# Patient Record
Sex: Male | Born: 1950 | Race: White | Hispanic: No | Marital: Single | Smoking: Former smoker
Health system: Southern US, Community
[De-identification: ages and names within clinical notes are randomized; demographics above are authoritative.]

## PROBLEM LIST (undated history)

## (undated) DIAGNOSIS — A419 Sepsis, unspecified organism: Secondary | ICD-10-CM

## (undated) DIAGNOSIS — E785 Hyperlipidemia, unspecified: Secondary | ICD-10-CM

## (undated) DIAGNOSIS — I509 Heart failure, unspecified: Secondary | ICD-10-CM

## (undated) DIAGNOSIS — R6520 Severe sepsis without septic shock: Secondary | ICD-10-CM

## (undated) DIAGNOSIS — E119 Type 2 diabetes mellitus without complications: Secondary | ICD-10-CM

## (undated) DIAGNOSIS — E870 Hyperosmolality and hypernatremia: Secondary | ICD-10-CM

## (undated) DIAGNOSIS — I10 Essential (primary) hypertension: Secondary | ICD-10-CM

## (undated) DIAGNOSIS — M1990 Unspecified osteoarthritis, unspecified site: Secondary | ICD-10-CM

## (undated) DIAGNOSIS — T07XXXA Unspecified multiple injuries, initial encounter: Secondary | ICD-10-CM

## (undated) DIAGNOSIS — M75100 Unspecified rotator cuff tear or rupture of unspecified shoulder, not specified as traumatic: Secondary | ICD-10-CM

## (undated) DIAGNOSIS — J449 Chronic obstructive pulmonary disease, unspecified: Secondary | ICD-10-CM

## (undated) HISTORY — DX: Hyperlipidemia, unspecified: E78.5

## (undated) HISTORY — DX: Essential (primary) hypertension: I10

## (undated) HISTORY — PX: OTHER SURGICAL HISTORY: SHX169

## (undated) HISTORY — DX: Heart failure, unspecified: I50.9

## (undated) HISTORY — DX: Chronic obstructive pulmonary disease, unspecified: J44.9

## (undated) HISTORY — PX: NO PAST SURGERIES: SHX2092

---

## 2008-03-22 ENCOUNTER — Emergency Department (HOSPITAL_COMMUNITY): Admission: EM | Admit: 2008-03-22 | Discharge: 2008-03-22 | Payer: Self-pay | Admitting: Emergency Medicine

## 2011-06-05 LAB — CBC
MCHC: 35.3
RDW: 14.5
WBC: 9.7

## 2011-06-05 LAB — URIC ACID: Uric Acid, Serum: 6.7

## 2011-06-05 LAB — DIFFERENTIAL
Basophils Absolute: 0.1
Basophils Relative: 1
Lymphocytes Relative: 20
Neutro Abs: 6.8
Neutrophils Relative %: 70

## 2014-07-05 ENCOUNTER — Telehealth: Payer: Self-pay

## 2014-07-16 NOTE — Telephone Encounter (Signed)
No msg °

## 2014-11-29 ENCOUNTER — Ambulatory Visit: Payer: Self-pay

## 2014-11-29 ENCOUNTER — Ambulatory Visit (INDEPENDENT_AMBULATORY_CARE_PROVIDER_SITE_OTHER): Payer: Worker's Compensation | Admitting: Emergency Medicine

## 2014-11-29 VITALS — BP 130/82 | HR 69 | Temp 98.4°F | Resp 20 | Ht 70.5 in | Wt 360.2 lb

## 2014-11-29 DIAGNOSIS — Z01818 Encounter for other preprocedural examination: Secondary | ICD-10-CM

## 2014-11-29 DIAGNOSIS — Z6841 Body Mass Index (BMI) 40.0 and over, adult: Secondary | ICD-10-CM | POA: Insufficient documentation

## 2014-11-29 DIAGNOSIS — Z720 Tobacco use: Secondary | ICD-10-CM | POA: Insufficient documentation

## 2014-11-29 LAB — POCT CBC
GRANULOCYTE PERCENT: 71.9 % (ref 37–80)
HEMATOCRIT: 56.5 % — AB (ref 43.5–53.7)
HEMOGLOBIN: 18 g/dL (ref 14.1–18.1)
LYMPH, POC: 2.1 (ref 0.6–3.4)
MCH, POC: 31.2 pg (ref 27–31.2)
MCHC: 31.9 g/dL (ref 31.8–35.4)
MCV: 98 fL — AB (ref 80–97)
MID (cbc): 0.5 (ref 0–0.9)
MPV: 7.6 fL (ref 0–99.8)
POC GRANULOCYTE: 6.7 (ref 2–6.9)
POC LYMPH PERCENT: 23 %L (ref 10–50)
POC MID %: 5.1 %M (ref 0–12)
Platelet Count, POC: 233 10*3/uL (ref 142–424)
RBC: 5.77 M/uL (ref 4.69–6.13)
RDW, POC: 17.3 %
WBC: 9.3 10*3/uL (ref 4.6–10.2)

## 2014-11-29 LAB — GLUCOSE, POCT (MANUAL RESULT ENTRY): POC GLUCOSE: 106 mg/dL — AB (ref 70–99)

## 2014-11-29 LAB — POCT GLYCOSYLATED HEMOGLOBIN (HGB A1C): HEMOGLOBIN A1C: 6.3

## 2014-11-29 NOTE — Progress Notes (Signed)
Subjective:    Patient ID: [name], male    DOB: 09/20/1950, 64 y.o.   MRN: [id number]  HPI  This is a 64 year old male here for pre-op clearance. He tore his right rotator cuff at work. He was seen here initially but then referred to [facility]. Dr. Thomasena Edis will be performing the surgery.  No PMH. He is not on any medications. He has a 44 pack year smoking history. Does not see a doctor regularly - has never had a CPE. States he does not exercise but does climb stairs a few times a day and is on his feet all day. Never gets SOB or CP. Has a baseline morning cough, coughs up mucous. No SOB or coughing throughout the day. No daytime tiredness. No snoring. No one sleeps in the bed with him. Denies LE swelling. Endorses he drinks and urinates a lot. Never been tested for DM.  Review of Systems  Constitutional: Negative for fever, chills and fatigue.  Eyes: Negative for visual disturbance.  Respiratory: Positive for cough. Negative for shortness of breath and wheezing.   Cardiovascular: Negative for chest pain, palpitations and leg swelling.  Gastrointestinal: Negative for nausea, vomiting and anal bleeding.  Endocrine: Positive for polydipsia and polyuria.  Musculoskeletal: Positive for arthralgias.  Psychiatric/Behavioral: Negative for sleep disturbance.    There are no active problems to display for this patient.  Prior to Admission medications   Medication Sig Start Date End Date Taking? Authorizing Provider  ibuprofen (ADVIL,MOTRIN) 200 MG tablet Take 200 mg by mouth every 6 (six) hours as needed.   Yes Historical Provider, MD   No Known Allergies  Patient's social and family history were reviewed.     Objective:   Physical Exam  Constitutional: He is oriented to person, place, and time. He appears well-developed and well-nourished. No distress.  HENT:  Head: Normocephalic and atraumatic.  Right Ear: Hearing normal.  Left Ear: Hearing normal.  Nose: Nose  normal.  Mouth/Throat: Uvula is midline, oropharynx is clear and moist and mucous membranes are normal.  Eyes: Conjunctivae and lids are normal. Right eye exhibits no discharge. Left eye exhibits no discharge. No scleral icterus.  Cardiovascular: Normal rate, regular rhythm, normal heart sounds and normal pulses.   Pulmonary/Chest: Effort normal and breath sounds normal. No respiratory distress. He has no wheezes. He has no rhonchi. He has no rales.  Abdominal: Soft. Normal appearance. There is no tenderness.  Musculoskeletal: Normal range of motion.  Lymphadenopathy:    He has no cervical adenopathy.  Neurological: He is alert and oriented to person, place, and time. Gait normal.  Skin: Skin is warm, dry and intact.  Large pedunculated lesion on left lower back.  2+ pitting edema in bilateral LE up to below knee  Severe onychomycosis and toenail overgrowth  Psychiatric: He has a normal mood and affect. His speech is normal and behavior is normal. Thought content normal.   BP 130/82 mmHg  Pulse 69  Temp(Src) 98.4 F (36.9 C) (Oral)  Resp 20  Ht 5' 10.5" (1.791 m)  Wt 360 lb 3.2 oz (163.386 kg)  BMI 50.94 kg/m2  SpO2 93%  Results for orders placed or performed in visit on 11/29/14  POCT CBC  Result Value Ref Range   WBC 9.3 4.6 - 10.2 K/uL   Lymph, poc 2.1 0.6 - 3.4   POC LYMPH PERCENT 23.0 10 - 50 %L   MID (cbc) 0.5 0 - 0.9   POC MID %  5.1 0 - 12 %M   POC Granulocyte 6.7 2 - 6.9   Granulocyte percent 71.9 37 - 80 %G   RBC 5.77 4.69 - 6.13 M/uL   Hemoglobin 18.0 14.1 - 18.1 g/dL   HCT, POC 16.156.5 (A) 09.643.5 - 53.7 %   MCV 98.0 (A) 80 - 97 fL   MCH, POC 31.2 27 - 31.2 pg   MCHC 31.9 31.8 - 35.4 g/dL   RDW, POC 04.517.3 %   Platelet Count, POC 233 142 - 424 K/uL   MPV 7.6 0 - 99.8 fL  POCT glycosylated hemoglobin (Hb A1C)  Result Value Ref Range   Hemoglobin A1C 6.3   POCT glucose (manual entry)  Result Value Ref Range   POC Glucose 106 (A) 70 - 99 mg/dl   EKG interpreted  with Dr. Dareen PianoAnderson: NSR with right ventricular hypertrophy suggestive of pulmonary disease.     Assessment & Plan:  1. Pre-operative clearance Hgb A1C borderline at 6.3. CBC wnl. Glucose 106. CMP pending. EKG without evidence of ischemia. He has a 44 pack year smoking history. He does not have CP or SOB with exertion although his exertion is limited to walking up stairs at work. BP not elevated. He is cleared for surgery with respects to EKG and labs performed today. Discussed case with Dr. Dareen PianoAnderson and he agrees. - POCT CBC - POCT glycosylated hemoglobin (Hb A1C) - POCT glucose (manual entry) - EKG 12-Lead   Roswell MinersNicole V. Dyke BrackettBush, PA-C, MHS [facility]  11/29/2014

## 2014-11-29 NOTE — Progress Notes (Signed)
  Medical screening examination/treatment/procedure(s) were performed by non-physician practitioner and as supervising physician I was immediately available for consultation/collaboration.    Suggest patient undergo colonoscopy

## 2014-12-19 NOTE — Progress Notes (Signed)
Please put orders in Epic surgery 12-28-14 pre op 12-25-14 Thanks 

## 2014-12-20 ENCOUNTER — Other Ambulatory Visit: Payer: Self-pay | Admitting: Orthopedic Surgery

## 2014-12-24 NOTE — Patient Instructions (Addendum)
YOUR PROCEDURE IS SCHEDULED ON : 12/28/14  REPORT TO [facility] MAIN ENTRANCE FOLLOW SIGNS TO SHORT STAY CENTER AT :  11:00 AM  CALL THIS NUMBER IF YOU HAVE PROBLEMS THE MORNING OF SURGERY [phone]  REMEMBER:  DO NOT EAT FOOD  AFTER MIDNIGHT MAY HAVE CLEAR LIQUIDS UNTIL 7:00 AM  CLEAR LIQUID DIET   Foods Allowed                                                                     Foods Excluded  Coffee and tea, regular and decaf                             liquids that you cannot  Plain Jell-O in any flavor                                             see through such as: Fruit ices (not with fruit pulp)                                     milk, soups, orange juice  Iced Popsicles                                                All solid food Carbonated beverages, regular and diet                                    Cranberry, grape and apple juices Sports drinks like Gatorade Lightly seasoned clear broth or consume(fat free) Sugar, honey syrup   TAKE THESE MEDICINES THE MORNING OF SURGERY: none  YOU MAY NOT HAVE ANY METAL ON YOUR BODY INCLUDING HAIR PINS AND PIERCING'S. DO NOT WEAR JEWELRY, MAKEUP, LOTIONS, POWDERS OR PERFUMES. DO NOT WEAR NAIL POLISH. DO NOT SHAVE 48 HRS PRIOR TO SURGERY. MEN MAY SHAVE FACE AND NECK.  DO NOT BRING VALUABLES TO HOSPITAL. [facility] IS NOT RESPONSIBLE FOR VALUABLES.  CONTACTS, DENTURES OR PARTIALS MAY NOT BE WORN TO SURGERY. LEAVE SUITCASE IN CAR. CAN BE BROUGHT TO ROOM AFTER SURGERY.  PATIENTS DISCHARGED THE DAY OF SURGERY WILL NOT BE ALLOWED TO DRIVE HOME.  PLEASE READ OVER THE FOLLOWING INSTRUCTION SHEETS _________________________________________________________________________________                                          [facility] - PREPARING FOR SURGERY  Before surgery, you can play an important role.  Because skin is not sterile, your skin needs to be as free of germs as possible.  You can reduce the  number of germs on your skin by washing with CHG (chlorahexidine gluconate) soap before surgery.  CHG is an antiseptic cleaner  which kills germs and bonds with the skin to continue killing germs even after washing. Please DO NOT use if you have an allergy to CHG or antibacterial soaps.  If your skin becomes reddened/irritated stop using the CHG and inform your nurse when you arrive at Short Stay. Do not shave (including legs and underarms) for at least 48 hours prior to the first CHG shower.  You may shave your face. Please follow these instructions carefully:   1.  Shower with CHG Soap the night before surgery and the  morning of Surgery.   2.  If you choose to wash your hair, wash your hair first as usual with your  normal  Shampoo.   3.  After you shampoo, rinse your hair and body thoroughly to remove the  shampoo.                                         4.  Use CHG as you would any other liquid soap.  You can apply chg directly  to the skin and wash . Gently wash with scrungie or clean wascloth    5.  Apply the CHG Soap to your body ONLY FROM THE NECK DOWN.   Do not use on open                           Wound or open sores. Avoid contact with eyes, ears mouth and genitals (private parts).                        Genitals (private parts) with your normal soap.              6.  Wash thoroughly, paying special attention to the area where your surgery  will be performed.   7.  Thoroughly rinse your body with warm water from the neck down.   8.  DO NOT shower/wash with your normal soap after using and rinsing off  the CHG Soap .                9.  Pat yourself dry with a clean towel.             10.  Wear clean night clothes to bed after shower             11.  Place clean sheets on your bed the night of your first shower and do not  sleep with pets.  Day of Surgery : Do not apply any lotions/deodorants the morning of surgery.  Please wear clean clothes to the hospital/surgery  center.  FAILURE TO FOLLOW THESE INSTRUCTIONS MAY RESULT IN THE CANCELLATION OF YOUR SURGERY    PATIENT SIGNATURE_________________________________  ______________________________________________________________________   [name]  An incentive spirometer is a tool that can help keep your lungs clear and active. This tool measures how well you are filling your lungs with each breath. Taking long deep breaths may help reverse or decrease the chance of developing breathing (pulmonary) problems (especially infection) following:  A long period of time when you are unable to move or be active. BEFORE THE PROCEDURE   If the spirometer includes an indicator to show your best effort, your nurse or respiratory therapist will set it to a desired goal.  If possible, sit up straight or lean slightly forward. Try not to  slouch.  Hold the incentive spirometer in an upright position. INSTRUCTIONS FOR USE  1. Sit on the edge of your bed if possible, or sit up as far as you can in bed or on a chair. 2. Hold the incentive spirometer in an upright position. 3. Breathe out normally. 4. Place the mouthpiece in your mouth and seal your lips tightly around it. 5. Breathe in slowly and as deeply as possible, raising the piston or the ball toward the top of the column. 6. Hold your breath for 3-5 seconds or for as long as possible. Allow the piston or ball to fall to the bottom of the column. 7. Remove the mouthpiece from your mouth and breathe out normally. 8. Rest for a few seconds and repeat Steps 1 through 7 at least 10 times every 1-2 hours when you are awake. Take your time and take a few normal breaths between deep breaths. 9. The spirometer may include an indicator to show your best effort. Use the indicator as a goal to work toward during each repetition. 10. After each set of 10 deep breaths, practice coughing to be sure your lungs are clear. If you have an incision (the cut made at the  time of surgery), support your incision when coughing by placing a pillow or rolled up towels firmly against it. Once you are able to get out of bed, walk around indoors and cough well. You may stop using the incentive spirometer when instructed by your caregiver.  RISKS AND COMPLICATIONS  Take your time so you do not get dizzy or light-headed.  If you are in pain, you may need to take or ask for pain medication before doing incentive spirometry. It is harder to take a deep breath if you are having pain. AFTER USE  Rest and breathe slowly and easily.  It can be helpful to keep track of a log of your progress. Your caregiver can provide you with a simple table to help with this. If you are using the spirometer at home, follow these instructions: SEEK MEDICAL CARE IF:   You are having difficultly using the spirometer.  You have trouble using the spirometer as often as instructed.  Your pain medication is not giving enough relief while using the spirometer.  You develop fever of 100.5 F (38.1 C) or higher. SEEK IMMEDIATE MEDICAL CARE IF:   You cough up bloody sputum that had not been present before.  You develop fever of 102 F (38.9 C) or greater.  You develop worsening pain at or near the incision site. MAKE SURE YOU:   Understand these instructions.  Will watch your condition.  Will get help right away if you are not doing well or get worse. Document Released: 01/04/2007 Document Revised: 11/16/2011 Document Reviewed: 03/07/2007 [facility] Patient Information 2014 [place], [place].   ________________________________________________________________________

## 2014-12-25 ENCOUNTER — Encounter (HOSPITAL_COMMUNITY): Payer: Self-pay

## 2014-12-25 ENCOUNTER — Encounter (HOSPITAL_COMMUNITY)
Admission: RE | Admit: 2014-12-25 | Discharge: 2014-12-25 | Disposition: A | Discharge status: Home / Self Care | Payer: Worker's Compensation | Source: Ambulatory Visit | Attending: Specialist | Admitting: Specialist

## 2014-12-25 DIAGNOSIS — M19011 Primary osteoarthritis, right shoulder: Secondary | ICD-10-CM | POA: Insufficient documentation

## 2014-12-25 DIAGNOSIS — Z01818 Encounter for other preprocedural examination: Secondary | ICD-10-CM | POA: Insufficient documentation

## 2014-12-25 HISTORY — DX: Unspecified rotator cuff tear or rupture of unspecified shoulder, not specified as traumatic: M75.100

## 2014-12-25 HISTORY — DX: Unspecified osteoarthritis, unspecified site: M19.90

## 2014-12-25 HISTORY — DX: Unspecified multiple injuries, initial encounter: T07.XXXA

## 2014-12-25 LAB — CBC
HEMATOCRIT: 54.2 % — AB (ref 39.0–52.0)
HEMOGLOBIN: 17.2 g/dL — AB (ref 13.0–17.0)
MCH: 31.6 pg (ref 26.0–34.0)
MCHC: 31.7 g/dL (ref 30.0–36.0)
MCV: 99.6 fL (ref 78.0–100.0)
PLATELETS: 179 10*3/uL (ref 150–400)
RBC: 5.44 MIL/uL (ref 4.22–5.81)
RDW: 15.8 % — AB (ref 11.5–15.5)
WBC: 9.8 10*3/uL (ref 4.0–10.5)

## 2014-12-25 LAB — BASIC METABOLIC PANEL
Anion gap: 6 (ref 5–15)
BUN: 11 mg/dL (ref 6–23)
CHLORIDE: 103 mmol/L (ref 96–112)
CO2: 32 mmol/L (ref 19–32)
CREATININE: 0.87 mg/dL (ref 0.50–1.35)
Calcium: 8.8 mg/dL (ref 8.4–10.5)
GFR calc non Af Amer: >90 mL/min (ref >90)
Glucose, Bld: 179 mg/dL — ABNORMAL HIGH (ref 70–99)
Potassium: 4.1 mmol/L (ref 3.5–5.1)
Sodium: 141 mmol/L (ref 135–145)

## 2014-12-25 NOTE — Progress Notes (Signed)
   12/25/14 1415  OBSTRUCTIVE SLEEP APNEA  Have you ever been diagnosed with sleep apnea through a sleep study? No  Do you snore loudly (loud enough to be heard through closed doors)?  0  Do you often feel tired, fatigued, or sleepy during the daytime? 0  Has anyone observed you stop breathing during your sleep? 0  Do you have, or are you being treated for high blood pressure? 0  BMI more than 35 kg/m2? 1  Age over 18110 years old? 1  Neck circumference greater than 40 cm/16 inches? 1  Gender: 1  Obstructive Sleep Apnea Score 4

## 2014-12-27 MED ORDER — CEFAZOLIN SODIUM 10 G IJ SOLR
3.0000 g | INTRAMUSCULAR | Status: AC
  Administered 2014-12-28: 3 g via INTRAVENOUS
  Filled 2014-12-27: qty 3000

## 2014-12-28 ENCOUNTER — Inpatient Hospital Stay (HOSPITAL_COMMUNITY): Payer: Worker's Compensation

## 2014-12-28 ENCOUNTER — Ambulatory Visit (HOSPITAL_COMMUNITY): Payer: Worker's Compensation | Admitting: Certified Registered Nurse Anesthetist

## 2014-12-28 ENCOUNTER — Encounter (HOSPITAL_COMMUNITY)
Admission: RE | Disposition: A | Discharge status: Nursing Home (Includes ICF) | Payer: Self-pay | Source: Ambulatory Visit | Attending: Pulmonary Disease

## 2014-12-28 ENCOUNTER — Encounter (HOSPITAL_COMMUNITY): Payer: Self-pay | Admitting: *Deleted

## 2014-12-28 ENCOUNTER — Inpatient Hospital Stay (HOSPITAL_COMMUNITY)
Admission: RE | Admit: 2014-12-28 | Discharge: 2015-01-19 | DRG: 003 | Disposition: A | Discharge status: Other Acute Inpatient Hospital | Payer: Worker's Compensation | Source: Ambulatory Visit | Attending: Pulmonary Disease | Admitting: Pulmonary Disease

## 2014-12-28 DIAGNOSIS — I82433 Acute embolism and thrombosis of popliteal vein, bilateral: Secondary | ICD-10-CM

## 2014-12-28 DIAGNOSIS — Z930 Tracheostomy status: Secondary | ICD-10-CM

## 2014-12-28 DIAGNOSIS — R1110 Vomiting, unspecified: Secondary | ICD-10-CM

## 2014-12-28 DIAGNOSIS — I82443 Acute embolism and thrombosis of tibial vein, bilateral: Secondary | ICD-10-CM

## 2014-12-28 DIAGNOSIS — G9341 Metabolic encephalopathy: Secondary | ICD-10-CM | POA: Diagnosis not present

## 2014-12-28 DIAGNOSIS — N179 Acute kidney failure, unspecified: Secondary | ICD-10-CM | POA: Diagnosis not present

## 2014-12-28 DIAGNOSIS — I472 Ventricular tachycardia: Secondary | ICD-10-CM | POA: Diagnosis not present

## 2014-12-28 DIAGNOSIS — Z791 Long term (current) use of non-steroidal anti-inflammatories (NSAID): Secondary | ICD-10-CM | POA: Diagnosis not present

## 2014-12-28 DIAGNOSIS — I82413 Acute embolism and thrombosis of femoral vein, bilateral: Secondary | ICD-10-CM

## 2014-12-28 DIAGNOSIS — J384 Edema of larynx: Secondary | ICD-10-CM | POA: Diagnosis not present

## 2014-12-28 DIAGNOSIS — J95821 Acute postprocedural respiratory failure: Secondary | ICD-10-CM | POA: Diagnosis not present

## 2014-12-28 DIAGNOSIS — J9690 Respiratory failure, unspecified, unspecified whether with hypoxia or hypercapnia: Secondary | ICD-10-CM

## 2014-12-28 DIAGNOSIS — Z9889 Other specified postprocedural states: Secondary | ICD-10-CM | POA: Diagnosis not present

## 2014-12-28 DIAGNOSIS — J189 Pneumonia, unspecified organism: Secondary | ICD-10-CM | POA: Diagnosis not present

## 2014-12-28 DIAGNOSIS — I82403 Acute embolism and thrombosis of unspecified deep veins of lower extremity, bilateral: Secondary | ICD-10-CM

## 2014-12-28 DIAGNOSIS — Z430 Encounter for attention to tracheostomy: Secondary | ICD-10-CM | POA: Insufficient documentation

## 2014-12-28 DIAGNOSIS — I5032 Chronic diastolic (congestive) heart failure: Secondary | ICD-10-CM | POA: Diagnosis present

## 2014-12-28 DIAGNOSIS — S46111A Strain of muscle, fascia and tendon of long head of biceps, right arm, initial encounter: Secondary | ICD-10-CM | POA: Diagnosis present

## 2014-12-28 DIAGNOSIS — F17210 Nicotine dependence, cigarettes, uncomplicated: Secondary | ICD-10-CM | POA: Diagnosis present

## 2014-12-28 DIAGNOSIS — E876 Hypokalemia: Secondary | ICD-10-CM | POA: Diagnosis not present

## 2014-12-28 DIAGNOSIS — M25511 Pain in right shoulder: Secondary | ICD-10-CM | POA: Diagnosis present

## 2014-12-28 DIAGNOSIS — R1310 Dysphagia, unspecified: Secondary | ICD-10-CM | POA: Diagnosis not present

## 2014-12-28 DIAGNOSIS — I10 Essential (primary) hypertension: Secondary | ICD-10-CM

## 2014-12-28 DIAGNOSIS — J9811 Atelectasis: Secondary | ICD-10-CM | POA: Diagnosis not present

## 2014-12-28 DIAGNOSIS — Y838 Other surgical procedures as the cause of abnormal reaction of the patient, or of later complication, without mention of misadventure at the time of the procedure: Secondary | ICD-10-CM | POA: Diagnosis not present

## 2014-12-28 DIAGNOSIS — S43421A Sprain of right rotator cuff capsule, initial encounter: Principal | ICD-10-CM | POA: Diagnosis present

## 2014-12-28 DIAGNOSIS — T884XXA Failed or difficult intubation, initial encounter: Secondary | ICD-10-CM

## 2014-12-28 DIAGNOSIS — E870 Hyperosmolality and hypernatremia: Secondary | ICD-10-CM | POA: Diagnosis not present

## 2014-12-28 DIAGNOSIS — Y92239 Unspecified place in hospital as the place of occurrence of the external cause: Secondary | ICD-10-CM

## 2014-12-28 DIAGNOSIS — E872 Acidosis: Secondary | ICD-10-CM | POA: Diagnosis not present

## 2014-12-28 DIAGNOSIS — E781 Pure hyperglyceridemia: Secondary | ICD-10-CM | POA: Diagnosis present

## 2014-12-28 DIAGNOSIS — D649 Anemia, unspecified: Secondary | ICD-10-CM | POA: Diagnosis present

## 2014-12-28 DIAGNOSIS — J9601 Acute respiratory failure with hypoxia: Secondary | ICD-10-CM | POA: Diagnosis not present

## 2014-12-28 DIAGNOSIS — A419 Sepsis, unspecified organism: Secondary | ICD-10-CM | POA: Diagnosis not present

## 2014-12-28 DIAGNOSIS — M66811 Spontaneous rupture of other tendons, right shoulder: Secondary | ICD-10-CM | POA: Diagnosis present

## 2014-12-28 DIAGNOSIS — Z23 Encounter for immunization: Secondary | ICD-10-CM

## 2014-12-28 DIAGNOSIS — M1990 Unspecified osteoarthritis, unspecified site: Secondary | ICD-10-CM | POA: Diagnosis present

## 2014-12-28 DIAGNOSIS — Z79891 Long term (current) use of opiate analgesic: Secondary | ICD-10-CM | POA: Diagnosis not present

## 2014-12-28 DIAGNOSIS — D7589 Other specified diseases of blood and blood-forming organs: Secondary | ICD-10-CM | POA: Diagnosis present

## 2014-12-28 DIAGNOSIS — E875 Hyperkalemia: Secondary | ICD-10-CM | POA: Diagnosis not present

## 2014-12-28 DIAGNOSIS — D751 Secondary polycythemia: Secondary | ICD-10-CM | POA: Diagnosis present

## 2014-12-28 DIAGNOSIS — I248 Other forms of acute ischemic heart disease: Secondary | ICD-10-CM | POA: Diagnosis present

## 2014-12-28 DIAGNOSIS — Z4659 Encounter for fitting and adjustment of other gastrointestinal appliance and device: Secondary | ICD-10-CM

## 2014-12-28 DIAGNOSIS — Z6841 Body Mass Index (BMI) 40.0 and over, adult: Secondary | ICD-10-CM

## 2014-12-28 DIAGNOSIS — Y95 Nosocomial condition: Secondary | ICD-10-CM | POA: Diagnosis not present

## 2014-12-28 DIAGNOSIS — M7989 Other specified soft tissue disorders: Secondary | ICD-10-CM | POA: Diagnosis not present

## 2014-12-28 DIAGNOSIS — E662 Morbid (severe) obesity with alveolar hypoventilation: Secondary | ICD-10-CM | POA: Diagnosis present

## 2014-12-28 DIAGNOSIS — I5031 Acute diastolic (congestive) heart failure: Secondary | ICD-10-CM | POA: Diagnosis not present

## 2014-12-28 DIAGNOSIS — I160 Hypertensive urgency: Secondary | ICD-10-CM | POA: Diagnosis present

## 2014-12-28 DIAGNOSIS — R58 Hemorrhage, not elsewhere classified: Secondary | ICD-10-CM

## 2014-12-28 DIAGNOSIS — R6521 Severe sepsis with septic shock: Secondary | ICD-10-CM | POA: Diagnosis not present

## 2014-12-28 DIAGNOSIS — I509 Heart failure, unspecified: Secondary | ICD-10-CM | POA: Diagnosis not present

## 2014-12-28 DIAGNOSIS — G9340 Encephalopathy, unspecified: Secondary | ICD-10-CM | POA: Diagnosis present

## 2014-12-28 DIAGNOSIS — J9600 Acute respiratory failure, unspecified whether with hypoxia or hypercapnia: Secondary | ICD-10-CM | POA: Diagnosis present

## 2014-12-28 DIAGNOSIS — N170 Acute kidney failure with tubular necrosis: Secondary | ICD-10-CM | POA: Diagnosis not present

## 2014-12-28 DIAGNOSIS — W19XXXA Unspecified fall, initial encounter: Secondary | ICD-10-CM | POA: Diagnosis present

## 2014-12-28 DIAGNOSIS — N186 End stage renal disease: Secondary | ICD-10-CM

## 2014-12-28 DIAGNOSIS — N508 Other specified disorders of male genital organs: Secondary | ICD-10-CM | POA: Diagnosis not present

## 2014-12-28 DIAGNOSIS — J449 Chronic obstructive pulmonary disease, unspecified: Secondary | ICD-10-CM | POA: Diagnosis present

## 2014-12-28 DIAGNOSIS — K5900 Constipation, unspecified: Secondary | ICD-10-CM | POA: Diagnosis not present

## 2014-12-28 DIAGNOSIS — N5089 Other specified disorders of the male genital organs: Secondary | ICD-10-CM

## 2014-12-28 DIAGNOSIS — Z992 Dependence on renal dialysis: Secondary | ICD-10-CM

## 2014-12-28 DIAGNOSIS — I82509 Chronic embolism and thrombosis of unspecified deep veins of unspecified lower extremity: Secondary | ICD-10-CM | POA: Insufficient documentation

## 2014-12-28 DIAGNOSIS — J9602 Acute respiratory failure with hypercapnia: Secondary | ICD-10-CM | POA: Diagnosis not present

## 2014-12-28 DIAGNOSIS — J95830 Postprocedural hemorrhage and hematoma of a respiratory system organ or structure following a respiratory system procedure: Secondary | ICD-10-CM | POA: Diagnosis not present

## 2014-12-28 DIAGNOSIS — R739 Hyperglycemia, unspecified: Secondary | ICD-10-CM | POA: Diagnosis not present

## 2014-12-28 DIAGNOSIS — Z720 Tobacco use: Secondary | ICD-10-CM

## 2014-12-28 DIAGNOSIS — R0902 Hypoxemia: Secondary | ICD-10-CM

## 2014-12-28 HISTORY — DX: Morbid (severe) obesity due to excess calories: E66.01

## 2014-12-28 HISTORY — PX: SHOULDER ARTHROSCOPY WITH SUBACROMIAL DECOMPRESSION, ROTATOR CUFF REPAIR AND BICEP TENDON REPAIR: SHX5687

## 2014-12-28 LAB — BLOOD GAS, ARTERIAL
Acid-base deficit: 2.4 mmol/L — ABNORMAL HIGH (ref 0.0–2.0)
Bicarbonate: 28.8 mEq/L — ABNORMAL HIGH (ref 20.0–24.0)
Delivery systems: POSITIVE
Drawn by: 235321
Expiratory PAP: 8
FIO2: 1 %
Inspiratory PAP: 14
Mode: POSITIVE
O2 Saturation: 93.3 %
Patient temperature: 98.1
TCO2: 25.9 mmol/L (ref 0–100)
pCO2 arterial: 77.2 mmHg (ref 35.0–45.0)
pH, Arterial: 7.194 — CL (ref 7.350–7.450)
pO2, Arterial: 76.3 mmHg — ABNORMAL LOW (ref 80.0–100.0)

## 2014-12-28 LAB — BRAIN NATRIURETIC PEPTIDE: B Natriuretic Peptide: 55.7 pg/mL (ref 0.0–100.0)

## 2014-12-28 LAB — TROPONIN I: Troponin I: 0.05 ng/mL — ABNORMAL HIGH (ref <0.031)

## 2014-12-28 LAB — GLUCOSE, CAPILLARY: GLUCOSE-CAPILLARY: 171 mg/dL — AB (ref 70–99)

## 2014-12-28 SURGERY — SHOULDER ARTHROSCOPY WITH SUBACROMIAL DECOMPRESSION, ROTATOR CUFF REPAIR AND BICEP TENDON REPAIR
Anesthesia: General | Site: Shoulder | Laterality: Right

## 2014-12-28 MED ORDER — OXYCODONE-ACETAMINOPHEN 5-325 MG PO TABS: 1.0000 | ORAL_TABLET | ORAL | Status: DC | PRN

## 2014-12-28 MED ORDER — SUCCINYLCHOLINE CHLORIDE 20 MG/ML IJ SOLN
INTRAMUSCULAR | Status: DC | PRN
  Administered 2014-12-28: 160 mg via INTRAVENOUS

## 2014-12-28 MED ORDER — PROPOFOL 1000 MG/100ML IV EMUL
INTRAVENOUS | Status: AC
  Filled 2014-12-28: qty 100

## 2014-12-28 MED ORDER — OXYCODONE HCL 5 MG PO TABS: 5.0000 mg | ORAL_TABLET | ORAL | Status: DC | PRN

## 2014-12-28 MED ORDER — LACTATED RINGERS IV SOLN: INTRAVENOUS | Status: DC

## 2014-12-28 MED ORDER — ROPIVACAINE HCL 5 MG/ML IJ SOLN
INTRAMUSCULAR | Status: DC | PRN
Start: 2014-12-28 — End: 2014-12-28
  Administered 2014-12-28: 30 mL via PERINEURAL

## 2014-12-28 MED ORDER — ROCURONIUM BROMIDE 100 MG/10ML IV SOLN
INTRAVENOUS | Status: AC
  Filled 2014-12-28: qty 1

## 2014-12-28 MED ORDER — METHOCARBAMOL 1000 MG/10ML IJ SOLN
500.0000 mg | Freq: Four times a day (QID) | INTRAVENOUS | Status: DC | PRN
  Filled 2014-12-28: qty 5

## 2014-12-28 MED ORDER — FENTANYL CITRATE (PF) 100 MCG/2ML IJ SOLN
INTRAMUSCULAR | Status: DC | PRN
  Administered 2014-12-28: 50 ug via INTRAVENOUS

## 2014-12-28 MED ORDER — INSULIN ASPART 100 UNIT/ML ~~LOC~~ SOLN
0.0000 [IU] | SUBCUTANEOUS | Status: DC
  Administered 2014-12-28: 2 [IU] via SUBCUTANEOUS
  Administered 2014-12-29: 3 [IU] via SUBCUTANEOUS
  Administered 2014-12-29 (×2): 2 [IU] via SUBCUTANEOUS

## 2014-12-28 MED ORDER — RACEPINEPHRINE HCL 2.25 % IN NEBU
0.5000 mL | INHALATION_SOLUTION | Freq: Once | RESPIRATORY_TRACT | Status: AC
  Administered 2014-12-28: 0.5 mL via RESPIRATORY_TRACT
  Filled 2014-12-28: qty 0.5

## 2014-12-28 MED ORDER — ASPIRIN EC 325 MG PO TBEC
325.0000 mg | DELAYED_RELEASE_TABLET | Freq: Every day | ORAL | Status: DC
Start: 2014-12-29 — End: 2014-12-29

## 2014-12-28 MED ORDER — HALOPERIDOL LACTATE 5 MG/ML IJ SOLN
5.0000 mg | Freq: Once | INTRAMUSCULAR | Status: AC
  Administered 2014-12-28: 5 mg via INTRAVENOUS

## 2014-12-28 MED ORDER — FENTANYL CITRATE (PF) 100 MCG/2ML IJ SOLN
INTRAMUSCULAR | Status: AC
  Filled 2014-12-28: qty 2

## 2014-12-28 MED ORDER — CEPHALEXIN 500 MG PO CAPS: 500.0000 mg | ORAL_CAPSULE | Freq: Four times a day (QID) | ORAL | Status: DC

## 2014-12-28 MED ORDER — ROCURONIUM BROMIDE 100 MG/10ML IV SOLN
INTRAVENOUS | Status: DC | PRN
  Administered 2014-12-28: 40 mg via INTRAVENOUS
  Administered 2014-12-28: 10 mg via INTRAVENOUS

## 2014-12-28 MED ORDER — ONDANSETRON HCL 4 MG PO TABS: 4.0000 mg | ORAL_TABLET | Freq: Four times a day (QID) | ORAL | Status: DC | PRN

## 2014-12-28 MED ORDER — LACTATED RINGERS IR SOLN
Status: DC | PRN
  Administered 2014-12-28 (×2): 3000 mL
  Administered 2014-12-28: 12000 mL
  Administered 2014-12-28: 3000 mL

## 2014-12-28 MED ORDER — MENTHOL 3 MG MT LOZG: 1.0000 | LOZENGE | OROMUCOSAL | Status: DC | PRN

## 2014-12-28 MED ORDER — NEOSTIGMINE METHYLSULFATE 10 MG/10ML IV SOLN
INTRAVENOUS | Status: DC | PRN
  Administered 2014-12-28: 3.5 mg via INTRAVENOUS

## 2014-12-28 MED ORDER — METHOCARBAMOL 500 MG PO TABS: 500.0000 mg | ORAL_TABLET | Freq: Four times a day (QID) | ORAL | Status: DC | PRN

## 2014-12-28 MED ORDER — SODIUM CHLORIDE 0.9 % IV SOLN
INTRAVENOUS | Status: DC
  Administered 2014-12-28 – 2014-12-30 (×2): 10 mL via INTRAVENOUS
  Administered 2014-12-30: 10 mL/h via INTRAVENOUS
  Administered 2015-01-03 – 2015-01-13 (×3): via INTRAVENOUS

## 2014-12-28 MED ORDER — BUPIVACAINE HCL (PF) 0.25 % IJ SOLN
INTRAMUSCULAR | Status: DC | PRN
  Administered 2014-12-28: 20 mL

## 2014-12-28 MED ORDER — ROPIVACAINE HCL 5 MG/ML IJ SOLN
INTRAMUSCULAR | Status: AC
  Filled 2014-12-28: qty 30

## 2014-12-28 MED ORDER — HYDROMORPHONE HCL 1 MG/ML IJ SOLN
0.2500 mg | INTRAMUSCULAR | Status: DC | PRN
  Administered 2014-12-28: 1 mg via INTRAVENOUS

## 2014-12-28 MED ORDER — ALBUTEROL SULFATE (2.5 MG/3ML) 0.083% IN NEBU
2.5000 mg | INHALATION_SOLUTION | Freq: Four times a day (QID) | RESPIRATORY_TRACT | Status: DC | PRN
  Administered 2014-12-28: 2.5 mg via RESPIRATORY_TRACT

## 2014-12-28 MED ORDER — ALUM & MAG HYDROXIDE-SIMETH 200-200-20 MG/5ML PO SUSP
30.0000 mL | ORAL | Status: DC | PRN
  Filled 2014-12-28: qty 30

## 2014-12-28 MED ORDER — MIDAZOLAM HCL 2 MG/2ML IJ SOLN
INTRAMUSCULAR | Status: AC
  Filled 2014-12-28: qty 2

## 2014-12-28 MED ORDER — ONDANSETRON HCL 4 MG/2ML IJ SOLN
4.0000 mg | Freq: Four times a day (QID) | INTRAMUSCULAR | Status: DC | PRN
  Administered 2015-01-17: 4 mg via INTRAVENOUS
  Filled 2014-12-28 (×2): qty 2

## 2014-12-28 MED ORDER — POVIDONE-IODINE 7.5 % EX SOLN: Freq: Once | CUTANEOUS | Status: DC

## 2014-12-28 MED ORDER — LIDOCAINE HCL (CARDIAC) 20 MG/ML IV SOLN
INTRAVENOUS | Status: AC
  Filled 2014-12-28: qty 5

## 2014-12-28 MED ORDER — HALOPERIDOL LACTATE 5 MG/ML IJ SOLN
INTRAMUSCULAR | Status: AC
  Filled 2014-12-28: qty 1

## 2014-12-28 MED ORDER — FENTANYL CITRATE (PF) 250 MCG/5ML IJ SOLN
INTRAMUSCULAR | Status: AC
  Filled 2014-12-28: qty 5

## 2014-12-28 MED ORDER — HYDROMORPHONE HCL 1 MG/ML IJ SOLN
1.0000 mg | INTRAMUSCULAR | Status: DC | PRN
  Administered 2014-12-28: 1 mg via INTRAVENOUS
  Filled 2014-12-28 (×2): qty 1

## 2014-12-28 MED ORDER — METOCLOPRAMIDE HCL 5 MG/ML IJ SOLN: 5.0000 mg | Freq: Three times a day (TID) | INTRAMUSCULAR | Status: DC | PRN

## 2014-12-28 MED ORDER — GLYCOPYRROLATE 0.2 MG/ML IJ SOLN
INTRAMUSCULAR | Status: DC | PRN
  Administered 2014-12-28: 0.6 mg via INTRAVENOUS

## 2014-12-28 MED ORDER — DIPHENHYDRAMINE HCL 12.5 MG/5ML PO ELIX: 12.5000 mg | ORAL_SOLUTION | ORAL | Status: DC | PRN

## 2014-12-28 MED ORDER — ACETAMINOPHEN 325 MG PO TABS
650.0000 mg | ORAL_TABLET | Freq: Four times a day (QID) | ORAL | Status: DC | PRN
  Administered 2015-01-02 – 2015-01-08 (×9): 650 mg via ORAL
  Filled 2014-12-28 (×9): qty 2

## 2014-12-28 MED ORDER — MIDAZOLAM HCL 10 MG/2ML IJ SOLN
2.0000 mg | Freq: Once | INTRAMUSCULAR | Status: AC
  Administered 2014-12-28: 2 mg via INTRAVENOUS

## 2014-12-28 MED ORDER — BUPIVACAINE-EPINEPHRINE (PF) 0.25% -1:200000 IJ SOLN
INTRAMUSCULAR | Status: AC
  Filled 2014-12-28: qty 30

## 2014-12-28 MED ORDER — BUPIVACAINE-EPINEPHRINE 0.25% -1:200000 IJ SOLN
INTRAMUSCULAR | Status: DC | PRN
  Administered 2014-12-28: 10 mL

## 2014-12-28 MED ORDER — BUPIVACAINE-EPINEPHRINE (PF) 0.5% -1:200000 IJ SOLN
INTRAMUSCULAR | Status: AC
  Filled 2014-12-28: qty 30

## 2014-12-28 MED ORDER — FENTANYL CITRATE (PF) 100 MCG/2ML IJ SOLN
50.0000 ug | Freq: Once | INTRAMUSCULAR | Status: AC
  Administered 2014-12-28: 50 ug via INTRAVENOUS

## 2014-12-28 MED ORDER — IPRATROPIUM-ALBUTEROL 0.5-2.5 (3) MG/3ML IN SOLN
3.0000 mL | RESPIRATORY_TRACT | Status: DC
  Administered 2014-12-28 – 2014-12-29 (×3): 3 mL via RESPIRATORY_TRACT
  Filled 2014-12-28 (×3): qty 3

## 2014-12-28 MED ORDER — CEFAZOLIN SODIUM-DEXTROSE 2-3 GM-% IV SOLR
2.0000 g | Freq: Four times a day (QID) | INTRAVENOUS | Status: AC
  Administered 2014-12-28 – 2014-12-29 (×3): 2 g via INTRAVENOUS
  Filled 2014-12-28 (×3): qty 50

## 2014-12-28 MED ORDER — PROPOFOL 10 MG/ML IV BOLUS
INTRAVENOUS | Status: DC | PRN
  Administered 2014-12-28: 200 mg via INTRAVENOUS

## 2014-12-28 MED ORDER — BACLOFEN 10 MG PO TABS: 10.0000 mg | ORAL_TABLET | Freq: Three times a day (TID) | ORAL | Status: DC | PRN

## 2014-12-28 MED ORDER — POTASSIUM CHLORIDE IN NACL 20-0.9 MEQ/L-% IV SOLN
INTRAVENOUS | Status: DC
  Administered 2014-12-28: 75 mL via INTRAVENOUS
  Filled 2014-12-28: qty 1000

## 2014-12-28 MED ORDER — ONDANSETRON HCL 4 MG/2ML IJ SOLN
INTRAMUSCULAR | Status: DC | PRN
  Administered 2014-12-28: 4 mg via INTRAVENOUS

## 2014-12-28 MED ORDER — ACETAMINOPHEN 650 MG RE SUPP
650.0000 mg | Freq: Four times a day (QID) | RECTAL | Status: DC | PRN
  Administered 2015-01-03: 650 mg via RECTAL
  Filled 2014-12-28 (×2): qty 1

## 2014-12-28 MED ORDER — PROPOFOL 10 MG/ML IV BOLUS
INTRAVENOUS | Status: AC
  Filled 2014-12-28: qty 20

## 2014-12-28 MED ORDER — LACTATED RINGERS IV SOLN
INTRAVENOUS | Status: DC | PRN
  Administered 2014-12-28 (×2): via INTRAVENOUS

## 2014-12-28 MED ORDER — HALOPERIDOL LACTATE 5 MG/ML IJ SOLN
2.5000 mg | Freq: Four times a day (QID) | INTRAMUSCULAR | Status: DC | PRN
  Filled 2014-12-28: qty 1

## 2014-12-28 MED ORDER — HYDROMORPHONE HCL 1 MG/ML IJ SOLN
INTRAMUSCULAR | Status: AC
  Filled 2014-12-28: qty 1

## 2014-12-28 MED ORDER — PHENOL 1.4 % MT LIQD: 1.0000 | OROMUCOSAL | Status: DC | PRN

## 2014-12-28 MED ORDER — HYDRALAZINE HCL 20 MG/ML IJ SOLN: 5.0000 mg | INTRAMUSCULAR | Status: DC | PRN

## 2014-12-28 MED ORDER — ALBUTEROL SULFATE (2.5 MG/3ML) 0.083% IN NEBU
INHALATION_SOLUTION | RESPIRATORY_TRACT | Status: AC
  Filled 2014-12-28: qty 3

## 2014-12-28 MED ORDER — METOCLOPRAMIDE HCL 5 MG PO TABS: 5.0000 mg | ORAL_TABLET | Freq: Three times a day (TID) | ORAL | Status: DC | PRN

## 2014-12-28 MED ORDER — BUPIVACAINE HCL (PF) 0.25 % IJ SOLN
INTRAMUSCULAR | Status: AC
  Filled 2014-12-28: qty 30

## 2014-12-28 MED ORDER — LIDOCAINE HCL (CARDIAC) 20 MG/ML IV SOLN
INTRAVENOUS | Status: DC | PRN
  Administered 2014-12-28: 50 mg via INTRAVENOUS

## 2014-12-28 SURGICAL SUPPLY — 62 items
ANCHOR BIO SWLOCK 4.75 W/TIG (Anchor) ×3 IMPLANT
ANCHOR SUT BIO SW 4.75 W/FIB (Anchor) ×3 IMPLANT
BAG ZIPLOCK 12X15 (MISCELLANEOUS) IMPLANT
BENZOIN TINCTURE PRP APPL 2/3 (GAUZE/BANDAGES/DRESSINGS) IMPLANT
BLADE CUDA GRT WHITE 3.5 (BLADE) ×3 IMPLANT
BLADE CUDA SHAVER 3.5 (BLADE) ×3 IMPLANT
BLADE GREAT WHITE 4.2 (BLADE) ×2 IMPLANT
BLADE GREAT WHITE 4.2MM (BLADE) ×1
BLADE SURG SZ11 CARB STEEL (BLADE) ×3 IMPLANT
BOOTIES KNEE HIGH SLOAN (MISCELLANEOUS) ×6 IMPLANT
BUR OVAL 6.0 (BURR) ×3 IMPLANT
CANNULA 5.75X7 CRYSTAL CLEAR (CANNULA) ×3 IMPLANT
CANNULA TWIST IN 8.25X7CM (CANNULA) ×12 IMPLANT
DRAPE ORTHO 2.5IN SPLIT 77X108 (DRAPES) ×1 IMPLANT
DRAPE ORTHO SPLIT 77X108 STRL (DRAPES) ×2
DRAPE POUCH INSTRU U-SHP 10X18 (DRAPES) ×3 IMPLANT
DRAPE SHEET LG 3/4 BI-LAMINATE (DRAPES) ×3 IMPLANT
DRAPE STERI 35X30 U-POUCH (DRAPES) ×3 IMPLANT
DRAPE SURG 17X11 SM STRL (DRAPES) ×3 IMPLANT
DRAPE U-SHAPE 47X51 STRL (DRAPES) ×3 IMPLANT
DRSG PAD ABDOMINAL 8X10 ST (GAUZE/BANDAGES/DRESSINGS) IMPLANT
ELECT REM PT RETURN 9FT ADLT (ELECTROSURGICAL) ×3
ELECTRODE REM PT RTRN 9FT ADLT (ELECTROSURGICAL) ×1 IMPLANT
FIBERSTICK 2 (SUTURE) IMPLANT
GAUZE SPONGE 4X4 12PLY STRL (GAUZE/BANDAGES/DRESSINGS) ×3 IMPLANT
GAUZE XEROFORM 1X8 LF (GAUZE/BANDAGES/DRESSINGS) ×3 IMPLANT
GLOVE BIOGEL PI IND STRL 8 (GLOVE) ×1 IMPLANT
GLOVE BIOGEL PI INDICATOR 8 (GLOVE) ×2
GLOVE ORTHO TXT STRL SZ7.5 (GLOVE) ×15 IMPLANT
GLOVE SURG SS PI 7.5 STRL IVOR (GLOVE) ×3 IMPLANT
GOWN STRL REUS W/TWL XL LVL3 (GOWN DISPOSABLE) ×9 IMPLANT
KIT BASIN OR (CUSTOM PROCEDURE TRAY) ×3 IMPLANT
KIT SHOULDER TRACTION (DRAPES) ×3 IMPLANT
MANIFOLD NEPTUNE II (INSTRUMENTS) ×3 IMPLANT
NEEDLE HYPO 20X1 EYE RM 11 (NEEDLE) IMPLANT
NEEDLE HYPO 22GX1.5 SAFETY (NEEDLE) ×3 IMPLANT
NEEDLE SCORPION MULTI FIRE (NEEDLE) ×3 IMPLANT
NEEDLE SPNL 18GX3.5 QUINCKE PK (NEEDLE) ×3 IMPLANT
NS IRRIG 1000ML POUR BTL (IV SOLUTION) ×3 IMPLANT
PACK SHOULDER (CUSTOM PROCEDURE TRAY) ×3 IMPLANT
PAD ABD 8X10 STRL (GAUZE/BANDAGES/DRESSINGS) ×3 IMPLANT
POSITIONER SURGICAL ARM (MISCELLANEOUS) ×3 IMPLANT
SET ARTHROSCOPY TUBING (MISCELLANEOUS) ×2
SET ARTHROSCOPY TUBING PVC (MISCELLANEOUS) ×1 IMPLANT
SLING ARM IMMOBILIZER LRG (SOFTGOODS) IMPLANT
SLING ARM IMMOBILIZER MED (SOFTGOODS) IMPLANT
SLING ULTRA II L (ORTHOPEDIC SUPPLIES) ×3 IMPLANT
SUT ETHILON 3 0 PS 1 (SUTURE) ×3 IMPLANT
SUT ETHILON 4 0 PS 2 18 (SUTURE) ×3 IMPLANT
SUT FIBERWIRE #2 38 T-5 BLUE (SUTURE)
SUT FIBERWIRE 2-0 18 17.9 3/8 (SUTURE) ×6
SUT PDS AB 0 CT 36 (SUTURE) IMPLANT
SUTURE FIBERWR #2 38 T-5 BLUE (SUTURE) IMPLANT
SUTURE FIBERWR 2-0 18 17.9 3/8 (SUTURE) ×2 IMPLANT
SYR 20CC LL (SYRINGE) ×3 IMPLANT
TAPE CLOTH SURG 6X10 WHT LF (GAUZE/BANDAGES/DRESSINGS) ×3 IMPLANT
TAPE FIBER 2MM 7IN #2 BLUE (SUTURE) ×3 IMPLANT
TOWEL OR 17X26 10 PK STRL BLUE (TOWEL DISPOSABLE) ×3 IMPLANT
TUBING CONNECTING 10 (TUBING) ×2 IMPLANT
TUBING CONNECTING 10' (TUBING) ×1
WAND 90 DEG TURBOVAC W/CORD (SURGICAL WAND) ×3 IMPLANT
WATER STERILE IRR 1500ML POUR (IV SOLUTION) IMPLANT

## 2014-12-28 NOTE — Op Note (Signed)
Preoperative diagnosis right shoulder labral tears, biceps tear, rotator cuff tears, before meals arthritis. Postoperative diagnosis #1 right shoulder extensive type I labral tearing #2 rupture long head of biceps tendon #3 rotator cuff tear large, massive supra and infraspinatus, before meals arthritis postop Procedure #1 right shoulder arthroscopic labral debridement #2 scopic cervical decompression acromioplasty bursectomy CA ligament release #3 arthroscopic distal clavicle resection were 4 arthroscopic rotator cuff mobilization and repair Surgeon Valma CavaAndrew Tanique Matney M.D. Asst. Marciano SequinBryson Stillwell PA-C Anesthesia interscalene block general Estimated blood loss minimal Drains none Complications none Disposition PACU stable  Operative details Patient was counseled in the holding area cracks I was marked. Interscalene block was administered per anesthesiologist. On the way to the operating room he received 3 g of Ancef intravenously prophylactic. He is taken operating room placed in a supine position. Under general endotracheal anesthesia. PAS stockings were applied for DVT prophylaxis. His internal into a left lateral decubitus position probably padded and bumped. Right shoulder examined and revealed full range of motion. Were very cautious with him due to his large body habitus. He was then prepped with DuraPrep and draped into a sterile fashion. Right Wallace Blassao was confirmed with timeout. Overhead shoulder positioner was utilized at 30 of abduction 10 of 4 flexion and 20 pounds of longitudinal traction to the large size of his arm. Posterior portal was crated arthroscope was placed into the glenohumeral joint. Diagnostic arthroscopy revealed intact articular cartilage glenohumeral ligaments instability here or a ruptured along with the biceps and there was no intra-articular stump. There was marked type I labral tearing utilizes shaver and cautery the labral tissue debrided back to healthy edges. Rotator cuff  was endoleak to have found to have a tear of the supraspinatous extended into the infraspinatus with rotator cuff tendinopathy. He was immobilized with intra-articular and extra-articular. The greater tuberosity for repair site was then prepared. Review soft tissue. The periosteum was removed from the acromion and an anterior inferior acromioplasty was performed convert into a flat acromion. Also the bursa was resected. Neurovascular structures were protected throughout the entire case including axillary nerve. Anterior portal with a flat made an outside to in technique burs and placed lateral 5-8 hours of the clavicle was removed circumferentially in the superior capsule intact the clavicle palpated and found to be stable. The infusion #2 Arthrex FiberWire margin convergence stitches were placed beginning medial going to lateral posterior apex of the tear. A small puncture wound was then made superiorly and Arthrex bio composite anchor was placed appropriate angle 2 fiber wire sutures were placed as 2 and addition to 2 fiber tapes a FiberWire sutures were then tied down. Arm was abducted all 4 sutures were placed another swivel lock anchor tapped and screwed into position for double row suture bridge technique is timeout that we had adequate coverage of the humeral head with the tissue hour admit the tissue was only fair quality but the sutures could be stable in the head was covered. Whether had masses were noted hemostasis been obtained arthroscopic equipment was removed and he was taken out of. Normal circulation. Portals were closed with 4 nylon suture another 10 mL of 0.1256% Sensorcaine and epinephrine was in place in the portals and subacromial region for hemostasis. Stertorous applied posterior sling turned supine awakened and taken operative room to the PACU in stable condition. He restabilize the PACU discharge to home. Due to the patient's large body habitus a complex to the surgery Mr. Marciano SequinBryson Stillwell  PA-C assistance was needed throughout the  entire case. Present for position prepping draping technical and surgical assistance throughout the entire case wound closure application dressing sling.

## 2014-12-28 NOTE — Progress Notes (Signed)
At about 2000, when trying to reposition patient, pt woke up and became agitated. Patient was moving his left around and trying to pull on his right arm. Pt unable to follow any command and was not responding to questions. He was grimacing some, so at 2013 he was given 1mg  of hydromophone for pain. Pt was still restless,  Throwing his arms around, it took nurses to hold him down. Dr Eugenia Mcalpineollins Robert was paged, Kandy GarrisonBrad Dickson PA responded to the page and he gave a verbal order for 5mg  of Haldol IV. Haldol given with and patient became calm. Pt still on the BIPAP,  Oxygen saturation in the low 90s.

## 2014-12-28 NOTE — Anesthesia Postprocedure Evaluation (Signed)
  Anesthesia Post-op Note  Patient: [name]  Procedure(s) Performed: Procedure(s) (LRB): RIGHT SHOULDER ARTHROSCOPY WITH SUBACROMIAL DECOMPRESSION, DISTAL CLAVICLE RESECTION, ROTATOR CUFF REPAIR  (Right)  Patient Location: PACU  Anesthesia Type: GA combined with regional for post-op pain  Level of Consciousness: awake  Airway and Oxygen Therapy: Patient Spontanous Breathing on auto bipap rate of 6  Post-op Pain: mild  Post-op Assessment: Post-op Vital signs reviewed, Patient's Cardiovascular Status Stable, On bipap, Patent Airway and No signs of Nausea or vomiting  Last Vitals:  Filed Vitals:   12/28/14 1740  BP: 145/75  Pulse:   Temp:   Resp: 21    Post-op Vital Signs: stable   Complications: No apparent anesthesia complications

## 2014-12-28 NOTE — H&P (View-Only) (Signed)
AssistedDr. Ewell with right, interscalene  block. Side rails up, monitors on throughout procedure. See vital signs in flow sheet. Tolerated Procedure well.  

## 2014-12-28 NOTE — Transfer of Care (Signed)
Immediate Anesthesia Transfer of Care Note  Patient: [name]  Procedure(s) Performed: Procedure(s): RIGHT SHOULDER ARTHROSCOPY WITH SUBACROMIAL DECOMPRESSION, DISTAL CLAVICLE RESECTION, ROTATOR CUFF REPAIR  (Right)  Patient Location: PACU  Anesthesia Type:General  Level of Consciousness:  sedated, patient cooperative and responds to stimulation  Airway & Oxygen Therapy:Patient Spontanous Breathing and Patient connected to face mask oxgen  Post-op Assessment:  Report given to PACU RN and Post -op Vital signs reviewed and stable  Post vital signs:  Reviewed and stable  Last Vitals:  Filed Vitals:   12/28/14 1258  BP:   Pulse: 75  Temp:   Resp: 18    Complications: No apparent anesthesia complications

## 2014-12-28 NOTE — H&P (Signed)
[name] is an 64 y.o. male.   Chief Complaint: Rigth shoulder pain for weeks  [id number]:Patient presents with joint discomfort that had been persistent for several months now. It started following a fall on to his right shoulder, he denies issues with the shoulder prior to the fall. Despite conservative treatments, his discomfort has not improved. Imaging was obtained. Other conservative and surgical treatments were discussed in detail. Patient wishes to proceed with surgery as consented. Denies SOB, CP, or calf pain. No Fever, chills, or nausea/vomiting.   Past Medical History  Diagnosis Date  . Arthritis   . RCT (rotator cuff tear)   . Multiple abrasions     rt arm    Past Surgical History  Procedure Laterality Date  . No past surgeries      Family History  Problem Relation Age of Onset  . Heart disease Mother   . Heart attack Mother   . Heart disease Brother    Social History:  reports that he has been smoking.  He does not have any smokeless tobacco history on file. He reports that he does not drink alcohol or use illicit drugs.  Allergies: No Known Allergies  Medications Prior to Admission  Medication Sig Dispense Refill  . HYDROcodone-acetaminophen (NORCO/VICODIN) 5-325 MG per tablet Take 1 tablet by mouth at bedtime.  0  . ibuprofen (ADVIL,MOTRIN) 200 MG tablet Take 200 mg by mouth every 6 (six) hours as needed.      No results found for this or any previous visit (from the past 48 hour(s)). No results found.  Review of Systems  Constitutional: Negative.   HENT: Negative.   Eyes: Negative.   Respiratory: Negative.   Cardiovascular: Negative.   Gastrointestinal: Negative.   Genitourinary: Negative.   Musculoskeletal: Positive for joint pain.  Skin: Negative.   Neurological: Negative.   Endo/Heme/Allergies: Negative.   Psychiatric/Behavioral: Negative.     Blood pressure 186/90, pulse 75, temperature 99.2 F (37.3 C), temperature source Oral, resp. rate 18,  height 5\' 11"  (1.803 m), weight 168.284 kg (371 lb), SpO2 100 %. Physical Exam  Constitutional: He is oriented to person, place, and time. He appears well-developed.  HENT:  Head: Normocephalic.  Eyes: EOM are normal.  Cardiovascular: Normal rate, normal heart sounds and intact distal pulses.   Respiratory: Effort normal.  GI: Soft.  Genitourinary:  Deferred  Musculoskeletal:  Right shoulder pain with ROM. RUE grossly the N/V intact.  Neurological: He is alert and oriented to person, place, and time.  Skin: Skin is warm and dry.  Psychiatric: His behavior is normal.     Assessment/Plan Right shoulder ACOA, RCT,bicep tendon tear: Right shoulder scope as consented today D/C home F/u in office next week Follow instructions Take medications as directed  [name] 12/28/2014, 1:04 PM

## 2014-12-28 NOTE — Anesthesia Procedure Notes (Addendum)
Anesthesia Regional Block:  Interscalene brachial plexus block  Pre-Anesthetic Checklist: ,, timeout performed, Correct Patient, Correct Site, Correct Laterality, Correct Procedure, Correct Position, site marked, Risks and benefits discussed,  Surgical consent,  Pre-op evaluation,  At surgeon's request and post-op pain management  Laterality: Right  Prep: chloraprep       Needles:  Injection technique: Single-shot  Needle Type: Echogenic Stimulator Needle     Needle Length: 15cm 15 cm Needle Gauge: 20 and 20 G    Additional Needles:  Procedures: ultrasound guided (picture in chart) Interscalene brachial plexus block  Nerve Stimulator or Paresthesia:  Response: 0.5 mA,   Additional Responses:   Narrative:  Start time: 12/28/2014 12:45 PM End time: 12/28/2014 12:50 PM Injection made incrementally with aspirations every 5 mL.  Performed by: Personally  Anesthesiologist: Ronelle NighEWELL, CHARLES  Additional Notes: Patient tolerated the procedure well without complications   Procedure Name: Intubation Date/Time: 12/28/2014 1:51 PM Performed by: Early OsmondEARGLE, Francella Barnett E Pre-anesthesia Checklist: Patient identified, Emergency Drugs available, Suction available and Patient being monitored Patient Re-evaluated:Patient Re-evaluated prior to inductionOxygen Delivery Method: Circle System Utilized Preoxygenation: Pre-oxygenation with 100% oxygen Intubation Type: IV induction Ventilation: Mask ventilation without difficulty Laryngoscope Size: Glidescope and 4 Grade View: Grade I Tube type: Oral Tube size: 8.0 mm Number of attempts: 1 Airway Equipment and Method: Stylet,  Oral airway and Video-laryngoscopy Placement Confirmation: ETT inserted through vocal cords under direct vision,  positive ETCO2 and breath sounds checked- equal and bilateral Secured at: 21 cm Tube secured with: Tape Dental Injury: Teeth and Oropharynx as per pre-operative assessment  Comments: Atraumatic intubation with  Glidescope by P. Williford CRNA

## 2014-12-28 NOTE — Anesthesia Preprocedure Evaluation (Addendum)
Anesthesia Evaluation  Patient identified by MRN, date of birth, ID band Patient awake    Reviewed: Allergy & Precautions, H&P , NPO status , Patient's Chart, lab work & pertinent test results  Airway Mallampati: III  TM Distance: >3 FB Neck ROM: full    Dental  (+) Edentulous Upper, Edentulous Lower, Dental Advisory Given   Pulmonary shortness of breath and with exertion, COPDCurrent Smoker,  breath sounds clear to auscultation  Pulmonary exam normal       Cardiovascular Exercise Tolerance: Good negative cardio ROS  Rhythm:regular Rate:Normal  RBBB   Neuro/Psych negative neurological ROS  negative psych ROS   GI/Hepatic negative GI ROS, Neg liver ROS,   Endo/Other  Morbid obesityHyperglycemia with metabolic syndrome  Renal/GU negative Renal ROS  negative genitourinary   Musculoskeletal   Abdominal (+) + obese,   Peds  Hematology negative hematology ROS (+)   Anesthesia Other Findings   Reproductive/Obstetrics negative OB ROS                           Anesthesia Physical Anesthesia Plan  ASA: IV  Anesthesia Plan: General   Post-op Pain Management:    Induction: Intravenous  Airway Management Planned: Oral ETT  Additional Equipment:   Intra-op Plan:   Post-operative Plan: Extubation in OR  Informed Consent: I have reviewed the patients History and Physical, chart, labs and discussed the procedure including the risks, benefits and alternatives for the proposed anesthesia with the patient or authorized representative who has indicated his/her understanding and acceptance.   Dental Advisory Given  Plan Discussed with: CRNA and Surgeon  Anesthesia Plan Comments:         Anesthesia Quick Evaluation

## 2014-12-28 NOTE — Consult Note (Addendum)
[facility] Medical Consultation  [name] JXB:[id number] DOB: 1951-05-17 DOA: 12/28/2014 PCP: No primary care provider on file.   Requesting physician: Dr. [facility] Date of consultation:  12/28/2014 Reason for consultation: Decreased O2 Saturations and Combativeness Post Surgery     Impression/Recommendations Principal Problem:   1.   Acute encephalopathy- Multifactorial Due To Hypoxemia and Hypercarbia and Pain and Post Anesthesia   BIPAP, Monitor O2 Sats   Haldol 2.5 mg IV q 6 hrs PRN Severe Agitation     Active Problems:   2.   Acute respiratory failure- with Hypercapnea >>> ABG Results pH = 7.194/ pCO@ = 77.2/ pO2 = 76.3/   HCO3= 28.8/  O2 Sats = 93.3%   Placed on BiPAP>>> Most Likely Undiagnosed OSA    Adjust BiPAP PRN    Repeat ABG in 1 hour     May Need Intubation if no improvement    PCCM consulted, Spoke with Dr. Darrick Pennaeterding    NPO while on BiPAP    Check Troponins, Continue Cardiac monitoring           3.   S/P rotator cuff repair/S/P shoulder surgery   Pain control with IV Dilaudid PRN   Place  Fentanyl Patch 75 mcg/ hr      4.   Hypertensive urgency- due to Pain, and Probable History of Uncontrolled HTN   IV Hydralazine PRN SBP > 160   Pain control      5.   Hyperglycemia   Check HbA1C    SSI coverage PRN     6.   Testicular mass-    Scrotal US ordered to further define   Will Need Referral to Urology For Further Evaluation     7.   BMI 50.0-59.9, adult   Needs Weight Loss      8.   Tobacco abuse- Unable to OBtain his Tobacco History at this time   Nicotine Patch  7 mg      9.    DVT Prophylaxis    SCDs        The Rounding Team from [facility] will followup again tomorrow. Please contact me if I can be of assistance in the meantime. Thank you for this consultation.      Chief Complaint: Decreased O2 Saturations and Combativeness Post Surgery   HPI: [name] is a 64 y.o. male  with no reported history of Medical problems other that Right Shoulder Arthropathy who was taken to surgery in the Short Stay Unit for an elective Rotator Cuff Repair, and post operatively he began to have combativeness and hypoxemia and was placed on BiPAP and Administered IV Haldol X 1, and 1 mg IV Dilaudid and began to rest.    He was sedated during the evaluation. His admistered labs reveal mild hyperglycemia at 179.   His blood pressures were elevated during his combativeness to 211/113, and now 113/ 51 after a total of  2 mg of IV Dilaudid.    He was found to have Respiratory acidosis ( pH =7.194) and with hypercapnea pCO2 = 77.2.    PCCM       Review of Systems: Unable to Obtain from the Patient   Past Medical History  Diagnosis Date  . Arthritis   . RCT (rotator cuff tear)   . Multiple abrasions     rt arm  . Morbid obesity      Past Surgical History  Procedure Laterality Date  .  No past surgeries        Prior to Admission medications   Medication Sig Start Date End Date Taking? Authorizing Provider  HYDROcodone-acetaminophen (NORCO/VICODIN) 5-325 MG per tablet Take 1 tablet by mouth at bedtime. 11/14/14  Yes Historical Provider, MD  ibuprofen (ADVIL,MOTRIN) 200 MG tablet Take 200 mg by mouth every 6 (six) hours as needed.   Yes Historical Provider, MD  baclofen (LIORESAL) 10 MG tablet Take 1 tablet (10 mg total) by mouth 3 (three) times daily as needed for muscle spasms. 12/28/14   Bryson L Stilwell, PA-C  cephALEXin (KEFLEX) 500 MG capsule Take 1 capsule (500 mg total) by mouth 4 (four) times daily. 12/28/14   Bryson L Stilwell, PA-C  oxyCODONE-acetaminophen (ROXICET) 5-325 MG per tablet Take 1-2 tablets by mouth every 4 (four) hours as needed for severe pain. 12/28/14   Bryson L Stilwell, PA-C     No Known Allergies  Social History:  reports that he has been smoking.  He does not have any smokeless tobacco history on file. He reports that he does not drink alcohol or use  illicit drugs.    Family History  Problem Relation Age of Onset  . Heart disease Mother   . Heart attack Mother   . Heart disease Brother        Physical Exam:  GEN:  Obtunded Morbidly Obese  64 y.o. Caucasian male examined now on BiPAP and in no acute distress;  Filed Vitals:   12/28/14 1855 12/28/14 1856 12/28/14 1932 12/28/14 1955  BP:  198/95 150/76   Pulse:      Temp: 98.5 F (36.9 C)   98.1 F (36.7 C)  TempSrc: Axillary   Axillary  Resp:   14   Height:   (1.803 m)    Weight:  172.82 kg (381 lb)    SpO2:   94%    Blood pressure 150/76, pulse 85, temperature 98.1 F (36.7 C), temperature source Axillary, resp. rate 14, height  (1.803 m), weight 172.82 kg (381 lb), SpO2 94 %. PSYCH: He is alert and oriented x 0  and Obtunded  HEENT: Normocephalic and Atraumatic, Mucous membranes pink; PERRLA; EOM intact; Fundi:  Benign;  No scleral icterus, Nares: Patent, Oropharynx: Clear, Edentulous   Neck:  FROM, No Cervical Lymphadenopathy nor Thyromegaly or Carotid Bruit; No JVD; Breasts:: Not examined CHEST WALL: No tenderness CHEST: Normal respiration, clear to auscultation bilaterally HEART: Regular rate and rhythm; no murmurs rubs or gallops BACK: No kyphosis or scoliosis; No CVA tenderness ABDOMEN: Positive Bowel Sounds, Obese, Soft Non-Tender, No Rebound or Guarding; No Masses, No Organomegaly, +Pannus with Intertriginous Candida of Groin . Rectal Exam: Not done EXTREMITIES: No Cyanosis, Clubbing, or Edema; No Ulcerations. Genitalia: Large Firm Right Testicular Mass,  Scrotal nodules present,  Left Testicle Present , Penis Uncircumsized  PULSES: 2+ and symmetric SKIN: Patches of Hyperpigmentation,  NEURO:  Alert and Oriented x 0, Obtunded; Able to Move all 4 Extremities Vascular: pulses palpable throughout    Labs on Admission:  Basic Metabolic Panel:  Recent Labs Lab 12/25/14 1457  NA 141  K 4.1  CL 103  CO2 32  GLUCOSE 179*  BUN 11  CREATININE  0.87  CALCIUM 8.8   Liver Function Tests: No results for input(s): AST, ALT, ALKPHOS, BILITOT, PROT, ALBUMIN in the last 168 hours. No results for input(s): LIPASE, AMYLASE in the last 168 hours. No results for input(s): AMMONIA in the last 168 hours. CBC:  Recent Labs Lab  12/25/14 1457  WBC 9.8  HGB 17.2*  HCT 54.2*  MCV 99.6  PLT 179   Cardiac Enzymes: No results for input(s): CKTOTAL, CKMB, CKMBINDEX, TROPONINI in the last 168 hours.  BNP (last 3 results) No results for input(s): BNP in the last 8760 hours.  ProBNP (last 3 results) No results for input(s): PROBNP in the last 8760 hours.  CBG: No results for input(s): GLUCAP in the last 168 hours.  Radiological Exams on Admission: No results found.   EKG: Independently reviewed.   Normal Sinus Rhythm Rate 86,  +RBBB,   T-Wave Depression in Antero-Septal Leads    Code Status:     FULL CODE     Family Communication:   No Family Present    Disposition Plan:    Inpatient  Status        Time spent:  120 Minutes      Ron Parker [facility] Pager [phone]   If 7AM -7PM Please Contact the Day Rounding Team MD for [facility]  If 7PM-7AM, Please Contact Night-Floor Coverage  [email or web address] Password [facility] 12/28/2014, 9:21 PM     ADDENDUM:   Patient was seen and examined on 12/28/2014

## 2014-12-28 NOTE — Interval H&P Note (Signed)
History and Physical Interval Note:  12/28/2014 1:22 PM  [name]  has presented today for surgery, with the diagnosis of right shoulder osetoarthritis  rotator cuff tear  biceps tendon tear  The various methods of treatment have been discussed with the patient and family. After consideration of risks, benefits and other options for treatment, the patient has consented to  Procedure(s): RIGHT SHOULDER ARTHROSCOPY WITH SUBACROMIAL DECOMPRESSION, DISTAL CLAVICLE RESECTION, ROTATOR CUFF REPAIR AND BICEP TENODOMY (Right) as a surgical intervention .  The patient's history has been reviewed, patient examined, no change in status, stable for surgery.  I have reviewed the patient's chart and labs.  Questions were answered to the patient's satisfaction.     [name] ANDREW

## 2014-12-28 NOTE — Progress Notes (Signed)
AssistedDr. Ewell with right, interscalene  block. Side rails up, monitors on throughout procedure. See vital signs in flow sheet. Tolerated Procedure well.  

## 2014-12-28 NOTE — Progress Notes (Signed)
Extremely difficult to extubate as anticipated.  Starting CPAP in PACU.  Has nasal trumpet.  Is awake and responsive.  Will admit to step down.  SaO2 86 - 90% on 100% FiO2.   Talley Casco MD

## 2014-12-29 ENCOUNTER — Inpatient Hospital Stay (HOSPITAL_COMMUNITY): Payer: Worker's Compensation

## 2014-12-29 DIAGNOSIS — J9601 Acute respiratory failure with hypoxia: Secondary | ICD-10-CM

## 2014-12-29 DIAGNOSIS — N508 Other specified disorders of male genital organs: Secondary | ICD-10-CM

## 2014-12-29 DIAGNOSIS — J9602 Acute respiratory failure with hypercapnia: Secondary | ICD-10-CM

## 2014-12-29 DIAGNOSIS — J189 Pneumonia, unspecified organism: Secondary | ICD-10-CM

## 2014-12-29 DIAGNOSIS — G9340 Encephalopathy, unspecified: Secondary | ICD-10-CM

## 2014-12-29 LAB — GLUCOSE, CAPILLARY
GLUCOSE-CAPILLARY: 240 mg/dL — AB (ref 70–99)
GLUCOSE-CAPILLARY: 194 mg/dL — AB (ref 70–99)
Glucose-Capillary: 194 mg/dL — ABNORMAL HIGH (ref 70–99)
Glucose-Capillary: 200 mg/dL — ABNORMAL HIGH (ref 70–99)
Glucose-Capillary: 186 mg/dL — ABNORMAL HIGH (ref 70–99)
Glucose-Capillary: 127 mg/dL — ABNORMAL HIGH (ref 70–99)

## 2014-12-29 LAB — BLOOD GAS, ARTERIAL
Acid-Base Excess: 1.7 mmol/L (ref 0.0–2.0)
Acid-base deficit: 0.6 mmol/L (ref 0.0–2.0)
BICARBONATE: 33.1 meq/L — AB (ref 20.0–24.0)
Bicarbonate: 30.4 mEq/L — ABNORMAL HIGH (ref 20.0–24.0)
Delivery systems: POSITIVE
Drawn by: 235321
Expiratory PAP: 8
FIO2: 1 %
FIO2: 1 %
INSPIRATORY PAP: 20
Mode: POSITIVE
O2 SAT: 97 %
O2 Saturation: 97.7 %
PATIENT TEMPERATURE: 98.9
PEEP/CPAP: 8 cmH2O
PH ART: 7.279 — AB (ref 7.350–7.450)
PO2 ART: 111 mmHg — AB (ref 80.0–100.0)
Patient temperature: 98.1
RATE: 18 resp/min
TCO2: 30 mmol/L (ref 0–100)
TCO2: 26.8 mmol/L (ref 0–100)
VT: 600 mL
pCO2 arterial: 97 mmHg (ref 35.0–45.0)
pCO2 arterial: 67.1 mmHg (ref 35.0–45.0)
pH, Arterial: 7.158 — CL (ref 7.350–7.450)
pO2, Arterial: 110 mmHg — ABNORMAL HIGH (ref 80.0–100.0)

## 2014-12-29 LAB — MRSA PCR SCREENING: MRSA by PCR: NEGATIVE

## 2014-12-29 LAB — TROPONIN I
TROPONIN I: 0.17 ng/mL — AB (ref <0.031)
Troponin I: 0.18 ng/mL — ABNORMAL HIGH (ref <0.031)

## 2014-12-29 LAB — PROCALCITONIN: Procalcitonin: 0.69 ng/mL

## 2014-12-29 LAB — TRIGLYCERIDES: TRIGLYCERIDES: 183 mg/dL — AB (ref <150)

## 2014-12-29 MED ORDER — CETYLPYRIDINIUM CHLORIDE 0.05 % MT LIQD
7.0000 mL | Freq: Four times a day (QID) | OROMUCOSAL | Status: DC
  Administered 2014-12-29 – 2015-01-13 (×63): 7 mL via OROMUCOSAL

## 2014-12-29 MED ORDER — FREE WATER
200.0000 mL | Freq: Three times a day (TID) | Status: DC
  Administered 2014-12-29 – 2014-12-30 (×5): 200 mL

## 2014-12-29 MED ORDER — PROPOFOL 1000 MG/100ML IV EMUL
0.0000 ug/kg/min | INTRAVENOUS | Status: DC
  Administered 2014-12-29: 5 ug/kg/min via INTRAVENOUS
  Administered 2014-12-29: 25 ug/kg/min via INTRAVENOUS
  Administered 2014-12-29: 20 ug/kg/min via INTRAVENOUS
  Administered 2014-12-29 (×3): 30 ug/kg/min via INTRAVENOUS
  Administered 2014-12-30 (×4): 35 ug/kg/min via INTRAVENOUS
  Administered 2014-12-30: 20 ug/kg/min via INTRAVENOUS
  Administered 2014-12-30: 35 ug/kg/min via INTRAVENOUS
  Administered 2014-12-30: 40 ug/kg/min via INTRAVENOUS
  Administered 2014-12-31: 25 ug/kg/min via INTRAVENOUS
  Administered 2014-12-31: 35 ug/kg/min via INTRAVENOUS
  Administered 2014-12-31: 25 ug/kg/min via INTRAVENOUS
  Administered 2014-12-31: 22 ug/kg/min via INTRAVENOUS
  Administered 2014-12-31: 18 ug/kg/min via INTRAVENOUS
  Administered 2014-12-31: 35 ug/kg/min via INTRAVENOUS
  Administered 2015-01-01 – 2015-01-02 (×10): 25 ug/kg/min via INTRAVENOUS
  Administered 2015-01-02: 15 ug/kg/min via INTRAVENOUS
  Administered 2015-01-02: 25 ug/kg/min via INTRAVENOUS
  Administered 2015-01-03: 15 ug/kg/min via INTRAVENOUS
  Administered 2015-01-03 (×2): 25.077 ug/kg/min via INTRAVENOUS
  Filled 2014-12-29 (×38): qty 100

## 2014-12-29 MED ORDER — HEPARIN SODIUM (PORCINE) 5000 UNIT/ML IJ SOLN
5000.0000 [IU] | Freq: Three times a day (TID) | INTRAMUSCULAR | Status: DC
  Administered 2014-12-29 – 2015-01-09 (×34): 5000 [IU] via SUBCUTANEOUS
  Filled 2014-12-29 (×34): qty 1

## 2014-12-29 MED ORDER — VITAL HIGH PROTEIN PO LIQD
1000.0000 mL | ORAL | Status: DC
  Administered 2014-12-29: 1000 mL
  Filled 2014-12-29 (×2): qty 1000

## 2014-12-29 MED ORDER — SENNOSIDES 8.8 MG/5ML PO SYRP
5.0000 mL | ORAL_SOLUTION | Freq: Two times a day (BID) | ORAL | Status: DC | PRN
  Administered 2015-01-03 – 2015-01-09 (×5): 5 mL
  Filled 2014-12-29 (×7): qty 5

## 2014-12-29 MED ORDER — SODIUM CHLORIDE 0.9 % IV BOLUS (SEPSIS)
500.0000 mL | Freq: Once | INTRAVENOUS | Status: AC
  Administered 2014-12-29: 500 mL via INTRAVENOUS

## 2014-12-29 MED ORDER — SODIUM CHLORIDE 0.9 % IJ SOLN
10.0000 mL | INTRAMUSCULAR | Status: DC | PRN
  Administered 2014-12-31 – 2015-01-14 (×3): 10 mL
  Filled 2014-12-29 (×3): qty 40

## 2014-12-29 MED ORDER — SODIUM CHLORIDE 0.9 % IV SOLN
25.0000 ug/h | INTRAVENOUS | Status: DC
  Administered 2014-12-29: 50 ug/h via INTRAVENOUS
  Administered 2014-12-30 (×2): 100 ug/h via INTRAVENOUS
  Administered 2014-12-31: 75 ug/h via INTRAVENOUS
  Administered 2015-01-01 – 2015-01-08 (×4): 50 ug/h via INTRAVENOUS
  Administered 2015-01-10: 100 ug/h via INTRAVENOUS
  Filled 2014-12-29 (×9): qty 50

## 2014-12-29 MED ORDER — FAMOTIDINE IN NACL 20-0.9 MG/50ML-% IV SOLN
20.0000 mg | Freq: Two times a day (BID) | INTRAVENOUS | Status: DC
  Administered 2014-12-29 (×2): 20 mg via INTRAVENOUS
  Filled 2014-12-29 (×2): qty 50

## 2014-12-29 MED ORDER — IPRATROPIUM-ALBUTEROL 0.5-2.5 (3) MG/3ML IN SOLN
3.0000 mL | Freq: Four times a day (QID) | RESPIRATORY_TRACT | Status: DC
  Administered 2014-12-29 – 2015-01-10 (×47): 3 mL via RESPIRATORY_TRACT
  Filled 2014-12-29 (×48): qty 3

## 2014-12-29 MED ORDER — INSULIN ASPART 100 UNIT/ML ~~LOC~~ SOLN
0.0000 [IU] | SUBCUTANEOUS | Status: DC
  Administered 2014-12-29: 2 [IU] via SUBCUTANEOUS
  Administered 2014-12-29: 3 [IU] via SUBCUTANEOUS
  Administered 2014-12-30 (×3): 2 [IU] via SUBCUTANEOUS
  Administered 2014-12-30: 3 [IU] via SUBCUTANEOUS
  Administered 2014-12-30 – 2014-12-31 (×3): 2 [IU] via SUBCUTANEOUS
  Administered 2014-12-31: 3 [IU] via SUBCUTANEOUS
  Administered 2014-12-31: 2 [IU] via SUBCUTANEOUS
  Administered 2014-12-31 – 2015-01-02 (×14): 3 [IU] via SUBCUTANEOUS
  Administered 2015-01-02: 2 [IU] via SUBCUTANEOUS
  Administered 2015-01-03: 5 [IU] via SUBCUTANEOUS
  Administered 2015-01-03 (×4): 3 [IU] via SUBCUTANEOUS
  Administered 2015-01-03 – 2015-01-04 (×2): 5 [IU] via SUBCUTANEOUS
  Administered 2015-01-04 (×2): 3 [IU] via SUBCUTANEOUS
  Administered 2015-01-04: 5 [IU] via SUBCUTANEOUS
  Administered 2015-01-04 – 2015-01-05 (×2): 3 [IU] via SUBCUTANEOUS
  Administered 2015-01-05: 5 [IU] via SUBCUTANEOUS
  Administered 2015-01-05: 3 [IU] via SUBCUTANEOUS
  Administered 2015-01-05 (×2): 5 [IU] via SUBCUTANEOUS
  Administered 2015-01-05: 3 [IU] via SUBCUTANEOUS
  Administered 2015-01-06: 5 [IU] via SUBCUTANEOUS
  Administered 2015-01-06: 3 [IU] via SUBCUTANEOUS
  Administered 2015-01-06: 5 [IU] via SUBCUTANEOUS
  Administered 2015-01-06 (×3): 3 [IU] via SUBCUTANEOUS
  Administered 2015-01-06: 5 [IU] via SUBCUTANEOUS
  Administered 2015-01-07 – 2015-01-10 (×18): 3 [IU] via SUBCUTANEOUS
  Administered 2015-01-10 (×5): 2 [IU] via SUBCUTANEOUS
  Administered 2015-01-11: 3 [IU] via SUBCUTANEOUS
  Administered 2015-01-11: 5 [IU] via SUBCUTANEOUS
  Administered 2015-01-11 (×2): 3 [IU] via SUBCUTANEOUS
  Administered 2015-01-11: 2 [IU] via SUBCUTANEOUS
  Administered 2015-01-12: 5 [IU] via SUBCUTANEOUS
  Administered 2015-01-12: 3 [IU] via SUBCUTANEOUS
  Administered 2015-01-12 (×3): 5 [IU] via SUBCUTANEOUS
  Administered 2015-01-12: 3 [IU] via SUBCUTANEOUS
  Administered 2015-01-13: 5 [IU] via SUBCUTANEOUS
  Administered 2015-01-13: 8 [IU] via SUBCUTANEOUS
  Administered 2015-01-13 (×3): 3 [IU] via SUBCUTANEOUS
  Administered 2015-01-13: 5 [IU] via SUBCUTANEOUS
  Administered 2015-01-14 (×2): 3 [IU] via SUBCUTANEOUS
  Administered 2015-01-14: 5 [IU] via SUBCUTANEOUS

## 2014-12-29 MED ORDER — FAMOTIDINE 20 MG PO TABS
20.0000 mg | ORAL_TABLET | Freq: Two times a day (BID) | ORAL | Status: DC
  Administered 2014-12-30 – 2015-01-10 (×24): 20 mg
  Filled 2014-12-29 (×26): qty 1

## 2014-12-29 MED ORDER — FENTANYL CITRATE (PF) 100 MCG/2ML IJ SOLN
50.0000 ug | Freq: Once | INTRAMUSCULAR | Status: AC
  Administered 2014-12-29: 50 ug via INTRAVENOUS

## 2014-12-29 MED ORDER — DEXAMETHASONE SODIUM PHOSPHATE 4 MG/ML IJ SOLN
4.0000 mg | Freq: Four times a day (QID) | INTRAMUSCULAR | Status: DC
  Administered 2014-12-29 (×2): 4 mg via INTRAVENOUS
  Filled 2014-12-29 (×2): qty 1

## 2014-12-29 MED ORDER — ASPIRIN 325 MG PO TABS
325.0000 mg | ORAL_TABLET | Freq: Every day | ORAL | Status: DC
  Administered 2014-12-29 – 2015-01-13 (×16): 325 mg via ORAL
  Filled 2014-12-29 (×17): qty 1

## 2014-12-29 MED ORDER — CHLORHEXIDINE GLUCONATE 0.12 % MT SOLN
15.0000 mL | Freq: Two times a day (BID) | OROMUCOSAL | Status: DC
Start: 2014-12-29 — End: 2015-01-13
  Administered 2014-12-29 – 2015-01-13 (×32): 15 mL via OROMUCOSAL
  Filled 2014-12-29 (×31): qty 15

## 2014-12-29 MED ORDER — HYDRALAZINE HCL 20 MG/ML IJ SOLN
10.0000 mg | INTRAMUSCULAR | Status: DC | PRN
  Administered 2015-01-01 – 2015-01-03 (×3): 20 mg via INTRAVENOUS
  Administered 2015-01-04: 10 mg via INTRAVENOUS
  Filled 2014-12-29 (×4): qty 1

## 2014-12-29 MED ORDER — LEVOFLOXACIN IN D5W 750 MG/150ML IV SOLN
750.0000 mg | INTRAVENOUS | Status: DC
  Administered 2014-12-29 – 2015-01-01 (×4): 750 mg via INTRAVENOUS
  Filled 2014-12-29 (×4): qty 150

## 2014-12-29 MED ORDER — SODIUM CHLORIDE 0.9 % IJ SOLN
10.0000 mL | Freq: Two times a day (BID) | INTRAMUSCULAR | Status: DC
  Administered 2014-12-29 – 2014-12-31 (×4): 10 mL
  Administered 2014-12-31: 20 mL
  Administered 2015-01-01 – 2015-01-03 (×5): 10 mL
  Administered 2015-01-04: 20 mL
  Administered 2015-01-04 – 2015-01-17 (×20): 10 mL
  Administered 2015-01-18: 30 mL
  Administered 2015-01-18: 20 mL
  Administered 2015-01-19: 10 mL

## 2014-12-29 MED ORDER — FENTANYL BOLUS VIA INFUSION
50.0000 ug | INTRAVENOUS | Status: DC | PRN
  Administered 2014-12-31 – 2015-01-09 (×5): 50 ug via INTRAVENOUS
  Filled 2014-12-29: qty 50

## 2014-12-29 NOTE — Progress Notes (Signed)
eLink Physician-Brief Progress Note Patient Name: [name] DOB: May 18, 1951 MRN: [id number]   Date of Service  12/29/2014  HPI/Events of Note  Blood glucose = 186. Novolog insulin coverage appears to have been D/Ced.  eICU Interventions  Will order moderate Novolog SSI Q 4 hours.     Intervention Category Minor Interventions: Routine modifications to care plan (e.g. PRN medications for pain, fever)  Mann Skaggs Eugene 12/29/2014, 4:41 PM

## 2014-12-29 NOTE — Progress Notes (Signed)
eLink Physician-Brief Progress Note Patient Name: [name] Tober DOB: 1951-03-15 MRN: [id number]   Date of Service  12/29/2014  HPI/Events of Note  Hypotension s/p intubation with Heidelberger BP of 67/34 (45).  Had received 500 cc NS bolus earlier.  On propofol and fentanyl for sedation  eICU Interventions  Plan: 500 cc NS bolus Insert Aline for HD monitoring     Intervention Category Intermediate Interventions: Hypotension - evaluation and management  Nasiya Pascual 12/29/2014, 12:48 AM

## 2014-12-29 NOTE — Consult Note (Signed)
PULMONARY / CRITICAL CARE MEDICINE   Name: Oral Mckercher MRN: [id number] DOB: [birth year]    ADMISSION DATE:  12/28/2014 CONSULTATION DATE:  12/28/14   REFERRING MD :  Lovell Sheehan  CHIEF COMPLAINT:  Hypercarbic respiratory failure  INITIAL PRESENTATION: s/p rotator cuff repair  STUDIES:  CXR  SIGNIFICANT EVENTS: reintubated on 4/22   HISTORY OF PRESENT ILLNESS:  64 y/o with h/o HTN who was admitted to step down after difficulty extubating after rotator cuff repair.  Has a 44 pack year smoking hx but no formal COPD diagnosis.  Pre-op BMP with Hgb of 17 suggesting some degree of hypoxia and  CO2 of 32 suggesting CO2 retention at baseline, likely 2/2 to obesity hypoventilation syndrome.   PAST MEDICAL HISTORY :   has a past medical history of Arthritis; RCT (rotator cuff tear); Multiple abrasions; and Morbid obesity.  has past surgical history that includes No past surgeries. Prior to Admission medications   Medication Sig Start Date End Date Taking? Authorizing Provider  HYDROcodone-acetaminophen (NORCO/VICODIN) 5-325 MG per tablet Take 1 tablet by mouth at bedtime. 11/14/14  Yes Historical Provider, MD  ibuprofen (ADVIL,MOTRIN) 200 MG tablet Take 200 mg by mouth every 6 (six) hours as needed.   Yes Historical Provider, MD  baclofen (LIORESAL) 10 MG tablet Take 1 tablet (10 mg total) by mouth 3 (three) times daily as needed for muscle spasms. 12/28/14   Bryson L Stilwell, PA-C  cephALEXin (KEFLEX) 500 MG capsule Take 1 capsule (500 mg total) by mouth 4 (four) times daily. 12/28/14   Bryson L Stilwell, PA-C  oxyCODONE-acetaminophen (ROXICET) 5-325 MG per tablet Take 1-2 tablets by mouth every 4 (four) hours as needed for severe pain. 12/28/14   Bryson L Stilwell, PA-C   No Known Allergies  FAMILY HISTORY:  indicated that his mother is deceased. He indicated that his father is deceased. He indicated that his sister is alive. He indicated that only one of his two brothers is alive.  SOCIAL  HISTORY:  reports that he has been smoking.  He does not have any smokeless tobacco history on file. He reports that he does not drink alcohol or use illicit drugs.  REVIEW OF SYSTEMS:  Could not be obtained as pt had decreased level of consciousness and was not able to answer questions.  SUBJECTIVE:   VITAL SIGNS: Temp:  [96.8 F (36 C)-99.2 F (37.3 C)] 98.9 F (37.2 C) (04/22 2330) Pulse Rate:  [68-110] 86 (04/22 2200) Resp:  [12-23] 15 (04/22 2200) BP: (106-211)/(45-113) 106/45 mmHg (04/22 2200) SpO2:  [81 %-100 %] 92 % (04/22 2200) FiO2 (%):  [100 %] 100 % (04/22 2350) Weight:  [168.284 kg (371 lb)-172.82 kg (381 lb)] 172.82 kg (381 lb) (04/22 1856) HEMODYNAMICS:   VENTILATOR SETTINGS: Vent Mode:  [-] PRVC FiO2 (%):  [100 %] 100 % Set Rate:  [18 bmp] 18 bmp Vt Set:  [540 mL-600 mL] 600 mL PEEP:  [8 cmH20] 8 cmH20 Plateau Pressure:  [21 cmH20] 21 cmH20 INTAKE / OUTPUT:  Intake/Output Summary (Last 24 hours) at 12/29/14 0008 Last data filed at 12/28/14 2149  Gross per 24 hour  Intake   1560 ml  Output      0 ml  Net   1560 ml    PHYSICAL EXAMINATION: General:  Obese male in bed, BiPap in place Neuro:  Does not awaken to voice  HEENT:  PERRL, EOMI, bipap mask in place, blood in oropharynx Cardiovascular:  NRRR, no MRG Lungs:  Diminished air movment throughout.  Abdomen:  Obese, +BS, No appreciable HSM Musculoskeletal:  No clubbing or edema, right arm in sling, dressing c/d/i Skin:  No bruising, rashes or other lesions.   LABS:  CBC  Recent Labs Lab 12/25/14 1457  WBC 9.8  HGB 17.2*  HCT 54.2*  PLT 179   Coag's No results for input(s): APTT, INR in the last 168 hours. BMET  Recent Labs Lab 12/25/14 1457  NA 141  K 4.1  CL 103  CO2 32  BUN 11  CREATININE 0.87  GLUCOSE 179*   Electrolytes  Recent Labs Lab 12/25/14 1457  CALCIUM 8.8   Sepsis Markers No results for input(s): LATICACIDVEN, PROCALCITON, O2SATVEN in the last 168  hours. ABG  Recent Labs Lab 12/28/14 2105  PHART 7.194*  PCO2ART 77.2*  PO2ART 76.3*   Liver Enzymes No results for input(s): AST, ALT, ALKPHOS, BILITOT, ALBUMIN in the last 168 hours. Cardiac Enzymes  Recent Labs Lab 12/28/14 2150  TROPONINI 0.05*   Glucose  Recent Labs Lab 12/28/14 2140  GLUCAP 171*    Imaging Dg Chest Port 1 View  12/28/2014   CLINICAL DATA:  Hypoxia.  Respiratory distress.  EXAM: PORTABLE CHEST - 1 VIEW  COMPARISON:  None.  FINDINGS: There is cardiomegaly. Pulmonary vascularity is normal. There appears to be atelectasis at the right lung base. No discrete effusions. No acute osseous abnormality.  IMPRESSION: Cardiomegaly.  Probable atelectasis at the right lung base.   Electronically Signed   By: Francene BoyersJames  Maxwell M.D.   On: 12/28/2014 21:58     ASSESSMENT / PLAN:  PULMONARY OETT 7.0 26 cm at the lip A: Hypercarbic respiratory failure likely multifactorial P:   -Pt had some improvement in air movement with racemic epi - will start decadron for post op laryngeal edema for 24h -Likely COPD - duonebs q4, decadron will also treat airway obstruction -OHS - will need to be extubated to BiPap. - Will need outpt sleep study.   CARDIOVASCULAR CVL none A: Hypotension P:  2/2 to sedation Fluid bolus, if continues may need low dose phenylephrine  RENAL A:  No Mcree problems P:   Monitor electrolytes and ins and outs  GASTROINTESTINAL A:  At risk for stress ulcer P:   Stress ulcer prophylaxis  HEMATOLOGIC A:  [place] P:  Likely 2/2 to OHS   INFECTIOUS A:  Bilateral infiltrates on CXR P:  On cefazolin perioperatively, monitor for signs and sx of infection  ENDOCRINE A:  Hyperglycemia   P:   May need SSI with addition of steroids  NEUROLOGIC A:  Sedation P:   RASS goal: 0 Propofol and fentanyl gtt   FAMILY  - Updates: Patient's brother updated by phone  - Inter-disciplinary family meet or Palliative Care meeting due by:   01/04/15    TODAY'S SUMMARY: 64 y/o with likely OHS and COPD who required reintubation for hypercarbic resp failure after rotator cuff repair.      Sandi Carnelivia Viney Acocella MD, PhD Pulmonary and Critical Care Medicine [facility] Pager: [phone]  12/29/2014, 12:08 AM

## 2014-12-29 NOTE — Progress Notes (Addendum)
PULMONARY / CRITICAL CARE MEDICINE   Name: [name] MRN: [id number] DOB: August 15, 1951    ADMISSION DATE:  12/28/2014 CONSULTATION DATE:  12/28/14   REFERRING MD :  Imogene BurnJenkins/TRH  PT PROFILE: obese [age] M admitted 4/22 by Ortho for elective R rotator cuff tear. Intubated early 4/23 AM for post operative hypercarbic/hypoxic resp failure  STUDIES:    MAJOR EVENTS/TEST RESULTS: 4/22 elective R shoulder surgery 4/23 reintubated for hypercarbic/hypoxic resp failure 4/23 US scrotum: Two solid extratesticular right scrotal masses. Primary differential diagnosis for extratesticular masses include primary or metastatic neoplasms including adenomatoid tumor, fibroma, leiomyoma, mesothelioma, or sarcoma. Urology consultation is recommended   INDWELLING DEVICES:: ETT 4/23 >>  L radial A-line 4/23 >>  PICC (ordered) 4/23 >>   MICRO DATA: PCT 4/24:  Urine 4/23 >>  Resp 4/23 >>    ANTIMICROBIALS:  Cefazolin 4/22 >> 4/23 Levofloxacin 4/23 >>   SUBJECTIVE:  RASS -3, -4. Not F/C. PEEP @ 8 cm H2O. FiO2 70%  VITAL SIGNS: Temp:  [96.8 F (36 C)-99.4 F (37.4 C)] 99 F (37.2 C) (04/23 0903) Pulse Rate:  [68-110] 75 (04/23 0900) Resp:  [11-23] 18 (04/23 0900) BP: (75-211)/(35-113) 95/47 mmHg (04/23 0300) SpO2:  [81 %-100 %] 93 % (04/23 0900) Arterial Line BP: (104-137)/(50-74) 116/65 mmHg (04/23 0900) FiO2 (%):  [70 %-100 %] 70 % (04/23 0900) Weight:  [172.82 kg (381 lb)] 172.82 kg (381 lb) (04/22 1856) HEMODYNAMICS:   VENTILATOR SETTINGS: Vent Mode:  [-] PRVC FiO2 (%):  [70 %-100 %] 70 % Set Rate:  [18 bmp] 18 bmp Vt Set:  [540 mL-600 mL] 600 mL PEEP:  [8 cmH20] 8 cmH20 Plateau Pressure:  [21 cmH20-24 cmH20] 23 cmH20 INTAKE / OUTPUT:  Intake/Output Summary (Last 24 hours) at 12/29/14 1326 Last data filed at 12/29/14 [id number]  Gross per 24 hour  Intake 2533.8 ml  Output   1325 ml  Net 1208.8 ml    PHYSICAL EXAMINATION: General: RASS -4, not F/C Neuro: MAEs HEENT: NCAT, very  short, thick neck Cardiovascular: Reg, no M Lungs: no wheezes Abdomen:  Obese, +BS, soft Ext: R shoulder surgical dressing, no edema  LABS:  CBC  Recent Labs Lab 12/25/14 1457  WBC 9.8  HGB 17.2*  HCT 54.2*  PLT 179   Coag's No results for input(s): APTT, INR in the last 168 hours. BMET  Recent Labs Lab 12/25/14 1457  NA 141  K 4.1  CL 103  CO2 32  BUN 11  CREATININE 0.87  GLUCOSE 179*   Electrolytes  Recent Labs Lab 12/25/14 1457  CALCIUM 8.8   Sepsis Markers  Recent Labs Lab 12/29/14 1106  PROCALCITON 0.69   ABG  Recent Labs Lab 12/28/14 2105 12/28/14 2320 12/29/14 0300  PHART 7.194* 7.158* 7.279*  PCO2ART 77.2* 97.0* 67.1*  PO2ART 76.3* 111.0* 110.0*   Liver Enzymes No results for input(s): AST, ALT, ALKPHOS, BILITOT, ALBUMIN in the last 168 hours. Cardiac Enzymes  Recent Labs Lab 12/28/14 2150 12/29/14 0452 12/29/14 1106  TROPONINI 0.05* 0.18* 0.17*   Glucose  Recent Labs Lab 12/28/14 2140 12/29/14 0032 12/29/14 0403 12/29/14 0854 12/29/14 1148  GLUCAP 171* 194* 200* 240* 194*    Imaging Dg Chest Port 1 View  12/28/2014   CLINICAL DATA:  Hypoxia.  Respiratory distress.  EXAM: PORTABLE CHEST - 1 VIEW  COMPARISON:  None.  FINDINGS: There is cardiomegaly. Pulmonary vascularity is normal. There appears to be atelectasis at the right lung base. No discrete effusions. No acute osseous abnormality.  IMPRESSION: Cardiomegaly.  Probable atelectasis at the right lung base.   Electronically Signed   By: Francene Boyers M.D.   On: 12/28/2014 21:58     ASSESSMENT / PLAN:  PULMONARY A:  Acute hypercarbic/hypoxemic resp failure RLL collapse vs consolidation 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 (Hgb 18, serum CO2 32)  Likely OHS/OSA P:   Cont full vent support - settings reviewed and/or adjusted Cont vent bundle Daily SBT if/when meets criteria  CARDIOVASCULAR A:  Hypotension, resolved Minimal elevation of  trop I - doubt ACS P:  Monitor BP and rhythm Cont cycling markers  RENAL/GU A:   Scrotal mass P:   Monitor BMET intermittently Monitor I/Os Correct electrolytes as indicated Urology consultation after acute critical phase of illness resolves  GASTROINTESTINAL A:  Obesity P:   SUP: enteral famotidine Begin TFs 4/23  HEMATOLOGIC A:   Polycythemia - likely chronic hypoxemia P:  DVT px: SQ heparin Monitor CBC intermittently Transfuse per usual ICU guidelines  INFECTIOUS A:   Suspect PNA P:  Micro and abx as above  ENDOCRINE A:  Hyperglycemia   P:   May need SSI with addition of steroids  NEUROLOGIC A:  Acute hypercarbic encephalopathy Post op pain ICU associated discomfort P:   RASS goal:-1, -2 Cont propofol and fentanyl gtt  CCM time: 35 mins  Billy Fischer, MD ; [facility] service Mobile [phone].  After 5:30 PM or weekends, call [phone]   12/29/2014, 1:26 PM

## 2014-12-29 NOTE — Progress Notes (Signed)
    Subjective: 1 Day Post-Op Procedure(s) (LRB): RIGHT SHOULDER ARTHROSCOPY WITH SUBACROMIAL DECOMPRESSION, DISTAL CLAVICLE RESECTION, ROTATOR CUFF REPAIR  (Right)  intubated and sedated due to post-op C)2 retension.    Objective: Vital signs in last 24 hours: Temp:  [96.8 F (36 C)-99.4 F (37.4 C)] 99.4 F (37.4 C) (04/23 0346) Pulse Rate:  [68-110] 81 (04/23 0730) Resp:  [11-23] 18 (04/23 0730) BP: (75-211)/(35-113) 95/47 mmHg (04/23 0300) SpO2:  [81 %-100 %] 92 % (04/23 0803) Arterial Line BP: (104-137)/(50-74) 137/74 mmHg (04/23 0730) FiO2 (%):  [70 %-100 %] 70 % (04/23 0803) Weight:  [168.284 kg (371 lb)-172.82 kg (381 lb)] 172.82 kg (381 lb) (04/22 1856)  Intake/Output from previous day: 04/22 0701 - 04/23 0700 In: 2433.8 [I.V.:1783.8; IV Piggyback:650] Out: 1250 [Urine:1250] Intake/Output this shift:    Labs: No results for input(s): HGB in the last 72 hours. No results for input(s): WBC, RBC, HCT, PLT in the last 72 hours. No results for input(s): NA, K, CL, CO2, BUN, CREATININE, GLUCOSE, CALCIUM in the last 72 hours. No results for input(s): LABPT, INR in the last 72 hours.  Physical Exam: ABD soft Intact pulses distally Incision: dressing C/D/I Compartment soft  Assessment/Plan: 1 Day Post-Op Procedure(s) (LRB): RIGHT SHOULDER ARTHROSCOPY WITH SUBACROMIAL DECOMPRESSION, DISTAL CLAVICLE RESECTION, ROTATOR CUFF REPAIR  (Right) Vent management per CCM Will continue to monitor  No active ortho issues at present - dressing C/D/I will plan on change in AM  [facility] Center LLCBROOKS,[name] D for Dr. Venita [name] [name] [name] [phone] 12/29/2014, 8:16 AM

## 2014-12-29 NOTE — Procedures (Signed)
Intubation Procedure Note [name] [id number] [birth year]  Procedure: Intubation Indications: Respiratory insufficiency  Procedure Details Consent: Unable to obtain consent because of altered level of consciousness. Time Out: Verified patient identification, verified procedure, site/side was marked, verified correct patient position, special equipment/implants available, medications/allergies/relevent history reviewed, required imaging and test results available.  Performed  Maximum sterile technique was used including gloves.  3 - glidescope    Evaluation Hemodynamic Status: BP stable throughout; O2 sats: transiently fell during during procedure Patient's Sar[place] Condition: stable Complications: No apparent complications Patient did tolerate procedure well. Chest X-ray ordered to verify placement.  CXR: pending.   [name]. 12/29/2014

## 2014-12-29 NOTE — Procedures (Signed)
Arterial Catheter Insertion Procedure Note Marjory LiesJames Lipinski [id number]    [birth year]  Procedure: Insertion of Arterial Catheter  Indications: Blood pressure monitoring and Frequent blood sampling  Procedure Details Consent: Unable to obtain consent because of altered level of consciousness. Time Out: Verified patient identification, verified procedure, site/side was marked, verified correct patient position, special equipment/implants available, medications/allergies/relevent history reviewed, required imaging and test results available.  Performed  Maximum sterile technique was used including antiseptics, cap, gloves, gown, hand hygiene, mask and sheet. Skin prep: Chlorhexidine; local anesthetic administered 20 gauge catheter was inserted into left radial artery using the Seldinger technique.  Evaluation Blood flow good; BP tracing good. Complications: No apparent complications.   Rulon EisenmengerJones, Azael Ragain K 12/29/2014

## 2014-12-29 NOTE — Progress Notes (Signed)
Peripherally Inserted Central Catheter/Midline Placement  The IV Nurse has discussed with the patient and/or persons authorized to consent for the patient, the purpose of this procedure and the potential benefits and risks involved with this procedure.  The benefits include less needle sticks, lab draws from the catheter and patient may be discharged home with the catheter.  Risks include, but not limited to, infection, bleeding, blood clot (thrombus formation), and puncture of an artery; nerve damage and irregular heat beat.  Alternatives to this procedure were also discussed.  Consent obtained via telephone from brother.  PICC/Midline Placement Documentation  PICC Triple Lumen 12/29/14 PICC Left Cephalic 49 cm 0 cm (Active)  Indication for Insertion or Continuance of Line Vasoactive infusions;Prolonged intravenous therapies;Limited venous access - need for IV therapy >5 days (PICC only) 12/29/2014 12:46 PM  Exposed Catheter (cm) 0 cm 12/29/2014 12:46 PM  Site Assessment Clean;Dry;Intact 12/29/2014 12:46 PM  Lumen #1 Status Flushed;Saline locked;Blood return noted 12/29/2014 12:46 PM  Lumen #2 Status Flushed;Saline locked;Blood return noted 12/29/2014 12:46 PM  Lumen #3 Status Flushed;Saline locked;Blood return noted 12/29/2014 12:46 PM  Dressing Type Transparent 12/29/2014 12:46 PM  Dressing Status Clean;Dry;Intact;Antimicrobial disc in place 12/29/2014 12:46 PM  Line Care Connections checked and tightened 12/29/2014 12:46 PM  Line Adjustment (NICU/IV Team Only) No 12/29/2014 12:46 PM  Dressing Intervention New dressing 12/29/2014 12:46 PM  Dressing Change Due 01/05/15 12/29/2014 12:46 PM       Elliot Dallyiggs, Shantea Poulton Wright 12/29/2014, 12:48 PM

## 2014-12-30 ENCOUNTER — Inpatient Hospital Stay (HOSPITAL_COMMUNITY): Payer: Worker's Compensation

## 2014-12-30 DIAGNOSIS — R739 Hyperglycemia, unspecified: Secondary | ICD-10-CM

## 2014-12-30 LAB — GLUCOSE, CAPILLARY
GLUCOSE-CAPILLARY: 124 mg/dL — AB (ref 70–99)
GLUCOSE-CAPILLARY: 145 mg/dL — AB (ref 70–99)
GLUCOSE-CAPILLARY: 146 mg/dL — AB (ref 70–99)
GLUCOSE-CAPILLARY: 163 mg/dL — AB (ref 70–99)
Glucose-Capillary: 134 mg/dL — ABNORMAL HIGH (ref 70–99)
Glucose-Capillary: 148 mg/dL — ABNORMAL HIGH (ref 70–99)
Glucose-Capillary: 153 mg/dL — ABNORMAL HIGH (ref 70–99)

## 2014-12-30 LAB — COMPREHENSIVE METABOLIC PANEL
ALBUMIN: 3.1 g/dL — AB (ref 3.5–5.2)
ALT: 34 U/L (ref 0–53)
AST: 50 U/L — ABNORMAL HIGH (ref 0–37)
Alkaline Phosphatase: 69 U/L (ref 39–117)
Anion gap: 8 (ref 5–15)
BUN: 20 mg/dL (ref 6–23)
CO2: 32 mmol/L (ref 19–32)
Calcium: 8.5 mg/dL (ref 8.4–10.5)
Chloride: 97 mmol/L (ref 96–112)
Creatinine, Ser: 0.91 mg/dL (ref 0.50–1.35)
GFR calc Af Amer: >90 mL/min (ref >90)
GFR calc non Af Amer: 90 mL/min — ABNORMAL LOW (ref >90)
GLUCOSE: 144 mg/dL — AB (ref 70–99)
Potassium: 4.4 mmol/L (ref 3.5–5.1)
Sodium: 137 mmol/L (ref 135–145)
TOTAL PROTEIN: 6.4 g/dL (ref 6.0–8.3)
Total Bilirubin: 1.1 mg/dL (ref 0.3–1.2)

## 2014-12-30 LAB — CBC
HEMATOCRIT: 49.6 % (ref 39.0–52.0)
Hemoglobin: 15.4 g/dL (ref 13.0–17.0)
MCH: 31.8 pg (ref 26.0–34.0)
MCHC: 31 g/dL (ref 30.0–36.0)
MCV: 102.3 fL — ABNORMAL HIGH (ref 78.0–100.0)
Platelets: 158 10*3/uL (ref 150–400)
RBC: 4.85 MIL/uL (ref 4.22–5.81)
RDW: 16.5 % — ABNORMAL HIGH (ref 11.5–15.5)
WBC: 14.1 10*3/uL — ABNORMAL HIGH (ref 4.0–10.5)

## 2014-12-30 LAB — PROCALCITONIN: Procalcitonin: 0.44 ng/mL

## 2014-12-30 MED ORDER — POTASSIUM CHLORIDE 20 MEQ/15ML (10%) PO SOLN
20.0000 meq | Freq: Four times a day (QID) | ORAL | Status: AC
  Administered 2014-12-30 (×2): 20 meq
  Filled 2014-12-30 (×2): qty 15

## 2014-12-30 MED ORDER — PRO-STAT SUGAR FREE PO LIQD
60.0000 mL | Freq: Four times a day (QID) | ORAL | Status: DC
  Administered 2014-12-30 – 2015-01-13 (×55): 60 mL via ORAL
  Filled 2014-12-30 (×77): qty 60

## 2014-12-30 MED ORDER — FOLIC ACID 1 MG PO TABS
1.0000 mg | ORAL_TABLET | Freq: Every day | ORAL | Status: DC
  Administered 2014-12-30 – 2015-01-13 (×15): 1 mg
  Filled 2014-12-30 (×15): qty 1

## 2014-12-30 MED ORDER — FUROSEMIDE 10 MG/ML IJ SOLN
40.0000 mg | Freq: Four times a day (QID) | INTRAMUSCULAR | Status: AC
  Administered 2014-12-30 (×2): 40 mg via INTRAVENOUS
  Filled 2014-12-30 (×2): qty 4

## 2014-12-30 MED ORDER — BUDESONIDE 0.25 MG/2ML IN SUSP
0.2500 mg | Freq: Four times a day (QID) | RESPIRATORY_TRACT | Status: DC
  Administered 2014-12-30 – 2015-01-05 (×24): 0.25 mg via RESPIRATORY_TRACT
  Filled 2014-12-30 (×25): qty 2

## 2014-12-30 MED ORDER — VITAL HIGH PROTEIN PO LIQD
1000.0000 mL | ORAL | Status: DC
  Administered 2014-12-30 – 2015-01-02 (×4): 1000 mL
  Filled 2014-12-30 (×7): qty 1000

## 2014-12-30 NOTE — Progress Notes (Signed)
Subjective: 2 Days Post-Op Procedure(s) (LRB): RIGHT SHOULDER ARTHROSCOPY WITH SUBACROMIAL DECOMPRESSION, DISTAL CLAVICLE RESECTION, ROTATOR CUFF REPAIR  (Right) Patient reports pain as intubated.    Objective: Vital signs in last 24 hours: Temp:  [98 F (36.7 [name])-99.5 F (37.5 [name])] 98.6 F (37 [name]) (04/24 0824) Pulse Rate:  [39-87] 71 (04/24 0600) Resp:  [11-19] 14 (04/24 0600) BP: (96-112)/(80-93) 96/80 mmHg (04/24 0400) SpO2:  [87 %-97 %] 93 % (04/24 0812) Arterial Line BP: (90-134)/(63-96) 117/81 mmHg (04/24 0735) FiO2 (%):  [70 %-100 %] 80 % (04/24 0814)  Intake/Output from previous day: 04/23 0701 - 04/24 0700 In: 1412.8 [I.V.:1162.8; IV Piggyback:250] Out: 1000 [Urine:1000] Intake/Output this shift: Total I/O In: -  Out: 40 [Urine:40]   Recent Labs  12/30/14 0548  HGB 15.4    Recent Labs  12/30/14 0548  WBC 14.1*  RBC 4.85  HCT 49.6  PLT 158    Recent Labs  12/30/14 0548  NA 137  K 4.4  CL 97  CO2 32  BUN 20  CREATININE 0.91  GLUCOSE 144*  CALCIUM 8.5   No results for input(s): LABPT, INR in the last 72 hours.  Incision: wound benign No cellulitis present Compartment soft Pulses intact. Splint loose Assessment/Plan: 2 Days Post-Op Procedure(s) (LRB): RIGHT SHOULDER ARTHROSCOPY WITH SUBACROMIAL DECOMPRESSION, DISTAL CLAVICLE RESECTION, ROTATOR CUFF REPAIR  (Right) Intubated with vent per ICU. Continue to moniter  [name] 12/30/2014, 9:27 AM

## 2014-12-30 NOTE — Progress Notes (Signed)
This RT notified Dr Deterding at [facility] about pt requiring increased O2 demand since beginning of my night shift (from 70% - 100% with +8 peep to maintain spo2 92% as per rx).  Per MD, ok with spo2 90% or greater and no increase of peep at this time.  RN notified.

## 2014-12-30 NOTE — Progress Notes (Signed)
PULMONARY / CRITICAL CARE MEDICINE   Name: [name] MRN: [id number] DOB: 1951-07-23    ADMISSION DATE:  12/28/2014 CONSULTATION DATE:  12/28/14   REFERRING MD :  [name]  PT PROFILE: obese 23 M admitted 4/22 by Ortho for elective R rotator cuff tear. Intubated early 4/23 AM for post operative hypercarbic/hypoxic resp failure  STUDIES:    MAJOR EVENTS/TEST RESULTS: 4/22 elective R shoulder surgery 4/23 reintubated for hypercarbic/hypoxic resp failure 4/23 US scrotum: Two solid extratesticular right scrotal masses. Primary differential diagnosis for extratesticular masses include primary or metastatic neoplasms including adenomatoid tumor, fibroma, leiomyoma, mesothelioma, or sarcoma. Urology consultation is recommended   INDWELLING DEVICES:: ETT 4/23 >>  L radial A-line 4/23 >>  PICC (ordered) 4/23 >>   MICRO DATA: PCT 4/23: 0.69, 4/24: 0.44, 4/25:  Urine 4/23 >>  Resp 4/23 >>    ANTIMICROBIALS:  Cefazolin 4/22 >> 4/23 Levofloxacin 4/23 >>   SUBJECTIVE:  RASS -4. Not F/C. PEEP @ 8 cm H2O. FiO2 80%  VITAL SIGNS: Temp:  [98 F (36.7 C)-99.5 F (37.5 C)] 98.5 F (36.9 C) (04/24 1200) Pulse Rate:  [39-87] 80 (04/24 1257) Resp:  [9-19] 9 (04/24 1257) BP: (96-159)/(80-93) 159/85 mmHg (04/24 1257) SpO2:  [87 %-97 %] 95 % (04/24 1257) Arterial Line BP: (90-128)/(76-96) 117/81 mmHg (04/24 0735) FiO2 (%):  [70 %-100 %] 70 % (04/24 1257) HEMODYNAMICS:   VENTILATOR SETTINGS: Vent Mode:  [-] PRVC FiO2 (%):  [70 %-100 %] 70 % Set Rate:  [14 bmp] 14 bmp Vt Set:  [600 mL] 600 mL PEEP:  [8 cmH20] 8 cmH20 Plateau Pressure:  [22 cmH20-29 cmH20] 26 cmH20 INTAKE / OUTPUT:  Intake/Output Summary (Last 24 hours) at 12/30/14 1414 Last data filed at 12/30/14 1312  Gross per 24 hour  Intake 1197.5 ml  Output   2325 ml  Net -1127.5 ml    PHYSICAL EXAMINATION: General: RASS -4, not F/C Neuro: MAEs HEENT: NCAT, very short, thick neck Cardiovascular: Reg, no M Lungs:  no wheezes Abdomen:  Obese, +BS, soft Ext: R shoulder surgical dressing, no edema  LABS:  CBC  Recent Labs Lab 12/25/14 1457 12/30/14 0548  WBC 9.8 14.1*  HGB 17.2* 15.4  HCT 54.2* 49.6  PLT 179 158   Coag's No results for input(s): APTT, INR in the last 168 hours. BMET  Recent Labs Lab 12/25/14 1457 12/30/14 0548  NA 141 137  K 4.1 4.4  CL 103 97  CO2 32 32  BUN 11 20  CREATININE 0.87 0.91  GLUCOSE 179* 144*   Electrolytes  Recent Labs Lab 12/25/14 1457 12/30/14 0548  CALCIUM 8.8 8.5   Sepsis Markers  Recent Labs Lab 12/29/14 1106 12/30/14 0548  PROCALCITON 0.69 0.44   ABG  Recent Labs Lab 12/28/14 2105 12/28/14 2320 12/29/14 0300  PHART 7.194* 7.158* 7.279*  PCO2ART 77.2* 97.0* 67.1*  PO2ART 76.3* 111.0* 110.0*   Liver Enzymes  Recent Labs Lab 12/30/14 0548  AST 50*  ALT 34  ALKPHOS 69  BILITOT 1.1  ALBUMIN 3.1*   Cardiac Enzymes  Recent Labs Lab 12/28/14 2150 12/29/14 0452 12/29/14 1106  TROPONINI 0.05* 0.18* 0.17*   Glucose  Recent Labs Lab 12/29/14 1555 12/29/14 2010 12/30/14 0021 12/30/14 0547 12/30/14 0754 12/30/14 1230  GLUCAP 186* 127* 145* 163* 148* 124*    CXR: CM, improved RLL aeration, edema pattern, LLL air bronchograms     ASSESSMENT / PLAN:  PULMONARY A:  Acute hypercarbic/hypoxemic resp failure RLL ATX - improved Suspect pulm  edema Possible LLL PNA 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 (Hgb 18, serum CO2 32)  Likely OHS/OSA Anticipate difficult airway due to body habitus P:   Cont full vent support - settings reviewed and/or adjusted Cont vent bundle Daily SBT if/when meets criteria  CARDIOVASCULAR A:  Hypotension, resolved Minimal elevation of trop I - doubt ACS P:  Monitor BP and rhythm No cardiac W/U indicated @ present  RENAL/GU A:   Scrotal mass P:   Monitor BMET intermittently Monitor I/Os Correct electrolytes as indicated Urology consultation after  acute critical phase of illness resolves  GASTROINTESTINAL A:  Obesity P:   SUP: enteral famotidine Cont TFs  HEMATOLOGIC A:   Polycythemia - likely chronic hypoxemia P:  DVT px: SQ heparin Monitor CBC intermittently Transfuse per usual ICU guidelines  INFECTIOUS A:   Suspect LLL PNA P:  Micro and abx as above  ENDOCRINE A:  Hyperglycemia, controlled   P:   SSI initiated 4/23 - continue  NEUROLOGIC A:  Acute hypercarbic encephalopathy Post op pain s/p R rotator cuff surgery ICU associated discomfort P:   RASS goal:-1, -2 Cont propofol and fentanyl gtt   Discussed with Dr Shelle IronBeane CCM time: 35 mins  Nicholas Fischeravid Wendel Homeyer, MD ; [facility] service Mobile [phone].  After 5:30 PM or weekends, call [phone]   12/30/2014, 2:14 PM

## 2014-12-30 NOTE — Care Management Note (Signed)
CARE MANAGEMENT NOTE 12/30/2014  Patient:  Nicholas LiesCURRENT,Abner   Account Number:  [id number]  Date Initiated:  12/30/2014  Documentation initiated by:  DAVIS,RHONDA  Subjective/Objective Assessment:   pt became acute resp failure after rotator cuff repair requiring cpap then bipap and finally requed intubation     Action/Plan:   home when stable   Anticipated DC Date:  01/02/2015   Anticipated DC Plan:  HOME/SELF CARE  In-house referral  NA      DC Planning Services  CM consult      PAC Choice  NA   Choice offered to / List presented to:  NA      DME agency  NA     HH arranged  NA      HH agency  NA   Status of service:  In process, will continue to follow Medicare Important Message given?   (If response is "NO", the following Medicare IM given date fields will be blank) Date Medicare IM given:   Medicare IM given by:   Date Additional Medicare IM given:   Additional Medicare IM given by:    Discharge Disposition:    Per UR Regulation:  Reviewed for med. necessity/level of care/duration of stay  If discussed at Long Length of Stay Meetings, dates discussed:    Comments:  December 30, 2014/Rhonda L. Earlene Plateravis, RN, BSN, CCM. Case Management [facility] [employer] [phone] No discharge needs present of time of review.

## 2014-12-30 NOTE — Progress Notes (Addendum)
INITIAL NUTRITION ASSESSMENT  DOCUMENTATION CODES Per approved criteria  -Morbid Obesity   INTERVENTION: Decrease Vital HP to 10 ml/hr via NGT. 60 ml Prostat QID.    Tube feeding regimen +Putzier Propofol infusion provides 1998 kcal (73% of needs), 141 grams of protein, and 201 ml of H2O.   Unable to meet protein needs d/t Rogalski propofol infusion rate.  RD to continue to monitor  NUTRITION DIAGNOSIS: Inadequate oral intake related to inability to eat as evidenced by NPO status.   Goal: Enteral nutrition to provide 65-70% of estimated calorie needs based on ASPEN guidelines for hypocaloric, high protein feeding in critically ill obese individuals  Monitor:  TF regimen & tolerance, respiratory status, weight, labs, I/O's  Reason for Assessment: Consult received to adjust and manage enteral nutrition support.  Admitting Dx: Acute encephalopathy  ASSESSMENT: 64 y/o with h/o HTN who was admitted to step down after difficulty extubating after rotator cuff repair. Has a 44 pack year smoking hx but no formal COPD diagnosis. 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  S/p 4/22 Procedure(s) (LRB): RIGHT SHOULDER ARTHROSCOPY WITH SUBACROMIAL DECOMPRESSION, DISTAL CLAVICLE RESECTION, ROTATOR CUFF REPAIR (Right)  Patient is currently intubated on ventilator support MV: 7.1 L/min Temp (24hrs), Avg:98.8 F (37.1 C), Min:98 F (36.7 C), Max:99.5 F (37.5 C)  Propofol: 36.3 ml/hr ->providing 958 fat kcal  Patient shows no sign of depletion of muscle mass or body fat.  Labs reviewed: WNL  Height: Ht Readings from Last 1 Encounters:  12/29/14  (1.803 m)    Weight: Wt Readings from Last 1 Encounters:  12/28/14 381 lb (172.82 kg)    Ideal Body Weight: 172 lb  % Ideal Body Weight: 221%  Wt Readings from Last 10 Encounters:  12/28/14 381 lb (172.82  kg)  11/29/14 360 lb 3.2 oz (163.386 kg)   BMI:  Body mass index is 53.16 kg/(m^2).  Estimated Nutritional Needs: Kcal: 2724              1718- 1952 (22-25 kcal/IBW) Protein: 170-185g Fluid: 1.5L/day  Skin: shoulder incision, scrotum wound  Diet Order: Diet - low sodium heart healthy  EDUCATION NEEDS: -No education needs identified at this time   Intake/Output Summary (Last 24 hours) at 12/30/14 0941 Last data filed at 12/30/14 0733  Gross per 24 hour  Intake 1252.53 ml  Output    965 ml  Net 287.53 ml    Last BM: 4/23   Labs:   Recent Labs Lab 12/25/14 1457 12/30/14 0548  NA 141 137  K 4.1 4.4  CL 103 97  CO2 32 32  BUN 11 20  CREATININE 0.87 0.91  CALCIUM 8.8 8.5  GLUCOSE 179* 144*    CBG (last 3)   Recent Labs  12/30/14 0021 12/30/14 0547 12/30/14 0754  GLUCAP 145* 163* 148*    Scheduled Meds: . antiseptic oral rinse  7 mL Mouth Rinse QID  . aspirin  325 mg Oral Daily  . chlorhexidine  15 mL Mouth Rinse BID  . famotidine  20 mg Per Tube BID  . feeding supplement (VITAL HIGH PROTEIN)  1,000 mL Per Tube Q24H  . free water  200 mL Per Tube 3 times per day  . heparin subcutaneous  5,000 Units Subcutaneous 3 times per day  . insulin aspart  0-15 Units Subcutaneous 6 times per day  . ipratropium-albuterol  3 mL  Nebulization Q6H  . levofloxacin (LEVAQUIN) IV  750 mg Intravenous Q24H  . sodium chloride  10-40 mL Intracatheter Q12H    Continuous Infusions: . sodium chloride 10 mL (12/30/14 0010)  . fentaNYL infusion INTRAVENOUS 50 mcg/hr (12/30/14 0728)  . propofol (DIPRIVAN) infusion 20 mcg/kg/min (12/30/14 [id number])    Past Medical History  Diagnosis Date  . Arthritis   . RCT (rotator cuff tear)   . Multiple abrasions     rt arm  . Morbid obesity     Past Surgical History  Procedure Laterality Date  . No past surgeries      Tilda FrancoLindsey Sequoia Witz, MS, RD, LDN Pager: [phone] After Hours Pager: [phone]

## 2014-12-31 ENCOUNTER — Inpatient Hospital Stay (HOSPITAL_COMMUNITY): Payer: Worker's Compensation

## 2014-12-31 ENCOUNTER — Encounter (HOSPITAL_COMMUNITY): Payer: Self-pay | Admitting: Specialist

## 2014-12-31 DIAGNOSIS — Z9889 Other specified postprocedural states: Secondary | ICD-10-CM

## 2014-12-31 DIAGNOSIS — J9601 Acute respiratory failure with hypoxia: Secondary | ICD-10-CM

## 2014-12-31 DIAGNOSIS — G9340 Encephalopathy, unspecified: Secondary | ICD-10-CM

## 2014-12-31 LAB — PROCALCITONIN: Procalcitonin: 0.46 ng/mL

## 2014-12-31 LAB — CULTURE, RESPIRATORY W GRAM STAIN: Culture: NO GROWTH

## 2014-12-31 LAB — URINE CULTURE
COLONY COUNT: NO GROWTH
Culture: NO GROWTH

## 2014-12-31 LAB — HEMOGLOBIN A1C
HEMOGLOBIN A1C: 7.5 % — AB (ref 4.8–5.6)
MEAN PLASMA GLUCOSE: 169 mg/dL

## 2014-12-31 LAB — CBC
HEMATOCRIT: 48.8 % (ref 39.0–52.0)
Hemoglobin: 15.6 g/dL (ref 13.0–17.0)
MCH: 32.4 pg (ref 26.0–34.0)
MCHC: 32 g/dL (ref 30.0–36.0)
MCV: 101.2 fL — AB (ref 78.0–100.0)
Platelets: 144 10*3/uL — ABNORMAL LOW (ref 150–400)
RBC: 4.82 MIL/uL (ref 4.22–5.81)
RDW: 16.3 % — ABNORMAL HIGH (ref 11.5–15.5)
WBC: 9.5 10*3/uL (ref 4.0–10.5)

## 2014-12-31 LAB — GLUCOSE, CAPILLARY
GLUCOSE-CAPILLARY: 133 mg/dL — AB (ref 70–99)
GLUCOSE-CAPILLARY: 153 mg/dL — AB (ref 70–99)
Glucose-Capillary: 158 mg/dL — ABNORMAL HIGH (ref 70–99)
Glucose-Capillary: 152 mg/dL — ABNORMAL HIGH (ref 70–99)

## 2014-12-31 LAB — BASIC METABOLIC PANEL
ANION GAP: 6 (ref 5–15)
BUN: 21 mg/dL (ref 6–23)
CO2: 36 mmol/L — AB (ref 19–32)
Calcium: 8.7 mg/dL (ref 8.4–10.5)
Chloride: 96 mmol/L (ref 96–112)
Creatinine, Ser: 0.83 mg/dL (ref 0.50–1.35)
GFR calc non Af Amer: >90 mL/min (ref >90)
Glucose, Bld: 141 mg/dL — ABNORMAL HIGH (ref 70–99)
Potassium: 4.3 mmol/L (ref 3.5–5.1)
Sodium: 138 mmol/L (ref 135–145)

## 2014-12-31 LAB — CULTURE, RESPIRATORY

## 2014-12-31 MED ORDER — FUROSEMIDE 10 MG/ML IJ SOLN
40.0000 mg | Freq: Two times a day (BID) | INTRAMUSCULAR | Status: AC
  Administered 2014-12-31 – 2015-01-01 (×2): 40 mg via INTRAVENOUS
  Filled 2014-12-31 (×2): qty 4

## 2014-12-31 MED ORDER — FREE WATER
100.0000 mL | Freq: Three times a day (TID) | Status: DC
  Administered 2014-12-31 – 2015-01-06 (×18): 100 mL

## 2014-12-31 MED ORDER — POTASSIUM CHLORIDE 20 MEQ/15ML (10%) PO SOLN
20.0000 meq | Freq: Two times a day (BID) | ORAL | Status: AC
  Administered 2014-12-31 (×2): 20 meq
  Filled 2014-12-31 (×2): qty 15

## 2014-12-31 MED ORDER — METOCLOPRAMIDE HCL 5 MG/ML IJ SOLN
10.0000 mg | Freq: Four times a day (QID) | INTRAMUSCULAR | Status: DC
  Administered 2014-12-31 – 2015-01-02 (×7): 10 mg via INTRAVENOUS
  Filled 2014-12-31 (×7): qty 2

## 2014-12-31 NOTE — Progress Notes (Signed)
PULMONARY / CRITICAL CARE MEDICINE   Name: [name] MRN: [id number] DOB: [birth year]    ADMISSION DATE:  12/28/2014 CONSULTATION DATE:  12/28/14   REFERRING MD :  [name]  PT PROFILE:  29 morbidly obese male admitted 4/22 by Ortho for elective R rotator cuff tear. Intubated early 4/23 AM for post operative hypercarbic/hypoxic resp failure.  STUDIES:    MAJOR EVENTS/TEST RESULTS: 4/22  elective R shoulder surgery 4/23  reintubated for hypercarbic/hypoxic resp failure 4/23  US scrotum: Two solid extratesticular right scrotal masses. Primary differential diagnosis for extratesticular masses include primary or metastatic neoplasms including adenomatoid tumor, fibroma, leiomyoma, mesothelioma, or sarcoma. Urology consultation is recommended  4/24  MV support, 8 PEEP, 80%.  Sedate.   INDWELLING DEVICES:: ETT 4/23 >>  L radial A-line 4/23 >>  PICC (ordered) 4/23 >>   MICRO DATA: PCT 4/23: 0.69, 4/24: 0.44, 4/25: 0.46 Urine 4/23 >> neg Resp 4/23 >>    ANTIMICROBIALS:  Cefazolin 4/22 >> 4/23 Levofloxacin 4/23 >>   SUBJECTIVE:   Pt remains on MV support, 65% / 8 PEEP.  RN reports pt follows commands on WUA then developed agitation and gtts turned back on.  Pt with high residuals overnight.   VITAL SIGNS: Temp:  [98.5 F (36.9 C)-99.6 F (37.6 C)] 99.6 F (37.6 C) (04/25 0800) Pulse Rate:  [73-95] 73 (04/25 1100) Resp:  [9-23] 12 (04/25 1100) BP: (128-159)/(68-104) 142/77 mmHg (04/25 1100) SpO2:  [85 %-100 %] 100 % (04/25 1100) FiO2 (%):  [65 %-70 %] 65 % (04/25 1100)   HEMODYNAMICS:     VENTILATOR SETTINGS: Vent Mode:  [-] PRVC FiO2 (%):  [65 %-70 %] 65 % Set Rate:  [14 bmp] 14 bmp Vt Set:  [600 mL] 600 mL PEEP:  [8 cmH20] 8 cmH20 Plateau Pressure:  [20 cmH20-26 cmH20] 20 cmH20   INTAKE / OUTPUT:  Intake/Output Summary (Last 24 hours) at 12/31/14 1103 Last data filed at 12/31/14 1100  Gross per 24 hour  Intake 2655.08 ml  Output   4185 ml  Net -1529.92  ml    PHYSICAL EXAMINATION: General: Morbidly obese male on vent in NAD Neuro: Sedate, follows commands on WUA HEENT: NCAT, very short, thick neck Cardiovascular: Reg, no M Lungs: no wheezes Abdomen:  Obese, +BS, soft Ext: R shoulder surgical dressing, no edema, sling in place  LABS:  CBC  Recent Labs Lab 12/25/14 1457 12/30/14 0548 12/31/14 0400  WBC 9.8 14.1* 9.5  HGB 17.2* 15.4 15.6  HCT 54.2* 49.6 48.8  PLT 179 158 144*   Coag's No results for input(s): APTT, INR in the last 168 hours.   BMET  Recent Labs Lab 12/25/14 1457 12/30/14 0548 12/31/14 0400  NA 141 137 138  K 4.1 4.4 4.3  CL 103 97 96  CO2 32 32 36*  BUN CREATININE 0.87 0.91 0.83  GLUCOSE 179* 144* 141*   Electrolytes  Recent Labs Lab 12/25/14 1457 12/30/14 0548 12/31/14 0400  CALCIUM 8.8 8.5 8.7   Sepsis Markers  Recent Labs Lab 12/29/14 1106 12/30/14 0548 12/31/14 0400  PROCALCITON 0.69 0.44 0.46   ABG  Recent Labs Lab 12/28/14 2105 12/28/14 2320 12/29/14 0300  PHART 7.194* 7.158* 7.279*  PCO2ART 77.2* 97.0* 67.1*  PO2ART 76.3* 111.0* 110.0*   Liver Enzymes  Recent Labs Lab 12/30/14 0548  AST 50*  ALT 34  ALKPHOS 69  BILITOT 1.1  ALBUMIN 3.1*   Cardiac Enzymes  Recent Labs Lab 12/28/14 2150 12/29/14 9147  12/29/14 1106  TROPONINI 0.05* 0.18* 0.17*   Glucose  Recent Labs Lab 12/30/14 1230 12/30/14 1630 12/30/14 1956 12/30/14 2338 12/31/14 0341 12/31/14 0724  GLUCAP 124* 146* 153* 134* 158* 133*    CXR: 4/25 - cardiomegaly, low lung volumes, edema, small effusion on L    ASSESSMENT / PLAN:  PULMONARY A:  Acute hypercarbic/hypoxemic Resp Failure RLL ATX - improved Pulmonary Edema  Possible LLL PNA vs Effusion  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 (Hgb 18, serum CO2 32) Likely OHS/OSA Anticipate difficult airway due to body habitus P:   Cont MV support  Wean PEEP/FiO2 for sats > 90% Daily SBT / WUA  Lasix  40 mg BID x2 doses  Trend CXR  Pulmicort Q6  CARDIOVASCULAR A:  Hypotension - resolved Minimal elevation of trop I - doubt ACS Hx HTN P:  Monitor BP and rhythm No cardiac W/U indicated @ present PRN hydralazine  ASA 325 QD  RENAL/GU A:   Scrotal mass P:   Monitor BMET intermittently Monitor I/Os Correct electrolytes as indicated Urology consultation after acute critical phase of illness resolves for scrotal mass Reduce free water to 100 Q8  GASTROINTESTINAL A:  Obesity Vent Associated Dysphagia - high residuals for TF P:   SUP: enteral famotidine Cont TFs Monitor residuals, may need reglan    HEMATOLOGIC A:   Polycythemia - likely chronic hypoxemia Macrocytosis  P:  DVT px: SQ heparin Monitor CBC intermittently Transfuse per usual ICU guidelines Folate  INFECTIOUS A:   Suspect LLL PNA  P:  Micro and abx as above  ENDOCRINE A:   Hyperglycemia, controlled   P:   SSI  Q4 CBG  NEUROLOGIC A:  Acute Hypercarbic Encephalopathy Post op pain s/p R rotator cuff surgery ICU associated discomfort P:   RASS goal: -1 Cont propofol and fentanyl gtt PT when able  Defer further post-op care to Ortho  Daily WUA / SBT   GLOBAL:  Brother called and updated on patients status 4/25 am.  Questions answered.  No other family at bedside.   Canary BrimBrandi Ollis, NP-C [facility] Pgr: [phone] or [phone] 12/31/2014, 11:03 AM   Attending:  I have seen and examined the patient with nurse practitioner/resident and agree with the note above.   Sedated, comfortable on vent Lungs with crackles in bases, morbidly obese, endotracheal tube in good position  Chest imaging reviewed by basilar airspace disease predominantly with interstitial edema, no clear consolidation  Acute respiratory failure with hypoxemia> most likely picture is secondary to pulmonary edema and setting of diastolic heart failure, less likely pneumonia as no fever or chills, plan  will be to continue diuresis, maintain antibiotics for now. May stop antibiotics tomorrow. I will order an echocardiogram to assess LV function.  Rest as per note above  My CC time 35 min  Heber CarolinaBrent McQuaid, MD [facility] Pager: [phone] Cell: [phone] If no response, call [phone]

## 2014-12-31 NOTE — Progress Notes (Signed)
Subjective: 3 Days Post-Op Procedure(s) (LRB): RIGHT SHOULDER ARTHROSCOPY WITH SUBACROMIAL DECOMPRESSION, DISTAL CLAVICLE RESECTION, ROTATOR CUFF REPAIR  (Right) Patient lying in ICU bed on Vent.  Seems to comfortable on Fentanyl and  Propofol gtt.  Nursing reports a good night. No signs of distress or new complications.  Objective: Vital signs in last 24 hours: Temp:  [98.5 F (36.9 C)-99.3 F (37.4 C)] 99.3 F (37.4 C) (04/25 0400) Pulse Rate:  [76-87] 82 (04/25 0600) Resp:  [9-23] 13 (04/25 0600) BP: (128-159)/(70-89) 128/70 mmHg (04/25 0600) SpO2:  [85 %-95 %] 91 % (04/25 0600) Arterial Line BP: (117)/(81) 117/81 mmHg (04/24 0735) FiO2 (%):  [65 %-90 %] 65 % (04/25 0416)  Intake/Output from previous day: 04/24 0701 - 04/25 0700 In: 2596.5 [I.V.:1276.5; NG/GT:1170; IV Piggyback:150] Out: 3975 [Urine:3975] Intake/Output this shift:     Recent Labs  12/30/14 0548 12/31/14 0400  HGB 15.4 15.6    Recent Labs  12/30/14 0548 12/31/14 0400  WBC 14.1* 9.5  RBC 4.85 4.82  HCT 49.6 48.8  PLT 158 144*    Recent Labs  12/30/14 0548 12/31/14 0400  NA 137 138  K 4.4 4.3  CL 97 96  CO2 32 36*  BUN 20 21  CREATININE 0.91 0.83  GLUCOSE 144* 141*  CALCIUM 8.5 8.7   No results for input(s): LABPT, INR in the last 72 hours.  Pt resting comfortable in bed. He is intubated and sedation for comfort. Right Sling in place. Dressing changed today. Incisions well approximated and no spreading redness for drainage.  2+ right radial pulse. Good capillary refill.   Assessment/Plan: 3 Days Post-Op Procedure(s) (LRB): RIGHT SHOULDER ARTHROSCOPY WITH SUBACROMIAL DECOMPRESSION, DISTAL CLAVICLE RESECTION, ROTATOR CUFF REPAIR  (Right) Continue Sling Dressing changed today Doing well from ortho stand point. Continue to monitor Family updated (his brother Annette StableBill), questions encouraged and answered.   Morbid obesity: Recommend weight loss after hospital D/c  Recommend smoking  cessation   Continue recommendation from Critical care and Internal medicine, We appreciate there assistance.    STILWELL, BRYSON L 12/31/2014, 7:15 AM

## 2014-12-31 NOTE — Progress Notes (Signed)
Per night RN, high residuals from tube feeding. Feeding turned off per e-link nurse and to be addressed in the morning.

## 2014-12-31 NOTE — Progress Notes (Signed)
NUTRITION FOLLOW-UP  INTERVENTION: -Recommend Reglan or another prokinetic agent -Continue Vital HP to 10 ml/hr via NGT. -60 ml Prostat QID.    Tube feeding regimen +Sanchez Propofol infusion provides 1724 kcal (61% of needs), 141 grams of protein, and 201 ml of H2O.   Unable to meet protein needs d/t Iacobucci propofol infusion rate.  RD to continue to monitor  NUTRITION DIAGNOSIS: Inadequate oral intake related to inability to eat as evidenced by NPO status, ongoing.  Goal: Enteral nutrition to provide 65-70% of estimated calorie needs based on ASPEN guidelines for hypocaloric, high protein feeding in critically ill obese individuals  Monitor:  TF regimen & tolerance, respiratory status, weight, labs, I/O's  Admitting Dx: Acute encephalopathy  ASSESSMENT: 64 y/o with h/o HTN who was admitted to step down after difficulty extubating after rotator cuff repair. Has a 44 pack year smoking hx but no formal COPD diagnosis. 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  S/p 4/22 Procedure(s) (LRB): RIGHT SHOULDER ARTHROSCOPY WITH SUBACROMIAL DECOMPRESSION, DISTAL CLAVICLE RESECTION, ROTATOR CUFF REPAIR (Right)  RN informed RD that pt has had gastric residuals of 600 overnight and residuals are still in the 300s today. TF was held and then restarted at 10 ml/hr. Residuals: 375 ml at 1400. Per MD note, considering ordering Reglan. Recommend Reglan or another prokinetic agent be administered. RN to contact MD regarding order.  Patient is currently intubated on ventilator support MV: 9.3 L/min Temp (24hrs), Avg:99.3 F (37.4 C), Min:98.8 F (37.1 C), Max:99.6 F (37.6 C)  Propofol: 25.9 ml/hr ->providing 684 fat kcal  Labs reviewed: WNL  Height: Ht Readings from Last 1 Encounters:  12/29/14  (1.803 m)    Weight: Wt Readings from Last 1 Encounters:   12/28/14 381 lb (172.82 kg)    Ideal Body Weight: 172 lb  % Ideal Body Weight: 221%  Wt Readings from Last 10 Encounters:  12/28/14 381 lb (172.82 kg)  11/29/14 360 lb 3.2 oz (163.386 kg)   BMI:  Body mass index is 53.16 kg/(m^2).  Re-estimated Nutritional Needs: Kcal: 2809              1720- 1955 (22-25 kcal/IBW) Protein: 170-185g Fluid: 1.5L/day  Skin: shoulder incision, scrotum wound  Diet Order: Diet - low sodium heart healthy  EDUCATION NEEDS: -No education needs identified at this time   Intake/Output Summary (Last 24 hours) at 12/31/14 1349 Last data filed at 12/31/14 1300  Gross per 24 hour  Intake 2274.28 ml  Output   2925 ml  Net -650.72 ml    Last BM: 4/22  Labs:   Recent Labs Lab 12/25/14 1457 12/30/14 0548 12/31/14 0400  NA 141 137 138  K 4.1 4.4 4.3  CL 103 97 96  CO2 32 32 36*  BUN CREATININE 0.87 0.91 0.83  CALCIUM 8.8 8.5 8.7  GLUCOSE 179* 144* 141*    CBG (last 3)   Recent Labs  12/31/14 0341 12/31/14 0724 12/31/14 1134  GLUCAP 158* 133* 152*    Scheduled Meds: . antiseptic oral rinse  7 mL Mouth Rinse QID  . aspirin  325 mg Oral Daily  . budesonide  0.25 mg Nebulization 4 times per day  . chlorhexidine  15 mL Mouth Rinse BID  . famotidine  20 mg Per Tube BID  . feeding supplement (PRO-STAT SUGAR FREE 64)  60 mL Oral QID  .  feeding supplement (VITAL HIGH PROTEIN)  1,000 mL Per Tube Q24H  . folic acid  1 mg Per Tube Daily  . free water  100 mL Per Tube 3 times per day  . furosemide  40 mg Intravenous BID  . heparin subcutaneous  5,000 Units Subcutaneous 3 times per day  . insulin aspart  0-15 Units Subcutaneous 6 times per day  . ipratropium-albuterol  3 mL Nebulization Q6H  . levofloxacin (LEVAQUIN) IV  750 mg Intravenous Q24H  . potassium chloride  20 mEq Per Tube BID  . sodium chloride  10-40 mL Intracatheter Q12H    Continuous Infusions: . sodium chloride 10 mL/hr (12/30/14 1016)  . fentaNYL infusion  INTRAVENOUS 100 mcg/hr (12/31/14 1300)  . propofol (DIPRIVAN) infusion 25 mcg/kg/min (12/31/14 1300)    Tilda FrancoLindsey Abb Gobert, MS, RD, LDN Pager: [phone] After Hours Pager: [phone]

## 2014-12-31 NOTE — Progress Notes (Signed)
eLink Physician-Brief Progress Note Patient Name: [name] DOB: 28-Apr-1951 MRN: [id number]   Date of Service  12/31/2014  HPI/Events of Note  Very high enteric feeding residuals.   eICU Interventions  Will order Reglan 10 mg IV Q 6 hours.      Intervention Category Minor Interventions: Routine modifications to care plan (e.g. PRN medications for pain, fever)  Sommer,Steven Eugene 12/31/2014, 3:25 PM

## 2015-01-01 ENCOUNTER — Inpatient Hospital Stay (HOSPITAL_COMMUNITY): Payer: Worker's Compensation

## 2015-01-01 DIAGNOSIS — I5031 Acute diastolic (congestive) heart failure: Secondary | ICD-10-CM | POA: Diagnosis not present

## 2015-01-01 DIAGNOSIS — I509 Heart failure, unspecified: Secondary | ICD-10-CM

## 2015-01-01 DIAGNOSIS — J9601 Acute respiratory failure with hypoxia: Secondary | ICD-10-CM | POA: Diagnosis not present

## 2015-01-01 LAB — GLUCOSE, CAPILLARY
GLUCOSE-CAPILLARY: 153 mg/dL — AB (ref 70–99)
GLUCOSE-CAPILLARY: 153 mg/dL — AB (ref 70–99)
GLUCOSE-CAPILLARY: 157 mg/dL — AB (ref 70–99)
GLUCOSE-CAPILLARY: 161 mg/dL — AB (ref 70–99)
Glucose-Capillary: 130 mg/dL — ABNORMAL HIGH (ref 70–99)
Glucose-Capillary: 161 mg/dL — ABNORMAL HIGH (ref 70–99)
Glucose-Capillary: 172 mg/dL — ABNORMAL HIGH (ref 70–99)

## 2015-01-01 LAB — BASIC METABOLIC PANEL
Anion gap: 9 (ref 5–15)
Anion gap: 12 (ref 5–15)
BUN: 22 mg/dL (ref 6–23)
BUN: 24 mg/dL — ABNORMAL HIGH (ref 6–23)
CO2: 36 mmol/L — ABNORMAL HIGH (ref 19–32)
CO2: 37 mmol/L — AB (ref 19–32)
CREATININE: 0.75 mg/dL (ref 0.50–1.35)
Calcium: 9.2 mg/dL (ref 8.4–10.5)
Calcium: 9.3 mg/dL (ref 8.4–10.5)
Chloride: 95 mmol/L — ABNORMAL LOW (ref 96–112)
Chloride: 93 mmol/L — ABNORMAL LOW (ref 96–112)
Creatinine, Ser: 0.67 mg/dL (ref 0.50–1.35)
GFR calc Af Amer: >90 mL/min (ref >90)
GFR calc non Af Amer: >90 mL/min (ref >90)
Glucose, Bld: 147 mg/dL — ABNORMAL HIGH (ref 70–99)
Glucose, Bld: 177 mg/dL — ABNORMAL HIGH (ref 70–99)
POTASSIUM: 4.2 mmol/L (ref 3.5–5.1)
POTASSIUM: 4.1 mmol/L (ref 3.5–5.1)
SODIUM: 140 mmol/L (ref 135–145)
SODIUM: 142 mmol/L (ref 135–145)

## 2015-01-01 LAB — CBC
HEMATOCRIT: 51.3 % (ref 39.0–52.0)
Hemoglobin: 15.8 g/dL (ref 13.0–17.0)
MCH: 31.5 pg (ref 26.0–34.0)
MCHC: 30.8 g/dL (ref 30.0–36.0)
MCV: 102.4 fL — ABNORMAL HIGH (ref 78.0–100.0)
Platelets: 181 10*3/uL (ref 150–400)
RBC: 5.01 MIL/uL (ref 4.22–5.81)
RDW: 16.4 % — ABNORMAL HIGH (ref 11.5–15.5)
WBC: 10.5 10*3/uL (ref 4.0–10.5)

## 2015-01-01 LAB — MAGNESIUM: Magnesium: 2 mg/dL (ref 1.5–2.5)

## 2015-01-01 LAB — TRIGLYCERIDES: Triglycerides: 280 mg/dL — ABNORMAL HIGH (ref <150)

## 2015-01-01 MED ORDER — FUROSEMIDE 10 MG/ML IJ SOLN
40.0000 mg | Freq: Two times a day (BID) | INTRAMUSCULAR | Status: AC
  Administered 2015-01-01: 40 mg via INTRAVENOUS
  Filled 2015-01-01: qty 4

## 2015-01-01 MED ORDER — POTASSIUM CHLORIDE 20 MEQ/15ML (10%) PO SOLN
20.0000 meq | Freq: Two times a day (BID) | ORAL | Status: AC
  Administered 2015-01-01 (×2): 20 meq
  Filled 2015-01-01 (×2): qty 15

## 2015-01-01 NOTE — Progress Notes (Addendum)
eLink Physician-Brief Progress Note Patient Name: [name] DOB: 1951/02/16 MRN: [id number]   Date of Service  01/01/2015  HPI/Events of Note  16 beat run of VT. K+ this AM = 4.2. Mg++ = NA. Being actively diuresed today.   eICU Interventions  Will check Mg++ level and BMP now.      Intervention Category Major Interventions: Arrhythmia - evaluation and management  Sommer,Steven Dennard Nipugene 01/01/2015, 5:13 PM

## 2015-01-01 NOTE — Progress Notes (Signed)
  Echocardiogram 2D Echocardiogram has been performed.  [name] 01/01/2015, 11:13 AM

## 2015-01-01 NOTE — Progress Notes (Signed)
NUTRITION FOLLOW-UP  INTERVENTION: -Continue Vital HP @ 10 ml/hr via NGT. -60 ml Prostat QID.    Tube feeding regimen +Miah Propofol infusion provides 1726 kcal (61% of needs), 141 grams of protein, and 201 ml of H2O.   Unable to meet protein needs d/t Wren propofol infusion rate.  RD to continue to monitor  NUTRITION DIAGNOSIS: Inadequate oral intake related to inability to eat as evidenced by NPO status, ongoing.  Goal: Enteral nutrition to provide 65-70% of estimated calorie needs based on ASPEN guidelines for hypocaloric, high protein feeding in critically ill obese individuals  Monitor:  TF regimen & tolerance, respiratory status, weight, labs, I/O's  Admitting Dx: Acute encephalopathy  ASSESSMENT: 64 y/o with h/o HTN who was admitted to step down after difficulty extubating after rotator cuff repair. Has a 44 pack year smoking hx but no formal COPD diagnosis. 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  S/p 4/22 Procedure(s) (LRB): RIGHT SHOULDER ARTHROSCOPY WITH SUBACROMIAL DECOMPRESSION, DISTAL CLAVICLE RESECTION, ROTATOR CUFF REPAIR (Right)  4/25: RN informed RD that pt has had gastric residuals of 600 overnight and residuals are still in the 300s today. TF was held and then restarted at 10 ml/hr. Residuals: 375 ml at 1400.  4/26: Reglan added.  TF residuals 100 mL this morning. Improved from yesterday.  RD will continue to monitor.   Patient is currently intubated on ventilator support MV: 9.2 L/min Temp (24hrs), Avg:99.6 F (37.6 C), Min:99.2 F (37.3 C), Max:100.1 F (37.8 C)  Propofol: 26 ml/hr providing additional 686 kcal  Labs and medications reviewed  Height: Ht Readings from Last 1 Encounters:  12/29/14 5\' 11"  (1.803 m)    Weight: Wt Readings from Last 1 Encounters:  12/28/14 381 lb (172.82 kg)    Ideal Body Weight:  172 lb  % Ideal Body Weight: 221%  Wt Readings from Last 10 Encounters:  12/28/14 381 lb (172.82 kg)  11/29/14 360 lb 3.2 oz (163.386 kg)   BMI:  Body mass index is 53.16 kg/(m^2).  Re-estimated Nutritional Needs: Kcal: 2534             1720- 1955 (22-25 kcal/IBW) Protein: 170-185g Fluid: 1.5L/day  Skin: shoulder incision, scrotum wound  Diet Order: Diet - low sodium heart healthy  EDUCATION NEEDS: -No education needs identified at this time   Intake/Output Summary (Last 24 hours) at 01/01/15 1344 Last data filed at 01/01/15 1200  Gross per 24 hour  Intake 1507.27 ml  Output   4910 ml  Net -3402.73 ml    Last BM: 4/24  Labs:   Recent Labs Lab 12/30/14 0548 12/31/14 0400 01/01/15 0510  NA 137 138 140  K 4.4 4.3 4.2  CL 97 96 95*  CO2 32 36* 36*  BUN 20 21 22   CREATININE 0.91 0.83 0.75  CALCIUM 8.5 8.7 9.2  GLUCOSE 144* 141* 147*    CBG (last 3)   Recent Labs  01/01/15 0404 01/01/15 0753 01/01/15 1204  GLUCAP 153* 161* 161*    Scheduled Meds: . antiseptic oral rinse  7 mL Mouth Rinse QID  . aspirin  325 mg Oral Daily  . budesonide  0.25 mg Nebulization 4 times per day  . chlorhexidine  15 mL Mouth Rinse BID  . famotidine  20 mg Per Tube BID  . feeding supplement (PRO-STAT SUGAR FREE 64)  60 mL Oral QID  . feeding supplement (  VITAL HIGH PROTEIN)  1,000 mL Per Tube Q24H  . folic acid  1 mg Per Tube Daily  . free water  100 mL Per Tube 3 times per day  . furosemide  40 mg Intravenous BID  . heparin subcutaneous  5,000 Units Subcutaneous 3 times per day  . insulin aspart  0-15 Units Subcutaneous 6 times per day  . ipratropium-albuterol  3 mL Nebulization Q6H  . levofloxacin (LEVAQUIN) IV  750 mg Intravenous Q24H  . metoCLOPramide (REGLAN) injection  10 mg Intravenous 4 times per day  . potassium chloride  20 mEq Per Tube BID  . sodium chloride  10-40 mL Intracatheter Q12H    Continuous Infusions: . sodium chloride 10 mL/hr (12/30/14 1016)   . fentaNYL infusion INTRAVENOUS 75 mcg/hr (01/01/15 0900)  . propofol (DIPRIVAN) infusion 25 mcg/kg/min (01/01/15 1134)    Emmaline Kluver MS, RD, LDN [phone]

## 2015-01-01 NOTE — Progress Notes (Signed)
PULMONARY / CRITICAL CARE MEDICINE   Name: [name] MRN: [id number] DOB: 1951-03-20    ADMISSION DATE:  12/28/2014 CONSULTATION DATE:  12/28/14   REFERRING MD :  [name]  PT PROFILE:  66 morbidly obese male admitted 4/22 by Ortho for elective R rotator cuff tear. Intubated early 4/23 AM for post operative hypercarbic/hypoxic resp failure.  STUDIES:  4/26  ECHO >>   MAJOR EVENTS/TEST RESULTS: 4/22  elective R shoulder surgery 4/23  reintubated for hypercarbic/hypoxic resp failure 4/23  US scrotum: Two solid extratesticular right scrotal masses. Primary differential diagnosis for extratesticular masses include primary or metastatic neoplasms including adenomatoid tumor, fibroma, leiomyoma, mesothelioma, or sarcoma. Urology consultation is recommended  4/24  MV support, 8 PEEP, 80%.  Sedate.  4/26  MV support, PEEP 8, 60%  INDWELLING DEVICES:: ETT 4/23 >>  L radial A-line 4/23 >>  PICC (ordered) 4/23 >>   MICRO DATA: PCT 4/23: 0.69, 4/24: 0.44, 4/25: 0.46 Urine 4/23 >> neg Resp 4/23 >>    ANTIMICROBIALS:  Cefazolin 4/22 >> 4/23 Levofloxacin 4/23 >>   SUBJECTIVE:   RN reports pt's TF remained @ 10 ml overnight, residuals ~100-200.  More alert this am, coughing with WUA.  Opens eyes to voice.  Net negative 1500 from lasix 4/25. PEEP 8, fiO2 60%  VITAL SIGNS: Temp:  [99.2 F (37.3 C)-100 F (37.8 C)] 99.4 F (37.4 C) (04/26 0803) Pulse Rate:  [73-87] 83 (04/26 0800) Resp:  [11-17] 15 (04/26 0800) BP: (133-169)/(63-89) 169/89 mmHg (04/26 0800) SpO2:  [90 %-100 %] 94 % (04/26 0800) FiO2 (%):  [60 %-65 %] 60 % (04/26 0800)   HEMODYNAMICS:     VENTILATOR SETTINGS: Vent Mode:  [-] PRVC FiO2 (%):  [60 %-65 %] 60 % Set Rate:  [14 bmp] 14 bmp Vt Set:  [600 mL] 600 mL PEEP:  [8 cmH20] 8 cmH20 Plateau Pressure:  [20 cmH20-26 cmH20] 24 cmH20   INTAKE / OUTPUT:  Intake/Output Summary (Last 24 hours) at 01/01/15 7829 Last data filed at 01/01/15 0600  Gross per 24  hour  Intake 1558.74 ml  Output   3335 ml  Net -1776.26 ml    PHYSICAL EXAMINATION: General: Morbidly obese male on vent in NAD Neuro: Sedate, follows commands on WUA HEENT: NCAT, very short, thick neck Cardiovascular: Reg, no M Lungs: even/non-labored, lungs bilaterally distant but clear Abdomen:  Obese, +BS, soft Ext: R shoulder surgical dressing, no edema, sling in place  LABS:  CBC  Recent Labs Lab 12/30/14 0548 12/31/14 0400 01/01/15 0510  WBC 14.1* 9.5 10.5  HGB 15.4 15.6 15.8  HCT 49.6 48.8 51.3  PLT 158 144* 181   Coag's No results for input(s): APTT, INR in the last 168 hours.   BMET  Recent Labs Lab 12/30/14 0548 12/31/14 0400 01/01/15 0510  NA 137 138 140  K 4.4 4.3 4.2  CL 97 96 95*  CO2 32 36* 36*  BUN CREATININE 0.91 0.83 0.75  GLUCOSE 144* 141* 147*   Electrolytes  Recent Labs Lab 12/30/14 0548 12/31/14 0400 01/01/15 0510  CALCIUM 8.5 8.7 9.2   Sepsis Markers  Recent Labs Lab 12/29/14 1106 12/30/14 0548 12/31/14 0400  PROCALCITON 0.69 0.44 0.46   ABG  Recent Labs Lab 12/28/14 2105 12/28/14 2320 12/29/14 0300  PHART 7.194* 7.158* 7.279*  PCO2ART 77.2* 97.0* 67.1*  PO2ART 76.3* 111.0* 110.0*   Liver Enzymes  Recent Labs Lab 12/30/14 0548  AST 50*  ALT 34  ALKPHOS 69  BILITOT 1.1  ALBUMIN 3.1*   Cardiac Enzymes  Recent Labs Lab 12/28/14 2150 12/29/14 0452 12/29/14 1106  TROPONINI 0.05* 0.18* 0.17*   Glucose  Recent Labs Lab 12/31/14 0341 12/31/14 0724 12/31/14 1134 12/31/14 1548 12/31/14 2357 01/01/15 0753  GLUCAP 158* 133* 152* 153* 157* 161*    CXR: 4/26 - severe cardiomegaly, diffuse interstitial edema, clearing of small L effusion    ASSESSMENT / PLAN:  PULMONARY A:  Acute hypercarbic/hypoxemic Resp Failure RLL ATX - improved Pulmonary Edema - suspect in setting of dCHF Possible LLL PNA vs Effusion  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 Minimal elevation of trop I - doubt ACS Hx HTN Hypertriglyceridemia  P:  Monitor BP and rhythm Assess ECHO PRN hydralazine  ASA 325 QD  RENAL/GU A:   Scrotal mass P:   Monitor BMET intermittently Monitor I/Os Correct electrolytes as indicated Urology consultation after acute critical phase of illness resolves for scrotal mass Free water 100 Q8 Assess EKG in am  GASTROINTESTINAL A:  Obesity Vent Associated Dysphagia - high residuals for TF P:   SUP: enteral famotidine Cont TFs Monitor residuals Reglan IV Q6 Consider KUB if no improvement with reglan to r/o post-op ileus  HEMATOLOGIC A:   Polycythemia - likely chronic hypoxemia Macrocytosis  P:  DVT px: SQ heparin Monitor CBC intermittently Transfuse per usual ICU guidelines Folate  INFECTIOUS A:   Possible LLL PNA  P:  Micro and abx as above  ENDOCRINE A:   Hyperglycemia, controlled   P:   SSI  Q4 CBG  NEUROLOGIC A:  Acute Hypercarbic Encephalopathy Post op pain s/p R rotator cuff surgery ICU associated discomfort P:   RASS goal: -1 Cont propofol and fentanyl gtt Monitor Triglycerides, may need to change to versed PT when able  Defer further post-op care to Ortho  Daily WUA / SBT   GLOBAL:  No other family at bedside am 4/26  Canary BrimBrandi Ollis, NP-C [facility] Pgr: [phone] or [phone] 01/01/2015, 8:22 AM   Attending:  I have seen and examined the patient with nurse practitioner/resident and agree with the note above.   [name] is diuresing well, he has some endotracheal secretions, but not too much.  On exam he has crackles in the bases, no wheezing; R shoulder scar dressing c/d/i  Acute respiratory failure  with hypoxemia> improved oxygenation with diuresis, still requiring full mechanical ventilatory support; continue diuresis today, wean FiO2, PEEP Diastolic heart failure? > favor this as cause of respiratory failure, echo pending, continue lasix R shoulder surgery> per ortho CAP? > doubt, stop levaquin tomorrow  My cc time 35 minutes  Heber CarolinaBrent Yetunde Leis, MD [facility] Pager: [phone] Cell: [phone] If no response, call [phone]

## 2015-01-02 ENCOUNTER — Inpatient Hospital Stay (HOSPITAL_COMMUNITY): Payer: Worker's Compensation

## 2015-01-02 LAB — COMPREHENSIVE METABOLIC PANEL
ALK PHOS: 80 U/L (ref 39–117)
ALT: 29 U/L (ref 0–53)
AST: 45 U/L — ABNORMAL HIGH (ref 0–37)
Albumin: 3.3 g/dL — ABNORMAL LOW (ref 3.5–5.2)
Anion gap: 5 (ref 5–15)
BILIRUBIN TOTAL: 1.9 mg/dL — AB (ref 0.3–1.2)
BUN: 30 mg/dL — ABNORMAL HIGH (ref 6–23)
CO2: 40 mmol/L (ref 19–32)
Calcium: 9 mg/dL (ref 8.4–10.5)
Chloride: 98 mmol/L (ref 96–112)
Creatinine, Ser: 0.78 mg/dL (ref 0.50–1.35)
GFR calc non Af Amer: >90 mL/min (ref >90)
Glucose, Bld: 142 mg/dL — ABNORMAL HIGH (ref 70–99)
POTASSIUM: 4 mmol/L (ref 3.5–5.1)
Sodium: 143 mmol/L (ref 135–145)
TOTAL PROTEIN: 7.2 g/dL (ref 6.0–8.3)

## 2015-01-02 LAB — BLOOD GAS, ARTERIAL
Acid-Base Excess: 13.5 mmol/L — ABNORMAL HIGH (ref 0.0–2.0)
Bicarbonate: 40.1 mEq/L — ABNORMAL HIGH (ref 20.0–24.0)
DRAWN BY: 257701
FIO2: 0.5 %
MECHVT: 600 mL
O2 Saturation: 95.2 %
PCO2 ART: 55.5 mmHg — AB (ref 35.0–45.0)
PEEP/CPAP: 8 cmH2O
PO2 ART: 71.9 mmHg — AB (ref 80.0–100.0)
Patient temperature: 98.6
RATE: 14 resp/min
TCO2: 33.4 mmol/L (ref 0–100)
pH, Arterial: 7.473 — ABNORMAL HIGH (ref 7.350–7.450)

## 2015-01-02 LAB — GLUCOSE, CAPILLARY
GLUCOSE-CAPILLARY: 147 mg/dL — AB (ref 70–99)
GLUCOSE-CAPILLARY: 196 mg/dL — AB (ref 70–99)
GLUCOSE-CAPILLARY: 169 mg/dL — AB (ref 70–99)
Glucose-Capillary: 200 mg/dL — ABNORMAL HIGH (ref 70–99)
Glucose-Capillary: 159 mg/dL — ABNORMAL HIGH (ref 70–99)

## 2015-01-02 LAB — BRAIN NATRIURETIC PEPTIDE: B NATRIURETIC PEPTIDE 5: 55.9 pg/mL (ref 0.0–100.0)

## 2015-01-02 LAB — CBC
HCT: 51.3 % (ref 39.0–52.0)
Hemoglobin: 16.2 g/dL (ref 13.0–17.0)
MCH: 32.3 pg (ref 26.0–34.0)
MCHC: 31.6 g/dL (ref 30.0–36.0)
MCV: 102.2 fL — ABNORMAL HIGH (ref 78.0–100.0)
Platelets: 186 10*3/uL (ref 150–400)
RBC: 5.02 MIL/uL (ref 4.22–5.81)
RDW: 16.3 % — AB (ref 11.5–15.5)
WBC: 11.3 10*3/uL — AB (ref 4.0–10.5)

## 2015-01-02 MED ORDER — METOPROLOL TARTRATE 25 MG/10 ML ORAL SUSPENSION
25.0000 mg | Freq: Two times a day (BID) | ORAL | Status: DC
  Administered 2015-01-02 – 2015-01-05 (×7): 25 mg
  Filled 2015-01-02 (×10): qty 10

## 2015-01-02 MED ORDER — CEFEPIME HCL 2 G IJ SOLR
2.0000 g | Freq: Three times a day (TID) | INTRAMUSCULAR | Status: DC
  Administered 2015-01-02 – 2015-01-06 (×12): 2 g via INTRAVENOUS
  Filled 2015-01-02 (×13): qty 2

## 2015-01-02 MED ORDER — LISINOPRIL 2.5 MG PO TABS
5.0000 mg | ORAL_TABLET | Freq: Every day | ORAL | Status: DC
  Administered 2015-01-02 – 2015-01-05 (×4): 5 mg via ORAL
  Filled 2015-01-02 (×4): qty 2

## 2015-01-02 MED ORDER — VANCOMYCIN HCL 10 G IV SOLR
2500.0000 mg | INTRAVENOUS | Status: AC
  Administered 2015-01-02: 2500 mg via INTRAVENOUS
  Filled 2015-01-02: qty 2500

## 2015-01-02 MED ORDER — POTASSIUM CHLORIDE 20 MEQ/15ML (10%) PO SOLN
20.0000 meq | Freq: Two times a day (BID) | ORAL | Status: AC
  Administered 2015-01-02 (×2): 20 meq
  Filled 2015-01-02 (×2): qty 15

## 2015-01-02 MED ORDER — SODIUM CHLORIDE 0.9 % IV SOLN
1500.0000 mg | Freq: Two times a day (BID) | INTRAVENOUS | Status: DC
  Administered 2015-01-03 – 2015-01-06 (×7): 1500 mg via INTRAVENOUS
  Filled 2015-01-02 (×9): qty 1500

## 2015-01-02 MED ORDER — FUROSEMIDE 10 MG/ML IJ SOLN
40.0000 mg | Freq: Two times a day (BID) | INTRAMUSCULAR | Status: AC
  Administered 2015-01-02 (×2): 40 mg via INTRAVENOUS
  Filled 2015-01-02 (×2): qty 4

## 2015-01-02 NOTE — Progress Notes (Signed)
Patient ID: [name], male   DOB: 08-04-1951, 64 y.o.   MRN: [id number] Seen on rounds. Discussed with nurse and events and status noted. Shoulder wounds benign good pulses in rt wrist. Appreciate everyones help .

## 2015-01-02 NOTE — Care Management Note (Signed)
  Page 2 of 2   01/02/2015     2:02:39 PM CARE MANAGEMENT NOTE 01/02/2015  Patient:  [name],[name]   Account Number:  [id number]  Date Initiated:  12/30/2014  Documentation initiated by:  Pratham Cassatt  Subjective/Objective Assessment:   pt became acute resp failure after rotator cuff repair requiring cpap then bipap and finally requed intubation     Action/Plan:   home when stable   Anticipated DC Date:  01/05/2015   Anticipated DC Plan:  HOME/SELF CARE  In-house referral  NA      DC Planning Services  CM consult      PAC Choice  NA   Choice offered to / List presented to:  NA      DME agency  NA     HH arranged  NA      HH agency  NA   Status of service:  In process, will continue to follow Medicare Important Message given?   (If response is "NO", the following Medicare IM given date fields will be blank) Date Medicare IM given:   Medicare IM given by:   Date Additional Medicare IM given:   Additional Medicare IM given by:    Discharge Disposition:    Per UR Regulation:  Reviewed for med. necessity/level of care/duration of stay  If discussed at Long Length of Stay Meetings, dates discussed:    Comments:  January 02, 2015/Harjot Dibello L. Earlene Plateravis, RN, BSN, CCM. Case Management [facility] [phone] No discharge needs present of time of review. remains on full vent support.  December 30, 2014/Verbena Boeding L. Earlene Plateravis, RN, BSN, CCM. Case Management [facility] [phone] No discharge needs present of time of review.

## 2015-01-02 NOTE — Progress Notes (Signed)
RT collected sputum sample. RT labeled and notified RN at approximately 1653 that sample is ready to be sent to lab (left on PT WOW in room).

## 2015-01-02 NOTE — Progress Notes (Signed)
NUTRITION FOLLOW-UP  INTERVENTION: -Recommend increasing Vital HP @ 10 ml/hr by 10 mL every 6 hours to new goal rate of 40 mL/hr via NGT. -60 ml Prostat QID.    Tube feeding regimen +Reffitt Propofol infusion provides 2169 kcal (14 kcal/kg actual body weight), 196 grams of protein, and 806 ml of H2O.   RD to continue to monitor  NUTRITION DIAGNOSIS: Inadequate oral intake related to inability to eat as evidenced by NPO status, ongoing.  Goal: Enteral nutrition to provide 65-70% of estimated calorie needs based on ASPEN guidelines for hypocaloric, high protein feeding in critically ill obese individuals  Monitor:  TF regimen & tolerance, respiratory status, weight, labs, I/O's  Admitting Dx: Acute encephalopathy  ASSESSMENT: 64 y/o with h/o HTN who was admitted to step down after difficulty extubating after rotator cuff repair. Has a 44 pack year smoking hx but no formal COPD diagnosis. 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  S/p 4/22 Procedure(s) (LRB): RIGHT SHOULDER ARTHROSCOPY WITH SUBACROMIAL DECOMPRESSION, DISTAL CLAVICLE RESECTION, ROTATOR CUFF REPAIR (Right)  4/25: RN informed RD that pt has had gastric residuals of 600 overnight and residuals are still in the 300s today. TF was held and then restarted at 10 ml/hr. Residuals: 375 ml at 1400.  4/26: Reglan added.  TF residuals 100 mL this morning. Improved from yesterday.   4/27: Updated weight obtained by nursing.  Pt in negative fluid balance (-5.3 L since admission.) Propofol rate decreased Recommend adjusting TF rate to better fit nutritional needs.  Patient is currently intubated on ventilator support MV: 13.4 L/min Temp (24hrs), Avg:100 F (37.8 C), Min:99.5 F (37.5 C), Max:100.9 F (38.3 C)  Propofol: 15.5 ml/hr providing additional 409 kcal  Labs and medications reviewed  CBGs  147-196   Height: Ht Readings from Last 1 Encounters:  12/29/14  (1.803 m)    Weight: Wt Readings from Last 1 Encounters:  01/02/15 339 lb (153.769 kg)    Ideal Body Weight: 172 lb  % Ideal Body Weight: 221%  Wt Readings from Last 10 Encounters:  01/02/15 339 lb (153.769 kg)  11/29/14 360 lb 3.2 oz (163.386 kg)   BMI:  Body mass index is 47.3 kg/(m^2).  Re-estimated Nutritional Needs: Kcal: 2709             1692- 2153 (11-14 kcal/kg actual body weight) Protein: >196 g Fluid: Per MD  Skin: shoulder incision, scrotum wound  Diet Order: Diet - low sodium heart healthy  EDUCATION NEEDS: -No education needs identified at this time   Intake/Output Summary (Last 24 hours) at 01/02/15 1325 Last data filed at 01/02/15 1100  Gross per 24 hour  Intake 1497.6 ml  Output   3410 ml  Net -1912.4 ml    Last BM: 4/27  Labs:   Recent Labs Lab 01/01/15 0510 01/01/15 1809 01/02/15 0430  NA 140 142 143  K 4.2 4.1 4.0  CL 95* 93* 98  CO2 36* 37* 40*  BUN 22 24* 30*  CREATININE 0.75 0.67 0.78  CALCIUM 9.2 9.3 9.0  MG  --  2.0  --   GLUCOSE 147* 177* 142*    CBG (last 3)   Recent Labs  01/02/15 0327 01/02/15 0750 01/02/15 1143  GLUCAP 147* 159* 196*    Scheduled Meds: . antiseptic oral rinse  7 mL Mouth Rinse QID  . aspirin  325 mg Oral Daily  .  budesonide  0.25 mg Nebulization 4 times per day  . chlorhexidine  15 mL Mouth Rinse BID  . famotidine  20 mg Per Tube BID  . feeding supplement (PRO-STAT SUGAR FREE 64)  60 mL Oral QID  . feeding supplement (VITAL HIGH PROTEIN)  1,000 mL Per Tube Q24H  . folic acid  1 mg Per Tube Daily  . free water  100 mL Per Tube 3 times per day  . furosemide  40 mg Intravenous Q12H  . heparin subcutaneous  5,000 Units Subcutaneous 3 times per day  . insulin aspart  0-15 Units Subcutaneous 6 times per day  . ipratropium-albuterol  3 mL Nebulization Q6H  . lisinopril  5 mg Oral Daily  . metoprolol tartrate  25 mg Per  Tube BID  . potassium chloride  20 mEq Per Tube BID  . sodium chloride  10-40 mL Intracatheter Q12H    Continuous Infusions: . sodium chloride 10 mL/hr (12/30/14 1016)  . fentaNYL infusion INTRAVENOUS 50 mcg/hr (01/02/15 0809)  . propofol (DIPRIVAN) infusion 25 mcg/kg/min (01/02/15 1143)    Emmaline KluverHaley Karrisa Didio MS, RD, LDN [phone]

## 2015-01-02 NOTE — Progress Notes (Signed)
eLink Physician-Brief Progress Note Patient Name: Fayrene FearingJames Rago DOB: 1951/07/05 MRN: [id number]   Date of Service  01/02/2015  HPI/Events of Note  Fever reported 102F  eICU Interventions  Resp, blood and urine cx drawn Ensure f/u CXR am Empiric abx if clinically changes     Intervention Category Intermediate Interventions: Infection - evaluation and management  Deziree Mokry S. 01/02/2015, 4:44 PM

## 2015-01-02 NOTE — Progress Notes (Signed)
[facility] PCCM  Fever, continued hypoxemia Start HCAP coverage  Heber CarolinaBrent McQuaid, MD  PCCM Pager: [phone] Cell: [phone] If no response, call [phone]

## 2015-01-02 NOTE — Progress Notes (Signed)
PULMONARY / CRITICAL CARE MEDICINE   Name: [name] MRN: [id number] DOB: 1950-10-08    ADMISSION DATE:  12/28/2014 CONSULTATION DATE:  12/28/14   REFERRING MD :  Imogene BurnJenkins/TRH  PT PROFILE:  89 morbidly obese male admitted 4/22 by Ortho for elective R rotator cuff tear. Intubated early 4/23 AM for post operative hypercarbic/hypoxic resp failure.  STUDIES:  4/26  ECHO >> nml LV, EF 60-65%, Grade 1 DD, trivial pericardial effusion without evidence of tamponade  MAJOR EVENTS/TEST RESULTS: 4/22  elective R shoulder surgery 4/23  reintubated for hypercarbic/hypoxic resp failure 4/23  US scrotum: Two solid extratesticular right scrotal masses. Primary differential diagnosis for extratesticular masses include primary or metastatic neoplasms including adenomatoid tumor, fibroma, leiomyoma, mesothelioma, or sarcoma. Urology consultation is recommended  4/24  MV support, 8 PEEP, 80%.  Sedate.  4/26  MV support, PEEP 8, 60% 4/27  PEEP 8, 50%.  Tolerating TF, residuals improved. Net neg 2L  INDWELLING DEVICES:: ETT 4/23 >>  L radial A-line 4/23 >> 4/25 PICC LUE 4/23 >>   MICRO DATA: PCT 4/23: 0.69, 4/24: 0.44, 4/25: 0.46 Urine 4/23 >> neg Resp 4/23 >> neg    ANTIMICROBIALS:  Cefazolin 4/22 >> 4/23 Levofloxacin 4/23 >> 4/27  SUBJECTIVE:   PEEP 8, 50%.  Tolerating TF.  Runs of VT overnight x 2.    VITAL SIGNS: Temp:  [99.5 F (37.5 C)-100.9 F (38.3 C)] 100.9 F (38.3 C) (04/27 0800) Pulse Rate:  [82-100] 89 (04/27 0900) Resp:  [13-26] 18 (04/27 0900) BP: (115-179)/(61-93) 134/62 mmHg (04/27 0900) SpO2:  [91 %-97 %] 91 % (04/27 0900) FiO2 (%):  [50 %-55 %] 50 % (04/27 0900) Weight:  [339 lb (153.769 kg)] 339 lb (153.769 kg) (04/27 0400)   HEMODYNAMICS:     VENTILATOR SETTINGS: Vent Mode:  [-] PRVC FiO2 (%):  [50 %-55 %] 50 % Set Rate:  [14 bmp] 14 bmp Vt Set:  [600 mL] 600 mL PEEP:  [5 cmH20-8 cmH20] 8 cmH20 Plateau Pressure:  [17 cmH20-24 cmH20] 20 cmH20   INTAKE /  OUTPUT:  Intake/Output Summary (Last 24 hours) at 01/02/15 0932 Last data filed at 01/02/15 0900  Gross per 24 hour  Intake   1790 ml  Output   3110 ml  Net  -1320 ml    PHYSICAL EXAMINATION: General: Morbidly obese male on vent in NAD Neuro: Sedate, follows commands on WUA HEENT: NCAT, very short, thick neck Cardiovascular: Reg, no M Lungs: even/non-labored, lungs bilaterally distant but clear Abdomen:  Obese, +BS, soft Ext: R shoulder surgical dressing, no edema, sling in place  LABS:  CBC  Recent Labs Lab 12/31/14 0400 01/01/15 0510 01/02/15 0430  WBC 9.5 10.5 11.3*  HGB 15.6 15.8 16.2  HCT 48.8 51.3 51.3  PLT 144* 181 186   Coag's No results for input(s): APTT, INR in the last 168 hours.   BMET  Recent Labs Lab 01/01/15 0510 01/01/15 1809 01/02/15 0430  NA 140 142 143  K 4.2 4.1 4.0  CL 95* 93* 98  CO2 36* 37* 40*  BUN 22 24* 30*  CREATININE 0.75 0.67 0.78  GLUCOSE 147* 177* 142*   Electrolytes  Recent Labs Lab 01/01/15 0510 01/01/15 1809 01/02/15 0430  CALCIUM 9.2 9.3 9.0  MG  --  2.0  --    Sepsis Markers  Recent Labs Lab 12/29/14 1106 12/30/14 0548 12/31/14 0400  PROCALCITON 0.69 0.44 0.46   ABG  Recent Labs Lab 12/28/14 2105 12/28/14 2320 12/29/14 0300  PHART  7.194* 7.158* 7.279*  PCO2ART 77.2* 97.0* 67.1*  PO2ART 76.3* 111.0* 110.0*   Liver Enzymes  Recent Labs Lab 12/30/14 0548 01/02/15 0430  AST 50* 45*  ALT 34 29  ALKPHOS 69 80  BILITOT 1.1 1.9*  ALBUMIN 3.1* 3.3*   Cardiac Enzymes  Recent Labs Lab 12/28/14 2150 12/29/14 0452 12/29/14 1106  TROPONINI 0.05* 0.18* 0.17*   Glucose  Recent Labs Lab 01/01/15 1204 01/01/15 1657 01/01/15 2019 01/01/15 2326 01/02/15 0327 01/02/15 0750  GLUCAP 161* 172* 153* 200* 147* 159*    CXR: 4/27 - cardiomegaly, lines in good position, CHF with interstitial edema     ASSESSMENT / PLAN:  PULMONARY A:  Acute Hypercarbic / Hypoxemic Resp Failure RLL ATX -  improved Pulmonary Edema - suspect in setting of dCHF Low suspicion for PNA - likely edema 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 (Hgb 18, serum CO2 32) Likely OHS/OSA Anticipate difficult airway due to body habitus P:   Cont MV support, 8 cc/kg Wean PEEP/FiO2 for sats > 90% Daily SBT / WUA  Repeat Lasix 40 mg Q12 x2 doses with KCL  Trend CXR  Pulmicort Q6  CARDIOVASCULAR A:  Hypotension - resolved. Minimal elevation of trop I - doubt ACS Hx HTN Hypertriglyceridemia  Diastolic Dysfunction - grade 1 on ECHO 4/26 VT - episode x 2 4/26 P:  Monitor BP and rhythm PRN hydralazine  ASA 325 QD Monitor rhythm   RENAL/GU A:   Scrotal mass P:   Monitor BMET intermittently Monitor I/Os Correct electrolytes as indicated Urology consultation after acute critical phase of illness resolves for scrotal mass Free water 100 Q8  GASTROINTESTINAL A:  Obesity Vent Associated Dysphagia - high residuals for TF P:   SUP: enteral famotidine Cont TFs Monitor residuals D/C further reglan with VT episodes  HEMATOLOGIC A:   Polycythemia - likely chronic hypoxemia Macrocytosis  P:  DVT px: SQ heparin Monitor CBC intermittently Transfuse per usual ICU guidelines Folate  INFECTIOUS A:   Possible LLL PNA - ruled out, likely edema.   Low grade temp 4/27 P:  Micro and abx as above Monitor fever curve, culture if temp > 101.5  ENDOCRINE A:   Hyperglycemia, controlled   P:   SSI  Q4 CBG  NEUROLOGIC A:  Acute Hypercarbic Encephalopathy Post op pain s/p R rotator cuff surgery ICU associated discomfort P:   RASS goal: -1 Cont propofol and fentanyl gtt Monitor Triglycerides, may need to change to versed PT when able  Defer further post-op care to Ortho  Daily WUA / SBT   GLOBAL:  No other family at bedside am 4/26  Canary Brim, NP-C [facility] Pgr: [phone] or [phone] 01/02/2015, 9:32 AM   Attending:  I have seen and  examined the patient with nurse practitioner/resident and agree with the note above.   No improvement in oxygenation per SaO2.  Diuresing, new low grade temp, minimal secretions per tube  Lungs: crackles bases Neuro: sedated on vent MSK: R shoulder dressing in place, R arm warm  CXR reveiwed> bibasilar airspace disease  Acute respiratory failure with hypoxemia> no improvement I wonder if SaO2 underestimating oxygenation; check ABG now, presumably all due to pulm edema based on exam, will CT chest tomorrow if no improvement Pulmonary edema> due to diastolic heart failure exacerbation, echo reviewed; continue lasix, add b-blocker and afterload reduction (metoprolol and lisinopril) Low grade temp> check cultures if Temp > 101.5  My cc time 35 minutes  Heber Voltaire, MD [place]  PCCM Pager: [phone] Cell: [phone] If no response, call [phone]

## 2015-01-02 NOTE — Progress Notes (Signed)
ANTIBIOTIC CONSULT NOTE - INITIAL  Pharmacy Consult for vancomycin and cefepime Indication: HCAP  No Known Allergies  Patient Measurements: Height: 5\' 11"  (180.3 cm) Weight: (!) 339 lb (153.769 kg) IBW/kg (Calculated) : 75.3   Vital Signs: Temp: 102 F (38.9 C) (04/27 1600) Temp Source: Oral (04/27 1600) BP: 143/69 mmHg (04/27 1600) Pulse Rate: 100 (04/27 1600) Intake/Output from previous day: 04/26 0701 - 04/27 0700 In: 1868 [I.V.:1028; NG/GT:690; IV Piggyback:150] Out: 4035 [Urine:4035] Intake/Output from this shift: Total I/O In: 636 [I.V.:316; NG/GT:320] Out: 1765 [Urine:1765]  Labs:  Recent Labs  12/31/14 0400 01/01/15 0510 01/01/15 1809 01/02/15 0430  WBC 9.5 10.5  --  11.3*  HGB 15.6 15.8  --  16.2  PLT 144* 181  --  186  CREATININE 0.83 0.75 0.67 0.78   Estimated Creatinine Clearance: 146.3 mL/min (by C-G formula based on Cr of 0.78). No results for input(s): VANCOTROUGH, VANCOPEAK, VANCORANDOM, GENTTROUGH, GENTPEAK, GENTRANDOM, TOBRATROUGH, TOBRAPEAK, TOBRARND, AMIKACINPEAK, AMIKACINTROU, AMIKACIN in the last 72 hours.   Microbiology: Recent Results (from the past 720 hour(s))  MRSA PCR Screening     Status: None   Collection Time: 12/28/14 10:18 PM  Result Value Ref Range Status   MRSA by PCR NEGATIVE NEGATIVE Final    Comment:        The GeneXpert MRSA Assay (FDA approved for NASAL specimens only), is one component of a comprehensive MRSA colonization surveillance program. It is not intended to diagnose MRSA infection nor to guide or monitor treatment for MRSA infections.   Culture, Urine     Status: None   Collection Time: 12/29/14 11:34 AM  Result Value Ref Range Status   Specimen Description URINE, CATHETERIZED  Final   Special Requests NONE  Final   Colony Count NO GROWTH Performed at [facility]   Final   Culture NO GROWTH Performed at [facility]   Final   Report Status 12/31/2014 FINAL  Final  Culture,  respiratory (NON-Expectorated)     Status: None   Collection Time: 12/29/14  1:28 PM  Result Value Ref Range Status   Specimen Description TRACHEAL ASPIRATE  Final   Special Requests NONE  Final   Gram Stain   Final    FEW WBC PRESENT,BOTH PMN AND MONONUCLEAR RARE SQUAMOUS EPITHELIAL CELLS PRESENT NO ORGANISMS SEEN Performed at [facility]    Culture   Final    NO GROWTH 2 DAYS Performed at [facility]    Report Status 12/31/2014 FINAL  Final    Medical History: Past Medical History  Diagnosis Date  . Arthritis   . RCT (rotator cuff tear)   . Multiple abrasions     rt arm  . Morbid obesity     Medications:  See med rec   Assessment: Patient's a 64 y.o. morbidly obese male admitted 4/22 by Ortho for elective R rotator cuff tear. He was subsequently Intubated early 4/23 AM for post operative hypercarbic/hypoxic resp failure.  He remains febrile with Tmax 102, wbc elevated at 11.3, and hypoxic.  To start broad abx with vancomycin and cefepime for suspected HCAP.  4/23 Levaquin >> 4/26  4/27 vanc>> 4/27 cefepime>>  4/27 TA: pending 4/27 bcx x2: pending 4/27 ucx: pending 4/23 TA: neg FINAL 4/23 ucx: neg FINAL   Goal of Therapy:  Vancomycin trough level 15-20 mcg/ml  Plan:  - cefepime 2gm IV q8h - vancomycin 2500 mg IV x1 load, then 1500 mg IV q12h - will check vancomycin trough  level at steady state  Issabela Lesko P 01/02/2015,5:36 PM

## 2015-01-03 ENCOUNTER — Inpatient Hospital Stay (HOSPITAL_COMMUNITY): Payer: Worker's Compensation

## 2015-01-03 LAB — CBC
HCT: 52 % (ref 39.0–52.0)
Hemoglobin: 16 g/dL (ref 13.0–17.0)
MCH: 31.5 pg (ref 26.0–34.0)
MCHC: 30.8 g/dL (ref 30.0–36.0)
MCV: 102.4 fL — ABNORMAL HIGH (ref 78.0–100.0)
PLATELETS: 199 10*3/uL (ref 150–400)
RBC: 5.08 MIL/uL (ref 4.22–5.81)
RDW: 16.3 % — ABNORMAL HIGH (ref 11.5–15.5)
WBC: 11.1 10*3/uL — ABNORMAL HIGH (ref 4.0–10.5)

## 2015-01-03 LAB — GLUCOSE, CAPILLARY
GLUCOSE-CAPILLARY: 168 mg/dL — AB (ref 70–99)
Glucose-Capillary: 169 mg/dL — ABNORMAL HIGH (ref 70–99)
Glucose-Capillary: 192 mg/dL — ABNORMAL HIGH (ref 70–99)
Glucose-Capillary: 174 mg/dL — ABNORMAL HIGH (ref 70–99)
Glucose-Capillary: 203 mg/dL — ABNORMAL HIGH (ref 70–99)
Glucose-Capillary: 180 mg/dL — ABNORMAL HIGH (ref 70–99)
Glucose-Capillary: 192 mg/dL — ABNORMAL HIGH (ref 70–99)

## 2015-01-03 MED ORDER — VITAL HIGH PROTEIN PO LIQD
1000.0000 mL | ORAL | Status: DC
  Administered 2015-01-03 – 2015-01-11 (×9): 1000 mL
  Filled 2015-01-03 (×11): qty 1000

## 2015-01-03 MED ORDER — FENTANYL CITRATE (PF) 100 MCG/2ML IJ SOLN
50.0000 ug | INTRAMUSCULAR | Status: DC | PRN
  Administered 2015-01-03 – 2015-01-06 (×4): 50 ug via INTRAVENOUS
  Filled 2015-01-03 (×5): qty 2

## 2015-01-03 MED ORDER — VITAMINS A & D EX OINT
TOPICAL_OINTMENT | CUTANEOUS | Status: AC
  Administered 2015-01-03: 1
  Filled 2015-01-03: qty 5

## 2015-01-03 NOTE — Progress Notes (Signed)
PULMONARY / CRITICAL CARE MEDICINE   Name: [name] MRN: [id number] DOB: 04/09/1951    ADMISSION DATE:  12/28/2014 CONSULTATION DATE:  12/28/14   REFERRING MD :  [name]  PT PROFILE:  58 morbidly obese male admitted 4/22 by Ortho for elective R rotator cuff tear. Intubated early 4/23 AM for post operative hypercarbic/hypoxic resp failure.  STUDIES:  4/26  ECHO >> nml LV, EF 60-65%, Grade 1 DD, trivial pericardial effusion without evidence of tamponade 4/27 [place] testes with masses   MAJOR EVENTS/TEST RESULTS: 4/22  elective R shoulder surgery 4/23  reintubated for hypercarbic/hypoxic resp failure 4/23  US scrotum: Two solid extratesticular right scrotal masses. Primary differential diagnosis for extratesticular masses include primary or metastatic neoplasms including adenomatoid tumor, fibroma, leiomyoma, mesothelioma, or sarcoma. Urology consultation is recommended  4/24  MV support, 8 PEEP, 80%.  Sedate.  4/26  MV support, PEEP 8, 60% 4/27  PEEP 8, 50%.  Tolerating TF, residuals improved. Net neg 2L  INDWELLING DEVICES:: ETT 4/23 >>  L radial A-line 4/23 >> 4/25 PICC LUE 4/23 >>   MICRO DATA: PCT 4/23: 0.69, 4/24: 0.44, 4/25: 0.46 Urine 4/23 >> neg Resp 4/23 >> neg  4/27 bc>> 4/27 uc>> 4/27 sputum>>    ANTIMICROBIALS:  Cefazolin 4/22 >> 4/23 Levofloxacin 4/23 >> 4/27 4/27 vanc>> 4/27 cefepime>>   SUBJECTIVE:   Comfortable on vent  VITAL SIGNS: Temp:  [100.7 F (38.2 C)-103.1 F (39.5 C)] 102.4 F (39.1 C) (04/28 0800) Pulse Rate:  [85-106] 95 (04/28 0900) Resp:  [17-21] 19 (04/28 0900) BP: (108-183)/(55-92) 182/84 mmHg (04/28 0900) SpO2:  [89 %-93 %] 90 % (04/28 0900) FiO2 (%):  [50 %-60 %] 60 % (04/28 0820) Weight:  [334 lb (151.501 kg)] 334 lb (151.501 kg) (04/28 0300)   HEMODYNAMICS:     VENTILATOR SETTINGS: Vent Mode:  [-] PRVC FiO2 (%):  [50 %-60 %] 60 % Set Rate:  [14 bmp] 14 bmp Vt Set:  [600 mL] 600 mL PEEP:  [8 cmH20] 8  cmH20 Plateau Pressure:  [17 cmH20-24 cmH20] 24 cmH20   INTAKE / OUTPUT:  Intake/Output Summary (Last 24 hours) at 01/03/15 0941 Last data filed at 01/03/15 0865  Gross per 24 hour  Intake 1527.73 ml  Output   3585 ml  Net -2057.27 ml    PHYSICAL EXAMINATION: General: Morbidly obese male on vent in NAD Neuro: Sedate, follows commands on WUA HEENT: NCAT, very short, thick neck Cardiovascular: Reg, no M Lungs: even/non-labored, lungs bilaterally distant but clear Abdomen:  Obese, +BS, soft Ext: R shoulder surgical dressing, no edema, sling in place  LABS:  CBC  Recent Labs Lab 01/01/15 0510 01/02/15 0430 01/03/15 0530  WBC 10.5 11.3* 11.1*  HGB 15.8 16.2 16.0  HCT 51.3 51.3 52.0  PLT 181 186 199   Coag's No results for input(s): APTT, INR in the last 168 hours.   BMET  Recent Labs Lab 01/01/15 0510 01/01/15 1809 01/02/15 0430  NA 140 142 143  K 4.2 4.1 4.0  CL 95* 93* 98  CO2 36* 37* 40*  BUN 22 24* 30*  CREATININE 0.75 0.67 0.78  GLUCOSE 147* 177* 142*   Electrolytes  Recent Labs Lab 01/01/15 0510 01/01/15 1809 01/02/15 0430  CALCIUM 9.2 9.3 9.0  MG  --  2.0  --    Sepsis Markers  Recent Labs Lab 12/29/14 1106 12/30/14 0548 12/31/14 0400  PROCALCITON 0.69 0.44 0.46   ABG  Recent Labs Lab 12/28/14 2320 12/29/14 0300 01/02/15 1300  PHART 7.158* 7.279* 7.473*  PCO2ART 97.0* 67.1* 55.5*  PO2ART 111.0* 110.0* 71.9*   Liver Enzymes  Recent Labs Lab 12/30/14 0548 01/02/15 0430  AST 50* 45*  ALT 34 29  ALKPHOS 69 80  BILITOT 1.1 1.9*  ALBUMIN 3.1* 3.3*   Cardiac Enzymes  Recent Labs Lab 12/28/14 2150 12/29/14 0452 12/29/14 1106  TROPONINI 0.05* 0.18* 0.17*   Glucose  Recent Labs Lab 01/02/15 1143 01/02/15 1559 01/02/15 2038 01/02/15 2317 01/03/15 0343 01/03/15 0752  GLUCAP 196* 169* 169* 192* 168* 174*    CXR: 4/27 - cardiomegaly, lines in good position, CHF with interstitial edema     ASSESSMENT /  PLAN:  PULMONARY A:  Acute Hypercarbic / Hypoxemic Resp Failure RLL ATX - improved Pulmonary Edema - suspect in setting of dCHF 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 (Hgb 18, serum CO2 32) Likely OHS/OSA Anticipate difficult airway due to body habitus P:   Cont MV support, 8 cc/kg Wean PEEP/FiO2 for sats > 90% Daily SBT / WUA  Repeat Lasix 40 mg Q12 x2 doses with KCL  Trend CXR  Pulmicort Q6 He may need trach to come off vent due to body habitus  CARDIOVASCULAR A:  Hypotension - resolved. Minimal elevation of trop I - doubt ACS Hx HTN Hypertriglyceridemia  Diastolic Dysfunction - grade 1 on ECHO 4/26 VT - episode x 2 4/26 P:  Monitor BP and rhythm PRN hydralazine  ASA 325 QD Monitor rhythm   RENAL/GU A:   Scrotal mass P:   Monitor BMET intermittently Monitor I/Os Correct electrolytes as indicated Urology consultation after acute critical phase of illness resolves for scrotal mass Free water 100 Q8  GASTROINTESTINAL A:  Obesity Vent Associated Dysphagia - high residuals for TF P:   SUP: enteral famotidine Cont TFs Monitor residuals D/C further reglan with VT episodes  HEMATOLOGIC A:   Polycythemia - likely chronic hypoxemia Macrocytosis  P:  DVT px: SQ heparin Monitor CBC intermittently Transfuse per usual ICU guidelines Folate  INFECTIOUS A:   Possible LLL PNA - ruled out, likely edema.   increased temp 4/27 Hcap coverage started 4/27 P:  4/27 bc>> 4/27 uc>> 4/27 sputum>> 4/27 vanc>> 4/27 cefepime>>  ENDOCRINE A:   Hyperglycemia, controlled   P:   SSI  Q4 CBG  NEUROLOGIC A:  Acute Hypercarbic Encephalopathy Post op pain s/p R rotator cuff surgery ICU associated discomfort P:   RASS goal: -1 Cont propofol and fentanyl gtt Monitor Triglycerides, may need to change to versed PT when able  Defer further post-op care to Ortho  Daily WUA / SBT   GLOBAL:  No other family at bedside am 4/26    Brett CanalesSteve Minor  ACNP [facility] Pager [phone] till 3 pm If no answer page [phone] 01/03/2015, 9:42 AM

## 2015-01-03 NOTE — Progress Notes (Signed)
PT passed SBT and weaned 5.5 total hours on 5/8 at 60% (MD McQuaid aware that PT was weaned on documented Fi02 / PEEP). RN aware.

## 2015-01-03 NOTE — Progress Notes (Signed)
Propofol drip stopped up at 0834 and [facility].  Patient has not yet followed commands or demonstrated purposeful movement.  Patient has moved very little.  Notified Dr. Craige CottaSood, elink, and instructed to use fentanyl pushes, rather than drips at this time.  Will continue to monitor.

## 2015-01-03 NOTE — Progress Notes (Signed)
NUTRITION FOLLOW-UP  INTERVENTION: -Increase Vital HP @ 10 ml/hr by 10 mL every 8 hours to new goal rate of 40 mL/hr via OGT. -60 ml Prostat QID.    Tube feeding regimen +Propofol infusion provides 2090 kcal (13.8 kcal/kg actual body weight), 196 grams of protein, and 806 ml of H2O.   RD to continue to monitor  NUTRITION DIAGNOSIS: Inadequate oral intake related to inability to eat as evidenced by NPO status, ongoing.  Goal: Enteral nutrition to provide 65-70% of estimated calorie needs based on ASPEN guidelines for hypocaloric, high protein feeding in critically ill obese individuals  Monitor:  TF regimen & tolerance, respiratory status, weight, labs, I/O's  Admitting Dx: Acute encephalopathy  ASSESSMENT: 64 y/o with h/o HTN who was admitted to step down after difficulty extubating after rotator cuff repair. Has a 44 pack year smoking hx but no formal COPD diagnosis. 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  S/p 4/22 Procedure(s) (LRB): RIGHT SHOULDER ARTHROSCOPY WITH SUBACROMIAL DECOMPRESSION, DISTAL CLAVICLE RESECTION, ROTATOR CUFF REPAIR (Right)  4/25: RN informed RD that pt has had gastric residuals of 600 overnight and residuals are still in the 300s today. TF was held and then restarted at 10 ml/hr. Residuals: 375 ml at 1400.  4/26: Reglan added.  TF residuals 100 mL this morning. Improved from yesterday.   4/27: Updated weight obtained by nursing.  Pt in negative fluid balance (-5.3 L since admission.) Propofol rate decreased  4/28: Pt tolerating TF with minimal residuals.  Wt continues to decreased. Pt now -6.0 L since admission. Per MD, ok to advance TF slowly to better fit nutritional needs.   Patient is currently intubated on ventilator support MV: 12.3 L/min Temp (24hrs), Avg:102.4 F (39.1 C), Min:102 F (38.9 C), Max:103.1 F (39.5  C)  Propofol: per RN, will be re-started @ 12.5 mL/hr to provide additional 330 kcal  Labs and medications reviewed   Height: Ht Readings from Last 1 Encounters:  12/29/14 5\' 11"  (1.803 m)    Weight: Wt Readings from Last 1 Encounters:  01/03/15 334 lb (151.501 kg)    Ideal Body Weight: 172 lb  % Ideal Body Weight: 194%  Wt Readings from Last 10 Encounters:  01/03/15 334 lb (151.501 kg)  11/29/14 360 lb 3.2 oz (163.386 kg)   BMI:  Body mass index is 46.6 kg/(m^2).  Re-estimated Nutritional Needs: Kcal: 2725             1667- 2121 (11-14 kcal/kg actual body weight) Protein: >196 g Fluid: Per MD  Skin: shoulder incision, scrotum wound  Diet Order: Diet - low sodium heart healthy  EDUCATION NEEDS: -No education needs identified at this time   Intake/Output Summary (Last 24 hours) at 01/03/15 1203 Last data filed at 01/03/15 1102  Gross per 24 hour  Intake 2169.91 ml  Output   3135 ml  Net -965.09 ml    Last BM: 4/27  Labs:   Recent Labs Lab 01/01/15 0510 01/01/15 1809 01/02/15 0430  NA 140 142 143  K 4.2 4.1 4.0  CL 95* 93* 98  CO2 36* 37* 40*  BUN 22 24* 30*  CREATININE 0.75 0.67 0.78  CALCIUM 9.2 9.3 9.0  MG  --  2.0  --   GLUCOSE 147* 177* 142*    CBG (last 3)   Recent Labs  01/02/15 2317 01/03/15 0343 01/03/15 0752  GLUCAP 192*  168* 174*    Scheduled Meds: . antiseptic oral rinse  7 mL Mouth Rinse QID  . aspirin  325 mg Oral Daily  . budesonide  0.25 mg Nebulization 4 times per day  . ceFEPime (MAXIPIME) IV  2 g Intravenous Q8H  . chlorhexidine  15 mL Mouth Rinse BID  . famotidine  20 mg Per Tube BID  . feeding supplement (PRO-STAT SUGAR FREE 64)  60 mL Oral QID  . feeding supplement (VITAL HIGH PROTEIN)  1,000 mL Per Tube Q24H  . folic acid  1 mg Per Tube Daily  . free water  100 mL Per Tube 3 times per day  . heparin subcutaneous  5,000 Units Subcutaneous 3 times per day  . insulin aspart  0-15 Units Subcutaneous 6 times  per day  . ipratropium-albuterol  3 mL Nebulization Q6H  . lisinopril  5 mg Oral Daily  . metoprolol tartrate  25 mg Per Tube BID  . sodium chloride  10-40 mL Intracatheter Q12H  . vancomycin  1,500 mg Intravenous Q12H  . vitamin A & D        Continuous Infusions: . sodium chloride 10 mL/hr at 01/03/15 0223  . fentaNYL infusion INTRAVENOUS Stopped (01/03/15 1100)  . propofol (DIPRIVAN) infusion Stopped (01/03/15 0834)    Emmaline Kluver MS, RD, LDN [phone]

## 2015-01-04 ENCOUNTER — Inpatient Hospital Stay (HOSPITAL_COMMUNITY): Payer: Worker's Compensation

## 2015-01-04 LAB — CBC
HEMATOCRIT: 54.2 % — AB (ref 39.0–52.0)
Hemoglobin: 16.7 g/dL (ref 13.0–17.0)
MCH: 31.8 pg (ref 26.0–34.0)
MCHC: 30.8 g/dL (ref 30.0–36.0)
MCV: 103.2 fL — AB (ref 78.0–100.0)
Platelets: 185 10*3/uL (ref 150–400)
RBC: 5.25 MIL/uL (ref 4.22–5.81)
RDW: 16.7 % — AB (ref 11.5–15.5)
WBC: 12.2 10*3/uL — ABNORMAL HIGH (ref 4.0–10.5)

## 2015-01-04 LAB — BLOOD GAS, ARTERIAL
Acid-Base Excess: 10.4 mmol/L — ABNORMAL HIGH (ref 0.0–2.0)
BICARBONATE: 37 meq/L — AB (ref 20.0–24.0)
Drawn by: 31814
FIO2: 0.65 %
LHR: 14 {breaths}/min
MECHVT: 600 mL
O2 SAT: 95 %
PATIENT TEMPERATURE: 103.1
PEEP: 8 cmH2O
PO2 ART: 87.3 mmHg (ref 80.0–100.0)
TCO2: 31 mmol/L (ref 0–100)
pCO2 arterial: 62.6 mmHg (ref 35.0–45.0)
pH, Arterial: 7.402 (ref 7.350–7.450)

## 2015-01-04 LAB — GLUCOSE, CAPILLARY
GLUCOSE-CAPILLARY: 188 mg/dL — AB (ref 70–99)
Glucose-Capillary: 205 mg/dL — ABNORMAL HIGH (ref 70–99)
Glucose-Capillary: 200 mg/dL — ABNORMAL HIGH (ref 70–99)
Glucose-Capillary: 225 mg/dL — ABNORMAL HIGH (ref 70–99)

## 2015-01-04 LAB — URINE CULTURE
Colony Count: NO GROWTH
Culture: NO GROWTH

## 2015-01-04 LAB — PHOSPHORUS: Phosphorus: 2.6 mg/dL (ref 2.3–4.6)

## 2015-01-04 LAB — MAGNESIUM: MAGNESIUM: 2.4 mg/dL (ref 1.5–2.5)

## 2015-01-04 LAB — TRIGLYCERIDES: TRIGLYCERIDES: 292 mg/dL — AB (ref <150)

## 2015-01-04 MED ORDER — FUROSEMIDE 10 MG/ML IJ SOLN
40.0000 mg | Freq: Four times a day (QID) | INTRAMUSCULAR | Status: AC
  Administered 2015-01-04 (×2): 40 mg via INTRAVENOUS
  Filled 2015-01-04 (×2): qty 4

## 2015-01-04 MED ORDER — INSULIN GLARGINE 100 UNIT/ML ~~LOC~~ SOLN
10.0000 [IU] | Freq: Two times a day (BID) | SUBCUTANEOUS | Status: DC
  Administered 2015-01-04 – 2015-01-13 (×20): 10 [IU] via SUBCUTANEOUS
  Filled 2015-01-04 (×25): qty 0.1

## 2015-01-04 NOTE — Progress Notes (Signed)
Waisted 35 ml of fentanyl from previous drip. Witnessed with second RN Asher Babilonia/[facility] [employer]

## 2015-01-04 NOTE — Progress Notes (Signed)
eLink Physician-Brief Progress Note Patient Name: [name] DOB: 29-Jan-1951 MRN: [id number]   Date of Service  01/04/2015  HPI/Events of Note  Called with "panic" ABG = 7.40/62.6/87.0/37.0  eICU Interventions  Continue present management.      Intervention Category Major Interventions: Respiratory failure - evaluation and management  Arnell Mausolf [place] 01/04/2015, 4:20 AM

## 2015-01-04 NOTE — Progress Notes (Signed)
NUTRITION FOLLOW-UP  INTERVENTION: -Continue Vital HP @ 40 mL/hr via OGT. -60 ml Prostat QID.    Tube feeding regimen 1760 kcal (11.6 kcal/kg actual body weight), 196 grams of protein, and 806 ml of H2O.   RD to continue to monitor  NUTRITION DIAGNOSIS: Inadequate oral intake related to inability to eat as evidenced by NPO status, ongoing.  Goal: Enteral nutrition to provide 65-70% of estimated calorie needs based on ASPEN guidelines for hypocaloric, high protein feeding in critically ill obese individuals  Monitor:  TF regimen & tolerance, respiratory status, weight, labs, I/O's  Admitting Dx: Acute encephalopathy  ASSESSMENT: 64 y/o with h/o HTN who was admitted to step down after difficulty extubating after rotator cuff repair. Has a 44 pack year smoking hx but no formal COPD diagnosis. 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  S/p 4/22 Procedure(s) (LRB): RIGHT SHOULDER ARTHROSCOPY WITH SUBACROMIAL DECOMPRESSION, DISTAL CLAVICLE RESECTION, ROTATOR CUFF REPAIR (Right)  - Pt tolerating TF at goal rate with minimal residuals.  - Propofol has been off since yesterday.  - Reglan d/c'd - Pt -6.8 L since admission. Weight trending down.   Patient is currently intubated on ventilator support MV: 11.3 L/min Temp (24hrs), Avg:102.8 F (39.3 C), Min:102.5 F (39.2 C), Max:103.1 F (39.5 C)  Propofol: none  Height: Ht Readings from Last 1 Encounters:  12/29/14  (1.803 m)    Weight: Wt Readings from Last 1 Encounters:  01/03/15 334 lb (151.501 kg)    Ideal Body Weight: 172 lb  % Ideal Body Weight: 194%  Wt Readings from Last 10 Encounters:  01/03/15 334 lb (151.501 kg)  11/29/14 360 lb 3.2 oz (163.386 kg)   BMI:  Body mass index is 46.6 kg/(m^2).  Re-estimated Nutritional Needs: Kcal: 2725             1667- 2121 (11-14 kcal/kg actual  body weight) Protein: >196 g Fluid: Per MD  Skin: shoulder incision, scrotum wound  Diet Order: Diet - low sodium heart healthy  EDUCATION NEEDS: -No education needs identified at this time   Intake/Output Summary (Last 24 hours) at 01/04/15 1256 Last data filed at 01/04/15 1200  Gross per 24 hour  Intake 2195.16 ml  Output   2825 ml  Net -629.84 ml    Last BM: 4/27  Labs:   Recent Labs Lab 01/01/15 0510 01/01/15 1809 01/02/15 0430 01/04/15 0350  NA 140 142 143  --   K 4.2 4.1 4.0  --   CL 95* 93* 98  --   CO2 36* 37* 40*  --   BUN 22 24* 30*  --   CREATININE 0.75 0.67 0.78  --   CALCIUM 9.2 9.3 9.0  --   MG  --  2.0  --  2.4  PHOS  --   --   --  2.6  GLUCOSE 147* 177* 142*  --     CBG (last 3)   Recent Labs  01/03/15 2324 01/04/15 0354 01/04/15 0750  GLUCAP 205* 200* 188*    Scheduled Meds: . antiseptic oral rinse  7 mL Mouth Rinse QID  . aspirin  325 mg Oral Daily  . budesonide  0.25 mg Nebulization 4 times per day  . ceFEPime (MAXIPIME) IV  2 g Intravenous Q8H  . chlorhexidine  15 mL Mouth Rinse BID  . famotidine  20 mg Per Tube BID  .  feeding supplement (PRO-STAT SUGAR FREE 64)  60 mL Oral QID  . feeding supplement (VITAL HIGH PROTEIN)  1,000 mL Per Tube Q24H  . folic acid  1 mg Per Tube Daily  . free water  100 mL Per Tube 3 times per day  . furosemide  40 mg Intravenous Q6H  . heparin subcutaneous  5,000 Units Subcutaneous 3 times per day  . insulin aspart  0-15 Units Subcutaneous 6 times per day  . insulin glargine  10 Units Subcutaneous BID  . ipratropium-albuterol  3 mL Nebulization Q6H  . lisinopril  5 mg Oral Daily  . metoprolol tartrate  25 mg Per Tube BID  . sodium chloride  10-40 mL Intracatheter Q12H  . vancomycin  1,500 mg Intravenous Q12H    Continuous Infusions: . sodium chloride 10 mL/hr at 01/03/15 0223  . fentaNYL infusion INTRAVENOUS Stopped (01/03/15 1647)  . propofol (DIPRIVAN) infusion Stopped (01/03/15 0834)     Emmaline KluverHaley Joleena Weisenburger MS, RD, LDN [phone]

## 2015-01-04 NOTE — Progress Notes (Addendum)
PULMONARY / CRITICAL CARE MEDICINE   Name: [name] MRN: [id number] DOB: Jun 19, 1951    ADMISSION DATE:  12/28/2014 CONSULTATION DATE:  12/28/14   REFERRING MD :  [name]  PT PROFILE:  37 morbidly obese male admitted 4/22 by Ortho for elective R rotator cuff tear. Intubated early 4/23 AM for post operative hypercarbic/hypoxic resp failure.  STUDIES:  4/26  ECHO >> nml LV, EF 60-65%, Grade 1 DD, trivial pericardial effusion without evidence of tamponade 4/27 [place] testes with masses   MAJOR EVENTS/TEST RESULTS: 4/22  elective R shoulder surgery 4/23  reintubated for hypercarbic/hypoxic resp failure 4/23  US scrotum: Two solid extratesticular right scrotal masses. Primary differential diagnosis for extratesticular masses include primary or metastatic neoplasms including adenomatoid tumor, fibroma, leiomyoma, mesothelioma, or sarcoma. Urology consultation is recommended  4/24  MV support, 8 PEEP, 80%.  Sedate.  4/26  MV support, PEEP 8, 60% 4/27  PEEP 8, 50%.  Tolerating TF, residuals improved. Net neg 2L 4/29 NEEDS TRACH  INDWELLING DEVICES:: ETT 4/23 >>  L radial A-line 4/23 >> 4/25 PICC LUE 4/23 >>   MICRO DATA: PCT 4/23: 0.69, 4/24: 0.44, 4/25: 0.46 Urine 4/23 >> neg Resp 4/23 >> neg  4/27 bc>>ngtd>> 4/27 uc>>neg 4/27 sputum>>ngtd>>    ANTIMICROBIALS:  Cefazolin 4/22 >> 4/23 Levofloxacin 4/23 >> 4/27 4/27 vanc>> 4/27 cefepime>>   SUBJECTIVE:   Comfortable on vent; weaned several hours with high PEEP, FiO2  VITAL SIGNS: Temp:  [102.5 F (39.2 C)-103.1 F (39.5 C)] 102.8 F (39.3 C) (04/29 0800) Pulse Rate:  [88-110] 109 (04/29 0800) Resp:  [18-40] 20 (04/29 0800) BP: (127-197)/(66-96) 169/96 mmHg (04/29 0800) SpO2:  [90 %-95 %] 90 % (04/29 0800) FiO2 (%):  [60 %-65 %] 65 % (04/29 0800)   HEMODYNAMICS:     VENTILATOR SETTINGS: Vent Mode:  [-] CPAP;PSV FiO2 (%):  [60 %-65 %] 65 % Set Rate:  [14 bmp] 14 bmp Vt Set:  [600 mL] 600 mL PEEP:  [8 cmH20]  8 cmH20 Pressure Support:  [5 cmH20] 5 cmH20 Plateau Pressure:  [21 cmH20-22 cmH20] 22 cmH20   INTAKE / OUTPUT:  Intake/Output Summary (Last 24 hours) at 01/04/15 4098 Last data filed at 01/04/15 0800  Gross per 24 hour  Intake 2114.16 ml  Output   2775 ml  Net -660.84 ml    PHYSICAL EXAMINATION: General: Morbidly obese male on vent in NAD Neuro: Sedate, follows commands on WUA HEENT: NCAT, very short, thick neck Cardiovascular: Reg, no M Lungs: even/non-labored, lungs bilaterally distant but clear Abdomen:  Obese, +BS, soft Ext: R shoulder surgical dressing, no edema, sling in place  LABS:  CBC  Recent Labs Lab 01/02/15 0430 01/03/15 0530 01/04/15 0350  WBC 11.3* 11.1* 12.2*  HGB 16.2 16.0 16.7  HCT 51.3 52.0 54.2*  PLT 186 199 185   Coag's No results for input(s): APTT, INR in the last 168 hours.   BMET  Recent Labs Lab 01/01/15 0510 01/01/15 1809 01/02/15 0430  NA 140 142 143  K 4.2 4.1 4.0  CL 95* 93* 98  CO2 36* 37* 40*  BUN 22 24* 30*  CREATININE 0.75 0.67 0.78  GLUCOSE 147* 177* 142*   Electrolytes  Recent Labs Lab 01/01/15 0510 01/01/15 1809 01/02/15 0430 01/04/15 0350  CALCIUM 9.2 9.3 9.0  --   MG  --  2.0  --  2.4  PHOS  --   --   --  2.6   Sepsis Markers  Recent Labs Lab 12/29/14  1106 12/30/14 0548 12/31/14 0400  PROCALCITON 0.69 0.44 0.46   ABG  Recent Labs Lab 12/29/14 0300 01/02/15 1300 01/04/15 0406  PHART 7.279* 7.473* 7.402  PCO2ART 67.1* 55.5* 62.6*  PO2ART 110.0* 71.9* 87.3   Liver Enzymes  Recent Labs Lab 12/30/14 0548 01/02/15 0430  AST 50* 45*  ALT 34 29  ALKPHOS 69 80  BILITOT 1.1 1.9*  ALBUMIN 3.1* 3.3*   Cardiac Enzymes  Recent Labs Lab 12/28/14 2150 12/29/14 0452 12/29/14 1106  TROPONINI 0.05* 0.18* 0.17*   Glucose  Recent Labs Lab 01/03/15 0752 01/03/15 1207 01/03/15 1549 01/03/15 1954 01/03/15 2324 01/04/15 0354  GLUCAP 174* 203* 180* 192* 205* 200*    CXR: 4/27 -  cardiomegaly, lines in good position, CHF with interstitial edema     ASSESSMENT / PLAN:  PULMONARY A:  Acute Hypercarbic / Hypoxemic Resp Failure RLL ATX - improved Pulmonary Edema - suspect in setting of dCHF 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 (Hgb 18, serum CO2 32) Likely OHS/OSA Anticipate difficult airway due to body habitus P:   Cont MV support, 8 cc/kg Wean PEEP/FiO2 for sats > 90% Daily SBT / WUA  Trend CXR  Pulmicort Q6 He will need trach to come off vent due to body habitus. Consider early trach.  CARDIOVASCULAR A:  Hypotension - resolved. Minimal elevation of trop I - doubt ACS Hx HTN Hypertriglyceridemia  Diastolic Dysfunction - grade 1 on ECHO 4/26 VT - episode x 2 4/26 P:  Monitor BP and rhythm PRN hydralazine  ASA 325 QD Monitor rhythm  Continue ACE, metoprolol  RENAL/GU A:   Scrotal mass P:   Monitor BMET intermittently Monitor I/Os Correct electrolytes as indicated Urology consultation after acute critical phase of illness resolves for scrotal mass Free water 100 Q8  GASTROINTESTINAL A:  Obesity Vent Associated Dysphagia - high residuals for TF P:   SUP: enteral famotidine Cont TFs Monitor residuals D/C further reglan with VT episodes  HEMATOLOGIC A:   Polycythemia - likely chronic hypoxemia Macrocytosis  P:  DVT px: SQ heparin Monitor CBC intermittently Transfuse per usual ICU guidelines Folate  INFECTIOUS A:   HCAP   increased temp 4/27 Hcap coverage started 4/27 P:  4/27 bc>>ngtd>> 4/27 uc>>ng 4/27 sputum>>ngtd>> 4/27 vanc>> 4/27 cefepime>>  ENDOCRINE CBG (last 3)   Recent Labs  01/03/15 1954 01/03/15 2324 01/04/15 0354  GLUCAP 192* 205* 200*     A:   Hyperglycemia, controlled   P:   SSI  Q4 CBG 4/29 add lantus   NEUROLOGIC A:  Acute Hypercarbic Encephalopathy Post op pain s/p R rotator cuff surgery ICU associated discomfort P:   RASS goal: -1 Cont propofol and fentanyl  gtt Monitor Triglycerides, may need to change to versed PT when able  Defer further post-op care to Ortho  Daily WUA / SBT Pursue early trach to decrease sedation needs   GLOBAL:  No other family at bedside am 4/29    [facility] Minor ACNP Adolph Pollack PCCM Pager [phone] till 3 pm If no answer page [phone] 01/04/2015, 9:05 AM   Attending:  I have seen and examined the patient with nurse practitioner/resident and agree with the note above.   This morning Mr. Krenzer has made little improvement in terms of his oxygenation.  He has been able to breath spontaneously on pressure support mode, but his oxygenation needs remain high.  His exam is unchanged, anasarca, few crackles in bases bilaterally.  CXR unchanged again showing diffuse interstitial edema bilaterally.  Given  his fever and respiratory secretions we have treated him for HCAP, no clear consolidation on CXR but a lower lobe infiltrate could certainly be present and not visible considering his cardiomegaly.    Fever> suspect HCAP, consider CT chest 4/30 if still febrile  We will continue to treat HCAP and pulmonary edema through the weekend.  If no improvement then we should consider an early tracheostomy on 5/2 or 5/3.  Talked to brother on 4/29 AM   My CC time 35 minutes  Heber CarolinaBrent McQuaid, MD [place] [facility] Pager: [phone] Cell: [phone] If no response, call [phone]

## 2015-01-05 ENCOUNTER — Inpatient Hospital Stay (HOSPITAL_COMMUNITY): Payer: Worker's Compensation

## 2015-01-05 LAB — BLOOD GAS, ARTERIAL
ACID-BASE EXCESS: 9.9 mmol/L — AB (ref 0.0–2.0)
Bicarbonate: 36.6 mEq/L — ABNORMAL HIGH (ref 20.0–24.0)
Drawn by: 31814
FIO2: 0.8 %
O2 Saturation: 93.4 %
PEEP: 8 cmH2O
Patient temperature: 103.6
RATE: 14 resp/min
TCO2: 30.7 mmol/L (ref 0–100)
VT: 600 mL
pCO2 arterial: 64.7 mmHg (ref 35.0–45.0)
pH, Arterial: 7.386 (ref 7.350–7.450)
pO2, Arterial: 81.4 mmHg (ref 80.0–100.0)

## 2015-01-05 LAB — GLUCOSE, CAPILLARY
GLUCOSE-CAPILLARY: 175 mg/dL — AB (ref 70–99)
GLUCOSE-CAPILLARY: 184 mg/dL — AB (ref 70–99)
GLUCOSE-CAPILLARY: 191 mg/dL — AB (ref 70–99)
GLUCOSE-CAPILLARY: 205 mg/dL — AB (ref 70–99)
GLUCOSE-CAPILLARY: 247 mg/dL — AB (ref 70–99)
Glucose-Capillary: 214 mg/dL — ABNORMAL HIGH (ref 70–99)
Glucose-Capillary: 209 mg/dL — ABNORMAL HIGH (ref 70–99)
Glucose-Capillary: 172 mg/dL — ABNORMAL HIGH (ref 70–99)
Glucose-Capillary: 216 mg/dL — ABNORMAL HIGH (ref 70–99)

## 2015-01-05 LAB — BASIC METABOLIC PANEL
ANION GAP: 11 (ref 5–15)
BUN: 49 mg/dL — ABNORMAL HIGH (ref 6–23)
CO2: 37 mmol/L — ABNORMAL HIGH (ref 19–32)
Calcium: 9.7 mg/dL (ref 8.4–10.5)
Chloride: 108 mmol/L (ref 96–112)
Creatinine, Ser: 1.11 mg/dL (ref 0.50–1.35)
GFR, EST AFRICAN AMERICAN: 81 mL/min — AB (ref >90)
GFR, EST NON AFRICAN AMERICAN: 70 mL/min — AB (ref >90)
Glucose, Bld: 178 mg/dL — ABNORMAL HIGH (ref 70–99)
POTASSIUM: 3.5 mmol/L (ref 3.5–5.1)
SODIUM: 156 mmol/L — AB (ref 135–145)

## 2015-01-05 LAB — CBC
HEMATOCRIT: 54.8 % — AB (ref 39.0–52.0)
Hemoglobin: 16.6 g/dL (ref 13.0–17.0)
MCH: 31.7 pg (ref 26.0–34.0)
MCHC: 30.3 g/dL (ref 30.0–36.0)
MCV: 104.6 fL — AB (ref 78.0–100.0)
PLATELETS: 165 10*3/uL (ref 150–400)
RBC: 5.24 MIL/uL (ref 4.22–5.81)
RDW: 16.5 % — AB (ref 11.5–15.5)
WBC: 12.2 10*3/uL — ABNORMAL HIGH (ref 4.0–10.5)

## 2015-01-05 LAB — CULTURE, RESPIRATORY: CULTURE: NO GROWTH

## 2015-01-05 LAB — CULTURE, RESPIRATORY W GRAM STAIN

## 2015-01-05 LAB — VANCOMYCIN, TROUGH: Vancomycin Tr: 16.9 ug/mL (ref 10.0–20.0)

## 2015-01-05 MED ORDER — BUDESONIDE 0.25 MG/2ML IN SUSP
0.5000 mg | Freq: Two times a day (BID) | RESPIRATORY_TRACT | Status: DC
  Administered 2015-01-05 – 2015-01-10 (×8): 0.5 mg via RESPIRATORY_TRACT
  Filled 2015-01-05 (×12): qty 4

## 2015-01-05 MED ORDER — MIDAZOLAM HCL 2 MG/2ML IJ SOLN
INTRAMUSCULAR | Status: AC
  Filled 2015-01-05: qty 2

## 2015-01-05 MED ORDER — POTASSIUM CHLORIDE 20 MEQ/15ML (10%) PO SOLN
20.0000 meq | ORAL | Status: AC
  Administered 2015-01-05 (×2): 20 meq
  Filled 2015-01-05 (×2): qty 15

## 2015-01-05 NOTE — Progress Notes (Signed)
[facility] was on 01/04/15 at 0156 am, fentanyl 50 mcg IV push.  Propofol and fentanyl drips were stopped Thursday 28th at 8 am by nursing for WUA, and have not been restarted. The patient has not opened his eyes or made purposeful or non purposeful movement.  Pupils are round and brisk.  Notified Dr. Vassie LollAlva at rounding, of patient's neuro status.  Will continue to monitor.

## 2015-01-05 NOTE — Progress Notes (Signed)
PULMONARY / CRITICAL CARE MEDICINE   Name: Novah Lordi MRN: [id number] DOB: Dec 05, 1950    ADMISSION DATE:  12/28/2014 CONSULTATION DATE:  12/28/14   REFERRING MD :  [name]  PT PROFILE:  60 morbidly obese male admitted 4/22 by Ortho for elective R rotator cuff tear. Intubated early 4/23 AM for post operative hypercarbic/hypoxic resp failure.  STUDIES:  4/26  ECHO >> nml LV, EF 60-65%, Grade 1 DD, trivial pericardial effusion without evidence of tamponade 4/27 [place] testes with masses   MAJOR EVENTS/TEST RESULTS: 4/22  elective R shoulder surgery 4/23  reintubated for hypercarbic/hypoxic resp failure 4/23  US scrotum: Two solid extratesticular right scrotal masses. Primary differential diagnosis for extratesticular masses include primary or metastatic neoplasms including adenomatoid tumor, fibroma, leiomyoma, mesothelioma, or sarcoma. Urology consultation is recommended  4/24  MV support, 8 PEEP, 80%.  Sedate.  4/26  MV support, PEEP 8, 60% 4/27  PEEP 8, 50%.  Tolerating TF, residuals improved. Net neg 2L   INDWELLING DEVICES:: ETT 4/23 >>  L radial A-line 4/23 >> 4/25 PICC LUE 4/23 >>   MICRO DATA: PCT 4/23: 0.69, 4/24: 0.44, 4/25: 0.46 Urine 4/23 >> neg Resp 4/23 >> neg  4/27 bc>>ngtd>> 4/27 uc>>neg 4/27 sputum>>ngtd>>    ANTIMICROBIALS:  Cefazolin 4/22 >> 4/23 Levofloxacin 4/23 >> 4/27 4/27 vanc>> 4/27 cefepime>>   SUBJECTIVE:   Worsening  FiO2, up to 100% Remains obtunded off sedation Good UO  VITAL SIGNS: Temp:  [102.5 F (39.2 C)-103.6 F (39.8 C)] 102.8 F (39.3 C) (04/30 0745) Pulse Rate:  [70-118] 99 (04/30 1011) Resp:  [19-24] 22 (04/30 1011) BP: (110-185)/(61-88) 117/72 mmHg (04/30 1000) SpO2:  [87 %-94 %] 89 % (04/30 1011) FiO2 (%):  [65 %-100 %] 100 % (04/30 1011)   HEMODYNAMICS:     VENTILATOR SETTINGS: Vent Mode:  [-] PRVC FiO2 (%):  [65 %-100 %] 100 % Set Rate:  [14 bmp] 14 bmp Vt Set:  [600 mL] 600 mL PEEP:  [8 cmH20] 8  cmH20 Pressure Support:  [5 cmH20] 5 cmH20 Plateau Pressure:  [22 cmH20-26 cmH20] 22 cmH20   INTAKE / OUTPUT:  Intake/Output Summary (Last 24 hours) at 01/05/15 1042 Last data filed at 01/05/15 1000  Gross per 24 hour  Intake   1440 ml  Output   3350 ml  Net  -1910 ml    PHYSICAL EXAMINATION: General: Morbidly obese male on vent in NAD Neuro: Sedate, follows commands on WUA HEENT: NCAT, very short, thick neck Cardiovascular: Reg, no M Lungs: even/non-labored, lungs bilaterally distant but clear Abdomen:  Obese, +BS, soft Ext: R shoulder surgical dressing, no edema, sling in place  LABS:  CBC  Recent Labs Lab 01/03/15 0530 01/04/15 0350 01/05/15 0419  WBC 11.1* 12.2* 12.2*  HGB 16.0 16.7 16.6  HCT 52.0 54.2* 54.8*  PLT 199 185 165   Coag's No results for input(s): APTT, INR in the last 168 hours.   BMET  Recent Labs Lab 01/01/15 1809 01/02/15 0430 01/05/15 0419  NA 142 143 156*  K 4.1 4.0 3.5  CL 93* 98 108  CO2 37* 40* 37*  BUN 24* 30* 49*  CREATININE 0.67 0.78 1.11  GLUCOSE 177* 142* 178*   Electrolytes  Recent Labs Lab 01/01/15 1809 01/02/15 0430 01/04/15 0350 01/05/15 0419  CALCIUM 9.3 9.0  --  9.7  MG 2.0  --  2.4  --   PHOS  --   --  2.6  --    Sepsis Markers  Recent  Labs Lab 12/29/14 1106 12/30/14 0548 12/31/14 0400  PROCALCITON 0.69 0.44 0.46   ABG  Recent Labs Lab 01/02/15 1300 01/04/15 0406 01/05/15 0500  PHART 7.473* 7.402 7.386  PCO2ART 55.5* 62.6* 64.7*  PO2ART 71.9* 87.3 81.4   Liver Enzymes  Recent Labs Lab 12/30/14 0548 01/02/15 0430  AST 50* 45*  ALT 34 29  ALKPHOS 69 80  BILITOT 1.1 1.9*  ALBUMIN 3.1* 3.3*   Cardiac Enzymes  Recent Labs Lab 12/29/14 1106  TROPONINI 0.17*   Glucose  Recent Labs Lab 01/04/15 0354 01/04/15 0750 01/04/15 1944 01/05/15 0016 01/05/15 0429 01/05/15 0826  GLUCAP 200* 188* 225* 205* 172* 175*    CXR: 4/30 - cardiomegaly, lines in good position, CHF with  interstitial edema vs pneumonia    ASSESSMENT / PLAN:  PULMONARY A:  Acute Hypercarbic / Hypoxemic Resp Failure RLL ATX - improved Pulmonary Edema - suspect in setting of dCHF 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 (Hgb 18, serum CO2 32) Likely OHS/OSA Anticipate difficult airway due to body habitus P:   Cont MV support, 8 cc/kg Increase PEEP/ wean FiO2 for sats > 90% Hold off SBT  While FIO2 requirement high Pulmicort Q6 Consider early trach.  CARDIOVASCULAR A:  Hypotension - resolved. Minimal elevation of trop I - doubt ACS Hx HTN Hypertriglyceridemia  Diastolic Dysfunction - grade 1 on ECHO 4/26 VT - episode x 2 4/26 P:  Monitor BP and rhythm PRN hydralazine  ASA 325 QD Monitor rhythm  HOLD ACE, ct metoprolol  RENAL/GU A:  Hypernatremia , AKI 4/30  Scrotal mass P:   Monitor BMET intermittently Monitor I/Os Correct electrolytes as indicated Urology consultation after acute critical phase of illness resolves for scrotal mass Free water 100 Q8 Hold lasix, appears overdiuresed  GASTROINTESTINAL A:  Obesity Vent Associated Dysphagia - high residuals for TF P:   SUP: enteral famotidine Cont TFs Monitor residuals D/C further reglan with VT episodes  HEMATOLOGIC A:   Polycythemia - likely chronic hypoxemia Macrocytosis  P:  DVT px: SQ heparin Monitor CBC intermittently Transfuse per usual ICU guidelines Folate  INFECTIOUS A:   HCAP   increased temp 4/27 Hcap coverage started 4/27 P:  4/27 bc>>ngtd 4/27 uc>>ng 4/27 sputum>>ngtd 4/27 vanc>> 4/27 cefepime>>  ENDOCRINE CBG (last 3)   Recent Labs  01/05/15 0016 01/05/15 0429 01/05/15 0826  GLUCAP 205* 172* 175*     A:   Hyperglycemia, controlled   P:   SSI  Q4 CBG 4/29 added  lantus   NEUROLOGIC A:  Acute Hypercarbic Encephalopathy Post op pain s/p R rotator cuff surgery ICU associated discomfort P:   RASS goal: -1 Cont propofol and fentanyl gtt Monitor  Triglycerides, may need to change to versed Defer further post-op care to Ortho  Daily WUA    GLOBAL: Talked to brother on 4/29 AM  Summary  - suspect HCAP, rather than pulmonary edema now that diuresed well but FIO2 remains high  We will continue to treat HCAP and pulmonary edema through the weekend.  If no improvement then we should consider an early tracheostomy on 5/2 or 5/3.    The patient is critically ill with multiple organ systems failure and requires high complexity decision making for assessment and support, frequent evaluation and titration of therapies, application of advanced monitoring technologies and extensive interpretation of multiple databases. Critical Care Time devoted to patient care services described in this note independent of APP time is 35 minutes.   Cyril Mourning MD. Tonny Bollman. [facility] Pager [phone] If no response call [phone]     01/05/2015, 10:42 AM

## 2015-01-05 NOTE — Progress Notes (Signed)
ANTIBIOTIC CONSULT NOTE   Pharmacy Consult for vancomycin and cefepime Indication: HCAP  No Known Allergies  Patient Measurements: Height:  (180.3 cm) Weight: (!) 334 lb (151.501 kg) IBW/kg (Calculated) : 75.3  Vital Signs: Temp: 103.3 F (39.6 C) (04/30 0404) Temp Source: Axillary (04/30 0404) BP: 133/74 mmHg (04/30 0500) Pulse Rate: 102 (04/30 0500) Intake/Output from previous day: 04/29 0701 - 04/30 0700 In: 1590 [I.V.:210; NG/GT:980; IV Piggyback:400] Out: 3775 [Urine:3775] Intake/Output from this shift:    Labs:  Recent Labs  01/03/15 0530 01/04/15 0350 01/05/15 0419  WBC 11.1* 12.2* 12.2*  HGB 16.0 16.7 16.6  PLT 199 185 165  CREATININE  --   --  1.11   Estimated Creatinine Clearance: 104.6 mL/min (by C-G formula based on Cr of 1.11).  Recent Labs  01/05/15 0400  VANCOTROUGH 16.9     Microbiology: Recent Results (from the past 720 hour(s))  MRSA PCR Screening     Status: None   Collection Time: 12/28/14 10:18 PM  Result Value Ref Range Status   MRSA by PCR NEGATIVE NEGATIVE Final    Comment:        The GeneXpert MRSA Assay (FDA approved for NASAL specimens only), is one component of a comprehensive MRSA colonization surveillance program. It is not intended to diagnose MRSA infection nor to guide or monitor treatment for MRSA infections.   Culture, Urine     Status: None   Collection Time: 12/29/14 11:34 AM  Result Value Ref Range Status   Specimen Description URINE, CATHETERIZED  Final   Special Requests NONE  Final   Colony Count NO GROWTH Performed at [facility]   Final   Culture NO GROWTH Performed at [facility]   Final   Report Status 12/31/2014 FINAL  Final  Culture, respiratory (NON-Expectorated)     Status: None   Collection Time: 12/29/14  1:28 PM  Result Value Ref Range Status   Specimen Description TRACHEAL ASPIRATE  Final   Special Requests NONE  Final   Gram Stain   Final    FEW WBC  PRESENT,BOTH PMN AND MONONUCLEAR RARE SQUAMOUS EPITHELIAL CELLS PRESENT NO ORGANISMS SEEN Performed at [facility]    Culture   Final    NO GROWTH 2 DAYS Performed at [facility]    Report Status 12/31/2014 FINAL  Final  Culture, respiratory (NON-Expectorated)     Status: None (Preliminary result)   Collection Time: 01/02/15 10:48 AM  Result Value Ref Range Status   Specimen Description TRACHEAL ASPIRATE  Final   Special Requests NONE  Final   Gram Stain   Final    RARE WBC PRESENT, PREDOMINANTLY PMN NO SQUAMOUS EPITHELIAL CELLS SEEN NO ORGANISMS SEEN Performed at [facility]    Culture   Final    NO GROWTH 1 DAY Performed at [facility]    Report Status PENDING  Incomplete  Urine culture     Status: None   Collection Time: 01/02/15 11:00 AM  Result Value Ref Range Status   Specimen Description URINE, CATHETERIZED  Final   Special Requests NONE  Final   Colony Count NO GROWTH Performed at [facility]   Final   Culture NO GROWTH Performed at [facility]   Final   Report Status 01/04/2015 FINAL  Final  Culture, blood (routine x 2)     Status: None (Preliminary result)   Collection Time: 01/02/15  4:44 PM  Result Value Ref Range Status  Specimen Description BLOOD LEFT ARM  Final   Special Requests BOTTLES DRAWN AEROBIC ONLY 6CC  Final   Culture   Final           BLOOD CULTURE RECEIVED NO GROWTH TO DATE CULTURE WILL BE HELD FOR 5 DAYS BEFORE ISSUING A FINAL NEGATIVE REPORT [facility]    Report Status PENDING  Incomplete  Culture, blood (routine x 2)     Status: None (Preliminary result)   Collection Time: 01/02/15  6:30 PM  Result Value Ref Range Status   Specimen Description BLOOD LEFT HAND  Final   Special Requests BOTTLES DRAWN AEROBIC ONLY 10CC  Final   Culture   Final           BLOOD CULTURE RECEIVED NO GROWTH TO DATE CULTURE WILL BE HELD FOR 5 DAYS BEFORE ISSUING A FINAL NEGATIVE  REPORT [facility]formed at [facility]    Report Status PENDING  Incomplete   Medical History: Past Medical History  Diagnosis Date  . Arthritis   . RCT (rotator cuff tear)   . Multiple abrasions     rt arm  . Morbid obesity    Medications:  See med rec   Assessment: Patient's a 64 y.o. morbidly obese male admitted 4/22 by Ortho for elective R rotator cuff tear. He was subsequently Intubated early 4/23 AM for post operative hypercarbic/hypoxic resp failure.  He remains febrile with Tmax 102, wbc elevated at 11.3, and hypoxic.  To start broad abx with vancomycin and cefepime for suspected HCAP.  4/23 Levaquin >> 4/26  4/27 vanc>> 4/27 cefepime>>  4/27 TA: ngtd 4/27 bcx x2: ngtd 4/27 ucx: no growth-final 4/23 TA: neg FINAL 4/23 ucx: neg FINAL  Vanc trough today at 0500 = 16.9 mcg/ml, level in range  Goal of Therapy:  Vancomycin trough level 15-20 mcg/ml  Plan:   Continue Cefepime 2gm IV q8h  Continue Vancomycin 1500 mg IV q12h  Otho BellowsGreen, Symone Cornman L PharmD Pager [phone] 01/05/2015, 7:39 AM

## 2015-01-05 NOTE — Progress Notes (Signed)
[facility]OC[facility] FOR AM LAB REPLACEMENT ONLY  The [facility] based on the criteria listed below:   1. Is GFR >/= 40 ml/min? Yes.    Patient's GFR today is 70 2. Is urine output >/= 0.5 ml/kg/hr for the last 6 hours? Yes.   Patient's UOP is 0.6 ml/kg/hr 3. Is BUN < 60 mg/dL? Yes.    Patient's BUN today is 49 4. Abnormal electrolyte(s): Potassium 3.5 5. Ordered repletion with: [facility] adult ICU replacement protocol 6. If a panic level lab has been reported, has the CCM MD in charge been notified? Yes.  .   Physician:  Dr. Loletha GrayerSteven Sommer  [facility], Alda BertholdYOUNKAI E 01/05/2015 6:18 AM

## 2015-01-06 ENCOUNTER — Inpatient Hospital Stay (HOSPITAL_COMMUNITY): Payer: Worker's Compensation

## 2015-01-06 DIAGNOSIS — J9811 Atelectasis: Secondary | ICD-10-CM

## 2015-01-06 DIAGNOSIS — N179 Acute kidney failure, unspecified: Secondary | ICD-10-CM | POA: Diagnosis not present

## 2015-01-06 LAB — BLOOD GAS, ARTERIAL
Acid-Base Excess: 7.1 mmol/L — ABNORMAL HIGH (ref 0.0–2.0)
Bicarbonate: 32.4 mEq/L — ABNORMAL HIGH (ref 20.0–24.0)
Drawn by: 308601
FIO2: 0.7 %
O2 Saturation: 97.2 %
PATIENT TEMPERATURE: 37
PCO2 ART: 49.3 mmHg — AB (ref 35.0–45.0)
PEEP: 10 cmH2O
PH ART: 7.434 (ref 7.350–7.450)
RATE: 14 resp/min
TCO2: 27.5 mmol/L (ref 0–100)
VT: 600 mL
pO2, Arterial: 95.8 mmHg (ref 80.0–100.0)

## 2015-01-06 LAB — LACTIC ACID, PLASMA
LACTIC ACID, VENOUS: 1.8 mmol/L (ref 0.5–2.0)
Lactic Acid, Venous: 1.8 mmol/L (ref 0.5–2.0)

## 2015-01-06 LAB — BASIC METABOLIC PANEL
Anion gap: 8 (ref 5–15)
Anion gap: 11 (ref 5–15)
BUN: 91 mg/dL — ABNORMAL HIGH (ref 6–20)
BUN: 96 mg/dL — AB (ref 6–20)
CALCIUM: 9.1 mg/dL (ref 8.9–10.3)
CHLORIDE: 113 mmol/L — AB (ref 101–111)
CO2: 33 mmol/L — AB (ref 22–32)
CO2: 30 mmol/L (ref 22–32)
Calcium: 9.3 mg/dL (ref 8.9–10.3)
Chloride: 111 mmol/L (ref 101–111)
Creatinine, Ser: 3.42 mg/dL — ABNORMAL HIGH (ref 0.61–1.24)
Creatinine, Ser: 3.28 mg/dL — ABNORMAL HIGH (ref 0.61–1.24)
GFR calc Af Amer: 21 mL/min — ABNORMAL LOW (ref >60)
GFR calc Af Amer: 22 mL/min — ABNORMAL LOW (ref >60)
GFR calc non Af Amer: 18 mL/min — ABNORMAL LOW (ref >60)
GFR calc non Af Amer: 19 mL/min — ABNORMAL LOW (ref >60)
GLUCOSE: 196 mg/dL — AB (ref 70–99)
GLUCOSE: 223 mg/dL — AB (ref 70–99)
POTASSIUM: 3.8 mmol/L (ref 3.5–5.1)
POTASSIUM: 3.7 mmol/L (ref 3.5–5.1)
SODIUM: 152 mmol/L — AB (ref 135–145)
Sodium: 154 mmol/L — ABNORMAL HIGH (ref 135–145)

## 2015-01-06 LAB — CBC
HCT: 52.7 % — ABNORMAL HIGH (ref 39.0–52.0)
HEMOGLOBIN: 15.9 g/dL (ref 13.0–17.0)
MCH: 31.6 pg (ref 26.0–34.0)
MCHC: 30.2 g/dL (ref 30.0–36.0)
MCV: 104.8 fL — ABNORMAL HIGH (ref 78.0–100.0)
PLATELETS: 121 10*3/uL — AB (ref 150–400)
RBC: 5.03 MIL/uL (ref 4.22–5.81)
RDW: 16.8 % — AB (ref 11.5–15.5)
WBC: 15 10*3/uL — ABNORMAL HIGH (ref 4.0–10.5)

## 2015-01-06 LAB — VANCOMYCIN, TROUGH: Vancomycin Tr: 43 ug/mL (ref 10.0–20.0)

## 2015-01-06 LAB — GLUCOSE, CAPILLARY
GLUCOSE-CAPILLARY: 192 mg/dL — AB (ref 70–99)
GLUCOSE-CAPILLARY: 192 mg/dL — AB (ref 70–99)
Glucose-Capillary: 167 mg/dL — ABNORMAL HIGH (ref 70–99)
Glucose-Capillary: 194 mg/dL — ABNORMAL HIGH (ref 70–99)
Glucose-Capillary: 213 mg/dL — ABNORMAL HIGH (ref 70–99)
Glucose-Capillary: 215 mg/dL — ABNORMAL HIGH (ref 70–99)
Glucose-Capillary: 217 mg/dL — ABNORMAL HIGH (ref 70–99)

## 2015-01-06 LAB — MAGNESIUM: Magnesium: 2.8 mg/dL — ABNORMAL HIGH (ref 1.7–2.4)

## 2015-01-06 LAB — TROPONIN I
Troponin I: 0.48 ng/mL — ABNORMAL HIGH (ref <0.031)
Troponin I: 0.39 ng/mL — ABNORMAL HIGH (ref <0.031)

## 2015-01-06 MED ORDER — ALTEPLASE 2 MG IJ SOLR
2.0000 mg | Freq: Once | INTRAMUSCULAR | Status: DC
  Filled 2015-01-06 (×2): qty 2

## 2015-01-06 MED ORDER — PHENYLEPHRINE HCL 10 MG/ML IJ SOLN
30.0000 ug/min | INTRAVENOUS | Status: DC
  Administered 2015-01-06: 110 ug/min via INTRAVENOUS
  Administered 2015-01-06: 90 ug/min via INTRAVENOUS
  Administered 2015-01-06: 125 ug/min via INTRAVENOUS
  Administered 2015-01-06: 150 ug/min via INTRAVENOUS
  Administered 2015-01-06: 75.333 ug/min via INTRAVENOUS
  Administered 2015-01-06: 200 ug/min via INTRAVENOUS
  Administered 2015-01-06: 30 ug/min via INTRAVENOUS
  Administered 2015-01-06: 175 ug/min via INTRAVENOUS
  Filled 2015-01-06 (×14): qty 1

## 2015-01-06 MED ORDER — SODIUM CHLORIDE 0.9 % IV BOLUS (SEPSIS)
500.0000 mL | Freq: Once | INTRAVENOUS | Status: AC
  Administered 2015-01-06: 500 mL via INTRAVENOUS

## 2015-01-06 MED ORDER — FREE WATER
200.0000 mL | Freq: Three times a day (TID) | Status: DC
  Administered 2015-01-06 – 2015-01-09 (×10): 200 mL

## 2015-01-06 MED ORDER — DEXTROSE 5 % IV SOLN
2.0000 g | INTRAVENOUS | Status: DC
  Administered 2015-01-07 – 2015-01-09 (×3): 2 g via INTRAVENOUS
  Filled 2015-01-06 (×4): qty 2

## 2015-01-06 MED ORDER — PHENYLEPHRINE HCL 10 MG/ML IJ SOLN
30.0000 ug/min | INTRAVENOUS | Status: DC
  Administered 2015-01-06: 200 ug/min via INTRAVENOUS
  Administered 2015-01-07: 160 ug/min via INTRAVENOUS
  Administered 2015-01-08: 95 ug/min via INTRAVENOUS
  Administered 2015-01-08: 120 ug/min via INTRAVENOUS
  Administered 2015-01-08: 100 ug/min via INTRAVENOUS
  Administered 2015-01-09: 60 ug/min via INTRAVENOUS
  Administered 2015-01-10 (×2): 70 ug/min via INTRAVENOUS
  Administered 2015-01-10: 60 ug/min via INTRAVENOUS
  Filled 2015-01-06 (×15): qty 4

## 2015-01-06 NOTE — Progress Notes (Signed)
eLink Physician-Brief Progress Note Patient Name: [name] DOB: [birth year] MRN: [id number]   Date of Service  01/06/2015  HPI/Events of Note  Drop inbp, some fluid given  eICU Interventions  LA now, cvp, neo, abg, ecg, trop      Intervention Category Intermediate Interventions: Hypotension - evaluation and management  [name] J. 01/06/2015, 5:01 AM

## 2015-01-06 NOTE — Progress Notes (Signed)
ANTIBIOTIC CONSULT NOTE   Pharmacy Consult for vancomycin and cefepime Indication: HCAP  No Known Allergies  Patient Measurements: Height: 5\' 11"  (180.3 cm) Weight: (!) 334 lb (151.501 kg) IBW/kg (Calculated) : 75.3  Vital Signs: Temp: 102.9 F (39.4 C) (05/01 0745) Temp Source: Axillary (05/01 0745) BP: 120/66 mmHg (05/01 1130) Pulse Rate: 88 (05/01 1130) Intake/Output from previous day: 04/30 0701 - 05/01 0700 In: 3421.1 [I.V.:350.1; NG/GT:1421; IV Piggyback:1650] Out: 955 [Urine:955] Intake/Output from this shift: Total I/O In: 964 [I.V.:694; NG/GT:220; IV Piggyback:50] Out: 140 [Urine:140]  Labs:  Recent Labs  01/04/15 0350 01/05/15 0419 01/06/15 0430 01/06/15 0500  WBC 12.2* 12.2*  --  15.0*  HGB 16.7 16.6  --  15.9  PLT 185 165  --  121*  CREATININE  --  1.11 3.42*  --    Estimated Creatinine Clearance: 33.9 mL/min (by C-G formula based on Cr of 3.42).  Recent Labs  01/05/15 0400  VANCOTROUGH 16.9     Microbiology: Recent Results (from the past 720 hour(s))  MRSA PCR Screening     Status: None   Collection Time: 12/28/14 10:18 PM  Result Value Ref Range Status   MRSA by PCR NEGATIVE NEGATIVE Final    Comment:        The GeneXpert MRSA Assay (FDA approved for NASAL specimens only), is one component of a comprehensive MRSA colonization surveillance program. It is not intended to diagnose MRSA infection nor to guide or monitor treatment for MRSA infections.   Culture, Urine     Status: None   Collection Time: 12/29/14 11:34 AM  Result Value Ref Range Status   Specimen Description URINE, CATHETERIZED  Final   Special Requests NONE  Final   Colony Count NO GROWTH Performed at [facility]   Final   Culture NO GROWTH Performed at [facility]   Final   Report Status 12/31/2014 FINAL  Final  Culture, respiratory (NON-Expectorated)     Status: None   Collection Time: 12/29/14  1:28 PM  Result Value Ref Range Status   Specimen Description TRACHEAL ASPIRATE  Final   Special Requests NONE  Final   Gram Stain   Final    FEW WBC PRESENT,BOTH PMN AND MONONUCLEAR RARE SQUAMOUS EPITHELIAL CELLS PRESENT NO ORGANISMS SEEN Performed at [facility]    Culture   Final    NO GROWTH 2 DAYS Performed at [facility]    Report Status 12/31/2014 FINAL  Final  Culture, respiratory (NON-Expectorated)     Status: None   Collection Time: 01/02/15 10:48 AM  Result Value Ref Range Status   Specimen Description TRACHEAL ASPIRATE  Final   Special Requests NONE  Final   Gram Stain   Final    RARE WBC PRESENT, PREDOMINANTLY PMN NO SQUAMOUS EPITHELIAL CELLS SEEN NO ORGANISMS SEEN Performed at [facility]    Culture   Final    NO GROWTH 2 DAYS Performed at [facility]    Report Status 01/05/2015 FINAL  Final  Urine culture     Status: None   Collection Time: 01/02/15 11:00 AM  Result Value Ref Range Status   Specimen Description URINE, CATHETERIZED  Final   Special Requests NONE  Final   Colony Count NO GROWTH Performed at [facility]   Final   Culture NO GROWTH Performed at [facility]   Final   Report Status 01/04/2015 FINAL  Final  Culture, blood (routine x 2)  Status: None (Preliminary result)   Collection Time: 01/02/15  4:44 PM  Result Value Ref Range Status   Specimen Description BLOOD LEFT ARM  Final   Special Requests BOTTLES DRAWN AEROBIC ONLY 6CC  Final   Culture   Final           BLOOD CULTURE RECEIVED NO GROWTH TO DATE CULTURE WILL BE HELD FOR 5 DAYS BEFORE ISSUING A FINAL NEGATIVE REPORT Performed at [facility]    Report Status PENDING  Incomplete  Culture, blood (routine x 2)     Status: None (Preliminary result)   Collection Time: 01/02/15  6:30 PM  Result Value Ref Range Status   Specimen Description BLOOD LEFT HAND  Final   Special Requests BOTTLES DRAWN AEROBIC ONLY 10CC  Final   Culture   Final           BLOOD CULTURE  RECEIVED NO GROWTH TO DATE CULTURE WILL BE HELD FOR 5 DAYS BEFORE ISSUING A FINAL NEGATIVE REPORT Performed at [facility]    Report Status PENDING  Incomplete   Medical History: Past Medical History  Diagnosis Date  . Arthritis   . RCT (rotator cuff tear)   . Multiple abrasions     rt arm  . Morbid obesity    Medications:  See med rec   Assessment: Patient's a 64 y.o. morbidly obese male admitted 4/22 by Ortho for elective R rotator cuff tear. He was subsequently Intubated early 4/23 AM for post operative hypercarbic/hypoxic resp failure.  He remains febrile with Tmax 102, wbc elevated at 11.3, and hypoxic.  To start broad abx with vancomycin and cefepime for suspected HCAP.  4/23 Levaquin >> 4/26  4/27 vanc>> 4/27 cefepime>>  4/27 TA: ngtd 4/27 bcx x2: ngtd 4/27 ucx: no growth-final 4/23 TA: neg FINAL 4/23 ucx: neg FINAL  Vanc trough 4/30 at 0500 = 16.9 mcg/ml, level in range, continued Vancomycin  q12  Today 01/06/2015 SCr increased to 3.42, need to adjust abx  Goal of Therapy:  Vancomycin trough level 15-20 mcg/ml  Plan:   Reduce Cefepime to 2gm q24  Will obtain Vancomycin trough at 1700  Will d/c Vanc order, anticipate dose change will be necessary  Consider reduced Pepcid to daily  Otho Bellows PharmD Pager [phone] 01/06/2015, 11:45 AM

## 2015-01-06 NOTE — Progress Notes (Signed)
Per MD okay to stick on left side where PICC is located.  RT to monitor and assess as needed.

## 2015-01-06 NOTE — Progress Notes (Signed)
Pharmacy Consult Note - Vancomycin Follow Up  Labs: Vanc Trough 43  A/P: Vanc trough supratherapeutic as expected (goal 15-20) given now acute renal failure. Vanc dose has already been discontinued. Will continue to hold vanc and recheck vanc levels as necessary   Hessie KnowsJustin M Edyth Glomb, PharmD, BCPS Pager [phone] 01/06/2015 8:26 PM

## 2015-01-06 NOTE — Progress Notes (Signed)
PULMONARY / CRITICAL CARE MEDICINE   Name: [name] MRN: [id number] DOB: Sep 05, 1951    ADMISSION DATE:  12/28/2014 CONSULTATION DATE:  12/28/14   REFERRING MD :  [name]  PT PROFILE:  87 morbidly obese male admitted 4/22 by Ortho for elective R rotator cuff tear. Intubated early 4/23 AM for post operative hypercarbic/hypoxic resp failure.  STUDIES:  4/26  ECHO >> nml LV, EF 60-65%, Grade 1 DD, trivial pericardial effusion without evidence of tamponade 4/27 [place] testes with masses   MAJOR EVENTS/TEST RESULTS: 4/22  elective R shoulder surgery 4/23  reintubated for hypercarbic/hypoxic resp failure 4/23  US scrotum: Two solid extratesticular right scrotal masses. Primary differential diagnosis for extratesticular masses include primary or metastatic neoplasms including adenomatoid tumor, fibroma, leiomyoma, mesothelioma, or sarcoma. Urology consultation is recommended  4/24  MV support, 8 PEEP, 80%.  Sedate.  4/26  MV support, PEEP 8, 60% 4/27  PEEP 8, 50%.  Tolerating TF, residuals improved. Net neg 2L 4/30 PEEP 10, 70%   INDWELLING DEVICES:: ETT 4/23 >>  L radial A-line 4/23 >> 4/25 PICC LUE 4/23 >>   MICRO DATA: PCT 4/23: 0.69, 4/24: 0.44, 4/25: 0.46 Urine 4/23 >> neg Resp 4/23 >> neg  4/27 bc>>ngtd>> 4/27 uc>>neg 4/27 sputum>>ngtd>>    ANTIMICROBIALS:  Cefazolin 4/22 >> 4/23 Levofloxacin 4/23 >> 4/27 4/27 vanc>> 4/27 cefepime>>   SUBJECTIVE:   Remains obtunded off sedation Poor UO off lasix febrile  VITAL SIGNS: Temp:  [101.3 F (38.5 C)-103.5 F (39.7 C)] 103.3 F (39.6 C) (05/01 0400) Pulse Rate:  [70-111] 89 (05/01 0800) Resp:  [16-26] 24 (05/01 0800) BP: (74-144)/(40-84) 103/69 mmHg (05/01 0800) SpO2:  [86 %-99 %] 95 % (05/01 0800) FiO2 (%):  [70 %-100 %] 70 % (05/01 0319)   HEMODYNAMICS:     VENTILATOR SETTINGS: Vent Mode:  [-] PRVC FiO2 (%):  [70 %-100 %] 70 % Set Rate:  [14 bmp] 14 bmp Vt Set:  [600 mL] 600 mL PEEP:  [8 cmH20-14  cmH20] 10 cmH20 Plateau Pressure:  [20 cmH20-27 cmH20] 20 cmH20   INTAKE / OUTPUT:  Intake/Output Summary (Last 24 hours) at 01/06/15 0835 Last data filed at 01/06/15 0802  Gross per 24 hour  Intake   2711 ml  Output    840 ml  Net   1871 ml    PHYSICAL EXAMINATION: General: Morbidly obese male on vent in NAD Neuro: Sedate, does not follow commands  HEENT: NCAT, very short, thick neck Cardiovascular: Reg, no M Lungs: even/non-labored, lungs bilaterally distant but clear Abdomen:  Obese, +BS, soft Ext: R shoulder surgical dressing, no edema, sling in place  LABS:  CBC  Recent Labs Lab 01/04/15 0350 01/05/15 0419 01/06/15 0500  WBC 12.2* 12.2* 15.0*  HGB 16.7 16.6 15.9  HCT 54.2* 54.8* 52.7*  PLT 185 165 121*   Coag's No results for input(s): APTT, INR in the last 168 hours.   BMET  Recent Labs Lab 01/02/15 0430 01/05/15 0419 01/06/15 0430  NA 143 156* 154*  K 4.0 3.5 3.8  CL 98 108 113*  CO2 40* 37* 33*  BUN 30* 49* 91*  CREATININE 0.78 1.11 3.42*  GLUCOSE 142* 178* 196*   Electrolytes  Recent Labs Lab 01/01/15 1809 01/02/15 0430 01/04/15 0350 01/05/15 0419 01/06/15 0430  CALCIUM 9.3 9.0  --  9.7 9.3  MG 2.0  --  2.4  --   --   PHOS  --   --  2.6  --   --  Sepsis Markers  Recent Labs Lab 12/31/14 0400 01/06/15 0430 01/06/15 0738  LATICACIDVEN  --  1.8 1.8  PROCALCITON 0.46  --   --    ABG  Recent Labs Lab 01/04/15 0406 01/05/15 0500 01/06/15 0440  PHART 7.402 7.386 7.434  PCO2ART 62.6* 64.7* 49.3*  PO2ART 87.3 81.4 95.8   Liver Enzymes  Recent Labs Lab 01/02/15 0430  AST 45*  ALT 29  ALKPHOS 80  BILITOT 1.9*  ALBUMIN 3.3*   Cardiac Enzymes  Recent Labs Lab 01/06/15 0738  TROPONINI 0.48*   Glucose  Recent Labs Lab 01/05/15 0826 01/05/15 1209 01/05/15 1528 01/05/15 1949 01/06/15 0001 01/06/15 0344  GLUCAP 175* 247* 216* 184* 192* 194*    CXR: 4/30 - cardiomegaly, lines in good position, CHF with  interstitial edema vs pneumonia    ASSESSMENT / PLAN:  PULMONARY A:  Acute Hypercarbic / Hypoxemic Resp Failure RLL ATX - improved, LL atx 4/30 Pulmonary Edema - suspect in setting of dCHF 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 (Hgb 18, serum CO2 32) Likely OHS/OSA Anticipate difficult airway due to body habitus P:   Cont MV support, 8 cc/kg Increase PEEP/ wean FiO2 for sats > 90% per ARDS protocol Hold off SBT  While FIO2 requirement high Pulmicort Q12 Consider early trach. Chest PT- if does not improve,may need bronchoscopy  CARDIOVASCULAR A:  Hypotension - resolved. Minimal elevation of trop I - doubt ACS Hx HTN Hypertriglyceridemia  Diastolic Dysfunction - grade 1 on ECHO 4/26 VT - episode x 2 4/26 P:  Monitor BP and rhythm PRN hydralazine  ASA 325 QD Monitor rhythm  HOLD ACE, hold metoprolol until BP picks up  RENAL/GU A:  Hypernatremia , AKI 4/30  Scrotal mass P:   Monitor BMET intermittently Monitor I/Os Correct electrolytes as indicated Urology consultation after acute critical phase of illness resolves for scrotal mass Free water 100 Q8 Hold lasix, appears overdiuresed  GASTROINTESTINAL A:  Obesity Vent Associated Dysphagia - 4/28 high residuals for TF Off reglan due to VT episodes P:   SUP: enteral famotidine Cont TFs Monitor residuals   HEMATOLOGIC A:   Polycythemia - likely chronic hypoxemia Macrocytosis  P:  DVT px: SQ heparin Monitor CBC intermittently Transfuse per usual ICU guidelines Folate  INFECTIOUS A:   HCAP   increased temp 4/27 Hcap coverage started 4/27 P:  4/27 bc>>ngtd 4/27 uc>>ng 4/27 sputum>>ngtd 4/27 vanc>> 4/27 cefepime>>  ENDOCRINE CBG (last 3)   Recent Labs  01/05/15 1949 01/06/15 0001 01/06/15 0344  GLUCAP 184* 192* 194*     A:   Hyperglycemia, controlled   P:   SSI  Q4 CBG 4/29 added  lantus   NEUROLOGIC A:  Acute Hypercarbic Encephalopathy Post op pain s/p R  rotator cuff surgery ICU associated discomfort P:   RASS goal: -1 Off propofol and fentanyl gtt -until more awake Monitor Triglycerides, may need to change to versed Defer further post-op care to Ortho  Daily WUA    GLOBAL: Talked to brother on 4/29 AM  Summary  - suspect HCAP with LLL atelectasis, rather than pulmonary edema -back off diuretics  We will continue to treat HCAP and atelectasisthrough the weekend.  If no improvement then we should consider an early tracheostomy on 5/2 or 5/3.    The patient is critically ill with multiple organ systems failure and requires high complexity decision making for assessment and support, frequent evaluation and titration of therapies, application of advanced monitoring technologies and extensive interpretation of multiple databases.  Critical Care Time devoted to patient care services described in this note independent of APP time is 35 minutes.   Cyril Mourning MD. Tonny Bollman. [facility] Pager [phone] If no response call [phone]     01/06/2015, 8:35 AM

## 2015-01-06 NOTE — Progress Notes (Signed)
eLink Physician-Brief Progress Note Patient Name: [name] DOB: 1951/08/02 MRN: [id number]   Date of Service  01/06/2015  HPI/Events of Note  Slight drop MAP with slight drop urine, Na up  eICU Interventions  bolus     Intervention Category Intermediate Interventions: Hypotension - evaluation and management  Nelda BucksFEINSTEIN,Dannetta Lekas J. 01/06/2015, 2:43 AM

## 2015-01-07 ENCOUNTER — Encounter (HOSPITAL_COMMUNITY): Payer: Self-pay | Admitting: Anesthesiology

## 2015-01-07 ENCOUNTER — Inpatient Hospital Stay (HOSPITAL_COMMUNITY): Payer: Worker's Compensation

## 2015-01-07 DIAGNOSIS — N179 Acute kidney failure, unspecified: Secondary | ICD-10-CM

## 2015-01-07 LAB — GLUCOSE, CAPILLARY
GLUCOSE-CAPILLARY: 158 mg/dL — AB (ref 70–99)
GLUCOSE-CAPILLARY: 173 mg/dL — AB (ref 70–99)
Glucose-Capillary: 183 mg/dL — ABNORMAL HIGH (ref 70–99)
Glucose-Capillary: 153 mg/dL — ABNORMAL HIGH (ref 70–99)
Glucose-Capillary: 185 mg/dL — ABNORMAL HIGH (ref 70–99)

## 2015-01-07 LAB — CBC
HCT: 48 % (ref 39.0–52.0)
HEMOGLOBIN: 14.3 g/dL (ref 13.0–17.0)
MCH: 30.8 pg (ref 26.0–34.0)
MCHC: 29.8 g/dL — ABNORMAL LOW (ref 30.0–36.0)
MCV: 103.4 fL — ABNORMAL HIGH (ref 78.0–100.0)
PLATELETS: 116 10*3/uL — AB (ref 150–400)
RBC: 4.64 MIL/uL (ref 4.22–5.81)
RDW: 16.9 % — AB (ref 11.5–15.5)
WBC: 18 10*3/uL — ABNORMAL HIGH (ref 4.0–10.5)

## 2015-01-07 LAB — BASIC METABOLIC PANEL
ANION GAP: 10 (ref 5–15)
BUN: 129 mg/dL — ABNORMAL HIGH (ref 6–20)
CO2: 30 mmol/L (ref 22–32)
Calcium: 8.8 mg/dL — ABNORMAL LOW (ref 8.9–10.3)
Chloride: 106 mmol/L (ref 101–111)
Creatinine, Ser: 3.41 mg/dL — ABNORMAL HIGH (ref 0.61–1.24)
GFR calc non Af Amer: 18 mL/min — ABNORMAL LOW (ref >60)
GFR, EST AFRICAN AMERICAN: 21 mL/min — AB (ref >60)
GLUCOSE: 202 mg/dL — AB (ref 70–99)
Potassium: 3.9 mmol/L (ref 3.5–5.1)
Sodium: 146 mmol/L — ABNORMAL HIGH (ref 135–145)

## 2015-01-07 LAB — TROPONIN I: Troponin I: 0.36 ng/mL — ABNORMAL HIGH (ref <0.031)

## 2015-01-07 MED ORDER — MIDAZOLAM HCL 2 MG/2ML IJ SOLN
1.0000 mg | Freq: Once | INTRAMUSCULAR | Status: AC
  Administered 2015-01-07: 1 mg via INTRAVENOUS

## 2015-01-07 MED ORDER — FENTANYL CITRATE (PF) 100 MCG/2ML IJ SOLN
INTRAMUSCULAR | Status: AC
  Administered 2015-01-07: 100 ug via INTRAVENOUS
  Filled 2015-01-07: qty 2

## 2015-01-07 MED ORDER — MIDAZOLAM HCL 2 MG/2ML IJ SOLN
INTRAMUSCULAR | Status: AC
  Administered 2015-01-07: 1 mg via INTRAVENOUS
  Filled 2015-01-07: qty 2

## 2015-01-07 MED ORDER — FENTANYL CITRATE (PF) 100 MCG/2ML IJ SOLN
100.0000 ug | Freq: Once | INTRAMUSCULAR | Status: AC
  Administered 2015-01-07: 100 ug via INTRAVENOUS

## 2015-01-07 NOTE — Consult Note (Signed)
[place], [place] 64 y.o., male [id number]     Chief Complaint: prolonged intubation  HPI: 64 yo wm, long hx obesity.  Underwent elective RIGHT shoulder repair 22 APR.  Respiratory compromise on 23 APR.  Hypercarbic.  Failing to wean over 9 days.  ENT called for assistance with tracheostomy.    PMH: Past Medical History  Diagnosis Date  . Arthritis   . RCT (rotator cuff tear)   . Multiple abrasions     rt arm  . Morbid obesity     Surg Hx: Past Surgical History  Procedure Laterality Date  . No past surgeries    . Shoulder arthroscopy with subacromial decompression, rotator cuff repair and bicep tendon repair Right 12/28/2014    Procedure: RIGHT SHOULDER ARTHROSCOPY WITH SUBACROMIAL DECOMPRESSION, DISTAL CLAVICLE RESECTION, ROTATOR CUFF REPAIR ;  Surgeon: Sydnee Cabal, MD;  Location: [facility];  Service: Orthopedics;  Laterality: Right;    FHx:   Family History  Problem Relation Age of Onset  . Heart disease Mother   . Heart attack Mother   . Heart disease Brother    SocHx:  reports that he has been smoking.  He does not have any smokeless tobacco history on file. He reports that he does not drink alcohol or use illicit drugs.  ALLERGIES: No Known Allergies  Medications Prior to Admission  Medication Sig Dispense Refill  . HYDROcodone-acetaminophen (NORCO/VICODIN) 5-325 MG per tablet Take 1 tablet by mouth at bedtime.  0  . ibuprofen (ADVIL,MOTRIN) 200 MG tablet Take 200 mg by mouth every 6 (six) hours as needed.      Results for orders placed or performed during the hospital encounter of 12/28/14 (from the past 48 hour(s))  Glucose, capillary     Status: Abnormal   Collection Time: 01/05/15  3:28 PM  Result Value Ref Range   Glucose-Capillary 216 (H) 70 - 99 mg/dL  Glucose, capillary     Status: Abnormal   Collection Time: 01/05/15  7:49 PM  Result Value Ref Range   Glucose-Capillary 184 (H) 70 - 99 mg/dL  Glucose, capillary     Status: Abnormal   Collection Time:  01/06/15 12:01 AM  Result Value Ref Range   Glucose-Capillary 192 (H) 70 - 99 mg/dL  Glucose, capillary     Status: Abnormal   Collection Time: 01/06/15  3:44 AM  Result Value Ref Range   Glucose-Capillary 194 (H) 70 - 99 mg/dL  BMET in AM     Status: Abnormal   Collection Time: 01/06/15  4:30 AM  Result Value Ref Range   Sodium 154 (H) 135 - 145 mmol/L   Potassium 3.8 3.5 - 5.1 mmol/L   Chloride 113 (H) 101 - 111 mmol/L   CO2 33 (H) 22 - 32 mmol/L   Glucose, Bld 196 (H) 70 - 99 mg/dL   BUN 91 (H) 6 - 20 mg/dL   Creatinine, Ser 3.42 (H) 0.61 - 1.24 mg/dL    Comment: DELTA CHECK NOTED REPEATED TO VERIFY    Calcium 9.3 8.9 - 10.3 mg/dL   GFR calc non Af Amer 18 (L) >60 mL/min   GFR calc Af Amer 21 (L) >60 mL/min    Comment: (NOTE) The eGFR has been calculated using the CKD EPI equation. This calculation has not been validated in all clinical situations. eGFR's persistently <90 mL/min signify possible Chronic Kidney Disease.    Anion gap 8 5 - 15  Lactic acid, plasma     Status: None   Collection  Time: 01/06/15  4:30 AM  Result Value Ref Range   Lactic Acid, Venous 1.8 0.5 - 2.0 mmol/L  Blood gas, arterial     Status: Abnormal   Collection Time: 01/06/15  4:40 AM  Result Value Ref Range   FIO2 0.70 %   Delivery systems VENTILATOR    Mode PRESSURE REGULATED VOLUME CONTROL    VT 600 mL   Rate 14 resp/min   Peep/cpap 10.0 cm H20   pH, Arterial 7.434 7.350 - 7.450   pCO2 arterial 49.3 (H) 35.0 - 45.0 mmHg   pO2, Arterial 95.8 80.0 - 100.0 mmHg   Bicarbonate 32.4 (H) 20.0 - 24.0 mEq/L   TCO2 27.5 0 - 100 mmol/L   Acid-Base Excess 7.1 (H) 0.0 - 2.0 mmol/L   O2 Saturation 97.2 %   Patient temperature 37.0    Collection site LEFT RADIAL    Drawn by [id number]    Sample type ARTERIAL DRAW    Allens test (pass/fail) PASS PASS  CBC     Status: Abnormal   Collection Time: 01/06/15  5:00 AM  Result Value Ref Range   WBC 15.0 (H) 4.0 - 10.5 K/uL   RBC 5.03 4.22 - 5.81 MIL/uL    Hemoglobin 15.9 13.0 - 17.0 g/dL   HCT 52.7 (H) 39.0 - 52.0 %   MCV 104.8 (H) 78.0 - 100.0 fL   MCH 31.6 26.0 - 34.0 pg   MCHC 30.2 30.0 - 36.0 g/dL   RDW 16.8 (H) 11.5 - 15.5 %   Platelets 121 (L) 150 - 400 K/uL  Lactic acid, plasma     Status: None   Collection Time: 01/06/15  7:38 AM  Result Value Ref Range   Lactic Acid, Venous 1.8 0.5 - 2.0 mmol/L  Troponin I (q 6hr x 3)     Status: Abnormal   Collection Time: 01/06/15  7:38 AM  Result Value Ref Range   Troponin I 0.48 (H) <0.031 ng/mL    Comment:        PERSISTENTLY INCREASED TROPONIN VALUES IN THE RANGE OF 0.04-0.49 ng/mL CAN BE SEEN IN:       -UNSTABLE ANGINA       -CONGESTIVE HEART FAILURE       -MYOCARDITIS       -CHEST TRAUMA       -ARRYHTHMIAS       -LATE PRESENTING MYOCARDIAL INFARCTION       -COPD   CLINICAL FOLLOW-UP RECOMMENDED.   Glucose, capillary     Status: Abnormal   Collection Time: 01/06/15  8:12 AM  Result Value Ref Range   Glucose-Capillary 192 (H) 70 - 99 mg/dL   Comment 1 Notify RN    Comment 2 Document in Chart   Glucose, capillary     Status: Abnormal   Collection Time: 01/06/15 12:30 PM  Result Value Ref Range   Glucose-Capillary 167 (H) 70 - 99 mg/dL  Troponin I (q 6hr x 3)     Status: Abnormal   Collection Time: 01/06/15  1:46 PM  Result Value Ref Range   Troponin I 0.39 (H) <0.031 ng/mL    Comment:        PERSISTENTLY INCREASED TROPONIN VALUES IN THE RANGE OF 0.04-0.49 ng/mL CAN BE SEEN IN:       -UNSTABLE ANGINA       -CONGESTIVE HEART FAILURE       -MYOCARDITIS       -CHEST TRAUMA       -ARRYHTHMIAS       -  LATE PRESENTING MYOCARDIAL INFARCTION       -COPD   CLINICAL FOLLOW-UP RECOMMENDED.   Magnesium     Status: Abnormal   Collection Time: 01/06/15  3:04 PM  Result Value Ref Range   Magnesium 2.8 (H) 1.7 - 2.4 mg/dL  Basic metabolic panel     Status: Abnormal   Collection Time: 01/06/15  3:04 PM  Result Value Ref Range   Sodium 152 (H) 135 - 145 mmol/L   Potassium 3.7  3.5 - 5.1 mmol/L   Chloride 111 101 - 111 mmol/L   CO2 30 22 - 32 mmol/L   Glucose, Bld 223 (H) 70 - 99 mg/dL   BUN 96 (H) 6 - 20 mg/dL   Creatinine, Ser 3.28 (H) 0.61 - 1.24 mg/dL   Calcium 9.1 8.9 - 10.3 mg/dL   GFR calc non Af Amer 19 (L) >60 mL/min   GFR calc Af Amer 22 (L) >60 mL/min    Comment: (NOTE) The eGFR has been calculated using the CKD EPI equation. This calculation has not been validated in all clinical situations. eGFR's persistently <90 mL/min signify possible Chronic Kidney Disease.    Anion gap 11 5 - 15  Glucose, capillary     Status: Abnormal   Collection Time: 01/06/15  4:56 PM  Result Value Ref Range   Glucose-Capillary 217 (H) 70 - 99 mg/dL   Comment 1 Notify RN    Comment 2 Document in Chart   Vancomycin, trough     Status: Abnormal   Collection Time: 01/06/15  5:08 PM  Result Value Ref Range   Vancomycin Tr 43 (HH) 10.0 - 20.0 ug/mL    Comment: REPEATED TO VERIFY CRITICAL RESULT CALLED TO, READ BACK BY AND VERIFIED WITH: M SHIFLETT RN AT 2018 ON 05.01.16 BY G KONTOS   Troponin I (q 6hr x 3)     Status: Abnormal   Collection Time: 01/06/15  8:10 PM  Result Value Ref Range   Troponin I 0.36 (H) <0.031 ng/mL    Comment:        PERSISTENTLY INCREASED TROPONIN VALUES IN THE RANGE OF 0.04-0.49 ng/mL CAN BE SEEN IN:       -UNSTABLE ANGINA       -CONGESTIVE HEART FAILURE       -MYOCARDITIS       -CHEST TRAUMA       -ARRYHTHMIAS       -LATE PRESENTING MYOCARDIAL INFARCTION       -COPD   CLINICAL FOLLOW-UP RECOMMENDED.   Glucose, capillary     Status: Abnormal   Collection Time: 01/06/15  8:53 PM  Result Value Ref Range   Glucose-Capillary 215 (H) 70 - 99 mg/dL  Glucose, capillary     Status: Abnormal   Collection Time: 01/06/15 11:46 PM  Result Value Ref Range   Glucose-Capillary 213 (H) 70 - 99 mg/dL  Glucose, capillary     Status: Abnormal   Collection Time: 01/07/15  3:53 AM  Result Value Ref Range   Glucose-Capillary 183 (H) 70 - 99 mg/dL   Basic metabolic panel     Status: Abnormal   Collection Time: 01/07/15  4:10 AM  Result Value Ref Range   Sodium 146 (H) 135 - 145 mmol/L   Potassium 3.9 3.5 - 5.1 mmol/L   Chloride 106 101 - 111 mmol/L   CO2 30 22 - 32 mmol/L   Glucose, Bld 202 (H) 70 - 99 mg/dL   BUN 129 (H) 6 - 20 mg/dL  Comment: RESULTS CONFIRMED BY MANUAL DILUTION   Creatinine, Ser 3.41 (H) 0.61 - 1.24 mg/dL   Calcium 8.8 (L) 8.9 - 10.3 mg/dL   GFR calc non Af Amer 18 (L) >60 mL/min   GFR calc Af Amer 21 (L) >60 mL/min    Comment: (NOTE) The eGFR has been calculated using the CKD EPI equation. This calculation has not been validated in all clinical situations. eGFR's persistently <90 mL/min signify possible Chronic Kidney Disease.    Anion gap 10 5 - 15  CBC     Status: Abnormal   Collection Time: 01/07/15  4:10 AM  Result Value Ref Range   WBC 18.0 (H) 4.0 - 10.5 K/uL   RBC 4.64 4.22 - 5.81 MIL/uL   Hemoglobin 14.3 13.0 - 17.0 g/dL   HCT 48.0 39.0 - 52.0 %   MCV 103.4 (H) 78.0 - 100.0 fL   MCH 30.8 26.0 - 34.0 pg   MCHC 29.8 (L) 30.0 - 36.0 g/dL   RDW 16.9 (H) 11.5 - 15.5 %   Platelets 116 (L) 150 - 400 K/uL    Comment: CONSISTENT WITH PREVIOUS RESULT  Glucose, capillary     Status: Abnormal   Collection Time: 01/07/15  7:27 AM  Result Value Ref Range   Glucose-Capillary 158 (H) 70 - 99 mg/dL   Comment 1 Notify RN    Comment 2 Document in Chart   Glucose, capillary     Status: Abnormal   Collection Time: 01/07/15 11:29 AM  Result Value Ref Range   Glucose-Capillary 153 (H) 70 - 99 mg/dL   Comment 1 Notify RN    Comment 2 Document in Chart    Dg Chest Port 1 View  01/07/2015   CLINICAL DATA:  Acute respiratory failure  EXAM: PORTABLE CHEST - 1 VIEW  COMPARISON:  01/06/2015  FINDINGS: The endotracheal tube tip is 4.9 cm above the carina. The left subclavian central line extends into the SVC. The nasogastric tube extends into the stomach and off the inferior edge of the image.  There is no  interval change in left base consolidation. Diffuse interstitial edema also persists without significant change.  IMPRESSION: Support equipment appears satisfactorily positioned.  No significant interval change in the bilateral interstitial and airspace opacities.   Electronically Signed   By: Andreas Newport M.D.   On: 01/07/2015 06:01   Dg Chest Port 1 View  01/06/2015   CLINICAL DATA:  Acute respiratory failure. On ventilator. Morbid obesity.  EXAM: PORTABLE CHEST - 1 VIEW  COMPARISON:  01/05/2015  FINDINGS: Support lines and tubes in appropriate position. No pneumothorax visualized. Low lung volumes are again demonstrated. Diffuse interstitial infiltrates are again seen, suspicious for interstitial edema. Cardiomegaly remains stable.  IMPRESSION: No significant interval change in cardiomegaly and diffuse interstitial edema pattern.   Electronically Signed   By: Earle Gell M.D.   On: 01/06/2015 07:19     Blood pressure 104/55, pulse 76, temperature 100.1 F (37.8 C), temperature source Oral, resp. rate 20, height 5' 11" (1.803 m), weight 151.5 kg (334 lb), SpO2 99 %.  BMI 46.7.  PHYSICAL EXAM: Overall appearance:  Intubated, obtunded.  Minimally responsive.  RIGHT arm in sling.  Very large abdomen.  Very large head. Head:  NCAT Ears:  Not examined Nose:  Not examined Oral Cavity:  Not examined Oral Pharynx/Hypopharynx/Larynx:  Not examined Neuro:  Not examined Neck:  Muscular but relatively normal anatomy under overhanging chin.  No masses.  No thyromegaly.  Assessment/Plan Prolonged intubation.  Morbid obesity.  Probably obesity hypoventilation and obstructive sleep apnea.  Agree with indications for tracheostomy.  Will discuss with brother as desired.  Would likely benefit from semi-long term tracheostomy for control of OSA while losing weight.    Jodi Marble 02/10/6811, 12:43 PM

## 2015-01-07 NOTE — Procedures (Signed)
Bronchoscopy Procedure Note Marjory LiesJames Cataldo [id number] [birth year]  Procedure: Bronchoscopy Indications: Obtain specimens for culture and/or other diagnostic studies and Remove secretions  Procedure Details Consent: Risks of procedure as well as the alternatives and risks of each were explained to the (patient/caregiver).  Consent for procedure obtained. Time Out: Verified patient identification, verified procedure, site/side was marked, verified correct patient position, special equipment/implants available, medications/allergies/relevent history reviewed, required imaging and test results available.  Performed  In preparation for procedure, patient was given 100% FiO2 and bronchoscope lubricated. Sedation: Benzodiazepines  Airway entered and the following bronchi were examined: Bronchi.  Thick pus suctioned from LLL Procedures performed: BAL performed LLL Bronchoscope removed.  , Patient placed back on 100% FiO2 at conclusion of procedure.    Evaluation Hemodynamic Status: BP stable throughout; O2 sats: transiently fell during during procedure Patient's [place] Condition: stable Specimens:  Sent purulent fluid Complications: No apparent complications Patient did tolerate procedure well.   ALVA,RAKESH V. 01/07/2015

## 2015-01-07 NOTE — Progress Notes (Signed)
NUTRITION FOLLOW-UP  INTERVENTION: -Continue Vital HP @ 40 mL/hr via OGT. -Prostat to 60 ml Prostat QID.    Tube feeding regimen 1760 kcal (11.6 kcal/kg actual body weight), 196 grams of protein, and 806 ml of H2O.   RD to continue to monitor  NUTRITION DIAGNOSIS: Inadequate oral intake related to inability to eat as evidenced by NPO status, ongoing.  Goal: Enteral nutrition to provide 65-70% of estimated calorie needs based on ASPEN guidelines for hypocaloric, high protein feeding in critically ill obese individuals  Monitor:  TF regimen & tolerance, respiratory status, weight, labs, I/O's  Admitting Dx: Acute encephalopathy  ASSESSMENT: 64 y/o with h/o HTN who was admitted to step down after difficulty extubating after rotator cuff repair. Has a 44 pack year smoking hx but no formal COPD diagnosis. 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  S/p 4/22 Procedure(s) (LRB): RIGHT SHOULDER ARTHROSCOPY WITH SUBACROMIAL DECOMPRESSION, DISTAL CLAVICLE RESECTION, ROTATOR CUFF REPAIR (Right)  - Per RN, pt tolerating TF at goal rate with minimal residuals.  -RD re-estimated patient's protein needs d/t weight changes  Patient is currently intubated on ventilator support MV: 8.7 L/min Temp (24hrs), Avg:101.3 F (38.5 C), Min:100.1 F (37.8 C), Max:102.7 F (39.3 C)  Propofol: none  Labs reviewed: Elevated BUN & Creatinine CBGs: 153-183  Height: Ht Readings from Last 1 Encounters:  01/07/15 5\' 11"  (1.803 m)    Weight: Wt Readings from Last 1 Encounters:  01/07/15 334 lb (151.5 kg)  01/02/15 339 lb 12/28/14 381 lb  Ideal Body Weight: 172 lb  % Ideal Body Weight: 194%  Wt Readings from Last 10 Encounters:  01/07/15 334 lb (151.5 kg)  11/29/14 360 lb 3.2 oz (163.386 kg)   BMI:  Body mass index is 46.6 kg/(m^2).  Re-estimated Nutritional Needs: Kcal:  2737             1667- 2121 (11-14 kcal/kg actual body weight) Protein: >157 g Fluid: Per MD  Skin: shoulder incision, scrotum wound  Diet Order: Diet - low sodium heart healthy  EDUCATION NEEDS: -No education needs identified at this time   Intake/Output Summary (Last 24 hours) at 01/07/15 1444 Last data filed at 01/07/15 1400  Gross per 24 hour  Intake 3860.79 ml  Output   1090 ml  Net 2770.79 ml    Last BM: 4/26  Labs:   Recent Labs Lab 01/01/15 1809  01/04/15 0350  01/06/15 0430 01/06/15 1504 01/07/15 0410  NA 142  < >  --   < > 154* 152* 146*  K 4.1  < >  --   < > 3.8 3.7 3.9  CL 93*  < >  --   < > 113* 111 106  CO2 37*  < >  --   < > 33* 30 30  BUN 24*  < >  --   < > 91* 96* 129*  CREATININE 0.67  < >  --   < > 3.42* 3.28* 3.41*  CALCIUM 9.3  < >  --   < > 9.3 9.1 8.8*  MG 2.0  --  2.4  --   --  2.8*  --   PHOS  --   --  2.6  --   --   --   --   GLUCOSE 177*  < >  --   < > 196* 223* 202*  < > =  values in this interval not displayed.  CBG (last 3)   Recent Labs  01/07/15 0353 01/07/15 0727 01/07/15 1129  GLUCAP 183* 158* 153*    Scheduled Meds: . alteplase  2 mg Intracatheter Once  . antiseptic oral rinse  7 mL Mouth Rinse QID  . aspirin  325 mg Oral Daily  . budesonide  0.5 mg Nebulization BID  . ceFEPime (MAXIPIME) IV  2 g Intravenous Q24H  . chlorhexidine  15 mL Mouth Rinse BID  . famotidine  20 mg Per Tube BID  . feeding supplement (PRO-STAT SUGAR FREE 64)  60 mL Oral QID  . feeding supplement (VITAL HIGH PROTEIN)  1,000 mL Per Tube Q24H  . folic acid  1 mg Per Tube Daily  . free water  200 mL Per Tube 3 times per day  . heparin subcutaneous  5,000 Units Subcutaneous 3 times per day  . insulin aspart  0-15 Units Subcutaneous 6 times per day  . insulin glargine  10 Units Subcutaneous BID  . ipratropium-albuterol  3 mL Nebulization Q6H  . sodium chloride  10-40 mL Intracatheter Q12H    Continuous Infusions: . sodium chloride 10 mL/hr at  01/03/15 0223  . fentaNYL infusion INTRAVENOUS 50 mcg/hr (01/07/15 1400)  . phenylephrine (NEO-SYNEPHRINE) Adult infusion 120 mcg/min (01/07/15 1400)    Tilda Franco, MS, RD, LDN Pager: [phone] After Hours Pager: [phone]

## 2015-01-07 NOTE — Progress Notes (Signed)
PULMONARY / CRITICAL CARE MEDICINE   Name: [name] MRN: [id number] DOB: [birth year]    ADMISSION DATE:  12/28/2014 CONSULTATION DATE:  12/28/14   REFERRING MD :  [name]  PT PROFILE:  51 morbidly obese male admitted 4/22 by Ortho for elective R rotator cuff tear. Intubated early 4/23 AM for post operative hypercarbic/hypoxic resp failure.  STUDIES:  4/26  ECHO >> nml LV, EF 60-65%, Grade 1 DD, trivial pericardial effusion without evidence of tamponade 4/27 [place] testes with masses   MAJOR EVENTS/TEST RESULTS: 4/22  elective R shoulder surgery 4/23  reintubated for hypercarbic/hypoxic resp failure 4/23  US scrotum: Two solid extratesticular right scrotal masses. Primary differential diagnosis for extratesticular masses include primary or metastatic neoplasms including adenomatoid tumor, fibroma, leiomyoma, mesothelioma, or sarcoma. Urology consultation is recommended  4/24  MV support, 8 PEEP, 80%.  Sedate.  4/26  MV support, PEEP 8, 60% 4/27  PEEP 8, 50%.  Tolerating TF, residuals improved. Net neg 2L 4/30 PEEP 10, 70%   INDWELLING DEVICES:: ETT 4/23 >>  L radial A-line 4/23 >> 4/25 PICC LUE 4/23 >>   MICRO DATA: PCT 4/23: 0.69, 4/24: 0.44, 4/25: 0.46 Urine 4/23 >> neg Resp 4/23 >> neg  4/27 bc>>ngtd>> 4/27 uc>>neg 4/27 sputum>>ngtd>>    ANTIMICROBIALS:  Cefazolin 4/22 >> 4/23 Levofloxacin 4/23 >> 4/27 4/27 vanc>> 4/27 cefepime>>   SUBJECTIVE:   Remains obtunded off sedation Poor UO off lasix Remains febrile  VITAL SIGNS: Temp:  [100.1 F (37.8 C)-102.7 F (39.3 C)] 100.1 F (37.8 C) (05/02 0800) Pulse Rate:  [69-230] 77 (05/02 0900) Resp:  [0-27] 20 (05/02 0900) BP: (74-128)/(40-74) 93/54 mmHg (05/02 0900) SpO2:  [88 %-100 %] 96 % (05/02 0900) FiO2 (%):  [60 %-70 %] 60 % (05/02 0812)   HEMODYNAMICS: CVP:  [12 mmHg-23 mmHg] 15 mmHg   VENTILATOR SETTINGS: Vent Mode:  [-] PRVC FiO2 (%):  [60 %-70 %] 60 % Set Rate:  [14 bmp] 14 bmp Vt Set:   [600 mL] 600 mL PEEP:  [12 cmH20] 12 cmH20 Plateau Pressure:  [19 cmH20-26 cmH20] 23 cmH20   INTAKE / OUTPUT:  Intake/Output Summary (Last 24 hours) at 01/07/15 0936 Last data filed at 01/07/15 0800  Gross per 24 hour  Intake 4176.54 ml  Output   1180 ml  Net 2996.54 ml    PHYSICAL EXAMINATION: General: Morbidly obese male on vent in NAD Neuro: Sedate, does not follow commands  HEENT: NCAT, very short, thick neck Cardiovascular: Reg, no M Lungs: even/non-labored, lungs bilaterally distant but clear Abdomen:  Obese, +BS, soft Ext: R shoulder surgical dressing, no edema, sling in place  LABS:  CBC  Recent Labs Lab 01/05/15 0419 01/06/15 0500 01/07/15 0410  WBC 12.2* 15.0* 18.0*  HGB 16.6 15.9 14.3  HCT 54.8* 52.7* 48.0  PLT 165 121* 116*   Coag's No results for input(s): APTT, INR in the last 168 hours.   BMET  Recent Labs Lab 01/06/15 0430 01/06/15 1504 01/07/15 0410  NA 154* 152* 146*  K 3.8 3.7 3.9  CL 113* 111 106  CO2 33* 30 30  BUN 91* 96* 129*  CREATININE 3.42* 3.28* 3.41*  GLUCOSE 196* 223* 202*   Electrolytes  Recent Labs Lab 01/01/15 1809  01/04/15 0350  01/06/15 0430 01/06/15 1504 01/07/15 0410  CALCIUM 9.3  < >  --   < > 9.3 9.1 8.8*  MG 2.0  --  2.4  --   --  2.8*  --   PHOS  --   --  2.6  --   --   --   --   < > = values in this interval not displayed. Sepsis Markers  Recent Labs Lab 01/06/15 0430 01/06/15 0738  LATICACIDVEN 1.8 1.8   ABG  Recent Labs Lab 01/04/15 0406 01/05/15 0500 01/06/15 0440  PHART 7.402 7.386 7.434  PCO2ART 62.6* 64.7* 49.3*  PO2ART 87.3 81.4 95.8   Liver Enzymes  Recent Labs Lab 01/02/15 0430  AST 45*  ALT 29  ALKPHOS 80  BILITOT 1.9*  ALBUMIN 3.3*   Cardiac Enzymes  Recent Labs Lab 01/06/15 0738 01/06/15 1346 01/06/15 2010  TROPONINI 0.48* 0.39* 0.36*   Glucose  Recent Labs Lab 01/06/15 1230 01/06/15 1656 01/06/15 2053 01/06/15 2346 01/07/15 0353 01/07/15 0727   GLUCAP 167* 217* 215* 213* 183* 158*    CXR: 4/30 - cardiomegaly, lines in good position, CHF with interstitial edema vs pneumonia    ASSESSMENT / PLAN:  PULMONARY A:  Acute Hypercarbic / Hypoxemic Resp Failure RLL ATX - improved, LL atx 4/30 Pulmonary Edema - suspect in setting of dCHF 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 (Hgb 18, serum CO2 32) Likely OHS/OSA Anticipate difficult airway due to body habitus COPD  P:   Cont MV support, 8 cc/kg Increase PEEP/ wean FiO2 for sats > 90% per ARDS protocol Hold off SBT  While FIO2 requirement high Pulmicort Q12, duonebs Call surgery trach -brother agreeable. Chest PT, proceed with  bronchoscopy  CARDIOVASCULAR A:  Hypotension - resolved. Minimal elevation of trop I - doubt ACS Hx HTN Hypertriglyceridemia  Diastolic Dysfunction - grade 1 on ECHO 4/26 VT - episode x 2 4/26 P:  Monitor BP and rhythm PRN hydralazine  ASA 325 QD Monitor rhythm  HOLD ACE, hold metoprolol until BP picks up  RENAL/GU A:  Hypernatremia , AKI 4/30  Scrotal mass P:   Monitor BMET intermittently Monitor I/Os Renal US Correct electrolytes as indicated Urology consultation after acute critical phase of illness resolves for scrotal mass Free water 100 Q8 Hold lasix, appears overdiuresed  GASTROINTESTINAL A:  Obesity Vent Associated Dysphagia - 4/28 high residuals for TF Off reglan due to VT episodes  P:   SUP: enteral famotidine Cont TFs Monitor residuals   HEMATOLOGIC A:   Polycythemia - likely chronic hypoxemia Macrocytosis  P:  DVT px: SQ heparin Monitor CBC intermittently Transfuse per usual ICU guidelines Folate  INFECTIOUS A:   HCAP   increased temp 4/27 Hcap coverage started 4/27 P:  4/27 bc>>ngtd 4/27 uc>>ng 4/27 sputum>>ngtd 4/27 vanc>>5/2 4/27 cefepime>>  ENDOCRINE CBG (last 3)   Recent Labs  01/06/15 2346 01/07/15 0353 01/07/15 0727  GLUCAP 213* 183* 158*     A:    Hyperglycemia, controlled   P:   SSI  Q4 CBG 4/29 added  lantus   NEUROLOGIC A:  Acute Hypercarbic Encephalopathy Post op pain s/p R rotator cuff surgery ICU associated discomfort P:   RASS goal: -1 Off propofol , OK to use  fentanyl gtt   post-op care per Ortho  Daily WUA    GLOBAL: updated brother 5/2 am  Summary  - suspect HCAP with LLL atelectasis, rather than pulmonary edema -back off diuretics  Summary -Plan for bronchoscopy today & call ENT for tracheostomy  The patient is critically ill with multiple organ systems failure and requires high complexity decision making for assessment and support, frequent evaluation and titration of therapies, application of advanced monitoring technologies and extensive interpretation of multiple databases. Critical Care Time devoted to  patient care services described in this note independent of APP time is 35 minutes.   Cyril Mourning MD. Tonny Bollman. [facility] Pager [phone] If no response call [phone]     01/07/2015, 9:36 AM

## 2015-01-07 NOTE — Progress Notes (Signed)
Found Pt ETT at 20cm.  Advanced to 26.  Pt tolerated well.

## 2015-01-08 ENCOUNTER — Inpatient Hospital Stay (HOSPITAL_COMMUNITY): Payer: Worker's Compensation

## 2015-01-08 LAB — GLUCOSE, CAPILLARY
GLUCOSE-CAPILLARY: 198 mg/dL — AB (ref 70–99)
GLUCOSE-CAPILLARY: 199 mg/dL — AB (ref 70–99)
Glucose-Capillary: 183 mg/dL — ABNORMAL HIGH (ref 70–99)
Glucose-Capillary: 173 mg/dL — ABNORMAL HIGH (ref 70–99)
Glucose-Capillary: 170 mg/dL — ABNORMAL HIGH (ref 70–99)
Glucose-Capillary: 175 mg/dL — ABNORMAL HIGH (ref 70–99)

## 2015-01-08 LAB — BASIC METABOLIC PANEL
Anion gap: 12 (ref 5–15)
BUN: 165 mg/dL — ABNORMAL HIGH (ref 6–20)
CHLORIDE: 103 mmol/L (ref 101–111)
CO2: 28 mmol/L (ref 22–32)
CREATININE: 4.1 mg/dL — AB (ref 0.61–1.24)
Calcium: 8.6 mg/dL — ABNORMAL LOW (ref 8.9–10.3)
GFR calc Af Amer: 17 mL/min — ABNORMAL LOW (ref >60)
GFR calc non Af Amer: 14 mL/min — ABNORMAL LOW (ref >60)
Glucose, Bld: 193 mg/dL — ABNORMAL HIGH (ref 70–99)
Potassium: 4 mmol/L (ref 3.5–5.1)
Sodium: 143 mmol/L (ref 135–145)

## 2015-01-08 LAB — URINALYSIS, ROUTINE W REFLEX MICROSCOPIC
BILIRUBIN URINE: NEGATIVE
GLUCOSE, UA: NEGATIVE mg/dL
Ketones, ur: NEGATIVE mg/dL
Nitrite: NEGATIVE
Protein, ur: 30 mg/dL — AB
Specific Gravity, Urine: 1.02 (ref 1.005–1.030)
Urobilinogen, UA: 0.2 mg/dL (ref 0.0–1.0)
pH: 5 (ref 5.0–8.0)

## 2015-01-08 LAB — CBC
HCT: 43.4 % (ref 39.0–52.0)
Hemoglobin: 13.4 g/dL (ref 13.0–17.0)
MCH: 31.7 pg (ref 26.0–34.0)
MCHC: 30.9 g/dL (ref 30.0–36.0)
MCV: 102.6 fL — AB (ref 78.0–100.0)
PLATELETS: 120 10*3/uL — AB (ref 150–400)
RBC: 4.23 MIL/uL (ref 4.22–5.81)
RDW: 16.8 % — ABNORMAL HIGH (ref 11.5–15.5)
WBC: 15.6 10*3/uL — ABNORMAL HIGH (ref 4.0–10.5)

## 2015-01-08 LAB — URINE MICROSCOPIC-ADD ON

## 2015-01-08 LAB — SODIUM, URINE, RANDOM: Sodium, Ur: 30 mmol/L

## 2015-01-08 LAB — CREATININE, URINE, RANDOM: Creatinine, Urine: 230.21 mg/dL

## 2015-01-08 NOTE — Progress Notes (Signed)
Subjective: 11 Days Post-Op Procedure(s) (LRB): RIGHT SHOULDER ARTHROSCOPY WITH SUBACROMIAL DECOMPRESSION, DISTAL CLAVICLE RESECTION, ROTATOR CUFF REPAIR  (Right) Patient lying in bed on Vent, due to respiratory failure. Approximately 11 day from RCR surgery. Doing well from Ortho standpoint.   Objective: Vital signs in last 24 hours: Temp:  [99 F (37.2 C)-100.6 F (38.1 C)] 99.2 F (37.3 C) (05/03 1200) Pulse Rate:  [61-88] 80 (05/03 1100) Resp:  [15-27] 16 (05/03 1100) BP: (73-122)/(33-73) 100/51 mmHg (05/03 1100) SpO2:  [93 %-100 %] 93 % (05/03 1100) FiO2 (%):  [60 %] 60 % (05/03 0749) Weight:  [158.305 kg (349 lb)] 158.305 kg (349 lb) (05/03 0408)  Intake/Output from previous day: 05/02 0701 - 05/03 0700 In: 2367.2 [I.V.:1347.2; NG/GT:970; IV Piggyback:50] Out: 925 [Urine:925] Intake/Output this shift: Total I/O In: 514.5 [I.V.:274.5; NG/GT:190; IV Piggyback:50] Out: 100 [Urine:100]   Recent Labs  01/06/15 0500 01/07/15 0410 01/08/15 0350  HGB 15.9 14.3 13.4    Recent Labs  01/07/15 0410 01/08/15 0350  WBC 18.0* 15.6*  RBC 4.64 4.23  HCT 48.0 43.4  PLT 116* 120*    Recent Labs  01/07/15 0410 01/08/15 0350  NA 146* 143  K 3.9 4.0  CL 106 103  CO2 30 28  BUN 129* 165*  CREATININE 3.41* 4.10*  GLUCOSE 202* 193*  CALCIUM 8.8* 8.6*   No results for input(s): LABPT, INR in the last 72 hours.  Pt vented and resting comfortably. Right sling in place.  RUE skin intact no sores or breakdown. 2+ right radial pulse.Good right elbow and wrist ROM.   Assessment/Plan: 11 Days Post-Op Procedure(s) (LRB): RIGHT SHOULDER ARTHROSCOPY WITH SUBACROMIAL DECOMPRESSION, DISTAL CLAVICLE RESECTION, ROTATOR CUFF REPAIR  (Right) OT Eval and treat Remove Sling for bathing and skin care Discussed with RN Continue all other care and we appreciate the care team. We will continue to monitor  STILWELL, BRYSON L 01/08/2015, 12:36 PM

## 2015-01-08 NOTE — Progress Notes (Signed)
Date:  Jan 08, 2015 Update in condition from chart:  Summary  - suspect HCAP with LLL atelectasis,now complicated by AKI Plan for  ENT tracheostomy 5/4 Will continue to follow for Case Management needs.

## 2015-01-08 NOTE — Progress Notes (Signed)
PULMONARY / CRITICAL CARE MEDICINE   Name: [name] MRN: [id number] DOB: 29-Mar-1951    ADMISSION DATE:  12/28/2014 CONSULTATION DATE:  12/28/14   REFERRING MD :  Imogene BurnJenkins/TRH  PT PROFILE:  [id number] morbidly obese male admitted 4/22 by Ortho for elective R rotator cuff tear. Intubated early 4/23 AM for post operative hypercarbic/hypoxic resp failure.  STUDIES:  4/26  ECHO >> nml LV, EF 60-65%, Grade 1 DD, trivial pericardial effusion without evidence of tamponade 4/27 us testes with masses 5/2 renal US >> no hnosis, Possible nonobstructing stone lower pole left kidney  MAJOR EVENTS/TEST RESULTS: 4/22  elective R shoulder surgery 4/23  reintubated for hypercarbic/hypoxic resp failure 4/23  US scrotum: Two solid extratesticular right scrotal masses. Primary differential diagnosis for extratesticular masses include primary or metastatic neoplasms including adenomatoid tumor, fibroma, leiomyoma, mesothelioma, or sarcoma. Urology consultation is recommended  4/24  MV support, 8 PEEP, 80%.  Sedate.  4/26  MV support, PEEP 8, 60% 4/27  PEEP 8, 50%.  Tolerating TF, residuals improved. Net neg 2L 4/30 PEEP 10, 70%   INDWELLING DEVICES:: ETT 4/23 >>  L radial A-line 4/23 >> 4/25 PICC LUE 4/23 >>   MICRO DATA: PCT 4/23: 0.69, 4/24: 0.44, 4/25: 0.46 Urine 4/23 >> neg Resp 4/23 >> neg  4/27 bc>>ngtd>> 4/27 uc>>neg 4/27 sputum>>ngtd>>    ANTIMICROBIALS:  Cefazolin 4/22 >> 4/23 Levofloxacin 4/23 >> 4/27 4/27 vanc>> 4/27 cefepime>>   SUBJECTIVE:   Remains lethargic on low dose fent gtt Poor UO off lasix Low grade febrile  VITAL SIGNS: Temp:  [99 F (37.2 C)-100.6 F (38.1 C)] 99.8 F (37.7 C) (05/03 0800) Pulse Rate:  [61-88] 86 (05/03 0800) Resp:  [11-27] 17 (05/03 0800) BP: (73-128)/(33-73) 103/49 mmHg (05/03 0800) SpO2:  [94 %-100 %] 95 % (05/03 0800) FiO2 (%):  [60 %-100 %] 60 % (05/03 0749) Weight:  [349 lb (158.305 kg)] 349 lb (158.305 kg) (05/03 0408)    HEMODYNAMICS:     VENTILATOR SETTINGS: Vent Mode:  [-] PRVC FiO2 (%):  [60 %-100 %] 60 % Set Rate:  [14 bmp] 14 bmp Vt Set:  [600 mL] 600 mL PEEP:  [12 cmH20] 12 cmH20 Plateau Pressure:  [19 cmH20-26 cmH20] 22 cmH20   INTAKE / OUTPUT:  Intake/Output Summary (Last 24 hours) at 01/08/15 0848 Last data filed at 01/08/15 0600  Gross per 24 hour  Intake 2242.15 ml  Output    850 ml  Net 1392.15 ml    PHYSICAL EXAMINATION: General: Morbidly obese male on vent in NAD Neuro: Sedate, does not follow commands  HEENT: NCAT, very short, thick neck Cardiovascular: Reg, no M Lungs: even/non-labored, lungs bilaterally distant but clear Abdomen:  Obese, +BS, soft Ext: R shoulder surgical dressing, no edema, sling in place  LABS:  CBC  Recent Labs Lab 01/06/15 0500 01/07/15 0410 01/08/15 0350  WBC 15.0* 18.0* 15.6*  HGB 15.9 14.3 13.4  HCT 52.7* 48.0 43.4  PLT 121* 116* 120*   Coag's No results for input(s): APTT, INR in the last 168 hours.   BMET  Recent Labs Lab 01/06/15 1504 01/07/15 0410 01/08/15 0350  NA 152* 146* 143  K 3.7 3.9 4.0  CL 111 106 103  CO2 30 30 28   BUN 96* 129* 165*  CREATININE 3.28* 3.41* 4.10*  GLUCOSE 223* 202* 193*   Electrolytes  Recent Labs Lab 01/01/15 1809  01/04/15 0350  01/06/15 1504 01/07/15 0410 01/08/15 0350  CALCIUM 9.3  < >  --   < >  9.1 8.8* 8.6*  MG 2.0  --  2.4  --  2.8*  --   --   PHOS  --   --  2.6  --   --   --   --   < > = values in this interval not displayed. Sepsis Markers  Recent Labs Lab 01/06/15 0430 01/06/15 0738  LATICACIDVEN 1.8 1.8   ABG  Recent Labs Lab 01/04/15 0406 01/05/15 0500 01/06/15 0440  PHART 7.402 7.386 7.434  PCO2ART 62.6* 64.7* 49.3*  PO2ART 87.3 81.4 95.8   Liver Enzymes  Recent Labs Lab 01/02/15 0430  AST 45*  ALT 29  ALKPHOS 80  BILITOT 1.9*  ALBUMIN 3.3*   Cardiac Enzymes  Recent Labs Lab 01/06/15 0738 01/06/15 1346 01/06/15 2010  TROPONINI 0.48*  0.39* 0.36*   Glucose  Recent Labs Lab 01/07/15 1129 01/07/15 1444 01/07/15 1948 01/07/15 2341 01/08/15 0350 01/08/15 0748  GLUCAP 153* 185* 173* 199* 183* 198*    CXR: 5/3 - cardiomegaly, LLL atx vs consolidation    ASSESSMENT / PLAN:  PULMONARY A:  Acute Hypercarbic / Hypoxemic Resp Failure RLL ATX - improved, LL atx 4/30 s/p bronchoscopy 5/2 Pulmonary Edema - suspect in setting of dCHF 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 (Hgb 18, serum CO2 32) Likely OHS/OSA Anticipate difficult airway due to body habitus COPD  P:   Cont MV support, 8 cc/kg PEEP/  FiO2 for sats > 90% per ARDS protocol Hold off SBT  While FIO2 requirement high Pulmicort Q12, duonebs Plan for trach 5/4 -brother agreeable. Chest PT  CARDIOVASCULAR A:  Hypotension - resolved. Minimal elevation of trop I - doubt ACS Hx HTN Hypertriglyceridemia  Diastolic Dysfunction - grade 1 on ECHO 4/26 VT - episode x 2 4/26 P:  Monitor BP and rhythm PRN hydralazine  ASA 325 QD Monitor rhythm  HOLD ACE, hold metoprolol until BP picks up  RENAL/GU A:  Hypernatremia , AKI 4/30  Scrotal mass P:   Monitor BMET intermittently Monitor I/Os Correct electrolytes as indicated Urology consultation after acute critical phase of illness resolves for scrotal mass Free water 100 Q8 Hold lasix, appears overdiuresed Renal consult  GASTROINTESTINAL A:  Obesity Vent Associated Dysphagia - 4/28 high residuals for TF Off reglan due to VT episodes  P:   SUP: enteral famotidine Cont TFs Monitor residuals   HEMATOLOGIC A:   Polycythemia - likely chronic hypoxemia Macrocytosis  P:  DVT px: SQ heparin Monitor CBC intermittently Transfuse per usual ICU guidelines Folate  INFECTIOUS A:   HCAP /atelectasis Persistent fever P:  4/27 bc>>ngtd 4/27 uc>>ng 4/27 sputum>>ngtd 4/27 vanc>>5/2 4/27 cefepime>>  ENDOCRINE CBG (last 3)   Recent Labs  01/07/15 2341 01/08/15 0350  01/08/15 0748  GLUCAP 199* 183* 198*     A:   Hyperglycemia, controlled   P:   SSI  Q4 CBG 4/29 added  lantus   NEUROLOGIC A:  Acute Hypercarbic Encephalopathy Post op pain s/p R rotator cuff surgery ICU associated discomfort P:   RASS goal: -1 Off propofol , OK to use  fentanyl gtt  post-op care per Ortho  Daily WUA    GLOBAL: updated brother 5/2 am  Summary  - suspect HCAP with LLL atelectasis,now complicated by AKI Plan for  ENT tracheostomy 5/4  The patient is critically ill with multiple organ systems failure and requires high complexity decision making for assessment and support, frequent evaluation and titration of therapies, application of advanced monitoring technologies and extensive interpretation of multiple databases.  Critical Care Time devoted to patient care services described in this note independent of APP time is 35 minutes.   Cyril Mourning MD. Tonny Bollman. [facility] Pager [phone] If no response call [phone]     01/08/2015, 8:48 AM

## 2015-01-08 NOTE — Anesthesia Preprocedure Evaluation (Deleted)
Anesthesia Evaluation  Patient identified by MRN, date of birth, ID band  Reviewed: Allergy & Precautions, NPO status , Patient's Chart, lab work & pertinent test results, reviewed documented beta blocker date and time   Airway        Dental   Pulmonary sleep apnea and Continuous Positive Airway Pressure Ventilation , pneumonia -, COPD COPD inhaler, Oh Smoker,  Vent dependent, PEEP 12, 70%         Cardiovascular hypertension,  ECHO EF 60% 01/01/2015   Neuro/Psych Mental function depressed    GI/Hepatic negative GI ROS, Neg liver ROS,   Endo/Other  diabetesMorbid obesity  Renal/GU Renal Insufficiency and ARFRenal diseaseCreat 4.1     Musculoskeletal   Abdominal   Peds  Hematology   Anesthesia Other Findings   Reproductive/Obstetrics                             Anesthesia Physical Anesthesia Plan  ASA: IV  Anesthesia Plan: General   Post-op Pain Management:    Induction: Intravenous  Airway Management Planned: Oral ETT  Additional Equipment:   Intra-op Plan:   Post-operative Plan:   Informed Consent: I have reviewed the patients History and Physical, chart, labs and discussed the procedure including the risks, benefits and alternatives for the proposed anesthesia with the patient or authorized representative who has indicated his/her understanding and acceptance.     Plan Discussed with:   Anesthesia Plan Comments: (Have Glide in room in case we loose airway, difficult airway cart available, will need to transport with PEEP, )        Anesthesia Quick Evaluation

## 2015-01-08 NOTE — Consult Note (Signed)
Renal Service Consult Note [facility]  Fayrene FearingJames [name] 01/08/2015 Maree KrabbeSCHERTZ,Zuriyah Shatz D Requesting Physician:  Dr Kendrick FriesMcQuaid  Reason for Consult:  Acute renal failure HPI: The patient is a 64 y.o. year-old with hx of DJD and obesity admitted for R rotator cuff repair done on 4/22. Post-op declined with resp failure then next night, intubated for hypercarbic resp failure.  Was being treated for HCAP and sepsis with vanc/ cefipime. Since 4/30 creat rising now up to 4.10 today .  BUN 165.  UOP dropped off the last few days.  Pressors started also on 4/30 and still on neo gtt. No IV contrast. Vanc stopped 48 hrs ago and cefipime still on med list.  UA not done.  ROS n/a w pt on vent  Past Medical History  Past Medical History  Diagnosis Date  . Arthritis   . RCT (rotator cuff tear)   . Multiple abrasions     rt arm  . Morbid obesity    Past Surgical History  Past Surgical History  Procedure Laterality Date  . No past surgeries    . Shoulder arthroscopy with subacromial decompression, rotator cuff repair and bicep tendon repair Right 12/28/2014    Procedure: RIGHT SHOULDER ARTHROSCOPY WITH SUBACROMIAL DECOMPRESSION, DISTAL CLAVICLE RESECTION, ROTATOR CUFF REPAIR ;  Surgeon: Eugenia Mcalpineobert Collins, MD;  Location: [facility];  Service: Orthopedics;  Laterality: Right;   Family History  Family History  Problem Relation Age of Onset  . Heart disease Mother   . Heart attack Mother   . Heart disease Brother    Social History  reports that he has been smoking.  He does not have any smokeless tobacco history on file. He reports that he does not drink alcohol or use illicit drugs. Allergies No Known Allergies Home medications Prior to Admission medications   Medication Sig Start Date End Date Taking? Authorizing Provider  HYDROcodone-acetaminophen (NORCO/VICODIN) 5-325 MG per tablet Take 1 tablet by mouth at bedtime. 11/14/14  Yes Historical Provider, MD  ibuprofen (ADVIL,MOTRIN) 200 MG tablet Take  200 mg by mouth every 6 (six) hours as needed.   Yes Historical Provider, MD  baclofen (LIORESAL) 10 MG tablet Take 1 tablet (10 mg total) by mouth 3 (three) times daily as needed for muscle spasms. 12/28/14   Bryson L Stilwell, PA-C  cephALEXin (KEFLEX) 500 MG capsule Take 1 capsule (500 mg total) by mouth 4 (four) times daily. 12/28/14   Bryson L Stilwell, PA-C  oxyCODONE-acetaminophen (ROXICET) 5-325 MG per tablet Take 1-2 tablets by mouth every 4 (four) hours as needed for severe pain. 12/28/14   Markham JordanBryson L Stilwell, PA-C   Liver Function Tests  Recent Labs Lab 01/02/15 0430  AST 45*  ALT 29  ALKPHOS 80  BILITOT 1.9*  PROT 7.2  ALBUMIN 3.3*   No results for input(s): LIPASE, AMYLASE in the last 168 hours. CBC  Recent Labs Lab 01/06/15 0500 01/07/15 0410 01/08/15 0350  WBC 15.0* 18.0* 15.6*  HGB 15.9 14.3 13.4  HCT 52.7* 48.0 43.4  MCV 104.8* 103.4* 102.6*  PLT 121* 116* 120*   Basic Metabolic Panel  Recent Labs Lab 01/01/15 1809 01/02/15 0430 01/04/15 0350 01/05/15 0419 01/06/15 0430 01/06/15 1504 01/07/15 0410 01/08/15 0350  NA 142 143  --  156* 154* 152* 146* 143  K 4.1 4.0  --  3.5 3.8 3.7 3.9 4.0  CL 93* 98  --  108 113* 111 106 103  CO2 37* 40*  --  37* 33* 30 30 28  GLUCOSE 177* 142*  --  178* 196* 223* 202* 193*  BUN 24* 30*  --  49* 91* 96* 129* 165*  CREATININE 0.67 0.78  --  1.11 3.42* 3.28* 3.41* 4.10*  CALCIUM 9.3 9.0  --  9.7 9.3 9.1 8.8* 8.6*  PHOS  --   --  2.6  --   --   --   --   --     Filed Vitals:   01/08/15 1500 01/08/15 1530 01/08/15 1600 01/08/15 1627  BP: 105/46 107/57 114/56   Pulse: 80 80 83   Temp:   99.8 F (37.7 C)   TempSrc:   Oral   Resp: Height:      Weight:      SpO2: 95% 98% 95% 95%   Exam On vent, eyes open No rash, cyanosis or gangrene Sclera anicteric, throat clear No jvd Chest clear ant and lat RRR no rub or gallop Abd very obese, ND NT +BS x 4 GU foley in place Ext 1+ diffuse edema or LE's and  UE's Nuero is responsive  Renal US 11-13 cm kidneys , no hydro UA pending Meds - Maxipime, vanc x 4 days, pepcid, asa, MTP, reglan, KCL, diprivan, neo gtt, fentanyl gtt   Assessment: 1. Acute renal failure - suspect ATN d/t shock, oliguric and azotemic 2. Shock r/o sepsis, on pressors 3. S/P R rotator cuff repair 4/22 4. Morbid obesity 5. VDRF   Plan- will likely require CRRT tomorrow. Will follow.  UA, UNa and Ucreat.   Vinson Moselle MD (pgr) [phone]    [phone] 01/08/2015, 4:34 PM

## 2015-01-08 NOTE — Progress Notes (Signed)
Inpatient Diabetes Program Recommendations  AACE/ADA: New Consensus Statement on Inpatient Glycemic Control (2013)  Target Ranges:  Prepandial:   less than 140 mg/dL      Peak postprandial:   less than 180 mg/dL (1-2 hours)      Critically ill patients:  140 - 180 mg/dL     Results for [name], [place] (MRN [id number]) as of 01/08/2015 09:26  Ref. Range 01/07/2015 23:41 01/08/2015 03:50 01/08/2015 07:48  Glucose-Capillary Latest Ref Range: 70-99 mg/dL 102199 (H) 725183 (H) 366198 (H)      Redlich Insulin Orders: Lantus 10 units bid      Novolog Moderate SSI Q4 hours    **Patient currently receiving Vital HP tube feeds at 40cc/hour.  **Having occasional glucose elevations.  **Note patient planned for possible trach tomorrow (05/04).    MD- Please consider adding low dose Novolog tube feed coverage to help with glucose control-  Novolog 2 units Q4 hours (hold when tube feeds held for any reason)     Will follow Ambrose FinlandJeannine Johnston Dontrey Snellgrove RN, MSN, CDE Diabetes Coordinator Inpatient Diabetes Program Team Pager: [phone] (8a-5p)

## 2015-01-09 ENCOUNTER — Inpatient Hospital Stay (HOSPITAL_COMMUNITY): Payer: Worker's Compensation

## 2015-01-09 ENCOUNTER — Encounter (HOSPITAL_COMMUNITY)
Admission: RE | Disposition: A | Discharge status: Nursing Home (Includes ICF) | Payer: Self-pay | Source: Ambulatory Visit | Attending: Pulmonary Disease

## 2015-01-09 DIAGNOSIS — M7989 Other specified soft tissue disorders: Secondary | ICD-10-CM

## 2015-01-09 LAB — GLUCOSE, CAPILLARY
GLUCOSE-CAPILLARY: 159 mg/dL — AB (ref 70–99)
GLUCOSE-CAPILLARY: 189 mg/dL — AB (ref 70–99)
Glucose-Capillary: 187 mg/dL — ABNORMAL HIGH (ref 70–99)
Glucose-Capillary: 157 mg/dL — ABNORMAL HIGH (ref 70–99)
Glucose-Capillary: 184 mg/dL — ABNORMAL HIGH (ref 70–99)
Glucose-Capillary: 191 mg/dL — ABNORMAL HIGH (ref 70–99)

## 2015-01-09 LAB — BASIC METABOLIC PANEL
Anion gap: 15 (ref 5–15)
BUN: 194 mg/dL — AB (ref 6–20)
CHLORIDE: 102 mmol/L (ref 101–111)
CO2: 27 mmol/L (ref 22–32)
CREATININE: 5.51 mg/dL — AB (ref 0.61–1.24)
Calcium: 8.8 mg/dL — ABNORMAL LOW (ref 8.9–10.3)
GFR calc Af Amer: 12 mL/min — ABNORMAL LOW (ref >60)
GFR calc non Af Amer: 10 mL/min — ABNORMAL LOW (ref >60)
Glucose, Bld: 195 mg/dL — ABNORMAL HIGH (ref 70–99)
Potassium: 4.1 mmol/L (ref 3.5–5.1)
Sodium: 144 mmol/L (ref 135–145)

## 2015-01-09 LAB — RENAL FUNCTION PANEL
ALBUMIN: 2.4 g/dL — AB (ref 3.5–5.0)
Anion gap: 17 — ABNORMAL HIGH (ref 5–15)
BUN: 226 mg/dL — AB (ref 6–20)
CALCIUM: 8.7 mg/dL — AB (ref 8.9–10.3)
CHLORIDE: 99 mmol/L — AB (ref 101–111)
CO2: 25 mmol/L (ref 22–32)
Creatinine, Ser: 6.22 mg/dL — ABNORMAL HIGH (ref 0.61–1.24)
GFR calc Af Amer: 10 mL/min — ABNORMAL LOW (ref >60)
GFR, EST NON AFRICAN AMERICAN: 9 mL/min — AB (ref >60)
Glucose, Bld: 187 mg/dL — ABNORMAL HIGH (ref 70–99)
Phosphorus: 8 mg/dL — ABNORMAL HIGH (ref 2.5–4.6)
Potassium: 4 mmol/L (ref 3.5–5.1)
Sodium: 141 mmol/L (ref 135–145)

## 2015-01-09 LAB — CBC
HCT: 40.7 % (ref 39.0–52.0)
Hemoglobin: 12.6 g/dL — ABNORMAL LOW (ref 13.0–17.0)
MCH: 31.1 pg (ref 26.0–34.0)
MCHC: 31 g/dL (ref 30.0–36.0)
MCV: 100.5 fL — AB (ref 78.0–100.0)
PLATELETS: 142 10*3/uL — AB (ref 150–400)
RBC: 4.05 MIL/uL — AB (ref 4.22–5.81)
RDW: 16.5 % — ABNORMAL HIGH (ref 11.5–15.5)
WBC: 14.3 10*3/uL — ABNORMAL HIGH (ref 4.0–10.5)

## 2015-01-09 LAB — CULTURE, RESPIRATORY

## 2015-01-09 LAB — CULTURE, BLOOD (ROUTINE X 2)
CULTURE: NO GROWTH
Culture: NO GROWTH

## 2015-01-09 LAB — CULTURE, RESPIRATORY W GRAM STAIN: Culture: NO GROWTH

## 2015-01-09 LAB — PROTIME-INR
INR: 1.07 (ref 0.00–1.49)
PROTHROMBIN TIME: 14 s (ref 11.6–15.2)

## 2015-01-09 LAB — APTT: aPTT: 25 seconds (ref 24–37)

## 2015-01-09 SURGERY — CREATION, TRACHEOSTOMY
Anesthesia: General

## 2015-01-09 MED ORDER — CHLORHEXIDINE GLUCONATE 4 % EX LIQD
1.0000 "application " | Freq: Once | CUTANEOUS | Status: DC
  Filled 2015-01-09: qty 15

## 2015-01-09 MED ORDER — PROPOFOL 10 MG/ML IV BOLUS
INTRAVENOUS | Status: AC
  Filled 2015-01-09: qty 20

## 2015-01-09 MED ORDER — HEPARIN BOLUS VIA INFUSION (CRRT): 1000.0000 [IU] | INTRAVENOUS | Status: DC | PRN

## 2015-01-09 MED ORDER — DEXTROSE 5 % IV SOLN
2.0000 g | Freq: Two times a day (BID) | INTRAVENOUS | Status: DC
  Administered 2015-01-10: 2 g via INTRAVENOUS
  Filled 2015-01-09 (×2): qty 2

## 2015-01-09 MED ORDER — MIDAZOLAM HCL 2 MG/2ML IJ SOLN
INTRAMUSCULAR | Status: AC
  Filled 2015-01-09: qty 4

## 2015-01-09 MED ORDER — FENTANYL CITRATE (PF) 250 MCG/5ML IJ SOLN
INTRAMUSCULAR | Status: AC
  Filled 2015-01-09: qty 5

## 2015-01-09 MED ORDER — ONDANSETRON HCL 4 MG/2ML IJ SOLN
INTRAMUSCULAR | Status: AC
  Filled 2015-01-09: qty 2

## 2015-01-09 MED ORDER — ALTEPLASE 2 MG IJ SOLR
2.0000 mg | Freq: Once | INTRAMUSCULAR | Status: AC | PRN
  Filled 2015-01-09: qty 2

## 2015-01-09 MED ORDER — MIDAZOLAM HCL 2 MG/2ML IJ SOLN
1.0000 mg | Freq: Once | INTRAMUSCULAR | Status: AC
  Administered 2015-01-09: 1 mg via INTRAVENOUS

## 2015-01-09 MED ORDER — PRISMASOL BGK 4/2.5 32-4-2.5 MEQ/L IV SOLN
INTRAVENOUS | Status: DC
  Administered 2015-01-09 – 2015-01-12 (×21): via INTRAVENOUS_CENTRAL
  Filled 2015-01-09 (×35): qty 5000

## 2015-01-09 MED ORDER — PRISMASOL BGK 4/2.5 32-4-2.5 MEQ/L IV SOLN
INTRAVENOUS | Status: DC
  Administered 2015-01-09: 20:00:00 via INTRAVENOUS_CENTRAL
  Filled 2015-01-09 (×5): qty 5000

## 2015-01-09 MED ORDER — HEPARIN (PORCINE) IN NACL 100-0.45 UNIT/ML-% IJ SOLN
1700.0000 [IU]/h | INTRAMUSCULAR | Status: DC
  Administered 2015-01-09 – 2015-01-14 (×9): 1600 [IU]/h via INTRAVENOUS
  Administered 2015-01-15: 1700 [IU]/h via INTRAVENOUS
  Filled 2015-01-09 (×15): qty 250

## 2015-01-09 MED ORDER — HEPARIN (PORCINE) 2000 UNITS/L FOR CRRT: INTRAVENOUS_CENTRAL | Status: DC | PRN

## 2015-01-09 MED ORDER — HEPARIN SODIUM (PORCINE) 1000 UNIT/ML DIALYSIS
1000.0000 [IU] | INTRAMUSCULAR | Status: DC | PRN
  Administered 2015-01-09 (×2): 1600 [IU] via INTRAVENOUS_CENTRAL
  Filled 2015-01-09 (×4): qty 6

## 2015-01-09 MED ORDER — ROCURONIUM BROMIDE 100 MG/10ML IV SOLN
INTRAVENOUS | Status: AC
  Filled 2015-01-09: qty 1

## 2015-01-09 MED ORDER — LIP MEDEX EX OINT: TOPICAL_OINTMENT | CUTANEOUS | Status: DC | PRN

## 2015-01-09 MED ORDER — PRISMASOL BGK 4/2.5 32-4-2.5 MEQ/L IV SOLN
INTRAVENOUS | Status: DC
  Administered 2015-01-09 – 2015-01-11 (×5): via INTRAVENOUS_CENTRAL
  Filled 2015-01-09 (×8): qty 5000

## 2015-01-09 MED ORDER — MIDAZOLAM HCL 2 MG/2ML IJ SOLN
INTRAMUSCULAR | Status: AC
  Filled 2015-01-09: qty 2

## 2015-01-09 MED ORDER — LACTULOSE 10 GM/15ML PO SOLN
30.0000 g | Freq: Once | ORAL | Status: AC
  Administered 2015-01-09: 30 g
  Filled 2015-01-09: qty 45
  Filled 2015-01-09: qty 15

## 2015-01-09 MED ORDER — HEPARIN SODIUM (PORCINE) 1000 UNIT/ML IJ SOLN
1000.0000 [IU] | INTRAMUSCULAR | Status: DC | PRN
  Administered 2015-01-12: 3000 [IU]
  Filled 2015-01-09 (×3): qty 6

## 2015-01-09 NOTE — Procedures (Addendum)
Central Venous Catheter Insertion Procedure Note Marjory LiesJames Essex [id number] 1950/10/10  Procedure: Insertion of Central Venous Catheter Indications: hemodialysis  Procedure Details Consent: Risks of procedure as well as the alternatives and risks of each were explained to the (patient/caregiver).  Consent for procedure obtained. Time Out: Verified patient identification, verified procedure, site/side was marked, verified correct patient position, special equipment/implants available, medications/allergies/relevent history reviewed, required imaging and test results available.  Performed  Maximum sterile technique was used including antiseptics, cap, gloves, gown, hand hygiene, mask and sheet. Skin prep: Chlorhexidine; local anesthetic administered A antimicrobial bonded/coated triple lumen catheter was placed in the left femoral vein due to  no other available access using the Seldinger technique.  Evaluation Blood flow good Complications: No apparent complications Patient did tolerate procedure well. Chest X-ray ordered to verify placement.  CXR: not required.   Procedure performed expertly by Veleta Minersllis NP, supervised by me Ultrasound used for site verification, live visualisation of needle entry & guidewire prior to dilation  Ava Tangney V. 01/09/2015, 12:35 PM

## 2015-01-09 NOTE — Progress Notes (Addendum)
Pt not going for trach at this time. OK to wean pt on peep of 8 per MD. Per MD do not go below peep of 8 as well.

## 2015-01-09 NOTE — Progress Notes (Addendum)
NUTRITION FOLLOW-UP  INTERVENTION: -Continue Vital HP @ 40 mL/hr via OGT. -Prostat to 60 ml Prostat QID.    Tube feeding regimen provides 1760 kcal (11.4 kcal/kg actual body weight), 196 grams of protein, and 806 ml of H2O.   - Recommend consideration of bowel regimen (pt with no bm since 4/27.)   RD will continue to monitor  NUTRITION DIAGNOSIS: Inadequate oral intake related to inability to eat as evidenced by NPO status, ongoing.  Goal: Enteral nutrition to provide 65-70% of estimated calorie needs based on ASPEN guidelines for hypocaloric, high protein feeding in critically ill obese individuals  Monitor:  TF regimen & tolerance, respiratory status, weight, labs, I/O's  Admitting Dx: Acute encephalopathy  ASSESSMENT: 64 y/o with h/o HTN who was admitted to step down after difficulty extubating after rotator cuff repair. Has a 44 pack year smoking hx but no formal COPD diagnosis. 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  S/p 4/22 Procedure(s) (LRB): RIGHT SHOULDER ARTHROSCOPY WITH SUBACROMIAL DECOMPRESSION, DISTAL CLAVICLE RESECTION, ROTATOR CUFF REPAIR (Right)  - Per RN, pt tolerating TF at goal rate with minimal residuals until held after midnight 5/3.  - Pt scheduled for trach 5/4, but will not be placed today. - Spoke with RN, TF to be re-started at goal rate. X-ray for OGT replacement pending.  - Pt to start CRRT today.  - Pt with no bm since 4/27. Recommend bowel regimen.   Patient is currently intubated on ventilator support MV: 11.0 L/min Temp (24hrs), Avg:100.1 F (37.8 C), Min:98.9 F (37.2 C), Max:101.2 F (38.4 C)  Propofol: none  Labs reviewed: Elevated BUN & Creatinine CBGs: 157-187  Height: Ht Readings from Last 1 Encounters:  01/07/15 5\' 11"  (1.803 m)    Weight: Wt Readings from Last 1 Encounters:  01/09/15 347 lb (157.398  kg)  01/02/15 339 lb 12/28/14 381 lb  Ideal Body Weight: 172 lb  % Ideal Body Weight: 194%  Wt Readings from Last 10 Encounters:  01/09/15 347 lb (157.398 kg)  11/29/14 360 lb 3.2 oz (163.386 kg)   BMI:  Body mass index is 48.42 kg/(m^2).  Re-estimated Nutritional Needs: Kcal: 2548             1667- 2121 (11-14 kcal/kg actual body weight) Protein: >195 g Fluid: Per MD  Skin: shoulder incision, scrotum wound  Diet Order: Diet - low sodium heart healthy Diet NPO time specified  EDUCATION NEEDS: -No education needs identified at this time   Intake/Output Summary (Last 24 hours) at 01/09/15 1244 Last data filed at 01/09/15 1100  Gross per 24 hour  Intake 1723.03 ml  Output    460 ml  Net 1263.03 ml    Last BM: 4/27  Labs:   Recent Labs Lab 01/04/15 0350  01/06/15 1504 01/07/15 0410 01/08/15 0350 01/09/15 0515  NA  --   < > 152* 146* 143 144  K  --   < > 3.7 3.9 4.0 4.1  CL  --   < > 111 106 103 102  CO2  --   < > 30 30 28 27   BUN  --   < > 96* 129* 165* 194*  CREATININE  --   < > 3.28* 3.41* 4.10* 5.51*  CALCIUM  --   < > 9.1 8.8* 8.6* 8.8*  MG 2.4  --  2.8*  --   --   --  PHOS 2.6  --   --   --   --   --   GLUCOSE  --   < > 223* 202* 193* 195*  < > = values in this interval not displayed.  CBG (last 3)   Recent Labs  01/09/15 0401 01/09/15 0752 01/09/15 1146  GLUCAP 187* 157* 159*    Scheduled Meds: . alteplase  2 mg Intracatheter Once  . antiseptic oral rinse  7 mL Mouth Rinse QID  . aspirin  325 mg Oral Daily  . budesonide  0.5 mg Nebulization BID  . ceFEPime (MAXIPIME) IV  2 g Intravenous Q24H  . chlorhexidine  1 application Topical Once  . chlorhexidine  1 application Topical Once  . chlorhexidine  15 mL Mouth Rinse BID  . famotidine  20 mg Per Tube BID  . feeding supplement (PRO-STAT SUGAR FREE 64)  60 mL Oral QID  . feeding supplement (VITAL HIGH PROTEIN)  1,000 mL Per Tube Q24H  . folic acid  1 mg Per Tube Daily  . free water  200  mL Per Tube 3 times per day  . heparin subcutaneous  5,000 Units Subcutaneous 3 times per day  . insulin aspart  0-15 Units Subcutaneous 6 times per day  . insulin glargine  10 Units Subcutaneous BID  . ipratropium-albuterol  3 mL Nebulization Q6H  . midazolam      . midazolam  1 mg Intravenous Once  . sodium chloride  10-40 mL Intracatheter Q12H    Continuous Infusions: . sodium chloride 10 mL/hr at 01/08/15 1500  . fentaNYL infusion INTRAVENOUS 25 mcg/hr (01/09/15 0830)  . phenylephrine (NEO-SYNEPHRINE) Adult infusion 60 mcg/min (01/09/15 0900)  . dialysis replacement fluid (prismasate)    . dialysis replacement fluid (prismasate)    . dialysate (PRISMASATE)      Emmaline KluverHaley Zamia Tyminski MS, RD, LDN [phone]

## 2015-01-09 NOTE — Progress Notes (Signed)
Hand off report called to Delsa Saleyril, Charity fundraiserN.  [facility]  Notified Chrissie NoaWilliam Catalfamo, Brother.

## 2015-01-09 NOTE — Progress Notes (Signed)
[facility] RtMarisue Ivan( Liz) has been notified of pt coming.

## 2015-01-09 NOTE — Progress Notes (Signed)
*  PRELIMINARY RESULTS* Vascular Ultrasound Lower extremity venous duplex has been completed.  Preliminary findings: Extensive occlusive DVT noted in bilateral lower extremities. Specifically on the Right common femoral, femoral, profunda femoral, popliteal, gastroc, posterior tibial and peroneal veins, and on the Left common femoral, femoral, popliteal, posterior tibial, and peroneal veins.   Farrel DemarkJill Eunice, RDMS, RVT  01/09/2015, 2:16 PM

## 2015-01-09 NOTE — Progress Notes (Signed)
Returned versed 2 mg vial to pharmacy.  Witness: Terry GreAmadeo Garnetene, Pharmacist. Wasted versed 1 mg IV.  Witness:  Iran OuchStephanie Russell, RN.

## 2015-01-09 NOTE — Progress Notes (Signed)
[facility] Progress Note   Subjective: UOP 580 cc yest, BUN 190 and creat 6.4.  Neo gtt down to 80 mcg /min  Filed Vitals:   01/09/15 1148 01/09/15 1200 01/09/15 1300 01/09/15 1330  BP:  88/48 94/57 97/53   Pulse:  79 75 77  Temp: 99.5 F (37.5 C)     TempSrc: Oral     Resp:  19 22 22   Height:      Weight:      SpO2:  92% 93% 93%   Exam: On vent, eyes open No jvd Chest clear ant and lat RRR no rub or gallop Abd very obese, ND NT +BS x 4 GU foley in place Ext 1+ diffuse edema of LE's and UE's Nuero is responsive  Renal US 11-13 cm kidneys , no hydro UA 5.0, 1.020, prot 30, rbc 21-50/hpf, wbc 0-2/hpf UNa 30, UCreat 230 Meds - Maxipime, vanc x 4 days, pepcid, asa, MTP, reglan, KCL, diprivan, neo gtt, fentanyl gtt CXR 5/4 mild vasc congestion, L basilar process no chg  Assessment: 1. Acute renal failure - ATN d/t shock. Nonoliguric, B/Cr rising. [facility] and initiation of CRRT 2. Shock r/o sepsis, on pressors 3. S/P R rotator cuff repair 4/22 4. Morbid obesity 5. VDRF 6. Bilat LE DVT - to start on IV hep  Plan - CRRT to start today [facility], keep even, no hep (will be on hep gtt for DVT)    Vinson Moselleob Dawnna Gritz MD  pager [phone]    cell [phone]  01/09/2015, 2:32 PM     Recent Labs Lab 01/04/15 0350  01/07/15 0410 01/08/15 0350 01/09/15 0515  NA  --   < > 146* 143 144  K  --   < > 3.9 4.0 4.1  CL  --   < > 106 103 102  CO2  --   < > 30 28 27   GLUCOSE  --   < > 202* 193* 195*  BUN  --   < > 129* 165* 194*  CREATININE  --   < > 3.41* 4.10* 5.51*  CALCIUM  --   < > 8.8* 8.6* 8.8*  PHOS 2.6  --   --   --   --   < > = values in this interval not displayed. No results for input(s): AST, ALT, ALKPHOS, BILITOT, PROT, ALBUMIN in the last 168 hours.  Recent Labs Lab 01/07/15 0410 01/08/15 0350 01/09/15 0515  WBC 18.0* 15.6* 14.3*  HGB 14.3 13.4 12.6*  HCT 48.0 43.4 40.7  MCV 103.4* 102.6* 100.5*  PLT 116* 120*  142*   . alteplase  2 mg Intracatheter Once  . antiseptic oral rinse  7 mL Mouth Rinse QID  . aspirin  325 mg Oral Daily  . budesonide  0.5 mg Nebulization BID  . ceFEPime (MAXIPIME) IV  2 g Intravenous Q24H  . chlorhexidine  1 application Topical Once  . chlorhexidine  1 application Topical Once  . chlorhexidine  15 mL Mouth Rinse BID  . famotidine  20 mg Per Tube BID  . feeding supplement (PRO-STAT SUGAR FREE 64)  60 mL Oral QID  . feeding supplement (VITAL HIGH PROTEIN)  1,000 mL Per Tube Q24H  . folic acid  1 mg Per Tube Daily  . free water  200 mL Per Tube 3 times per day  . heparin subcutaneous  5,000 Units Subcutaneous 3 times per day  . insulin aspart  0-15 Units Subcutaneous 6  times per day  . insulin glargine  10 Units Subcutaneous BID  . ipratropium-albuterol  3 mL Nebulization Q6H  . sodium chloride  10-40 mL Intracatheter Q12H   . sodium chloride 10 mL/hr at 01/08/15 1500  . fentaNYL infusion INTRAVENOUS 50 mcg/hr (01/09/15 1400)  . phenylephrine (NEO-SYNEPHRINE) Adult infusion 80 mcg/min (01/09/15 1400)  . dialysis replacement fluid (prismasate)    . dialysis replacement fluid (prismasate)    . dialysate (PRISMASATE)     acetaminophen **OR** acetaminophen, alteplase, alum & mag hydroxide-simeth, fentaNYL, fentaNYL (SUBLIMAZE) injection, heparin, heparin, heparin, hydrALAZINE, lip balm, [DISCONTINUED] ondansetron **OR** ondansetron (ZOFRAN) IV, sennosides, sodium chloride

## 2015-01-09 NOTE — Progress Notes (Addendum)
During ultrasound assessment of bilateral femoral sites for potential placement of HD catheter, R femoral vein was noted to have concern for clot.  Formal US imaging ordered.  US tech noted clot in both veins up to the femoral vein.      Bilateral DVT - clot noted up to femoral veins.  Will assume PE as well as we can not perform CTA due to renal failure.    Plan: Heparin gtt per pharmacy for bilateral DVT Remove SCD's    Canary BrimBrandi Kellyn Mccary, NP-C [facility] Pgr: [phone] or [phone]

## 2015-01-09 NOTE — Evaluation (Signed)
Occupational Therapy Evaluation Patient Details Name: [name] MRN: [id number] DOB: 1950/10/25 Today's Date: 01/09/2015    History of Present Illness pt is s/p VDRF after R RCR, SAD, distal clavicle repair   Clinical Impression   This 64 year old man was admitted for the above.  He will benefit from skilled OT for PROM of R elbow/elbow, and monitoring of skin during this and ADLs.  Pt is currently on vent.  Will update goals as he progresses    Follow Up Recommendations   (to be further assessed; depending upon progress)    Equipment Recommendations   (to be further assessed)    Recommendations for Other Services       Precautions / Restrictions Precautions Precautions: Fall Restrictions Weight Bearing Restrictions: No  Sling on, except removed for PROM of Elbow and wrist (R)     Mobility Bed Mobility Overal bed mobility: +2 for physical assistance             General bed mobility comments: total Assist  Transfers                 General transfer comment: unable    Balance                                            ADL Overall ADL's : Needs assistance/impaired                                       General ADL Comments: pt is total A for ADLs at this time; A x 2 for LB--vent dependent.  He is opening eyes at times.  Washed pt's RUE, repositioned on pillow and  placed velfoam under chin area and washcloth at top of trough to decrease pressure.  Hung sign for positioning arm.  Removed sling and performed PROM to elbow and wrist (R).  Will monitor skin in elbow area as it was starting to [facility].  Will touch base with RN about interdry silver dressing if needed     Vision     Perception     Praxis      Pertinent Vitals/Pain Pain Assessment: Faces Faces Pain Scale: No hurt     Hand Dominance     Extremity/Trunk Assessment Upper Extremity Assessment Upper Extremity Assessment: RUE deficits/detail RUE  Deficits / Details: immobilized:  PROM to elbow, wrist WFLs           Communication Communication Communication:  (Pt awakening at times; on vent)   Cognition Arousal/Alertness: Awake/alert Behavior During Therapy: WFL for tasks assessed/performed Overall Cognitive Status: Within Functional Limits for tasks assessed                     General Comments       Exercises       Shoulder Instructions      Home Living                                   Additional Comments: unknown      Prior Functioning/Environment          Comments: unknown    OT Diagnosis: Generalized weakness   OT Problem List: Decreased strength;Impaired UE functional use;Decreased activity tolerance;Decreased  cognition   OT Treatment/Interventions: Self-care/ADL training;Therapeutic exercise;Therapeutic activities (PROM to R elbow and wrist)    OT Goals([place] goals can be found in the care plan section) Acute Rehab OT Goals Patient Stated Goal: unable to state OT Goal Formulation: Patient unable to participate in goal setting Time For Goal Achievement: 01/23/15 Potential to Achieve Goals: Good ADL Goals Additional ADL Goal #1: pt will tolerate sling without skin compromise, as noted during PROM to elbow and wrist and RUE ADLs  OT Frequency: Min 6X/week   Barriers to D/C:            Co-evaluation              End of Session    Activity Tolerance: Patient tolerated treatment well Patient left: in bed;with call bell/phone within reach   Time: 0757-0831 OT Time Calculation (min): 34 min Charges:  OT General Charges $OT Visit: 1 Procedure OT Evaluation $Initial OT Evaluation Tier I: 1 Procedure OT Treatments $Therapeutic Activity: 8-22 mins G-Codes:    [name] 01/09/2015, 9:45 AM  Nicholas OtterMaryellen Graycee [name], OTR/L [phone] 01/09/2015

## 2015-01-09 NOTE — Progress Notes (Signed)
ANTICOAGULATION CONSULT NOTE - Initial Consult  Pharmacy Consult for Heparin Indication: DVT  No Known Allergies  Patient Measurements: Height: 5\' 11"  (180.3 cm) Weight: (!) 347 lb (157.398 kg) IBW/kg (Calculated) : 75.3 Heparin Dosing Weight: 102.9 kg  Vital Signs: Temp: 99.5 F (37.5 C) (05/04 1148) Temp Source: Oral (05/04 1148) BP: 97/53 mmHg (05/04 1330) Pulse Rate: 77 (05/04 1330)  Labs:  Recent Labs  01/06/15 2010  01/07/15 0410 01/08/15 0350 01/09/15 0515  HGB  --   < > 14.3 13.4 12.6*  HCT  --   --  48.0 43.4 40.7  PLT  --   --  116* 120* 142*  CREATININE  --   --  3.41* 4.10* 5.51*  TROPONINI 0.36*  --   --   --   --   < > = values in this interval not displayed.  Estimated Creatinine Clearance: 21.5 mL/min (by C-G formula based on Cr of 5.51).   Medical History: Past Medical History  Diagnosis Date  . Arthritis   . RCT (rotator cuff tear)   . Multiple abrasions     rt arm  . Morbid obesity     Medications:  Scheduled:  . alteplase  2 mg Intracatheter Once  . antiseptic oral rinse  7 mL Mouth Rinse QID  . aspirin  325 mg Oral Daily  . budesonide  0.5 mg Nebulization BID  . ceFEPime (MAXIPIME) IV  2 g Intravenous Q24H  . chlorhexidine  1 application Topical Once  . chlorhexidine  1 application Topical Once  . chlorhexidine  15 mL Mouth Rinse BID  . famotidine  20 mg Per Tube BID  . feeding supplement (PRO-STAT SUGAR FREE 64)  60 mL Oral QID  . feeding supplement (VITAL HIGH PROTEIN)  1,000 mL Per Tube Q24H  . folic acid  1 mg Per Tube Daily  . free water  200 mL Per Tube 3 times per day  . heparin subcutaneous  5,000 Units Subcutaneous 3 times per day  . insulin aspart  0-15 Units Subcutaneous 6 times per day  . insulin glargine  10 Units Subcutaneous BID  . ipratropium-albuterol  3 mL Nebulization Q6H  . sodium chloride  10-40 mL Intracatheter Q12H   Infusions:  . sodium chloride 10 mL/hr at 01/08/15 1500  . fentaNYL infusion  INTRAVENOUS 50 mcg/hr (01/09/15 1400)  . phenylephrine (NEO-SYNEPHRINE) Adult infusion 80 mcg/min (01/09/15 1400)  . dialysis replacement fluid (prismasate)    . dialysis replacement fluid (prismasate)    . dialysate (PRISMASATE)      Assessment: 64yo M originally admitted for R rotator cuff repair. Developed VDRF 4/23. Developed ARF and plan is to start CRRT. US for HD catheter placement revealed bilateral femoral vein DVTs. Pharmacy is asked to start heparin infusion. HD cath was placed today. Patient has been on heparin 5000 units sq q8h for vte ppx, last dose given at 1400 today.  No baseline coags this admission.   CBC okay. Pltc low but improving last couple days.    Goal of Therapy:  Heparin level 0.3-0.7 units/ml Monitor platelets by anticoagulation protocol: Yes   Plan:  Draw baseline aPTT and PT/INR. Start heparin infusion at 1600units/hr. No bolus.  Check heparin level in 8hrs and daily thereafter. Check CBC q24h while on heparin. F/u daily.  Charolotte Ekeom Arnetta Odeh, PharmD, pager [phone]. 01/09/2015,2:37 PM.

## 2015-01-09 NOTE — Progress Notes (Signed)
PULMONARY / CRITICAL CARE MEDICINE   Name: [name] MRN: [id number] DOB: September 15, 1950    ADMISSION DATE:  12/28/2014 CONSULTATION DATE:  12/28/14   REFERRING MD :  Imogene BurnJenkins/TRH  PT PROFILE:  [id number] morbidly obese male admitted 4/22 by Ortho for elective R rotator cuff tear. Intubated early 4/23 AM for post operative hypercarbic/hypoxic resp failure.  STUDIES:  4/26  ECHO >> nml LV, EF 60-65%, Grade 1 DD, trivial pericardial effusion without evidence of tamponade 4/27 us testes with masses 5/2 renal US >> no hnosis, Possible nonobstructing stone lower pole left kidney  MAJOR EVENTS/TEST RESULTS: 4/22  elective R shoulder surgery 4/23  reintubated for hypercarbic/hypoxic resp failure 4/23  US scrotum: Two solid extratesticular right scrotal masses. Primary differential diagnosis for extratesticular masses include primary or metastatic neoplasms including adenomatoid tumor, fibroma, leiomyoma, mesothelioma, or sarcoma. Urology consultation is recommended  4/24  MV support, 8 PEEP, 80%.  Sedate.  4/26  MV support, PEEP 8, 60% 4/27  PEEP 8, 50%.  Tolerating TF, residuals improved. Net neg 2L 4/30 PEEP 10, 70%   INDWELLING DEVICES:: ETT 4/23 >>  L radial A-line 4/23 >> 4/25 PICC LUE 4/23 >>   MICRO DATA: PCT 4/23: 0.69, 4/24: 0.44, 4/25: 0.46 Urine 4/23 >> neg Resp 4/23 >> neg  4/27 bc>>ngtd>> 4/27 uc>>neg 4/27 sputum>>ngtd>>    ANTIMICROBIALS:  Cefazolin 4/22 >> 4/23 Levofloxacin 4/23 >> 4/27 4/27 vanc>>5/2 4/27 cefepime>>   SUBJECTIVE:   Remains lethargic  Poor UO off lasix Remains  febrile  VITAL SIGNS: Temp:  [99.2 F (37.3 C)-101.2 F (38.4 C)] 101.2 F (38.4 C) (05/04 0400) Pulse Rate:  [31-85] 79 (05/04 0800) Resp:  [14-23] 18 (05/04 0800) BP: (82-120)/(40-61) 100/54 mmHg (05/04 0800) SpO2:  [91 %-98 %] 95 % (05/04 0800) FiO2 (%):  [30 %-50 %] 40 % (05/04 0800) Weight:  [347 lb (157.398 kg)] 347 lb (157.398 kg) (05/04 0400)   HEMODYNAMICS:      VENTILATOR SETTINGS: Vent Mode:  [-] PRVC FiO2 (%):  [30 %-50 %] 40 % Set Rate:  [14 bmp] 14 bmp Vt Set:  [600 mL] 600 mL PEEP:  [8 cmH20-10 cmH20] 8 cmH20 Plateau Pressure:  [18 cmH20-24 cmH20] 18 cmH20   INTAKE / OUTPUT:  Intake/Output Summary (Last 24 hours) at 01/09/15 [id number] Last data filed at 01/09/15 0500  Gross per 24 hour  Intake 2067.54 ml  Output    485 ml  Net 1582.54 ml    PHYSICAL EXAMINATION: General: Morbidly obese male on vent in NAD Neuro: Sedate, does not follow commands  HEENT: NCAT, very short, thick neck Cardiovascular: Reg, no M Lungs: even/non-labored, lungs bilaterally distant but clear Abdomen:  Obese, +BS, soft Ext: R shoulder surgical dressing, no edema, sling in place  LABS:  CBC  Recent Labs Lab 01/07/15 0410 01/08/15 0350 01/09/15 0515  WBC 18.0* 15.6* 14.3*  HGB 14.3 13.4 12.6*  HCT 48.0 43.4 40.7  PLT 116* 120* 142*   Coag's No results for input(s): APTT, INR in the last 168 hours.   BMET  Recent Labs Lab 01/07/15 0410 01/08/15 0350 01/09/15 0515  NA 146* 143 144  K 3.9 4.0 4.1  CL 106 103 102  CO2 30 28 27   BUN 129* 165* 194*  CREATININE 3.41* 4.10* 5.51*  GLUCOSE 202* 193* 195*   Electrolytes  Recent Labs Lab 01/04/15 0350  01/06/15 1504 01/07/15 0410 01/08/15 0350 01/09/15 0515  CALCIUM  --   < > 9.1 8.8* 8.6* 8.8*  MG  2.4  --  2.8*  --   --   --   PHOS 2.6  --   --   --   --   --   < > = values in this interval not displayed. Sepsis Markers  Recent Labs Lab 01/06/15 0430 01/06/15 0738  LATICACIDVEN 1.8 1.8   ABG  Recent Labs Lab 01/04/15 0406 01/05/15 0500 01/06/15 0440  PHART 7.402 7.386 7.434  PCO2ART 62.6* 64.7* 49.3*  PO2ART 87.3 81.4 95.8   Liver Enzymes No results for input(s): AST, ALT, ALKPHOS, BILITOT, ALBUMIN in the last 168 hours. Cardiac Enzymes  Recent Labs Lab 01/06/15 0738 01/06/15 1346 01/06/15 2010  TROPONINI 0.48* 0.39* 0.36*   Glucose  Recent Labs Lab  01/08/15 0748 01/08/15 1143 01/08/15 1528 01/08/15 2005 01/08/15 2350 01/09/15 0401  GLUCAP 198* 173* 170* 175* 189* 187*    CXR: 5/4 - cardiomegaly, LLL atx vs consolidation    ASSESSMENT / PLAN:  PULMONARY A:  Acute Hypercarbic / Hypoxemic Resp Failure RLL ATX - improved, LL atx 4/30 s/p bronchoscopy 5/2 Pulmonary Edema - suspect in setting of dCHF 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 (Hgb 18, serum CO2 32) Likely OHS/OSA Anticipate difficult airway due to body habitus COPD  P:   Cont MV support, 8 cc/kg PEEP/  FiO2 for sats > 90% per ARDS protocol Start SBTs on PEEP 8 Pulmicort Q12, duonebs Plan for trach 5/4 -anesthesia would like to hold off today Chest PT  CARDIOVASCULAR A:  Hypotension - resolved. Minimal elevation of trop I - doubt ACS Hx HTN Hypertriglyceridemia  Diastolic Dysfunction - grade 1 on ECHO 4/26 VT - episode x 2 4/26 P:  Monitor BP and rhythm PRN hydralazine  ASA 325 QD HOLD ACE, hold metoprolol until BP picks up  RENAL/GU A:  Hypernatremia , AKI 4/30 , appears ATN Scrotal mass P:   Monitor BMET intermittently Monitor I/Os Correct electrolytes as indicated Urology consultation after acute critical phase of illness resolves for scrotal mass Free water 100 Q8 Hold lasix Renal consulting  GASTROINTESTINAL A:  Obesity Vent Associated Dysphagia - 4/28 high residuals for TF Off reglan due to VT episodes  P:   SUP: enteral famotidine Cont TFs Monitor residuals   HEMATOLOGIC A:   Polycythemia - likely chronic hypoxemia Macrocytosis  P:  DVT px: SQ heparin Monitor CBC intermittently Transfuse per usual ICU guidelines Folate  INFECTIOUS A:   HCAP /atelectasis Persistent fever P:  4/27 bc>>ngtd 4/27 uc>>ng 4/27 sputum>>ngtd 4/27 vanc>>5/2 4/27 cefepime>>  ENDOCRINE CBG (last 3)   Recent Labs  01/08/15 2005 01/08/15 2350 01/09/15 0401  GLUCAP 175* 189* 187*     A:   Hyperglycemia,  controlled   P:   SSI  Q4 CBG 4/29 added  lantus   NEUROLOGIC A:  Acute Hypercarbic Encephalopathy Post op pain s/p R rotator cuff surgery ICU associated discomfort P:   RASS goal: -1 Off propofol , OK to use  fentanyl gtt  post-op care per Ortho  Daily WUA    GLOBAL: updated brother 5/2 am  Summary  - suspect HCAP with LLL atelectasis,now complicated by AKI Plan for  ENT tracheostomy -Ok to proceed from oxygenation standpt  The patient is critically ill with multiple organ systems failure and requires high complexity decision making for assessment and support, frequent evaluation and titration of therapies, application of advanced monitoring technologies and extensive interpretation of multiple databases. Critical Care Time devoted to patient care services described in this note independent of  APP time is 35 minutes.   Cyril Mourning MD. Tonny Bollman. [facility] Pager [phone] If no response call [phone]     01/09/2015, 8:22 AM

## 2015-01-10 ENCOUNTER — Inpatient Hospital Stay (HOSPITAL_COMMUNITY): Payer: Worker's Compensation

## 2015-01-10 LAB — RENAL FUNCTION PANEL
ALBUMIN: 2.4 g/dL — AB (ref 3.5–5.0)
Albumin: 2.5 g/dL — ABNORMAL LOW (ref 3.5–5.0)
Anion gap: 13 (ref 5–15)
Anion gap: 11 (ref 5–15)
BUN: 165 mg/dL — ABNORMAL HIGH (ref 6–20)
BUN: 125 mg/dL — AB (ref 6–20)
CALCIUM: 8.3 mg/dL — AB (ref 8.9–10.3)
CO2: 26 mmol/L (ref 22–32)
CO2: 26 mmol/L (ref 22–32)
CREATININE: 4.71 mg/dL — AB (ref 0.61–1.24)
CREATININE: 3.89 mg/dL — AB (ref 0.61–1.24)
Calcium: 8.3 mg/dL — ABNORMAL LOW (ref 8.9–10.3)
Chloride: 102 mmol/L (ref 101–111)
Chloride: 102 mmol/L (ref 101–111)
GFR calc Af Amer: 18 mL/min — ABNORMAL LOW (ref >60)
GFR calc non Af Amer: 15 mL/min — ABNORMAL LOW (ref >60)
GFR, EST AFRICAN AMERICAN: 14 mL/min — AB (ref >60)
GFR, EST NON AFRICAN AMERICAN: 12 mL/min — AB (ref >60)
Glucose, Bld: 154 mg/dL — ABNORMAL HIGH (ref 70–99)
Glucose, Bld: 177 mg/dL — ABNORMAL HIGH (ref 70–99)
PHOSPHORUS: 6 mg/dL — AB (ref 2.5–4.6)
Phosphorus: 6.7 mg/dL — ABNORMAL HIGH (ref 2.5–4.6)
Potassium: 4 mmol/L (ref 3.5–5.1)
Potassium: 4.5 mmol/L (ref 3.5–5.1)
SODIUM: 141 mmol/L (ref 135–145)
Sodium: 139 mmol/L (ref 135–145)

## 2015-01-10 LAB — BASIC METABOLIC PANEL
ANION GAP: 15 (ref 5–15)
BUN: 162 mg/dL — ABNORMAL HIGH (ref 6–20)
CO2: 25 mmol/L (ref 22–32)
CREATININE: 4.61 mg/dL — AB (ref 0.61–1.24)
Calcium: 8.3 mg/dL — ABNORMAL LOW (ref 8.9–10.3)
Chloride: 101 mmol/L (ref 101–111)
GFR calc non Af Amer: 12 mL/min — ABNORMAL LOW (ref >60)
GFR, EST AFRICAN AMERICAN: 14 mL/min — AB (ref >60)
Glucose, Bld: 152 mg/dL — ABNORMAL HIGH (ref 70–99)
Potassium: 4 mmol/L (ref 3.5–5.1)
Sodium: 141 mmol/L (ref 135–145)

## 2015-01-10 LAB — GLUCOSE, CAPILLARY
GLUCOSE-CAPILLARY: 134 mg/dL — AB (ref 70–99)
GLUCOSE-CAPILLARY: 140 mg/dL — AB (ref 70–99)
GLUCOSE-CAPILLARY: 146 mg/dL — AB (ref 70–99)
Glucose-Capillary: 133 mg/dL — ABNORMAL HIGH (ref 70–99)
Glucose-Capillary: 178 mg/dL — ABNORMAL HIGH (ref 70–99)

## 2015-01-10 LAB — CBC
HCT: 38.5 % — ABNORMAL LOW (ref 39.0–52.0)
HEMOGLOBIN: 12.3 g/dL — AB (ref 13.0–17.0)
MCH: 31.2 pg (ref 26.0–34.0)
MCHC: 31.9 g/dL (ref 30.0–36.0)
MCV: 97.7 fL (ref 78.0–100.0)
PLATELETS: 196 10*3/uL (ref 150–400)
RBC: 3.94 MIL/uL — AB (ref 4.22–5.81)
RDW: 16.5 % — ABNORMAL HIGH (ref 11.5–15.5)
WBC: 12.3 10*3/uL — ABNORMAL HIGH (ref 4.0–10.5)

## 2015-01-10 LAB — HEPARIN LEVEL (UNFRACTIONATED)
HEPARIN UNFRACTIONATED: 0.47 [IU]/mL (ref 0.30–0.70)
Heparin Unfractionated: 0.41 IU/mL (ref 0.30–0.70)

## 2015-01-10 LAB — TRIGLYCERIDES: Triglycerides: 256 mg/dL — ABNORMAL HIGH (ref <150)

## 2015-01-10 LAB — LACTATE DEHYDROGENASE: LDH: 390 U/L — ABNORMAL HIGH (ref 98–192)

## 2015-01-10 LAB — MAGNESIUM: Magnesium: 3.5 mg/dL — ABNORMAL HIGH (ref 1.7–2.4)

## 2015-01-10 MED ORDER — BISACODYL 10 MG RE SUPP
10.0000 mg | Freq: Once | RECTAL | Status: AC
  Administered 2015-01-10: 10 mg via RECTAL
  Filled 2015-01-10: qty 1

## 2015-01-10 MED ORDER — FAMOTIDINE 20 MG PO TABS
20.0000 mg | ORAL_TABLET | Freq: Every day | ORAL | Status: DC
  Administered 2015-01-11 – 2015-01-13 (×3): 20 mg
  Filled 2015-01-10 (×3): qty 1

## 2015-01-10 MED ORDER — IPRATROPIUM-ALBUTEROL 0.5-2.5 (3) MG/3ML IN SOLN
3.0000 mL | RESPIRATORY_TRACT | Status: DC | PRN
  Administered 2015-01-11: 3 mL via RESPIRATORY_TRACT
  Filled 2015-01-10: qty 3

## 2015-01-10 MED ORDER — PROPOFOL 1000 MG/100ML IV EMUL
0.0000 ug/kg/min | INTRAVENOUS | Status: DC
  Administered 2015-01-10 (×3): 20 ug/kg/min via INTRAVENOUS
  Administered 2015-01-11: 25 ug/kg/min via INTRAVENOUS
  Administered 2015-01-11: 20 ug/kg/min via INTRAVENOUS
  Administered 2015-01-11 – 2015-01-12 (×3): 25 ug/kg/min via INTRAVENOUS
  Administered 2015-01-12: 25.133 ug/kg/min via INTRAVENOUS
  Administered 2015-01-12 – 2015-01-13 (×7): 25 ug/kg/min via INTRAVENOUS
  Administered 2015-01-13: 50 ug/kg/min via INTRAVENOUS
  Filled 2015-01-10 (×2): qty 100
  Filled 2015-01-10 (×2): qty 200
  Filled 2015-01-10: qty 100
  Filled 2015-01-10: qty 400
  Filled 2015-01-10: qty 100
  Filled 2015-01-10: qty 300
  Filled 2015-01-10 (×2): qty 100
  Filled 2015-01-10: qty 300

## 2015-01-10 MED ORDER — FENTANYL BOLUS VIA INFUSION
50.0000 ug | INTRAVENOUS | Status: DC | PRN
  Filled 2015-01-10: qty 50

## 2015-01-10 MED ORDER — MIDAZOLAM HCL 2 MG/2ML IJ SOLN
2.0000 mg | INTRAMUSCULAR | Status: DC | PRN
  Filled 2015-01-10: qty 2

## 2015-01-10 MED ORDER — ACETAMINOPHEN 160 MG/5ML PO SOLN: 650.0000 mg | Freq: Four times a day (QID) | ORAL | Status: DC | PRN

## 2015-01-10 MED ORDER — MIDAZOLAM HCL 2 MG/2ML IJ SOLN
2.0000 mg | INTRAMUSCULAR | Status: DC | PRN
  Administered 2015-01-10: 2 mg via INTRAVENOUS
  Filled 2015-01-10: qty 2

## 2015-01-10 MED ORDER — SODIUM CHLORIDE 0.9 % IV SOLN
25.0000 ug/h | INTRAVENOUS | Status: DC
  Administered 2015-01-10: 100 ug/h via INTRAVENOUS
  Administered 2015-01-11 – 2015-01-12 (×2): 150 ug/h via INTRAVENOUS
  Administered 2015-01-13: 200 ug/h via INTRAVENOUS
  Filled 2015-01-10 (×5): qty 50

## 2015-01-10 NOTE — Progress Notes (Signed)
ANTICOAGULATION & ANTIBIOTIC CONSULT NOTE - Follow Up Consult  Pharmacy Consult for Heparin; Cefepime Indication: DVT; HCAP  No Known Allergies  Patient Measurements: Height: 5\' 11"  (180.3 cm) Weight: (!) 345 lb (156.491 kg) IBW/kg (Calculated) : 75.3 Heparin Dosing Weight: 102.9 kg  Vital Signs: Temp: 98.4 F (36.9 C) (05/05 0400) Temp Source: Oral (05/05 0400) BP: 97/52 mmHg (05/05 0705) Pulse Rate: 64 (05/05 0705)  Labs:  Recent Labs  01/08/15 0350 01/09/15 0515 01/09/15 1510 01/09/15 1600 01/09/15 2328 01/10/15 0527  HGB 13.4 12.6*  --   --   --  12.3*  HCT 43.4 40.7  --   --   --  38.5*  PLT 120* 142*  --   --   --  196  APTT  --   --  25  --   --   --   LABPROT  --   --  14.0  --   --   --   INR  --   --  1.07  --   --   --   HEPARINUNFRC  --   --   --   --  0.47 0.41  CREATININE 4.10* 5.51*  --  6.22*  --  4.71*    Estimated Creatinine Clearance: 25.1 mL/min (by C-G formula based on Cr of 4.71).   Medications:  Infusions:  . sodium chloride 10 mL/hr at 01/09/15 1900  . fentaNYL infusion INTRAVENOUS 100 mcg/hr (01/10/15 0610)  . heparin 1,600 Units/hr (01/10/15 [id number])  . phenylephrine (NEO-SYNEPHRINE) Adult infusion 70 mcg/min (01/10/15 0700)  . dialysis replacement fluid (prismasate) 400 mL/hr at 01/09/15 2000  . dialysis replacement fluid (prismasate) 200 mL/hr at 01/09/15 2000  . dialysate (PRISMASATE) 2,000 mL/hr at 01/10/15 0346   Anti-infectives    Start     Dose/Rate Route Frequency Ordered Stop   01/10/15 0000  ceFEPIme (MAXIPIME) 2 g in dextrose 5 % 50 mL IVPB     2 g 100 mL/hr over 30 Minutes Intravenous Every 12 hours 01/09/15 1829     01/07/15 1000  ceFEPIme (MAXIPIME) 2 g in dextrose 5 % 50 mL IVPB  Status:  Discontinued     2 g 100 mL/hr over 30 Minutes Intravenous Every 24 hours 01/06/15 1148 01/09/15 1829   01/03/15 0600  vancomycin (VANCOCIN) 1,500 mg in sodium chloride 0.9 % 500 mL IVPB  Status:  Discontinued     1,500 mg 250 mL/hr  over 120 Minutes Intravenous Every 12 hours 01/02/15 1748 01/06/15 1151   01/02/15 1800  vancomycin (VANCOCIN) 2,500 mg in sodium chloride 0.9 % 500 mL IVPB     2,500 mg 250 mL/hr over 120 Minutes Intravenous NOW 01/02/15 1748 01/02/15 2106   01/02/15 1800  ceFEPIme (MAXIPIME) 2 g in dextrose 5 % 50 mL IVPB  Status:  Discontinued     2 g 100 mL/hr over 30 Minutes Intravenous Every 8 hours 01/02/15 1748 01/06/15 1148   12/29/14 1400  levofloxacin (LEVAQUIN) IVPB 750 mg  Status:  Discontinued     750 mg 100 mL/hr over 90 Minutes Intravenous Every 24 hours 12/29/14 1341 01/02/15 0950   12/28/14 2000  ceFAZolin (ANCEF) IVPB 2 g/50 mL premix     2 g 100 mL/hr over 30 Minutes Intravenous Every 6 hours 12/28/14 1856 12/29/14 1004   12/28/14 0600  ceFAZolin (ANCEF) 3 g in dextrose 5 % 50 mL IVPB     3 g 160 mL/hr over 30 Minutes Intravenous On call to O.R. 12/27/14  1339 12/28/14 1353   12/28/14 0000  cephALEXin (KEFLEX) 500 MG capsule     500 mg Oral 4 times daily 12/28/14 1604        Assessment: 63 YOM on IV heparin for bilateral DVTs. Heparin level is therapeutic on 1600 units/hr. CBC stable - platelets improved.   Patient is also on Cefepime on day # 9 for HCAP. Patient has had persistent fevers - Weiand Tmax is 99.5 on CRRT. WBC is trending down. Patient started CRRT 5/4 at 2000 PM.   Goal of Therapy:  Heparin level 0.3-0.7 units/ml Monitor platelets by anticoagulation protocol: Yes Clinical resolution of infection  Plan:  Continue heparin at 1600 units/hr.  Daily heparin level and CBC.  Monitor for signs and symptoms of bleeding. Continue Cefepime at 2g IV q12 - dosed appropriately for CRRT.  Consider duration of therapy as day 9 for HCAP.   Link SnufferJessica Anne-Marie Genson, PharmD, BCPS Clinical Pharmacist [phone] 01/10/2015,7:25 AM

## 2015-01-10 NOTE — Progress Notes (Signed)
PULMONARY / CRITICAL CARE MEDICINE   Name: [name] MRN: [id number] DOB: Jan 21, 1951    ADMISSION DATE:  12/28/2014 CONSULTATION DATE:  12/28/14   REFERRING MD :  [name]  PT PROFILE:  64 morbidly obese male admitted 4/22 by Ortho for elective R rotator cuff tear. Intubated early 4/23 AM for post operative hypercarbic/hypoxic resp failure.  STUDIES:  4/26 ECHO >> nml LV, EF 60-65%, Grade 1 DD, trivial pericardial effusion without evidence of tamponade 4/27 [place] testes with masses 5/02 renal US >> no hydronephrosis, Possible nonobstructing stone lower pole left kidney 5/04 doppler legs >> b/l DVT  MAJOR EVENTS: 4/22  elective R shoulder surgery 4/23  reintubated for hypercarbic/hypoxic resp failure 4/23  US scrotum: Two solid extratesticular right scrotal masses. Primary differential diagnosis for extratesticular masses include primary or metastatic neoplasms including adenomatoid tumor, fibroma, leiomyoma, mesothelioma, or sarcoma. Urology consultation is recommended  5/04 Transfer to [facility] for CRRT  SUBJECTIVE:   Tolerating PS 10/5.  VITAL SIGNS: Temp:  [98.3 F (36.8 C)-99.5 F (37.5 C)] 98.4 F (36.9 C) (05/05 0400) Pulse Rate:  [63-88] 64 (05/05 0705) Resp:  [14-24] 17 (05/05 0705) BP: (81-130)/(42-76) 97/52 mmHg (05/05 0705) SpO2:  [89 %-97 %] 97 % (05/05 0705) FiO2 (%):  [40 %] 40 % (05/05 0706) Weight:  [345 lb (156.491 kg)-352 lb 11.8 oz (160 kg)] 345 lb (156.491 kg) (05/05 0600)   VENTILATOR SETTINGS: Vent Mode:  [-] PRVC FiO2 (%):  [40 %] 40 % Set Rate:  [14 bmp] 14 bmp Vt Set:  [600 mL] 600 mL PEEP:  [8 cmH20] 8 cmH20 Pressure Support:  [10 cmH20] 10 cmH20 Plateau Pressure:  [23 cmH20-27 cmH20] 24 cmH20   INTAKE / OUTPUT:  Intake/Output Summary (Last 24 hours) at 01/10/15 0758 Last data filed at 01/10/15 0700  Gross per 24 hour  Intake 1776.2 ml  Output   1623 ml  Net  153.2 ml    PHYSICAL EXAMINATION: General: no distress Neuro: RASS  -1 HEENT: ETT in place Cardiovascular: regular Lungs: no wheeze Abdomen: soft, non tender Ext: R shoulder surgical dressing, no edema, sling in place  LABS:  CBC  Recent Labs Lab 01/08/15 0350 01/09/15 0515 01/10/15 0527  WBC 15.6* 14.3* 12.3*  HGB 13.4 12.6* 12.3*  HCT 43.4 40.7 38.5*  PLT 120* 142* 196   Coag's  Recent Labs Lab 01/09/15 1510  APTT 25  INR 1.07     BMET  Recent Labs Lab 01/09/15 0515 01/09/15 1600 01/10/15 0527  NA 144 141 141  K 4.1 4.0 4.0  CL 102 99* 102  CO2 BUN 194* 226* 165*  CREATININE 5.51* 6.22* 4.71*  GLUCOSE 195* 187* 154*   Electrolytes  Recent Labs Lab 01/04/15 0350  01/06/15 1504  01/09/15 0515 01/09/15 1600 01/10/15 0527  CALCIUM  --   < > 9.1  < > 8.8* 8.7* 8.3*  MG 2.4  --  2.8*  --   --   --   --   PHOS 2.6  --   --   --   --  8.0* 6.0*  < > = values in this interval not displayed.   Sepsis Markers  Recent Labs Lab 01/06/15 0430 01/06/15 0738  LATICACIDVEN 1.8 1.8   ABG  Recent Labs Lab 01/04/15 0406 01/05/15 0500 01/06/15 0440  PHART 7.402 7.386 7.434  PCO2ART 62.6* 64.7* 49.3*  PO2ART 87.3 81.4 95.8   Liver Enzymes  Recent Labs Lab 01/09/15 1600 01/10/15 0527  ALBUMIN 2.4* 2.4*   Cardiac Enzymes  Recent Labs Lab 01/06/15 0738 01/06/15 1346 01/06/15 2010  TROPONINI 0.48* 0.39* 0.36*   Glucose  Recent Labs Lab 01/09/15 0752 01/09/15 1146 01/09/15 1707 01/09/15 1951 01/09/15 2331 01/10/15 0343  GLUCAP 157* 159* 184* 191* 146* 134*    CXR:  Dg Abd 1 View  01/09/2015   CLINICAL DATA:  64 year old male enteric tube placement. Initial encounter.  EXAM: ABDOMEN - 1 VIEW  COMPARISON:  Portable chest radiograph 0756 hours today.  FINDINGS: Portable AP supine view at 1135 hours. Enteric tube courses to the stomach. Side hole projects at the mid gastric body level. Negative visible bowel gas pattern.  IMPRESSION: Enteric tube within the stomach, side hole at the gastric  body level.   Electronically Signed   By: Odessa FlemingH  Hall M.D.   On: 01/09/2015 11:53   Dg Chest Port 1 View  01/10/2015   CLINICAL DATA:  Acute respiratory failure  EXAM: PORTABLE CHEST - 1 VIEW  COMPARISON:  01/09/2015  FINDINGS: Endotracheal tube is 4.3 cm above the carina. The nasogastric tube extends below the diaphragm and off the inferior edge of the image. There is a left upper extremity PICC line extending into the SVC. There is unchanged cardiomegaly. There has been slow improvement over the past several examinations, now with no confluent airspace opacity and no significant effusion. Pulmonary vasculature is normal.  IMPRESSION: Support equipment appears satisfactorily positioned.  Improved.   Electronically Signed   By: Ellery Plunkaniel R Mitchell M.D.   On: 01/10/2015 05:26   Dg Chest Port 1 View  01/09/2015   CLINICAL DATA:  64 year old male with respiratory failure. Initial encounter.  EXAM: PORTABLE CHEST - 1 VIEW  COMPARISON:  01/08/2015 and earlier.  FINDINGS: Portable AP semi upright view at 0756 hours. Endotracheal tube tip is in good position between the clavicles and carina. Visible enteric tube is stable. Left subclavian line or upper extremity PICC line is stable.  Stable cardiac size and mediastinal contours. Stable lung volumes. No significant change in the degree of ventilation since 01/06/2015 with confluent opacity at the left lung base, and increased interstitial markings elsewhere. No interval worsening ventilation.  IMPRESSION: 1.  Stable lines and tubes. 2. No significant change in ventilation since 01/06/2015 with confluent left lung base opacity superimposed on widespread interstitial opacity.   Electronically Signed   By: Odessa FlemingH  Hall M.D.   On: 01/09/2015 08:14      ASSESSMENT / PLAN:  PULMONARY A:  Acute hypoxic/hypercapnic respiratory. Possible OSA/OHS. Tobacco abuse. P:   Pressure support wean as tolerated  Scheduled for Trach with ENT >> ?if he should have extubation trial  first F/u CXR D/c pulmicort Change BD's to prn  CARDIOVASCULAR A:  Hx of HTN and chronic diastolic heart failure. Elevated troponin >> demand ischemia. Hx of HLD. VT episode 4/26. B/l lower extremity DVT and probable PE. P:  Continue ASA Monitor hemodynamics Heparin gtt for now  RENA A:   AKI 2nd to ATN. Scrotal mass. P:   CRRT per renal Will need CT chest/abd/pelvis and urology evaluation when more stable Check AFP, HCG, LDH  GASTROINTESTINAL A:  Nutrition. P:   Tube feeds Pepcid for SUP  HEMATOLOGIC A:   Polycythemia - likely chronic hypoxemia >> improved. P:  F/u CBC  INFECTIOUS A:   HCAP. P:  Day 13 of Abx, day 9 of cefepime  Cultures negative >> will d/c Abx 5/05  ENDOCRINE A:   Hyperglycemia. P:   SSI  NEUROLOGIC A:  Acute metabolic encephalopathy. P:   RASS goal 0 PRN fentanyl for pain control  ORTHO A: Rt shoulder surgery 4/22. P: Per ortho  CC time 35 minutes.  Coralyn HellingVineet Panayiotis [place], MD [facility] 01/10/2015, 8:17 AM Pager:  [phone] After 3pm call: [phone]

## 2015-01-10 NOTE — Progress Notes (Signed)
[facility] Progress Note   Subjective:  Neo gtt at 70 mcg /min, UOP 940 cc yesterday  Filed Vitals:   01/10/15 0615 01/10/15 0700 01/10/15 0705 01/10/15 0801  BP: 96/62 97/52 97/52    Pulse: 66 63 64   Temp:    98.5 F (36.9 C)  TempSrc:    Oral  Resp: 17 17 17    Height:      Weight:      SpO2: 95% 97% 97%    Exam: On vent, eyes open No jvd Chest clear ant and lat RRR no rub or gallop Abd very obese, ND NT +BS x 4 GU foley in place Ext mild-moderate diffuse edema of LE's and UE's Neuro is responsive  Renal US 11-13 cm kidneys , no hydro UA 5.0, 1.020, prot 30, rbc 21-50/hpf, wbc 0-2/hpf UNa 30, UCreat 230 CXR 5/4 mild vasc congestion, L basilar process no chg  Assessment: 1. AKI - ATN due to shock, nonoliguric, cont CRRT day #1  2. Shock / fevers - all cx's negative, on pressors. 13 days of abx, dc'd today 3. S/P R rotator cuff repair 4/22 4. Morbid obesity 5. VDRF 6. Bilat LE DVT / suspected PE - on IV hep, suspected PE  Plan - CRRT, no heparin, keeping even    Vinson Moselleob Jasn Xia MD  pager [phone]    cell [phone]  01/10/2015, 8:46 AM     Recent Labs Lab 01/04/15 0350  01/09/15 0515 01/09/15 1600 01/10/15 0527  NA  --   < > 144 141 141  K  --   < > 4.1 4.0 4.0  CL  --   < > 102 99* 102  CO2  --   < > 27 25 26   GLUCOSE  --   < > 195* 187* 154*  BUN  --   < > 194* 226* 165*  CREATININE  --   < > 5.51* 6.22* 4.71*  CALCIUM  --   < > 8.8* 8.7* 8.3*  PHOS 2.6  --   --  8.0* 6.0*  < > = values in this interval not displayed.  Recent Labs Lab 01/09/15 1600 01/10/15 0527  ALBUMIN 2.4* 2.4*    Recent Labs Lab 01/08/15 0350 01/09/15 0515 01/10/15 0527  WBC 15.6* 14.3* 12.3*  HGB 13.4 12.6* 12.3*  HCT 43.4 40.7 38.5*  MCV 102.6* 100.5* 97.7  PLT 120* 142* 196   . alteplase  2 mg Intracatheter Once  . antiseptic oral rinse  7 mL Mouth Rinse QID  . aspirin  325 mg Oral Daily  . chlorhexidine  15 mL Mouth Rinse BID  . famotidine   20 mg Per Tube BID  . feeding supplement (PRO-STAT SUGAR FREE 64)  60 mL Oral QID  . feeding supplement (VITAL HIGH PROTEIN)  1,000 mL Per Tube Q24H  . folic acid  1 mg Per Tube Daily  . insulin aspart  0-15 Units Subcutaneous 6 times per day  . insulin glargine  10 Units Subcutaneous BID  . sodium chloride  10-40 mL Intracatheter Q12H   . sodium chloride 10 mL/hr at 01/09/15 1900  . fentaNYL infusion INTRAVENOUS 100 mcg/hr (01/10/15 0610)  . heparin 1,600 Units/hr (01/10/15 [id number])  . phenylephrine (NEO-SYNEPHRINE) Adult infusion 70 mcg/min (01/10/15 0700)  . dialysis replacement fluid (prismasate) 400 mL/hr at 01/10/15 [id number]  . dialysis replacement fluid (prismasate) 200 mL/hr at 01/09/15 2000  . dialysate (PRISMASATE) 2,000 mL/hr at 01/10/15 0842   acetaminophen (TYLENOL) oral liquid 160  mg/5 mL, fentaNYL, fentaNYL (SUBLIMAZE) injection, heparin, hydrALAZINE, ipratropium-albuterol, lip balm, [DISCONTINUED] ondansetron **OR** ondansetron (ZOFRAN) IV, sennosides, sodium chloride

## 2015-01-10 NOTE — Progress Notes (Signed)
ANTICOAGULATION CONSULT NOTE -Follow up  Pharmacy Consult for Heparin Indication: DVT  Nicholas Known Allergies  Patient Measurements: Height: 5\' 11"  (180.3 cm) Weight: (!) 352 lb 11.8 oz (160 kg) IBW/kg (Calculated) : 75.3 Heparin Dosing Weight: 102.9 kg  Vital Signs: Temp: 98.4 F (36.9 C) (05/05 0000) Temp Source: Oral (05/05 0000) BP: 109/66 mmHg (05/05 0100) Pulse Rate: 72 (05/05 0100)  Labs:  Recent Labs  01/07/15 0410 01/08/15 0350 01/09/15 0515 01/09/15 1510 01/09/15 1600 01/09/15 2328  HGB 14.3 13.4 12.6*  --   --   --   HCT 48.0 43.4 40.7  --   --   --   PLT 116* 120* 142*  --   --   --   APTT  --   --   --  25  --   --   LABPROT  --   --   --  14.0  --   --   INR  --   --   --  1.07  --   --   HEPARINUNFRC  --   --   --   --   --  0.47  CREATININE 3.41* 4.10* 5.51*  --  6.22*  --     Estimated Creatinine Clearance: 19.3 mL/min (by C-G formula based on Cr of 6.22).   Medical History: Past Medical History  Diagnosis Date  . Arthritis   . RCT (rotator cuff tear)   . Multiple abrasions     rt arm  . Morbid obesity     Medications:  Scheduled:  . alteplase  2 mg Intracatheter Once  . antiseptic oral rinse  7 mL Mouth Rinse QID  . aspirin  325 mg Oral Daily  . budesonide  0.5 mg Nebulization BID  . ceFEPime (MAXIPIME) IV  2 g Intravenous Q12H  . chlorhexidine  15 mL Mouth Rinse BID  . famotidine  20 mg Per Tube BID  . feeding supplement (PRO-STAT SUGAR FREE 64)  60 mL Oral QID  . feeding supplement (VITAL HIGH PROTEIN)  1,000 mL Per Tube Q24H  . folic acid  1 mg Per Tube Daily  . free water  200 mL Per Tube 3 times per day  . insulin aspart  0-15 Units Subcutaneous 6 times per day  . insulin glargine  10 Units Subcutaneous BID  . ipratropium-albuterol  3 mL Nebulization Q6H  . sodium chloride  10-40 mL Intracatheter Q12H   Infusions:  . sodium chloride 10 mL/hr at 01/09/15 1900  . fentaNYL infusion INTRAVENOUS 100 mcg/hr (01/09/15 2100)  .  heparin 1,600 Units/hr (01/09/15 1900)  . phenylephrine (NEO-SYNEPHRINE) Adult infusion 60 mcg/min (01/09/15 2241)  . dialysis replacement fluid (prismasate) 400 mL/hr at 01/09/15 2000  . dialysis replacement fluid (prismasate) 200 mL/hr at 01/09/15 2000  . dialysate (PRISMASATE) 2,000 mL/hr at 01/10/15 0109    Assessment: 6 hour heparin level is therapeutic at 0.47 on heparin drip rate 1600 units/hr for bilateral femoral vein DVTs in this 63yo male. Nicholas bleeding noted.  Originally admitted for R rotator cuff repair. Developed VDRF 4/23. Developed ARF and plan is to start CRRT. US for HD catheter placement revealed bilateral femoral vein DVTs. HD cath was placed on 01/09/15.  Goal of Therapy:  Heparin level 0.3-0.7 units/ml Monitor platelets by anticoagulation protocol: Yes   Plan:  Continue  heparin infusion at 1600units/hr. Nicholas bolus.  Check daily 5am HL to confirm remains therapeutic. Daily Heparin level and CBC    [name], RPh  Clinical Pharmacist Pager: [phone]  01/10/2015,1:48 AM.

## 2015-01-10 NOTE — Progress Notes (Signed)
UR Completed.  [phone]  

## 2015-01-10 NOTE — Progress Notes (Signed)
Occupational Therapy Treatment Patient Details Name: [name] MRN: [id number] DOB: 1951/08/22 Today's Date: 01/10/2015    History of present illness [facility] or elective Rt RCR.  He was intubated 4/23 am for post op hypercarbic resp failure.  US testes show masses - work up to be one by urology; 5/4 dopplers showed bil. LE DVTs; 5[facility].  PMH includes morbid obesity, arthritis    OT comments  Pt tolerated PROM Rt elbow, wrist, and hand.  Abduction sling repositioned.  No evidence of skin compromise Rt elbow.   Follow Up Recommendations  Other (comment) (TBD - meical status liiting ability to deterine )    Equipment Recommendations  Other (comment) (TBD)    Recommendations for Other Services      Precautions / Restrictions Precautions Precautions: Fall;Shoulder;Other (comment) (ETT, CRRT, mulitple lines and tubes ) Type of Shoulder Precautions: Abduction sling in place at all ties.  PROM Rt elbow, wrist, and hand daily  Shoulder Interventions: Shoulder abduction pillow Restrictions Weight Bearing Restrictions: Yes RUE Weight Bearing: Non weight bearing       Mobility Bed Mobility                  Transfers                 General transfer comment: unable     Balance                                   ADL                                         General ADL Comments: Total A for ADLs.  Abduction pillow sling on pt, but Rt UE not in the sling. Sling repositioned in the appropriate place with Rt UE placed in the sling.  RN notified.  Skin mildly red Rt elbow with no signs of compromise.  No rubbing/irritation noted at chin from neck strap at this time.       Vision                     Perception     Praxis      Cognition   Behavior During Therapy: Restless Overall Cognitive Status: Difficult to assess                       Extremity/Trunk Assessment                Exercises General Exercises - Upper Extremity Elbow Flexion: PROM;Right;10 reps;Supine Elbow Extension: PROM;Right;10 reps;Supine Wrist Flexion: PROM;Right;10 reps;Supine Wrist Extension: PROM;Right;10 reps;Supine Digit Composite Flexion: PROM;Right;10 reps;Supine Composite Extension: PROM;Right;10 reps;Supine   Shoulder Instructions       General Comments      Pertinent Vitals/ Pain       Pain Assessment: Faces Faces Pain Scale: Hurts little more Pain Location: Rt UE with PROM of elbow Pain Descriptors / Indicators: Grimacing Pain Intervention(s): RN gave pain meds during session  Home Living                                          Prior Functioning/Environment  Frequency Min 6X/week     Progress Toward Goals  OT Goals(Otterson goals can now be found in the care plan section)  Progress towards OT goals: Progressing toward goals (Pt tolerated PROM )  ADL Goals Additional ADL Goal #1: pt will tolerate sling without skin compromise, as noted during PROM to elbow and wrist and RUE ADLs  Plan Other (comment) (TBD)    Co-evaluation                 End of Session Equipment Utilized During Treatment: Other (comment) (abduction sling Rt UE )   Activity Tolerance Other (comment) (medical complications - intubated, CRRT )   Patient Left     Nurse Communication Precautions;Other (comment) (sling fit )        Time: [id number] OT Time Calculation (min): 20 min  Charges: OT General Charges $OT Visit: 1 Procedure OT Treatments $Neuromuscular Re-education: 8-22 mins  Lincy Belles M 01/10/2015, 11:31 AM

## 2015-01-11 LAB — RENAL FUNCTION PANEL
ANION GAP: 13 (ref 5–15)
Albumin: 2.6 g/dL — ABNORMAL LOW (ref 3.5–5.0)
Albumin: 2.6 g/dL — ABNORMAL LOW (ref 3.5–5.0)
Anion gap: 11 (ref 5–15)
BUN: 75 mg/dL — ABNORMAL HIGH (ref 6–20)
BUN: 66 mg/dL — ABNORMAL HIGH (ref 6–20)
CO2: 25 mmol/L (ref 22–32)
CO2: 24 mmol/L (ref 22–32)
Calcium: 8.1 mg/dL — ABNORMAL LOW (ref 8.9–10.3)
Calcium: 8.2 mg/dL — ABNORMAL LOW (ref 8.9–10.3)
Chloride: 99 mmol/L — ABNORMAL LOW (ref 101–111)
Chloride: 100 mmol/L — ABNORMAL LOW (ref 101–111)
Creatinine, Ser: 2.41 mg/dL — ABNORMAL HIGH (ref 0.61–1.24)
Creatinine, Ser: 2.22 mg/dL — ABNORMAL HIGH (ref 0.61–1.24)
GFR calc Af Amer: 35 mL/min — ABNORMAL LOW (ref >60)
GFR calc non Af Amer: 27 mL/min — ABNORMAL LOW (ref >60)
GFR, EST AFRICAN AMERICAN: 32 mL/min — AB (ref >60)
GFR, EST NON AFRICAN AMERICAN: 30 mL/min — AB (ref >60)
Glucose, Bld: 127 mg/dL — ABNORMAL HIGH (ref 70–99)
Glucose, Bld: 192 mg/dL — ABNORMAL HIGH (ref 70–99)
POTASSIUM: 4.7 mmol/L (ref 3.5–5.1)
Phosphorus: 3.8 mg/dL (ref 2.5–4.6)
Phosphorus: 4.4 mg/dL (ref 2.5–4.6)
Potassium: 3.8 mmol/L (ref 3.5–5.1)
SODIUM: 137 mmol/L (ref 135–145)
SODIUM: 135 mmol/L (ref 135–145)

## 2015-01-11 LAB — GLUCOSE, CAPILLARY
GLUCOSE-CAPILLARY: 116 mg/dL — AB (ref 70–99)
GLUCOSE-CAPILLARY: 134 mg/dL — AB (ref 70–99)
GLUCOSE-CAPILLARY: 211 mg/dL — AB (ref 70–99)
Glucose-Capillary: 141 mg/dL — ABNORMAL HIGH (ref 70–99)
Glucose-Capillary: 152 mg/dL — ABNORMAL HIGH (ref 70–99)
Glucose-Capillary: 155 mg/dL — ABNORMAL HIGH (ref 70–99)
Glucose-Capillary: 192 mg/dL — ABNORMAL HIGH (ref 70–99)

## 2015-01-11 LAB — HEPARIN LEVEL (UNFRACTIONATED): Heparin Unfractionated: 0.45 [IU]/mL (ref 0.30–0.70)

## 2015-01-11 LAB — MAGNESIUM: Magnesium: 2.8 mg/dL — ABNORMAL HIGH (ref 1.7–2.4)

## 2015-01-11 LAB — AFP TUMOR MARKER: AFP-Tumor Marker: 2.7 ng/mL (ref 0.0–8.3)

## 2015-01-11 MED ORDER — METHYLPREDNISOLONE SODIUM SUCC 40 MG IJ SOLR
40.0000 mg | Freq: Four times a day (QID) | INTRAMUSCULAR | Status: AC
  Administered 2015-01-11 – 2015-01-13 (×8): 40 mg via INTRAVENOUS
  Filled 2015-01-11 (×8): qty 1

## 2015-01-11 MED ORDER — VITAL HIGH PROTEIN PO LIQD
1000.0000 mL | ORAL | Status: DC
  Administered 2015-01-11: 1000 mL
  Filled 2015-01-11 (×4): qty 1000

## 2015-01-11 NOTE — Progress Notes (Signed)
  [facility] Progress Note   Subjective:  UOP down, pressors stopped this am. CXR show progressive decrease in edema, resolved as off yesterday  Filed Vitals:   01/11/15 0600 01/11/15 0700 01/11/15 0723 01/11/15 0757  BP: 102/66 104/57  121/69  Pulse: 62 57    Temp:   97.7 F (36.5 C)   TempSrc:   Oral   Resp: 13 13  19   Height:      Weight:      SpO2: 100% 100%     Exam: On vent, eyes open No jvd Chest clear ant and lat RRR no rub or gallop Abd very obese, ND NT +BS x 4 GU foley in place Ext mild-moderate diffuse edema of LE's and UE's Neuro is responsive  Renal US 11-13 cm kidneys , no hydro UA 5.0, 1.020, prot 30, rbc 21-50/hpf, wbc 0-2/hpf UNa 30, UCreat 230 CXR 5/4 mild vasc congestion, L basilar process no chg  Assessment: 1. AKI - ATN due to shock, nonoliguric 2. Shock / fevers - all cx's negative off pressors s/p 2wks of abx 3. S/P R rotator cuff repair 4/22 4. Morbid obesity 5. VDRF 6. Bilat LE DVT / suspected PE - on IV hep  Plan - off pressors now, plan is to d/c CRRT today when filter clots and follow next  24-48 hrs, see how he does on his own    Vinson Moselleob Christy Ehrsam MD  pager [phone]    cell [phone]  01/11/2015, 9:43 AM     Recent Labs Lab 01/10/15 0527 01/10/15 1545 01/11/15 0330  NA 141  141 139 137  K 4.0  4.0 4.5 3.8  CL 101  102 102 99*  CO2 25  26 26 25   GLUCOSE 152*  154* 177* 127*  BUN 162*  165* 125* 75*  CREATININE 4.61*  4.71* 3.89* 2.41*  CALCIUM 8.3*  8.3* 8.3* 8.1*  PHOS 6.0* 6.7* 3.8    Recent Labs Lab 01/10/15 0527 01/10/15 1545 01/11/15 0330  ALBUMIN 2.4* 2.5* 2.6*    Recent Labs Lab 01/08/15 0350 01/09/15 0515 01/10/15 0527  WBC 15.6* 14.3* 12.3*  HGB 13.4 12.6* 12.3*  HCT 43.4 40.7 38.5*  MCV 102.6* 100.5* 97.7  PLT 120* 142* 196   . alteplase  2 mg Intracatheter Once  . antiseptic oral rinse  7 mL Mouth Rinse QID  . aspirin  325 mg Oral Daily  . chlorhexidine  15 mL Mouth Rinse  BID  . famotidine  20 mg Per Tube Daily  . feeding supplement (PRO-STAT SUGAR FREE 64)  60 mL Oral QID  . feeding supplement (VITAL HIGH PROTEIN)  1,000 mL Per Tube Q24H  . folic acid  1 mg Per Tube Daily  . insulin aspart  0-15 Units Subcutaneous 6 times per day  . insulin glargine  10 Units Subcutaneous BID  . sodium chloride  10-40 mL Intracatheter Q12H   . sodium chloride 10 mL/hr at 01/09/15 1900  . fentaNYL infusion INTRAVENOUS 100 mcg/hr (01/10/15 2305)  . heparin 1,600 Units/hr (01/10/15 1907)  . phenylephrine (NEO-SYNEPHRINE) Adult infusion 50 mcg/min (01/11/15 0500)  . dialysis replacement fluid (prismasate) 400 mL/hr at 01/11/15 0816  . dialysis replacement fluid (prismasate) 200 mL/hr at 01/09/15 2000  . dialysate (PRISMASATE) 2,000 mL/hr at 01/11/15 0918  . propofol (DIPRIVAN) infusion 20 mcg/kg/min (01/11/15 0645)   acetaminophen (TYLENOL) oral liquid 160 mg/5 mL, fentaNYL, heparin, hydrALAZINE, ipratropium-albuterol, lip balm, midazolam, [DISCONTINUED] ondansetron **OR** ondansetron (ZOFRAN) IV, sodium chloride

## 2015-01-11 NOTE — Progress Notes (Signed)
ANTICOAGULATION CONSULT NOTE - Follow Up  Pharmacy Consult for Heparin Indication: DVT  No Known Allergies  Patient Measurements: Height: 5\' 11"  (180.3 cm) Weight: (!) 348 lb (157.852 kg) IBW/kg (Calculated) : 75.3 Heparin Dosing Weight: 102.9 kg  Vital Signs: Temp: 97.7 F (36.5 C) (05/06 0723) Temp Source: Oral (05/06 0723) BP: 104/57 mmHg (05/06 0700) Pulse Rate: 57 (05/06 0700)  Labs:  Recent Labs  01/09/15 0515 01/09/15 1510  01/09/15 2328 01/10/15 0527 01/10/15 1545 01/11/15 0330  HGB 12.6*  --   --   --  12.3*  --   --   HCT 40.7  --   --   --  38.5*  --   --   PLT 142*  --   --   --  196  --   --   APTT  --  25  --   --   --   --   --   LABPROT  --  14.0  --   --   --   --   --   INR  --  1.07  --   --   --   --   --   HEPARINUNFRC  --   --   --  0.47 0.41  --  0.45  CREATININE 5.51*  --   < >  --  4.61*  4.71* 3.89* 2.41*  < > = values in this interval not displayed.  Estimated Creatinine Clearance: 49.3 mL/min (by C-G formula based on Cr of 2.41).   Assessment: 63 YOM on IV heparin for bilateral DVTs. Heparin level is therapeutic on 1600 units/hr. No CBC this a.m. No bleeding noted.    Goal of Therapy:  Heparin level 0.3-0.7 units/ml Monitor platelets by anticoagulation protocol: Yes  Plan:  Continue heparin at 1600 units/hr.  Daily heparin level and CBC.  Monitor for signs and symptoms of bleeding.  Christoper Fabianaron Tresten Pantoja, PharmD, BCPS Clinical pharmacist, pager [phone] 01/11/2015,7:31 AM

## 2015-01-11 NOTE — Progress Notes (Signed)
PULMONARY / CRITICAL CARE MEDICINE   Name: [name] MRN: [id number] DOB: Aug 19, 1951    ADMISSION DATE:  12/28/2014 CONSULTATION DATE:  12/28/14   REFERRING MD :  Imogene BurnJenkins/TRH  PT PROFILE:  [age] morbidly obese male admitted 4/22 by Ortho for elective R rotator cuff tear. Intubated early 4/23 AM for post operative hypercarbic/hypoxic resp failure.  STUDIES:  4/26 ECHO >> nml LV, EF 60-65%, Grade 1 DD, trivial pericardial effusion without evidence of tamponade 4/27 us testes with masses 5/02 renal US >> no hydronephrosis, Possible nonobstructing stone lower pole left kidney 5/04 doppler legs >> b/l DVT  MAJOR EVENTS: 4/22  elective R shoulder surgery 4/23  reintubated for hypercarbic/hypoxic resp failure 4/23  US scrotum: Two solid extratesticular right scrotal masses. Primary differential diagnosis for extratesticular masses include primary or metastatic neoplasms including adenomatoid tumor, fibroma, leiomyoma, mesothelioma, or sarcoma. Urology consultation is recommended  5/04 [facility] 5/06 Off pressors  SUBJECTIVE:   Tolerating PS 10/5 >> not much cuff leak.  VITAL SIGNS: Temp:  [97.2 F (36.2 C)-97.7 F (36.5 C)] 97.7 F (36.5 C) (05/06 0723) Pulse Rate:  [51-63] 57 (05/06 0700) Resp:  [12-20] 19 (05/06 0757) BP: (89-121)/(46-94) 121/69 mmHg (05/06 0757) SpO2:  [95 %-100 %] 100 % (05/06 0700) FiO2 (%):  [40 %] 40 % (05/06 0757) Weight:  [348 lb (157.852 kg)] 348 lb (157.852 kg) (05/06 0500)   VENTILATOR SETTINGS: Vent Mode:  [-] PRVC FiO2 (%):  [40 %] 40 % Set Rate:  [14 bmp] 14 bmp Vt Set:  [600 mL] 600 mL PEEP:  [5 cmH20] 5 cmH20 Plateau Pressure:  [19 cmH20-25 cmH20] 24 cmH20   INTAKE / OUTPUT:  Intake/Output Summary (Last 24 hours) at 01/11/15 1034 Last data filed at 01/11/15 1000  Gross per 24 hour  Intake 2778.6 ml  Output   2168 ml  Net  610.6 ml    PHYSICAL EXAMINATION: General: no distress Neuro: RASS -1, follows simple  commands HEENT: ETT in place Cardiovascular: regular Lungs: no wheeze Abdomen: soft, non tender Ext: R shoulder surgical dressing, no edema, sling in place  LABS:  CBC  Recent Labs Lab 01/08/15 0350 01/09/15 0515 01/10/15 0527  WBC 15.6* 14.3* 12.3*  HGB 13.4 12.6* 12.3*  HCT 43.4 40.7 38.5*  PLT 120* 142* 196   Coag's  Recent Labs Lab 01/09/15 1510  APTT 25  INR 1.07     BMET  Recent Labs Lab 01/10/15 0527 01/10/15 1545 01/11/15 0330  NA 141  141 139 137  K 4.0  4.0 4.5 3.8  CL 101  102 102 99*  CO2 25  26 26 25   BUN 162*  165* 125* 75*  CREATININE 4.61*  4.71* 3.89* 2.41*  GLUCOSE 152*  154* 177* 127*   Electrolytes  Recent Labs Lab 01/06/15 1504  01/10/15 0527 01/10/15 1545 01/11/15 0330  CALCIUM 9.1  < > 8.3*  8.3* 8.3* 8.1*  MG 2.8*  --  3.5*  --  2.8*  PHOS  --   < > 6.0* 6.7* 3.8  < > = values in this interval not displayed.   Sepsis Markers  Recent Labs Lab 01/06/15 0430 01/06/15 0738  LATICACIDVEN 1.8 1.8   ABG  Recent Labs Lab 01/05/15 0500 01/06/15 0440  PHART 7.386 7.434  PCO2ART 64.7* 49.3*  PO2ART 81.4 95.8   Liver Enzymes  Recent Labs Lab 01/10/15 0527 01/10/15 1545 01/11/15 0330  ALBUMIN 2.4* 2.5* 2.6*   Cardiac Enzymes  Recent  Labs Lab 01/06/15 0738 01/06/15 1346 01/06/15 2010  TROPONINI 0.48* 0.39* 0.36*   Glucose  Recent Labs Lab 01/10/15 0725 01/10/15 1209 01/10/15 1536 01/10/15 2359 01/11/15 0300 01/11/15 0721  GLUCAP 133* 140* 178* 116* 134* 152*    CXR:  Dg Abd 1 View  01/09/2015   CLINICAL DATA:  64 year old male enteric tube placement. Initial encounter.  EXAM: ABDOMEN - 1 VIEW  COMPARISON:  Portable chest radiograph 0756 hours today.  FINDINGS: Portable AP supine view at 1135 hours. Enteric tube courses to the stomach. Side hole projects at the mid gastric body level. Negative visible bowel gas pattern.  IMPRESSION: Enteric tube within the stomach, side hole at the gastric  body level.   Electronically Signed   By: Odessa FlemingH  Hall M.D.   On: 01/09/2015 11:53   Dg Chest Port 1 View  01/10/2015   CLINICAL DATA:  Acute respiratory failure  EXAM: PORTABLE CHEST - 1 VIEW  COMPARISON:  01/09/2015  FINDINGS: Endotracheal tube is 4.3 cm above the carina. The nasogastric tube extends below the diaphragm and off the inferior edge of the image. There is a left upper extremity PICC line extending into the SVC. There is unchanged cardiomegaly. There has been slow improvement over the past several examinations, now with no confluent airspace opacity and no significant effusion. Pulmonary vasculature is normal.  IMPRESSION: Support equipment appears satisfactorily positioned.  Improved.   Electronically Signed   By: Ellery Plunkaniel R Mitchell M.D.   On: 01/10/2015 05:26      ASSESSMENT / PLAN:  PULMONARY A:  Acute hypoxic/hypercapnic respiratory. Possible OSA/OHS. Tobacco abuse. P:   Pressure support wean as tolerated  Scheduled for Trach with ENT >> ?if he should have extubation trial first F/u CXR BD's Add solumedrol 5/06 to improve upper airway edema   CARDIOVASCULAR A:  Hx of HTN and chronic diastolic heart failure. Elevated troponin >> demand ischemia. Hx of HLD. VT episode 4/26. B/l lower extremity DVT and probable PE. P:  Continue ASA Monitor hemodynamics Heparin gtt for now  RENAL A:   AKI 2nd to ATN. Scrotal mass >> LDH elevated, AFP negative. P:   Transitioning off CRRT 5/06 Will need CT chest/abd/pelvis and urology evaluation when more stable Check HCG  GASTROINTESTINAL A:  Nutrition. P:   Tube feeds Pepcid for SUP  HEMATOLOGIC A:   Polycythemia - likely chronic hypoxemia >> improved. P:  F/u CBC  INFECTIOUS A:   HCAP >> completed abx 5/05. P:  Monitor off Abx  ENDOCRINE A:   Hyperglycemia. P:   SSI  NEUROLOGIC A:  Acute metabolic encephalopathy. P:   RASS goal 0 PRN fentanyl for pain control  ORTHO A: Rt shoulder surgery  4/22. P: Per ortho  Summary: Oxygenation, respiratory mechanics, hemodynamics, and CXR are all in favor of extubation trial.  Concern is for upper airway edema (not much cuff leak), and body habitus >> likely difficult airway.  Will give course of solumedrol, and re-assess if he is candidate for extubation trial.  CC time 35 minutes.  Coralyn HellingVineet Kadian Barcellos, MD [facility] Pulmonary/Critical Care 01/11/2015, 10:34 AM Pager:  [phone] After 3pm call: [phone]

## 2015-01-11 NOTE — Progress Notes (Signed)
Occupational Therapy Treatment Patient Details Name: [name] MRN: [id number] DOB: 07-06-1951 Today's Date: 01/11/2015    History of present illness [facility] or elective Rt RCR.  He was intubated 4/23 am for post op hypercarbic resp failure.  US testes show masses - work up to be one by urology; 5/4 dopplers showed bil. LE DVTs; 5/4 [facility].  PMH includes morbid obesity, arthritis    OT comments  PROM performed elbow, wrist, and hand   Follow Up Recommendations  Other (comment) (TBD - meical status liiting ability to deterine )    Equipment Recommendations  Other (comment) (TBD)    Recommendations for Other Services      Precautions / Restrictions Precautions Precautions: Fall;Shoulder;Other (comment) (ETT, CRRT, mulitple lines and tubes ) Type of Shoulder Precautions: Abduction sling in place at all ties.  PROM Rt elbow, wrist, and hand daily  Shoulder Interventions: Shoulder abduction pillow Precaution Comments: shoulder, abductor sling on RUE, except for PROM to elbow and wrist; then removed for this per MD       Mobility Bed Mobility                  Transfers                      Balance                                   ADL                                         General ADL Comments: skin without compromise       Vision                     Perception     Praxis      Cognition   Behavior During Therapy: Flat affect Overall Cognitive Status: Difficult to assess                       Extremity/Trunk Assessment               Exercises General Exercises - Upper Extremity Elbow Flexion: PROM;Right;Supine;15 reps Elbow Extension: PROM;Right;Supine;15 reps Wrist Flexion: PROM;Right;Supine;15 reps Wrist Extension: PROM;Right;Supine;15 reps Digit Composite Flexion: PROM;Right;Supine;15 reps Composite Extension: PROM;Right;Supine;15 reps    Shoulder Instructions       General Comments      Pertinent Vitals/ Pain       Pain Assessment: Faces Faces Pain Scale: No hurt  Home Living                                          Prior Functioning/Environment              Frequency Min 6X/week     Progress Toward Goals  OT Goals([place] goals can now be found in the care plan section)  Progress towards OT goals: Progressing toward goals     Plan Other (comment) (TBD)    Co-evaluation                 End of Session Equipment Utilized During Treatment: Other (comment) (abduction sling  Lt UE )   Activity Tolerance Patient tolerated treatment well   Patient Left in bed   Nurse Communication          Time: [id number] OT Time Calculation (min): 11 min  Charges: OT General Charges $OT Visit: 1 Procedure OT Treatments $Therapeutic Exercise: 8-22 mins  [name] 01/11/2015, 4:36 PM

## 2015-01-11 NOTE — Progress Notes (Signed)
NUTRITION FOLLOW-UP  INTERVENTION:  Continue TF via OGT with Vital High Protein, decrease goal rate to 35 ml/h (840 ml per day) with Prostat 60 ml QID to provide 1640 kcals, 194 gm protein (2.5 gm/kg ideal weight), 702 ml free water daily  Above TF regimen plus Hammad kcals from Propofol will provide a total of 2263 kcals (14.9 kcals/kg actual weight)  Recommend bowel regimen (no BM recorded since 4/27)  NUTRITION DIAGNOSIS: Inadequate oral intake related to inability to eat as evidenced by NPO status, ongoing.  Goal: Enteral nutrition to provide nutrition needs based on ASPEN guidelines for hypocaloric, high protein feeding in critically ill obese individuals, met.  Monitor:  TF regimen & tolerance, respiratory status, weight, labs  ASSESSMENT: 64 y/o with h/o HTN who was admitted to step down after difficulty extubating after rotator cuff repair.   S/p 4/22 Procedure(s) (LRB): RIGHT SHOULDER ARTHROSCOPY WITH SUBACROMIAL DECOMPRESSION, DISTAL CLAVICLE RESECTION, ROTATOR CUFF REPAIR (Right)  Patient was transferred to [facility] ICU on 5/4 for CRRT. CRRT ongoing. Patient is currently receiving Vital High Protein via OGT at 40 ml/h (960 ml/day) with Prostat 60 ml QID to provide 1760 kcals, 204 gm protein, 803 ml free water daily. TF plus [place] kcals from Propofol is providing a total of 2383 kcals.  Patient is currently intubated on ventilator support MV: 12.3 L/min Temp (24hrs), Avg:97.4 F (36.3 C), Min:97.2 F (36.2 C), Max:97.7 F (36.5 C)  Propofol: 23.6 ml/h providing 623 kcals per day.  Labs reviewed:   Height: Ht Readings from Last 1 Encounters:  01/07/15 $RemoveB'5\' 11"'OFfNVBDn$  (1.803 m)    Weight: Wt Readings from Last 1 Encounters:  01/11/15 348 lb (157.852 kg)  01/07/15 151.5 kg 01/02/15 154 kg 12/28/14 173 kg  Ideal Body Weight: 78.2 kg  BMI:  Body mass index is 48.56 kg/(m^2).  Re-estimated Nutritional Needs: Kcal: 9528-4132 Protein: >195 g Fluid: 2 L  Skin:  shoulder incision, scrotum wound  Diet Order: NPO  EDUCATION NEEDS: -No education needs identified at this time   Intake/Output Summary (Last 24 hours) at 01/11/15 1203 Last data filed at 01/11/15 1100  Gross per 24 hour  Intake 2728.5 ml  Output   2144 ml  Net  584.5 ml    Last BM: 4/27  Labs:   Recent Labs Lab 01/06/15 1504  01/10/15 0527 01/10/15 1545 01/11/15 0330  NA 152*  < > 141  141 139 137  K 3.7  < > 4.0  4.0 4.5 3.8  CL 111  < > 101  102 102 99*  CO2 30  < > $R'25  26 26 25  'rU$ BUN 96*  < > 162*  165* 125* 75*  CREATININE 3.28*  < > 4.61*  4.71* 3.89* 2.41*  CALCIUM 9.1  < > 8.3*  8.3* 8.3* 8.1*  MG 2.8*  --  3.5*  --  2.8*  PHOS  --   < > 6.0* 6.7* 3.8  GLUCOSE 223*  < > 152*  154* 177* 127*  < > = values in this interval not displayed.  CBG (last 3)   Recent Labs  01/10/15 2359 01/11/15 0300 01/11/15 0721  GLUCAP 116* 134* 152*    Scheduled Meds: . alteplase  2 mg Intracatheter Once  . antiseptic oral rinse  7 mL Mouth Rinse QID  . aspirin  325 mg Oral Daily  . chlorhexidine  15 mL Mouth Rinse BID  . famotidine  20 mg Per Tube Daily  . feeding supplement (PRO-STAT SUGAR FREE  64)  60 mL Oral QID  . feeding supplement (VITAL HIGH PROTEIN)  1,000 mL Per Tube Q24H  . folic acid  1 mg Per Tube Daily  . insulin aspart  0-15 Units Subcutaneous 6 times per day  . insulin glargine  10 Units Subcutaneous BID  . methylPREDNISolone (SOLU-MEDROL) injection  40 mg Intravenous Q6H  . sodium chloride  10-40 mL Intracatheter Q12H    Continuous Infusions: . sodium chloride 10 mL/hr at 01/11/15 1110  . fentaNYL infusion INTRAVENOUS 150 mcg/hr (01/11/15 1100)  . heparin 1,600 Units/hr (01/11/15 1040)  . phenylephrine (NEO-SYNEPHRINE) Adult infusion Stopped (01/11/15 0900)  . dialysis replacement fluid (prismasate) 400 mL/hr at 01/11/15 0816  . dialysis replacement fluid (prismasate) 200 mL/hr at 01/09/15 2000  . dialysate (PRISMASATE) 2,000 mL/hr at  01/11/15 1149  . propofol (DIPRIVAN) infusion 25 mcg/kg/min (01/11/15 1150)     Molli Barrows, RD, LDN, [place] Pager [phone] After Hours Pager [phone]

## 2015-01-12 ENCOUNTER — Inpatient Hospital Stay (HOSPITAL_COMMUNITY): Payer: Worker's Compensation

## 2015-01-12 LAB — CBC
HEMATOCRIT: 39.8 % (ref 39.0–52.0)
HEMOGLOBIN: 12.9 g/dL — AB (ref 13.0–17.0)
MCH: 31.3 pg (ref 26.0–34.0)
MCHC: 32.4 g/dL (ref 30.0–36.0)
MCV: 96.6 fL (ref 78.0–100.0)
PLATELETS: 233 10*3/uL (ref 150–400)
RBC: 4.12 MIL/uL — AB (ref 4.22–5.81)
RDW: 16.2 % — ABNORMAL HIGH (ref 11.5–15.5)
WBC: 11.9 10*3/uL — AB (ref 4.0–10.5)

## 2015-01-12 LAB — RENAL FUNCTION PANEL
ALBUMIN: 2.4 g/dL — AB (ref 3.5–5.0)
ANION GAP: 10 (ref 5–15)
BUN: 83 mg/dL — AB (ref 6–20)
CO2: 24 mmol/L (ref 22–32)
CREATININE: 3 mg/dL — AB (ref 0.61–1.24)
Calcium: 8.5 mg/dL — ABNORMAL LOW (ref 8.9–10.3)
Chloride: 99 mmol/L — ABNORMAL LOW (ref 101–111)
GFR calc Af Amer: 24 mL/min — ABNORMAL LOW (ref >60)
GFR, EST NON AFRICAN AMERICAN: 21 mL/min — AB (ref >60)
Glucose, Bld: 212 mg/dL — ABNORMAL HIGH (ref 70–99)
Phosphorus: 5.9 mg/dL — ABNORMAL HIGH (ref 2.5–4.6)
Potassium: 4.7 mmol/L (ref 3.5–5.1)
Sodium: 133 mmol/L — ABNORMAL LOW (ref 135–145)

## 2015-01-12 LAB — GLUCOSE, CAPILLARY
GLUCOSE-CAPILLARY: 194 mg/dL — AB (ref 70–99)
GLUCOSE-CAPILLARY: 240 mg/dL — AB (ref 70–99)
GLUCOSE-CAPILLARY: 240 mg/dL — AB (ref 70–99)
Glucose-Capillary: 217 mg/dL — ABNORMAL HIGH (ref 70–99)
Glucose-Capillary: 170 mg/dL — ABNORMAL HIGH (ref 70–99)
Glucose-Capillary: 238 mg/dL — ABNORMAL HIGH (ref 70–99)

## 2015-01-12 LAB — HEPARIN LEVEL (UNFRACTIONATED): Heparin Unfractionated: 0.48 IU/mL (ref 0.30–0.70)

## 2015-01-12 LAB — MAGNESIUM: MAGNESIUM: 2.9 mg/dL — AB (ref 1.7–2.4)

## 2015-01-12 NOTE — Progress Notes (Signed)
Subjective: 15 Days Post-Op Procedure(s) (LRB): RIGHT SHOULDER ARTHROSCOPY WITH SUBACROMIAL DECOMPRESSION, DISTAL CLAVICLE RESECTION, ROTATOR CUFF REPAIR  (Right) Patient on vent lying in bed. Sling in place. In stable condition.   Objective: Vital signs in last 24 hours: Temp:  [97.4 F (36.3 C)-97.8 F (36.6 C)] 97.4 F (36.3 C) (05/07 0741) Pulse Rate:  [54-72] 72 (05/07 1120) Resp:  [12-23] 12 (05/07 1120) BP: (89-110)/(52-74) 91/55 mmHg (05/07 1120) SpO2:  [93 %-100 %] 97 % (05/07 1120) FiO2 (%):  [40 %] 40 % (05/07 1120) Weight:  [160.12 kg (353 lb)] 160.12 kg (353 lb) (05/07 0442)  Intake/Output from previous day: 05/06 0701 - 05/07 0700 In: 2776.4 [I.V.:1501.4; NG/GT:1275] Out: 2621 [Urine:125] Intake/Output this shift:     Recent Labs  01/10/15 0527 01/12/15 0440  HGB 12.3* 12.9*    Recent Labs  01/10/15 0527 01/12/15 0440  WBC 12.3* 11.9*  RBC 3.94* 4.12*  HCT 38.5* 39.8  PLT 196 233    Recent Labs  01/11/15 1600 01/12/15 1030  NA 135 133*  K 4.7 4.7  CL 100* 99*  CO2 24 24  BUN 66* 83*  CREATININE 2.22* 3.00*  GLUCOSE 192* 212*  CALCIUM 8.2* 8.5*    Recent Labs  01/09/15 1510  INR 1.07    ON vent. Sling in place. 2+ radial pulse. Capillary refill <3sec.   Sutures removed intactly from right shoulder.  Assessment/Plan: 15 Days Post-Op Procedure(s) (LRB): RIGHT SHOULDER ARTHROSCOPY WITH SUBACROMIAL DECOMPRESSION, DISTAL CLAVICLE RESECTION, ROTATOR CUFF REPAIR  (Right) Sutures removed. Dressing applied. Keep clean and dry. Continue OT Continue skin hygiene.  remain in sling other then ROM and bathing Continue all other [place] care  [name] 01/12/2015, 11:43 AM

## 2015-01-12 NOTE — Progress Notes (Signed)
  [facility] Progress Note   Subjective:  2.7L in and 2.6L out  Filed Vitals:   01/12/15 0600 01/12/15 0700 01/12/15 0741 01/12/15 0808  BP: 104/64 105/61  110/74  Pulse: 56 58    Temp:   97.4 F (36.3 C)   TempSrc:   Oral   Resp: 13 12    Height:      Weight:      SpO2: 99% 98%     Exam: On vent, eyes open No jvd Chest clear ant and lat RRR no rub or gallop Abd very obese, ND NT +BS x 4 GU foley in place Ext mild-moderate diffuse edema of LE's and UE's Neuro is sedated on the vent  Renal US 11-13 cm kidneys , no hydro UA 5.0, 1.020, prot 30, rbc 21-50/hpf, wbc 0-2/hpf UNa 30, UCreat 230 CXR 5/4 mild vasc congestion, L basilar process no chg  Assessment: 1. AKI - ATN due to shock, nonoliguric. Stopped CRRT, watch renal function, limit volume intake as much as possible 2. Vol excess moderate diffuse edema 3. Shock / fevers - all cx's negative off pressors s/p 2wks of abx 4. S/P R rotator cuff repair 4/22 5. Morbid obesity 6. VDRF 7. Bilat LE DVT / suspected PE - on IV hep  Plan - as above    Vinson Moselleob Eliyohu Class MD  pager [phone]    cell [phone]  01/12/2015, 10:32 AM     Recent Labs Lab 01/10/15 1545 01/11/15 0330 01/11/15 1600  NA 139 137 135  K 4.5 3.8 4.7  CL 102 99* 100*  CO2 26 25 24   GLUCOSE 177* 127* 192*  BUN 125* 75* 66*  CREATININE 3.89* 2.41* 2.22*  CALCIUM 8.3* 8.1* 8.2*  PHOS 6.7* 3.8 4.4    Recent Labs Lab 01/10/15 1545 01/11/15 0330 01/11/15 1600  ALBUMIN 2.5* 2.6* 2.6*    Recent Labs Lab 01/09/15 0515 01/10/15 0527 01/12/15 0440  WBC 14.3* 12.3* 11.9*  HGB 12.6* 12.3* 12.9*  HCT 40.7 38.5* 39.8  MCV 100.5* 97.7 96.6  PLT 142* 196 233   . alteplase  2 mg Intracatheter Once  . antiseptic oral rinse  7 mL Mouth Rinse QID  . aspirin  325 mg Oral Daily  . chlorhexidine  15 mL Mouth Rinse BID  . famotidine  20 mg Per Tube Daily  . feeding supplement (PRO-STAT SUGAR FREE 64)  60 mL Oral QID  . feeding  supplement (VITAL HIGH PROTEIN)  1,000 mL Per Tube Q24H  . folic acid  1 mg Per Tube Daily  . insulin aspart  0-15 Units Subcutaneous 6 times per day  . insulin glargine  10 Units Subcutaneous BID  . methylPREDNISolone (SOLU-MEDROL) injection  40 mg Intravenous Q6H  . sodium chloride  10-40 mL Intracatheter Q12H   . sodium chloride 10 mL/hr at 01/11/15 1110  . fentaNYL infusion INTRAVENOUS 150 mcg/hr (01/12/15 [id number])  . heparin 1,600 Units/hr (01/11/15 2344)  . phenylephrine (NEO-SYNEPHRINE) Adult infusion Stopped (01/11/15 0900)  . propofol (DIPRIVAN) infusion 25 mcg/kg/min (01/12/15 [id number])   acetaminophen (TYLENOL) oral liquid 160 mg/5 mL, fentaNYL, heparin, hydrALAZINE, ipratropium-albuterol, lip balm, midazolam, [DISCONTINUED] ondansetron **OR** ondansetron (ZOFRAN) IV, sodium chloride

## 2015-01-12 NOTE — Progress Notes (Signed)
1900 Residual removed from patient. Tube feeds turned off. Residual not returned to patient, Elink called. Spoke with AvnetPaul RN. I was confirmed that message would be reported to Dr. Darrick Pennaeterding.  Juanda BondJames Yotam Rhine Critical Care Team  Registered Nurse

## 2015-01-12 NOTE — Progress Notes (Signed)
PULMONARY / CRITICAL CARE MEDICINE   Name: [name] MRN: [id number] DOB: 1950-09-25    ADMISSION DATE:  12/28/2014 CONSULTATION DATE:  12/28/14   REFERRING MD :  Imogene BurnJenkins/TRH  PT PROFILE:  [age] morbidly obese male admitted 4/22 by Ortho for elective R rotator cuff tear. Intubated early 4/23 AM for post operative hypercarbic/hypoxic resp failure.  STUDIES:  4/26 ECHO >> nml LV, EF 60-65%, Grade 1 DD, trivial pericardial effusion without evidence of tamponade 4/27 us testes with masses 5/02 renal US >> no hydronephrosis, Possible nonobstructing stone lower pole left kidney 5/04 doppler legs >> b/l DVT  MAJOR EVENTS: 4/22  elective R shoulder surgery 4/23  reintubated for hypercarbic/hypoxic resp failure 4/23  US scrotum: Two solid extratesticular right scrotal masses. Primary differential diagnosis for extratesticular masses include primary or metastatic neoplasms including adenomatoid tumor, fibroma, leiomyoma, mesothelioma, or sarcoma. Urology consultation is recommended  5/04 [facility] 5/06 Off pressors  SUBJECTIVE:   Remains on sedation.  VITAL SIGNS: Temp:  [97.4 F (36.3 C)-97.8 F (36.6 C)] 97.4 F (36.3 C) (05/07 0741) Pulse Rate:  [54-71] 58 (05/07 0700) Resp:  [12-25] 12 (05/07 0700) BP: (88-112)/(49-74) 110/74 mmHg (05/07 0808) SpO2:  [93 %-100 %] 98 % (05/07 0700) FiO2 (%):  [40 %] 40 % (05/07 0808) Weight:  [353 lb (160.12 kg)] 353 lb (160.12 kg) (05/07 0442)   VENTILATOR SETTINGS: Vent Mode:  [-] PRVC FiO2 (%):  [40 %] 40 % Set Rate:  [14 bmp] 14 bmp Vt Set:  [600 mL] 600 mL PEEP:  [5 cmH20] 5 cmH20 Plateau Pressure:  [12 cmH20-22 cmH20] 22 cmH20   INTAKE / OUTPUT:  Intake/Output Summary (Last 24 hours) at 01/12/15 0904 Last data filed at 01/12/15 0700  Gross per 24 hour  Intake 2576.81 ml  Output   2417 ml  Net 159.81 ml    PHYSICAL EXAMINATION: General: no distress Neuro: RASS -2 HEENT: ETT in place Cardiovascular:  regular Lungs: no wheeze Abdomen: soft, non tender Ext: R shoulder surgical dressing, no edema, sling in place  LABS:  CBC  Recent Labs Lab 01/09/15 0515 01/10/15 0527 01/12/15 0440  WBC 14.3* 12.3* 11.9*  HGB 12.6* 12.3* 12.9*  HCT 40.7 38.5* 39.8  PLT 142* 196 233   Coag's  Recent Labs Lab 01/09/15 1510  APTT 25  INR 1.07     BMET  Recent Labs Lab 01/10/15 1545 01/11/15 0330 01/11/15 1600  NA 139 137 135  K 4.5 3.8 4.7  CL 102 99* 100*  CO2 26 25 24   BUN 125* 75* 66*  CREATININE 3.89* 2.41* 2.22*  GLUCOSE 177* 127* 192*   Electrolytes  Recent Labs Lab 01/10/15 0527 01/10/15 1545 01/11/15 0330 01/11/15 1600 01/12/15 0440  CALCIUM 8.3*  8.3* 8.3* 8.1* 8.2*  --   MG 3.5*  --  2.8*  --  2.9*  PHOS 6.0* 6.7* 3.8 4.4  --      Sepsis Markers  Recent Labs Lab 01/06/15 0430 01/06/15 0738  LATICACIDVEN 1.8 1.8   ABG  Recent Labs Lab 01/06/15 0440  PHART 7.434  PCO2ART 49.3*  PO2ART 95.8   Liver Enzymes  Recent Labs Lab 01/10/15 1545 01/11/15 0330 01/11/15 1600  ALBUMIN 2.5* 2.6* 2.6*   Cardiac Enzymes  Recent Labs Lab 01/06/15 0738 01/06/15 1346 01/06/15 2010  TROPONINI 0.48* 0.39* 0.36*   Glucose  Recent Labs Lab 01/11/15 1207 01/11/15 1609 01/11/15 2057 01/12/15 0010 01/12/15 0424 01/12/15 0745  GLUCAP 155* 192* 211*  240* 217* 240*    CXR:  Dg Chest Port 1 View  01/12/2015   CLINICAL DATA:  Respiratory failure  EXAM: PORTABLE CHEST - 1 VIEW  COMPARISON:  01/10/2015  FINDINGS: The endotracheal tube is 5.9 cm above the carina. The left upper extremity PICC line extends into the SVC. The nasogastric tube extends below the diaphragm and off the inferior edge of the image. There is unchanged cardiomegaly. There is mild vascular prominence, unchanged.  IMPRESSION: Support equipment appears satisfactorily positioned.  Unchanged cardiomegaly and vascular prominence.   Electronically Signed   By: Ellery Plunkaniel R Mitchell M.D.   On:  01/12/2015 07:07      ASSESSMENT / PLAN:  PULMONARY A:  Acute hypoxic/hypercapnic respiratory. Possible OSA/OHS. Tobacco abuse. P:   Pressure support wean as tolerated  Scheduled for Trach with ENT >> ?if he should have extubation trial first F/u CXR BD's Added solumedrol 5/06 to improve upper airway edema   CARDIOVASCULAR A:  Hx of HTN and chronic diastolic heart failure. Elevated troponin >> demand ischemia. Hx of HLD. VT episode 4/26. B/l lower extremity DVT and probable PE. P:  Continue ASA Monitor hemodynamics Heparin gtt for now  RENAL A:   AKI 2nd to ATN. Scrotal mass >> LDH elevated, AFP negative. P:   Transitioning off CRRT 5/06 Will need CT chest/abd/pelvis and urology evaluation when more stable Check HCG from 5/05  GASTROINTESTINAL A:  Nutrition. P:   Tube feeds Pepcid for SUP  HEMATOLOGIC A:   Polycythemia - likely chronic hypoxemia >> improved. P:  F/u CBC  INFECTIOUS A:   HCAP >> completed abx 5/05. P:  Monitor off Abx  ENDOCRINE A:   Hyperglycemia. P:   SSI  NEUROLOGIC A:  Acute metabolic encephalopathy. P:   RASS goal 0 PRN fentanyl for pain control  ORTHO A: Rt shoulder surgery 4/22. P: Per ortho  Summary: Oxygenation, respiratory mechanics, hemodynamics, and CXR are all in favor of extubation trial.  Concern is for upper airway edema (not much cuff leak), and body habitus >> likely difficult airway.  Will give course of solumedrol, and re-assess if he is candidate for extubation trial on 5/08 or 5/09.  CC time 35 minutes.  Coralyn HellingVineet Samarie Pinder, MD [facility] Pulmonary/Critical [facility] 01/12/2015, 9:04 AM Pager:  [phone] After 3pm call: [phone]

## 2015-01-12 NOTE — Progress Notes (Signed)
ANTICOAGULATION CONSULT NOTE - Follow Up  Pharmacy Consult for Heparin Indication: DVT  No Known Allergies  Patient Measurements: Height: 5\' 11"  (180.3 cm) Weight: (!) 353 lb (160.12 kg) IBW/kg (Calculated) : 75.3 Heparin Dosing Weight: 102.9 kg  Vital Signs: Temp: 97.4 F (36.3 C) (05/07 0741) Temp Source: Oral (05/07 0741) BP: 110/74 mmHg (05/07 0808) Pulse Rate: 58 (05/07 0700)  Labs:  Recent Labs  01/09/15 1510  01/10/15 0527 01/10/15 1545 01/11/15 0330 01/11/15 1600 01/12/15 0440  HGB  --   --  12.3*  --   --   --  12.9*  HCT  --   --  38.5*  --   --   --  39.8  PLT  --   --  196  --   --   --  233  APTT 25  --   --   --   --   --   --   LABPROT 14.0  --   --   --   --   --   --   INR 1.07  --   --   --   --   --   --   HEPARINUNFRC  --   < > 0.41  --  0.45  --  0.48  CREATININE  --   < > 4.61*  4.71* 3.89* 2.41* 2.22*  --   < > = values in this interval not displayed.  Estimated Creatinine Clearance: 54 mL/min (by C-G formula based on Cr of 2.22).   Assessment: 63 YOM on IV heparin for bilateral DVTs. Heparin level remains therapeutic on 1600 units/hr. CBC stable. No bleeding noted.    Goal of Therapy:  Heparin level 0.3-0.7 units/ml Monitor platelets by anticoagulation protocol: Yes  Plan:  Continue heparin at 1600 units/hr.  Daily heparin level and CBC.  Monitor for signs and symptoms of bleeding. F/u plan for oral anticoagulation.  Christoper Fabianaron Jehieli Brassell, PharmD, BCPS Clinical pharmacist, pager [phone] 01/12/2015,10:31 AM

## 2015-01-13 ENCOUNTER — Inpatient Hospital Stay (HOSPITAL_COMMUNITY): Payer: Worker's Compensation

## 2015-01-13 LAB — RENAL FUNCTION PANEL
ALBUMIN: 2.4 g/dL — AB (ref 3.5–5.0)
ANION GAP: 15 (ref 5–15)
BUN: 105 mg/dL — AB (ref 6–20)
CO2: 21 mmol/L — ABNORMAL LOW (ref 22–32)
Calcium: 8.4 mg/dL — ABNORMAL LOW (ref 8.9–10.3)
Chloride: 95 mmol/L — ABNORMAL LOW (ref 101–111)
Creatinine, Ser: 3.67 mg/dL — ABNORMAL HIGH (ref 0.61–1.24)
GFR, EST AFRICAN AMERICAN: 19 mL/min — AB (ref >60)
GFR, EST NON AFRICAN AMERICAN: 16 mL/min — AB (ref >60)
Glucose, Bld: 182 mg/dL — ABNORMAL HIGH (ref 70–99)
POTASSIUM: 4.5 mmol/L (ref 3.5–5.1)
Phosphorus: 7.8 mg/dL — ABNORMAL HIGH (ref 2.5–4.6)
Sodium: 131 mmol/L — ABNORMAL LOW (ref 135–145)

## 2015-01-13 LAB — GLUCOSE, CAPILLARY
GLUCOSE-CAPILLARY: 246 mg/dL — AB (ref 70–99)
GLUCOSE-CAPILLARY: 279 mg/dL — AB (ref 70–99)
Glucose-Capillary: 159 mg/dL — ABNORMAL HIGH (ref 70–99)
Glucose-Capillary: 169 mg/dL — ABNORMAL HIGH (ref 70–99)
Glucose-Capillary: 186 mg/dL — ABNORMAL HIGH (ref 70–99)
Glucose-Capillary: 276 mg/dL — ABNORMAL HIGH (ref 70–99)

## 2015-01-13 LAB — POCT I-STAT 3, ART BLOOD GAS (G3+)
Acid-base deficit: 5 mmol/L — ABNORMAL HIGH (ref 0.0–2.0)
Acid-base deficit: 5 mmol/L — ABNORMAL HIGH (ref 0.0–2.0)
Bicarbonate: 23 mEq/L (ref 20.0–24.0)
Bicarbonate: 24.5 mEq/L — ABNORMAL HIGH (ref 20.0–24.0)
O2 Saturation: 91 %
O2 Saturation: 99 %
Patient temperature: 97.9
Patient temperature: 97.9
TCO2: 25 mmol/L (ref 0–100)
TCO2: 26 mmol/L (ref 0–100)
pCO2 arterial: 52.7 mmHg — ABNORMAL HIGH (ref 35.0–45.0)
pCO2 arterial: 63.2 mmHg (ref 35.0–45.0)
pH, Arterial: 7.245 — ABNORMAL LOW (ref 7.350–7.450)
pH, Arterial: 7.195 — CL (ref 7.350–7.450)
pO2, Arterial: 70 mmHg — ABNORMAL LOW (ref 80.0–100.0)
pO2, Arterial: 166 mmHg — ABNORMAL HIGH (ref 80.0–100.0)

## 2015-01-13 LAB — CBC
HEMATOCRIT: 35.8 % — AB (ref 39.0–52.0)
Hemoglobin: 11.9 g/dL — ABNORMAL LOW (ref 13.0–17.0)
MCH: 31.6 pg (ref 26.0–34.0)
MCHC: 33.2 g/dL (ref 30.0–36.0)
MCV: 95 fL (ref 78.0–100.0)
Platelets: 265 10*3/uL (ref 150–400)
RBC: 3.77 MIL/uL — ABNORMAL LOW (ref 4.22–5.81)
RDW: 15.7 % — ABNORMAL HIGH (ref 11.5–15.5)
WBC: 13.5 10*3/uL — AB (ref 4.0–10.5)

## 2015-01-13 LAB — BLOOD GAS, ARTERIAL
ACID-BASE DEFICIT: 4.2 mmol/L — AB (ref 0.0–2.0)
Acid-base deficit: 5.3 mmol/L — ABNORMAL HIGH (ref 0.0–2.0)
BICARBONATE: 20.3 meq/L (ref 20.0–24.0)
Bicarbonate: 20.8 mEq/L (ref 20.0–24.0)
DRAWN BY: 398661
Drawn by: 398661
FIO2: 0.6 %
FIO2: 0.6 %
MECHVT: 600 mL
O2 SAT: 95.5 %
O2 SAT: 97.2 %
PATIENT TEMPERATURE: 97.6
PEEP: 5 cmH2O
PEEP: 5 cmH2O
Patient temperature: 97.5
RATE: 26 resp/min
RATE: 30 resp/min
TCO2: 22.3 mmol/L (ref 0–100)
TCO2: 21.4 mmol/L (ref 0–100)
VT: 600 mL
pCO2 arterial: 47.9 mmHg — ABNORMAL HIGH (ref 35.0–45.0)
pCO2 arterial: 35.9 mmHg (ref 35.0–45.0)
pH, Arterial: 7.256 — ABNORMAL LOW (ref 7.350–7.450)
pH, Arterial: 7.367 (ref 7.350–7.450)
pO2, Arterial: 83.6 mmHg (ref 80.0–100.0)
pO2, Arterial: 94.9 mmHg (ref 80.0–100.0)

## 2015-01-13 LAB — BETA HCG QUANT (REF LAB): Beta hCG, Tumor Marker: 3.7 m[IU]/mL

## 2015-01-13 LAB — HEPARIN LEVEL (UNFRACTIONATED)
HEPARIN UNFRACTIONATED: 1.7 [IU]/mL — AB (ref 0.30–0.70)
Heparin Unfractionated: 0.5 IU/mL (ref 0.30–0.70)

## 2015-01-13 LAB — TRIGLYCERIDES: Triglycerides: 313 mg/dL — ABNORMAL HIGH (ref <150)

## 2015-01-13 MED ORDER — CHLORHEXIDINE GLUCONATE 0.12 % MT SOLN
15.0000 mL | Freq: Two times a day (BID) | OROMUCOSAL | Status: DC
  Administered 2015-01-13 – 2015-01-19 (×13): 15 mL via OROMUCOSAL
  Filled 2015-01-13 (×12): qty 15

## 2015-01-13 MED ORDER — SORBITOL 70 % SOLN
960.0000 mL | TOPICAL_OIL | Freq: Once | ORAL | Status: AC
  Administered 2015-01-13: 960 mL via RECTAL
  Filled 2015-01-13: qty 240

## 2015-01-13 MED ORDER — FOLIC ACID 5 MG/ML IJ SOLN
1.0000 mg | Freq: Every day | INTRAMUSCULAR | Status: DC
Start: 2015-01-13 — End: 2015-01-14
  Filled 2015-01-13 (×2): qty 0.2

## 2015-01-13 MED ORDER — FUROSEMIDE 10 MG/ML IJ SOLN
160.0000 mg | Freq: Once | INTRAVENOUS | Status: AC
  Administered 2015-01-13: 160 mg via INTRAVENOUS
  Filled 2015-01-13: qty 16

## 2015-01-13 MED ORDER — PNEUMOCOCCAL VAC POLYVALENT 25 MCG/0.5ML IJ INJ
0.5000 mL | INJECTION | INTRAMUSCULAR | Status: AC
  Administered 2015-01-14: 0.5 mL via INTRAMUSCULAR
  Filled 2015-01-13: qty 0.5

## 2015-01-13 MED ORDER — PROPOFOL 1000 MG/100ML IV EMUL
0.0000 ug/kg/min | INTRAVENOUS | Status: DC
  Administered 2015-01-13 (×2): 50 ug/kg/min via INTRAVENOUS
  Administered 2015-01-13: 48.928 ug/kg/min via INTRAVENOUS
  Administered 2015-01-14: 45 ug/kg/min via INTRAVENOUS
  Administered 2015-01-14: 40 ug/kg/min via INTRAVENOUS
  Administered 2015-01-14: 30 ug/kg/min via INTRAVENOUS
  Administered 2015-01-14: 25 ug/kg/min via INTRAVENOUS
  Administered 2015-01-14 (×2): 40 ug/kg/min via INTRAVENOUS
  Administered 2015-01-15: 10 ug/kg/min via INTRAVENOUS
  Administered 2015-01-16 (×2): 5 ug/kg/min via INTRAVENOUS
  Filled 2015-01-13: qty 100
  Filled 2015-01-13: qty 200
  Filled 2015-01-13: qty 100
  Filled 2015-01-13 (×2): qty 200
  Filled 2015-01-13 (×5): qty 100
  Filled 2015-01-13: qty 200

## 2015-01-13 MED ORDER — SODIUM CHLORIDE 0.9 % IV SOLN
25.0000 ug/h | INTRAVENOUS | Status: DC
  Administered 2015-01-14: 200 ug/h via INTRAVENOUS
  Administered 2015-01-15 – 2015-01-16 (×2): 100 ug/h via INTRAVENOUS
  Administered 2015-01-17: 50 ug/h via INTRAVENOUS
  Filled 2015-01-13 (×5): qty 50

## 2015-01-13 MED ORDER — VECURONIUM BROMIDE 10 MG IV SOLR
INTRAVENOUS | Status: AC
  Administered 2015-01-13: 10 mg
  Filled 2015-01-13: qty 10

## 2015-01-13 MED ORDER — FAMOTIDINE IN NACL 20-0.9 MG/50ML-% IV SOLN
20.0000 mg | INTRAVENOUS | Status: DC
  Administered 2015-01-13: 20 mg via INTRAVENOUS
  Filled 2015-01-13 (×2): qty 50

## 2015-01-13 MED ORDER — FENTANYL BOLUS VIA INFUSION
50.0000 ug | INTRAVENOUS | Status: DC | PRN
  Filled 2015-01-13: qty 50

## 2015-01-13 MED ORDER — CETYLPYRIDINIUM CHLORIDE 0.05 % MT LIQD
7.0000 mL | Freq: Four times a day (QID) | OROMUCOSAL | Status: DC
  Administered 2015-01-13 – 2015-01-19 (×25): 7 mL via OROMUCOSAL

## 2015-01-13 NOTE — Procedures (Signed)
Extubation Procedure Note  Patient Details:   Name: [name] DOB: [birth year] MRN: [id number]   Airway Documentation:  Airway 7 mm (Active)  Secured at (cm) 24 cm 01/13/2015  7:52 AM  Measured From Lips 01/13/2015  7:52 AM  Secured Location Center 01/13/2015  7:52 AM  Secured By [employer] Tube Holder 01/13/2015  7:52 AM  Tube Holder Repositioned Yes 01/13/2015  7:52 AM  Cuff Pressure (cm H2O) 26 cm H2O 01/13/2015  7:52 AM  Site Condition Dry 01/13/2015  7:52 AM   Pt extubated to 4lpm [place]. sats 97% no distress at this time. Evaluation  O2 sats: stable throughout Complications: No apparent complications Patient did not tolerate procedure well. Bilateral Breath Sounds: Diminished Suctioning: Oral, Airway Yes  Renae FickleScott, Joleah Kosak Michelle 01/13/2015, 11:46 AM

## 2015-01-13 NOTE — Procedures (Signed)
Intubation Procedure Note [name] [id number] September 27, 1950  Procedure: Intubation Indications: Respiratory insufficiency  Procedure Details Consent: Unable to obtain consent because of altered level of consciousness. Time Out: Verified patient identification, verified procedure, site/side was marked, verified correct patient position, special equipment/implants available, medications/allergies/relevent history reviewed, required imaging and test results available.  Performed  Maximum sterile technique was used including gloves and hand hygiene.  MAC and 4  Given diprivan, fentanyl, and 10 mg vecuronium.  Used glidescope.  Inserted 7.5 ETT to 23 cm at lip.  Try inserting #8 ETT, but airway too edematous to insert tube.  Evaluation Hemodynamic Status: BP stable throughout; O2 sats: transiently fell during during procedure Patient's Mclin Condition: stable Complications: No apparent complications Patient did tolerate procedure well. Chest X-ray ordered to verify placement.  CXR: pending.   Nicholas HellingVineet Dwayn Moravek, MD [facility] 01/13/2015, 3:51 PM Pager:  [phone] After 3pm call: [phone]

## 2015-01-13 NOTE — Progress Notes (Signed)
eLink Physician-Brief Progress Note Patient Name: [name] DOB: [birth year] MRN: [id number]   Date of Service  01/13/2015  HPI/Events of Note  ABG on 60%/PRVC 26/TV 600/P 5 = 7.25/47.9/83/20.8.  eICU Interventions  Increase ventilator rate to 30 and recheck ABG at 9 PM.     Intervention Category Major Interventions: Acid-Base disturbance - evaluation and management;Respiratory failure - evaluation and management  [name],[name] 01/13/2015, 7:54 PM

## 2015-01-13 NOTE — Progress Notes (Signed)
ANTICOAGULATION CONSULT NOTE - Follow Up  Pharmacy Consult for Heparin Indication: DVT  No Known Allergies  Patient Measurements: Height: 5\' 11"  (180.3 cm) Weight: (!) 353 lb (160.12 kg) IBW/kg (Calculated) : 75.3 Heparin Dosing Weight: 102.9 kg  Vital Signs: Temp: 97.7 F (36.5 C) (05/08 0731) Temp Source: Oral (05/08 0731) BP: 111/60 mmHg (05/08 0752) Pulse Rate: 70 (05/08 0600)  Labs:  Recent Labs  01/11/15 1600 01/12/15 0440 01/12/15 1030 01/13/15 0255 01/13/15 0345  HGB  --  12.9*  --  11.9*  --   HCT  --  39.8  --  35.8*  --   PLT  --  233  --  265  --   HEPARINUNFRC  --  0.48  --  1.70* 0.50  CREATININE 2.22*  --  3.00* 3.67*  --     Estimated Creatinine Clearance: 32.6 mL/min (by C-G formula based on Cr of 3.67).   Assessment: 63 YOM on IV heparin for bilateral DVTs. Heparin level drawn inappropriately this a.m. and resulted in supratherapeutic level of 1.7. Repeat heparin level 0.5 (therapeutic). CBC stable. No bleeding noted.    Goal of Therapy:  Heparin level 0.3-0.7 units/ml Monitor platelets by anticoagulation protocol: Yes  Plan:  Continue heparin at 1600 units/hr.  Daily heparin level and CBC.  Monitor for signs and symptoms of bleeding. F/u plan for oral anticoagulation.  Christoper Fabianaron Isidora Laham, PharmD, BCPS Clinical pharmacist, pager [phone] 01/13/2015,10:46 AM

## 2015-01-13 NOTE — Progress Notes (Signed)
PULMONARY / CRITICAL CARE MEDICINE   Name: [name] MRN: [id number] DOB: 20-Oct-1950    ADMISSION DATE:  12/28/2014 CONSULTATION DATE:  12/28/14   REFERRING MD :  Imogene BurnJenkins/TRH  PT PROFILE:  [age] morbidly obese male admitted 4/22 by Ortho for elective R rotator cuff tear. Intubated early 4/23 AM for post operative hypercarbic/hypoxic resp failure.  STUDIES:  4/26 ECHO >> nml LV, EF 60-65%, Grade 1 DD, trivial pericardial effusion without evidence of tamponade 4/27 us testes with masses 5/02 renal US >> no hydronephrosis, Possible nonobstructing stone lower pole left kidney 5/04 doppler legs >> b/l DVT  MAJOR EVENTS: 4/22  elective R shoulder surgery 4/23  reintubated for hypercarbic/hypoxic resp failure 4/23  US scrotum: Two solid extratesticular right scrotal masses. Primary differential diagnosis for extratesticular masses include primary or metastatic neoplasms including adenomatoid tumor, fibroma, leiomyoma, mesothelioma, or sarcoma. Urology consultation is recommended  [facility]04 [facility] 5/06 Off pressors  SUBJECTIVE:   Remains on sedation.  Tolerating pressure support.  Positive cuff leak.  VITAL SIGNS: Temp:  [97.4 F (36.3 C)-98 F (36.7 C)] 97.7 F (36.5 C) (05/08 0731) Pulse Rate:  [62-75] 70 (05/08 0600) Resp:  [9-22] 17 (05/08 0600) BP: (87-129)/(44-69) 111/60 mmHg (05/08 0752) SpO2:  [91 %-98 %] 94 % (05/08 0600) FiO2 (%):  [40 %] 40 % (05/08 0752)   VENTILATOR SETTINGS: Vent Mode:  [-] PRVC FiO2 (%):  [40 %] 40 % Set Rate:  [14 bmp] 14 bmp Vt Set:  [600 mL] 600 mL PEEP:  [5 cmH20] 5 cmH20 Pressure Support:  [5 cmH20-10 cmH20] 10 cmH20 Plateau Pressure:  [16 cmH20-26 cmH20] 21 cmH20   INTAKE / OUTPUT:  Intake/Output Summary (Last 24 hours) at 01/13/15 1009 Last data filed at 01/13/15 0600  Gross per 24 hour  Intake   1350 ml  Output    180 ml  Net   1170 ml    PHYSICAL EXAMINATION: General: no distress Neuro: RASS -2 HEENT: ETT in  place Cardiovascular: regular Lungs: no wheeze Abdomen: soft, non tender Ext: R shoulder surgical dressing, no edema, sling in place  LABS:  CBC  Recent Labs Lab 01/10/15 0527 01/12/15 0440 01/13/15 0255  WBC 12.3* 11.9* 13.5*  HGB 12.3* 12.9* 11.9*  HCT 38.5* 39.8 35.8*  PLT 196 233 265   Coag's  Recent Labs Lab 01/09/15 1510  APTT 25  INR 1.07     BMET  Recent Labs Lab 01/11/15 1600 01/12/15 1030 01/13/15 0255  NA 135 133* 131*  K 4.7 4.7 4.5  CL 100* 99* 95*  CO2 24 24 21*  BUN 66* 83* 105*  CREATININE 2.22* 3.00* 3.67*  GLUCOSE 192* 212* 182*   Electrolytes  Recent Labs Lab 01/10/15 0527  01/11/15 0330 01/11/15 1600 01/12/15 0440 01/12/15 1030 01/13/15 0255  CALCIUM 8.3*  8.3*  < > 8.1* 8.2*  --  8.5* 8.4*  MG 3.5*  --  2.8*  --  2.9*  --   --   PHOS 6.0*  < > 3.8 4.4  --  5.9* 7.8*  < > = values in this interval not displayed.   Liver Enzymes  Recent Labs Lab 01/11/15 1600 01/12/15 1030 01/13/15 0255  ALBUMIN 2.6* 2.4* 2.4*   Cardiac Enzymes  Recent Labs Lab 01/06/15 1346 01/06/15 2010  TROPONINI 0.39* 0.36*   Glucose  Recent Labs Lab 01/12/15 1312 01/12/15 1554 01/12/15 2030 01/13/15 0026 01/13/15 0446 01/13/15 0733  GLUCAP 170* 194* 238* 186* 159* 169*  CXR:  Dg Chest Port 1 View  01/13/2015   CLINICAL DATA:  Respiratory failure.  Intubated.  EXAM: PORTABLE CHEST - 1 VIEW  COMPARISON:  Yesterday.  FINDINGS: Endotracheal tube in satisfactory position. Left PICC tip at the superior cavoatrial junction. Stable enlarged cardiac silhouette and prominent pulmonary vasculature and interstitial markings. No definite pleural fluid. Nasogastric tube extending into the stomach. Thoracic spine degenerative changes.  IMPRESSION: Stable cardiomegaly, pulmonary vascular congestion and chronic interstitial lung disease.   Electronically Signed   By: Beckie SaltsSteven  Reid M.D.   On: 01/13/2015 07:45   Dg Chest Port 1 View  01/12/2015    CLINICAL DATA:  Respiratory failure  EXAM: PORTABLE CHEST - 1 VIEW  COMPARISON:  01/10/2015  FINDINGS: The endotracheal tube is 5.9 cm above the carina. The left upper extremity PICC line extends into the SVC. The nasogastric tube extends below the diaphragm and off the inferior edge of the image. There is unchanged cardiomegaly. There is mild vascular prominence, unchanged.  IMPRESSION: Support equipment appears satisfactorily positioned.  Unchanged cardiomegaly and vascular prominence.   Electronically Signed   By: Ellery Plunkaniel R Mitchell M.D.   On: 01/12/2015 07:07      ASSESSMENT / PLAN:  PULMONARY A:  Acute hypoxic/hypercapnic respiratory. Possible OSA/OHS. Tobacco abuse. P:   Pressure support wean as tolerated >> consider extubation trial 5/08 Defer trach for now F/u CXR BD's Added solumedrol 5/06 to improve upper airway edema >> complete doses 5/08  CARDIOVASCULAR A:  Hx of HTN and chronic diastolic heart failure. Elevated troponin >> demand ischemia. Hx of HLD. VT episode 4/26. B/l lower extremity DVT and probable PE. P:  Continue ASA Monitor hemodynamics Heparin gtt for now  RENAL A:   AKI 2nd to ATN. Scrotal mass >> LDH elevated, AFP negative. P:   Transitioning off CRRT 5/06 Will need CT chest/abd/pelvis and urology evaluation when more stable Check HCG from 5/05  GASTROINTESTINAL A:  Nutrition. Increased gastric residuals. Constipation. P:   Tube feeds on hold  If unable to extubate, then add reglan SMOG  Pepcid for SUP  HEMATOLOGIC A:   Polycythemia - likely chronic hypoxemia >> improved. P:  F/u CBC  INFECTIOUS A:   HCAP >> completed abx 5/05. P:  Monitor off Abx  ENDOCRINE A:   Hyperglycemia. P:   SSI  NEUROLOGIC A:  Acute metabolic encephalopathy. P:   RASS goal 0 PRN fentanyl for pain control  ORTHO A: Rt shoulder surgery 4/22. P: Per ortho  Summary: Might be ready for extubation trial 5/08.  CC time 35 minutes.  Coralyn HellingVineet  Sabrie Moritz, MD [facility]Critical [facility] 01/13/2015, 10:09 AM Pager:  [phone] After 3pm call: [phone]

## 2015-01-13 NOTE — Progress Notes (Signed)
Occupational Therapy Treatment Patient Details Name: Fayrene FearingJames Laszlo MRN: [id number] DOB: 02-20-1951 Today's Date: 01/13/2015    History of present illness [facility] or elective Rt RCR.  He was intubated 4/23 am for post op hypercarbic resp failure.  US testes show masses - work up to be one by urology; 5/4 dopplers showed bil. LE DVTs; 5/4 [facility].  PMH includes morbid obesity, arthritis    OT comments  Pt tolerated PROM Rt elbow, wrist and hand.  Skin without evidence off compromise Rt UE   Follow Up Recommendations  LTACH    Equipment Recommendations  Other (comment)    Recommendations for Other Services      Precautions / Restrictions Precautions Precautions: Fall;Shoulder;Other (comment) (ETT, CRRT, mulitple lines and tubes ) Type of Shoulder Precautions: Abduction sling in place at all ties.  PROM Rt elbow, wrist, and hand daily  Shoulder Interventions: Shoulder abduction pillow Precaution Comments: shoulder, abductor sling on RUE, except for PROM to elbow and wrist; then removed for this per MD Restrictions Weight Bearing Restrictions: Yes RUE Weight Bearing: Non weight bearing       Mobility Bed Mobility                  Transfers                      Balance                                   ADL                                         General ADL Comments: Sking without compromise.  Pt unable to engage in ADLs       Vision                     Perception     Praxis      Cognition   Behavior During Therapy: Flat affect Overall Cognitive Status: Difficult to assess                       Extremity/Trunk Assessment               Exercises General Exercises - Upper Extremity Elbow Flexion: PROM;Right;Supine;15 reps Elbow Extension: PROM;Right;Supine;15 reps Wrist Flexion: PROM;Right;Supine;15 reps Wrist Extension: PROM;Right;Supine;15  reps Digit Composite Flexion: PROM;Right;Supine;15 reps Composite Extension: PROM;Right;Supine;15 reps   Shoulder Instructions       General Comments      Pertinent Vitals/ Pain       Pain Assessment: Faces Faces Pain Scale: No hurt (Pt sedated )  Home Living                                          Prior Functioning/Environment              Frequency Min 6X/week     Progress Toward Goals  OT Goals([place] goals can now be found in the care plan section)  Progress towards OT goals: Not progressing toward goals - comment  ADL Goals Additional ADL Goal #1: pt will tolerate sling without skin compromise, as noted  during PROM to elbow and wrist and RUE ADLs  Plan Discharge plan needs to be updated    Co-evaluation                 End of Session Equipment Utilized During Treatment: Other (comment) (abduction sling )   Activity Tolerance Patient tolerated treatment well   Patient Left in bed;with nursing/sitter in room   Nurse Communication          Time: [id number] OT Time Calculation (min): 15 min  Charges:    Jeani Hawkingonarpe, Renate Danh M 01/13/2015, 5:16 PM

## 2015-01-13 NOTE — Progress Notes (Signed)
Pt extubated earlier this AM.  Mental status progressively worse throughout the day.  Was tried on BiPAP >> had episodes of vomiting.  ABG showed respiratory acidosis.  Re-intubated >> difficult airway.  Will need to ask ENT to revisit arranging tracheostomy later this week.  Coralyn HellingVineet Alven Alverio, MD [facility] 01/13/2015, 3:54 PM Pager:  [phone] After 3pm call: [phone]

## 2015-01-13 NOTE — Progress Notes (Signed)
[name]   Subjective:  UOP 180 cc yesterday,, I/O total  1.5L in and 180 cc out  Filed Vitals:   01/13/15 0500 01/13/15 0600 01/13/15 0731 01/13/15 0752  BP: 96/50 101/52  111/60  Pulse: 69 70    Temp:   97.7 F (36.5 C)   TempSrc:   Oral   Resp: 14 17    Height:      Weight:      SpO2: 91% 94%     Exam: On vent, eyes closed No jvd Chest clear ant and lat RRR no rub or gallop Abd very obese, ND NT +BS x 4 Temp L groin HD cath in place GU foley in place draining dark brown urine Ext mild-moderate diffuse edema of LE's and UE's Neuro is sedated on the vent  Renal US 11-13 cm kidneys , no hydro UA 5.0, 1.020, prot 30, rbc 21-50/hpf, wbc 0-2/hpf UNa 30, UCreat 230 CXR 5/8 no chg mild vasc congestion, chronic IS pattern  Summary: 64 yo w DJD/ obesity underwent elective R rotator cuff repair surgery on 4/22. Postop had hypercarbic resp failure/ HCAP and septic shock rx w vasopressors and IV vanc/cefipime. Developed AKI 4/30 while still on pressors and IV abx. Vanc level on 5/1 elevated at 43 ug/mL. RRT started on 5/4 w CRRT from 5/4 - 5/7. Oliguric renal failure.   Assessment: 1. AKI - ATN due to shock/ vanc toxicity. S/P CRRT 5/4-5/7. Is making some urine, B/Cr rising off CRRT. Creat 3.67 today.  2. Vol excess moderate diffuse edema 3. Shock / fevers - all cx's negative, s/p 2 wks abx and off pressors. BP's soft.  4. S/P R rotator cuff repair 4/22 5. Morbid obesity 6. VDRF 7. Bilat LE DVT / suspected PE - on IV hep  Plan - cont to watch, will need HD soon if not recovering. Limit volume as much as possible.     Nicholas Moselleob Zaedyn Covin MD  pager [phone]    cell [phone]  01/13/2015, 9:59 AM     Recent Labs Lab 01/11/15 1600 01/12/15 1030 01/13/15 0255  NA 135 133* 131*  K 4.7 4.7 4.5  CL 100* 99* 95*  CO2 24 24 21*  GLUCOSE 192* 212* 182*  BUN 66* 83* 105*  CREATININE 2.22* 3.00* 3.67*  CALCIUM 8.2* 8.5* 8.4*  PHOS 4.4 5.9* 7.8*     Recent Labs Lab 01/11/15 1600 01/12/15 1030 01/13/15 0255  ALBUMIN 2.6* 2.4* 2.4*    Recent Labs Lab 01/10/15 0527 01/12/15 0440 01/13/15 0255  WBC 12.3* 11.9* 13.5*  HGB 12.3* 12.9* 11.9*  HCT 38.5* 39.8 35.8*  MCV 97.7 96.6 95.0  PLT 196 233 265   . alteplase  2 mg Intracatheter Once  . antiseptic oral rinse  7 mL Mouth Rinse QID  . aspirin  325 mg Oral Daily  . chlorhexidine  15 mL Mouth Rinse BID  . famotidine  20 mg Per Tube Daily  . feeding supplement (PRO-STAT SUGAR FREE 64)  60 mL Oral QID  . feeding supplement (VITAL HIGH PROTEIN)  1,000 mL Per Tube Q24H  . folic acid  1 mg Per Tube Daily  . insulin aspart  0-15 Units Subcutaneous 6 times per day  . insulin glargine  10 Units Subcutaneous BID  . [START ON 01/14/2015] pneumococcal 23 valent vaccine  0.5 mL Intramuscular Tomorrow-1000  . sodium chloride  10-40 mL Intracatheter Q12H   . sodium chloride 10 mL/hr at 01/13/15 0828  . fentaNYL  infusion INTRAVENOUS 150 mcg/hr (01/12/15 [id number])  . heparin 1,600 Units/hr (01/13/15 0931)  . phenylephrine (NEO-SYNEPHRINE) Adult infusion Stopped (01/11/15 0900)  . propofol (DIPRIVAN) infusion 25 mcg/kg/min (01/13/15 0824)   acetaminophen (TYLENOL) oral liquid 160 mg/5 mL, fentaNYL, heparin, hydrALAZINE, ipratropium-albuterol, lip balm, midazolam, [DISCONTINUED] ondansetron **OR** ondansetron (ZOFRAN) IV, sodium chloride

## 2015-01-13 NOTE — Progress Notes (Signed)
eLink Physician-Brief Progress Note Patient Name: [name] Garbutt DOB: 12-22-1950 MRN: [id number]   Date of Service  01/13/2015  HPI/Events of Note  ABG on 100%/PRVC 18/TV 600/P 5 = 7.19/63/166/24.  eICU Interventions  Will increase rate to 26 and re-check ABG at 7:30 PM.      Intervention Category Major Interventions: Respiratory failure - evaluation and management  Sommer,Steven Eugene 01/13/2015, 5:54 PM

## 2015-01-13 NOTE — Progress Notes (Signed)
eLink Physician-Brief Progress Note Patient Name: [name] Harnack DOB: 1951/05/08 MRN: [id number]   Date of Service  01/13/2015  HPI/Events of Note  ABG on 60%/PRVC 30/TV 600/P 5 = 7.36/35.9/94.9  eICU Interventions  Continue Snell ventilator management.      Intervention Category Major Interventions: Respiratory failure - evaluation and management;Acid-Base disturbance - evaluation and management  Sommer,Steven Eugene 01/13/2015, 10:27 PM

## 2015-01-14 ENCOUNTER — Inpatient Hospital Stay (HOSPITAL_COMMUNITY): Payer: Worker's Compensation

## 2015-01-14 DIAGNOSIS — I82403 Acute embolism and thrombosis of unspecified deep veins of lower extremity, bilateral: Secondary | ICD-10-CM

## 2015-01-14 LAB — RENAL FUNCTION PANEL
ALBUMIN: 2.7 g/dL — AB (ref 3.5–5.0)
ANION GAP: 19 — AB (ref 5–15)
BUN: 154 mg/dL — ABNORMAL HIGH (ref 6–20)
CHLORIDE: 96 mmol/L — AB (ref 101–111)
CO2: 21 mmol/L — AB (ref 22–32)
CREATININE: 4.89 mg/dL — AB (ref 0.61–1.24)
Calcium: 9 mg/dL (ref 8.9–10.3)
GFR calc Af Amer: 13 mL/min — ABNORMAL LOW (ref >60)
GFR calc non Af Amer: 12 mL/min — ABNORMAL LOW (ref >60)
Glucose, Bld: 181 mg/dL — ABNORMAL HIGH (ref 70–99)
Phosphorus: 6.8 mg/dL — ABNORMAL HIGH (ref 2.5–4.6)
Potassium: 3.6 mmol/L (ref 3.5–5.1)
Sodium: 136 mmol/L (ref 135–145)

## 2015-01-14 LAB — CBC
HCT: 37.8 % — ABNORMAL LOW (ref 39.0–52.0)
Hemoglobin: 12.8 g/dL — ABNORMAL LOW (ref 13.0–17.0)
MCH: 31.2 pg (ref 26.0–34.0)
MCHC: 33.9 g/dL (ref 30.0–36.0)
MCV: 92.2 fL (ref 78.0–100.0)
Platelets: 335 10*3/uL (ref 150–400)
RBC: 4.1 MIL/uL — AB (ref 4.22–5.81)
RDW: 15.8 % — ABNORMAL HIGH (ref 11.5–15.5)
WBC: 16.1 10*3/uL — AB (ref 4.0–10.5)

## 2015-01-14 LAB — GLUCOSE, CAPILLARY
GLUCOSE-CAPILLARY: 177 mg/dL — AB (ref 70–99)
GLUCOSE-CAPILLARY: 137 mg/dL — AB (ref 70–99)
Glucose-Capillary: 136 mg/dL — ABNORMAL HIGH (ref 70–99)
Glucose-Capillary: 143 mg/dL — ABNORMAL HIGH (ref 70–99)
Glucose-Capillary: 180 mg/dL — ABNORMAL HIGH (ref 70–99)
Glucose-Capillary: 203 mg/dL — ABNORMAL HIGH (ref 70–99)
Glucose-Capillary: 241 mg/dL — ABNORMAL HIGH (ref 70–99)

## 2015-01-14 LAB — HEPATITIS B SURFACE ANTIBODY,QUALITATIVE: HEP B S AB: NEGATIVE

## 2015-01-14 LAB — HEPATITIS B SURFACE ANTIGEN: Hepatitis B Surface Ag: NEGATIVE

## 2015-01-14 LAB — HEPARIN LEVEL (UNFRACTIONATED): HEPARIN UNFRACTIONATED: 0.42 [IU]/mL (ref 0.30–0.70)

## 2015-01-14 MED ORDER — VITAL HIGH PROTEIN PO LIQD
1000.0000 mL | ORAL | Status: DC
  Filled 2015-01-14 (×2): qty 1000

## 2015-01-14 MED ORDER — NEPRO/CARBSTEADY PO LIQD
237.0000 mL | ORAL | Status: DC | PRN
  Filled 2015-01-14: qty 237

## 2015-01-14 MED ORDER — HEPARIN SODIUM (PORCINE) 1000 UNIT/ML DIALYSIS: 1000.0000 [IU] | INTRAMUSCULAR | Status: DC | PRN

## 2015-01-14 MED ORDER — FOLIC ACID 1 MG PO TABS
1.0000 mg | ORAL_TABLET | Freq: Every day | ORAL | Status: DC
Start: 2015-01-14 — End: 2015-01-19
  Administered 2015-01-14 – 2015-01-19 (×4): 1 mg
  Filled 2015-01-14 (×6): qty 1

## 2015-01-14 MED ORDER — INSULIN GLARGINE 100 UNIT/ML ~~LOC~~ SOLN
15.0000 [IU] | Freq: Two times a day (BID) | SUBCUTANEOUS | Status: DC
  Administered 2015-01-14 – 2015-01-19 (×8): 15 [IU] via SUBCUTANEOUS
  Filled 2015-01-14 (×12): qty 0.15

## 2015-01-14 MED ORDER — PENTAFLUOROPROP-TETRAFLUOROETH EX AERO: 1.0000 "application " | INHALATION_SPRAY | CUTANEOUS | Status: DC | PRN

## 2015-01-14 MED ORDER — ERYTHROMYCIN ETHYLSUCCINATE 400 MG/5ML PO SUSR
400.0000 mg | Freq: Three times a day (TID) | ORAL | Status: DC
  Administered 2015-01-14 – 2015-01-15 (×5): 400 mg
  Filled 2015-01-14 (×8): qty 5

## 2015-01-14 MED ORDER — INSULIN ASPART 100 UNIT/ML ~~LOC~~ SOLN
0.0000 [IU] | SUBCUTANEOUS | Status: DC
  Administered 2015-01-14 (×3): 3 [IU] via SUBCUTANEOUS
  Administered 2015-01-15: 4 [IU] via SUBCUTANEOUS
  Administered 2015-01-15 (×2): 7 [IU] via SUBCUTANEOUS
  Administered 2015-01-15: 4 [IU] via SUBCUTANEOUS
  Administered 2015-01-15: 7 [IU] via SUBCUTANEOUS
  Administered 2015-01-15: 4 [IU] via SUBCUTANEOUS
  Administered 2015-01-15: 7 [IU] via SUBCUTANEOUS
  Administered 2015-01-16: 3 [IU] via SUBCUTANEOUS
  Administered 2015-01-16: 4 [IU] via SUBCUTANEOUS
  Administered 2015-01-16: 7 [IU] via SUBCUTANEOUS
  Administered 2015-01-17 (×2): 3 [IU] via SUBCUTANEOUS
  Administered 2015-01-17 (×4): 4 [IU] via SUBCUTANEOUS
  Administered 2015-01-18: 3 [IU] via SUBCUTANEOUS
  Administered 2015-01-18 (×2): 4 [IU] via SUBCUTANEOUS
  Administered 2015-01-18 – 2015-01-19 (×8): 3 [IU] via SUBCUTANEOUS

## 2015-01-14 MED ORDER — SODIUM CHLORIDE 0.9 % IV SOLN: 100.0000 mL | INTRAVENOUS | Status: DC | PRN

## 2015-01-14 MED ORDER — LIDOCAINE-PRILOCAINE 2.5-2.5 % EX CREA
1.0000 "application " | TOPICAL_CREAM | CUTANEOUS | Status: DC | PRN
  Filled 2015-01-14: qty 5

## 2015-01-14 MED ORDER — ASPIRIN 81 MG PO CHEW
81.0000 mg | CHEWABLE_TABLET | Freq: Every day | ORAL | Status: DC
  Administered 2015-01-14 – 2015-01-19 (×4): 81 mg
  Filled 2015-01-14 (×4): qty 1

## 2015-01-14 MED ORDER — LIDOCAINE HCL (PF) 1 % IJ SOLN
5.0000 mL | INTRAMUSCULAR | Status: DC | PRN
Start: 2015-01-14 — End: 2015-01-19

## 2015-01-14 MED ORDER — VITAL HIGH PROTEIN PO LIQD: 1000.0000 mL | ORAL | Status: DC

## 2015-01-14 MED ORDER — PRO-STAT SUGAR FREE PO LIQD
30.0000 mL | Freq: Two times a day (BID) | ORAL | Status: DC
  Administered 2015-01-14: 30 mL
  Filled 2015-01-14 (×2): qty 30

## 2015-01-14 MED ORDER — FAMOTIDINE 40 MG/5ML PO SUSR
20.0000 mg | Freq: Every day | ORAL | Status: DC
  Administered 2015-01-14 – 2015-01-19 (×4): 20 mg
  Filled 2015-01-14 (×6): qty 2.5

## 2015-01-14 MED ORDER — HEPARIN SODIUM (PORCINE) 1000 UNIT/ML DIALYSIS: 20.0000 [IU]/kg | INTRAMUSCULAR | Status: DC | PRN

## 2015-01-14 MED ORDER — ALTEPLASE 2 MG IJ SOLR
2.0000 mg | Freq: Once | INTRAMUSCULAR | Status: AC | PRN
  Filled 2015-01-14: qty 2

## 2015-01-14 MED ORDER — PRO-STAT SUGAR FREE PO LIQD
60.0000 mL | Freq: Four times a day (QID) | ORAL | Status: DC
  Administered 2015-01-14 – 2015-01-19 (×16): 60 mL
  Filled 2015-01-14 (×24): qty 60

## 2015-01-14 MED ORDER — VITAL HIGH PROTEIN PO LIQD
1000.0000 mL | ORAL | Status: DC
  Administered 2015-01-14: 1000 mL
  Administered 2015-01-16: 22:00:00
  Filled 2015-01-14 (×6): qty 1000

## 2015-01-14 MED ORDER — ALBUMIN HUMAN 25 % IV SOLN
25.0000 g | Freq: Once | INTRAVENOUS | Status: AC
  Administered 2015-01-14: 25 g via INTRAVENOUS
  Filled 2015-01-14: qty 100

## 2015-01-14 MED ORDER — ACETAMINOPHEN 160 MG/5ML PO SOLN: 650.0000 mg | Freq: Four times a day (QID) | ORAL | Status: DC | PRN

## 2015-01-14 NOTE — Procedures (Signed)
Patient was seen on dialysis and the procedure was supervised.  BFR 300  Via PC BP is  94/53.   Patient appears to be tolerating treatment well- blood pressure is a little low  [name] A 01/14/2015

## 2015-01-14 NOTE — Progress Notes (Signed)
[facility] Progress Note   Subjective:  Events noted- was extubated but required re intubation due to resp acidosis- making good urine but BUN increased to 154 this AM  Filed Vitals:   01/14/15 0530 01/14/15 0600 01/14/15 0630 01/14/15 0700  BP: 107/60 101/60 118/63 96/54  Pulse: 67  64 66  Temp:      TempSrc:      Resp: 30 30 30 30   Height:      Weight:      SpO2: 97%  97% 95%   Exam: On vent, eyes closed- sedated  No jvd Chest clear ant and lat RRR no rub or gallop Abd very obese, ND NT +BS x 4 Temp L groin HD cath in place GU foley in place draining dark brown urine Ext mild-moderate diffuse edema of LE's and UE's Neuro is sedated on the vent  Renal US 11-13 cm kidneys , no hydro UA 5.0, 1.020, prot 30, rbc 21-50/hpf, wbc 0-2/hpf UNa 30, UCreat 230 CXR 5/8 no chg mild vasc congestion, chronic IS pattern  Summary: 64 yo w DJD/ obesity underwent elective R rotator cuff repair surgery on 4/22. Normal creatinine at baseline. Postop had hypercarbic resp failure/ HCAP and septic shock rx w vasopressors and IV vanc/cefipime. Developed AKI 4/30 while still on pressors and IV abx. Vanc level on 5/1 elevated at 43 ug/mL. RRT started on 5/4 w CRRT from 5/4 - 5/7.   Assessment: 1. AKI - ATN due to shock/ vanc toxicity. S/P CRRT 5/4-5/7. Is making better urine, however, is catabolic with now BUN of 150 - will do HD today via vascath left femoral placed 5/4 2. Vol excess moderate diffuse edema- volume removal difficult due to low BP's 3. Shock / fevers - all cx's negative, s/p 2 wks abx and off pressors. BP's soft but not sure reliable due to size.  4. S/P R rotator cuff repair 4/22 5. Morbid obesity 6. VDRF- needed to be reintubated last night 7. Bilat LE DVT / suspected PE - on IV hep 8. Hyperphos- no treatment yet 9. Hypokalemia- will run on 4 K bath today       Rankin Coolman A   01/14/2015, 7:26 AM     Recent Labs Lab 01/12/15 1030 01/13/15 0255  01/14/15 0456  NA 133* 131* 136  K 4.7 4.5 3.6  CL 99* 95* 96*  CO2 24 21* 21*  GLUCOSE 212* 182* 181*  BUN 83* 105* 154*  CREATININE 3.00* 3.67* 4.89*  CALCIUM 8.5* 8.4* 9.0  PHOS 5.9* 7.8* 6.8*    Recent Labs Lab 01/12/15 1030 01/13/15 0255 01/14/15 0456  ALBUMIN 2.4* 2.4* 2.7*    Recent Labs Lab 01/12/15 0440 01/13/15 0255 01/14/15 0457  WBC 11.9* 13.5* 16.1*  HGB 12.9* 11.9* 12.8*  HCT 39.8 35.8* 37.8*  MCV 96.6 95.0 92.2  PLT 233 265 335   . alteplase  2 mg Intracatheter Once  . antiseptic oral rinse  7 mL Mouth Rinse QID  . aspirin  325 mg Oral Daily  . chlorhexidine  15 mL Mouth Rinse BID  . famotidine (PEPCID) IV  20 mg Intravenous Q24H  . folic acid  1 mg Intravenous Daily  . insulin aspart  0-15 Units Subcutaneous 6 times per day  . insulin glargine  10 Units Subcutaneous BID  . pneumococcal 23 valent vaccine  0.5 mL Intramuscular Tomorrow-1000  . sodium chloride  10-40 mL Intracatheter Q12H   . sodium chloride 10 mL/hr at 01/13/15 0828  . fentaNYL  infusion INTRAVENOUS 200 mcg/hr (01/14/15 0600)  . heparin 1,600 Units/hr (01/14/15 0205)  . phenylephrine (NEO-SYNEPHRINE) Adult infusion Stopped (01/11/15 0900)  . propofol (DIPRIVAN) infusion 40 mcg/kg/min (01/14/15 0600)   acetaminophen (TYLENOL) oral liquid 160 mg/5 mL, fentaNYL, heparin, hydrALAZINE, ipratropium-albuterol, lip balm, midazolam, [DISCONTINUED] ondansetron **OR** ondansetron (ZOFRAN) IV, sodium chloride

## 2015-01-14 NOTE — Progress Notes (Signed)
Utilization review completed.  

## 2015-01-14 NOTE — Progress Notes (Signed)
NUTRITION FOLLOW-UP / CONSULT  INTERVENTION:  Resume TF via OGT with Vital High Protein at goal rate of 35 ml/h (840 ml per day) with Prostat 60 ml QID to provide 1640 kcals, 194 gm protein (2.5 gm/kg ideal weight), 702 ml free water daily  Above TF regimen plus Urbanik kcals from Propofol will provide a total of 2147 kcals (14.2 kcals/kg actual weight)   NUTRITION DIAGNOSIS: Inadequate oral intake related to inability to eat as evidenced by NPO status, ongoing.  Goal: Enteral nutrition to provide nutrition needs based on ASPEN guidelines for hypocaloric, high protein feeding in critically ill obese individuals, met.  Monitor:  TF regimen & tolerance, respiratory status, weight, labs  ASSESSMENT: 64 y/o with h/o HTN who was admitted to step down after difficulty extubating after rotator cuff repair.   S/p 4/22 Procedure(s) (LRB): RIGHT SHOULDER ARTHROSCOPY WITH SUBACROMIAL DECOMPRESSION, DISTAL CLAVICLE RESECTION, ROTATOR CUFF REPAIR (Right)  Patient was transferred from [facility] to [facility] on 5/4 for CRRT. Now receiving HD as needed, for HD today via vascath placed on 5/4. Volume removal difficult due to low blood pressures. Patient was extubated on 5/8, but required reintubation later that day. TF on hold since extubation. Received MD Consult for TF initiation and management.  Patient is currently intubated on ventilator support MV: 12.2 L/min Temp (24hrs), Avg:97.6 F (36.4 C), Min:97.5 F (36.4 C), Max:97.9 F (36.6 C)  Propofol: 19.2 ml/h providing 507 kcals per day.  Labs reviewed. Phosphorus elevated at 6.8.   Height: Ht Readings from Last 1 Encounters:  01/07/15 $RemoveB'5\' 11"'MGHuHFFt$  (1.803 m)    Weight: Wt Readings from Last 1 Encounters:  01/12/15 353 lb (160.12 kg)   01/11/15 348 lb (157.852 kg)       01/07/15 151.5 kg 01/02/15 154 kg 12/28/14 173 kg  Ideal Body Weight: 78.2 kg  BMI:  Body mass index is 49.26 kg/(m^2).  Re-estimated Nutritional Needs: Kcal:  2482-5003 Protein: >195 g Fluid: 2 L  Skin: shoulder incision, scrotum wound  Diet Order: NPO  EDUCATION NEEDS: -No education needs identified at this time   Intake/Output Summary (Last 24 hours) at 01/14/15 1037 Last data filed at 01/14/15 0900  Gross per 24 hour  Intake 2867.2 ml  Output   2085 ml  Net  782.2 ml    Last BM: 5/8  Labs:   Recent Labs Lab 01/10/15 0527  01/11/15 0330  01/12/15 0440 01/12/15 1030 01/13/15 0255 01/14/15 0456  NA 141  141  < > 137  < >  --  133* 131* 136  K 4.0  4.0  < > 3.8  < >  --  4.7 4.5 3.6  CL 101  102  < > 99*  < >  --  99* 95* 96*  CO2 25  26  < > 25  < >  --  24 21* 21*  BUN 162*  165*  < > 75*  < >  --  83* 105* 154*  CREATININE 4.61*  4.71*  < > 2.41*  < >  --  3.00* 3.67* 4.89*  CALCIUM 8.3*  8.3*  < > 8.1*  < >  --  8.5* 8.4* 9.0  MG 3.5*  --  2.8*  --  2.9*  --   --   --   PHOS 6.0*  < > 3.8  < >  --  5.9* 7.8* 6.8*  GLUCOSE 152*  154*  < > 127*  < >  --  212* 182* 181*  < > =  values in this interval not displayed.  CBG (last 3)   Recent Labs  01/13/15 2349 01/14/15 0413 01/14/15 0746  GLUCAP 241* 177* 180*    Scheduled Meds: . alteplase  2 mg Intracatheter Once  . antiseptic oral rinse  7 mL Mouth Rinse QID  . aspirin  81 mg Per Tube Daily  . chlorhexidine  15 mL Mouth Rinse BID  . erythromycin  400 mg Per Tube TID  . famotidine  20 mg Per Tube Daily  . feeding supplement (PRO-STAT SUGAR FREE 64)  30 mL Per Tube BID  . feeding supplement (VITAL HIGH PROTEIN)  1,000 mL Per Tube Q24H  . folic acid  1 mg Per Tube Daily  . insulin aspart  0-20 Units Subcutaneous 6 times per day  . insulin glargine  15 Units Subcutaneous BID  . pneumococcal 23 valent vaccine  0.5 mL Intramuscular Tomorrow-1000  . sodium chloride  10-40 mL Intracatheter Q12H    Continuous Infusions: . sodium chloride 10 mL/hr at 01/13/15 0828  . fentaNYL infusion INTRAVENOUS 100 mcg/hr (01/14/15 0800)  . heparin 1,600 Units/hr  (01/14/15 0205)  . propofol (DIPRIVAN) infusion 20 mcg/kg/min (01/14/15 0800)     Molli Barrows, RD, LDN, [place] Pager [phone] After Hours Pager [phone]

## 2015-01-14 NOTE — Progress Notes (Signed)
ANTICOAGULATION CONSULT NOTE - Follow Up  Pharmacy Consult:  Heparin Indication:  New bilateral femoral DVTs  No Known Allergies  Patient Measurements: Height: 5\' 11"  (180.3 cm) Weight: (!) 353 lb (160.12 kg) IBW/kg (Calculated) : 75.3 Heparin Dosing Weight: 103 kg  Vital Signs: Temp: 97.5 F (36.4 C) (05/09 0428) Temp Source: Oral (05/09 0428) BP: 96/54 mmHg (05/09 0700) Pulse Rate: 66 (05/09 0700)  Labs:  Recent Labs  01/12/15 0440 01/12/15 1030 01/13/15 0255 01/13/15 0345 01/14/15 0456 01/14/15 0457  HGB 12.9*  --  11.9*  --   --  12.8*  HCT 39.8  --  35.8*  --   --  37.8*  PLT 233  --  265  --   --  335  HEPARINUNFRC 0.48  --  1.70* 0.50  --  0.42  CREATININE  --  3.00* 3.67*  --  4.89*  --     Estimated Creatinine Clearance: 24.5 mL/min (by C-G formula based on Cr of 4.89).    Assessment: 63 YOM on IV heparin for bilateral DVTs.  Heparin level therapeutic and stable; no bleeding reported.    Goal of Therapy:  Heparin level 0.3-0.7 units/ml Monitor platelets by anticoagulation protocol: Yes   Plan:  - Continue heparin at 1600 units/hr - Daily heparin level and CBC - F/u plan for oral anticoagulation post trach    Jordis Repetto D. Laney Potashang, PharmD, BCPS Pager:  [phone] 01/14/2015, 9:55 AM

## 2015-01-14 NOTE — Progress Notes (Signed)
PULMONARY / CRITICAL CARE MEDICINE   Name: [name] Cancel MRN: [id number] DOB: February 26, 1951    ADMISSION DATE:  12/28/2014 CONSULTATION DATE:  12/28/14   REFERRING MD :  Imogene BurnJenkins/TRH  PT PROFILE:  [id number] morbidly obese male admitted 4/22 by Ortho for elective R rotator cuff tear. Intubated early 4/23 AM for post operative hypercarbic/hypoxic resp failure.  STUDIES:  4/26 ECHO >> nml LV, EF 60-65%, Grade 1 DD, trivial pericardial effusion without evidence of tamponade 4/27 us testes with masses 5/02 renal US >> no hydronephrosis, Possible nonobstructing stone lower pole left kidney 5/04 doppler legs >> b/l DVT  MAJOR EVENTS: 4/22  elective R shoulder surgery 4/23  reintubated for hypercarbic/hypoxic resp failure 4/23  US scrotum: Two solid extratesticular right scrotal masses. Primary differential diagnosis for extratesticular masses include primary or metastatic neoplasms including adenomatoid tumor, fibroma, leiomyoma, mesothelioma, or sarcoma. Urology consultation is recommended  5/04 [facility] 5/06 Off pressors. Transitioned off CRRT 5/08 Failed extubation attempt 5/09 ENT consult for trach tube placement  SUBJECTIVE:   Remains on sedation. RASS -3 to -4. Not F/C  VITAL SIGNS: Temp:  [97.5 F (36.4 C)-98 F (36.7 C)] 98 F (36.7 C) (05/09 1111) Pulse Rate:  [61-106] 73 (05/09 1405) Resp:  [15-30] 19 (05/09 1405) BP: (81-164)/(39-94) 99/53 mmHg (05/09 1400) SpO2:  [83 %-99 %] 94 % (05/09 1405) FiO2 (%):  [40 %-100 %] 40 % (05/09 1300)   VENTILATOR SETTINGS: Vent Mode:  [-] PRVC FiO2 (%):  [40 %-100 %] 40 % Set Rate:  [18 bmp-30 bmp] 20 bmp Vt Set:  [600 mL] 600 mL PEEP:  [5 cmH20] 5 cmH20 Plateau Pressure:  [19 cmH20-24 cmH20] 19 cmH20   INTAKE / OUTPUT:  Intake/Output Summary (Last 24 hours) at 01/14/15 1415 Last data filed at 01/14/15 1200  Gross per 24 hour  Intake 2138.2 ml  Output   2145 ml  Net   -6.8 ml    PHYSICAL EXAMINATION: General: no  distress Neuro: RASS -3 HEENT: ETT in place Cardiovascular: regular Lungs: no wheeze Abdomen: soft, non tender Ext: R shoulder surgical dressing, no edema, sling in place  LABS:  CBC  Recent Labs Lab 01/12/15 0440 01/13/15 0255 01/14/15 0457  WBC 11.9* 13.5* 16.1*  HGB 12.9* 11.9* 12.8*  HCT 39.8 35.8* 37.8*  PLT 233 265 335   Coag's  Recent Labs Lab 01/09/15 1510  APTT 25  INR 1.07     BMET  Recent Labs Lab 01/12/15 1030 01/13/15 0255 01/14/15 0456  NA 133* 131* 136  K 4.7 4.5 3.6  CL 99* 95* 96*  CO2 24 21* 21*  BUN 83* 105* 154*  CREATININE 3.00* 3.67* 4.89*  GLUCOSE 212* 182* 181*   Electrolytes  Recent Labs Lab 01/10/15 0527  01/11/15 0330  01/12/15 0440 01/12/15 1030 01/13/15 0255 01/14/15 0456  CALCIUM 8.3*  8.3*  < > 8.1*  < >  --  8.5* 8.4* 9.0  MG 3.5*  --  2.8*  --  2.9*  --   --   --   PHOS 6.0*  < > 3.8  < >  --  5.9* 7.8* 6.8*  < > = values in this interval not displayed.   Liver Enzymes  Recent Labs Lab 01/12/15 1030 01/13/15 0255 01/14/15 0456  ALBUMIN 2.4* 2.4* 2.7*   Cardiac Enzymes No results for input(s): TROPONINI, PROBNP in the last 168 hours. Glucose  Recent Labs Lab 01/13/15 1619 01/13/15 1948 01/13/15 2349 01/14/15 0413 01/14/15  0746 01/14/15 1104  GLUCAP 276* 279* 241* 177* 180* 143*    CXR: CM, LLL atx     ASSESSMENT / PLAN:  PULMONARY A:  Acute hypoxic/hypercapnic respiratory Probable OSA/OHS Tobacco abuse P:   Cont vent support - settings reviewed and/or adjusted Work in PSV mode as tolerated Cont vent bundle Daily SBT if/when meets criteria ENT consult 5/09  CARDIOVASCULAR A:  Hx of HTN and chronic diastolic heart failure Elevated troponin >> demand ischemia Hx of HLD VT episode 4/26 B/l lower extremity DVT P:  Continue ASA Cont UFH gtt until after trach tube placement  Then transition to warfarin  RENAL A:   AKI 2nd to ATN Scrotal mass - AFP and beta HCG normal P:    Monitor BMET intermittently Monitor I/Os Correct electrolytes as indicated HD planned today   GASTROINTESTINAL A:  High GRVs Constipation, resolved P:   SUP: enteral famotidine Resume TFs EES as motility agent started 5/09  HEMATOLOGIC A:   Polycythemia - likely chronic hypoxemia. Resolved P:  DVT px: heparin gtt Monitor CBC intermittently Transfuse per usual ICU guidelines   INFECTIOUS A:   HCAP >> completed abx 5/05 P:  Cont to monitor off Abx  ENDOCRINE A:   Hyperglycemia without prior documentation of DM P:   Lantus increased 5/09 Cont SSI  NEUROLOGIC A:  Acute encephalopathy P:   RASS goal -2, -3 Cont propofol Cont fentanyl  ORTHO A: Rt shoulder surgery 4/22. P: Per ortho  Summary:  Discussed with Dr Lazarus SalinesWolicki CC time 35 minutes.  Billy Fischeravid Edmund Holcomb, MD ; [facility] service Mobile [phone].  After 5:30 PM or weekends, call [phone]

## 2015-01-15 ENCOUNTER — Inpatient Hospital Stay (HOSPITAL_COMMUNITY): Payer: Worker's Compensation

## 2015-01-15 LAB — CBC
HEMATOCRIT: 36.7 % — AB (ref 39.0–52.0)
Hemoglobin: 11.9 g/dL — ABNORMAL LOW (ref 13.0–17.0)
MCH: 30.9 pg (ref 26.0–34.0)
MCHC: 32.4 g/dL (ref 30.0–36.0)
MCV: 95.3 fL (ref 78.0–100.0)
PLATELETS: 322 10*3/uL (ref 150–400)
RBC: 3.85 MIL/uL — ABNORMAL LOW (ref 4.22–5.81)
RDW: 16.8 % — ABNORMAL HIGH (ref 11.5–15.5)
WBC: 16.4 10*3/uL — AB (ref 4.0–10.5)

## 2015-01-15 LAB — GLUCOSE, CAPILLARY
GLUCOSE-CAPILLARY: 223 mg/dL — AB (ref 70–99)
GLUCOSE-CAPILLARY: 182 mg/dL — AB (ref 70–99)
Glucose-Capillary: 223 mg/dL — ABNORMAL HIGH (ref 70–99)
Glucose-Capillary: 233 mg/dL — ABNORMAL HIGH (ref 70–99)
Glucose-Capillary: 181 mg/dL — ABNORMAL HIGH (ref 70–99)
Glucose-Capillary: 178 mg/dL — ABNORMAL HIGH (ref 70–99)

## 2015-01-15 LAB — RENAL FUNCTION PANEL
ALBUMIN: 2.8 g/dL — AB (ref 3.5–5.0)
ANION GAP: 15 (ref 5–15)
BUN: 96 mg/dL — AB (ref 6–20)
CALCIUM: 8.3 mg/dL — AB (ref 8.9–10.3)
CO2: 22 mmol/L (ref 22–32)
Chloride: 97 mmol/L — ABNORMAL LOW (ref 101–111)
Creatinine, Ser: 4.09 mg/dL — ABNORMAL HIGH (ref 0.61–1.24)
GFR calc Af Amer: 17 mL/min — ABNORMAL LOW (ref >60)
GFR calc non Af Amer: 14 mL/min — ABNORMAL LOW (ref >60)
GLUCOSE: 218 mg/dL — AB (ref 70–99)
POTASSIUM: 4.5 mmol/L (ref 3.5–5.1)
Phosphorus: 6.7 mg/dL — ABNORMAL HIGH (ref 2.5–4.6)
Sodium: 134 mmol/L — ABNORMAL LOW (ref 135–145)

## 2015-01-15 LAB — HEPARIN LEVEL (UNFRACTIONATED)
HEPARIN UNFRACTIONATED: 0.24 [IU]/mL — AB (ref 0.30–0.70)
Heparin Unfractionated: 0.28 IU/mL — ABNORMAL LOW (ref 0.30–0.70)

## 2015-01-15 MED ORDER — HEPARIN (PORCINE) IN NACL 100-0.45 UNIT/ML-% IJ SOLN: 1850.0000 [IU]/h | INTRAMUSCULAR | Status: AC

## 2015-01-15 MED ORDER — HEPARIN SODIUM (PORCINE) 1000 UNIT/ML DIALYSIS: 20.0000 [IU]/kg | INTRAMUSCULAR | Status: DC | PRN

## 2015-01-15 MED ORDER — CHLORHEXIDINE GLUCONATE 4 % EX LIQD
1.0000 "application " | Freq: Once | CUTANEOUS | Status: AC
  Administered 2015-01-16: 1 via TOPICAL
  Filled 2015-01-15: qty 15

## 2015-01-15 MED ORDER — ERYTHROMYCIN ETHYLSUCCINATE 200 MG/5ML PO SUSR
400.0000 mg | Freq: Three times a day (TID) | ORAL | Status: AC
  Administered 2015-01-15 – 2015-01-16 (×2): 400 mg via ORAL
  Filled 2015-01-15 (×4): qty 10

## 2015-01-15 MED ORDER — DEXTROSE-NACL 5-0.9 % IV SOLN
INTRAVENOUS | Status: DC
  Administered 2015-01-15 – 2015-01-18 (×5): via INTRAVENOUS

## 2015-01-15 MED ORDER — CHLORHEXIDINE GLUCONATE 4 % EX LIQD
1.0000 "application " | Freq: Once | CUTANEOUS | Status: AC
  Administered 2015-01-15: 1 via TOPICAL
  Filled 2015-01-15: qty 15

## 2015-01-15 NOTE — Progress Notes (Signed)
ANTICOAGULATION CONSULT NOTE - Follow Up  Pharmacy Consult:  Heparin Indication:  New bilateral femoral DVTs  No Known Allergies  Patient Measurements: Height: 5\' 11"  (180.3 cm) Weight:  (348 lbs +rectal pouch 5 pillows shoulder brace urinary cath) IBW/kg (Calculated) : 75.3 Heparin Dosing Weight: 103 kg  Vital Signs: Temp: 99.4 F (37.4 C) (05/10 0745) Temp Source: Oral (05/10 0745) BP: 110/55 mmHg (05/10 0700) Pulse Rate: 92 (05/10 0713)  Labs:  Recent Labs  01/13/15 0255 01/13/15 0345 01/14/15 0456 01/14/15 0457 01/15/15 0454 01/15/15 0455  HGB 11.9*  --   --  12.8*  --  11.9*  HCT 35.8*  --   --  37.8*  --  36.7*  PLT 265  --   --  335  --  322  HEPARINUNFRC 1.70* 0.50  --  0.42 0.24*  --   CREATININE 3.67*  --  4.89*  --   --  4.09*    Estimated Creatinine Clearance: 29.3 mL/min (by C-G formula based on Cr of 4.09).    Assessment: 63 YOM continues on IV heparin for bilateral DVTs.  Heparin levels have been therapeutic and stable on 1600 units/hr, then became sub-therapeutic this AM and rate was adjusted.  Recheck heparin level remains sub-therapeutic.  No bleeding reported.   Goal of Therapy:  Heparin level 0.3-0.7 units/ml Monitor platelets by anticoagulation protocol: Yes   Plan:  - Increase heparin gtt to 1850 units/hr - Check 8 hr HL - Hold heparin gtt at MN for trach on 01/16/15 per CCM - Daily heparin level and CBC - F/u plan for oral anticoagulation post trach    Jhostin Epps D. Laney Potashang, PharmD, BCPS Pager:  [phone] 01/15/2015, 3:07 PM

## 2015-01-15 NOTE — Progress Notes (Signed)
eLink Physician-Brief Progress Note Patient Name: [name] DOB: 1950/11/06 MRN: [id number]   Date of Service  01/15/2015  HPI/Events of Note  Patient having enteral feeds held at midnight for procedure in AM. Currently on Lantus. Na+ = 134.  eICU Interventions  Will order: 1. Hold Lantus dose tonight.  2. Start D5 0.9 NaCl IV infusion to run at 100 mL/hour.      Intervention Category Minor Interventions: Routine modifications to care plan (e.g. PRN medications for pain, fever)  [name] Eugene 01/15/2015, 10:11 PM

## 2015-01-15 NOTE — Progress Notes (Signed)
01/15/2015 9:58 AM  Corriveau, Fayrene FearingJames [id number]    Temp:  [97.5 F (36.4 C)-99.4 F (37.4 C)] 99.4 F (37.4 C) (05/10 0745) Pulse Rate:  [71-93] 87 (05/10 0900) Resp:  [15-27] 15 (05/10 0900) BP: (81-128)/(39-70) 104/51 mmHg (05/10 0900) SpO2:  [88 %-98 %] 93 % (05/10 0900) FiO2 (%):  [40 %] 40 % (05/10 0900),     Intake/Output Summary (Last 24 hours) at 01/15/15 0958 Last data filed at 01/15/15 0900  Gross per 24 hour  Intake 2531.16 ml  Output   2520 ml  Net  11.16 ml    Results for orders placed or performed during the hospital encounter of 12/28/14 (from the past 24 hour(s))  Glucose, capillary     Status: Abnormal   Collection Time: 01/14/15 11:04 AM  Result Value Ref Range   Glucose-Capillary 143 (H) 70 - 99 mg/dL  Hepatitis B surface antigen     Status: None   Collection Time: 01/14/15  1:52 PM  Result Value Ref Range   Hepatitis B Surface Ag NEGATIVE NEGATIVE  Hepatitis B surface antibody     Status: None   Collection Time: 01/14/15  1:52 PM  Result Value Ref Range   Hep B S Ab NEGATIVE NEGATIVE  Glucose, capillary     Status: Abnormal   Collection Time: 01/14/15  4:25 PM  Result Value Ref Range   Glucose-Capillary 136 (H) 70 - 99 mg/dL  Glucose, capillary     Status: Abnormal   Collection Time: 01/14/15  8:43 PM  Result Value Ref Range   Glucose-Capillary 137 (H) 70 - 99 mg/dL  Glucose, capillary     Status: Abnormal   Collection Time: 01/14/15 11:53 PM  Result Value Ref Range   Glucose-Capillary 203 (H) 70 - 99 mg/dL  Glucose, capillary     Status: Abnormal   Collection Time: 01/15/15  3:15 AM  Result Value Ref Range   Glucose-Capillary 223 (H) 70 - 99 mg/dL  Heparin level (unfractionated)     Status: Abnormal   Collection Time: 01/15/15  4:54 AM  Result Value Ref Range   Heparin Unfractionated 0.24 (L) 0.30 - 0.70 IU/mL  CBC     Status: Abnormal   Collection Time: 01/15/15  4:55 AM  Result Value Ref Range   WBC 16.4 (H) 4.0 - 10.5 K/uL   RBC 3.85  (L) 4.22 - 5.81 MIL/uL   Hemoglobin 11.9 (L) 13.0 - 17.0 g/dL   HCT 40.936.7 (L) 81.139.0 - 91.452.0 %   MCV 95.3 78.0 - 100.0 fL   MCH 30.9 26.0 - 34.0 pg   MCHC 32.4 30.0 - 36.0 g/dL   RDW 78.216.8 (H) 95.611.5 - 21.315.5 %   Platelets 322 150 - 400 K/uL  Renal function panel     Status: Abnormal   Collection Time: 01/15/15  4:55 AM  Result Value Ref Range   Sodium 134 (L) 135 - 145 mmol/L   Potassium 4.5 3.5 - 5.1 mmol/L   Chloride 97 (L) 101 - 111 mmol/L   CO2 22 22 - 32 mmol/L   Glucose, Bld 218 (H) 70 - 99 mg/dL   BUN 96 (H) 6 - 20 mg/dL   Creatinine, Ser 0.864.09 (H) 0.61 - 1.24 mg/dL   Calcium 8.3 (L) 8.9 - 10.3 mg/dL   Phosphorus 6.7 (H) 2.5 - 4.6 mg/dL   Albumin 2.8 (L) 3.5 - 5.0 g/dL   GFR calc non Af Amer 14 (L) >60 mL/min   GFR calc Af Amer 17 (  L) >60 mL/min   Anion gap 15 5 - 15  Glucose, capillary     Status: Abnormal   Collection Time: 01/15/15  7:31 AM  Result Value Ref Range   Glucose-Capillary 223 (H) 70 - 99 mg/dL    SUBJECTIVE:  Overall slow improvement  OBJECTIVE:  Large head and trunk.  Neck relatively nl although hidden  IMPRESSION:  Prolonged intubation  PLAN:  For tracheostomy tomorrow.  Will obtain consent.  Orders written.  [name], Quinterious Walraven

## 2015-01-15 NOTE — Progress Notes (Signed)
Occupational Therapy Treatment Patient Details Name: Fayrene FearingJames Jasko MRN: [id number] DOB: March 17, 1951 Today's Date: 01/15/2015    History of present illness [facility] or [facility] Rt RCR.  He was intubated 4/23 am for post op hypercarbic resp failure.  US testes show masses - work up to be one by urology; 5/4 dopplers showed bil. LE DVTs; [facility].  PMH includes morbid obesity, arthritis    OT comments  PROM performed, no evidence of skin compromise.    Follow Up Recommendations  LTACH    Equipment Recommendations  Other (comment)    Recommendations for Other Services      Precautions / Restrictions Precautions Precautions: Fall;Shoulder;Other (comment) (ETT, CRRT, mulitple lines and tubes ) Type of Shoulder Precautions: Abduction sling in place at all ties.  PROM Rt elbow, wrist, and hand daily  Shoulder Interventions: Shoulder abduction pillow Precaution Comments: shoulder, abductor sling on RUE, except for PROM to elbow and wrist; then removed for this per MD Restrictions Weight Bearing Restrictions: Yes RUE Weight Bearing: Non weight bearing       Mobility Bed Mobility                  Transfers                      Balance                                   ADL Overall ADL's : Needs assistance/impaired                                       General ADL Comments: Hygiene performed Rt UE.  No evidence of skin compromise       Vision                     Perception     Praxis      Cognition   Behavior During Therapy: Flat affect Overall Cognitive Status: Difficult to assess                       Extremity/Trunk Assessment               Exercises General Exercises - Upper Extremity Elbow Flexion: PROM;Right;Supine;15 reps Elbow Extension: PROM;Right;Supine;15 reps Wrist Flexion: PROM;Right;Supine;15 reps Wrist Extension: PROM;Right;Supine;15  reps Digit Composite Flexion: PROM;Right;Supine;15 reps Composite Extension: PROM;Right;Supine;15 reps   Shoulder Instructions       General Comments      Pertinent Vitals/ Pain       Pain Assessment: Faces Faces Pain Scale: No hurt (Pt sedated )  Home Living                                          Prior Functioning/Environment              Frequency Min 6X/week     Progress Toward Goals  OT Goals([place] goals can now be found in the care plan section)  Progress towards OT goals: Progressing toward goals  ADL Goals Additional ADL Goal #1: pt will tolerate sling without skin compromise, as noted during PROM to elbow and wrist and RUE  ADLs  Plan Discharge plan needs to be updated    Co-evaluation                 End of Session Equipment Utilized During Treatment: Other (comment) (abduction sling )   Activity Tolerance Patient tolerated treatment well   Patient Left in bed;with nursing/sitter in room   Nurse Communication          Time: [id number] OT Time Calculation (min): 15 min  Charges: OT General Charges $OT Visit: 1 Procedure OT Treatments $Therapeutic Exercise: 8-22 mins  Coraline Talwar M 01/15/2015, 12:03 PM

## 2015-01-15 NOTE — Progress Notes (Signed)
ANTICOAGULATION CONSULT NOTE - Follow Up Consult  Pharmacy Consult for heparin Indication: DVT   Labs:  Recent Labs  01/13/15 0255 01/13/15 0345 01/14/15 0456 01/14/15 0457 01/15/15 0454 01/15/15 0455  HGB 11.9*  --   --  12.8*  --  11.9*  HCT 35.8*  --   --  37.8*  --  36.7*  PLT 265  --   --  335  --  322  HEPARINUNFRC 1.70* 0.50  --  0.42 0.24*  --   CREATININE 3.67*  --  4.89*  --   --  4.09*      Assessment: 64yo male now subtherapeutic on heparin after stable levels at [place] rate.  Goal of Therapy:  Heparin level 0.3-0.7 units/ml   Plan:  Will increase heparin gtt by slightly to 1700 units/hr and check level in 8hr.  Vernard GamblesVeronda Charley Lafrance, PharmD, BCPS  01/15/2015,6:27 AM

## 2015-01-15 NOTE — Progress Notes (Signed)
Inpatient Diabetes Program Recommendations  AACE/ADA: New Consensus Statement on Inpatient Glycemic Control (2013)  Target Ranges:  Prepandial:   less than 140 mg/dL      Peak postprandial:   less than 180 mg/dL (1-2 hours)      Critically ill patients:  140 - 180 mg/dL   Reason for Assessment:  Results for [name], Jaylenn (MRN [id number]) as of 01/15/2015 09:24  Ref. Range 01/14/2015 16:25 01/14/2015 20:43 01/14/2015 23:53 01/15/2015 03:15 01/15/2015 07:31  Glucose-Capillary Latest Ref Range: 70-99 mg/dL 563136 (H) 875137 (H) 643203 (H) 223 (H) 223 (H)    Note CBG's increased with tube feeds.  May consider adding Novolog 2 units q 4 hours to assist in covering CHO of tube feeds.  Would recommend this instead of increasing Lantus.  Thanks,  Beryl MeagerJenny Mylik Pro, RN, BC-ADM Inpatient Diabetes Coordinator Pager [phone]

## 2015-01-15 NOTE — Progress Notes (Signed)
[facility] Progress Note   Subjective:  Events noted-remains on vent- had HD late yest with some hypotension - removed 2 liters - UOP marginal   Filed Vitals:   01/15/15 0500 01/15/15 0530 01/15/15 0600 01/15/15 0630  BP: 114/65 123/67 114/64 107/57  Pulse: 86 88 88 85  Temp:      TempSrc:      Resp: 20 23 18 17   Height:      Weight:      SpO2: 97% 96% 95% 95%   Exam: On vent, eyes closed- sedated  No jvd Chest clear ant and lat RRR no rub or gallop Abd very obese, ND NT +BS x 4 Temp L groin HD cath in place GU foley in place draining dark brown urine Ext mild-moderate diffuse edema of LE's and UE's Neuro is sedated on the vent  Renal US 11-13 cm kidneys , no hydro UA 5.0, 1.020, prot 30, rbc 21-50/hpf, wbc 0-2/hpf UNa 30, UCreat 230 CXR 5/8 no chg mild vasc congestion, chronic IS pattern  Summary: 64 yo w DJD/ obesity underwent elective R rotator cuff repair surgery on 4/22. Normal creatinine at baseline. Postop had hypercarbic resp failure/ HCAP and septic shock rx w vasopressors and IV vanc/cefipime. Developed AKI 4/30 while still on pressors and IV abx. Vanc level on 5/1 elevated at 43 ug/mL. RRT started on 5/4 w CRRT from 5/4 - 5/7.   Assessment: 1. AKI - ATN due to shock/ vanc toxicity. S/P CRRT 5/4-5/7. Then an IHD treatment 5/9. Was making better urine, but now dropped off.  Will hold off of HD today but likely will need tomorrow via vascath left femoral placed 5/4 2. Vol excess moderate diffuse edema- volume removal difficult due to low BP's- removed 2 liters yest 3. Shock / fevers - all cx's negative, s/p 2 wks abx and off pressors. BP's soft but not sure reliable due to size.  4. S/P R rotator cuff repair 4/22 5. Morbid obesity 6. VDRF- needed to be reintubated - now being evals for trach 7. Bilat LE DVT / suspected PE - on IV hep 8. Hyperphos- no treatment yet 9. Hypokalemia- stable right now 10. Anemia- previous polycythremia- hgb now 12        Joyelle Siedlecki A   01/15/2015, 7:20 AM     Recent Labs Lab 01/13/15 0255 01/14/15 0456 01/15/15 0455  NA 131* 136 134*  K 4.5 3.6 4.5  CL 95* 96* 97*  CO2 21* 21* 22  GLUCOSE 182* 181* 218*  BUN 105* 154* 96*  CREATININE 3.67* 4.89* 4.09*  CALCIUM 8.4* 9.0 8.3*  PHOS 7.8* 6.8* 6.7*    Recent Labs Lab 01/13/15 0255 01/14/15 0456 01/15/15 0455  ALBUMIN 2.4* 2.7* 2.8*    Recent Labs Lab 01/13/15 0255 01/14/15 0457 01/15/15 0455  WBC 13.5* 16.1* 16.4*  HGB 11.9* 12.8* 11.9*  HCT 35.8* 37.8* 36.7*  MCV 95.0 92.2 95.3  PLT 265 335 322   . alteplase  2 mg Intracatheter Once  . antiseptic oral rinse  7 mL Mouth Rinse QID  . aspirin  81 mg Per Tube Daily  . chlorhexidine  15 mL Mouth Rinse BID  . erythromycin  400 mg Per Tube TID  . famotidine  20 mg Per Tube Daily  . feeding supplement (PRO-STAT SUGAR FREE 64)  60 mL Per Tube QID  . feeding supplement (VITAL HIGH PROTEIN)  1,000 mL Per Tube Q24H  . folic acid  1 mg Per Tube Daily  .  insulin aspart  0-20 Units Subcutaneous 6 times per day  . insulin glargine  15 Units Subcutaneous BID  . sodium chloride  10-40 mL Intracatheter Q12H   . sodium chloride 10 mL/hr at 01/13/15 0828  . fentaNYL infusion INTRAVENOUS 100 mcg/hr (01/15/15 0302)  . heparin 1,700 Units/hr (01/15/15 [id number])  . propofol (DIPRIVAN) infusion 15 mcg/kg/min (01/15/15 0410)   sodium chloride, sodium chloride, acetaminophen (TYLENOL) oral liquid 160 mg/5 mL, feeding supplement (NEPRO CARB STEADY), fentaNYL, heparin, heparin, heparin, hydrALAZINE, lidocaine (PF), lidocaine-prilocaine, lip balm, midazolam, [DISCONTINUED] ondansetron **OR** ondansetron (ZOFRAN) IV, pentafluoroprop-tetrafluoroeth, sodium chloride

## 2015-01-15 NOTE — Progress Notes (Signed)
PULMONARY / CRITICAL CARE MEDICINE   Name: [name] MRN: [id number] DOB: [birth year]    ADMISSION DATE:  12/28/2014 CONSULTATION DATE:  12/28/14   REFERRING MD :  Imogene BurnJenkins/TRH  PT PROFILE:  89 morbidly obese male admitted 4/22 by Ortho for elective R rotator cuff tear. Intubated early 4/23 AM for post operative hypercarbic/hypoxic resp failure.  MAJOR EVENTS/TEST RESULTS: 4/22  elective R shoulder surgery (Dr Thomasena Edisollins) [facility] 4/23  reintubated for hypercarbic/hypoxic resp failure 4/23  US scrotum: Two solid extratesticular right scrotal masses. Primary differential diagnosis for extratesticular masses include primary or metastatic neoplasms including adenomatoid tumor, fibroma, leiomyoma, mesothelioma, or sarcoma. Urology consultation is recommended  4/26 TEE:  LVEF 60-65%, Grade 1 DD, trivial pericardial effusion 4/27 US testes with masses 5/02 renal US:  no hydronephrosis, Possible nonobstructing stone lower pole left kidney 5/02 initial ENT consult. Trach tube planned but pt deemed too critically ill @ the time to proceed 5/03 Renal consult for AKI 5/04 LE venous duplex:  B DVT present 5/04 [facility] 5/06 Off pressors. Transitioned off CRRT to intermittent HD 5/08 Failed extubation attempt 5/09 ENT re- consult for trach tube placement 5/10 Tolerated PS 10 cm H2O. Seen by ENT. Trach tube placement planned 5/11  DEVICES/LINES/TUBES: ETT 4/23 >> 5/08, 5/08 >>  RUE PICC 4/23 >>  L femoral HD cath 5/04 >>   MICRO: MRSA PCR 4/22 >> NEG Urine 4.23 >> NEG resp 4/23 >> NEG Urine 4/23 >> NEG Resp 4/27 >> NEG Blood 4/27 >> NEG Resp 5/02 >> NEG  ANTIBIOTICS: Cefazolin 4/22 >> 4/23 Levofloxacin 4/23 >> 4/27 Cefepime 4/27 >> 5/05 Vanc 4/27 >> 5/01   SUBJECTIVE:   Remains on sedation. RASS -3. Not F/C  VITAL SIGNS: Temp:  [97.7 F (36.5 C)-99.4 F (37.4 C)] 99.1 F (37.3 C) (05/10 1549) Pulse Rate:  [78-93] 87 (05/10  1553) Resp:  [14-27] 14 (05/10 1553) BP: (81-128)/(43-70) 104/51 mmHg (05/10 0900) SpO2:  [88 %-98 %] 95 % (05/10 1553) FiO2 (%):  [40 %] 40 % (05/10 1553)   VENTILATOR SETTINGS: Vent Mode:  [-] PSV;CPAP FiO2 (%):  [40 %] 40 % Set Rate:  [20 bmp] 20 bmp Vt Set:  [600 mL] 600 mL PEEP:  [5 cmH20] 5 cmH20 Pressure Support:  [10 cmH20] 10 cmH20 Plateau Pressure:  [12 cmH20-22 cmH20] 18 cmH20   INTAKE / OUTPUT:  Intake/Output Summary (Last 24 hours) at 01/15/15 1611 Last data filed at 01/15/15 0900  Gross per 24 hour  Intake 1803.96 ml  Output   2340 ml  Net -536.04 ml    PHYSICAL EXAMINATION: General: obese, RASS -3, not F/C Neuro: no focal deficits HEENT: NCAT, thick, short neck Cardiovascular: regular, no M Lungs: Clear anteriorly, no wheeze Abdomen: soft, non tender Ext: R shoulder in sling, 1+ symmetric edema  LABS:  CBC  Recent Labs Lab 01/13/15 0255 01/14/15 0457 01/15/15 0455  WBC 13.5* 16.1* 16.4*  HGB 11.9* 12.8* 11.9*  HCT 35.8* 37.8* 36.7*  PLT 265 335 322   Coag's  Recent Labs Lab 01/09/15 1510  APTT 25  INR 1.07     BMET  Recent Labs Lab 01/13/15 0255 01/14/15 0456 01/15/15 0455  NA 131* 136 134*  K 4.5 3.6 4.5  CL 95* 96* 97*  CO2 21* 21* 22  BUN 105* 154* 96*  CREATININE 3.67* 4.89* 4.09*  GLUCOSE 182* 181* 218*   Electrolytes  Recent Labs Lab 01/10/15 0527  01/11/15 0330  01/12/15 0440  01/13/15 0255 01/14/15 0456 01/15/15 0455  CALCIUM 8.3*  8.3*  < > 8.1*  < >  --   < > 8.4* 9.0 8.3*  MG 3.5*  --  2.8*  --  2.9*  --   --   --   --   PHOS 6.0*  < > 3.8  < >  --   < > 7.8* 6.8* 6.7*  < > = values in this interval not displayed.   Liver Enzymes  Recent Labs Lab 01/13/15 0255 01/14/15 0456 01/15/15 0455  ALBUMIN 2.4* 2.7* 2.8*   Cardiac Enzymes No results for input(s): TROPONINI, PROBNP in the last 168 hours. Glucose  Recent Labs Lab 01/14/15 2043 01/14/15 2353 01/15/15 0315 01/15/15 0731  01/15/15 1130 01/15/15 1507  GLUCAP 137* 203* 223* 223* 233* 181*    CXR: NSC CM, LLL atx    ASSESSMENT / PLAN:  PULMONARY A:  Acute hypoxic/hypercapnic respiratory Probable OSA/OHS Tobacco abuse P:   Cont vent support - settings reviewed and/or adjusted Work in PSV mode as tolerated Cont vent bundle Daily SBT if/when meets criteria Trach tube planned 5/11  CARDIOVASCULAR A:  Hx of HTN and chronic diastolic heart failure Minimally elevated troponin (0.48 on 5/01) - suspected demand ischemia Hx of HLD VT episode 4/26 B/l lower extremity DVT P:  Continue ASA Cont UFH gtt until after trach tube placement  Then transition to warfarin  RENAL A:   AKI 2nd to ATN Scrotal mass - AFP and beta HCG normal P:   Monitor BMET intermittently Monitor I/Os Correct electrolytes as indicated HD per Renal Discussed with Dr Kathrene BongoGoldsborough - permacath to be ordered per IR  GASTROINTESTINAL A:  High GRVs - resolved Constipation, resolved P:   SUP: enteral famotidine Cont TFs Cont EES as motility agent (started 5/09)  HEMATOLOGIC A:   Polycythemia - likely chronic hypoxemia. Resolved P:  DVT px: heparin gtt Monitor CBC intermittently Transfuse per usual ICU guidelines Transition to warfarin after trach tube and Permacath placed  INFECTIOUS A:   Possible HCAP, treated P:  Cont to monitor off Abx  ENDOCRINE A:   Hyperglycemia without prior documentation of DM P:   Cont Lantus - increased 5/09 Cont SSI  NEUROLOGIC A:  Acute encephalopathy Post op pain ICU associated discomfort/agitation Severe deconditioning P:   RASS goal -2, -3 Cont propofol - transition off after trach Cont fentanyl - transition off after trach Will need PT and rehab vs LTACH  ORTHO A: Rt shoulder surgery 4/22. P: Post op mgmt per ortho     Discussed with Dr Kathrene BongoGoldsborough Discussed with Dr Lazarus SalinesWolicki  CC time 40 minutes.  Billy Fischeravid Simonds, MD ; [facility] service Mobile [phone].   After 5:30 PM or weekends, call [phone]

## 2015-01-16 ENCOUNTER — Inpatient Hospital Stay (HOSPITAL_COMMUNITY): Payer: Worker's Compensation | Admitting: Anesthesiology

## 2015-01-16 ENCOUNTER — Encounter (HOSPITAL_COMMUNITY)
Admission: RE | Disposition: A | Discharge status: Nursing Home (Includes ICF) | Payer: Self-pay | Source: Ambulatory Visit | Attending: Pulmonary Disease

## 2015-01-16 ENCOUNTER — Encounter (HOSPITAL_COMMUNITY): Payer: Self-pay | Admitting: Radiology

## 2015-01-16 ENCOUNTER — Inpatient Hospital Stay (HOSPITAL_COMMUNITY): Payer: Worker's Compensation

## 2015-01-16 HISTORY — PX: TRACHEOSTOMY TUBE PLACEMENT: SHX814

## 2015-01-16 LAB — GLUCOSE, CAPILLARY
GLUCOSE-CAPILLARY: 231 mg/dL — AB (ref 70–99)
GLUCOSE-CAPILLARY: 136 mg/dL — AB (ref 70–99)
GLUCOSE-CAPILLARY: 124 mg/dL — AB (ref 70–99)
GLUCOSE-CAPILLARY: 117 mg/dL — AB (ref 70–99)
Glucose-Capillary: 164 mg/dL — ABNORMAL HIGH (ref 70–99)
Glucose-Capillary: 136 mg/dL — ABNORMAL HIGH (ref 70–99)

## 2015-01-16 LAB — CBC
HCT: 36.1 % — ABNORMAL LOW (ref 39.0–52.0)
HEMOGLOBIN: 11.6 g/dL — AB (ref 13.0–17.0)
MCH: 30.5 pg (ref 26.0–34.0)
MCHC: 32.1 g/dL (ref 30.0–36.0)
MCV: 95 fL (ref 78.0–100.0)
Platelets: 334 10*3/uL (ref 150–400)
RBC: 3.8 MIL/uL — ABNORMAL LOW (ref 4.22–5.81)
RDW: 16.9 % — ABNORMAL HIGH (ref 11.5–15.5)
WBC: 14.8 10*3/uL — AB (ref 4.0–10.5)

## 2015-01-16 LAB — RENAL FUNCTION PANEL
ANION GAP: 15 (ref 5–15)
Albumin: 2.5 g/dL — ABNORMAL LOW (ref 3.5–5.0)
BUN: 144 mg/dL — ABNORMAL HIGH (ref 6–20)
CALCIUM: 8.9 mg/dL (ref 8.9–10.3)
CHLORIDE: 99 mmol/L — AB (ref 101–111)
CO2: 23 mmol/L (ref 22–32)
Creatinine, Ser: 5.23 mg/dL — ABNORMAL HIGH (ref 0.61–1.24)
GFR calc non Af Amer: 11 mL/min — ABNORMAL LOW (ref >60)
GFR, EST AFRICAN AMERICAN: 12 mL/min — AB (ref >60)
GLUCOSE: 226 mg/dL — AB (ref 70–99)
Phosphorus: 10.6 mg/dL — ABNORMAL HIGH (ref 2.5–4.6)
Potassium: 5.1 mmol/L (ref 3.5–5.1)
Sodium: 137 mmol/L (ref 135–145)

## 2015-01-16 LAB — HEPARIN LEVEL (UNFRACTIONATED): HEPARIN UNFRACTIONATED: 0.25 [IU]/mL — AB (ref 0.30–0.70)

## 2015-01-16 LAB — TRIGLYCERIDES: Triglycerides: 292 mg/dL — ABNORMAL HIGH (ref <150)

## 2015-01-16 SURGERY — CREATION, TRACHEOSTOMY
Anesthesia: General

## 2015-01-16 MED ORDER — 0.9 % SODIUM CHLORIDE (POUR BTL) OPTIME
TOPICAL | Status: DC | PRN
  Administered 2015-01-16: 1000 mL

## 2015-01-16 MED ORDER — ROCURONIUM BROMIDE 50 MG/5ML IV SOLN
INTRAVENOUS | Status: AC
  Filled 2015-01-16: qty 1

## 2015-01-16 MED ORDER — HEPARIN (PORCINE) IN NACL 100-0.45 UNIT/ML-% IJ SOLN
1950.0000 [IU]/h | INTRAMUSCULAR | Status: DC
  Administered 2015-01-16: 1950 [IU]/h via INTRAVENOUS
  Administered 2015-01-17: 2150 [IU]/h via INTRAVENOUS
  Filled 2015-01-16 (×5): qty 250

## 2015-01-16 MED ORDER — LIDOCAINE-EPINEPHRINE 1 %-1:100000 IJ SOLN
INTRAMUSCULAR | Status: AC
  Filled 2015-01-16: qty 1

## 2015-01-16 MED ORDER — LIDOCAINE HCL (CARDIAC) 20 MG/ML IV SOLN
INTRAVENOUS | Status: AC
  Filled 2015-01-16: qty 5

## 2015-01-16 MED ORDER — ALBUMIN HUMAN 25 % IV SOLN
25.0000 g | INTRAVENOUS | Status: AC
  Administered 2015-01-16: 25 g via INTRAVENOUS
  Filled 2015-01-16: qty 100

## 2015-01-16 MED ORDER — ROCURONIUM BROMIDE 100 MG/10ML IV SOLN
INTRAVENOUS | Status: DC | PRN
  Administered 2015-01-16 (×2): 50 mg via INTRAVENOUS

## 2015-01-16 MED ORDER — LACTATED RINGERS IV SOLN
INTRAVENOUS | Status: DC | PRN
  Administered 2015-01-16 (×2): via INTRAVENOUS

## 2015-01-16 MED ORDER — PROPOFOL 10 MG/ML IV BOLUS
INTRAVENOUS | Status: AC
  Filled 2015-01-16: qty 20

## 2015-01-16 MED ORDER — FENTANYL CITRATE (PF) 250 MCG/5ML IJ SOLN
INTRAMUSCULAR | Status: AC
  Filled 2015-01-16: qty 5

## 2015-01-16 MED ORDER — LIDOCAINE-EPINEPHRINE 1 %-1:100000 IJ SOLN
INTRAMUSCULAR | Status: DC | PRN
  Administered 2015-01-16: 10 mL

## 2015-01-16 SURGICAL SUPPLY — 29 items
BLADE SURG 15 STRL LF DISP TIS (BLADE) IMPLANT
BLADE SURG 15 STRL SS (BLADE)
BLADE SURG ROTATE 9660 (MISCELLANEOUS) IMPLANT
CANISTER SUCTION 2500CC (MISCELLANEOUS) ×3 IMPLANT
CLEANER TIP ELECTROSURG 2X2 (MISCELLANEOUS) ×3 IMPLANT
COVER SURGICAL LIGHT HANDLE (MISCELLANEOUS) ×3 IMPLANT
DECANTER SPIKE VIAL GLASS SM (MISCELLANEOUS) ×3 IMPLANT
DRAPE PROXIMA HALF (DRAPES) ×3 IMPLANT
ELECT COATED BLADE 2.86 ST (ELECTRODE) ×3 IMPLANT
ELECT REM PT RETURN 9FT ADLT (ELECTROSURGICAL) ×3
ELECTRODE REM PT RTRN 9FT ADLT (ELECTROSURGICAL) ×1 IMPLANT
GLOVE ECLIPSE 8.0 STRL XLNG CF (GLOVE) ×6 IMPLANT
GOWN STRL REUS W/ TWL LRG LVL3 (GOWN DISPOSABLE) ×1 IMPLANT
GOWN STRL REUS W/ TWL XL LVL3 (GOWN DISPOSABLE) ×1 IMPLANT
GOWN STRL REUS W/TWL LRG LVL3 (GOWN DISPOSABLE) ×2
GOWN STRL REUS W/TWL XL LVL3 (GOWN DISPOSABLE) ×2
KIT BASIN OR (CUSTOM PROCEDURE TRAY) ×3 IMPLANT
KIT ROOM TURNOVER OR (KITS) ×3 IMPLANT
NEEDLE HYPO 25GX1X1/2 BEV (NEEDLE) IMPLANT
NS IRRIG 1000ML POUR BTL (IV SOLUTION) ×3 IMPLANT
PAD ARMBOARD 7.5X6 YLW CONV (MISCELLANEOUS) ×6 IMPLANT
PENCIL BUTTON HOLSTER BLD 10FT (ELECTRODE) ×3 IMPLANT
SPONGE DRAIN TRACH 4X4 STRL 2S (GAUZE/BANDAGES/DRESSINGS) ×3 IMPLANT
SUT CHROMIC 2 0 SH (SUTURE) ×3 IMPLANT
SUT ETHILON 2 0 FS 18 (SUTURE) IMPLANT
SUT SILK 2 0 SH CR/8 (SUTURE) ×3 IMPLANT
TOWEL OR 17X24 6PK STRL BLUE (TOWEL DISPOSABLE) ×3 IMPLANT
TRAY ENT MC OR (CUSTOM PROCEDURE TRAY) ×3 IMPLANT
TUBE TRACH SHILEY 8 DIST CUF (TUBING) ×3 IMPLANT

## 2015-01-16 NOTE — Transfer of Care (Signed)
Immediate Anesthesia Transfer of Care Note  Patient: [name]  Procedure(s) Performed: Procedure(s): TRACHEOSTOMY (N/A)  Patient Location: ICU  Anesthesia Type:General  Level of Consciousness: Patient remains intubated per anesthesia plan  Airway & Oxygen Therapy: Patient placed on Ventilator (see vital sign flow sheet for setting)  Post-op Assessment: Post -op Vital signs reviewed and stable  Post vital signs: Reviewed and stable  Last Vitals:  Filed Vitals:   01/16/15 1645  BP: 112/59  Pulse: 85  Temp:   Resp: 18    Complications: No apparent anesthesia complications

## 2015-01-16 NOTE — Op Note (Signed)
01/16/2015 4:15 PM  [place],  Fayrene FearingJames [id number]  Pre-Op Dx: prolonged intubation  Post-Op Dx:  same  Proc:  Tracheostomy  Surg:  [name], Sherisse Fullilove  Anes:  GOT  EBL:  min  Comp:   none  Findings:  Fatty lower neck.  Small thyroid isthmus.  Moderately small tracheal caliber.  Procedure:  The patient was brought from the intensive care unit to the operating room .  Because of his large size, it was elected to do his procedure in the bed.   Anesthesia was administered per indwelling orotracheal tube.  The patient was placed in a slight reverse Trendelenburg.  Neck extension was achieved as possible.  The lower neck was palpated with the findings as described above.  1% Xylocaine with 1:100,000 epinephrine, 10 cc's, was infiltrated into the surgical field for intraoperative hemostasis.  Several minutes were allowed for this to take effect.  A Betadine sterile preparation  of the lower neck and upper chest was performed in the standard fashion.  Sterile draping was accomplished in the standard fashion.  A  5  cm transverse incision was made sharply approximately halfway between the sternal notch and cricoid cartilage and extended through skin and subcutaneous fat.  Using cautery, the superficial layer of the deep cervical fascia was lysed.  Additional dissection revealed the strap muscles.  The midline raphe was divided in two layers and the muscles retracted laterally.  The pretracheal plane was visualized.  This was entered bluntly.  The thyroid isthmus was isolated between hemostats, divided, and controlled with 2-0 silk suture ligatures.  The thyroid gland was retracted to either side.  The anterior face of the trachea was cleared.  In the  1-2 interspace, a transverse incision was made between cartilage rings into the tracheal lumen.  Cut mucosal edges were cauterized.  A 2-0 chromic was placed around the lower cartilage ring and secured to the lower wound.    A previously tested  # 8 Shiley cuffed  tracheostomy tube was brought into the field.  With the endotracheal tube under direct visualization through the tracheostomy, it was gently backed up.  The tracheostomy tube was inserted into the tracheal lumen.  Hemostasis was observed. The cuff was inflated and observed to be intact and containing pressure. The inner cannula was placed and ventilation assumed per tracheostomy tube.  Good chest wall motion was observed, and CO2 was documented per anesthesia.  The trach tube was secured in the standard fashion with cotton twill ties, and also 2-0 Nylon stitches.  Hemostasis was observed again.  When satisfactory ventilation was assured, the orotracheal tube was removed. The flexible laryngoscope was inserted through the tracheostomy tube into the trachea and good position was ascertained.   At this point the procedure was completed.  The patient was returned to anesthesia, awakened as possible, and transferred back to the intensive care unit in stable condition.  Comment: 64 y.o. wm with prolonged ventilation was the indication for today's procedure.  Anticipate a routine postoperative recovery including standard tracheal hygiene.  We will change the trach ties at four days but not use Velcro ties until seven days.  When the patient no longer requires ventilator or pressure support, the cuff should be deflated.  Change to an uncuffed tube and downsizing will be according to the clinical condition of the patient.

## 2015-01-16 NOTE — Progress Notes (Signed)
Referring Physician(s): Dr. Kathrene BongoGoldsborough   Subjective: Pt with resp failure and renal failure following orthopedic surgery. Currently intubated on vent, having tracheostomy today. Has been on CRRT via (L)femoral trialysis cathater. Needs tunneled HD catheter for expected prolonged dialysis needs. Chart, meds, labs, imaging, reviewed.  Past Medical History  Diagnosis Date  . Arthritis   . RCT (rotator cuff tear)   . Multiple abrasions     rt arm  . Morbid obesity    Past Surgical History  Procedure Laterality Date  . No past surgeries    . Shoulder arthroscopy with subacromial decompression, rotator cuff repair and bicep tendon repair Right 12/28/2014    Procedure: RIGHT SHOULDER ARTHROSCOPY WITH SUBACROMIAL DECOMPRESSION, DISTAL CLAVICLE RESECTION, ROTATOR CUFF REPAIR ;  Surgeon: Eugenia Mcalpineobert Collins, MD;  Location: [facility];  Service: Orthopedics;  Laterality: Right;   History   Social History  . Marital Status: Single    Spouse Name: N/A  . Number of Children: N/A  . Years of Education: N/A   Occupational History  . Not on file.   Social History Main Topics  . Smoking status: [place] Every Day Smoker  . Smokeless tobacco: Not on file  . Alcohol Use: No  . Drug Use: No  . Sexual Activity: Not on file   Other Topics Concern  . Not on file   Social History Narrative     Allergies: Review of patient's allergies indicates no known allergies.  Medications:  [facility] facility-administered medications:  .  0.9 %  sodium chloride infusion, , Intravenous, Continuous, Ron ParkerHarvette C Jenkins, MD, Stopped at 01/15/15 2336 .  acetaminophen (TYLENOL) solution 650 mg, 650 mg, Per Tube, Q6H PRN, Merwyn Katosavid B Simonds, MD .  alteplase (CATHFLO ACTIVASE) injection 2 mg, 2 mg, Intracatheter, Once, Lupita Leashouglas B McQuaid, MD, 2 mg at 01/06/15 0900 .  antiseptic oral rinse (CPC / CETYLPYRIDINIUM CHLORIDE 0.05%) solution 7 mL, 7 mL, Mouth Rinse, QID, Vineet Sood, MD, 7 mL at 01/16/15 0400 .  aspirin  chewable tablet 81 mg, 81 mg, Per Tube, Daily, Merwyn Katosavid B Simonds, MD, 81 mg at 01/15/15 1020 .  chlorhexidine (PERIDEX) 0.12 % solution 15 mL, 15 mL, Mouth Rinse, BID, Coralyn HellingVineet Sood, MD, 15 mL at 01/16/15 0757 .  dextrose 5 %-0.9 % sodium chloride infusion, , Intravenous, Continuous, Merwyn Katosavid B Simonds, MD, Last Rate: 50 mL/hr at 01/16/15 1300 .  erythromycin ethylsuccinate (EES) 200 MG/5ML suspension 400 mg, 400 mg, Oral, TID, Lupita Leashouglas B McQuaid, MD, 400 mg at 01/15/15 2258 .  famotidine (PEPCID) 40 MG/5ML suspension 20 mg, 20 mg, Per Tube, Daily, Merwyn Katosavid B Simonds, MD, 20 mg at 01/15/15 1020 .  feeding supplement (PRO-STAT SUGAR FREE 64) liquid 60 mL, 60 mL, Per Tube, QID, Idell PicklesKimberly A Harris, RD, 60 mL at 01/15/15 2258 .  feeding supplement (VITAL HIGH PROTEIN) liquid 1,000 mL, 1,000 mL, Per Tube, Q24H, Idell PicklesKimberly A Harris, RD, Stopped at 01/15/15 2337 .  fentaNYL (SUBLIMAZE) 2,500 mcg in sodium chloride 0.9 % 250 mL (10 mcg/mL) infusion, 25-400 mcg/hr, Intravenous, Continuous, Coralyn HellingVineet Sood, MD, Last Rate: 10 mL/hr at 01/16/15 0833, 100 mcg/hr at 01/16/15 0833 .  fentaNYL (SUBLIMAZE) bolus via infusion 50 mcg, 50 mcg, Intravenous, Q1H PRN, Coralyn HellingVineet Sood, MD .  folic acid (FOLVITE) tablet 1 mg, 1 mg, Per Tube, Daily, Merwyn Katosavid B Simonds, MD, 1 mg at 01/15/15 1020 .  heparin injection 1,000 Units, 1,000 Units, Dialysis, PRN, Annie SableKellie Goldsborough, MD .  heparin injection 1,000-6,000 Units, 1,000-6,000 Units, Intracatheter, PRN, Lupita Leashouglas B McQuaid,  MD, 3,000 Units at 01/12/15 0556 .  heparin injection 3,200 Units, 20 Units/kg, Dialysis, PRN, Annie SableKellie Goldsborough, MD .  heparin injection 3,200 Units, 20 Units/kg, Dialysis, PRN, Annie SableKellie Goldsborough, MD .  hydrALAZINE (APRESOLINE) injection 10-40 mg, 10-40 mg, Intravenous, Q4H PRN, Merwyn Katosavid B Simonds, MD, 10 mg at 01/04/15 0206 .  insulin aspart (novoLOG) injection 0-20 Units, 0-20 Units, Subcutaneous, 6 times per day, Merwyn Katosavid B Simonds, MD, 3 Units at 01/16/15 1230 .  insulin  glargine (LANTUS) injection 15 Units, 15 Units, Subcutaneous, BID, Merwyn Katosavid B Simonds, MD, Stopped at 01/15/15 2200 .  lidocaine (PF) (XYLOCAINE) 1 % injection 5 mL, 5 mL, Intradermal, PRN, Annie SableKellie Goldsborough, MD .  lidocaine-prilocaine (EMLA) cream 1 application, 1 application, Topical, PRN, Annie SableKellie Goldsborough, MD .  lip balm (CARMEX) ointment, , Topical, PRN, Cyril Mourningakesh Alva V, MD .  midazolam (VERSED) injection 2 mg, 2 mg, Intravenous, Q2H PRN, Coralyn HellingVineet Sood, MD .  [DISCONTINUED] ondansetron (ZOFRAN) tablet 4 mg, 4 mg, Oral, Q6H PRN **OR** ondansetron (ZOFRAN) injection 4 mg, 4 mg, Intravenous, Q6H PRN, Bryson L Stilwell, PA-C .  propofol (DIPRIVAN) 1000 MG/100ML infusion, 0-50 mcg/kg/min, Intravenous, Continuous, Coralyn HellingVineet Sood, MD, Last Rate: 4.8 mL/hr at 01/16/15 0303, 5 mcg/kg/min at 01/16/15 0303 .  sodium chloride 0.9 % injection 10-40 mL, 10-40 mL, Intracatheter, Q12H, Eugenia Mcalpineobert Collins, MD, 10 mL at 01/15/15 1952 .  sodium chloride 0.9 % injection 10-40 mL, 10-40 mL, Intracatheter, PRN, Eugenia Mcalpineobert Collins, MD, 10 mL at 01/14/15 0309  Facility-Administered Medications Ordered in Other Encounters:  .  lactated ringers infusion, , , Continuous PRN, Coralee Rudobert Flores, CRNA   Review of Systems  Vital Signs: BP 99/52 mmHg  Pulse 81  Temp(Src) 98.7 F (37.1 C) (Axillary)  Resp 16  Ht 5\' 11"  (1.803 m)  Wt 353 lb (160.12 kg)  BMI 49.26 kg/m2  SpO2 95%  Physical Exam Gen: Morbidly obese male, intubated/sedated. ENT: ET in place, obese neck Lungs: BS equal Heart: Regular   Imaging: Dg Chest Port 1 View  01/15/2015   CLINICAL DATA:  64 year old male with respiratory failure. Initial encounter.  EXAM: PORTABLE CHEST - 1 VIEW  COMPARISON:  01/14/2015 and earlier.  FINDINGS: Portable AP semi upright view at 0526 hrs. Stable endotracheal tube. Stable visible enteric tube. Stable cardiac size and mediastinal contours. Stable left subclavian approach central line. Lung volumes and ventilation have not  significantly changed since 01/10/2015. No pneumothorax or large pleural effusion.  IMPRESSION: 1.  Stable lines and tubes. 2. No significant change in ventilation since 01/10/2015.   Electronically Signed   By: Odessa FlemingH  Hall M.D.   On: 01/15/2015 07:45   Dg Chest Port 1 View  01/14/2015   CLINICAL DATA:  Respiratory failure .  EXAM: PORTABLE CHEST - 1 VIEW  COMPARISON:  01/13/2015.  FINDINGS: Endotracheal tube, NG tube, left PICC line in stable position. Mediastinum hilar structures are stable. Stable cardiomegaly. Left lower lobe remains. Mild right lower lobe infiltrate cannot be excluded. These changes could be related to bilateral pneumonia and/or pulmonary edema.  IMPRESSION: 1. Lines and tubes in stable position. 2. Persistent cardiomegaly. 3. Persistent left lower lobe pulmonary infiltrate. Mild right lower lobe pulmonary infiltrate. These findings could be related to pneumonia and/or pulmonary edema.   Electronically Signed   By: Maisie Fushomas  Register   On: 01/14/2015 07:25   Dg Chest Port 1 View  01/13/2015   CLINICAL DATA:  Endotracheal intubation  EXAM: PORTABLE CHEST - 1 VIEW  COMPARISON:  Film from earlier in the same  day  FINDINGS: Cardiac shadow is again enlarged. An endotracheal tube is again seen and has been advanced and now lies 2.0 cm above the carina. A left-sided PICC line is again noted in satisfactory position. Nasogastric catheter is noted extending into stomach. The patient is somewhat rotated accentuating the mediastinal markings. Vascular congestion remains. Some suggestion of left lower lobe consolidation is noted as well.  IMPRESSION: Endotracheal tube repositioned from the prior exam.  Nasogastric catheter and left-sided PICC line in stable position.  Mild vascular congestion.  Increased density in the left base.   Electronically Signed   By: Alcide Clever M.D.   On: 01/13/2015 16:20   Dg Chest Port 1 View  01/13/2015   CLINICAL DATA:  Respiratory failure.  Intubated.  EXAM: PORTABLE CHEST  - 1 VIEW  COMPARISON:  Yesterday.  FINDINGS: Endotracheal tube in satisfactory position. Left PICC tip at the superior cavoatrial junction. Stable enlarged cardiac silhouette and prominent pulmonary vasculature and interstitial markings. No definite pleural fluid. Nasogastric tube extending into the stomach. Thoracic spine degenerative changes.  IMPRESSION: Stable cardiomegaly, pulmonary vascular congestion and chronic interstitial lung disease.   Electronically Signed   By: Beckie Salts M.D.   On: 01/13/2015 07:45    Labs:  CBC:  Recent Labs  01/13/15 0255 01/14/15 0457 01/15/15 0455 01/16/15 0416  WBC 13.5* 16.1* 16.4* 14.8*  HGB 11.9* 12.8* 11.9* 11.6*  HCT 35.8* 37.8* 36.7* 36.1*  PLT 265 335 322 334    COAGS:  Recent Labs  01/09/15 1510  INR 1.07  APTT 25    BMP:  Recent Labs  01/13/15 0255 01/14/15 0456 01/15/15 0455 01/16/15 0416  NA 131* 136 134* 137  K 4.5 3.6 4.5 5.1  CL 95* 96* 97* 99*  CO2 21* 21* 22 23  GLUCOSE 182* 181* 218* 226*  BUN 105* 154* 96* 144*  CALCIUM 8.4* 9.0 8.3* 8.9  CREATININE 3.67* 4.89* 4.09* 5.23*  GFRNONAA 16* 12* 14* 11*  GFRAA 19* 13* 17* 12*    LIVER FUNCTION TESTS:  Recent Labs  12/30/14 0548 01/02/15 0430  01/13/15 0255 01/14/15 0456 01/15/15 0455 01/16/15 0416  BILITOT 1.1 1.9*  --   --   --   --   --   AST 50* 45*  --   --   --   --   --   ALT 34 29  --   --   --   --   --   ALKPHOS 69 80  --   --   --   --   --   PROT 6.4 7.2  --   --   --   --   --   ALBUMIN 3.1* 3.3*  < > 2.4* 2.7* 2.8* 2.5*  < > = values in this interval not displayed.  Assessment and Plan: Resp failure, for tracheostomy today Renal failure, for placement of tunneled HD cath tomorrow. Discussed procedure with son via phone, he is agreeable and consent obtained. Mildly elevated temp at ~99 WBC trending down, chart review finds all Cx negative.    I spent a total of 20 minutes face to face in clinical consultation/evaluation, greater  than 50% of which was counseling/coordinating care for dialysis catheter placement  Signed: Brayton El 01/16/2015, 1:33 PM

## 2015-01-16 NOTE — Progress Notes (Signed)
Occupational Therapy Treatment Patient Details Name: Fayrene FearingJames Valliant MRN: [id number] DOB: March 15, 1951 Today's Date: 01/16/2015    History of present illness [facility] Rt [facility].  He was intubated 4/23 am for post op hypercarbic resp failure.  US testes show masses - work up to be one by urology; 5/4 dopplers showed bil. LE DVTs; [facility]  PMH includes morbid obesity, arthritis    OT comments  PROM performed.  No evidence of skin compromise. Pt for trach today - may need to adjust/modify neck strapping of sling - will check it tomorrow.   Follow Up Recommendations  LTACH    Equipment Recommendations  Other (comment)    Recommendations for Other Services      Precautions / Restrictions Precautions Precautions: Fall;Shoulder;Other (comment) (ETT, CRRT, mulitple lines and tubes ) Type of Shoulder Precautions: Abduction sling in place at all ties.  PROM Rt elbow, wrist, and hand daily  Shoulder Interventions: Shoulder abduction pillow Precaution Comments: shoulder, abductor sling on RUE, except for PROM to elbow and wrist; then removed for this per MD Restrictions Weight Bearing Restrictions: Yes RUE Weight Bearing: Non weight bearing       Mobility Bed Mobility                  Transfers                      Balance                                   ADL                                         General ADL Comments: no evidence of skin compromise Rt UE      Vision                     Perception     Praxis      Cognition   Behavior During Therapy: Flat affect Overall Cognitive Status: Difficult to assess                       Extremity/Trunk Assessment               Exercises General Exercises - Upper Extremity Elbow Flexion: PROM;Right;Supine;15 reps Elbow Extension: PROM;Right;Supine;15 reps Wrist Flexion: PROM;Right;Supine;15 reps Wrist  Extension: PROM;Right;Supine;15 reps Digit Composite Flexion: PROM;Right;Supine;15 reps Composite Extension: PROM;Right;Supine;15 reps   Shoulder Instructions       General Comments      Pertinent Vitals/ Pain       Pain Assessment: Faces Faces Pain Scale: No hurt (Pt sedated )  Home Living                                          Prior Functioning/Environment              Frequency Min 6X/week     Progress Toward Goals  OT Goals([place] goals can now be found in the care plan section)  Progress towards OT goals: Progressing toward goals  ADL Goals Additional ADL Goal #1: pt will tolerate sling without skin  compromise, as noted during PROM to elbow and wrist and RUE ADLs  Plan Discharge plan needs to be updated    Co-evaluation                 End of Session Equipment Utilized During Treatment: Other (comment) (abduction sling )   Activity Tolerance Patient tolerated treatment well   Patient Left in bed;with nursing/sitter in room   Nurse Communication          Time: [id number] OT Time Calculation (min): 13 min  Charges:    Jeani Hawkingonarpe, Geovanny Sartin M 01/16/2015, 11:53 AM

## 2015-01-16 NOTE — Anesthesia Preprocedure Evaluation (Addendum)
Anesthesia Evaluation  Patient identified by MRN, date of birth, ID band Patient unresponsive    Reviewed: Allergy & Precautions, NPO status , Patient's Chart, lab work & pertinent test results, reviewed documented beta blocker date and time , Unable to perform ROS - Chart review only  History of Anesthesia Complications Negative for: history of anesthetic complications  Airway Mallampati: Intubated       Dental   Pulmonary Quinn Smoker,      rales    Cardiovascular hypertension, Rhythm:Regular Rate:Normal     Neuro/Psych Patient on Propofol and Fentanyl gtts.  Unable to evaluate neurological status    GI/Hepatic negative GI ROS, Neg liver ROS,   Endo/Other  Morbid obesity  Renal/GU ARFRenal disease     Musculoskeletal negative musculoskeletal ROS (+) Arthritis -,   Abdominal Normal abdominal exam  (+)   Peds  Hematology negative hematology ROS (+)   Anesthesia Other Findings   Reproductive/Obstetrics negative OB ROS                            Anesthesia Physical Anesthesia Plan  ASA: IV  Anesthesia Plan: General   Post-op Pain Management:    Induction: Intravenous  Airway Management Planned: Oral ETT  Additional Equipment:   Intra-op Plan:   Post-operative Plan: Post-operative intubation/ventilation  Informed Consent: I have reviewed the patients History and Physical, chart, labs and discussed the procedure including the risks, benefits and alternatives for the proposed anesthesia with the patient or authorized representative who has indicated his/her understanding and acceptance.   History available from chart only  Plan Discussed with: CRNA, Anesthesiologist and Surgeon  Anesthesia Plan Comments:         Anesthesia Quick Evaluation

## 2015-01-16 NOTE — Progress Notes (Signed)
ANTICOAGULATION CONSULT NOTE - Follow Up  Pharmacy Consult:  Heparin Indication:  New bilateral femoral DVTs  No Known Allergies  Patient Measurements: Height: 5\' 11"  (180.3 cm) Weight:  (348 lbs +rectal pouch 5 pillows shoulder brace urinary cath) IBW/kg (Calculated) : 75.3 Heparin Dosing Weight: 103 kg  Vital Signs: Temp: 98.4 F (36.9 C) (05/11 1633) Temp Source: Oral (05/11 1633) BP: 100/52 mmHg (05/11 1745) Pulse Rate: 79 (05/11 1745)  Labs:  Recent Labs  01/14/15 0456  01/14/15 0457 01/15/15 0454 01/15/15 0455 01/15/15 1425 01/15/15 2330 01/16/15 0416  HGB  --   < > 12.8*  --  11.9*  --   --  11.6*  HCT  --   --  37.8*  --  36.7*  --   --  36.1*  PLT  --   --  335  --  322  --   --  334  HEPARINUNFRC  --   < > 0.42 0.24*  --  0.28* 0.25*  --   CREATININE 4.89*  --   --   --  4.09*  --   --  5.23*  < > = values in this interval not displayed.  Estimated Creatinine Clearance: 22.9 mL/min (by C-G formula based on Cr of 5.23).    Assessment: 63 YOM to continue heparin 6 hours after trach  Will resume at slightly higher previous rate as heparin level last night was less than goal Resume heparin at 1950 units/hr at 22:00 tonight. F/u am HL  Thanks for allowing pharmacy to be a part of this patient's care.  Talbert CageLora Zykeriah Mathia, PharmD Clinical Pharmacist, [phone]

## 2015-01-16 NOTE — Anesthesia Postprocedure Evaluation (Signed)
  Anesthesia Post-op Note  Patient: [name]  Procedure(s) Performed: Procedure(s): TRACHEOSTOMY (N/A)  Patient Location: ICU  Anesthesia Type:General  Level of Consciousness: sedated  Airway and Oxygen Therapy: Patient placed on Ventilator (see vital sign flow sheet for setting)  Post-op Pain: none  Post-op Assessment: Post-op Vital signs reviewed  Post-op Vital Signs: Reviewed  Last Vitals:  Filed Vitals:   01/16/15 2009  BP:   Pulse:   Temp: 37.4 C  Resp:     Complications: No apparent anesthesia complications

## 2015-01-16 NOTE — Progress Notes (Signed)
PULMONARY / CRITICAL CARE MEDICINE   Name: [name] Linker MRN: [id number] DOB: 17-Oct-1950    ADMISSION DATE:  12/28/2014 CONSULTATION DATE:  12/28/14   REFERRING MD :  Imogene BurnJenkins/TRH  PT PROFILE:  [id number] morbidly obese male admitted 4/22 by Ortho for elective R rotator cuff tear. Intubated early 4/23 AM for post operative hypercarbic/hypoxic resp failure.  MAJOR EVENTS/TEST RESULTS: 4/22  elective R shoulder surgery (Dr Thomasena Edisollins) 4[facility]22 [facility] for post op med mgmt 4/23  reintubated for hypercarbic/hypoxic resp failure 4/23  US scrotum: Two solid extratesticular right scrotal masses. Primary differential diagnosis for extratesticular masses include primary or metastatic neoplasms including adenomatoid tumor, fibroma, leiomyoma, mesothelioma, or sarcoma. Urology consultation is recommended  4/26 TEE:  LVEF 60-65%, Grade 1 DD, trivial pericardial effusion 4/27 US testes with masses 5/02 renal US:  no hydronephrosis, Possible nonobstructing stone lower pole left kidney 5/02 initial ENT consult. Trach tube planned but pt deemed too critically ill @ the time to proceed 5/03 Renal consult for AKI 5/04 LE venous duplex:  B DVT present 5/04 [facility] 5/06 Off pressors. Transitioned off CRRT to intermittent HD 5/08 Failed extubation attempt 5/09 ENT re- consult for trach tube placement 5/10 Tolerated PS 10 cm H2O. Seen by ENT. Trach tube placement planned 5/11  DEVICES/LINES/TUBES: ETT 4/23 >> 5/08, 5/08 >>  RUE PICC 4/23 >>  L femoral HD cath 5/04 >>   MICRO: MRSA PCR 4/22 >> NEG Urine 4.23 >> NEG resp 4/23 >> NEG Urine 4/23 >> NEG Resp 4/27 >> NEG Blood 4/27 >> NEG Resp 5/02 >> NEG  ANTIBIOTICS: Cefazolin 4/22 >> 4/23 Levofloxacin 4/23 >> 4/27 Cefepime 4/27 >> 5/05 Vanc 4/27 >> 5/01   SUBJECTIVE:   Remains on sedation. RASS -3. Not F/C  VITAL SIGNS: Temp:  [98.9 F (37.2 C)-99.9 F (37.7 C)] 99.2 F (37.3 C) (05/11 0825) Pulse Rate:  [81-93] 81 (05/11  1030) Resp:  [13-22] 13 (05/11 1030) BP: (83-123)/(46-65) 111/58 mmHg (05/11 1030) SpO2:  [91 %-96 %] 95 % (05/11 1030) FiO2 (%):  [40 %] 40 % (05/11 0800)   VENTILATOR SETTINGS: Vent Mode:  [-] PRVC FiO2 (%):  [40 %] 40 % Set Rate:  [16 bmp-20 bmp] 16 bmp Vt Set:  [600 mL] 600 mL PEEP:  [5 cmH20] 5 cmH20 Pressure Support:  [10 cmH20] 10 cmH20 Plateau Pressure:  [17 cmH20-21 cmH20] 20 cmH20   INTAKE / OUTPUT:  Intake/Output Summary (Last 24 hours) at 01/16/15 1051 Last data filed at 01/16/15 0900  Gross per 24 hour  Intake 2458.06 ml  Output    655 ml  Net 1803.06 ml    PHYSICAL EXAMINATION: General: obese, RASS -3, not F/C Neuro: no focal deficits HEENT: NCAT, thick, short neck Cardiovascular: regular, no M Lungs: Clear anteriorly, no wheeze Abdomen: soft, non tender Ext: R shoulder in sling, 1+ symmetric edema  LABS:  CBC  Recent Labs Lab 01/14/15 0457 01/15/15 0455 01/16/15 0416  WBC 16.1* 16.4* 14.8*  HGB 12.8* 11.9* 11.6*  HCT 37.8* 36.7* 36.1*  PLT 335 322 334   Coag's  Recent Labs Lab 01/09/15 1510  APTT 25  INR 1.07     BMET  Recent Labs Lab 01/14/15 0456 01/15/15 0455 01/16/15 0416  NA 136 134* 137  K 3.6 4.5 5.1  CL 96* 97* 99*  CO2 21* 22 23  BUN 154* 96* 144*  CREATININE 4.89* 4.09* 5.23*  GLUCOSE 181* 218* 226*   Electrolytes  Recent Labs Lab 01/10/15  [id number]  01/11/15 0330  01/12/15 0440  01/14/15 0456 01/15/15 0455 01/16/15 0416  CALCIUM 8.3*  8.3*  < > 8.1*  < >  --   < > 9.0 8.3* 8.9  MG 3.5*  --  2.8*  --  2.9*  --   --   --   --   PHOS 6.0*  < > 3.8  < >  --   < > 6.8* 6.7* 10.6*  < > = values in this interval not displayed.   Liver Enzymes  Recent Labs Lab 01/14/15 0456 01/15/15 0455 01/16/15 0416  ALBUMIN 2.7* 2.8* 2.5*   Cardiac Enzymes No results for input(s): TROPONINI, PROBNP in the last 168 hours. Glucose  Recent Labs Lab 01/15/15 1130 01/15/15 1507 01/15/15 1902 01/15/15 2317  01/16/15 0325 01/16/15 0741  GLUCAP 233* 181* 178* 182* 231* 164*    CXR: NNF    ASSESSMENT / PLAN:  PULMONARY A:  Acute hypoxic/hypercapnic respiratory Probable OSA/OHS Tobacco abuse P:   Cont vent support - settings reviewed and/or adjusted Work in PSV mode as tolerated Cont vent bundle Daily SBT if/when meets criteria Trach tube planned today  CARDIOVASCULAR A:  Hx of HTN and chronic diastolic heart failure Minimally elevated troponin (0.48 on 5/01) - suspected demand ischemia Hx of HLD VT episode 4/26 B/l lower extremity DVT P:  Continue ASA Cont UFH gtt until after trach tube placement  Then transition to warfarin  RENAL A:   AKI 2nd to ATN Scrotal mass - AFP and beta HCG normal P:   Monitor BMET intermittently Monitor I/Os Correct electrolytes as indicated HD per Renal Discussed with Dr Kathrene BongoGoldsborough - permacath to be ordered per IR  GASTROINTESTINAL A:  High GRVs - resolved Constipation, resolved P:   SUP: enteral famotidine Cont TFs Cont EES as motility agent (started 5/09)  HEMATOLOGIC A:   Polycythemia - likely chronic hypoxemia. Resolved P:  DVT px: heparin gtt (on hold for trach tube today) Monitor CBC intermittently Transfuse per usual ICU guidelines Transition to warfarin after trach tube and Permacath placed  INFECTIOUS A:   Possible HCAP, treated P:  Monitor temp, WBC count Micro and abx as above Cont to monitor off Abx  ENDOCRINE A:   Hyperglycemia without prior documentation of DM P:   Cont Lantus - increased 5/09 Cont SSI  NEUROLOGIC A:  Acute encephalopathy Post op pain ICU associated discomfort/agitation Severe deconditioning P:   RASS goal -2, -3 Cont propofol - transition off after trach Cont fentanyl - transition off after trach Will need PT and rehab vs LTACH  ORTHO A: Rt shoulder surgery 4/22. P: Post op mgmt per ortho     Discussed with Dr Kathrene BongoGoldsborough  CC time 30 minutes  Billy Fischeravid Simonds,  MD ; [facility] Mobile [phone].  After 5:30 PM or weekends, call [phone]

## 2015-01-16 NOTE — Progress Notes (Signed)
[facility] Progress Note   Subjective:  Events noted-remains on vent- plan is for trach today- UOP 600's but azotemia worse today   Filed Vitals:   01/16/15 0400 01/16/15 0430 01/16/15 0500 01/16/15 0600  BP: 105/52  110/52 103/53  Pulse: 87  87 86  Temp:  99.9 F (37.7 C)    TempSrc:  Oral    Resp: 20  18 18   Height:      Weight:      SpO2: 93%  94% 93%   Exam: On vent, eyes closed- sedated  No jvd Chest clear ant and lat RRR no rub or gallop Abd very obese, ND NT +BS x 4 Temp L groin HD cath in place GU foley in place draining dark brown urine Ext mild-moderate diffuse edema of LE's and UE's Neuro is sedated on the vent  Renal US 11-13 cm kidneys , no hydro UA 5.0, 1.020, prot 30, rbc 21-50/hpf, wbc 0-2/hpf UNa 30, UCreat 230 CXR 5/8 no chg mild vasc congestion, chronic IS pattern  Summary: 64 yo w DJD/ obesity underwent elective R rotator cuff repair surgery on 4/22. Normal creatinine at baseline. Postop had hypercarbic resp failure/ HCAP and septic shock rx w vasopressors and IV vanc/cefipime. Developed AKI 4/30 while still on pressors and IV abx. Vanc level on 5/1 elevated at 43 ug/mL. RRT started on 5/4 w CRRT from 5/4 - 5/7.   Assessment: 1. AKI - ATN due to shock/ vanc toxicity. S/P CRRT 5/4-5/7. Then an IHD treatment 5/9. Was making better urine, but now dropped off.  HD today for azotemia via femoral cath placed 5/4.  I have placed order for a tunneled upper PC to be placed before started on coumadin.  I am still hopeful for eventual recovery given normal renal function at baseline and nonoliguria but it could take some time 2. Vol excess moderate diffuse edema- volume removal difficult due to low BP's- removed 2 liters 5/9- trying for some today as well 3. Shock / fevers - all cx's negative, s/p 2 wks abx and off pressors. BP's soft but not sure reliable due to size.  4. S/P R rotator cuff repair 4/22 5. Morbid obesity 6. VDRF- needed to be  reintubated - plan is for trach today  7. Bilat LE DVT / suspected PE - on IV hep- to transition to coumadin 8. Hyperphos- no treatment yet until PO's 9. Hypokalemia- K 5 today- run on 2 K bath 10. Anemia- previous polycythremia- hgb now 11.6       Gustavus Haskin A   01/16/2015, 7:15 AM     Recent Labs Lab 01/14/15 0456 01/15/15 0455 01/16/15 0416  NA 136 134* 137  K 3.6 4.5 5.1  CL 96* 97* 99*  CO2 21* 22 23  GLUCOSE 181* 218* 226*  BUN 154* 96* 144*  CREATININE 4.89* 4.09* 5.23*  CALCIUM 9.0 8.3* 8.9  PHOS 6.8* 6.7* 10.6*    Recent Labs Lab 01/14/15 0456 01/15/15 0455 01/16/15 0416  ALBUMIN 2.7* 2.8* 2.5*    Recent Labs Lab 01/14/15 0457 01/15/15 0455 01/16/15 0416  WBC 16.1* 16.4* 14.8*  HGB 12.8* 11.9* 11.6*  HCT 37.8* 36.7* 36.1*  MCV 92.2 95.3 95.0  PLT 335 322 334   . alteplase  2 mg Intracatheter Once  . antiseptic oral rinse  7 mL Mouth Rinse QID  . aspirin  81 mg Per Tube Daily  . chlorhexidine  1 application Topical Once  . chlorhexidine  15 mL Mouth Rinse  BID  . erythromycin ethylsuccinate  400 mg Oral TID  . famotidine  20 mg Per Tube Daily  . feeding supplement (PRO-STAT SUGAR FREE 64)  60 mL Per Tube QID  . feeding supplement (VITAL HIGH PROTEIN)  1,000 mL Per Tube Q24H  . folic acid  1 mg Per Tube Daily  . insulin aspart  0-20 Units Subcutaneous 6 times per day  . insulin glargine  15 Units Subcutaneous BID  . sodium chloride  10-40 mL Intracatheter Q12H   . sodium chloride Stopped (01/15/15 2336)  . dextrose 5 % and 0.9% NaCl 100 mL/hr at 01/15/15 2336  . fentaNYL infusion INTRAVENOUS 100 mcg/hr (01/15/15 2000)  . propofol (DIPRIVAN) infusion 5 mcg/kg/min (01/16/15 0303)   acetaminophen (TYLENOL) oral liquid 160 mg/5 mL, fentaNYL, heparin, heparin, heparin, heparin, hydrALAZINE, lidocaine (PF), lidocaine-prilocaine, lip balm, midazolam, [DISCONTINUED] ondansetron **OR** ondansetron (ZOFRAN) IV, sodium chloride

## 2015-01-17 ENCOUNTER — Inpatient Hospital Stay (HOSPITAL_COMMUNITY): Payer: Worker's Compensation

## 2015-01-17 ENCOUNTER — Encounter (HOSPITAL_COMMUNITY): Payer: Self-pay | Admitting: Otolaryngology

## 2015-01-17 LAB — CBC
HEMATOCRIT: 34.5 % — AB (ref 39.0–52.0)
Hemoglobin: 11.1 g/dL — ABNORMAL LOW (ref 13.0–17.0)
MCH: 31.2 pg (ref 26.0–34.0)
MCHC: 32.2 g/dL (ref 30.0–36.0)
MCV: 96.9 fL (ref 78.0–100.0)
Platelets: 281 10*3/uL (ref 150–400)
RBC: 3.56 MIL/uL — ABNORMAL LOW (ref 4.22–5.81)
RDW: 17.2 % — AB (ref 11.5–15.5)
WBC: 13 10*3/uL — ABNORMAL HIGH (ref 4.0–10.5)

## 2015-01-17 LAB — RENAL FUNCTION PANEL
Albumin: 2.7 g/dL — ABNORMAL LOW (ref 3.5–5.0)
Anion gap: 13 (ref 5–15)
BUN: 108 mg/dL — ABNORMAL HIGH (ref 6–20)
CHLORIDE: 103 mmol/L (ref 101–111)
CO2: 24 mmol/L (ref 22–32)
Calcium: 8.7 mg/dL — ABNORMAL LOW (ref 8.9–10.3)
Creatinine, Ser: 4.78 mg/dL — ABNORMAL HIGH (ref 0.61–1.24)
GFR calc Af Amer: 14 mL/min — ABNORMAL LOW (ref >60)
GFR, EST NON AFRICAN AMERICAN: 12 mL/min — AB (ref >60)
Glucose, Bld: 172 mg/dL — ABNORMAL HIGH (ref 65–99)
Phosphorus: 10.4 mg/dL — ABNORMAL HIGH (ref 2.5–4.6)
Potassium: 5.5 mmol/L — ABNORMAL HIGH (ref 3.5–5.1)
Sodium: 140 mmol/L (ref 135–145)

## 2015-01-17 LAB — HEPARIN LEVEL (UNFRACTIONATED)
HEPARIN UNFRACTIONATED: 0.16 [IU]/mL — AB (ref 0.30–0.70)
HEPARIN UNFRACTIONATED: 0.34 [IU]/mL (ref 0.30–0.70)

## 2015-01-17 LAB — GLUCOSE, CAPILLARY
GLUCOSE-CAPILLARY: 151 mg/dL — AB (ref 65–99)
GLUCOSE-CAPILLARY: 134 mg/dL — AB (ref 65–99)
Glucose-Capillary: 121 mg/dL — ABNORMAL HIGH (ref 65–99)
Glucose-Capillary: 151 mg/dL — ABNORMAL HIGH (ref 65–99)
Glucose-Capillary: 154 mg/dL — ABNORMAL HIGH (ref 65–99)
Glucose-Capillary: 156 mg/dL — ABNORMAL HIGH (ref 65–99)

## 2015-01-17 MED ORDER — FENTANYL CITRATE (PF) 100 MCG/2ML IJ SOLN
25.0000 ug | INTRAMUSCULAR | Status: DC | PRN
  Administered 2015-01-17: 100 ug via INTRAVENOUS
  Administered 2015-01-17: 50 ug via INTRAVENOUS
  Filled 2015-01-17 (×2): qty 2

## 2015-01-17 MED ORDER — "THROMBI-PAD 3""X3"" EX PADS"
1.0000 | MEDICATED_PAD | Freq: Once | CUTANEOUS | Status: AC
  Administered 2015-01-17: 1 via TOPICAL
  Filled 2015-01-17: qty 1

## 2015-01-17 MED ORDER — LORAZEPAM 2 MG/ML IJ SOLN: 0.5000 mg | INTRAMUSCULAR | Status: DC | PRN

## 2015-01-17 MED ORDER — METOCLOPRAMIDE HCL 5 MG/ML IJ SOLN
5.0000 mg | Freq: Once | INTRAMUSCULAR | Status: AC
  Administered 2015-01-17: 5 mg via INTRAVENOUS
  Filled 2015-01-17: qty 1

## 2015-01-17 MED ORDER — ERYTHROMYCIN ETHYLSUCCINATE 200 MG/5ML PO SUSR
400.0000 mg | Freq: Three times a day (TID) | ORAL | Status: DC
  Administered 2015-01-17 – 2015-01-19 (×6): 400 mg
  Filled 2015-01-17 (×8): qty 10

## 2015-01-17 NOTE — Progress Notes (Signed)
Patient vomited 300 mls of brown emesis.  Patient placed to ILWS. Dr Sung AmabileSimonds notified.Will continue to monitor.

## 2015-01-17 NOTE — Progress Notes (Signed)
Noted pt had increase bleeding around stoma site from trach collar. Informed pharmacy, heparin rate decreased and Dr Delton CoombesByrum notified too.

## 2015-01-17 NOTE — Progress Notes (Signed)
Patient ID: [name], male   DOB: [birth year], 64 y.o.   MRN: [id number]   Pt has been rescheduled to 5/13 For tunneled HD catheter  In dialysis now in room with temp femoral catheter at this time  Will keep npo after MN for 5/13 IR procedure

## 2015-01-17 NOTE — Progress Notes (Signed)
PULMONARY / CRITICAL CARE MEDICINE   Name: [name] Francesconi MRN: [id number] DOB: [birth year]    ADMISSION DATE:  12/28/2014 CONSULTATION DATE:  12/28/14   REFERRING MD :  Imogene BurnJenkins/TRH  PT PROFILE:  [age] morbidly obese male admitted 4/22 by Ortho for elective R rotator cuff tear. Intubated early 4/23 AM for post operative hypercarbic/hypoxic resp failure.  MAJOR EVENTS/TEST RESULTS: 4/22  elective R shoulder surgery (Dr Thomasena Edisollins) 4/22 [facility] for post op med mgmt 4/23  reintubated for hypercarbic/hypoxic resp failure 4/23  US scrotum: Two solid extratesticular right scrotal masses. Primary differential diagnosis for extratesticular masses include primary or metastatic neoplasms including adenomatoid tumor, fibroma, leiomyoma, mesothelioma, or sarcoma. Urology consultation is recommended  4/26 TEE:  LVEF 60-65%, Grade 1 DD, trivial pericardial effusion 4/27 US testes with masses 5/02 renal US:  no hydronephrosis, Possible nonobstructing stone lower pole left kidney 5/02 initial ENT consult. Trach tube planned but pt deemed too critically ill @ the time to proceed 5/03 Renal consult for AKI 5/04 LE venous duplex:  B DVT present 5/04 [facility] 5/06 Off pressors. Transitioned off CRRT to intermittent HD 5/08 Failed extubation attempt 5/09 ENT re- consult for trach tube placement 5/10 Tolerated PS 10 cm H2O. Seen by ENT. Trach tube placement planned 5/11 5/11 Trach tube placement [facility]([place]) 5/12 Tunneled HD cath planned  (IR). Tolerating ATC. Continuous sedation discontinued.   DEVICES/LINES/TUBES: ETT 4/23 >> 5/08, 5/08 >> 5/11 Trach tube 5/11 >>  RUE PICC 4/23 >>  L femoral HD cath 5/04 >>   MICRO: MRSA PCR 4/22 >> NEG Urine 4.23 >> NEG resp 4/23 >> NEG Urine 4/23 >> NEG Resp 4/27 >> NEG Blood 4/27 >> NEG Resp 5/02 >> NEG  ANTIBIOTICS: Cefazolin 4/22 >> 4/23 Levofloxacin 4/23 >> 4/27 Cefepime 4/27 >> 5/05 Vanc 4/27 >> 5/01   SUBJECTIVE:   RASS -2. + F/C.  Comfortable on ATC  VITAL SIGNS: Temp:  [98.2 F (36.8 C)-99.4 F (37.4 C)] 98.2 F (36.8 C) (05/12 1345) Pulse Rate:  [75-90] 88 (05/12 1430) Resp:  [12-22] 16 (05/12 1430) BP: (99-178)/(51-93) 175/80 mmHg (05/12 1430) SpO2:  [88 %-98 %] 98 % (05/12 1430) FiO2 (%):  [40 %] 40 % (05/12 1430) Weight:  [146 kg (321 lb 14 oz)] 146 kg (321 lb 14 oz) (05/12 1345)   VENTILATOR SETTINGS: Vent Mode:  [-] PRVC FiO2 (%):  [40 %] 40 % Set Rate:  [16 bmp] 16 bmp Vt Set:  [600 mL] 600 mL PEEP:  [5 cmH20] 5 cmH20 Plateau Pressure:  [18 cmH20-22 cmH20] 20 cmH20   INTAKE / OUTPUT:  Intake/Output Summary (Last 24 hours) at 01/17/15 1442 Last data filed at 01/17/15 1300  Gross per 24 hour  Intake 2399.06 ml  Output    845 ml  Net 1554.06 ml    PHYSICAL EXAMINATION: General: obese, RASS -2, +F/C Neuro: no focal deficits HEENT: NCAT, thick, short neck, moderate bleeding from trach tube site Cardiovascular: regular, no M Lungs: Clear anteriorly, no wheeze Abdomen: soft, non tender Ext: R shoulder in sling, 1+ symmetric edema  LABS:  CBC  Recent Labs Lab 01/15/15 0455 01/16/15 0416 01/17/15 0500  WBC 16.4* 14.8* 13.0*  HGB 11.9* 11.6* 11.1*  HCT 36.7* 36.1* 34.5*  PLT 322 334 281   Coag's No results for input(s): APTT, INR in the last 168 hours.   BMET  Recent Labs Lab 01/15/15 0455 01/16/15 0416 01/17/15 0500  NA 134* 137 140  K 4.5 5.1 5.5*  CL 97* 99* 103  CO2 22 23 24   BUN 96* 144* 108*  CREATININE 4.09* 5.23* 4.78*  GLUCOSE 218* 226* 172*   Electrolytes  Recent Labs Lab 01/11/15 0330  01/12/15 0440  01/15/15 0455 01/16/15 0416 01/17/15 0500  CALCIUM 8.1*  < >  --   < > 8.3* 8.9 8.7*  MG 2.8*  --  2.9*  --   --   --   --   PHOS 3.8  < >  --   < > 6.7* 10.6* 10.4*  < > = values in this interval not displayed.   Liver Enzymes  Recent Labs Lab 01/15/15 0455 01/16/15 0416 01/17/15 0500  ALBUMIN 2.8* 2.5* 2.7*   Cardiac Enzymes No results for  input(s): TROPONINI, PROBNP in the last 168 hours. Glucose  Recent Labs Lab 01/16/15 1644 01/16/15 2007 01/16/15 2339 01/17/15 0352 01/17/15 0732 01/17/15 1152  GLUCAP 117* 136* 151* 156* 154* 151*    CXR: NNF    ASSESSMENT / PLAN:  PULMONARY A:  Acute on chronic hypoxic/hypercapnic respiratory Probable OSA/OHS Tobacco abuse P:   Cont vent support - settings reviewed and/or adjusted Work in PSV > ATC as tolerated Cont vent bundle  CARDIOVASCULAR A:  Hx of HTN  H/O chronic diastolic heart failure Minimally elevated troponin (0.48 5/01)   - suspected demand ischemia  - No further eval planned Hx of HLD VT episode 4/26 B/l lower extremity DVT P:  Continue ASA Cont UFH gtt until after HD cath placement  Then transition to warfarin  RENAL A:   AKI 2nd to ATN Scrotal mass - AFP and beta HCG normal P:   Monitor BMET intermittently Monitor I/Os Correct electrolytes as indicated HD per Renal When recovers sufficiently from Coss illness, consider Urology eval for scrotal mass  GASTROINTESTINAL A:  High GRVs - recurrent Constipation, resolved P:   SUP: enteral famotidine Cont TFs Cont EES as motility agent (started 5/09)  HEMATOLOGIC A:   Polycythemia - likely chronic hypoxemia. Resolved ICU acquired anemia without acute blood loss P:  DVT px: heparin gtt  Monitor CBC intermittently Transfuse per usual ICU guidelines Transition to warfarin after Permacath placed  INFECTIOUS A:   Possible HCAP, treated P:  Monitor temp, WBC count Micro and abx as above Cont to monitor off Abx  ENDOCRINE A:   Hyperglycemia without prior documentation of DM P:   Cont Lantus - increased 5/09 Cont SSI  NEUROLOGIC A:  Acute encephalopathy Post op pain ICU associated discomfort/agitation Severe deconditioning P:   RASS goal 0, -1 Dc propofol Change fent to PRN Plan LTACH transfer 5/13  ORTHO A: Rt shoulder surgery 4/22 P: Post op mgmt per  ortho Call to ortho to provide guidance after [facility] to [facility]    Billy Fischeravid Simonds, MD ; [facility] Mobile [phone].  After 5:30 PM or weekends, call [phone]

## 2015-01-17 NOTE — Discharge Summary (Signed)
Physician Discharge Summary  Patient ID: [name] MRN: [id number] DOB/AGE: Feb 26, 1951 64 y.o.  Admit date: 12/28/2014 Discharge date: 01/19/2015    Discharge Diagnoses:  Right Rotator Cuff Repair Acute on Chronic Hypoxic / Hypercapnic Respiratory Failure Suspected OSA / OHS  Tobacco Abuse  Hypertension  Chronic Diastolic Heart Failure Demand Ischemia  Hyperlipidemia Ventricular Tachycardia (isolated episode 4/26) Bilateral Lower Extremity DVT Acute Kidney Injury secondary to ATN Scrotal Mass - AFP & beta HCG normal High Gastric Residual Volumes - recurrent  Constipation - resolved Polycythemia  Anemia  Hyperglycemia  Acute Encephalopathy  Post-operative Pain  Severe Deconditioning    DEVICES/LINES/TUBES: ETT 4/23 >> 5/08, 5/08 >> 5/11 Trach tube 5/11 >>  RUE PICC 4/23 >>  L femoral HD cath 5/04 >>  IVC filter 5/13  Tests/studies 4/23 US scrotum: Two solid extratesticular right scrotal masses. Primary differential diagnosis for extratesticular masses include primary or metastatic neoplasms including adenomatoid tumor, fibroma, leiomyoma, mesothelioma, or sarcoma. Urology consultation is recommended  4/26  TEE: LVEF 60-65%, Grade 1 DD, trivial pericardial effusion 4/27  [place] testes with masses 5/02  renal US: no hydronephrosis, Possible nonobstructing stone lower pole left kidney  MICRO: MRSA PCR 4/22 >> NEG Urine 4.23 >> NEG resp 4/23 >> NEG Urine 4/23 >> NEG Resp 4/27 >> NEG Blood 4/27 >> NEG Resp 5/02 >> NEG  ANTIBIOTICS: Cefazolin 4/22 >> 4/23 Levofloxacin 4/23 >> 4/27 Cefepime 4/27 >> 5/05 Vanc 4/27 >> 5/01  CONSULTS Nephrology Orthopedics                                           DISCHARGE SUMMARY   [name] is a 64 y.o. y/o male, smoker,  with a PMH of HTN, suspected COPD (38 pk yr hx and pre-op Hgb of 17 suggestive of hypoxia) who was admitted on 4/22 for an elective right shoulder surgery for rotator cuff repair. The patient had a  difficult post-operative course beginning with difficulty extubating after surgery.  He was admitted to SDU for observation.  On 4/23, he developed progressive acute on chronic hypercarbic / hypoxic respiratory failure w/ increased work of breathing, accessory muscle use and somnolence, ABG obtained and ph 7/19/ PCO2 77, and PO2 of 76% on 100% FIO2, (his baseline expected PCO2 was 51.5 mmHG) this was in spite of NIPPV so he was re-intubated.His hospital course was complicated by: possible HCAP (treated w/ empiric abx and No organism identified) , decompensated diastolic HF, demand ischemia and circulatory shock for which he required pressor support.He had difficulty weaning him and initially we consulted ENT on 5/2 Trach tube, but pt deemed too critically ill @ the time to proceed. On 5/3 is renal function continued to worsen (creatinine up to 4.1 and BUN up to 165)  and we consulted Nephrology. They felt acute renal failure was d/t ATN after shock. On 5/04  LE venous duplex were obtained to evaluate LE edema. This was positive for bilateral Lower extremity DVT. He was started on anticoagulation. He had on-going acute encephalopathy. He was also Transfered to [facility] for CRRT. On 5/06  Off pressors. Transitioned off CRRT to intermittent HD. 5/08  Failed extubation attempt and was re-intubated. 5/09  ENT re- consult for trach tube placement. We continued weaning efforts. He underwent  Trach tube placement [facility]) on 5/64. On 5/12 he was Tolerating ATC. Continuous sedation discontinued. The evening of 5/12 he was  noted to have marked trach site bleeding and heparin gtt was stopped. ENT was emergently consulted. He underwent emergent exploration of trach site for active and ongoing hemorrhage at 0200 in the am of 5/13. Surgical findings identified: diffuse subcutaneous, muscular, and peritracheal oozing that was all thoroughly and meticulously cauterized using the Bovie until hemostasis was noted. He returned to the ICU  on full vent support w/ replaced #8 trach w/ cuff inflated.  On 5/13 he went for IVC filter as well as his right ij tunneled HD catheter.  At time of discharge his tracheal bleeding has essentially resolved, he has had his IJ HD cath placed and his IVC filter has been placed. He appears comfortable on ATC. His active issues are as noted below.                                      DISCHARGE PLAN BY DIAGNOSIS    Right Rotator Cuff Repair  Discharge Plan: Continue ROM of RUL Will need to call Dr Theda Sers for PT recs when pt able to tolerate Physical therapy   Acute on Chronic Hypoxic / Hypercapnic Respiratory Failure Suspected OSA / OHS  Tobacco Abuse  Tracheostomy Status  Bleeding from Tracheostomy - while on heparin  Discharge Plan: Cont vent support - wean as tolerated per protocol  Work on PSV with progression to  ATC as tolerated Doubt a candidate for decannulation, reasonable to consider down size to 6 at some point  ENT would like you to call Dr Erik Obey re: timing of trach change and prior to resuming any heparin   Hypertension  Chronic Diastolic Heart Failure Demand Ischemia  Hyperlipidemia Ventricular Tachycardia (isolated episode 4/26) Bilateral Lower Extremity DVT s/p IVC filter 5/13  Discharge Plan: Continue ASA May be able to resume heparin and bridge to coumadin in future. This will depend of no further bleeding issues.  If so will need IR to retract IVC filter.    Acute Kidney Injury secondary to ATN-->renal remains hopeful that he may make a renal recovery Scrotal Mass - AFP & beta HCG normal   Discharge Plan: Monitor BMET intermittently Monitor I/Os Correct electrolytes as indicated HD per Renal When recovers sufficiently from [place] illness, consider Urology eval for scrotal mass  High Gastric Residual Volumes - recurrent  Constipation - resolved  Discharge Plan: SUP: enteral famotidine Cont TFs Cont EES as motility agent (started  5/09)  Polycythemia  Anemia   Discharge Plan: Monitor CBC intermittently Transfuse per usual ICU guidelines  Hyperglycemia   Discharge Plan: Cont Lantus - increased 5/09 Cont SSI  Acute Encephalopathy  Post-operative Pain  Severe Deconditioning   Discharge Plan: Continue PRN Fentanyl  Needs PT/OT services        Filed Vitals:   01/19/15 1200 01/19/15 1229 01/19/15 1247 01/19/15 1300  BP: 111/60 111/60  112/68  Pulse: 87   89  Temp:   99.1 F (37.3 C)   TempSrc: Oral  Oral Oral  Resp: $Remo'15 16  20  'egtoa$ Height:      Weight:      SpO2: 93% 96%  99%   Discharge Exam: General: obese, RASS -2, Not F/C Neuro: no focal deficits HEENT: NCAT, thick, short neck, old blood at trach site Cardiovascular: regular, no M Lungs: Clear anteriorly, no wheeze Abdomen: soft, non tender Ext: R shoulder in sling, 1+ symmetric edema Discharge Labs  BMET  Recent Labs Lab  01/15/15 0455 01/16/15 0416 01/17/15 0500 01/18/15 0455 01/19/15 0521  NA 134* 137 140 141 141  K 4.5 5.1 5.5* 5.5* 5.2*  CL 97* 99* 103 99* 97*  CO2 $Re'22 23 24 27 27  'tft$ GLUCOSE 218* 226* 172* 168* 165*  BUN 96* 144* 108* 105* 133*  CREATININE 4.09* 5.23* 4.78* 4.90* 5.14*  CALCIUM 8.3* 8.9 8.7* 9.1 9.1  PHOS 6.7* 10.6* 10.4* 11.7* 11.4*    CBC  Recent Labs Lab 01/17/15 0500 01/18/15 0455 01/19/15 0521  HGB 11.1* 11.4* 11.4*  HCT 34.5* 35.9* 36.9*  WBC 13.0* 12.5* 12.8*  PLT 281 263 238         Discharge Instructions    Increase activity slowly as tolerated    Complete by:  As directed      Increase activity slowly    Complete by:  As directed                Medication List    STOP taking these medications        HYDROcodone-acetaminophen 5-325 MG per tablet  Commonly known as:  NORCO/VICODIN     ibuprofen 200 MG tablet  Commonly known as:  ADVIL,MOTRIN      TAKE these medications        acetaminophen 160 MG/5ML solution  Commonly known as:  TYLENOL  Place 20.3 mLs (650 mg  total) into feeding tube every 6 (six) hours as needed for mild pain, headache or fever.     antiseptic oral rinse 0.05 % Liqd solution  Commonly known as:  CPC / CETYLPYRIDINIUM CHLORIDE 0.05%  7 mLs by Mouth Rinse route QID.     aspirin 81 MG chewable tablet  Place 1 tablet (81 mg total) into feeding tube daily.     chlorhexidine 0.12 % solution  Commonly known as:  PERIDEX  15 mLs by Mouth Rinse route 2 (two) times daily.     erythromycin ethylsuccinate 200 MG/5ML suspension  Commonly known as:  EES  Place 10 mLs (400 mg total) into feeding tube 3 (three) times daily.     famotidine 40 MG/5ML suspension  Commonly known as:  PEPCID  Place 2.5 mLs (20 mg total) into feeding tube daily.     feeding supplement (PRO-STAT SUGAR FREE 64) Liqd  Place 60 mLs into feeding tube 4 (four) times daily.     feeding supplement (VITAL HIGH PROTEIN) Liqd liquid  Place 1,000 mLs into feeding tube daily.     fentaNYL 100 MCG/2ML injection  Commonly known as:  SUBLIMAZE  Inject 0.5-2 mLs (25-100 mcg total) into the vein every 2 (two) hours as needed for severe pain.     folic acid 1 MG tablet  Commonly known as:  FOLVITE  Place 1 tablet (1 mg total) into feeding tube daily.     heparin 1000 unit/mL Soln injection  1 mL (1,000 Units total) by Dialysis route as needed (in dialysis).     hydrALAZINE 20 MG/ML injection  Commonly known as:  APRESOLINE  Inject 0.5-2 mLs (10-40 mg total) into the vein every 4 (four) hours as needed (To maintain SBP < 170 mmHg).     insulin aspart 100 UNIT/ML injection  Commonly known as:  novoLOG  Inject 0-20 Units into the skin every 4 (four) hours.     insulin glargine 100 UNIT/ML injection  Commonly known as:  LANTUS  Inject 0.15 mLs (15 Units total) into the skin 2 (two) times daily.     lip balm  ointment  Apply topically as needed for lip care.     LORazepam 2 MG/ML injection  Commonly known as:  ATIVAN  Inject 0.25-0.5 mLs (0.5-1 mg total) into  the vein every 4 (four) hours as needed for anxiety.     ondansetron 4 MG/2ML Soln injection  Commonly known as:  ZOFRAN  Inject 2 mLs (4 mg total) into the vein every 6 (six) hours as needed for nausea.     oxyCODONE-acetaminophen 5-325 MG per tablet  Commonly known as:  ROXICET  Take 1-2 tablets by mouth every 4 (four) hours as needed for severe pain.     sodium chloride 0.9 % infusion  kvo     sodium chloride 0.9 % injection  10-40 mLs by Intracatheter route as needed (flush).          Disposition:  [place] for ventilator weaning / rehab efforts.   Discharged Condition: [name] has met maximum benefit of inpatient care and is medically stable and cleared for discharge.  Patient is pending follow up as above.      Time spent on disposition:  Greater than 35 minutes.   Signed: Noe Gens, NP-C [facility] Pgr: [phone] Office: [phone]    Attending Note:  I have examined patient, reviewed labs, studies and notes. I have discussed the case with Jerrye Bushy, and I agree with the data and plans as amended above.  Baltazar Apo, MD, PhD 01/19/2015, 3:48 PM [facility] [phone] or if no answer [phone]

## 2015-01-17 NOTE — Progress Notes (Signed)
Patient seen for trach team consult.  Patient currently on 40% ATC, tolerating well.  All supplies in room.  No education given at this time.  Will continue to monitor.

## 2015-01-17 NOTE — Progress Notes (Signed)
[name] Progress Note   Subjective:  No acute events overnight. Yesterday trach tube placed by ENT. Last HD 5/11 (out 1.8L). Reduced urine output from 655 to 435cc yesterday.    Filed Vitals:   01/17/15 0600 01/17/15 0630 01/17/15 0700 01/17/15 0800  BP: 127/67 120/65 134/65 139/67  Pulse: 78 75 77 80  Temp:      TempSrc:      Resp: 17 15 17 19   Height:      Weight:      SpO2: 96% 96% 97% 97%   Exam:  Gen - s/p trach, remains on sedation, NGT with feeds Neck - No JVD Chest - Mostly clear breath sounds anterior and lateral fields Heart - RRR no rub or gallop Abd - very obese, soft, NTND, +BS active Skin - Temp L groin HD cath in place GU foley in place draining dark brown urine Ext mild-moderate diffuse pitting +2 edema of LE's and UE's Neuro is sedated  Renal US 11-13 cm kidneys , no hydro UA 5.0, 1.020, prot 30, rbc 21-50/hpf, wbc 0-2/hpf UNa 30, UCreat 230 CXR 5/8 no chg mild vasc congestion, chronic IS pattern  Summary: 64 yo w DJD/ obesity underwent elective R rotator cuff repair surgery on 4/22. Normal creatinine at baseline. Postop had hypercarbic resp failure/ HCAP and septic shock rx w vasopressors and IV vanc/cefipime. Developed AKI 4/30 while still on pressors and IV abx. Vanc level on 5/1 elevated at 43 ug/mL. RRT started on 5/4 w CRRT from 5/4 - 5/7.   Assessment: 1. AKI, non-oliguric - ATN due to shock / vanc toxicity. S/P CRRT 5/4-5/7. Now IHD (access: femoral cath, 5/4) done 5/9 and 5/11 (for azotemia, BUN 144, continues elevated BUN 108). Currently with reduced UOP 435cc yesterday, net >4L positive, BP stable. Hopeful for recovery given previous normal renal function at baseline and non-oliguria. 1. Scheduled for IR placement Tunneled HD Cath today for prolonged access for IHD 2. Will arrange IHD treatment today/tonight (in 49M), [facility] 2. Vol excess moderate diffuse edema - volume removal previously difficult  due to low BP's (previous HD 5/9 removed 2L, 5/11 removed 1.8L) 3. Shock / fevers - all cx's negative, s/p 2 wks abx and off pressors. BP's stabilized, 120-130s. 4. S/P R rotator cuff repair 4/22 5. Morbid obesity 6. VDRF- reintubated 4/23, failed extubation 5/8, req reintubation, ENT placed trach 5/11 7. Bilat LE DVT / suspected PE - on IV hep- to transition to coumadin 8. Hyperphos- no treatment yet until PO's 9. Hyperkalemia - K 5.5 today. Correct with IHD today. 10. Anemia- previous polycythremia- hgb now 11.1 11. Dispo - [facility], will continue IHD via tunneled HD cath and [facility] with continued hopes of returned renal function.  Saralyn PilarAlexander Karamalegos, DO [facility], PGY-2 (Currently working with [facility] - Renal Service) 01/17/2015, 8:24 AM   Patient seen and examined, agree with above note with above modifications. Stable- HD was limited yest by femoral cath (decreased BFR) and inability to remove a lot of fluid (low BP) planning for tunneled HD cath today per IR then will need HD [facility]   Annie SableKellie Zenaida Tesar, MD 01/17/2015        Recent Labs Lab 01/15/15 0455 01/16/15 0416 01/17/15 0500  NA 134* 137 140  K 4.5 5.1 5.5*  CL 97* 99* 103  CO2 22 23 24  GLUCOSE 218* 226* 172*  BUN 96* 144* 108*  CREATININE 4.09* 5.23* 4.78*  CALCIUM 8.3* 8.9 8.7*  PHOS 6.7* 10.6* 10.4*    Recent Labs Lab 01/15/15 0455 01/16/15 0416 01/17/15 0500  ALBUMIN 2.8* 2.5* 2.7*    Recent Labs Lab 01/15/15 0455 01/16/15 0416 01/17/15 0500  WBC 16.4* 14.8* 13.0*  HGB 11.9* 11.6* 11.1*  HCT 36.7* 36.1* 34.5*  MCV 95.3 95.0 96.9  PLT 322 334 281   . alteplase  2 mg Intracatheter Once  . antiseptic oral rinse  7 mL Mouth Rinse QID  . aspirin  81 mg Per Tube Daily  . chlorhexidine  15 mL Mouth Rinse BID  . erythromycin ethylsuccinate  400 mg Oral TID  .  famotidine  20 mg Per Tube Daily  . feeding supplement (PRO-STAT SUGAR FREE 64)  60 mL Per Tube QID  . feeding supplement (VITAL HIGH PROTEIN)  1,000 mL Per Tube Q24H  . folic acid  1 mg Per Tube Daily  . insulin aspart  0-20 Units Subcutaneous 6 times per day  . insulin glargine  15 Units Subcutaneous BID  . sodium chloride  10-40 mL Intracatheter Q12H   . sodium chloride Stopped (01/15/15 2336)  . dextrose 5 % and 0.9% NaCl 50 mL/hr at 01/17/15 0800  . fentaNYL infusion INTRAVENOUS 100 mcg/hr (01/17/15 0800)  . heparin 2,150 Units/hr (01/17/15 0800)  . propofol (DIPRIVAN) infusion Stopped (01/17/15 0746)   acetaminophen (TYLENOL) oral liquid 160 mg/5 mL, fentaNYL, heparin, heparin, heparin, heparin, hydrALAZINE, lidocaine (PF), lidocaine-prilocaine, lip balm, midazolam, [DISCONTINUED] ondansetron **OR** ondansetron (ZOFRAN) IV, sodium chloride

## 2015-01-17 NOTE — Progress Notes (Signed)
Occupational Therapy Treatment Patient Details Name: [name] MRN: [id number] DOB: 11-27-1950 Today's Date: 01/17/2015    History of present illness [facility] or elective Rt RCR.  He was intubated 4/23 am for post op hypercarbic resp failure.  US testes show masses - work up to be one by urology; 5/4 dopplers showed bil. LE DVTs; 5/4 [facility].  PMH includes morbid obesity, arthritis    OT comments  Tolerating RUE elbow/wrist/hand PROM. Skin @ elbow looks dry. Repositioned. Will attempt to adapt shoulder strap to accommodate trach. Will continue to follow  Follow Up Recommendations  [facility]    Equipment Recommendations  Other (comment)    Recommendations for Other Services      Precautions / Restrictions Precautions Precautions: Fall;Shoulder;Other (comment) Type of Shoulder Precautions: Abduction sling in place at all ties.  PROM Rt elbow, wrist, and hand daily  Shoulder Interventions: Shoulder abduction pillow Precaution Comments: shoulder, abductor sling on RUE, except for PROM to elbow and wrist; then removed for this per MD Required Braces or Orthoses: Other Brace/Splint Restrictions RUE Weight Bearing: Non weight bearing       Mobility Bed Mobility      unable to assess            Transfers      not assessed                Balance                                   ADL                                         General ADL Comments: Pt with trach 5/10. Trach oozing bloody drainage. Unable to attach shoulder strap at this time. Pt sedated and not moving at this time. Nursing states pt scheduled to get HD cath placed tomorrow and [facility] Will assess strapping options further. Waist strap secure. pillow behind R shoulder and supporting elbow.      Vision  eyes closed entire session                   Perception     Praxis      Cognition   Behavior  During Therapy: Flat affect (on fentenyl) Overall Cognitive Status: Difficult to assess  Pt not responding.Not following commands. ?medication                       Extremity/Trunk Assessment               Exercises General Exercises - Upper Extremity Elbow Flexion: PROM;Right;Supine;15 reps Elbow Extension: PROM;Right;Supine;15 reps Wrist Flexion: PROM;Right;Supine;15 reps Wrist Extension: PROM;Right;Supine;15 reps Digit Composite Flexion: PROM;Right;Supine;15 reps Composite Extension: PROM;Right;Supine;15 reps   Shoulder Instructions  R UE repositioned on pillows for support     General Comments      Pertinent Vitals/ Pain       Pain Assessment: Faces Faces Pain Scale: No hurt; vitals stable; on CRRT  Home Living  Prior Functioning/Environment              Frequency Min 6X/week     Progress Toward Goals  OT Goals([place] goals can now be found in the care plan section)  Progress towards OT goals: Progressing toward goals  Acute Rehab OT Goals Patient Stated Goal: unable to state OT Goal Formulation: Patient unable to participate in goal setting Time For Goal Achievement: 01/23/15 Potential to Achieve Goals: Good ADL Goals Additional ADL Goal #1: pt will tolerate sling without skin compromise, as noted during PROM to elbow and wrist and RUE ADLs  Plan Discharge plan remains appropriate    Co-evaluation                 End of Session Equipment Utilized During Treatment:  (abduction sling)   Activity Tolerance Patient tolerated treatment well   Patient Left in bed;with call bell/phone within reach;with nursing/sitter in room   Nurse Communication Weight bearing status;Precautions;Other (comment) (plan to adapt shoulder strap)        Time: [id number] OT Time Calculation (min): 13 min  Charges: OT General Charges $OT Visit: 1 Procedure OT Treatments $Therapeutic Activity:  8-22 mins  Makynzi Eastland,HILLARY 01/17/2015, 4:42 PM   [facility] Surya Schroeter, OTR/L  [phone] 01/17/2015

## 2015-01-17 NOTE — Progress Notes (Signed)
01/17/2015 10:30 AM  [place], Fayrene FearingJames [id number]  Post-Op Day 1    Temp:  [98.4 F (36.9 C)-99.4 F (37.4 C)] 98.9 F (37.2 C) (05/12 0400) Pulse Rate:  [75-85] 80 (05/12 1000) Resp:  [12-22] 12 (05/12 1000) BP: (94-146)/(51-84) 138/70 mmHg (05/12 1000) SpO2:  [88 %-97 %] 96 % (05/12 1000) FiO2 (%):  [40 %] 40 % (05/12 0959),     Intake/Output Summary (Last 24 hours) at 01/17/15 1030 Last data filed at 01/17/15 1000  Gross per 24 hour  Intake 2269.56 ml  Output   2265 ml  Net   4.56 ml    Results for orders placed or performed during the hospital encounter of 12/28/14 (from the past 24 hour(s))  Glucose, capillary     Status: Abnormal   Collection Time: 01/16/15 11:24 AM  Result Value Ref Range   Glucose-Capillary 136 (H) 70 - 99 mg/dL  Glucose, capillary     Status: Abnormal   Collection Time: 01/16/15  1:43 PM  Result Value Ref Range   Glucose-Capillary 124 (H) 70 - 99 mg/dL  Triglycerides     Status: Abnormal   Collection Time: 01/16/15  3:46 PM  Result Value Ref Range   Triglycerides 292 (H) <150 mg/dL  Glucose, capillary     Status: Abnormal   Collection Time: 01/16/15  4:44 PM  Result Value Ref Range   Glucose-Capillary 117 (H) 70 - 99 mg/dL  Glucose, capillary     Status: Abnormal   Collection Time: 01/16/15  8:07 PM  Result Value Ref Range   Glucose-Capillary 136 (H) 70 - 99 mg/dL  Glucose, capillary     Status: Abnormal   Collection Time: 01/16/15 11:39 PM  Result Value Ref Range   Glucose-Capillary 151 (H) 65 - 99 mg/dL  Glucose, capillary     Status: Abnormal   Collection Time: 01/17/15  3:52 AM  Result Value Ref Range   Glucose-Capillary 156 (H) 65 - 99 mg/dL  CBC     Status: Abnormal   Collection Time: 01/17/15  5:00 AM  Result Value Ref Range   WBC 13.0 (H) 4.0 - 10.5 K/uL   RBC 3.56 (L) 4.22 - 5.81 MIL/uL   Hemoglobin 11.1 (L) 13.0 - 17.0 g/dL   HCT 40.934.5 (L) 81.139.0 - 91.452.0 %   MCV 96.9 78.0 - 100.0 fL   MCH 31.2 26.0 - 34.0 pg   MCHC 32.2  30.0 - 36.0 g/dL   RDW 78.217.2 (H) 95.611.5 - 21.315.5 %   Platelets 281 150 - 400 K/uL  Renal function panel     Status: Abnormal   Collection Time: 01/17/15  5:00 AM  Result Value Ref Range   Sodium 140 135 - 145 mmol/L   Potassium 5.5 (H) 3.5 - 5.1 mmol/L   Chloride 103 101 - 111 mmol/L   CO2 24 22 - 32 mmol/L   Glucose, Bld 172 (H) 65 - 99 mg/dL   BUN 086108 (H) 6 - 20 mg/dL   Creatinine, Ser 5.784.78 (H) 0.61 - 1.24 mg/dL   Calcium 8.7 (L) 8.9 - 10.3 mg/dL   Phosphorus 46.910.4 (H) 2.5 - 4.6 mg/dL   Albumin 2.7 (L) 3.5 - 5.0 g/dL   GFR calc non Af Amer 12 (L) >60 mL/min   GFR calc Af Amer 14 (L) >60 mL/min   Anion gap 13 5 - 15  Heparin level (unfractionated)     Status: Abnormal   Collection Time: 01/17/15  5:00 AM  Result Value Ref Range  Heparin Unfractionated 0.16 (L) 0.30 - 0.70 IU/mL  Glucose, capillary     Status: Abnormal   Collection Time: 01/17/15  7:32 AM  Result Value Ref Range   Glucose-Capillary 154 (H) 65 - 99 mg/dL    SUBJECTIVE:  Mild bloody soiling but no frank bleeding.  Nurses having difficulty with shoulder sling impinging on trach  OBJECTIVE:  Trach secure and breathing well  IMPRESSION:  Satisfactory check  PLAN:  Routine.  [name], Aliha Diedrich

## 2015-01-17 NOTE — Progress Notes (Addendum)
ANTICOAGULATION CONSULT NOTE - Follow Up Consult  Pharmacy Consult for Heparin  Indication: DVT  No Known Allergies  Patient Measurements: Height: 5\' 11"  (180.3 cm) Weight: (!) 321 lb 14 oz (146 kg) IBW/kg (Calculated) : 75.3  Vital Signs: Temp: 98.2 F (36.8 C) (05/12 1345) Temp Source: Oral (05/12 1345) BP: 160/72 mmHg (05/12 1445) Pulse Rate: 88 (05/12 1445)  Labs:  Recent Labs  01/15/15 0455  01/15/15 2330 01/16/15 0416 01/17/15 0500 01/17/15 1400  HGB 11.9*  --   --  11.6* 11.1*  --   HCT 36.7*  --   --  36.1* 34.5*  --   PLT 322  --   --  334 281  --   HEPARINUNFRC  --   < > 0.25*  --  0.16* 0.34  CREATININE 4.09*  --   --  5.23* 4.78*  --   < > = values in this interval not displayed.  Estimated Creatinine Clearance: 23.8 mL/min (by C-G formula based on Cr of 4.78).   Assessment: 63 YOM on IV heparin for new bilateral femoral DVTs + probable PE. HL this afternoon is therapeutic at 0.34 but on lower end of goal range. H/H and Plt remain stable.  H/H  No bleeding issues noted during rounds. Patient to have tunneled HD catheter placed tomorrow  Goal of Therapy:  Heparin level 0.3-0.7 units/ml Monitor platelets by anticoagulation protocol: Yes   Plan:  -Increase heparin to 2200 units/hr -F/u 8 hr HL  -Daily CBC/HL -Monitor for bleeding  Vinnie LevelBenjamin Mancheril, PharmD., BCPS Clinical Pharmacist Pager [phone]  Addum:  RN called stating patient bleeding from trach site.  Will decrease heparin drip to 1950 units/hr and recheck HL in 6 hours  Talbert CageLora Nicholous Girgenti, PharmD

## 2015-01-17 NOTE — Progress Notes (Signed)
PCCM INTERVAL PROGRESS NOTE  Called to bedside by staff RN for bleeding from trach site. Upon my arrival thrombi-pad had been placed by RN and dressing applied appeared to be CDI, however there was evidence of moderate bleeding on sheets and patient gown. Significant clot around trach site.   -Will hold heparin for tonight and continue to monitor -May need to call ENT if worsens  Joneen Roachaul Okechukwu Regnier, AGACNP-BC [facility], [place] [facility]/Critical [facility] Pager [phone] or [phone]  01/17/2015 10:30 PM

## 2015-01-17 NOTE — Progress Notes (Signed)
ANTICOAGULATION CONSULT NOTE - Follow Up Consult  Pharmacy Consult for Heparin  Indication: DVT  No Known Allergies  Patient Measurements: Height: 5\' 11"  (180.3 cm) Weight:  (348 lbs +rectal pouch 5 pillows shoulder brace urinary cath) IBW/kg (Calculated) : 75.3  Vital Signs: Temp: 98.9 F (37.2 C) (05/12 0400) Temp Source: Oral (05/12 0400) BP: 146/69 mmHg (05/12 0500) Pulse Rate: 80 (05/12 0500)  Labs:  Recent Labs  01/15/15 0455 01/15/15 1425 01/15/15 2330 01/16/15 0416 01/17/15 0500  HGB 11.9*  --   --  11.6* 11.1*  HCT 36.7*  --   --  36.1* 34.5*  PLT 322  --   --  334 281  HEPARINUNFRC  --  0.28* 0.25*  --  0.16*  CREATININE 4.09*  --   --  5.23* 4.78*    Estimated Creatinine Clearance: 25.1 mL/min (by C-G formula based on Cr of 4.78).   Assessment: Sub-therapeutic heparin level x 1 after re-start s/p trach placement on 5/11. No issues per RN.   Goal of Therapy:  Heparin level 0.3-0.7 units/ml Monitor platelets by anticoagulation protocol: Yes   Plan:  -Increase heparin to 2150 units/hr -1400 HL -Daily CBC/HL -Monitor for bleeding  Abran DukeLedford, Gurkirat 01/17/2015,6:02 AM

## 2015-01-17 NOTE — Care Management Note (Signed)
Case Management Note  Patient Details  Name: [name] MRN: [id number] Date of Birth: 06-19-1951  Subjective/Objective:      Pt admitted s/p rotator cuff repair with multiple post op complications- now s/p trach on 01/16/15, and will need continued HD tx plan for HD cath 01/17/15              Action/Plan:   Expected Discharge Date:                  Expected Discharge Plan:  Home/Self Care  In-House Referral:     Discharge planning Services  CM Consult  Post Acute Care Choice:  Long Term Acute Care (LTAC) Choice offered to:     DME Arranged:    DME Agency:     HH Arranged:    HH Agency:     Status of Service:     Medicare Important Message Given:    Date Medicare IM Given:    Medicare IM give by:    Date Additional Medicare IM Given:    Additional Medicare Important Message give by:     If discussed at Long Length of Stay Meetings, dates discussed:  01/17/15  Additional Comments: Pt has RN from [employer]) named Venida JarvisPatty Harper. Her number is [phone]. Their fax number is [phone]. [email or web address]   Workers comp claim #- [phone]- insurance- [employer]- fax#-[phone]- adjusterTyson Dense- Nicole Deloia-- [phone]  01/17/15- 1030- Spoke with Patty this am regarding LTACH option for pt and was told by Patty that [facility] and that she and Joni Reiningicole had discussed [facility] and [facility]- so [facility] to [facility] when medically stable- hopeful for Friday of this week after HD cath placed in IR today-  Notified Aiesha with [facility] of Workers Comp approval- [facility] working on bed for pt- also notified Sue Lushndrea with [facility] of decision. Call made to pt's brother Chrissie NoaWilliam Hlavacek to discuss LTACH option and plans - informed brother that workers comp [facility]'s [facility] process- and that they had chosen  [facility] as the [facility] of choice- pt's brother voices understanding and is agreeable to the plan to move pt to [facility] when MD determines pt to be medically stable possibly tomorrow-- [place] form place on shadow chart   01/16/15-  1100- [facility]- calls made to both [facility] and [facility] - pt may be ready for discharge at the end of the week -   Zenda AlpersWebster, Lenn SinkKristi Hall, RN 01/17/2015, 12:07 PM

## 2015-01-18 ENCOUNTER — Inpatient Hospital Stay (HOSPITAL_COMMUNITY): Payer: Worker's Compensation | Admitting: Anesthesiology

## 2015-01-18 ENCOUNTER — Inpatient Hospital Stay (HOSPITAL_COMMUNITY): Payer: Worker's Compensation

## 2015-01-18 ENCOUNTER — Encounter (HOSPITAL_COMMUNITY)
Admission: RE | Disposition: A | Discharge status: Nursing Home (Includes ICF) | Payer: Self-pay | Source: Ambulatory Visit | Attending: Pulmonary Disease

## 2015-01-18 DIAGNOSIS — R58 Hemorrhage, not elsewhere classified: Secondary | ICD-10-CM

## 2015-01-18 HISTORY — PX: TRACHEOSTOMY REVISION: SHX6133

## 2015-01-18 LAB — RENAL FUNCTION PANEL
ANION GAP: 15 (ref 5–15)
Albumin: 2.8 g/dL — ABNORMAL LOW (ref 3.5–5.0)
BUN: 105 mg/dL — ABNORMAL HIGH (ref 6–20)
CALCIUM: 9.1 mg/dL (ref 8.9–10.3)
CHLORIDE: 99 mmol/L — AB (ref 101–111)
CO2: 27 mmol/L (ref 22–32)
CREATININE: 4.9 mg/dL — AB (ref 0.61–1.24)
GFR calc non Af Amer: 12 mL/min — ABNORMAL LOW (ref >60)
GFR, EST AFRICAN AMERICAN: 13 mL/min — AB (ref >60)
GLUCOSE: 168 mg/dL — AB (ref 65–99)
Phosphorus: 11.7 mg/dL — ABNORMAL HIGH (ref 2.5–4.6)
Potassium: 5.5 mmol/L — ABNORMAL HIGH (ref 3.5–5.1)
Sodium: 141 mmol/L (ref 135–145)

## 2015-01-18 LAB — CBC
HCT: 35.9 % — ABNORMAL LOW (ref 39.0–52.0)
Hemoglobin: 11.4 g/dL — ABNORMAL LOW (ref 13.0–17.0)
MCH: 30.9 pg (ref 26.0–34.0)
MCHC: 31.8 g/dL (ref 30.0–36.0)
MCV: 97.3 fL (ref 78.0–100.0)
Platelets: 263 10*3/uL (ref 150–400)
RBC: 3.69 MIL/uL — AB (ref 4.22–5.81)
RDW: 17 % — ABNORMAL HIGH (ref 11.5–15.5)
WBC: 12.5 10*3/uL — ABNORMAL HIGH (ref 4.0–10.5)

## 2015-01-18 LAB — GLUCOSE, CAPILLARY
GLUCOSE-CAPILLARY: 188 mg/dL — AB (ref 65–99)
GLUCOSE-CAPILLARY: 130 mg/dL — AB (ref 65–99)
Glucose-Capillary: 134 mg/dL — ABNORMAL HIGH (ref 65–99)
Glucose-Capillary: 137 mg/dL — ABNORMAL HIGH (ref 65–99)
Glucose-Capillary: 138 mg/dL — ABNORMAL HIGH (ref 65–99)
Glucose-Capillary: 154 mg/dL — ABNORMAL HIGH (ref 65–99)

## 2015-01-18 LAB — HEPARIN LEVEL (UNFRACTIONATED): Heparin Unfractionated: <0.1 IU/mL — ABNORMAL LOW (ref 0.30–0.70)

## 2015-01-18 SURGERY — REVISION, STOMA, TRACHEA
Anesthesia: General | Site: Neck

## 2015-01-18 MED ORDER — SODIUM CHLORIDE 0.45 % IV SOLN
INTRAVENOUS | Status: DC | PRN
Start: 2015-01-18 — End: 2015-01-18

## 2015-01-18 MED ORDER — ROCURONIUM BROMIDE 50 MG/5ML IV SOLN
INTRAVENOUS | Status: AC
  Filled 2015-01-18: qty 1

## 2015-01-18 MED ORDER — LIDOCAINE HCL (CARDIAC) 20 MG/ML IV SOLN
INTRAVENOUS | Status: AC
  Filled 2015-01-18: qty 5

## 2015-01-18 MED ORDER — LIDOCAINE HCL (PF) 1 % IJ SOLN
INTRAMUSCULAR | Status: AC
  Filled 2015-01-18: qty 30

## 2015-01-18 MED ORDER — FENTANYL CITRATE (PF) 250 MCG/5ML IJ SOLN
INTRAMUSCULAR | Status: DC | PRN
  Administered 2015-01-18: 100 ug via INTRAVENOUS

## 2015-01-18 MED ORDER — CEFAZOLIN SODIUM-DEXTROSE 2-3 GM-% IV SOLR
INTRAVENOUS | Status: AC
  Administered 2015-01-18: 2 g via INTRAVENOUS
  Filled 2015-01-18: qty 50

## 2015-01-18 MED ORDER — MIDAZOLAM HCL 2 MG/2ML IJ SOLN
INTRAMUSCULAR | Status: DC | PRN
  Administered 2015-01-18: 2 mg via INTRAVENOUS

## 2015-01-18 MED ORDER — ALTEPLASE 100 MG IV SOLR
6.0000 mg | Freq: Once | INTRAVENOUS | Status: AC
  Administered 2015-01-19: 6 mg
  Filled 2015-01-18: qty 6

## 2015-01-18 MED ORDER — MEPERIDINE HCL 25 MG/ML IJ SOLN: 6.2500 mg | INTRAMUSCULAR | Status: DC | PRN

## 2015-01-18 MED ORDER — 0.9 % SODIUM CHLORIDE (POUR BTL) OPTIME
TOPICAL | Status: DC | PRN
  Administered 2015-01-18: 1000 mL

## 2015-01-18 MED ORDER — DEXTROSE 5 % IV SOLN
3.0000 g | INTRAVENOUS | Status: DC
  Filled 2015-01-18: qty 3000

## 2015-01-18 MED ORDER — MIDAZOLAM HCL 2 MG/2ML IJ SOLN
INTRAMUSCULAR | Status: AC
  Filled 2015-01-18: qty 2

## 2015-01-18 MED ORDER — ONDANSETRON HCL 4 MG/2ML IJ SOLN: 4.0000 mg | Freq: Once | INTRAMUSCULAR | Status: DC | PRN

## 2015-01-18 MED ORDER — CEFAZOLIN SODIUM-DEXTROSE 2-3 GM-% IV SOLR
2.0000 g | INTRAVENOUS | Status: AC
  Administered 2015-01-18: 2 g via INTRAVENOUS

## 2015-01-18 MED ORDER — VITAL HIGH PROTEIN PO LIQD
1000.0000 mL | ORAL | Status: DC
  Administered 2015-01-18 – 2015-01-19 (×2): 1000 mL
  Filled 2015-01-18 (×3): qty 1000

## 2015-01-18 MED ORDER — HYDROMORPHONE HCL 1 MG/ML IJ SOLN: 0.2500 mg | INTRAMUSCULAR | Status: DC | PRN

## 2015-01-18 MED ORDER — ROCURONIUM BROMIDE 100 MG/10ML IV SOLN
INTRAVENOUS | Status: DC | PRN
  Administered 2015-01-18: 50 mg via INTRAVENOUS
  Administered 2015-01-18: 30 mg via INTRAVENOUS

## 2015-01-18 MED ORDER — DEXTROSE-NACL 5-0.9 % IV SOLN
INTRAVENOUS | Status: DC | PRN
  Administered 2015-01-18: 02:00:00 via INTRAVENOUS

## 2015-01-18 MED ORDER — HEPARIN SODIUM (PORCINE) 1000 UNIT/ML IJ SOLN
INTRAMUSCULAR | Status: AC
  Filled 2015-01-18: qty 1

## 2015-01-18 MED ORDER — HEMOSTATIC AGENTS (NO CHARGE) OPTIME
TOPICAL | Status: DC | PRN
  Administered 2015-01-18: 1 via TOPICAL

## 2015-01-18 MED ORDER — FENTANYL CITRATE (PF) 250 MCG/5ML IJ SOLN
INTRAMUSCULAR | Status: AC
  Filled 2015-01-18: qty 5

## 2015-01-18 SURGICAL SUPPLY — 46 items
BLADE SURG 15 STRL LF DISP TIS (BLADE) IMPLANT
BLADE SURG 15 STRL SS (BLADE)
BLADE SURG ROTATE 9660 (MISCELLANEOUS) IMPLANT
CANISTER SUCTION 2500CC (MISCELLANEOUS) ×3 IMPLANT
CLEANER TIP ELECTROSURG 2X2 (MISCELLANEOUS) ×3 IMPLANT
COVER SURGICAL LIGHT HANDLE (MISCELLANEOUS) ×3 IMPLANT
DECANTER SPIKE VIAL GLASS SM (MISCELLANEOUS) ×3 IMPLANT
DRAPE PROXIMA HALF (DRAPES) IMPLANT
ELECT COATED BLADE 2.86 ST (ELECTRODE) ×3 IMPLANT
ELECT REM PT RETURN 9FT ADLT (ELECTROSURGICAL) ×3
ELECTRODE REM PT RTRN 9FT ADLT (ELECTROSURGICAL) ×1 IMPLANT
GAUZE SPONGE 4X4 16PLY XRAY LF (GAUZE/BANDAGES/DRESSINGS) ×3 IMPLANT
GAUZE XEROFORM 5X9 LF (GAUZE/BANDAGES/DRESSINGS) IMPLANT
GLOVE BIO SURGEON STRL SZ7 (GLOVE) ×3 IMPLANT
GLOVE BIO SURGEON STRL SZ7.5 (GLOVE) ×3 IMPLANT
GLOVE BIOGEL PI IND STRL 7.5 (GLOVE) ×1 IMPLANT
GLOVE BIOGEL PI INDICATOR 7.5 (GLOVE) ×2
GLOVE SURG SS PI 7.5 STRL IVOR (GLOVE) ×6 IMPLANT
GOWN STRL REUS W/ TWL LRG LVL3 (GOWN DISPOSABLE) ×3 IMPLANT
GOWN STRL REUS W/TWL LRG LVL3 (GOWN DISPOSABLE) ×6
HOLDER TRACH TUBE VELCRO 19.5 (MISCELLANEOUS) ×3 IMPLANT
HOVERMATT SINGLE USE (MISCELLANEOUS) ×3 IMPLANT
KIT BASIN OR (CUSTOM PROCEDURE TRAY) ×3 IMPLANT
KIT ROOM TURNOVER OR (KITS) ×3 IMPLANT
KIT SUCTION CATH 14FR (SUCTIONS) IMPLANT
NEEDLE HYPO 25GX1X1/2 BEV (NEEDLE) ×3 IMPLANT
NS IRRIG 1000ML POUR BTL (IV SOLUTION) ×3 IMPLANT
PACK EENT II TURBAN DRAPE (CUSTOM PROCEDURE TRAY) ×3 IMPLANT
PAD ARMBOARD 7.5X6 YLW CONV (MISCELLANEOUS) ×6 IMPLANT
PENCIL BUTTON HOLSTER BLD 10FT (ELECTRODE) ×3 IMPLANT
SPONGE DRAIN TRACH 4X4 STRL 2S (GAUZE/BANDAGES/DRESSINGS) ×3 IMPLANT
SPONGE INTESTINAL PEANUT (DISPOSABLE) IMPLANT
SUT CHROMIC 2 0 SH (SUTURE) ×3 IMPLANT
SUT ETHILON 2 0 FS 18 (SUTURE) ×6 IMPLANT
SUT SILK 2 0 FS (SUTURE) ×3 IMPLANT
SUT SILK 3 0 REEL (SUTURE) ×3 IMPLANT
SUT VIC AB 2-0 CTX 27 (SUTURE) ×3 IMPLANT
SUT VIC AB 3-0 PS2 18 (SUTURE) ×2
SUT VIC AB 3-0 PS2 18XBRD (SUTURE) ×1 IMPLANT
SYR 20CC LL (SYRINGE) ×3 IMPLANT
SYR BULB IRRIGATION 50ML (SYRINGE) IMPLANT
SYR CONTROL 10ML LL (SYRINGE) ×3 IMPLANT
TOWEL OR 17X24 6PK STRL BLUE (TOWEL DISPOSABLE) ×3 IMPLANT
TUBE CONNECTING 12'X1/4 (SUCTIONS) ×1
TUBE CONNECTING 12X1/4 (SUCTIONS) ×2 IMPLANT
WATER STERILE IRR 1000ML POUR (IV SOLUTION) ×3 IMPLANT

## 2015-01-18 NOTE — Progress Notes (Signed)
eLink Physician-Brief Progress Note Patient Name: [name] DOB: [birth year] MRN: [id number]   Date of Service  01/18/2015  HPI/Events of Note  Call from bedside APP - [name] - sreports that patient having trach bleed despite dc heparin > 1h ago and thrombipad  On camera - hard to see due to patient obesity  eICU Interventions  Advised him to call ENT     Intervention Category Major Interventions: Other:  Sierra Spargo 01/18/2015, 1:10 AM

## 2015-01-18 NOTE — Progress Notes (Signed)
Pt returned from OR and placed back on vent for rest.

## 2015-01-18 NOTE — Significant Event (Addendum)
1030am-RN received a verbal order from Dr. Sung AmabileSimonds during rounds for restart of TF @ 10cc/hour. Spoke with MD regarding high residuals from NG (IWS), with 1550cc overnight and 300cc so far in a four hour period. MD gave order for trickle feeds @ 10cc/hour. Also, address dark tea colored urine and large amount of sediments. MD gave verbal order to change foley catheter. RN will change foley catheter after procedures are done today.

## 2015-01-18 NOTE — Transfer of Care (Signed)
Immediate Anesthesia Transfer of Care Note  Patient: [name]  Procedure(s) Performed: Procedure(s): TRACHEOSTOMY REVISION (N/A)  Patient Location: ICU  Anesthesia Type:General  Level of Consciousness: Patient remains intubated per anesthesia plan  Airway & Oxygen Therapy: Patient remains intubated per anesthesia plan and Patient placed on Ventilator (see vital sign flow sheet for setting)  Post-op Assessment: Report given to RN  Post vital signs: Reviewed and stable  Last Vitals:  Filed Vitals:   01/18/15 0100  BP: 152/87  Pulse: 99  Temp:   Resp: 18    Complications: No apparent anesthesia complications

## 2015-01-18 NOTE — H&P (Signed)
01/18/2015  1:12 AM  [name], [name]  PREOPERATIVE HISTORY AND PHYSICAL/CONSULT NOTE  CHIEF COMPLAINT: bleeding from tracheotomy  HISTORY: This is a 63yowith bilateral LE DVTs on heparin drip s/p tracheotomy by Dr. Lazarus SalinesWolicki 01/16/15 who has been having bleeding around teh tracheostomy site for about 12 hours today who now presents with intractable tracheostomy site bleeding. ENT was consulted for this and he now presents for control of tracheostomy hemorrhage.  Dr. Emeline DarlingGore, Clovis RileyMitchell has discussed the risks, benefits, and alternatives of this procedure with the patient's brother. The patient's brother understands the risks and would like us to proceed with the emergency procedure. The chances of success of the procedure are >25% and the patient understands this. I personally performed an examination of the patient within 24 hours of the procedure.  PAST MEDICAL HISTORY: Past Medical History  Diagnosis Date  . Arthritis   . RCT (rotator cuff tear)   . Multiple abrasions     rt arm  . Morbid obesity     PAST SURGICAL HISTORY: Past Surgical History  Procedure Laterality Date  . No past surgeries    . Shoulder arthroscopy with subacromial decompression, rotator cuff repair and bicep tendon repair Right 12/28/2014    Procedure: RIGHT SHOULDER ARTHROSCOPY WITH SUBACROMIAL DECOMPRESSION, DISTAL CLAVICLE RESECTION, ROTATOR CUFF REPAIR ;  Surgeon: Eugenia Mcalpineobert Collins, MD;  Location: [facility];  Service: Orthopedics;  Laterality: Right;  . Tracheostomy tube placement N/A 01/16/2015    Procedure: TRACHEOSTOMY;  Surgeon: Flo ShanksKarol Wolicki, MD;  [facility];  Service: ENT;  Laterality: N/A;    MEDICATIONS: Scheduled Meds: . alteplase  2 mg Intracatheter Once  . antiseptic oral rinse  7 mL Mouth Rinse QID  . aspirin  81 mg Per Tube Daily  . chlorhexidine  15 mL Mouth Rinse BID  . erythromycin ethylsuccinate  400 mg Per Tube TID  . famotidine  20 mg Per Tube Daily  . feeding supplement (PRO-STAT SUGAR FREE  64)  60 mL Per Tube QID  . feeding supplement (VITAL HIGH PROTEIN)  1,000 mL Per Tube Q24H  . folic acid  1 mg Per Tube Daily  . insulin aspart  0-20 Units Subcutaneous 6 times per day  . insulin glargine  15 Units Subcutaneous BID  . sodium chloride  10-40 mL Intracatheter Q12H   Continuous Infusions: . sodium chloride Stopped (01/15/15 2336)  . dextrose 5 % and 0.9% NaCl 50 mL/hr at 01/17/15 1804  . heparin Stopped (01/17/15 2210)   PRN Meds:.acetaminophen (TYLENOL) oral liquid 160 mg/5 mL, fentaNYL (SUBLIMAZE) injection, heparin, heparin, heparin, heparin, hydrALAZINE, lidocaine (PF), lidocaine-prilocaine, lip balm, LORazepam, [DISCONTINUED] ondansetron **OR** ondansetron (ZOFRAN) IV, sodium chloride  ALLERGIES: No Known Allergies    SOCIAL HISTORY: History   Social History  . Marital Status: Single    Spouse Name: N/A  . Number of Children: N/A  . Years of Education: N/A   Occupational History  . Not on file.   Social History Main Topics  . Smoking status: [place] Every Day Smoker  . Smokeless tobacco: Not on file  . Alcohol Use: No  . Drug Use: No  . Sexual Activity: Not on file   Other Topics Concern  . Not on file   Social History Narrative    FAMILY HISTORY:  Family History  Problem Relation Age of Onset  . Heart disease Mother   . Heart attack Mother   . Heart disease Brother     REVIEW OF SYSTEMS:  HEENT: unable to obtain x  12 systems (patient trached and not oriented)  PHYSICAL EXAM:  GENERAL:  Not oriented, not responsive VITAL SIGNS:   Filed Vitals:   01/18/15 0100  BP: 152/87  Pulse: 99  Temp:   Resp: 18   HEENT: tracheostomy in place with trach collar oxygen and copious blood oozing/blood clots from all around tracheostomy. NECK:  See above ABDOMEN:  Obese but soft PSYCH:  Unable to assess NEUROLOGIC:  Unable to assess   ASSESSMENT AND PLAN: Plan to proceed with emergency tracheostomy exploration and control of hemorrhage. Consent  obtained from patient's brother and is on chart and signed/witnessed. 01/18/2015  1:12 AM [name], [name]

## 2015-01-18 NOTE — Progress Notes (Signed)
Patient ID: [name], male   DOB: 08/06/1951, 64 y.o.   MRN: [id number]    Pt dx with extensive B LE DVT On Heparin infusion  Developed bleeding around trach site and Hep stopped Now request for Inferior vena cava filter placement  Consent obtained via phone from brother Chrissie NoaWilliam Consent in IR

## 2015-01-18 NOTE — Progress Notes (Addendum)
PULMONARY / CRITICAL CARE MEDICINE   Name: [name] MRN: [id number] DOB: 1951-01-30    ADMISSION DATE:  12/28/2014 CONSULTATION DATE:  12/28/14   REFERRING MD :  Imogene BurnJenkins/TRH  PT PROFILE:  [id number] morbidly obese male admitted 4/22 by Ortho for elective R rotator cuff tear. Intubated early 4/23 AM for post operative hypercarbic/hypoxic resp failure.  MAJOR EVENTS/TEST RESULTS: 4/22  elective R shoulder surgery (Dr Thomasena Edisollins) [facility] 4/23  reintubated for hypercarbic/hypoxic resp failure 4/23  US scrotum: Two solid extratesticular right scrotal masses. Primary differential diagnosis for extratesticular masses include primary or metastatic neoplasms including adenomatoid tumor, fibroma, leiomyoma, mesothelioma, or sarcoma. Urology consultation is recommended  4/26 TEE:  LVEF 60-65%, Grade 1 DD, trivial pericardial effusion 4/27 US testes with masses 5/02 renal US:  no hydronephrosis, Possible nonobstructing stone lower pole left kidney 5/02 initial ENT consult. Trach tube planned but pt deemed too critically ill @ the time to proceed 5/03 Renal consult for AKI 5/04 LE venous duplex:  Extensive B DVT present [facility] 5/06 Off pressors. Transitioned off CRRT to intermittent HD 5/08 Failed extubation attempt 5/09 ENT re- consult for trach tube placement 5/10 Tolerated PS 10 cm H2O. Seen by ENT. Trach tube placement planned 5/11 5/11 Trach tube placement [facility](Wolicki) 5/12 Tunneled HD cath planned  (IR). Tolerating ATC. Continuous sedation discontinued.  5/13 To OR ([name]) for excessive trach site bleeding. Underwent neck exploration and washout and control of bleeding. Heparin held 5/13 IVC filter placement requested of IR. Plan to perform it at same time as tunneled HD catheter placement  DEVICES/LINES/TUBES: ETT 4/23 >> 5/08, 5/08 >> 5/11 Trach tube 5/11 >>  RUE PICC 4/23 >>  L femoral HD cath 5/04 >>   MICRO: MRSA PCR 4/22 >> NEG Urine  4.23 >> NEG resp 4/23 >> NEG Urine 4/23 >> NEG Resp 4/27 >> NEG Blood 4/27 >> NEG Resp 5/02 >> NEG  ANTIBIOTICS: Cefazolin 4/22 >> 4/23 Levofloxacin 4/23 >> 4/27 Cefepime 4/27 >> 5/05 Vanc 4/27 >> 5/01   SUBJECTIVE:   RASS -2. Not F/C presently. Increased FiO2 reqt's. Therefore weaning  VITAL SIGNS: Temp:  [98.3 F (36.8 C)-100.1 F (37.8 C)] 99.3 F (37.4 C) (05/13 1144) Pulse Rate:  [80-100] 97 (05/13 1358) Resp:  [13-26] 19 (05/13 1358) BP: (102-178)/(53-95) 146/86 mmHg (05/13 1358) SpO2:  [90 %-100 %] 97 % (05/13 1358) FiO2 (%):  [40 %-100 %] 100 % (05/13 1338) Weight:  [143.2 kg (315 lb 11.2 oz)] 143.2 kg (315 lb 11.2 oz) (05/12 1745)   VENTILATOR SETTINGS: Vent Mode:  [-] PRVC FiO2 (%):  [40 %-100 %] 100 % Set Rate:  [16 bmp] 16 bmp Vt Set:  [600 mL] 600 mL PEEP:  [5 cmH20] 5 cmH20 Plateau Pressure:  [16 cmH20] 16 cmH20   INTAKE / OUTPUT:  Intake/Output Summary (Last 24 hours) at 01/18/15 1405 Last data filed at 01/18/15 1300  Gross per 24 hour  Intake 1686.08 ml  Output   6812 ml  Net -5125.92 ml    PHYSICAL EXAMINATION: General: obese, RASS -2, Not F/C Neuro: no focal deficits HEENT: NCAT, thick, short neck, moderate bleeding from trach tube site Cardiovascular: regular, no M Lungs: Clear anteriorly, no wheeze Abdomen: soft, non tender Ext: R shoulder in sling, 1+ symmetric edema  LABS:  CBC  Recent Labs Lab 01/16/15 0416 01/17/15 0500 01/18/15 0455  WBC 14.8* 13.0* 12.5*  HGB 11.6* 11.1* 11.4*  HCT 36.1*  34.5* 35.9*  PLT 334 281 263   Coag's No results for input(s): APTT, INR in the last 168 hours.   BMET  Recent Labs Lab 01/16/15 0416 01/17/15 0500 01/18/15 0455  NA 137 140 141  K 5.1 5.5* 5.5*  CL 99* 103 99*  CO2 23 24 27   BUN 144* 108* 105*  CREATININE 5.23* 4.78* 4.90*  GLUCOSE 226* 172* 168*   Electrolytes  Recent Labs Lab 01/12/15 0440  01/16/15 0416 01/17/15 0500 01/18/15 0455  CALCIUM  --   < > 8.9 8.7*  9.1  MG 2.9*  --   --   --   --   PHOS  --   < > 10.6* 10.4* 11.7*  < > = values in this interval not displayed.   Liver Enzymes  Recent Labs Lab 01/16/15 0416 01/17/15 0500 01/18/15 0455  ALBUMIN 2.5* 2.7* 2.8*   Cardiac Enzymes No results for input(s): TROPONINI, PROBNP in the last 168 hours. Glucose  Recent Labs Lab 01/17/15 1539 01/17/15 1952 01/18/15 0012 01/18/15 0330 01/18/15 0756 01/18/15 1136  GLUCAP 134* 121* 138* 188* 134* 130*    CXR: NNF    ASSESSMENT / PLAN:  PULMONARY A:  Acute on chronic hypoxic/hypercapnic respiratory Probable OSA/OHS Tobacco abuse P:   Cont vent support - settings reviewed and/or adjusted Work in PSV > ATC as tolerated Cont vent bundle  CARDIOVASCULAR A:  Hx of HTN  H/O chronic diastolic heart failure Minimally elevated troponin (0.48 5/01)   - suspected demand ischemia  - No further eval planned Hx of HLD VT episode 4/26 Extensive BLE DVT P:  Holding ASA Holding heparin - discussed with Dr Lazarus SalinesWolicki. HE would be comfortable with resumption of anticoagulation early to mid part of next week if no more bleeding IVC filter placement requested of IR  RENAL A:   AKI 2nd to ATN Scrotal mass - AFP and beta HCG normal P:   Monitor BMET intermittently Monitor I/Os Correct electrolytes as indicated HD per Renal Tunneled HD cath planned 5/13 When recovers sufficiently from [place] illness, consider Urology eval for scrotal mass  GASTROINTESTINAL A:  High GRVs - recurrent Constipation, resolved P:   SUP: enteral famotidine Resume TFs after procedures today  Cont EES as motility agent (started 5/09)  HEMATOLOGIC A:   Polycythemia - likely chronic hypoxemia. Resolved ICU acquired anemia without acute blood loss P:  DVT px: Heparin on hold. IVC filter to be placed Monitor CBC intermittently Transfuse per usual ICU guidelines Transition to warfarin when feasible - likely needs lifelong given extent of  clot  INFECTIOUS A:   Possible HCAP, treated P:  Monitor temp, WBC count Micro and abx as above Cont to monitor off Abx  ENDOCRINE A:   Hyperglycemia without prior documentation of DM P:   Cont Lantus - increased 5/09 Cont SSI  NEUROLOGIC A:  Acute encephalopathy Post op pain ICU associated discomfort/agitation Severe deconditioning P:   RASS goal 0, -1 Cont PRN fent, PRN lorazepam  ORTHO A: Rt shoulder surgery 4/22 P: Post op mgmt per ortho I have asked ortho to make recs for further instrcutions [facility]  DISPOSITION: After procedures today and if no further bleeding problems, [facility]. [facility] Needs weaning from vent, continued HD, physical rehab inc rehab of R shoulder after rotator cuff repair, resumption of anticoagulation early to mid part of next week     45 mins CCM  time addressing ongoing problems and new problem of trach site bleeding in setting DVT. Discussed with Dr Lazarus Salines and Dr Thomasena Edis   Billy Fischer, MD ; [facility] [phone].  After 5:30 PM or weekends, call [phone]

## 2015-01-18 NOTE — Progress Notes (Signed)
Pt transported on vent to/from IR on vent, 100% fio2 w/ no apparent complications.

## 2015-01-18 NOTE — Progress Notes (Signed)
Occupational Therapy Treatment Patient Details Name: [name] MRN: [id number] DOB: 1951-01-17 Today's Date: 01/18/2015    History of present illness [facility] or elective Rt [facility].  He was intubated 4/23 am for post op hypercarbic resp failure.  US testes show masses - work up to be one by urology; 5/4 dopplers showed bil. LE DVTs; 5[facility]4 [facility].  PMH includes morbid obesity, arthritis . 5/11 trach. 5/12 neck wasout secondary to trach hemmorrage. 5/13 tunneled HD cath placement.and IVC filter placement.   OT comments  Received new order to begin R shoulder PROM. Sling removed and completed RUE PROM. Sling repositioned and assisted nursing with positioning of pillows. Neck strap adjusted to prevent pressure over trach site. Also  Used blanket rolls to support B feet and prevent foot drop. Will continue to follow.   Follow Up Recommendations  LTACH    Equipment Recommendations  Other (comment)    Recommendations for Other Services      Precautions / Restrictions Precautions Precautions: Fall;Shoulder;Other (comment) Type of Shoulder Precautions: donjoy abduction sling. PROM elbow and shoulder Shoulder Interventions: Shoulder abduction pillow Restrictions RUE Weight Bearing: Non weight bearing       Mobility Bed Mobility Overal bed mobility: +2 for physical assistance             General bed mobility comments: total Assist  Transfers                      Balance                                   ADL                                         General ADL Comments: total care                                      Cognition     sedated/ on vent                          Extremity/Trunk Assessment   Completed BUE PROM. positioning BUE . L hand elevated due to dependent edema            Exercises General Exercises - Upper Extremity Shoulder Flexion:  PROM;Right;5 reps Shoulder ABduction: PROM;Right;5 reps Shoulder ADduction: PROM;Right;5 reps Shoulder Horizontal ABduction: PROM;Right;5 reps Shoulder Horizontal ADduction: PROM;Right;5 reps Elbow Flexion: PROM;Right;10 reps Elbow Extension: PROM;Right;10 reps Wrist Flexion: PROM;Right;10 reps Wrist Extension: PROM;Right;10 reps Digit Composite Flexion: PROM;Right;10 reps Composite Extension: PROM;Right;10 reps   Shoulder Instructions       General Comments      Pertinent Vitals/ Pain       Pain Assessment: Faces Faces Pain Scale: No hurt. Vitals stable  Home Living                                          Prior Functioning/Environment              Frequency Min 6X/week     Progress Toward Goals  OT Goals(Rini goals can now be found in the care plan section)  Progress towards OT goals: Progressing toward goals (goals upgraded)  Acute Rehab OT Goals Patient Stated Goal: unable to state OT Goal Formulation: Patient unable to participate in goal setting Time For Goal Achievement: 02/01/15 Potential to Achieve Goals: Good ADL Goals Additional ADL Goal #1: pt will tolerate sling without skin compromise, as noted during PROM to elbow and wrist and RUE ADLs Additional ADL Goal #2: Pt will tolerate R shoulder PROM to increase functional use RUE  Plan Discharge plan remains appropriate    Co-evaluation                 End of Session     Activity Tolerance Patient tolerated treatment well   Patient Left in bed;with call bell/phone within reach;with nursing/sitter in room   Nurse Communication Weight bearing status;Precautions;Other (comment)        Time: 1453-1510 OT Time Calculation (min): 17 min  Charges: OT General Charges $OT Visit: 1 Procedure OT Treatments $Therapeutic Activity: 8-22 mins  [name],[name] 01/18/2015, 3:35 PM   [facility] Jordell Outten, OTR/L  [phone] 01/18/2015

## 2015-01-18 NOTE — Anesthesia Postprocedure Evaluation (Signed)
Anesthesia Post Note  Patient: [name]  Procedure(s) Performed: Procedure(s) (LRB): TRACHEOSTOMY REVISION (N/A)  Anesthesia type: General  Patient location: ICU  Post pain: Pain level controlled  Post assessment: Post-op Vital signs reviewed  Last Vitals:  Filed Vitals:   01/18/15 0600  BP: 110/59  Pulse: 86  Temp:   Resp: 19    Post vital signs: stable  Level of consciousness: Patient remains intubated per anesthesia plan  Complications: No apparent anesthesia complications

## 2015-01-18 NOTE — Anesthesia Preprocedure Evaluation (Signed)
Anesthesia Evaluation  Patient identified by MRN, date of birth, ID band Patient awake    Reviewed: Allergy & Precautions, NPO status , Patient's Chart, lab work & pertinent test results  Airway Mallampati: Trach       Dental   Pulmonary Ovando Smoker,  Hypoxemia post Shoulder surgery with Interscalene Block. Needed intubation POD1. Tracheostomy POD2   Pulmonary exam normal       Cardiovascular hypertension, Pt. on medications Normal cardiovascular exam    Neuro/Psych    GI/Hepatic   Endo/Other    Renal/GU ARFRenal disease     Musculoskeletal   Abdominal   Peds  Hematology   Anesthesia Other Findings   Reproductive/Obstetrics                             Anesthesia Physical Anesthesia Plan  ASA: IV  Anesthesia Plan: General   Post-op Pain Management:    Induction: Intravenous  Airway Management Planned: Tracheostomy  Additional Equipment:   Intra-op Plan:   Post-operative Plan: Post-operative intubation/ventilation  Informed Consent: I have reviewed the patients History and Physical, chart, labs and discussed the procedure including the risks, benefits and alternatives for the proposed anesthesia with the patient or authorized representative who has indicated his/her understanding and acceptance.     Plan Discussed with: CRNA and Surgeon  Anesthesia Plan Comments:         Anesthesia Quick Evaluation

## 2015-01-18 NOTE — Procedures (Signed)
IVC filter RIJV HD catheter No comp

## 2015-01-18 NOTE — Op Note (Signed)
01/18/2015 2:57 AM  Wish,  [name] [id number]  Pre-Op Dx: tracheostomy with post-op hemorrhage  Post-Op Dx:  tracheostomy with post-op hemorrhage  Proc:  Neck exploration and washout and control of post-tracheotomy hemorrhage  Surg:  [name], [name]  Anes:  GET/tracheotomy  EBL:  Less than 100mL  Comp:  none  Findings:  Copious blood clot around the 8 cuffed tracheotomy and diffuse subcutaneous and  Peritracheal oozing from tracheostomy site.  Procedure:  The patient was brought from the intensive care unit to the operating room and transferred to an operating table.  Anesthesia was administered per the existing tracheostomy tube which was then switched out for a 6 cuffed endotracheal tube in the tracheostomy.  I removed copious blood clot from around the tracheostomy and trach tube and then a Betadine sterile preparation  of the lower neck and upper chest was performed in the standard fashion.  Sterile draping was accomplished in the standard fashion.  There was diffuse subcutaneous, muscular, and peritracheal oozing that was all thoroughly and meticulously cauterized using the Bovie until hemostasis was noted. I placed a 2-0 vicryl Bjork suture through the inferior skin flap and lower tracheal wall to bring the tracheostomy closer to the skin surface. Once hemostasis was noted I replaced the 8 cuffed tracheostomy tube and secured it with 4 quadrant 2-0 vicryl sutures and a Velcro Dale trach tie. CO2 return was confirmed by anesthesia. I dressed the tracheostomy site with a surgicel trach dressing. Hemostasis was observed. At this point the procedure was completed.  The patient was returned to anesthesia, and transferred back to the intensive care unit in stable condition. The patient tolerated the procedure well with no immediate complications.  Dr. Melvenia BeamMitchell Jasper [name] was present and performed the entire procedure.  Plan: leave trach cuff inflated until okay to deflate per Dr. [name] and  leave patient on the ventilator for at least 24 hours. Can discuss with Dr. [name] when it will be safe to change trach and restart the Heparin drip.  01/18/2015 2:57 AM [name], [name]

## 2015-01-18 NOTE — Anesthesia Procedure Notes (Signed)
Date/Time: 01/18/2015 2:10 AM Performed by: Molli HazardGORDON, Kevonna Nolte M Pre-anesthesia Checklist: Patient identified, Emergency Drugs available, Suction available and Patient being monitored Patient Re-evaluated:Patient Re-evaluated prior to inductionComments: Placed patient on anesthesia machine via direct connection to tracheotomy

## 2015-01-18 NOTE — Significant Event (Signed)
Spoke with Dr. Kathrene BongoGoldsborough and received telephone order for removal of left femoral HD catheter. RN removed catheter using sterile technique. No issue during or post removal. Manual pressure held for 15 minutes; VS stable. Sterile petroleum gauze and dry gauze dressing covering site. Will continue to monitor. Brayden Brodhead, Charity fundraiserN.

## 2015-01-18 NOTE — Progress Notes (Signed)
[facility] Progress Note   Subjective:  Significant event overnight with progressive bleeding from trach site (post op 5/11), initially heparin stopped and dressings applied. Persistent bleeding, PCCM called ENT who took patient to OR for emergency trach site exploration, and achieved hemostasis successfully. Recommendations were to remain on vent x 24 hrs.  Patient remains on vent since last night. Weaned on sedation but still somnolent this AM. Scheduled for IR tunneled HD cath insertion today. No new reports per nursing this AM. Unlikely transfer until demonstrates can tolerate trach off vent. Last HD 5/12 (out 3.18L) via vascath with low BFR Stable UOP to 610cc yesterday.    Filed Vitals:   01/18/15 0333 01/18/15 0400 01/18/15 0500 01/18/15 0600  BP:  120/63 111/54 110/59  Pulse:  87 84 86  Temp: 98.8 F (37.1 C)     TempSrc: Oral     Resp:  16 16 19   Height:      Weight:      SpO2:  96% 93% 93%   Exam:  Gen - s/p replaced trach, on vent, remains on sedation, NGT with feeds Neck - No JVD Chest - Mostly clear breath sounds anterior and lateral fields Heart - RRR no rub or gallop Abd - very obese, soft, NTND, +BS active Skin - Temp L groin HD cath in place GU foley in place draining dark brown urine Ext - Mild improved bilateral lower ext edema, now L>R +2 pitting edema Neuro - sedated, sleeping  Renal US 11-13 cm kidneys , no hydro UA 5.0, 1.020, prot 30, rbc 21-50/hpf, wbc 0-2/hpf UNa 30, UCreat 230 CXR 5/8 no chg mild vasc congestion, chronic IS pattern  Summary: 64 yo w DJD/ obesity underwent elective R rotator cuff repair surgery on 4/22. Normal creatinine at baseline. Postop had hypercarbic resp failure/ HCAP and septic shock rx w vasopressors and IV vanc/cefipime. Developed AKI 4/30 while still on pressors and IV abx. Vanc level on 5/1 elevated at 43 ug/mL. RRT started on 5/4 w CRRT from 5/4 - 5/7.   Assessment: 1. AKI, non-oliguric - ATN due to  shock / vanc toxicity. S/P CRRT 5/4-5/7. Now IHD - done 5/9, 5/11 and 5/12 for azotemia with BUN >100, previously using access: femoral cath (5/4). Currently with stable low  UOP 400-600cc, net >4L positive, BP stable, EDW ?315 lb. Hopeful for recovery given previous normal renal function at baseline and non-oliguria. 1. Last HD 5/11 and 5/12, out 3.18L - limited by femoral catheter flow rates and BP. 2. SCr remains elevated post-HD, 4.78 to 4.90 3. Scheduled for IR placement Tunneled HD Cath today for prolonged access for IHD 4. Repeat HD today (5/13) after HD cath placement - NO Heparin. Also correct HyperK 5.5 5. Depending on dispo plans per PCCM, patient may need repeat IHD on Sat [facility] 2. Vol excess mild improved on HD - volume removal previously difficult due to low BP's. IVF DC'd. 3. Shock / fevers - all cx's negative, s/p 2 wks abx and off pressors. BP's improved. 4. S/P R rotator cuff repair 4/22 5. Morbid obesity 6. VDRF- reintubated 4/23, failed extubation 5/8, req reintubation. ENT following, s/p trach 5/11. Complicated with trach site post-op hemorrhage 5/12, req emergent OR hemostasis and revision 5/13. 7. Bilat LE DVT / suspected PE - was on IV hep, currently held d/t hemorrhage. Holding transition to coumadin 8. Hyperphos - HD today 9. Hyperkalemia - K 5.5, persistent. 10. Anemia- previous polycythremia- hgb stable 11.1-11.4  11. Dispo - [facility] per primary [facility]CCM [facility], will continue IHD via tunneled HD cath and [facility] with continued hopes of returned renal function.  Saralyn PilarAlexander Karamalegos, DO [facility], PGY-2 (Currently working with [facility] - Renal Service) 01/18/2015, 7:55 AM   Patient seen and examined, agree with above note with above modifications. Events noted- had to go to OR for revision of trach due to bleeding.  Had HD yest but suboptimal access- will need again  today after tunneled PC placed- eventual plan to have him go to LTAC.  Volume status difficult- trying to not get too dry to drop pressure for the hopes of renal recovery but on very high fio2 Annie SableKellie Zan Triska, MD 01/18/2015      Recent Labs Lab 01/16/15 0416 01/17/15 0500 01/18/15 0455  NA 137 140 141  K 5.1 5.5* 5.5*  CL 99* 103 99*  CO2 23 24 27   GLUCOSE 226* 172* 168*  BUN 144* 108* 105*  CREATININE 5.23* 4.78* 4.90*  CALCIUM 8.9 8.7* 9.1  PHOS 10.6* 10.4* 11.7*    Recent Labs Lab 01/16/15 0416 01/17/15 0500 01/18/15 0455  ALBUMIN 2.5* 2.7* 2.8*    Recent Labs Lab 01/16/15 0416 01/17/15 0500 01/18/15 0455  WBC 14.8* 13.0* 12.5*  HGB 11.6* 11.1* 11.4*  HCT 36.1* 34.5* 35.9*  MCV 95.0 96.9 97.3  PLT 334 281 263   . alteplase  2 mg Intracatheter Once  . antiseptic oral rinse  7 mL Mouth Rinse QID  . aspirin  81 mg Per Tube Daily  . chlorhexidine  15 mL Mouth Rinse BID  . erythromycin ethylsuccinate  400 mg Per Tube TID  . famotidine  20 mg Per Tube Daily  . feeding supplement (PRO-STAT SUGAR FREE 64)  60 mL Per Tube QID  . feeding supplement (VITAL HIGH PROTEIN)  1,000 mL Per Tube Q24H  . folic acid  1 mg Per Tube Daily  . insulin aspart  0-20 Units Subcutaneous 6 times per day  . insulin glargine  15 Units Subcutaneous BID  . sodium chloride  10-40 mL Intracatheter Q12H   . sodium chloride Stopped (01/15/15 2336)  . dextrose 5 % and 0.9% NaCl 50 mL/hr at 01/18/15 0442  . heparin Stopped (01/17/15 2210)   acetaminophen (TYLENOL) oral liquid 160 mg/5 mL, fentaNYL (SUBLIMAZE) injection, heparin, heparin, heparin, heparin, hydrALAZINE, HYDROmorphone (DILAUDID) injection, lidocaine (PF), lidocaine-prilocaine, lip balm, LORazepam, meperidine (DEMEROL) injection, [DISCONTINUED] ondansetron **OR** ondansetron (ZOFRAN) IV, ondansetron (ZOFRAN) IV, sodium chloride

## 2015-01-19 ENCOUNTER — Other Ambulatory Visit (HOSPITAL_COMMUNITY): Payer: No Typology Code available for payment source

## 2015-01-19 ENCOUNTER — Inpatient Hospital Stay (HOSPITAL_COMMUNITY): Payer: Worker's Compensation

## 2015-01-19 ENCOUNTER — Inpatient Hospital Stay
Admission: AD | Admit: 2015-01-19 | Discharge: 2015-01-20 | Disposition: A | Discharge status: Nursing Home (Includes ICF) | Payer: No Typology Code available for payment source | Source: Ambulatory Visit | Attending: Internal Medicine | Admitting: Internal Medicine

## 2015-01-19 DIAGNOSIS — J9690 Respiratory failure, unspecified, unspecified whether with hypoxia or hypercapnia: Secondary | ICD-10-CM

## 2015-01-19 DIAGNOSIS — Z430 Encounter for attention to tracheostomy: Secondary | ICD-10-CM

## 2015-01-19 DIAGNOSIS — I82509 Chronic embolism and thrombosis of unspecified deep veins of unspecified lower extremity: Secondary | ICD-10-CM | POA: Insufficient documentation

## 2015-01-19 DIAGNOSIS — H6990 Unspecified Eustachian tube disorder, unspecified ear: Secondary | ICD-10-CM

## 2015-01-19 DIAGNOSIS — Z0189 Encounter for other specified special examinations: Secondary | ICD-10-CM

## 2015-01-19 LAB — RENAL FUNCTION PANEL
Albumin: 2.8 g/dL — ABNORMAL LOW (ref 3.5–5.0)
Anion gap: 17 — ABNORMAL HIGH (ref 5–15)
BUN: 133 mg/dL — ABNORMAL HIGH (ref 6–20)
CALCIUM: 9.1 mg/dL (ref 8.9–10.3)
CO2: 27 mmol/L (ref 22–32)
CREATININE: 5.14 mg/dL — AB (ref 0.61–1.24)
Chloride: 97 mmol/L — ABNORMAL LOW (ref 101–111)
GFR calc Af Amer: 13 mL/min — ABNORMAL LOW (ref >60)
GFR calc non Af Amer: 11 mL/min — ABNORMAL LOW (ref >60)
Glucose, Bld: 165 mg/dL — ABNORMAL HIGH (ref 65–99)
POTASSIUM: 5.2 mmol/L — AB (ref 3.5–5.1)
Phosphorus: 11.4 mg/dL — ABNORMAL HIGH (ref 2.5–4.6)
Sodium: 141 mmol/L (ref 135–145)

## 2015-01-19 LAB — CBC
HCT: 36.9 % — ABNORMAL LOW (ref 39.0–52.0)
Hemoglobin: 11.4 g/dL — ABNORMAL LOW (ref 13.0–17.0)
MCH: 30.2 pg (ref 26.0–34.0)
MCHC: 30.9 g/dL (ref 30.0–36.0)
MCV: 97.6 fL (ref 78.0–100.0)
Platelets: 238 10*3/uL (ref 150–400)
RBC: 3.78 MIL/uL — ABNORMAL LOW (ref 4.22–5.81)
RDW: 16.8 % — ABNORMAL HIGH (ref 11.5–15.5)
WBC: 12.8 10*3/uL — ABNORMAL HIGH (ref 4.0–10.5)

## 2015-01-19 LAB — GLUCOSE, CAPILLARY
GLUCOSE-CAPILLARY: 144 mg/dL — AB (ref 65–99)
Glucose-Capillary: 120 mg/dL — ABNORMAL HIGH (ref 65–99)
Glucose-Capillary: 143 mg/dL — ABNORMAL HIGH (ref 65–99)
Glucose-Capillary: 147 mg/dL — ABNORMAL HIGH (ref 65–99)
Glucose-Capillary: 134 mg/dL — ABNORMAL HIGH (ref 65–99)
Glucose-Capillary: 150 mg/dL — ABNORMAL HIGH (ref 65–99)

## 2015-01-19 MED ORDER — VITAL HIGH PROTEIN PO LIQD: 1000.0000 mL | ORAL | Status: DC

## 2015-01-19 MED ORDER — FAMOTIDINE 40 MG/5ML PO SUSR: 20.0000 mg | Freq: Every day | ORAL | Status: DC

## 2015-01-19 MED ORDER — LORAZEPAM 2 MG/ML IJ SOLN: 0.5000 mg | INTRAMUSCULAR | Status: DC | PRN

## 2015-01-19 MED ORDER — ONDANSETRON HCL 4 MG/2ML IJ SOLN: 4.0000 mg | Freq: Four times a day (QID) | INTRAMUSCULAR | Status: DC | PRN

## 2015-01-19 MED ORDER — LIP MEDEX EX OINT: TOPICAL_OINTMENT | CUTANEOUS | Status: DC | PRN

## 2015-01-19 MED ORDER — HEPARIN SODIUM (PORCINE) 1000 UNIT/ML DIALYSIS: 1000.0000 [IU] | INTRAMUSCULAR | Status: DC | PRN

## 2015-01-19 MED ORDER — HYDRALAZINE HCL 20 MG/ML IJ SOLN: 10.0000 mg | INTRAMUSCULAR | Status: DC | PRN

## 2015-01-19 MED ORDER — CHLORHEXIDINE GLUCONATE 0.12 % MT SOLN: 15.0000 mL | Freq: Two times a day (BID) | OROMUCOSAL | Status: DC

## 2015-01-19 MED ORDER — ERYTHROMYCIN ETHYLSUCCINATE 200 MG/5ML PO SUSR: 400.0000 mg | Freq: Three times a day (TID) | ORAL | Status: DC

## 2015-01-19 MED ORDER — FENTANYL CITRATE (PF) 100 MCG/2ML IJ SOLN: 25.0000 ug | INTRAMUSCULAR | Status: DC | PRN

## 2015-01-19 MED ORDER — SODIUM CHLORIDE 0.9 % IJ SOLN: 10.0000 mL | INTRAMUSCULAR | Status: DC | PRN

## 2015-01-19 MED ORDER — INSULIN ASPART 100 UNIT/ML ~~LOC~~ SOLN: 0.0000 [IU] | SUBCUTANEOUS | Status: DC

## 2015-01-19 MED ORDER — PRO-STAT SUGAR FREE PO LIQD: 60.0000 mL | Freq: Four times a day (QID) | ORAL | Status: DC

## 2015-01-19 MED ORDER — CETYLPYRIDINIUM CHLORIDE 0.05 % MT LIQD: 7.0000 mL | Freq: Four times a day (QID) | OROMUCOSAL | Status: DC

## 2015-01-19 MED ORDER — SODIUM CHLORIDE 0.9 % IV SOLN: INTRAVENOUS | Status: DC

## 2015-01-19 MED ORDER — ACETAMINOPHEN 160 MG/5ML PO SOLN: 650.0000 mg | Freq: Four times a day (QID) | ORAL | Status: DC | PRN

## 2015-01-19 MED ORDER — INSULIN GLARGINE 100 UNIT/ML ~~LOC~~ SOLN: 15.0000 [IU] | Freq: Two times a day (BID) | SUBCUTANEOUS | Status: DC

## 2015-01-19 MED ORDER — FOLIC ACID 1 MG PO TABS: 1.0000 mg | ORAL_TABLET | Freq: Every day | ORAL | Status: DC

## 2015-01-19 MED ORDER — ASPIRIN 81 MG PO CHEW: 81.0000 mg | CHEWABLE_TABLET | Freq: Every day | ORAL | Status: DC

## 2015-01-19 NOTE — Progress Notes (Signed)
HD tx ended 30 minutes early d/t clot in system. [facility]. Primary RN present and aware. Pt. VSS.

## 2015-01-19 NOTE — Progress Notes (Signed)
[facility] Progress Note   Subjective:  Had TDC and IVC filter placed yesterday per IR. Femoral cath removed. HD last night disrupted by high venous pressure after 0.5 hrs. Hopefully new catheter syndrome On pressure support. No further bleeding from trach site.   Objective: Temp:  [98.9 F (37.2 C)-100.1 F (37.8 C)] 99.4 F (37.4 C) (05/14 0335) Pulse Rate:  [79-99] 95 (05/14 0630) Resp:  [13-26] 19 (05/14 0630) BP: (88-166)/(51-140) 154/72 mmHg (05/14 0630) SpO2:  [91 %-100 %] 97 % (05/14 0630) FiO2 (%):  [50 %-100 %] 50 % (05/14 0400) Weight:  [317 lb 7.4 oz (144 kg)-326 lb 4.5 oz (148 kg)] 326 lb 4.5 oz (148 kg) (05/14 0500)  Intake/Output Summary (Last 24 hours) at 01/19/15 0749 Last data filed at 01/19/15 0600  Gross per 24 hour  Intake 451.67 ml  Output   1974 ml  Net -1522.33 ml   Exam: Gen - Obese sedated 64 yo male Neck - No JVD Chest - Mostly clear breath sounds anterior and lateral fields Heart - RRR no rub or gallop Abd - very obese, soft, NTND, +BS active Skin - Brawny stasis dermatitis on bilateral lower legs.  GU - foley in place Ext - Mild improved bilateral lwer ext edema, now L>R +2 pitting edema Neuro - sower ext edema, now L>R +2 pitting edema Neuro - sleeping  Renal US 11-13 cm kidneys , no hydro UA 5.0, 1.020, prot 30, rbc 21-50/hpf, wbc 0-2/hpf UNa 30, UCreat 230 CXR 5/8 no chg mild vasc congestion, chronic IS pattern  Summary: 64 yo w DJD/ obesity underwent elective R rotator cuff repair surgery on 4/22. Normal creatinine at baseline. Postop had hypercarbic resp failure/ HCAP and septic shock rx w vasopressors and IV vanc/cefipime. Developed AKI 4/30 while still on pressors and IV abx. Vanc level on 5/1 elevated at 43 ug/mL. RRT started on 5/4 w CRRT from 5/4 - 5/7.   Assessment: 1. AKI, non-oliguric - ATN due to shock / vanc toxicity. S/P CRRT 5/4-5/7. Now IHD - done 5/9, 5/11 and 5/12 and attempted 5/13 for azotemia with BUN  >100, previously using access: femoral cath (5/4). Currently with stable low UOP 400-600cc, net >4L positive, BP stable, EDW ?315 lb. Hopeful for recovery given previous normal renal function at baseline 1. Received HD 5/11, 5/12 (both femoral cath) and 5/13 (0.5 hours, net UF -116ml due to rising venous pressure) 2. [facility] 2. Volume overload: Tenuous volume status with lower BPs but high FiO2. Will repeat HD today. 3. Shock / fevers - all cx's negative, s/p 2 wks abx and off pressors. BP's improved. 4. S/P R rotator cuff repair 4/22 5. Morbid obesity 6. VDRF- reintubated 4/23, failed extubation 5/8, req reintubation. ENT following, s/p trach 5/11. Complicated with trach site post-op hemorrhage 5/12, req emergent OR hemostasis and revision 5/13. 7. Bilat LE DVT / suspected PE - was on IV hep, currently held d/t hemorrhage. Holding transition to coumadin 8. Hyperphosphatemia - Continue HD,  9. Hyperkalemia - Persistent, continue to tx with HD as tolerated  10. Anemia- previous polycythremia- hgb stable 11.1-11.4- no meds needed  11. Dispo - Plans per primary PCCM to transfer to LTAC end of this week, will continue IHD via tunneled HD cath and [facility] AKI with continued hopes of returned renal function.  Ryan B. Jarvis NewcomerGrunz, MD, PGY-2 01/19/2015 7:44 AM   Patient seen and examined, agree  with above note with above modifications. 64 year old with prolonged AKI in the setting of elective rotator cuff repair and postop resp failure.  HD dependent since 5/4- is nonoliguric with normal creatinine at baseline-still hopeful for recovery- have not had GOOD HD treatment for several days due to poorly functioning femoral cath, now new IJ tunneled- hoping today will be the day.  Eventual plan foe transfer to [facility] Annie SableKellie Marqus Macphee, MD 01/19/2015      Recent Labs Lab 01/17/15 0500 01/18/15 0455 01/19/15 0521  NA 140 141 141   K 5.5* 5.5* 5.2*  CL 103 99* 97*  CO2 24 27 27   GLUCOSE 172* 168* 165*  BUN 108* 105* 133*  CREATININE 4.78* 4.90* 5.14*  CALCIUM 8.7* 9.1 9.1  PHOS 10.4* 11.7* 11.4*   Recent Labs Lab 01/17/15 0500 01/18/15 0455 01/19/15 0521  ALBUMIN 2.7* 2.8* 2.8*   Recent Labs Lab 01/17/15 0500 01/18/15 0455 01/19/15 0521  WBC 13.0* 12.5* 12.8*  HGB 11.1* 11.4* 11.4*  HCT 34.5* 35.9* 36.9*  MCV 96.9 97.3 97.6  PLT 281 263 238

## 2015-01-19 NOTE — Progress Notes (Signed)
Patient transferred to [facility] on b[facility]ric bed. Bed left at [facility] because they unable to obtain a working bed for the patient. Patient on trach collar; all vitals stable. Patients belongings transferred include clothing and pillow.

## 2015-01-19 NOTE — Progress Notes (Signed)
HD started with no complications. Vss. Report received from Con-wayCrystal Manus, RN

## 2015-01-19 NOTE — Procedures (Signed)
Patient was seen on dialysis and the procedure was supervised.  BFR 300  Via PC BP is  127/74.   Patient appears to be tolerating treatment well  [name] A 01/19/2015

## 2015-01-19 NOTE — Progress Notes (Signed)
Rising venous pressure disrupted dialysis treatment per MD Briant CedarMattingly, okay to TPA for catheter dwell,  received .50 hour of treament .Marland Kitchen.Marland Kitchen.Marland Kitchen..Marland Kitchen

## 2015-01-19 NOTE — Progress Notes (Signed)
Patient placed on 35% trach collar.  Currently tolerating well.  Will continue to monitor.  

## 2015-01-20 ENCOUNTER — Inpatient Hospital Stay (HOSPITAL_COMMUNITY)
Admission: RE | Admit: 2015-01-20 | Discharge: 2015-01-29 | DRG: 166 | Disposition: A | Discharge status: Other Acute Inpatient Hospital | Payer: Worker's Compensation | Source: Ambulatory Visit | Attending: Pulmonary Disease | Admitting: Pulmonary Disease

## 2015-01-20 ENCOUNTER — Inpatient Hospital Stay (HOSPITAL_COMMUNITY): Payer: Worker's Compensation

## 2015-01-20 ENCOUNTER — Other Ambulatory Visit (HOSPITAL_COMMUNITY): Payer: No Typology Code available for payment source

## 2015-01-20 DIAGNOSIS — G9340 Encephalopathy, unspecified: Secondary | ICD-10-CM | POA: Diagnosis present

## 2015-01-20 DIAGNOSIS — E876 Hypokalemia: Secondary | ICD-10-CM | POA: Diagnosis not present

## 2015-01-20 DIAGNOSIS — Z430 Encounter for attention to tracheostomy: Principal | ICD-10-CM

## 2015-01-20 DIAGNOSIS — R011 Cardiac murmur, unspecified: Secondary | ICD-10-CM | POA: Diagnosis present

## 2015-01-20 DIAGNOSIS — N170 Acute kidney failure with tubular necrosis: Secondary | ICD-10-CM | POA: Diagnosis present

## 2015-01-20 DIAGNOSIS — Y95 Nosocomial condition: Secondary | ICD-10-CM | POA: Diagnosis present

## 2015-01-20 DIAGNOSIS — J449 Chronic obstructive pulmonary disease, unspecified: Secondary | ICD-10-CM | POA: Diagnosis present

## 2015-01-20 DIAGNOSIS — R739 Hyperglycemia, unspecified: Secondary | ICD-10-CM | POA: Diagnosis present

## 2015-01-20 DIAGNOSIS — J9611 Chronic respiratory failure with hypoxia: Secondary | ICD-10-CM | POA: Diagnosis not present

## 2015-01-20 DIAGNOSIS — Z7901 Long term (current) use of anticoagulants: Secondary | ICD-10-CM

## 2015-01-20 DIAGNOSIS — J189 Pneumonia, unspecified organism: Secondary | ICD-10-CM | POA: Diagnosis present

## 2015-01-20 DIAGNOSIS — J9621 Acute and chronic respiratory failure with hypoxia: Secondary | ICD-10-CM

## 2015-01-20 DIAGNOSIS — J982 Interstitial emphysema: Secondary | ICD-10-CM

## 2015-01-20 DIAGNOSIS — J9622 Acute and chronic respiratory failure with hypercapnia: Secondary | ICD-10-CM | POA: Diagnosis present

## 2015-01-20 DIAGNOSIS — E872 Acidosis: Secondary | ICD-10-CM | POA: Diagnosis present

## 2015-01-20 DIAGNOSIS — M1990 Unspecified osteoarthritis, unspecified site: Secondary | ICD-10-CM | POA: Diagnosis present

## 2015-01-20 DIAGNOSIS — Z6841 Body Mass Index (BMI) 40.0 and over, adult: Secondary | ICD-10-CM | POA: Diagnosis not present

## 2015-01-20 DIAGNOSIS — E875 Hyperkalemia: Secondary | ICD-10-CM | POA: Diagnosis present

## 2015-01-20 DIAGNOSIS — E46 Unspecified protein-calorie malnutrition: Secondary | ICD-10-CM

## 2015-01-20 DIAGNOSIS — E871 Hypo-osmolality and hyponatremia: Secondary | ICD-10-CM | POA: Diagnosis not present

## 2015-01-20 DIAGNOSIS — F17210 Nicotine dependence, cigarettes, uncomplicated: Secondary | ICD-10-CM | POA: Diagnosis present

## 2015-01-20 DIAGNOSIS — Z930 Tracheostomy status: Secondary | ICD-10-CM | POA: Insufficient documentation

## 2015-01-20 DIAGNOSIS — I4510 Unspecified right bundle-branch block: Secondary | ICD-10-CM | POA: Diagnosis present

## 2015-01-20 DIAGNOSIS — E662 Morbid (severe) obesity with alveolar hypoventilation: Secondary | ICD-10-CM

## 2015-01-20 DIAGNOSIS — I10 Essential (primary) hypertension: Secondary | ICD-10-CM | POA: Diagnosis present

## 2015-01-20 DIAGNOSIS — E870 Hyperosmolality and hypernatremia: Secondary | ICD-10-CM | POA: Diagnosis present

## 2015-01-20 DIAGNOSIS — T797XXA Traumatic subcutaneous emphysema, initial encounter: Secondary | ICD-10-CM | POA: Diagnosis present

## 2015-01-20 DIAGNOSIS — J9601 Acute respiratory failure with hypoxia: Secondary | ICD-10-CM

## 2015-01-20 DIAGNOSIS — I5032 Chronic diastolic (congestive) heart failure: Secondary | ICD-10-CM | POA: Diagnosis present

## 2015-01-20 DIAGNOSIS — D751 Secondary polycythemia: Secondary | ICD-10-CM | POA: Diagnosis present

## 2015-01-20 DIAGNOSIS — Z792 Long term (current) use of antibiotics: Secondary | ICD-10-CM | POA: Diagnosis not present

## 2015-01-20 DIAGNOSIS — E785 Hyperlipidemia, unspecified: Secondary | ICD-10-CM | POA: Diagnosis present

## 2015-01-20 DIAGNOSIS — Z86718 Personal history of other venous thrombosis and embolism: Secondary | ICD-10-CM | POA: Diagnosis not present

## 2015-01-20 DIAGNOSIS — Z7982 Long term (current) use of aspirin: Secondary | ICD-10-CM | POA: Diagnosis not present

## 2015-01-20 DIAGNOSIS — J939 Pneumothorax, unspecified: Secondary | ICD-10-CM

## 2015-01-20 LAB — CBC WITH DIFFERENTIAL/PLATELET
Basophils Absolute: 0.1 10*3/uL (ref 0.0–0.1)
Basophils Relative: 1 % (ref 0–1)
Eosinophils Absolute: 0.5 10*3/uL (ref 0.0–0.7)
Eosinophils Relative: 3 % (ref 0–5)
HCT: 36.7 % — ABNORMAL LOW (ref 39.0–52.0)
Hemoglobin: 11.6 g/dL — ABNORMAL LOW (ref 13.0–17.0)
LYMPHS ABS: 1.3 10*3/uL (ref 0.7–4.0)
Lymphocytes Relative: 9 % — ABNORMAL LOW (ref 12–46)
MCH: 31 pg (ref 26.0–34.0)
MCHC: 31.6 g/dL (ref 30.0–36.0)
MCV: 98.1 fL (ref 78.0–100.0)
MONO ABS: 1.7 10*3/uL — AB (ref 0.1–1.0)
Monocytes Relative: 12 % (ref 3–12)
NEUTROS ABS: 10.8 10*3/uL — AB (ref 1.7–7.7)
Neutrophils Relative %: 75 % (ref 43–77)
Platelets: 194 10*3/uL (ref 150–400)
RBC: 3.74 MIL/uL — ABNORMAL LOW (ref 4.22–5.81)
RDW: 16.7 % — AB (ref 11.5–15.5)
WBC: 14.3 10*3/uL — ABNORMAL HIGH (ref 4.0–10.5)

## 2015-01-20 LAB — GLUCOSE, CAPILLARY
GLUCOSE-CAPILLARY: 133 mg/dL — AB (ref 65–99)
Glucose-Capillary: 157 mg/dL — ABNORMAL HIGH (ref 65–99)
Glucose-Capillary: 138 mg/dL — ABNORMAL HIGH (ref 65–99)
Glucose-Capillary: 129 mg/dL — ABNORMAL HIGH (ref 65–99)

## 2015-01-20 LAB — BASIC METABOLIC PANEL
ANION GAP: 18 — AB (ref 5–15)
BUN: 102 mg/dL — ABNORMAL HIGH (ref 6–20)
CHLORIDE: 97 mmol/L — AB (ref 101–111)
CO2: 26 mmol/L (ref 22–32)
CREATININE: 4.01 mg/dL — AB (ref 0.61–1.24)
Calcium: 9 mg/dL (ref 8.9–10.3)
GFR calc Af Amer: 17 mL/min — ABNORMAL LOW (ref >60)
GFR, EST NON AFRICAN AMERICAN: 15 mL/min — AB (ref >60)
Glucose, Bld: 162 mg/dL — ABNORMAL HIGH (ref 65–99)
POTASSIUM: 5.2 mmol/L — AB (ref 3.5–5.1)
Sodium: 141 mmol/L (ref 135–145)

## 2015-01-20 MED ORDER — FENTANYL CITRATE (PF) 100 MCG/2ML IJ SOLN: 100.0000 ug | INTRAMUSCULAR | Status: DC | PRN

## 2015-01-20 MED ORDER — CHLORHEXIDINE GLUCONATE 0.12 % MT SOLN
15.0000 mL | Freq: Two times a day (BID) | OROMUCOSAL | Status: DC
  Administered 2015-01-20 – 2015-01-29 (×18): 15 mL via OROMUCOSAL
  Filled 2015-01-20 (×19): qty 15

## 2015-01-20 MED ORDER — CETYLPYRIDINIUM CHLORIDE 0.05 % MT LIQD
7.0000 mL | Freq: Two times a day (BID) | OROMUCOSAL | Status: DC
  Administered 2015-01-20 – 2015-01-25 (×12): 7 mL via OROMUCOSAL

## 2015-01-20 MED ORDER — ACETAMINOPHEN 325 MG PO TABS: 650.0000 mg | ORAL_TABLET | ORAL | Status: DC | PRN

## 2015-01-20 MED ORDER — INSULIN ASPART 100 UNIT/ML ~~LOC~~ SOLN
0.0000 [IU] | SUBCUTANEOUS | Status: DC
  Administered 2015-01-20 (×3): 1 [IU] via SUBCUTANEOUS
  Administered 2015-01-20: 2 [IU] via SUBCUTANEOUS
  Administered 2015-01-20 – 2015-01-22 (×6): 1 [IU] via SUBCUTANEOUS

## 2015-01-20 MED ORDER — ONDANSETRON HCL 4 MG/2ML IJ SOLN: 4.0000 mg | Freq: Four times a day (QID) | INTRAMUSCULAR | Status: DC | PRN

## 2015-01-20 MED ORDER — FOLIC ACID 1 MG PO TABS
1.0000 mg | ORAL_TABLET | Freq: Every day | ORAL | Status: DC
  Administered 2015-01-20 – 2015-01-29 (×10): 1 mg via ORAL
  Filled 2015-01-20 (×10): qty 1

## 2015-01-20 MED ORDER — FAMOTIDINE 40 MG/5ML PO SUSR
20.0000 mg | Freq: Two times a day (BID) | ORAL | Status: DC
  Administered 2015-01-20 – 2015-01-21 (×4): 20 mg
  Filled 2015-01-20 (×7): qty 2.5

## 2015-01-20 MED ORDER — SODIUM CHLORIDE 0.9 % IV SOLN
250.0000 mL | INTRAVENOUS | Status: DC | PRN
  Administered 2015-01-20: 250 mL via INTRAVENOUS
  Administered 2015-01-22: 08:00:00 via INTRAVENOUS
  Administered 2015-01-24 – 2015-01-26 (×3): 250 mL via INTRAVENOUS

## 2015-01-20 MED ORDER — SODIUM CHLORIDE 0.9 % IV SOLN
INTRAVENOUS | Status: DC
  Administered 2015-01-20: 07:00:00 via INTRAVENOUS

## 2015-01-20 MED ORDER — ERYTHROMYCIN ETHYLSUCCINATE 400 MG/5ML PO SUSR
400.0000 mg | Freq: Three times a day (TID) | ORAL | Status: DC
  Administered 2015-01-20 – 2015-01-29 (×28): 400 mg
  Filled 2015-01-20 (×35): qty 5

## 2015-01-20 NOTE — Progress Notes (Signed)
[facility] Progress Note   Subjective:  Events noted- had HD yest- UF of 2100 - then transferred to [facility]- apparently trach got dislodged so transfer back to 2100 early this AM   Objective: Temp:  [99 F (37.2 C)-99.8 F (37.7 C)] 99.8 F (37.7 C) (05/15 0801) Pulse Rate:  [79-100] 94 (05/15 0800) Resp:  [13-26] 18 (05/15 0800) BP: (96-138)/(57-88) 138/85 mmHg (05/15 0800) SpO2:  [93 %-100 %] 100 % (05/15 0800) FiO2 (%):  [35 %-40 %] 40 % (05/15 0800) Weight:  [140 kg (308 lb 10.3 oz)] 140 kg (308 lb 10.3 oz) (05/15 0600)  Intake/Output Summary (Last 24 hours) at 01/20/15 0905 Last data filed at 01/20/15 0800  Gross per 24 hour  Intake  50.83 ml  Output      0 ml  Net  50.83 ml   Exam: Gen - Obese sedated 64 yo male Neck - No JVD Chest - Mostly clear breath sounds anterior and lateral fields Heart - RRR no rub or gallop Abd - very obese, soft, NTND, +BS active Skin - Brawny stasis dermatitis on bilateral lower legs.  GU - foley in place Ext - Mild improved bilateral lwer ext edema, now L>R +2 pitting edema Neuro - sower ext edema, now L>R +2 pitting edema Neuro - sleeping  Renal US 11-13 cm kidneys , no hydro UA 5.0, 1.020, prot 30, rbc 21-50/hpf, wbc 0-2/hpf UNa 30, UCreat 230 CXR 5/8 no chg mild vasc congestion, chronic IS pattern  Summary: [age] yo w DJD/ obesity underwent elective R rotator cuff repair surgery on 4/22. Normal creatinine at baseline. Postop had hypercarbic resp failure/ HCAP and septic shock rx w vasopressors and IV vanc/cefipime. Developed AKI 4/30 while still on pressors and IV abx. Vanc level on 5/1 elevated at 43 ug/mL. RRT started on 5/4 w CRRT from 5/4 - 5/7.   Assessment: 1. AKI, non-oliguric - ATN due to shock / vanc toxicity. S/P CRRT 5/4-5/7. Now IHD- done daily to every other day due to cath dysfunction/catabolic/azotemic state  Hopeful for recovery given previous normal renal function at baseline and has had intermittent  UOP  1. Received HD basically daily due to issues above 2. Plan to repeat HD tomorrow via new PC placed 5/13 2. Volume overload: Tenuous volume status  3. Shock / fevers - all cx's negative, s/p 2 wks abx and off pressors. BP's improved. 4. S/P R rotator cuff repair 4/22 5. Morbid obesity 6. VDRF- reintubated 4/23, failed extubation 5/8. , s/p trach 5/11. Complicated with trach site post-op hemorrhage 5/12, req emergent OR hemostasis and revision 5/13. Now dislodged while at [facility] 7. Bilat LE DVT / suspected PE - was on IV hep, currently held d/t hemorrhage. transition to coumadin per primary- also s/p filter 8. Hyperphosphatemia - Continue HD, - on continuous TF so difficult to manage 9. Hyperkalemia - Persistent, continue to tx with HD as tolerated  10. Anemia- previous polycythremia- hgb stable 11.1-11.4- no meds needed  11. Dispo - Plans per primary PCCM to transfer to LTAC at some point  Yoshiaki Kreuser A   01/20/2015 9:05 AM        Recent Labs Lab 01/17/15 0500 01/18/15 0455 01/19/15 0521 01/20/15 0650  NA 140 141 141 141  K 5.5* 5.5* 5.2* 5.2*  CL 103 99* 97* 97*  CO2 24 27 27 26   GLUCOSE 172* 168* 165* 162*  BUN 108* 105* 133* 102*  CREATININE 4.78* 4.90* 5.14* 4.01*  CALCIUM 8.7* 9.1 9.1 9.0  PHOS 10.4* 11.7* 11.4*  --     Recent Labs Lab 01/17/15 0500 01/18/15 0455 01/19/15 0521  ALBUMIN 2.7* 2.8* 2.8*    Recent Labs Lab 01/18/15 0455 01/19/15 0521 01/20/15 0650  WBC 12.5* 12.8* 14.3*  NEUTROABS  --   --  10.8*  HGB 11.4* 11.4* 11.6*  HCT 35.9* 36.9* 36.7*  MCV 97.3 97.6 98.1  PLT 263 238 194

## 2015-01-20 NOTE — Care Management (Signed)
CARE MANAGEMENT NOTE 01/20/2015  Patient:  Nicholas LiesCURRENT,[name]   Account Number:  [id number]  Date Initiated:  12/30/2014  Documentation initiated by:  DAVIS,RHONDA  Subjective/Objective Assessment:   pt became acute resp failure after rotator cuff repair requiring cpap then bipap and finally requed intubation     Action/Plan:   home when stable   Anticipated DC Date:  01/18/2015   Anticipated DC Plan:  LONG TERM ACUTE CARE (LTAC)  In-house referral  Clinical Social Worker      DC Planning Services  CM consult      [facility]  NA   Choice offered to / List presented to:  NA      DME agency  NA     HH arranged  NA      HH agency  NA   Status of service:  Completed, signed off Medicare Important Message given?  NO (If response is "NO", the following Medicare IM given date fields will be blank) Date Medicare IM given:   Medicare IM given by:   Date Additional Medicare IM given:   Additional Medicare IM given by:    Discharge Disposition:  LONG TERM ACUTE CARE (LTAC)  Per UR Regulation:  Reviewed for med. necessity/level of care/duration of stay  If discussed at Long Length of Stay Meetings, dates discussed:   01/15/2015    Comments:  case manager for workers comp patty harper [phone]  Pt has RN from [employer]) named Venida JarvisPatty Harper. Her number is [phone]. Their fax number is [phone].  [email or web address]    Workers comp claim #- [id number]- insurance- [employer]- fax#-[phone]- adjusterTyson Dense- Nicole Deloia- [phone]  01/18/15- 1130- Donn PieriniKristi Tahni Porchia RN, BSN [phone] Pt will need IVC and HD cath placed today , had bleeding from trach site last night- per MD will hold tx to [facility] today and plan to d/c to [facility] tomorrow- 01/19/15- have spoken with Diona FantiAiesha from [facility] and confirmed that bed will be available for tx tomorrow to [facility]- signed Jeannette CorpusCobra form has been placed in Shadow chart for  tx.  01/17/15- 1030- Spoke with Patty this am regarding LTACH option for pt and was told by Alexia FreestonePatty that [facility] and that she and Joni Reiningicole had discussed [facility] vs [facility] and they had [facility]- so pt has approval to go to [facility] when medically stable- hopeful for Friday of this week after HD cath placed in IR today-  Notified Aiesha with [facility] of Workers Comp approval- [facility] working on bed for pt- also notified Sue Lushndrea with [facility] of decision. Call made to pt's brother Chrissie NoaWilliam Hert to discuss LTACH option and plans - informed brother that workers comp [facility] in pt's recovery process- and that they had chosen [facility] as the [facility] of choice- pt's brother voices understanding and is agreeable to the plan to move pt to [facility] when MD determines pt to be medically stable possibly tomorrow-- Cobra form place on shadow chart   01/16/15-  1100- [facility]- [facility] made to [facility] and [facility] - pt may be ready for discharge at the end of the week -    01/14/15- 1600- Donn PieriniKristi Kortlynn Poust RN, BSN [phone] consult ENT for trach placement today   01-10-15 3:40am Avie ArenasSarah Brown, RNBSN - [phone] [facility].  Remains on pressors and vent.  Now with bilateraly DVT's.  Presume PE but unable to  do CT due to renal function.  CM will continue to follow.l  January 02, 2015/Rhonda L. Earlene Plateravis, RN, BSN, CCM. Case Management [employer] [phone] No discharge needs present of time of review. remains on full vent support.  December 30, 2014/Rhonda L. Earlene Plateravis, RN, BSN, CCM. Case Management [employer] [phone] No discharge needs present of time of review.

## 2015-01-20 NOTE — Progress Notes (Addendum)
S:  Called to bedside for staff concerns for not being able to ventilate patient.  Vent not delivering volumes.  Staff report they turned the patient to assess skin for wounds and when they turned him back over, they had difficulty with ventilation.     O:  Vitals: 99.4, 125/74, 92, 17, 94% (35% FiO2)  EXAM: General: morbidly obese male on vent, no distress HEENT:  Large trach incision (~2 inches, hx prior revision), #8 shiley trach - RT unable to pass suction catheter, difficult to bag patient.  Small amount of old blood at site, no acute bleeding.  Unable to remove trach due to resistance after ETT placed.  Neuro: encephalopathic, grimaces to pain, +cough/gag CV: s1s2 rrr, no m/r/g PULM: resp's even/non-labored, spontaneous respirations, lungs bilaterally coarse, diminished on R.  GI: obese, soft, bsx4 active  Extremities: warm/dry, no edema  A:  Dislodged Tracheostomy  Pneumomediastinum R Pneumothorax  Subcutaneous Emphysema    [facility]  Pt orally intubated per Anesthesia Discussed trach with ENT.  Unable to remove trach after placement of ETT due to resistance (despite cuff being down).  Given difficult airway (redundant tissue seen with Glide) and recent revision for hematoma, I did not apply aggressive pressure / force for removal.  Will defer to ENT for management.  Site covered with dressing.  Trach left in place.     Canary BrimBrandi Nhia Heaphy, NP-C [facility] Pgr: [phone] or [phone]

## 2015-01-20 NOTE — H&P (Signed)
PULMONARY / CRITICAL CARE MEDICINE   Name: Fayrene FearingJames Schou MRN: [id number] DOB: 1951-05-04    ADMISSION DATE:  01/20/2015  [facility]   CHIEF COMPLAINT:  Tracheostomy Dislodgement  INITIAL PRESENTATION: [name] initially admitted 4/22-5/14 for R rotator cuff repair by Ortho.  Failed extubation post-op and reintubated 4/23.  Post operative course complicated by prolonged respiratory respiratory failure, findings of testicular masses, acute renal failure requiring CVVHD, BLE DVT, and tracheostomy bleeding s/p revision.  [facility] and tracheostomy became dislodged, [facility] early am.    STUDIES:  4/23 US scrotum: Two solid extratesticular right scrotal masses. Primary differential diagnosis for extratesticular masses include primary or metastatic neoplasms including adenomatoid tumor, fibroma, leiomyoma, mesothelioma, or sarcoma. Urology consultation is recommended  4/26 TEE: LVEF 60-65%, Grade 1 DD, trivial pericardial effusion 4/27 US testes with masses 5/02 renal US: no hydronephrosis, Possible nonobstructing stone lower pole left kidney  SIGNIFICANT EVENTS: 4/22 - 5/14  Admit for R rotator cuff repair, complicated course with prolonged resp fx, renal fx, DVT, testicular masses   HISTORY OF PRESENT ILLNESS:  64 y.o. y/o male, smoker,with a PMH of HTN, suspected COPD (44 pk yr hx and pre-op Hgb of 17 suggestive of hypoxia) who was admitted on 4/22 - 5/6 for an elective right shoulder surgery for rotator cuff repair. The patient had a difficult post-operative course beginning with difficulty extubating after surgery. He was admitted to SDU for observation. On 4/23, he developed progressive acute on chronic hypercarbic / hypoxic respiratory failure w/ increased work of breathing, accessory muscle use and somnolence, ABG obtained and ph 7/19/ PCO2 77, and PO2 of 76% on 100% FIO2, (his baseline expected PCO2 was 51.5 mmHG) this was in spite  of NIPPV so he was re-intubated.His hospital course was complicated by: possible HCAP (treated w/ empiric abx and No organism identified), decompensated diastolic HF, demand ischemia and circulatory shock for which he required pressor support.  He had difficulty weaning him and initially we consulted ENT on 5/2 for trach placement, but pt deemed too critically ill @ the time to proceed. On 5/3 is renal function continued to worsen (creatinine up to 4.1 and BUN up to 165) and we consulted Nephrology. They felt acute renal failure was d/t ATN after shock. On 5/04 LE venous duplex were obtained to evaluate LE edema. This was positive for bilateral Lower extremity DVT. He was started on anticoagulation. He had on-going acute encephalopathy. [facility]. On 5/06 Off pressors. Transitioned off CRRT to intermittent HD. 5/08 Failed extubation attempt and was re-intubated. 5/09 ENT re- consult for trach tube placement. We continued weaning efforts. He underwenttrach placement [facility]([place]) on 5/11. On 5/12 he was Tolerating ATC. Continuous sedation discontinued. The evening of 5/12 he was noted to have marked trach site bleeding and heparin gtt was stopped. ENT was emergently consulted. He underwent emergent exploration of trach site for active and ongoing hemorrhage at 0200 in the am of 5/13. Surgical findings identified: diffuse subcutaneous, muscular, and peritracheal oozing that was all thoroughly and cauterized using the Bovie until hemostasis was noted. He returned to the ICU on full vent support w/ replaced #8 trach w/ cuff inflated. On 5/13 he went for IVC filter as well as his right ij tunneled HD catheter. At time of discharge his tracheal bleeding had essentially resolved, he has had his IJ HD cath placed and his IVC filter has been  placed.   [facility] ~ 2100 for further rehab.  PCCM was called emergently to [facility] around 0300 for staff concerns of not  being able to ventilate the patient.  They reported they turned the patient to assess skin for wounds and when they turned him back over, they had difficulty with ventilation. RT had attempted to suction but could not pass the catheter.  At time of arrival, the patient was on the ventilator but no volumes being delivered.  Pt was taken off the vent and attempted to use BVM but was difficult to bag.  Tracheostomy appeared to be sticking out of the neck and not in appropriate position.  Balloon was visualized near opening.  RT had attempted to reposition / replace trach prior to arrival without success. The patient had spontaneous breathing with saturations of 99% while off vent.  At some point prior to events he was medicated with IV sedation for "bucking the vent".  Anesthesia attempted to place an OETT from above with some difficulty due to redundant tissue but they were successful at placement.  On call ENT was notified and decision was made to leave ETT for now and remove tracheostomy.  Tracheostomy balloon deflated but unable to remove trach.  Post procedure CXR was positive for a R pneumothorax, subcutaneous emphysema and pneumomediastinum.  The patient was transferred back to [facility] ICU for further care.      PAST MEDICAL HISTORY :   has a past medical history of Arthritis; RCT (rotator cuff tear); Multiple abrasions; and Morbid obesity.  has past surgical history that includes No past surgeries; Shoulder arthroscopy with subacromial decompression, rotator cuff repair and bicep tendon repair (Right, 12/28/2014); and Tracheostomy tube placement (N/A, 01/16/2015).   Prior to Admission medications   Medication Sig Start Date End Date Taking? Authorizing Provider  acetaminophen (TYLENOL) 160 MG/5ML solution Place 20.3 mLs (650 mg total) into feeding tube every 6 (six) hours as needed for mild pain, headache or fever. 01/19/15   Simonne Martinet, NP  Amino Acids-Protein Hydrolys (FEEDING SUPPLEMENT, PRO-STAT  SUGAR FREE 64,) LIQD Place 60 mLs into feeding tube 4 (four) times daily. 01/19/15   Simonne Martinet, NP  antiseptic oral rinse (CPC / CETYLPYRIDINIUM CHLORIDE 0.05%) 0.05 % LIQD solution 7 mLs by Mouth Rinse route QID. 01/19/15   Simonne Martinet, NP  aspirin 81 MG chewable tablet Place 1 tablet (81 mg total) into feeding tube daily. 01/19/15   Simonne Martinet, NP  chlorhexidine (PERIDEX) 0.12 % solution 15 mLs by Mouth Rinse route 2 (two) times daily. 01/19/15   Simonne Martinet, NP  erythromycin ethylsuccinate (EES) 200 MG/5ML suspension Place 10 mLs (400 mg total) into feeding tube 3 (three) times daily. 01/19/15   Simonne Martinet, NP  famotidine (PEPCID) 40 MG/5ML suspension Place 2.5 mLs (20 mg total) into feeding tube daily. 01/19/15   Simonne Martinet, NP  fentaNYL (SUBLIMAZE) 100 MCG/2ML injection Inject 0.5-2 mLs (25-100 mcg total) into the vein every 2 (two) hours as needed for severe pain. 01/19/15   Simonne Martinet, NP  folic acid (FOLVITE) 1 MG tablet Place 1 tablet (1 mg total) into feeding tube daily. 01/19/15   Simonne Martinet, NP  heparin 1000 unit/mL SOLN injection 1 mL (1,000 Units total) by Dialysis route as needed (in dialysis). 01/19/15   Simonne Martinet, NP  hydrALAZINE (APRESOLINE) 20 MG/ML injection Inject 0.5-2 mLs (10-40 mg total) into the vein every  4 (four) hours as needed (To maintain SBP < 170 mmHg). 01/19/15   Simonne Martinet, NP  insulin aspart (NOVOLOG) 100 UNIT/ML injection Inject 0-20 Units into the skin every 4 (four) hours. 01/19/15   Simonne Martinet, NP  insulin glargine (LANTUS) 100 UNIT/ML injection Inject 0.15 mLs (15 Units total) into the skin 2 (two) times daily. 01/19/15   Simonne Martinet, NP  lip balm (CARMEX) ointment Apply topically as needed for lip care. 01/19/15   Simonne Martinet, NP  LORazepam (ATIVAN) 2 MG/ML injection Inject 0.25-0.5 mLs (0.5-1 mg total) into the vein every 4 (four) hours as needed for anxiety. 01/19/15   Simonne Martinet, NP  Nutritional  Supplements (FEEDING SUPPLEMENT, VITAL HIGH PROTEIN,) LIQD liquid Place 1,000 mLs into feeding tube daily. 01/19/15   Simonne Martinet, NP  ondansetron [facility]) 4 MG/2ML SOLN injection Inject 2 mLs (4 mg total) into the vein every 6 (six) hours as needed for nausea. 01/19/15   Simonne Martinet, NP  oxyCODONE-acetaminophen (ROXICET) 5-325 MG per tablet Take 1-2 tablets by mouth every 4 (four) hours as needed for severe pain. 12/28/14   Bryson L Stilwell, PA-C  sodium chloride 0.9 % infusion kvo 01/19/15   Simonne Martinet, NP  sodium chloride 0.9 % injection 10-40 mLs by Intracatheter route as needed (flush). 01/19/15   Simonne Martinet, NP   No Known Allergies  FAMILY HISTORY:  indicated that his mother is deceased. He indicated that his father is deceased. He indicated that his sister is alive. He indicated that only one of his two brothers is alive.    SOCIAL HISTORY:  reports that he has been smoking.  He does not have any smokeless tobacco history on file. He reports that he does not drink alcohol or use illicit drugs.  REVIEW OF SYSTEMS: Unable to complete as patient is on mechanical ventilation.    SUBJECTIVE:   VITAL SIGNS: Temp:  [99 F (37.2 C)-99.5 F (37.5 C)] 99.1 F (37.3 C) (05/14 1937) Pulse Rate:  [79-100] 95 (05/15 0600) Resp:  [13-20] 17 (05/15 0600) BP: (84-165)/(22-88) 129/88 mmHg (05/15 0600) SpO2:  [93 %-99 %] 97 % (05/15 0600) FiO2 (%):  [35 %-40 %] 40 % (05/15 0600)   HEMODYNAMICS:     VENTILATOR SETTINGS: Vent Mode:  [-] PRVC FiO2 (%):  [35 %-40 %] 40 % Set Rate:  [16 bmp] 16 bmp Vt Set:  [600 mL] 600 mL PEEP:  [5 cmH20] 5 cmH20 Pressure Support:  [5 cmH20] 5 cmH20 Plateau Pressure:  [15 cmH20] 15 cmH20   INTAKE / OUTPUT: No intake or output data in the 24 hours ending 01/20/15 0609  PHYSICAL EXAMINATION: General:  Obese adult male in NAD on vent  Neuro:  sedate HEENT:  OETT & trach remain in position  Cardiovascular:  s1s2 rrr, no m/r/g  Lungs:   resp's even/non-labored, diminished on R Abdomen:  Obese /soft, bsx4 active Musculoskeletal:  No acute deformities, R arm in sling  Skin:  Warm/dry, no edema   LABS:  CBC  Recent Labs Lab 01/17/15 0500 01/18/15 0455 01/19/15 0521  WBC 13.0* 12.5* 12.8*  HGB 11.1* 11.4* 11.4*  HCT 34.5* 35.9* 36.9*  PLT 281 263 238   Coag's No results for input(s): APTT, INR in the last 168 hours.   BMET  Recent Labs Lab 01/17/15 0500 01/18/15 0455 01/19/15 0521  NA 140 141 141  K 5.5* 5.5* 5.2*  CL 103 99* 97*  CO2 24  27 27  BUN 108* 105* 133*  CREATININE 4.78* 4.90* 5.14*  GLUCOSE 172* 168* 165*   Electrolytes  Recent Labs Lab 01/17/15 0500 01/18/15 0455 01/19/15 0521  CALCIUM 8.7* 9.1 9.1  PHOS 10.4* 11.7* 11.4*   Sepsis Markers No results for input(s): LATICACIDVEN, PROCALCITON, O2SATVEN in the last 168 hours.   ABG  Recent Labs Lab 01/13/15 1746 01/13/15 1935 01/13/15 2100  PHART 7.195* 7.256* 7.367  PCO2ART 63.2* 47.9* 35.9  PO2ART 166.0* 83.6 94.9   Liver Enzymes  Recent Labs Lab 01/17/15 0500 01/18/15 0455 01/19/15 0521  ALBUMIN 2.7* 2.8* 2.8*   Cardiac Enzymes No results for input(s): TROPONINI, PROBNP in the last 168 hours.   Glucose  Recent Labs Lab 01/18/15 2329 01/19/15 0335 01/19/15 0834 01/19/15 1247 01/19/15 1605 01/19/15 1935  GLUCAP 120* 143* 144* 147* 134* 150*    Imaging Dg Chest Port 1 View  01/19/2015   CLINICAL DATA:  Tracheostomy patient. Respiratory failure. New admission for baseline.  EXAM: PORTABLE CHEST - 1 VIEW  COMPARISON:  01/19/2015  FINDINGS: Shallow inspiration. Cardiac enlargement with mild vascular congestion. Mild developing interstitial changes suggesting developing mild interstitial edema. No focal consolidation. No blunting of costophrenic angles. No pneumothorax. Tracheostomy tube, bilateral central venous catheters, and enteric tube are unchanged in position.  IMPRESSION: Cardiac enlargement with  pulmonary vascular congestion and suggestion of developing interstitial edema. Appliances are unchanged in position.   Electronically Signed   By: Burman NievesWilliam  Stevens M.D.   On: 01/19/2015 23:54   Dg Chest Port 1 View  01/19/2015   CLINICAL DATA:  Tracheostomy tube placement  EXAM: PORTABLE CHEST - 1 VIEW  COMPARISON:  01/16/2015  FINDINGS: Tracheostomy tube, NG tube, left PICC are stable. Right internal jugular dialysis catheter has been placed. Tip is at the cavoatrial junction. The heart is moderately enlarged. Vascular congestion is stable without pulmonary edema. Bibasilar atelectasis resolved. No pneumothorax. Prominent mediastinum is unchanged.  IMPRESSION: Dialysis catheter placed.  Vascular congestion without edema.   Electronically Signed   By: Jolaine ClickArthur  Hoss M.D.   On: 01/19/2015 08:17   Dg Abd Portable 1v  01/19/2015   CLINICAL DATA:  NG tube placement  EXAM: PORTABLE ABDOMEN - 1 VIEW  COMPARISON:  01/17/2015  FINDINGS: Enteric tube tip is in the right upper quadrant consistent with location in the distal stomach. Interval placement of an inferior vena caval filter. Calcifications demonstrated previously over the kidneys are vaguely identified today. Examination is limited due to artifact of motion and underpenetration.  IMPRESSION: Enteric tube tip localizes over the distal stomach.   Electronically Signed   By: Burman NievesWilliam  Stevens M.D.   On: 01/19/2015 23:19     ASSESSMENT / PLAN:  PULMONARY OETT 5/15 >>>  Trach 5/11 [facility] >> A: Acute on Chronic Hypoxic / Hypercapnic Respiratory Failure Tracheostomy Dislodgement Prior Bleeding from Tracheostomy s/p Revision of trach secondary to hematoma   R Pneumothorax (5/15)  Pneumomediastinum  Presumed OSA / OHS Tobacco Abuse  P:   MV support, 8 cc/kg Assess ABG now No weaning for now until trach issues sorted out No restart of heparin at this time, will need to discuss with Dr. Lazarus SalinesWolicki prior to initiation of anticoagulation  Repeat CXR at  0800 to reassess pneumomediastinum.  May need CT chest.  May need CT placement / CVTS consult  ENT to evaluate trach 5/15  CARDIOVASCULAR CVL A:  Hx of HTN, chronic dCHF, HLD H/O chronic diastolic heart failure Extensive BLE DVT s/p IVC Filter placement  P:  ICU admit, monitoring  Holding ASA Holding heparin - discussed with Dr Lazarus Salines. He would be comfortable with resumption of anticoagulation early to mid part of next week (around 5/20) if no more bleeding. May be able to resume heparin and bridge to coumadin in future. This will depend of no further bleeding issues. If so will need IR to retract IVC filter.    RENAL Tunneled HD Cath 5/13 >>  A:  AKI 2nd to ATN Scrotal mass - AFP and beta HCG normal P:  Monitor BMET intermittently Monitor I/Os Correct electrolytes as indicated HD per Renal When recovers sufficiently from Se[place] illness, consider Urology eval for scrotal mass  GASTROINTESTINAL A:   High GRVs - recurrent Constipation, resolved P:  SUP: enteral famotidine Resume TFs after trach /  Cont EES as motility agent (started 5/09)  HEMATOLOGIC A:   Polycythemia - likely chronic hypoxemia. Resolved Anemia - ICU acquired + acute blood loss with tracheostomy bleeding P:  DVT px: Heparin on hold. IVC filter to be placed Monitor CBC intermittently Transfuse per usual ICU guidelines Transition to warfarin when feasible - likely needs lifelong given extent of clot  INFECTIOUS A:  No acute issues  P:   Monitor temp, WBC count Cont to monitor off Abx  ENDOCRINE A:  Hyperglycemia without prior documentation of DM P:  Sensitive SSI  May need lantus restarted once TF resumed  NEUROLOGIC A:  Acute encephalopathy Post op pain ICU associated discomfort/agitation Severe deconditioning P:  RASS goal 0 to -1 Cont PRN fent, PRN lorazepam  ORTHO A: Rt shoulder surgery 4/22 P: Post op mgmt per Ortho  Continue ROM of RUL Will need to call  Dr. Thomasena Edis for PT recommendations when patient able to tolerate PT   FAMILY  - Updates: No family available at time of readmit.     Canary Brim, NP-C [facility] Pgr: [phone] or [phone]  01/20/2015, 6:09 AM  Attending Note:  I have examined patient, reviewed labs, studies and notes. I have discussed the case with B Ollis, and I agree with the data and plans as amended above. Pt well known to our service, was just transferred to [facility] to continue recovery from complicated hospitalization. He was placed on MV for reasons unclear to me, then after being moved in be would not ventilate. Janina Mayo was dislodged. He was intubated via OP, moved back to [facility] ICU. On eval he is sedated and ventilated. Janina Mayo is still sitting at the edge of the stoma. He has pneumomediastinum and a small L pneumothorax. We will remove the trach if possible, otherwise leave it for ENT. They have been consulted to assist with trach replacement. We will follow CXR, place R chest tube if PTX increasing. Independent critical care time is 60 minutes.   Levy Pupa, MD, PhD 01/20/2015, 8:33 AM [facility] [phone] or if no answer [phone]

## 2015-01-21 ENCOUNTER — Encounter (HOSPITAL_COMMUNITY): Payer: Self-pay | Admitting: Otolaryngology

## 2015-01-21 ENCOUNTER — Inpatient Hospital Stay (HOSPITAL_COMMUNITY): Payer: Worker's Compensation

## 2015-01-21 ENCOUNTER — Encounter: Payer: Self-pay | Admitting: Physician Assistant

## 2015-01-21 DIAGNOSIS — Z930 Tracheostomy status: Secondary | ICD-10-CM

## 2015-01-21 LAB — GLUCOSE, CAPILLARY
GLUCOSE-CAPILLARY: 132 mg/dL — AB (ref 65–99)
GLUCOSE-CAPILLARY: 135 mg/dL — AB (ref 65–99)
Glucose-Capillary: 113 mg/dL — ABNORMAL HIGH (ref 65–99)
Glucose-Capillary: 121 mg/dL — ABNORMAL HIGH (ref 65–99)
Glucose-Capillary: 135 mg/dL — ABNORMAL HIGH (ref 65–99)
Glucose-Capillary: 122 mg/dL — ABNORMAL HIGH (ref 65–99)

## 2015-01-21 LAB — CBC
HCT: 36.2 % — ABNORMAL LOW (ref 39.0–52.0)
HEMOGLOBIN: 11.6 g/dL — AB (ref 13.0–17.0)
MCH: 30.9 pg (ref 26.0–34.0)
MCHC: 32 g/dL (ref 30.0–36.0)
MCV: 96.3 fL (ref 78.0–100.0)
Platelets: 204 10*3/uL (ref 150–400)
RBC: 3.76 MIL/uL — ABNORMAL LOW (ref 4.22–5.81)
RDW: 16.1 % — AB (ref 11.5–15.5)
WBC: 13 10*3/uL — AB (ref 4.0–10.5)

## 2015-01-21 LAB — BASIC METABOLIC PANEL
ANION GAP: 16 — AB (ref 5–15)
BUN: 138 mg/dL — ABNORMAL HIGH (ref 6–20)
CO2: 26 mmol/L (ref 22–32)
CREATININE: 4.98 mg/dL — AB (ref 0.61–1.24)
Calcium: 9.3 mg/dL (ref 8.9–10.3)
Chloride: 99 mmol/L — ABNORMAL LOW (ref 101–111)
GFR calc Af Amer: 13 mL/min — ABNORMAL LOW (ref >60)
GFR, EST NON AFRICAN AMERICAN: 11 mL/min — AB (ref >60)
Glucose, Bld: 122 mg/dL — ABNORMAL HIGH (ref 65–99)
Potassium: 5.1 mmol/L (ref 3.5–5.1)
Sodium: 141 mmol/L (ref 135–145)

## 2015-01-21 LAB — POCT I-STAT 3, ART BLOOD GAS (G3+)
Bicarbonate: 23.9 mEq/L (ref 20.0–24.0)
O2 Saturation: 97 %
PH ART: 7.413 (ref 7.350–7.450)
TCO2: 25 mmol/L (ref 0–100)
pCO2 arterial: 37.6 mmHg (ref 35.0–45.0)
pO2, Arterial: 89 mmHg (ref 80.0–100.0)

## 2015-01-21 LAB — PHOSPHORUS
PHOSPHORUS: 9.5 mg/dL — AB (ref 2.5–4.6)
PHOSPHORUS: 9.8 mg/dL — AB (ref 2.5–4.6)

## 2015-01-21 LAB — MAGNESIUM
Magnesium: 2.9 mg/dL — ABNORMAL HIGH (ref 1.7–2.4)
Magnesium: 3 mg/dL — ABNORMAL HIGH (ref 1.7–2.4)

## 2015-01-21 MED ORDER — PRO-STAT SUGAR FREE PO LIQD
30.0000 mL | Freq: Two times a day (BID) | ORAL | Status: DC
  Filled 2015-01-21 (×2): qty 30

## 2015-01-21 MED ORDER — HYDRALAZINE HCL 20 MG/ML IJ SOLN: 10.0000 mg | INTRAMUSCULAR | Status: DC | PRN

## 2015-01-21 MED ORDER — CHLORHEXIDINE GLUCONATE 4 % EX LIQD
1.0000 "application " | Freq: Once | CUTANEOUS | Status: AC
  Administered 2015-01-22: 1 via TOPICAL
  Filled 2015-01-21: qty 15

## 2015-01-21 MED ORDER — PRO-STAT SUGAR FREE PO LIQD
60.0000 mL | Freq: Every day | ORAL | Status: DC
  Administered 2015-01-21 (×2): 60 mL
  Filled 2015-01-21 (×8): qty 60

## 2015-01-21 MED ORDER — CHLORHEXIDINE GLUCONATE 4 % EX LIQD
1.0000 "application " | Freq: Once | CUTANEOUS | Status: AC
  Administered 2015-01-21: 1 via TOPICAL
  Filled 2015-01-21: qty 15

## 2015-01-21 MED ORDER — DEXTROSE 5 % IV SOLN
3.0000 g | INTRAVENOUS | Status: AC
  Administered 2015-01-22: 3 g via INTRAVENOUS
  Filled 2015-01-21 (×2): qty 3000

## 2015-01-21 MED ORDER — VITAL HIGH PROTEIN PO LIQD
1000.0000 mL | ORAL | Status: DC
  Filled 2015-01-21 (×2): qty 1000

## 2015-01-21 MED ORDER — NEPRO/CARBSTEADY PO LIQD
1000.0000 mL | ORAL | Status: DC
  Administered 2015-01-21: 1000 mL via ORAL
  Filled 2015-01-21 (×3): qty 1000

## 2015-01-21 NOTE — Progress Notes (Signed)
Utilization review completed. Kataya Guimont, RN, BSN. 

## 2015-01-21 NOTE — Progress Notes (Signed)
Subjective: Interval History: has no complaint , sedated.  .  Objective: Vital signs in last 24 hours: Temp:  [99.3 F (37.4 C)-101.3 F (38.5 C)] 101.1 F (38.4 C) (05/16 0408) Pulse Rate:  [91-107] 96 (05/16 0600) Resp:  [18-26] 21 (05/16 0600) BP: (99-158)/(61-99) 142/76 mmHg (05/16 0600) SpO2:  [96 %-100 %] 98 % (05/16 0600) FiO2 (%):  [40 %] 40 % (05/16 0600) Weight:  [139 kg (306 lb 7 oz)] 139 kg (306 lb 7 oz) (05/16 0408) Weight change: -1 kg (-2 lb 3.3 oz)  Intake/Output from previous day: 05/15 0701 - 05/16 0700 In: 520 [I.V.:330; NG/GT:190] Out: 1115 [Urine:1115] Intake/Output this shift: Total I/O In: 230 [I.V.:110; NG/GT:120] Out: 530 [Urine:530]  General appearance: morbidly obese and sedated on vent, not coop Resp: diminished breath sounds bilaterally and rales bibasilar Cardio: S1, S2 normal and systolic murmur: holosystolic 2/6, blowing at apex GI: obese, pos bs, liver down 6 cm Extremities: edema 2-3+  Lab Results:  Recent Labs  01/20/15 0650 01/21/15 0350  WBC 14.3* 13.0*  HGB 11.6* 11.6*  HCT 36.7* 36.2*  PLT 194 204   BMET:  Recent Labs  01/20/15 0650 01/21/15 0350  NA 141 141  K 5.2* 5.1  CL 97* 99*  CO2 26 26  GLUCOSE 162* 122*  BUN 102* 138*  CREATININE 4.01* 4.98*  CALCIUM 9.0 9.3   No results for input(s): PTH in the last 72 hours. Iron Studies: No results for input(s): IRON, TIBC, TRANSFERRIN, FERRITIN in the last 72 hours.  Studies/Results: Dg Chest Port 1 View  01/20/2015   CLINICAL DATA:  Pneumomediastinum.  EXAM: PORTABLE CHEST - 1 VIEW  COMPARISON:  Radiograph 01/20/2015  FINDINGS: Tracheostomy tube, feeding tube, and right venous line are unchanged. Mild decrease in pneumomediastinum. There is some decrease in the subcutaneous gas. Left basilar atelectasis and central venous congestion are unchanged.  Interval decrease in the right pneumothorax.  IMPRESSION: 1. Stable support apparatus. 2. Interval decrease in  pneumomediastinum and left chest wall gas. 3. Interval decrease in right pneumothorax.   Electronically Signed   By: Genevive BiStewart  Edmunds M.D.   On: 01/20/2015 09:47   Dg Chest Port 1 View  01/20/2015   CLINICAL DATA:  Endotracheal tube placed.  EXAM: PORTABLE CHEST - 1 VIEW  COMPARISON:  01/19/2015  FINDINGS: Pre-existing tracheostomy. Endotracheal tube also placed with tip measuring 4 cm above the carina. Enteric tube tip is off the field of view but below the left hemidiaphragm. Right central venous catheter tip over the cavoatrial junction region. Left PICC catheter with tip over the mid SVC region. Shallow inspiration. Since the previous study, there is interval development of diffuse subcutaneous emphysema in the left chest and extending over to the right chest. There is interval development of pneumo mediastinum. There is interval development of a right pneumothorax. Probable atelectasis in the lung bases.  IMPRESSION: Endotracheal tube was placed along with pre-existing tracheostomy. Interval development of subcutaneous emphysema, pneumomediastinum, and right pneumothorax.  These results were called by telephone at the time of interpretation on 01/20/2015 at 3:56 am to Briah, the patient's nurse at the [facility] who verbally acknowledged these results.   Electronically Signed   By: Burman NievesWilliam  Stevens M.D.   On: 01/20/2015 04:00   Dg Chest Port 1 View  01/19/2015   CLINICAL DATA:  Tracheostomy patient. Respiratory failure. New admission for baseline.  EXAM: PORTABLE CHEST - 1 VIEW  COMPARISON:  01/19/2015  FINDINGS: Shallow inspiration. Cardiac enlargement with mild  vascular congestion. Mild developing interstitial changes suggesting developing mild interstitial edema. No focal consolidation. No blunting of costophrenic angles. No pneumothorax. Tracheostomy tube, bilateral central venous catheters, and enteric tube are unchanged in position.  IMPRESSION: Cardiac enlargement with pulmonary  vascular congestion and suggestion of developing interstitial edema. Appliances are unchanged in position.   Electronically Signed   By: Burman NievesWilliam  Stevens M.D.   On: 01/19/2015 23:54   Dg Abd Portable 1v  01/19/2015   CLINICAL DATA:  NG tube placement  EXAM: PORTABLE ABDOMEN - 1 VIEW  COMPARISON:  01/17/2015  FINDINGS: Enteric tube tip is in the right upper quadrant consistent with location in the distal stomach. Interval placement of an inferior vena caval filter. Calcifications demonstrated previously over the kidneys are vaguely identified today. Examination is limited due to artifact of motion and underpenetration.  IMPRESSION: Enteric tube tip localizes over the distal stomach.   Electronically Signed   By: Burman NievesWilliam  Stevens M.D.   On: 01/19/2015 23:19    I have reviewed the patient's [place] medications.  Assessment/Plan: 1 AKI nonoliguric. Neg balance, minimal rise solute.  Hold off HD and allow diuresis.  May be early recovery. 2 OHS  Per CCM 3 Resp failure #2 /sepsis/Pneu/ 4 Pneumomediastinum per CCM. 5 DVT s/p filter 6 shoulder repair P hold off HD, follow urine, minimize ivf.  , vent,     LOS: 1 day   [name],[name] 01/21/2015,7:00 AM

## 2015-01-21 NOTE — H&P (Signed)
PULMONARY / CRITICAL CARE MEDICINE   Name: [name] MRN: [id number] DOB: 03/27/1951    ADMISSION DATE:  01/20/2015  [facility]   CHIEF COMPLAINT:  Tracheostomy Dislodgement  INITIAL PRESENTATION: [name] initially admitted 4/22-5/14 for R rotator cuff repair by Ortho.  Failed extubation post-op and reintubated 4/23.  Post operative course complicated by prolonged respiratory respiratory failure, findings of testicular masses, acute renal failure requiring CVVHD, BLE DVT, and tracheostomy bleeding s/p revision.  [facility] and tracheostomy became dislodged, [facility]15 early am.    STUDIES:  4/23 US scrotum: Two solid extratesticular right scrotal masses. Primary differential diagnosis for extratesticular masses include primary or metastatic neoplasms including adenomatoid tumor, fibroma, leiomyoma, mesothelioma, or sarcoma. Urology consultation is recommended  4/26 TEE: LVEF 60-65%, Grade 1 DD, trivial pericardial effusion 4/27 US testes with masses 5/02 renal US: no hydronephrosis, Possible nonobstructing stone lower pole left kidney  SIGNIFICANT EVENTS: 4/22 - 5/14  Admit for R rotator cuff repair, complicated course with prolonged resp fx, renal fx, DVT, testicular masses  SUBJECTIVE:  FEVER overnight, CULTURED this morning, ENT plans to take BACK TO OR   VITAL SIGNS: Temp:  [99.3 F (37.4 C)-101.3 F (38.5 C)] 100.1 F (37.8 C) (05/16 0700) Pulse Rate:  [91-107] 96 (05/16 0900) Resp:  [18-26] 22 (05/16 0900) BP: (97-158)/(47-99) 133/79 mmHg (05/16 0900) SpO2:  [95 %-100 %] 95 % (05/16 0900) FiO2 (%):  [40 %] 40 % (05/16 0800) Weight:  [139 kg (306 lb 7 oz)] 139 kg (306 lb 7 oz) (05/16 0408)   HEMODYNAMICS:     VENTILATOR SETTINGS: Vent Mode:  [-] PRVC FiO2 (%):  [40 %] 40 % Set Rate:  [16 bmp] 16 bmp Vt Set:  [600 mL] 600 mL PEEP:  [5 cmH20] 5 cmH20 Plateau Pressure:  [9 cmH20-21 cmH20] 21 cmH20   INTAKE /  OUTPUT:  Intake/Output Summary (Last 24 hours) at 01/21/15 1018 Last data filed at 01/21/15 0900  Gross per 24 hour  Intake    400 ml  Output    960 ml  Net   -560 ml    PHYSICAL EXAMINATION:  Gen: sedated on vent HENT: NCAT EOMi, ETT in place PULM: few rhonchi bilaterally, otherwise clear, vent supported breaths CV: RRR, no mgr GI: BS+, soft, nontender MSK: R shoulder without redness, swelling, sling in place Neuro: stirs to touch  LABS:  CBC  Recent Labs Lab 01/19/15 0521 01/20/15 0650 01/21/15 0350  WBC 12.8* 14.3* 13.0*  HGB 11.4* 11.6* 11.6*  HCT 36.9* 36.7* 36.2*  PLT 238 194 204   Coag's No results for input(s): APTT, INR in the last 168 hours.   BMET  Recent Labs Lab 01/19/15 0521 01/20/15 0650 01/21/15 0350  NA 141 141 141  K 5.2* 5.2* 5.1  CL 97* 97* 99*  CO2 27 26 26   BUN 133* 102* 138*  CREATININE 5.14* 4.01* 4.98*  GLUCOSE 165* 162* 122*   Electrolytes  Recent Labs Lab 01/17/15 0500 01/18/15 0455 01/19/15 0521 01/20/15 0650 01/21/15 0350  CALCIUM 8.7* 9.1 9.1 9.0 9.3  PHOS 10.4* 11.7* 11.4*  --   --    Sepsis Markers No results for input(s): LATICACIDVEN, PROCALCITON, O2SATVEN in the last 168 hours.   ABG  Recent Labs Lab 01/21/15 0401  PHART 7.413  PCO2ART 37.6  PO2ART 89.0   Liver Enzymes  Recent Labs Lab 01/17/15 0500 01/18/15 0455 01/19/15 0521  ALBUMIN  2.7* 2.8* 2.8*   Cardiac Enzymes No results for input(s): TROPONINI, PROBNP in the last 168 hours.   Glucose  Recent Labs Lab 01/20/15 1236 01/20/15 1553 01/20/15 1956 01/20/15 2352 01/21/15 0408 01/21/15 0717  GLUCAP 138* 133* 129* 132* 113* 121*    Imaging 5/16 CXR images reviewed> small pneumothorax right lung apex nearly completely resolved, ETT in place, no infiltrate   ASSESSMENT / PLAN:  PULMONARY OETT 5/15 >>>  Trach 5/11 [facility] >> A: Acute on Chronic Hypoxic / Hypercapnic Respiratory Failure > appears back to baseline but needs  tracheostomy replaced given high risk of respiratory failure Tracheostomy Dislodgement R Pneumothorax (5/15) > nearly completely resolved Presumed OSA / OHS Tobacco Abuse  P:   Continue full vent support Plan replace tracheostomy this week pulm toilette No intervention needed for pneumothorax, follow CXR daily  CARDIOVASCULAR CVL A:  Hx of HTN, chronic dCHF, HLD H/O chronic diastolic heart failure Extensive BLE DVT s/p IVC Filter placement  P:  Holding heparin given bleeding from tracheostomy last week Plan to restart anticoagulation if no bleeding after re-do tracheostomy Would plan to remove IVC filter if tolerating anticoagulation   Restart prn hydralazine  RENAL Tunneled HD Cath 5/13 >>  A:  AKI 2nd to ATN Scrotal mass - AFP and beta HCG normal P:  Monitor BMET intermittently Monitor I/Os Correct electrolytes as indicated HD per Renal When recovers sufficiently from Rasor illness, consider Urology eval for scrotal mass  GASTROINTESTINAL A:   High GRVs - recurrent Constipation, resolved P:  SUP: enteral famotidine Resume TFs now Cont EES as motility agent (started 5/09)  HEMATOLOGIC A:   Polycythemia - likely chronic hypoxemia. Resolved Anemia - ICU acquired + acute blood loss with tracheostomy bleeding > not actively bleeding P:  DVT px: Heparin on hold.  Maintain IVC filter until tolerating anticoagulation, then remove Monitor CBC intermittently Transfuse per usual ICU guidelines  INFECTIOUS A:  Fever 5/16, no clear source P:   Monitor temp, WBC count Culture, hold Abx  5/16 blood cx >> 5/16 resp cx >>  ENDOCRINE A:  Hyperglycemia without prior documentation of DM P:  Sensitive SSI  May need lantus restarted once TF resumed  NEUROLOGIC A:  Acute encephalopathy Post op pain ICU associated discomfort/agitation Severe deconditioning P:  RASS goal 0 to -1 Cont PRN fent, PRN lorazepam  ORTHO A: Rt shoulder surgery  4/22 P: Post op mgmt per Ortho  Continue ROM of RUL Resume PT post tracheostomy   FAMILY  - Updates: No family available at time 5/16  Independent critical care time is 40 minutes.   Heber CarolinaBrent McQuaid, MD [facility] Pager: [phone] Cell: [phone] If no response, call [phone]

## 2015-01-21 NOTE — Progress Notes (Signed)
Obtained sputum sample and sent down to main lab without any complications.  Will continue to monitor.

## 2015-01-21 NOTE — Progress Notes (Addendum)
Initial Nutrition Assessment  DOCUMENTATION CODES:  Morbid obesity  INTERVENTION:  Tube feeding, Prostat   Initiate TF via OGT with Nepro at goal rate of 20 ml/h (480 ml per day) and Prostat 60 ml 5 times per day to provide 1864 kcals, 189 gm protein, 349 ml free water daily.   NUTRITION DIAGNOSIS:  Inadequate oral intake related to inability to eat as evidenced by NPO status.   GOAL:  Provide needs based on ASPEN/SCCM guidelines   MONITOR:  Vent status, Labs, Weight trends, TF tolerance, Skin  REASON FOR ASSESSMENT:  Consult Enteral/tube feeding initiation and management  ASSESSMENT:  [facility]M [facility] for right rotator cuff repair. Found testicular masses and acute renal failure requiring CRRT, and required a tracheostomy. Now requiring intermittent HD.   Labs reviewed: phosphorus and magnesium elevated. Plans to replace trach Tuesday or Wednesday.  Patient is currently intubated on ventilator support MV: 15 L/min Temp (24hrs), Avg:100.4 F (38 C), Min:99.3 F (37.4 C), Max:101.3 F (38.5 C)  Propofol: none   Height:  Ht Readings from Last 1 Encounters:  01/20/15 5\' 11"  (1.803 m)    Weight:  Wt Readings from Last 1 Encounters:  01/21/15 306 lb 7 oz (139 kg)    Ideal Body Weight:  78.2 kg  Wt Readings from Last 10 Encounters:  01/21/15 306 lb 7 oz (139 kg)  01/19/15 326 lb 4.5 oz (148 kg)  11/29/14 360 lb 3.2 oz (163.386 kg)    BMI:  Body mass index is 42.76 kg/(m^2).  Estimated Nutritional Needs:  Kcal:  [id number]  Protein:  196 gm  Fluid:  1.2 L  Skin:   stage 2 pressure ulcer to buttocks; wound to right scrotum  Diet Order:  Diet NPO time specified  EDUCATION NEEDS:  Education needs no appropriate at this time   Intake/Output Summary (Last 24 hours) at 01/21/15 1205 Last data filed at 01/21/15 1200  Gross per  24 hour  Intake    430 ml  Output   1030 ml  Net   -600 ml    Last BM:  5/15   Joaquin CourtsKimberly Vivi Piccirilli, RD, LDN, CNSC Pager [phone] After Hours Pager [phone]

## 2015-01-21 NOTE — Progress Notes (Signed)
01/21/2015 9:25 AM  [name], [name] [id number]  Post-Op Day 5    Temp:  [99.3 F (37.4 C)-101.3 F (38.5 C)] 100.1 F (37.8 C) (05/16 0700) Pulse Rate:  [91-107] 96 (05/16 0900) Resp:  [18-26] 22 (05/16 0900) BP: (97-158)/(47-99) 133/79 mmHg (05/16 0900) SpO2:  [95 %-100 %] 95 % (05/16 0900) FiO2 (%):  [40 %] 40 % (05/16 0800) Weight:  [139 kg (306 lb 7 oz)] 139 kg (306 lb 7 oz) (05/16 0408),     Intake/Output Summary (Last 24 hours) at 01/21/15 0925 Last data filed at 01/21/15 0900  Gross per 24 hour  Intake    430 ml  Output    990 ml  Net   -560 ml    Results for orders placed or performed during the hospital encounter of 01/20/15 (from the past 24 hour(s))  Glucose, capillary     Status: Abnormal   Collection Time: 01/20/15 12:36 PM  Result Value Ref Range   Glucose-Capillary 138 (H) 65 - 99 mg/dL  Glucose, capillary     Status: Abnormal   Collection Time: 01/20/15  3:53 PM  Result Value Ref Range   Glucose-Capillary 133 (H) 65 - 99 mg/dL  Glucose, capillary     Status: Abnormal   Collection Time: 01/20/15  7:56 PM  Result Value Ref Range   Glucose-Capillary 129 (H) 65 - 99 mg/dL   Comment 1 Notify RN   Glucose, capillary     Status: Abnormal   Collection Time: 01/20/15 11:52 PM  Result Value Ref Range   Glucose-Capillary 132 (H) 65 - 99 mg/dL   Comment 1 Notify RN   CBC     Status: Abnormal   Collection Time: 01/21/15  3:50 AM  Result Value Ref Range   WBC 13.0 (H) 4.0 - 10.5 K/uL   RBC 3.76 (L) 4.22 - 5.81 MIL/uL   Hemoglobin 11.6 (L) 13.0 - 17.0 g/dL   HCT 40.936.2 (L) 81.139.0 - 91.452.0 %   MCV 96.3 78.0 - 100.0 fL   MCH 30.9 26.0 - 34.0 pg   MCHC 32.0 30.0 - 36.0 g/dL   RDW 78.216.1 (H) 95.611.5 - 21.315.5 %   Platelets 204 150 - 400 K/uL  Basic metabolic panel     Status: Abnormal   Collection Time: 01/21/15  3:50 AM  Result Value Ref Range   Sodium 141 135 - 145 mmol/L   Potassium 5.1 3.5 - 5.1 mmol/L   Chloride 99 (L) 101 - 111 mmol/L   CO2 26 22 - 32 mmol/L   Glucose, Bld 122 (H) 65 - 99 mg/dL   BUN 086138 (H) 6 - 20 mg/dL   Creatinine, Ser 5.784.98 (H) 0.61 - 1.24 mg/dL   Calcium 9.3 8.9 - 46.910.3 mg/dL   GFR calc non Af Amer 11 (L) >60 mL/min   GFR calc Af Amer 13 (L) >60 mL/min   Anion gap 16 (H) 5 - 15  I-STAT 3, arterial blood gas (G3+)     Status: None   Collection Time: 01/21/15  4:01 AM  Result Value Ref Range   pH, Arterial 7.413 7.350 - 7.450   pCO2 arterial 37.6 35.0 - 45.0 mmHg   pO2, Arterial 89.0 80.0 - 100.0 mmHg   Bicarbonate 23.9 20.0 - 24.0 mEq/L   TCO2 25 0 - 100 mmol/L   O2 Saturation 97.0 %   Patient temperature 99.3 F    Collection site RADIAL, ALLEN'S TEST ACCEPTABLE    Drawn by Designer, television/film setperator  Sample type ARTERIAL   Glucose, capillary     Status: Abnormal   Collection Time: 01/21/15  4:08 AM  Result Value Ref Range   Glucose-Capillary 113 (H) 65 - 99 mg/dL  Glucose, capillary     Status: Abnormal   Collection Time: 01/21/15  7:17 AM  Result Value Ref Range   Glucose-Capillary 121 (H) 65 - 99 mg/dL    SUBJECTIVE:  Events reviewed, including trach site bleeding, then trach dislodgement.  OBJECTIVE:  [name], wound dressed.  IMPRESSION:  Satisfactory check  PLAN:  Discussed with Dr. Kendrick FriesMcQuaid.  CCM would prefer that he still have a tracheostomy.  Will plan to replace this tomorrow or Wed.    [name], [name]

## 2015-01-22 ENCOUNTER — Inpatient Hospital Stay (HOSPITAL_COMMUNITY): Payer: Worker's Compensation | Admitting: Anesthesiology

## 2015-01-22 ENCOUNTER — Encounter (HOSPITAL_COMMUNITY)
Admission: RE | Disposition: A | Discharge status: Nursing Home (Includes ICF) | Payer: Self-pay | Source: Ambulatory Visit | Attending: Pulmonary Disease

## 2015-01-22 ENCOUNTER — Inpatient Hospital Stay (HOSPITAL_COMMUNITY): Payer: Worker's Compensation

## 2015-01-22 DIAGNOSIS — J189 Pneumonia, unspecified organism: Secondary | ICD-10-CM

## 2015-01-22 DIAGNOSIS — J9611 Chronic respiratory failure with hypoxia: Secondary | ICD-10-CM | POA: Insufficient documentation

## 2015-01-22 DIAGNOSIS — Z930 Tracheostomy status: Secondary | ICD-10-CM | POA: Insufficient documentation

## 2015-01-22 HISTORY — PX: TRACHEOSTOMY TUBE PLACEMENT: SHX814

## 2015-01-22 LAB — RENAL FUNCTION PANEL
ALBUMIN: 2.7 g/dL — AB (ref 3.5–5.0)
ANION GAP: 19 — AB (ref 5–15)
Albumin: 3 g/dL — ABNORMAL LOW (ref 3.5–5.0)
Anion gap: 20 — ABNORMAL HIGH (ref 5–15)
BUN: 176 mg/dL — ABNORMAL HIGH (ref 6–20)
BUN: 173 mg/dL — ABNORMAL HIGH (ref 6–20)
CHLORIDE: 104 mmol/L (ref 101–111)
CO2: 22 mmol/L (ref 22–32)
CO2: 23 mmol/L (ref 22–32)
CREATININE: 5.58 mg/dL — AB (ref 0.61–1.24)
Calcium: 9.4 mg/dL (ref 8.9–10.3)
Calcium: 8.9 mg/dL (ref 8.9–10.3)
Chloride: 103 mmol/L (ref 101–111)
Creatinine, Ser: 5.66 mg/dL — ABNORMAL HIGH (ref 0.61–1.24)
GFR calc Af Amer: 11 mL/min — ABNORMAL LOW (ref >60)
GFR, EST AFRICAN AMERICAN: 11 mL/min — AB (ref >60)
GFR, EST NON AFRICAN AMERICAN: 10 mL/min — AB (ref >60)
GFR, EST NON AFRICAN AMERICAN: 10 mL/min — AB (ref >60)
Glucose, Bld: 136 mg/dL — ABNORMAL HIGH (ref 65–99)
Glucose, Bld: 133 mg/dL — ABNORMAL HIGH (ref 65–99)
PHOSPHORUS: 10.6 mg/dL — AB (ref 2.5–4.6)
POTASSIUM: 5.1 mmol/L (ref 3.5–5.1)
POTASSIUM: 5 mmol/L (ref 3.5–5.1)
Phosphorus: 11.7 mg/dL — ABNORMAL HIGH (ref 2.5–4.6)
SODIUM: 146 mmol/L — AB (ref 135–145)
Sodium: 145 mmol/L (ref 135–145)

## 2015-01-22 LAB — PROCALCITONIN: Procalcitonin: 3.32 ng/mL

## 2015-01-22 LAB — CBC
HCT: 36.4 % — ABNORMAL LOW (ref 39.0–52.0)
HCT: 36.3 % — ABNORMAL LOW (ref 39.0–52.0)
Hemoglobin: 11.8 g/dL — ABNORMAL LOW (ref 13.0–17.0)
Hemoglobin: 11.3 g/dL — ABNORMAL LOW (ref 13.0–17.0)
MCH: 31.1 pg (ref 26.0–34.0)
MCH: 30.3 pg (ref 26.0–34.0)
MCHC: 32.4 g/dL (ref 30.0–36.0)
MCHC: 31.1 g/dL (ref 30.0–36.0)
MCV: 95.8 fL (ref 78.0–100.0)
MCV: 97.3 fL (ref 78.0–100.0)
Platelets: 195 10*3/uL (ref 150–400)
Platelets: 188 10*3/uL (ref 150–400)
RBC: 3.8 MIL/uL — ABNORMAL LOW (ref 4.22–5.81)
RBC: 3.73 MIL/uL — AB (ref 4.22–5.81)
RDW: 16 % — ABNORMAL HIGH (ref 11.5–15.5)
RDW: 16.1 % — ABNORMAL HIGH (ref 11.5–15.5)
WBC: 15.2 10*3/uL — AB (ref 4.0–10.5)
WBC: 15.3 10*3/uL — ABNORMAL HIGH (ref 4.0–10.5)

## 2015-01-22 LAB — GLUCOSE, CAPILLARY
GLUCOSE-CAPILLARY: 128 mg/dL — AB (ref 65–99)
GLUCOSE-CAPILLARY: 160 mg/dL — AB (ref 65–99)
GLUCOSE-CAPILLARY: 164 mg/dL — AB (ref 65–99)
Glucose-Capillary: 121 mg/dL — ABNORMAL HIGH (ref 65–99)
Glucose-Capillary: 136 mg/dL — ABNORMAL HIGH (ref 65–99)
Glucose-Capillary: 148 mg/dL — ABNORMAL HIGH (ref 65–99)

## 2015-01-22 LAB — MAGNESIUM
Magnesium: 3 mg/dL — ABNORMAL HIGH (ref 1.7–2.4)
Magnesium: 2.3 mg/dL (ref 1.7–2.4)

## 2015-01-22 SURGERY — CREATION, TRACHEOSTOMY
Anesthesia: General | Site: Neck

## 2015-01-22 MED ORDER — LORAZEPAM 2 MG/ML IJ SOLN: 0.5000 mg | INTRAMUSCULAR | Status: DC | PRN

## 2015-01-22 MED ORDER — ERYTHROMYCIN ETHYLSUCCINATE 200 MG/5ML PO SUSR: 400.0000 mg | Freq: Three times a day (TID) | ORAL | Status: DC

## 2015-01-22 MED ORDER — SODIUM CHLORIDE 0.9 % IV SOLN: 100.0000 mL | INTRAVENOUS | Status: DC | PRN

## 2015-01-22 MED ORDER — FENTANYL CITRATE (PF) 100 MCG/2ML IJ SOLN
INTRAMUSCULAR | Status: DC | PRN
  Administered 2015-01-22 (×2): 50 ug via INTRAVENOUS

## 2015-01-22 MED ORDER — LANTHANUM CARBONATE 500 MG PO CHEW
1000.0000 mg | CHEWABLE_TABLET | Freq: Three times a day (TID) | ORAL | Status: DC
  Administered 2015-01-22 – 2015-01-26 (×14): 1000 mg via ORAL
  Filled 2015-01-22 (×19): qty 2

## 2015-01-22 MED ORDER — ROCURONIUM BROMIDE 100 MG/10ML IV SOLN
INTRAVENOUS | Status: DC | PRN
  Administered 2015-01-22: 20 mg via INTRAVENOUS

## 2015-01-22 MED ORDER — FOLIC ACID 1 MG PO TABS: 1.0000 mg | ORAL_TABLET | Freq: Every day | ORAL | Status: DC

## 2015-01-22 MED ORDER — HEPARIN SODIUM (PORCINE) 1000 UNIT/ML DIALYSIS
40.0000 [IU]/kg | Freq: Once | INTRAMUSCULAR | Status: DC
  Filled 2015-01-22: qty 6

## 2015-01-22 MED ORDER — PENTAFLUOROPROP-TETRAFLUOROETH EX AERO: 1.0000 "application " | INHALATION_SPRAY | CUTANEOUS | Status: DC | PRN

## 2015-01-22 MED ORDER — NEPRO/CARBSTEADY PO LIQD
237.0000 mL | ORAL | Status: DC | PRN
  Filled 2015-01-22: qty 237

## 2015-01-22 MED ORDER — FENTANYL CITRATE (PF) 100 MCG/2ML IJ SOLN
25.0000 ug | INTRAMUSCULAR | Status: DC | PRN
  Administered 2015-01-25: 100 ug via INTRAVENOUS
  Administered 2015-01-26 – 2015-01-27 (×4): 50 ug via INTRAVENOUS
  Filled 2015-01-22 (×5): qty 2

## 2015-01-22 MED ORDER — LIDOCAINE-EPINEPHRINE 1 %-1:100000 IJ SOLN
INTRAMUSCULAR | Status: DC | PRN
  Administered 2015-01-22: 20 mL

## 2015-01-22 MED ORDER — ALTEPLASE 2 MG IJ SOLR
2.0000 mg | Freq: Once | INTRAMUSCULAR | Status: AC | PRN
  Filled 2015-01-22: qty 2

## 2015-01-22 MED ORDER — HEPARIN SODIUM (PORCINE) 1000 UNIT/ML DIALYSIS: 1000.0000 [IU] | INTRAMUSCULAR | Status: DC | PRN

## 2015-01-22 MED ORDER — LIDOCAINE-EPINEPHRINE 1 %-1:100000 IJ SOLN
INTRAMUSCULAR | Status: AC
  Filled 2015-01-22: qty 1

## 2015-01-22 MED ORDER — LIP MEDEX EX OINT
TOPICAL_OINTMENT | CUTANEOUS | Status: DC | PRN
  Filled 2015-01-22: qty 7

## 2015-01-22 MED ORDER — MIDAZOLAM HCL 2 MG/2ML IJ SOLN
INTRAMUSCULAR | Status: AC
  Filled 2015-01-22: qty 2

## 2015-01-22 MED ORDER — DEXTROSE 5 % IV SOLN
2.0000 g | Freq: Once | INTRAVENOUS | Status: AC
  Administered 2015-01-22: 2 g via INTRAVENOUS
  Filled 2015-01-22: qty 2

## 2015-01-22 MED ORDER — INSULIN ASPART 100 UNIT/ML ~~LOC~~ SOLN
0.0000 [IU] | SUBCUTANEOUS | Status: DC
  Administered 2015-01-22: 1 [IU] via SUBCUTANEOUS
  Administered 2015-01-22: 2 [IU] via SUBCUTANEOUS
  Administered 2015-01-23: 1 [IU] via SUBCUTANEOUS
  Administered 2015-01-23: 2 [IU] via SUBCUTANEOUS
  Administered 2015-01-23 – 2015-01-24 (×7): 1 [IU] via SUBCUTANEOUS
  Administered 2015-01-24: 2 [IU] via SUBCUTANEOUS
  Administered 2015-01-24: 1 [IU] via SUBCUTANEOUS
  Administered 2015-01-25: 2 [IU] via SUBCUTANEOUS
  Administered 2015-01-25: 1 [IU] via SUBCUTANEOUS
  Administered 2015-01-25: 2 [IU] via SUBCUTANEOUS
  Administered 2015-01-25 – 2015-01-26 (×4): 1 [IU] via SUBCUTANEOUS
  Administered 2015-01-26: 2 [IU] via SUBCUTANEOUS
  Administered 2015-01-26 – 2015-01-27 (×4): 1 [IU] via SUBCUTANEOUS
  Administered 2015-01-27: 0 [IU] via SUBCUTANEOUS
  Administered 2015-01-27 (×3): 1 [IU] via SUBCUTANEOUS
  Administered 2015-01-28: 2 [IU] via SUBCUTANEOUS
  Administered 2015-01-28: 0 [IU] via SUBCUTANEOUS
  Administered 2015-01-28 – 2015-01-29 (×3): 1 [IU] via SUBCUTANEOUS
  Administered 2015-01-29: 2 [IU] via SUBCUTANEOUS

## 2015-01-22 MED ORDER — ACETAMINOPHEN 160 MG/5ML PO SOLN
650.0000 mg | Freq: Four times a day (QID) | ORAL | Status: DC | PRN
  Administered 2015-01-23 – 2015-01-28 (×7): 650 mg
  Filled 2015-01-22 (×7): qty 20.3

## 2015-01-22 MED ORDER — LIDOCAINE-PRILOCAINE 2.5-2.5 % EX CREA
1.0000 "application " | TOPICAL_CREAM | CUTANEOUS | Status: DC | PRN
  Filled 2015-01-22: qty 5

## 2015-01-22 MED ORDER — PRO-STAT SUGAR FREE PO LIQD
60.0000 mL | Freq: Four times a day (QID) | ORAL | Status: DC
  Filled 2015-01-22 (×4): qty 60

## 2015-01-22 MED ORDER — FENTANYL CITRATE (PF) 250 MCG/5ML IJ SOLN
INTRAMUSCULAR | Status: AC
  Filled 2015-01-22: qty 5

## 2015-01-22 MED ORDER — FAMOTIDINE 40 MG/5ML PO SUSR
20.0000 mg | Freq: Every day | ORAL | Status: DC
  Administered 2015-01-22 – 2015-01-29 (×8): 20 mg
  Filled 2015-01-22 (×8): qty 2.5

## 2015-01-22 MED ORDER — HEPARIN SODIUM (PORCINE) 1000 UNIT/ML DIALYSIS
1000.0000 [IU] | INTRAMUSCULAR | Status: DC | PRN
  Filled 2015-01-22: qty 1

## 2015-01-22 MED ORDER — NEPRO/CARBSTEADY PO LIQD
1000.0000 mL | ORAL | Status: DC
  Administered 2015-01-22 – 2015-01-28 (×5): 1000 mL
  Filled 2015-01-22 (×9): qty 1000

## 2015-01-22 MED ORDER — PRO-STAT SUGAR FREE PO LIQD
60.0000 mL | Freq: Every day | ORAL | Status: DC
  Administered 2015-01-22 – 2015-01-28 (×32): 60 mL
  Filled 2015-01-22 (×35): qty 60

## 2015-01-22 MED ORDER — CETYLPYRIDINIUM CHLORIDE 0.05 % MT LIQD: 7.0000 mL | Freq: Four times a day (QID) | OROMUCOSAL | Status: DC

## 2015-01-22 MED ORDER — INSULIN GLARGINE 100 UNIT/ML ~~LOC~~ SOLN
15.0000 [IU] | Freq: Two times a day (BID) | SUBCUTANEOUS | Status: DC
  Filled 2015-01-22 (×2): qty 0.15

## 2015-01-22 MED ORDER — 0.9 % SODIUM CHLORIDE (POUR BTL) OPTIME
TOPICAL | Status: DC | PRN
  Administered 2015-01-22: 1000 mL

## 2015-01-22 MED ORDER — BLISTEX MEDICATED EX OINT
TOPICAL_OINTMENT | CUTANEOUS | Status: DC | PRN
  Filled 2015-01-22: qty 10

## 2015-01-22 MED ORDER — VITAL HIGH PROTEIN PO LIQD
1000.0000 mL | ORAL | Status: DC
  Filled 2015-01-22 (×2): qty 1000

## 2015-01-22 MED ORDER — HYDRALAZINE HCL 20 MG/ML IJ SOLN
10.0000 mg | INTRAMUSCULAR | Status: DC | PRN
  Administered 2015-01-26 – 2015-01-27 (×2): 20 mg via INTRAVENOUS
  Administered 2015-01-28: 10 mg via INTRAVENOUS
  Filled 2015-01-22 (×4): qty 1

## 2015-01-22 MED ORDER — INSULIN ASPART 100 UNIT/ML ~~LOC~~ SOLN
0.0000 [IU] | SUBCUTANEOUS | Status: DC
  Administered 2015-01-22: 2 [IU] via SUBCUTANEOUS

## 2015-01-22 MED ORDER — SODIUM CHLORIDE 0.9 % IV SOLN
2500.0000 mg | Freq: Once | INTRAVENOUS | Status: AC
  Administered 2015-01-22: 2500 mg via INTRAVENOUS
  Filled 2015-01-22: qty 2500

## 2015-01-22 MED ORDER — MIDAZOLAM HCL 5 MG/5ML IJ SOLN
INTRAMUSCULAR | Status: DC | PRN
  Administered 2015-01-22: 2 mg via INTRAVENOUS

## 2015-01-22 MED ORDER — ONDANSETRON HCL 4 MG/2ML IJ SOLN: 4.0000 mg | Freq: Four times a day (QID) | INTRAMUSCULAR | Status: DC | PRN

## 2015-01-22 MED ORDER — LIDOCAINE HCL (PF) 1 % IJ SOLN: 5.0000 mL | INTRAMUSCULAR | Status: DC | PRN

## 2015-01-22 MED ORDER — SODIUM CHLORIDE 0.9 % IV BOLUS (SEPSIS)
500.0000 mL | Freq: Once | INTRAVENOUS | Status: AC
  Administered 2015-01-22: 500 mL via INTRAVENOUS

## 2015-01-22 MED ORDER — CHLORHEXIDINE GLUCONATE 0.12 % MT SOLN: 15.0000 mL | Freq: Two times a day (BID) | OROMUCOSAL | Status: DC

## 2015-01-22 MED ORDER — PROPOFOL 10 MG/ML IV BOLUS
INTRAVENOUS | Status: AC
  Filled 2015-01-22: qty 20

## 2015-01-22 SURGICAL SUPPLY — 35 items
BLADE SURG 15 STRL LF DISP TIS (BLADE) IMPLANT
BLADE SURG 15 STRL SS (BLADE)
BLADE SURG ROTATE 9660 (MISCELLANEOUS) IMPLANT
CANISTER SUCTION 2500CC (MISCELLANEOUS) ×2 IMPLANT
CLEANER TIP ELECTROSURG 2X2 (MISCELLANEOUS) ×2 IMPLANT
COVER SURGICAL LIGHT HANDLE (MISCELLANEOUS) ×2 IMPLANT
CRADLE DONUT ADULT HEAD (MISCELLANEOUS) IMPLANT
DECANTER SPIKE VIAL GLASS SM (MISCELLANEOUS) ×2 IMPLANT
DRAPE PROXIMA HALF (DRAPES) IMPLANT
ELECT COATED BLADE 2.86 ST (ELECTRODE) ×2 IMPLANT
ELECT REM PT RETURN 9FT ADLT (ELECTROSURGICAL) ×2
ELECTRODE REM PT RTRN 9FT ADLT (ELECTROSURGICAL) ×1 IMPLANT
GLOVE BIOGEL PI IND STRL 6.5 (GLOVE) ×2 IMPLANT
GLOVE BIOGEL PI INDICATOR 6.5 (GLOVE) ×2
GLOVE ECLIPSE 8.0 STRL XLNG CF (GLOVE) ×4 IMPLANT
GLOVE SURG SS PI 6.5 STRL IVOR (GLOVE) ×2 IMPLANT
GLOVE SURG SS PI 7.5 STRL IVOR (GLOVE) ×2 IMPLANT
GOWN STRL REUS W/ TWL LRG LVL3 (GOWN DISPOSABLE) ×1 IMPLANT
GOWN STRL REUS W/ TWL XL LVL3 (GOWN DISPOSABLE) ×1 IMPLANT
GOWN STRL REUS W/TWL LRG LVL3 (GOWN DISPOSABLE) ×1
GOWN STRL REUS W/TWL XL LVL3 (GOWN DISPOSABLE) ×1
KIT BASIN OR (CUSTOM PROCEDURE TRAY) ×2 IMPLANT
KIT ROOM TURNOVER OR (KITS) ×2 IMPLANT
NEEDLE HYPO 25GX1X1/2 BEV (NEEDLE) IMPLANT
NS IRRIG 1000ML POUR BTL (IV SOLUTION) ×2 IMPLANT
PAD ARMBOARD 7.5X6 YLW CONV (MISCELLANEOUS) ×4 IMPLANT
PENCIL BUTTON HOLSTER BLD 10FT (ELECTRODE) ×2 IMPLANT
SPONGE DRAIN TRACH 4X4 STRL 2S (GAUZE/BANDAGES/DRESSINGS) ×2 IMPLANT
SUT CHROMIC 2 0 SH (SUTURE) ×2 IMPLANT
SUT ETHILON 2 0 FS 18 (SUTURE) IMPLANT
SUT SILK 2 0 SH CR/8 (SUTURE) ×2 IMPLANT
TOWEL OR 17X24 6PK STRL BLUE (TOWEL DISPOSABLE) ×2 IMPLANT
TRAY ENT MC OR (CUSTOM PROCEDURE TRAY) ×2 IMPLANT
TUBE TRACH 6.0 EXL PROX CUF (TUBING) ×2 IMPLANT
WATER STERILE IRR 1000ML POUR (IV SOLUTION) ×2 IMPLANT

## 2015-01-22 NOTE — Procedures (Signed)
I was present at this session.  I have reviewed the session itself and made appropriate changes.  BP volatile related to anesthesia this am.  Cath function ok.  [name],[name] 5/17/201612:37 PM

## 2015-01-22 NOTE — Progress Notes (Signed)
ANTIBIOTIC CONSULT NOTE - INITIAL  Pharmacy Consult:  Vancomycin / Cefepime Indication:  HCAP  No Known Allergies  Patient Measurements: Height: 5\' 11"  (180.3 cm) Weight: (!) 304 lb 3.8 oz (138 kg) IBW/kg (Calculated) : 75.3  Vital Signs: Temp: 100.9 F (38.3 C) (05/17 0400) Temp Source: Oral (05/17 0400) BP: 123/35 mmHg (05/17 1030) Pulse Rate: 86 (05/17 1030) Intake/Output from previous day: 05/16 0701 - 05/17 0700 In: 477.7 [I.V.:240; NG/GT:237.7] Out: 1960 [Urine:1960] Intake/Output from this shift: Total I/O In: 610 [I.V.:610] Out: 25 [Urine:25]  Labs:  Recent Labs  01/20/15 0650 01/21/15 0350 01/22/15 0428  WBC 14.3* 13.0* 15.2*  HGB 11.6* 11.6* 11.8*  PLT 194 204 195  CREATININE 4.01* 4.98* 5.66*   Estimated Creatinine Clearance: 19.5 mL/min (by C-G formula based on Cr of 5.66). No results for input(s): VANCOTROUGH, VANCOPEAK, VANCORANDOM, GENTTROUGH, GENTPEAK, GENTRANDOM, TOBRATROUGH, TOBRAPEAK, TOBRARND, AMIKACINPEAK, AMIKACINTROU, AMIKACIN in the last 72 hours.   Microbiology: Recent Results (from the past 720 hour(s))  MRSA PCR Screening     Status: None   Collection Time: 12/28/14 10:18 PM  Result Value Ref Range Status   MRSA by PCR NEGATIVE NEGATIVE Final    Comment:        The GeneXpert MRSA Assay (FDA approved for NASAL specimens only), is one component of a comprehensive MRSA colonization surveillance program. It is not intended to diagnose MRSA infection nor to guide or monitor treatment for MRSA infections.   Culture, Urine     Status: None   Collection Time: 12/29/14 11:34 AM  Result Value Ref Range Status   Specimen Description URINE, CATHETERIZED  Final   Special Requests NONE  Final   Colony Count NO GROWTH Performed at [facility]   Final   Culture NO GROWTH Performed at [facility]   Final   Report Status 12/31/2014 FINAL  Final  Culture, respiratory (NON-Expectorated)     Status: None   Collection  Time: 12/29/14  1:28 PM  Result Value Ref Range Status   Specimen Description TRACHEAL ASPIRATE  Final   Special Requests NONE  Final   Gram Stain   Final    FEW WBC PRESENT,BOTH PMN AND MONONUCLEAR RARE SQUAMOUS EPITHELIAL CELLS PRESENT NO ORGANISMS SEEN Performed at [facility]    Culture   Final    NO GROWTH 2 DAYS Performed at [facility]    Report Status 12/31/2014 FINAL  Final  Culture, respiratory (NON-Expectorated)     Status: None   Collection Time: 01/02/15 10:48 AM  Result Value Ref Range Status   Specimen Description TRACHEAL ASPIRATE  Final   Special Requests NONE  Final   Gram Stain   Final    RARE WBC PRESENT, PREDOMINANTLY PMN NO SQUAMOUS EPITHELIAL CELLS SEEN NO ORGANISMS SEEN Performed at [facility]    Culture   Final    NO GROWTH 2 DAYS Performed at [facility]    Report Status 01/05/2015 FINAL  Final  Urine culture     Status: None   Collection Time: 01/02/15 11:00 AM  Result Value Ref Range Status   Specimen Description URINE, CATHETERIZED  Final   Special Requests NONE  Final   Colony Count NO GROWTH Performed at [facility]   Final   Culture NO GROWTH Performed at [facility]   Final   Report Status 01/04/2015 FINAL  Final  Culture, blood (routine x 2)     Status: None   Collection  Time: 01/02/15  4:44 PM  Result Value Ref Range Status   Specimen Description BLOOD LEFT ARM  Final   Special Requests BOTTLES DRAWN AEROBIC ONLY 6CC  Final   Culture   Final    NO GROWTH 5 DAYS Performed at [facility]    Report Status 01/09/2015 FINAL  Final  Culture, blood (routine x 2)     Status: None   Collection Time: 01/02/15  6:30 PM  Result Value Ref Range Status   Specimen Description BLOOD LEFT HAND  Final   Special Requests BOTTLES DRAWN AEROBIC ONLY 10CC  Final   Culture   Final    NO GROWTH 5 DAYS Performed at [facility]    Report Status 01/09/2015 FINAL  Final   Culture, respiratory (NON-Expectorated)     Status: None   Collection Time: 01/07/15 10:48 AM  Result Value Ref Range Status   Specimen Description BRONCHIAL ALVEOLAR LAVAGE  Final   Special Requests NONE  Final   Gram Stain   Final    FEW WBC PRESENT, PREDOMINANTLY PMN NO SQUAMOUS EPITHELIAL CELLS SEEN NO ORGANISMS SEEN Performed at [facility]    Culture   Final    NO GROWTH 2 DAYS Performed at [facility]    Report Status 01/09/2015 FINAL  Final  Culture, blood (routine x 2)     Status: None (Preliminary result)   Collection Time: 01/21/15 11:42 AM  Result Value Ref Range Status   Specimen Description BLOOD LEFT HAND  Final   Special Requests BOTTLES DRAWN AEROBIC ONLY 1.5CC  Final   Culture   Final           BLOOD CULTURE RECEIVED NO GROWTH TO DATE CULTURE WILL BE HELD FOR 5 DAYS BEFORE ISSUING A FINAL NEGATIVE REPORT Note: Culture results may be compromised due to an inadequate volume of blood received in culture bottles. Performed at [facility]    Report Status PENDING  Incomplete  Culture, blood (routine x 2)     Status: None (Preliminary result)   Collection Time: 01/21/15 11:49 AM  Result Value Ref Range Status   Specimen Description BLOOD PICC LINE  Final   Special Requests BOTTLES DRAWN AEROBIC AND ANAEROBIC  8CC EACH  Final   Culture   Final           BLOOD CULTURE RECEIVED NO GROWTH TO DATE CULTURE WILL BE HELD FOR 5 DAYS BEFORE ISSUING A FINAL NEGATIVE REPORT Performed at [facility]    Report Status PENDING  Incomplete  Culture, respiratory (NON-Expectorated)     Status: None (Preliminary result)   Collection Time: 01/21/15 12:47 PM  Result Value Ref Range Status   Specimen Description TRACHEAL ASPIRATE  Final   Special Requests Normal  Final   Gram Stain PENDING  Incomplete   Culture NO GROWTH Performed at [facility]   Final   Report Status PENDING  Incomplete    Medical History: Past Medical History   Diagnosis Date  . Arthritis   . RCT (rotator cuff tear)   . Multiple abrasions     rt arm  . Morbid obesity      Assessment: [age] YOM admitted s/p trach dislodgement, now s/p replacement.  Patient to start vancomycin and cefepime for HCAP.  He also has renal dysfunction currently receiving dialysis.  Patient is febrile with elevated WBC.  Vanc 5/17 >> Cefepime 5/17 >>  5/16 BCx x2 - 5/16 TA -  Goal of Therapy:  Vanc pre-HD level:  15-25 mcg/mL   Plan:  - Vanc  IV x 1 - Cefepime 2gm IV x 1 - F/U HD scheduled for further dosing - Monitor HD tolerance, clinical progress, vanc level as indicated - F/U resuming AC, A1c    Najia Hurlbutt D. Laney Potash, PharmD, BCPS Pager:  [phone] 01/22/2015, 10:51 AM

## 2015-01-22 NOTE — Op Note (Signed)
01/22/2015  9:00 AM    Knope, Fayrene FearingJames  [id number]   Pre-Op Dx:  Prolonged intubation. Tracheostomy tube dislodgment.  Post-op Dx: Same  Proc: Revision tracheostomy   Surg:  [name], Reatha Sur T MD  Anes:  GOT  EBL:  None  Comp:  None  Findings:  Minimal healing status post surgical tracheostomy 6 days ago. Endotracheal tube cuff visible in the tracheostomy.  Procedure: With the patient in a comfortable supine position on the operating table, anesthesia was administered per indwelling orotracheal tube. The patient was placed in a slight reverse Trendelenburg. A shoulder roll was placed. A sterile preparation and draping of the low neck was accomplished.  The previous wound was opened using [employer] retractors and the findings were as described above. A #6 Shiley cuffed XLT (proximal) tracheostomy tube was prepared in the standard fashion. The orotracheal tube was backed out and the tracheostomy tube was placed.  Carbon dioxide return was noted. The cuff was inflated and observed to be intact. Ventilation was assumed per tracheostomy tube in the orotracheal tube was removed. The trach tube was secured with 2-0 silk sutures, and then cotton twill ties in the standard fashion. A gauze dressing was applied. Hemostasis was observed. At this point the procedure was completed. The patient was returned to anesthesia, and transferred back to the 2100 intensive care unit in stable condition.  Dispo:    OR to intensive care unit  Plan:  Routine trach care. Could move to a cuffless tube next week if he no longer requires ventilation.  Cephus RicherWOLICKI,  Lateesha Bezold T MD

## 2015-01-22 NOTE — Progress Notes (Signed)
Have been attempting to reach pt's brother Chrissie Noawilliam for consent all shift. First called 01/21/15 at 1930 and left voice message to call back. All subsequent attempts have gone directly to voice mail. Called pt's friend listed under contacts in chart. Friend gave same number listed for brother but didn't know of any other way to reach him. Have relayed this information to surgery and surgeon. Per OR, will postpone for now and continue to attempt to contact pt's brother.

## 2015-01-22 NOTE — Anesthesia Postprocedure Evaluation (Signed)
  Anesthesia Post-op Note  Patient: [name]  Procedure(s) Performed: Procedure(s): TRACHEOSTOMY (N/A)  Patient Location: ICU  Anesthesia Type:General  Level of Consciousness: sedated  Airway and Oxygen Therapy: Patient remains intubated per anesthesia plan  Post-op Pain: none  Post-op Assessment: Post-op Vital signs reviewed, Patient's Cardiovascular Status Stable and Respiratory Function Stable  Post-op Vital Signs: Reviewed and stable  Last Vitals:  Filed Vitals:   01/22/15 0730  BP: 125/61  Pulse: 95  Temp:   Resp: 22    Complications: No apparent anesthesia complications

## 2015-01-22 NOTE — Progress Notes (Signed)
eLink Physician-Brief Progress Note Patient Name: Fayrene FearingJames Plotner DOB: Oct 07, 1950 MRN: [id number]   Date of Service  01/22/2015  HPI/Events of Note  Novolog SSI does not have instructions for how much Novolog to give to a specified blood glucose.   eICU Interventions  Will rewrite orders for sensitive Novolog SSI Q 4 hours.      Intervention Category Minor Interventions: Routine modifications to care plan (e.g. PRN medications for pain, fever)  Sommer,Steven Eugene 01/22/2015, 8:15 PM

## 2015-01-22 NOTE — Progress Notes (Signed)
Subjective: Interval History: has no complaint, sedated on vent.  Objective: Vital signs in last 24 hours: Temp:  [99.4 F (37.4 C)-101.2 F (38.4 C)] 100.9 F (38.3 C) (05/17 0400) Pulse Rate:  [87-102] 95 (05/17 0730) Resp:  [18-25] 22 (05/17 0730) BP: (73-156)/(47-98) 125/61 mmHg (05/17 0730) SpO2:  [95 %-99 %] 99 % (05/17 0730) FiO2 (%):  [40 %] 40 % (05/17 0730) Weight:  [138 kg (304 lb 3.8 oz)] 138 kg (304 lb 3.8 oz) (05/17 0400) Weight change: -1 kg (-2 lb 3.3 oz)  Intake/Output from previous day: 05/16 0701 - 05/17 0700 In: 467.7 [I.V.:230; NG/GT:237.7] Out: 1410 [Urine:1410] Intake/Output this shift:    General appearance: morbidly obese and sedated on vent, not coop Resp: diminished breath sounds bilaterally and rhonchi bilaterally Chest wall: RIJ cath Cardio: S1, S2 normal and systolic murmur: holosystolic 2/6, blowing at apex GI: obese, pos bs,liver down 6 cm Extremities: edema 1+presacral  Lab Results:  Recent Labs  01/21/15 0350 01/22/15 0428  WBC 13.0* 15.2*  HGB 11.6* 11.8*  HCT 36.2* 36.4*  PLT 204 195   BMET:  Recent Labs  01/21/15 0350 01/22/15 0428  NA 141 145  K 5.1 5.1  CL 99* 103  CO2 26 22  GLUCOSE 122* 136*  BUN 138* 176*  CREATININE 4.98* 5.66*  CALCIUM 9.3 9.4   No results for input(s): PTH in the last 72 hours. Iron Studies: No results for input(s): IRON, TIBC, TRANSFERRIN, FERRITIN in the last 72 hours.  Studies/Results: Dg Chest Port 1 View  01/22/2015   CLINICAL DATA:  Respiratory failure.  Hypoxia.  EXAM: PORTABLE CHEST - 1 VIEW  COMPARISON:  01/21/2015.  FINDINGS: Endotracheal tube, NG tube, dialysis catheter stable position. Mediastinum is stable. Subtle pneumomediastinum on today's exam is again identified. Tiny right apical pneumothorax continues to resolved. Stable cardiomegaly. Bilateral pulmonary infiltrates are noted. No acute bony abnormality .  IMPRESSION: 1. Lines and tubes in stable position. 2. Tiny right apical  pneumothorax continues to resolve. Minimal residual pneumomediastinum is noted on today's exam. No subcutaneous emphysema noted on today's exam. 3. Bilateral pulmonary infiltrates are noted on today's exam. These changes could be related to pulmonary edema and/or pneumonia. 4. Stable cardiomegaly. No pulmonary venous congestion noted on today's exam .   Electronically Signed   By: Maisie Fushomas  Register   On: 01/22/2015 07:16   Dg Chest Port 1 View  01/21/2015   CLINICAL DATA:  Acute respiratory failure.  EXAM: PORTABLE CHEST - 1 VIEW  COMPARISON:  01/20/2015.  05/ 14/2016.  FINDINGS: Interim removal of tracheostomy tube. Endotracheal tube noted in good anatomic position. NG tube, left PICC line, and dialysis catheter in stable position. Mediastinum hilar structures normal. Cardiomegaly with pulmonary vascular prominence and interstitial prominence noted suggesting mild congestive heart failure. Previously identified right pneumothorax has almost completely resolved. Pneumomediastinum and chest wall subcutaneous emphysema has resolved. Small left pleural effusion cannot be excluded .  IMPRESSION: 1. Interim near complete resolution of right pneumothorax. Interim resolution of pneumomediastinum and chest wall emphysema. 2. Lines and tubes in good anatomic position. 3. Cardiomegaly with pulmonary venous congestion and mild interstitial prominence suggesting mild congestive heart failure. Small left pleural effusion cannot be excluded .   Electronically Signed   By: Maisie Fushomas  Register   On: 01/21/2015 07:21    I have reviewed the patient's [place] medications.  Assessment/Plan: 1 AKI  Non oliguric but not clearing solute.  [place]^^^BUN with catabolic state. Will do HD today.. Vol xs mild/  Mild acidemia 2 VDRF per CCM 3 OSA 4 Massive obesity limiting outcome 5 Sepsis on AB 6 Trach for replacement 7 Anemia mild 8 COPD 9 DVT P HD, Trach,AB,vent.     LOS: 2 days   Russell Engelstad,Jermie L 01/22/2015,7:32 AM

## 2015-01-22 NOTE — Progress Notes (Signed)
PULMONARY / CRITICAL CARE MEDICINE   Name: [name] MRN: [id number] DOB: Jan 03, 1951    ADMISSION DATE:  01/20/2015  [facility]   CHIEF COMPLAINT:  Tracheostomy Dislodgement  INITIAL PRESENTATION: [name] initially admitted 4/22-5/14 for R rotator cuff repair by Ortho.  Failed extubation post-op and reintubated 4/23.  Post operative course complicated by prolonged respiratory respiratory failure, findings of testicular masses, acute renal failure requiring CVVHD, BLE DVT, and tracheostomy bleeding s/p revision.  [facility] and [facility]ostomy became dislodged, [facility] early [facility].    STUDIES:  4/23 US scrotum: Two solid extratesticular right scrotal masses. Primary differential diagnosis for extratesticular masses include primary or metastatic neoplasms including adenomatoid tumor, fibroma, leiomyoma, mesothelioma, or sarcoma. Urology consultation is recommended  4/26 TEE: LVEF 60-65%, Grade 1 DD, trivial pericardial effusion 4/27 US testes with masses 5/02 renal US: no hydronephrosis, Possible nonobstructing stone lower pole left kidney  SIGNIFICANT EVENTS: 4/22 - 5/14  Admit for R rotator cuff repair, complicated course with prolonged resp fx, renal fx, DVT, testicular masses  SUBJECTIVE:  Went to OR this morning for tracheostomy, has HCAP   VITAL SIGNS: Temp:  [99.4 F (37.4 C)-101.2 F (38.4 C)] 100.9 F (38.3 C) (05/17 0400) Pulse Rate:  [80-102] 86 (05/17 1030) Resp:  [15-25] 19 (05/17 1030) BP: (73-156)/(35-98) 123/35 mmHg (05/17 1030) SpO2:  [96 %-100 %] 99 % (05/17 1030) FiO2 (%):  [40 %] 40 % (05/17 0900) Weight:  [138 kg (304 lb 3.8 oz)] 138 kg (304 lb 3.8 oz) (05/17 0400)   HEMODYNAMICS:     VENTILATOR SETTINGS: Vent Mode:  [-] PRVC FiO2 (%):  [40 %] 40 % Set Rate:  [16 bmp] 16 bmp Vt Set:  [600 mL] 600 mL PEEP:  [5 cmH20] 5 cmH20 Pressure Support:  [10 cmH20] 10 cmH20 Plateau Pressure:  [20 cmH20-21  cmH20] 20 cmH20   INTAKE / OUTPUT:  Intake/Output Summary (Last 24 hours) at 01/22/15 1044 Last data filed at 01/22/15 1000  Gross per 24 hour  Intake 1057.67 ml  Output   1835 ml  Net -777.33 ml    PHYSICAL EXAMINATION:  Gen: sedated on vent, arouses to voice HENT: NCAT EOMi, Trache in place PULM: CTA B, vent supported breaths CV: RRR, no mgr GI: BS+, soft, nontender MSK: R shoulder without redness, swelling, sling in place Neuro: stirs to touch  LABS:  CBC  Recent Labs Lab 01/20/15 0650 01/21/15 0350 01/22/15 0428  WBC 14.3* 13.0* 15.2*  HGB 11.6* 11.6* 11.8*  HCT 36.7* 36.2* 36.4*  PLT 194 204 195   Coag's No results for input(s): APTT, INR in the last 168 hours.   BMET  Recent Labs Lab 01/20/15 0650 01/21/15 0350 01/22/15 0428  NA 141 141 145  K 5.2* 5.1 5.1  CL 97* 99* 103  CO2 26 26 22   BUN 102* 138* 176*  CREATININE 4.01* 4.98* 5.66*  GLUCOSE 162* 122* 136*   Electrolytes  Recent Labs Lab 01/20/15 0650 01/21/15 0350 01/21/15 1045 01/21/15 2228 01/22/15 0428  CALCIUM 9.0 9.3  --   --  9.4  MG  --   --  2.9* 3.0*  --   PHOS  --   --  9.5* 9.8* 10.6*   Sepsis Markers No results for input(s): LATICACIDVEN, PROCALCITON, O2SATVEN in the last 168 hours.   ABG  Recent Labs Lab 01/21/15 0401  PHART 7.413  PCO2ART 37.6  PO2ART 89.0  Liver Enzymes  Recent Labs Lab 01/18/15 0455 01/19/15 0521 01/22/15 0428  ALBUMIN 2.8* 2.8* 3.0*   Cardiac Enzymes No results for input(s): TROPONINI, PROBNP in the last 168 hours.   Glucose  Recent Labs Lab 01/21/15 0717 01/21/15 1116 01/21/15 1540 01/21/15 2005 01/22/15 0014 01/22/15 0414  GLUCAP 121* 135* 135* 122* 128* 136*    Imaging 5/17 CXR images reviewed> small pneumothorax right lung apex nearly completely resolved, LLL infiltrate  ASSESSMENT / PLAN:  PULMONARY OETT 5/15 >>>  Trach 5/11 [facility] >> A: Acute on Chronic Hypoxic / Hypercapnic Respiratory Failure > appears  back to baseline but needs tracheostomy replaced given high risk of respiratory failure Tracheostomy Dislodgement > now s/p redo tracheostomy 5/17 R Pneumothorax (5/15) > nearly completely resolved Presumed OSA / OHS Tobacco Abuse  HCAP > new 5/17 P:   Attempt trach collar trials today Trach care pulm toilette No intervention needed for pneumothorax, follow CXR daily See ID  CARDIOVASCULAR CVL A:  Hx of HTN, chronic dCHF, HLD H/O chronic diastolic heart failure Extensive BLE DVT s/p IVC Filter placement  P:  Holding heparin given bleeding from tracheostomy last week Plan to restart anticoagulation if no bleeding after re-do tracheostomy Would plan to remove IVC filter if tolerating anticoagulation   Continue prn hydralazine  RENAL Tunneled HD Cath 5/13 >>  A:  AKI 2nd to ATN Scrotal mass - AFP and beta HCG normal P:  Monitor BMET intermittently Monitor I/Os Correct electrolytes as indicated HD per Renal When recovers sufficiently from Trieu illness, consider Urology eval for scrotal mass  GASTROINTESTINAL A:   High GRVs - recurrent Constipation, resolved P:  SUP: enteral famotidine Resume TFs now Cont EES as motility agent (started 5/09)  HEMATOLOGIC A:   Polycythemia - likely chronic hypoxemia. Resolved Anemia - ICU acquired + acute blood loss with tracheostomy bleeding > not actively bleeding P:  DVT px: Heparin on hold.  Maintain IVC filter until tolerating anticoagulation, then remove Monitor CBC intermittently Transfuse per usual ICU guidelines  INFECTIOUS A:  Fever 5/16> due to HCAP P:    5/16 blood cx >> 5/16 resp cx >>  5/17 vanc >> 5/17 cefepime >> Daily CXR PCT protocol to limit total duration of abx  ENDOCRINE A:  Hyperglycemia without prior documentation of DM P:  Sensitive SSI  May need lantus restarted once TF resumed  Check Hgb A1c  NEUROLOGIC A:  Acute encephalopathy > improving Post op pain Severe  deconditioning P:  RASS goal 0  Cont PRN fent, PRN lorazepam  ORTHO A: Rt shoulder surgery 4/22 P: Post op mgmt per Ortho  Continue ROM of RUL Resume PT post tracheostomy   FAMILY  - Updates: No family available at time 5/16  Heber CarolinaBrent McQuaid, MD [place] [facility] Pager: [phone] Cell: [phone] If no response, call [phone]

## 2015-01-22 NOTE — Anesthesia Preprocedure Evaluation (Addendum)
Anesthesia Evaluation  Patient identified by MRN, date of birth, ID band  Reviewed: Allergy & Precautions, NPO status , Patient's Chart, lab work & pertinent test results  Airway Mallampati: Intubated   Neck ROM: limited   Comment: Tracheostomy in place. Dental   Pulmonary shortness of breath, Gagner Smoker,  Respiratory failure. breath sounds clear to auscultation        Cardiovascular hypertension, Rhythm:regular Rate:Normal     Neuro/Psych    GI/Hepatic   Endo/Other  Morbid obesity  Renal/GU ARFRenal disease     Musculoskeletal  (+) Arthritis -,   Abdominal   Peds  Hematology   Anesthesia Other Findings   Reproductive/Obstetrics                            Anesthesia Physical Anesthesia Plan  ASA: IV  Anesthesia Plan: General   Post-op Pain Management:    Induction: Inhalational and Intravenous  Airway Management Planned: Tracheostomy  Additional Equipment:   Intra-op Plan:   Post-operative Plan: Possible Post-op intubation/ventilation  Informed Consent: I have reviewed the patients History and Physical, chart, labs and discussed the procedure including the risks, benefits and alternatives for the proposed anesthesia with the patient or authorized representative who has indicated his/her understanding and acceptance.     Plan Discussed with: CRNA, Anesthesiologist and Surgeon  Anesthesia Plan Comments:         Anesthesia Quick Evaluation

## 2015-01-22 NOTE — Care Management Note (Signed)
Case Management Note  Patient Details  Name: [name] Gracia MRN: [id number] Date of Birth: [birth year]  Subjective/Objective:                    Action/Plan:   Expected Discharge Date:                  Expected Discharge Plan:  Long Term Acute Care (LTAC)  In-House Referral:     Discharge planning Services     Post Acute Care Choice:  Long Term Acute Care (LTAC) Choice offered to:     DME Arranged:    DME Agency:     HH Arranged:    HH Agency:     Status of Service:     Medicare Important Message Given:    Date Medicare IM Given:    Medicare IM give by:    Date Additional Medicare IM Given:    Additional Medicare Important Message give by:     If discussed at Long Length of Stay Meetings, dates discussed:    Additional Comments: per pccm phy ready for back to [facility]. New trach. Spoke w [facility] selct rep and they are working on ins auth for readmit. Have left vm for brother to let him know trying to get pt back to [facility]. Await auth by ins and call back from [facility].  Zuleyma Scharf T, RN 01/22/2015, 2:31 PM

## 2015-01-22 NOTE — Transfer of Care (Signed)
Immediate Anesthesia Transfer of Care Note  Patient: [name]  Procedure(s) Performed: Procedure(s): TRACHEOSTOMY (N/A)  Patient Location: ICU  Anesthesia Type:General  Level of Consciousness: sedated  Airway & Oxygen Therapy: Patient placed on Ventilator (see vital sign flow sheet for setting)  Post-op Assessment: Report given to RN and Post -op Vital signs reviewed and stable  Post vital signs: Reviewed and stable  Last Vitals:  Filed Vitals:   01/22/15 0730  BP: 125/61  Pulse: 95  Temp:   Resp: 22    Complications: No apparent anesthesia complications.  Obturator at head of bed.

## 2015-01-22 NOTE — Addendum Note (Signed)
Addendum  created 01/22/15 1531 by Edmonia CaprioAmanda M Braelynn Lupton, CRNA   Modules edited: Anesthesia Attestations

## 2015-01-23 ENCOUNTER — Inpatient Hospital Stay (HOSPITAL_COMMUNITY): Payer: Worker's Compensation

## 2015-01-23 ENCOUNTER — Encounter (HOSPITAL_COMMUNITY): Payer: Self-pay | Admitting: Otolaryngology

## 2015-01-23 LAB — MAGNESIUM
MAGNESIUM: 2.3 mg/dL (ref 1.7–2.4)
Magnesium: 2.4 mg/dL (ref 1.7–2.4)

## 2015-01-23 LAB — GLUCOSE, CAPILLARY
GLUCOSE-CAPILLARY: 165 mg/dL — AB (ref 65–99)
GLUCOSE-CAPILLARY: 134 mg/dL — AB (ref 65–99)
Glucose-Capillary: 142 mg/dL — ABNORMAL HIGH (ref 65–99)
Glucose-Capillary: 135 mg/dL — ABNORMAL HIGH (ref 65–99)
Glucose-Capillary: 128 mg/dL — ABNORMAL HIGH (ref 65–99)
Glucose-Capillary: 122 mg/dL — ABNORMAL HIGH (ref 65–99)

## 2015-01-23 LAB — CBC WITH DIFFERENTIAL/PLATELET
Basophils Absolute: 0.2 10*3/uL — ABNORMAL HIGH (ref 0.0–0.1)
Basophils Relative: 1 % (ref 0–1)
EOS PCT: 7 % — AB (ref 0–5)
Eosinophils Absolute: 1 10*3/uL — ABNORMAL HIGH (ref 0.0–0.7)
HEMATOCRIT: 33.7 % — AB (ref 39.0–52.0)
Hemoglobin: 10.5 g/dL — ABNORMAL LOW (ref 13.0–17.0)
LYMPHS PCT: 9 % — AB (ref 12–46)
Lymphs Abs: 1.2 10*3/uL (ref 0.7–4.0)
MCH: 30.6 pg (ref 26.0–34.0)
MCHC: 31.2 g/dL (ref 30.0–36.0)
MCV: 98.3 fL (ref 78.0–100.0)
MONO ABS: 1.1 10*3/uL — AB (ref 0.1–1.0)
MONOS PCT: 8 % (ref 3–12)
Neutro Abs: 10.5 10*3/uL — ABNORMAL HIGH (ref 1.7–7.7)
Neutrophils Relative %: 75 % (ref 43–77)
Platelets: 149 10*3/uL — ABNORMAL LOW (ref 150–400)
RBC: 3.43 MIL/uL — ABNORMAL LOW (ref 4.22–5.81)
RDW: 16.2 % — AB (ref 11.5–15.5)
WBC: 14.1 10*3/uL — AB (ref 4.0–10.5)

## 2015-01-23 LAB — BASIC METABOLIC PANEL
ANION GAP: 13 (ref 5–15)
BUN: 107 mg/dL — ABNORMAL HIGH (ref 6–20)
CO2: 26 mmol/L (ref 22–32)
Calcium: 8.6 mg/dL — ABNORMAL LOW (ref 8.9–10.3)
Chloride: 103 mmol/L (ref 101–111)
Creatinine, Ser: 3.4 mg/dL — ABNORMAL HIGH (ref 0.61–1.24)
GFR calc Af Amer: 21 mL/min — ABNORMAL LOW (ref >60)
GFR, EST NON AFRICAN AMERICAN: 18 mL/min — AB (ref >60)
Glucose, Bld: 155 mg/dL — ABNORMAL HIGH (ref 65–99)
Potassium: 4 mmol/L (ref 3.5–5.1)
Sodium: 142 mmol/L (ref 135–145)

## 2015-01-23 LAB — CULTURE, RESPIRATORY

## 2015-01-23 LAB — CULTURE, RESPIRATORY W GRAM STAIN: Special Requests: NORMAL

## 2015-01-23 LAB — PROCALCITONIN: Procalcitonin: 2.54 ng/mL

## 2015-01-23 MED ORDER — SODIUM CHLORIDE 0.9 % IV SOLN
INTRAVENOUS | Status: DC
  Administered 2015-01-23: 500 mL via INTRAVENOUS
  Administered 2015-01-23 – 2015-01-24 (×6): via INTRAVENOUS

## 2015-01-23 NOTE — Addendum Note (Signed)
Addendum  created 01/23/15 0935 by Edmonia CaprioAmanda M Suhailah Kwan, CRNA   Modules edited: Anesthesia Medication Administration

## 2015-01-23 NOTE — Evaluation (Addendum)
Occupational Therapy Evaluation Patient Details Name: [name] MRN: [id number] DOB: [birth year] Today's Date: 01/23/2015    History of Present Illness 64 y.o. M initially admitted 4/22-5/14 for R rotator cuff repair by Ortho. Failed extubation post-op and reintubated 4/23. Post operative course complicated by prolonged respiratory respiratory failure, findings of testicular masses, acute renal failure requiring CVVHD, BLE DVT, and tracheostomy bleeding s/p revision. [facility] and tracheostomy became dislodged, [facility] early am.    Clinical Impression   Pt admitted with above. Feel pt will benefit from acute OT to increase strength and to address Rt UE prior to d/c.    Follow Up Recommendations  LTACH    Equipment Recommendations  Other (comment) (defer to next venue)    Recommendations for Other Services       Precautions / Restrictions Precautions Precautions: Fall;Shoulder Type of Shoulder Precautions: Per order, ROM of wrist and elbow out of sling daily. Skin care and monitoring. Passive Right shoulder ROM out of the sling BID. Precaution Booklet Issued: No Required Braces or Orthoses: Sling (with abductor pillow) Restrictions Weight Bearing Restrictions: Yes RUE Weight Bearing: Non weight bearing      Mobility Bed Mobility Overal bed mobility: Needs Assistance Bed Mobility: Rolling Rolling: Total assist         General bed mobility comments: OT rolled pt partially to get pillow under right shoulder. +2 assist to scoot HOB.  Transfers                 General transfer comment: not assessed         ADL Overall ADL's : Needs assistance/impaired Eating/Feeding: NPO   Grooming: Total assistance;Bed level (washed hand and face)   Upper Body Bathing: Total assistance;Bed level (+2)   Lower Body Bathing: +2 for physical assistance;Total assistance;Bed level       Lower Body Dressing: Total  assistance;+2 for physical assistance;Bed level               Functional mobility during ADLs:  (not assessed) General ADL Comments: OT washed off pt's eyes and left hand for him, Pt able to follow some commands-inconsistent. Repositioned pt in bed and placed roll under right foot as well as pillow under right shoulder.     Vision     Perception     Praxis      Pertinent Vitals/Pain Pain Assessment: Faces Faces Pain Scale: Hurts even more Pain Location: Rt UE with some movements Pain Descriptors / Indicators: Grimacing Pain Intervention(s): Repositioned;Monitored during session;Limited activity within patient's tolerance   Pt on around 8L of O2 at 35%. Vitals stable.     Hand Dominance     Extremity/Trunk Assessment Upper Extremity Assessment Upper Extremity Assessment: RUE deficits/detail;LUE deficits/detail           Communication Communication Communication: Tracheostomy   Cognition Arousal/Alertness: Awake/alert Behavior During Therapy: Flat affect Overall Cognitive Status: Difficult to assess                     General Comments       Exercises Exercises: Other exercises Other Exercises Other Exercises: Performed PROM/AAROM right elbow flexion/extension, PROM right shoulder flexion (approximately 90 degrees)/abduction/external rotation, PROM wrist flexion/extension and digit ROM Other Exercises: OT performed PROM to Lt UE   Shoulder Instructions      Home Living Family/patient expects to be discharged to:: Unsure Living Arrangements: Other relatives  Prior Functioning/Environment Level of Independence: [facility]             OT Diagnosis: Generalized weakness;Acute pain;Cognitive deficits   OT Problem List: Decreased strength;Impaired UE functional use;Pain;Cardiopulmonary status limiting activity;Decreased knowledge of use of DME or  AE;Decreased knowledge of precautions;Decreased cognition;Decreased activity tolerance   OT Treatment/Interventions: Self-care/ADL training;Therapeutic exercise;DME and/or AE instruction;Therapeutic activities;Cognitive remediation/compensation;Patient/family education;Balance training    OT Goals([place] goals can be found in the care plan section) Acute Rehab OT Goals Patient Stated Goal: unable to state OT Goal Formulation: Patient unable to participate in goal setting Time For Goal Achievement: 02/06/15 Potential to Achieve Goals: Fair ADL Goals Additional ADL Goal #1: Pt will follow commands 50% of the time. Additional ADL Goal #2: Pt will perform bed mobility with Max assist. Additional ADL Goal #3: Pt will perform bilateral UE ROM with Max assist.  OT Frequency: Min 3X/week   Barriers to D/C:            Co-evaluation              End of Session Equipment Utilized During Treatment: Other (comment) (sling) Nurse Communication: Other (comment) (pillow behind right arm; mark on left foot from bed/repositioned; alertness; asked about monitor reading vtach)  Activity Tolerance: Patient tolerated treatment well Patient left: in bed with mitt placed on left hand   Time: [id number] OT Time Calculation (min): 25 min Charges:  OT General Charges $OT Visit: 1 Procedure OT Evaluation $Initial OT Evaluation Tier I: 1 Procedure G-CodesEarlie Raveling:    Devaney Segers L OTR/L [phone] 01/23/2015, 3:57 PM

## 2015-01-23 NOTE — Progress Notes (Signed)
Subjective: Interval History: has no complaint ,opens eyes to voice but not coop.  Objective: Vital signs in last 24 hours: Temp:  [98.2 F (36.8 C)-100.7 F (38.2 C)] 100 F (37.8 C) (05/18 0408) Pulse Rate:  [67-95] 83 (05/18 0600) Resp:  [10-26] 21 (05/18 0600) BP: (69-150)/(31-108) 91/55 mmHg (05/18 0600) SpO2:  [95 %-100 %] 98 % (05/18 0600) FiO2 (%):  [40 %] 40 % (05/18 0325) Weight:  [138 kg (304 lb 3.8 oz)-146 kg (321 lb 14 oz)] 146 kg (321 lb 14 oz) (05/18 0331) Weight change: 0 kg (0 lb)  Intake/Output from previous day: 05/17 0701 - 05/18 0700 In: 1738.8 [I.V.:650; NG/GT:588.8; IV Piggyback:500] Out: 85 [Urine:1075] Intake/Output this shift:    General appearance: morbidly obese, pale and not coop Resp: diminished breath sounds bilaterally, rhonchi bibasilar and trach Chest wall: RIJ cath Cardio: S1, S2 normal and systolic murmur: holosystolic 2/6, blowing at apex GI: obese,pos bs, soft Extremities: edema 1+  Lab Results:  Recent Labs  01/22/15 1015 01/23/15 0430  WBC 15.3* 14.1*  HGB 11.3* 10.5*  HCT 36.3* 33.7*  PLT 188 149*   BMET:  Recent Labs  01/22/15 1015 01/23/15 0430  NA 146* 142  K 5.0 4.0  CL 104 103  CO2 23 26  GLUCOSE 133* 155*  BUN 173* 107*  CREATININE 5.58* 3.40*  CALCIUM 8.9 8.6*   No results for input(s): PTH in the last 72 hours. Iron Studies: No results for input(s): IRON, TIBC, TRANSFERRIN, FERRITIN in the last 72 hours.  Studies/Results: Dg Chest Port 1 View  01/22/2015   CLINICAL DATA:  Respiratory failure.  Hypoxia.  EXAM: PORTABLE CHEST - 1 VIEW  COMPARISON:  01/21/2015.  FINDINGS: Endotracheal tube, NG tube, dialysis catheter stable position. Mediastinum is stable. Subtle pneumomediastinum on today's exam is again identified. Tiny right apical pneumothorax continues to resolved. Stable cardiomegaly. Bilateral pulmonary infiltrates are noted. No acute bony abnormality .  IMPRESSION: 1. Lines and tubes in stable position.  2. Tiny right apical pneumothorax continues to resolve. Minimal residual pneumomediastinum is noted on today's exam. No subcutaneous emphysema noted on today's exam. 3. Bilateral pulmonary infiltrates are noted on today's exam. These changes could be related to pulmonary edema and/or pneumonia. 4. Stable cardiomegaly. No pulmonary venous congestion noted on today's exam .   Electronically Signed   By: Maisie Fushomas  Register   On: 01/22/2015 07:16    I have reviewed the patient's [place] medications.  Assessment/Plan: 1 AKI nonoliguric.  Acid/base/K ok.  bp low , will give vol.  Hopefully slow recovery 2 VDRF per CCM 3 Trach dislodge 4 Anemia mildly lower 5 COPD 6 Obesity 7 Shoulder surgery 8Sepsis  ^ WBC, on AB 9 HCAP 10 nutritiion TF P bolus, follow vol , AB, trach/vent,  TF,     LOS: 3 days   Micha Erck,Regan L 01/23/2015,7:10 AM

## 2015-01-23 NOTE — Progress Notes (Signed)
eLink Physician-Brief Progress Note Patient Name: [name] DOB: [birth year] MRN: [id number]   Date of Service  01/23/2015  HPI/Events of Note  GPC clusters in blood  eICU Interventions  Resume vanc (renal dosing) until final cx back     Intervention Category Intermediate Interventions: Diagnostic test evaluation  ALVA,RAKESH V. 01/23/2015, 12:05 AM

## 2015-01-23 NOTE — Progress Notes (Signed)
Confirmed per Dr. Vassie LollAlva, Systolic goal >90 acceptable.

## 2015-01-23 NOTE — Progress Notes (Signed)
Patient placed on 35% ATC. No complications. Vital signs stable. Patient tolerating well at this time. RT will continue to monitor.

## 2015-01-23 NOTE — Progress Notes (Addendum)
PULMONARY / CRITICAL CARE MEDICINE   Name: [name] MRN: [id number] DOB: 1951-03-14    ADMISSION DATE:  01/20/2015  [facility]  [facility]   CHIEF COMPLAINT:  Tracheostomy Dislodgement  INITIAL PRESENTATION: [name] initially admitted 4/22-5/14 for R rotator cuff repair by Ortho.  Failed extubation post-op and reintubated 4/23.  Post operative course complicated by prolonged respiratory respiratory failure, findings of testicular masses, acute renal failure requiring CVVHD, BLE DVT, and tracheostomy bleeding s/p revision.  [facility] and [facility] became [facility]lod[facility], [facility] early am.    STUDIES:  4/23 US scrotum: Two solid extratesticular right scrotal masses. Primary differential diagnosis for extratesticular masses include primary or metastatic neoplasms including adenomatoid tumor, fibroma, leiomyoma, mesothelioma, or sarcoma. Urology consultation is recommended  4/26 TEE: LVEF 60-65%, Grade 1 DD, trivial pericardial effusion 4/27 US testes with masses 5/02 renal US: no hydronephrosis, Possible nonobstructing stone lower pole left kidney  SIGNIFICANT EVENTS: 4/22 - 5/14  Admit for R rotator cuff repair, complicated course with prolonged resp fx, renal fx, DVT, testicular masses 5/17 re-do tracheostomy, HCAP  SUBJECTIVE:  Weaning on vent, fever continues   VITAL SIGNS: Temp:  [98.2 F (36.8 C)-100.7 F (38.2 C)] 99.7 F (37.6 C) (05/18 0741) Pulse Rate:  [67-93] 80 (05/18 0900) Resp:  [10-28] 22 (05/18 0900) BP: (69-150)/(31-108) 108/50 mmHg (05/18 0900) SpO2:  [95 %-100 %] 100 % (05/18 0900) FiO2 (%):  [40 %] 40 % (05/18 0808) Weight:  [138 kg (304 lb 3.8 oz)-146 kg (321 lb 14 oz)] 146 kg (321 lb 14 oz) (05/18 0331)   HEMODYNAMICS:     VENTILATOR SETTINGS: Vent Mode:  [-] CPAP;PSV FiO2 (%):  [40 %] 40 % Set Rate:  [16 bmp] 16 bmp Vt Set:  [600 mL] 600 mL PEEP:  [5 cmH20] 5 cmH20 Pressure Support:  [10 cmH20] 10  cmH20 Plateau Pressure:  [17 cmH20-22 cmH20] 20 cmH20   INTAKE / OUTPUT:  Intake/Output Summary (Last 24 hours) at 01/23/15 1002 Last data filed at 01/23/15 0800  Gross per 24 hour  Intake 1268.8 ml  Output    160 ml  Net 1108.8 ml    PHYSICAL EXAMINATION:  Gen: awake on vent, interactive HENT: NCAT EOMi, Trach in place PULM: CTA B, vent supported breaths CV: RRR, no mgr GI: BS+, soft, nontender MSK: R shoulder without redness, swelling, sling in place Derm: mild redness near PICC site, no drainage Neuro: Awake, interactive, maew  LABS:  CBC  Recent Labs Lab 01/22/15 0428 01/22/15 1015 01/23/15 0430  WBC 15.2* 15.3* 14.1*  HGB 11.8* 11.3* 10.5*  HCT 36.4* 36.3* 33.7*  PLT 195 188 149*   Coag's No results for input(s): APTT, INR in the last 168 hours.   BMET  Recent Labs Lab 01/22/15 0428 01/22/15 1015 01/23/15 0430  NA 145 146* 142  K 5.1 5.0 4.0  CL 103 104 103  CO2 22 23 26   BUN 176* 173* 107*  CREATININE 5.66* 5.58* 3.40*  GLUCOSE 136* 133* 155*   Electrolytes  Recent Labs Lab 01/21/15 2228 01/22/15 0428 01/22/15 1015 01/22/15 2200 01/23/15 0430  CALCIUM  --  9.4 8.9  --  8.6*  MG 3.0*  --  3.0* 2.3  --   PHOS 9.8* 10.6* 11.7*  --   --    Sepsis Markers  Recent Labs Lab 01/22/15 1015 01/23/15 0430  PROCALCITON 3.32 2.54     ABG  Recent Labs Lab  01/21/15 0401  PHART 7.413  PCO2ART 37.6  PO2ART 89.0   Liver Enzymes  Recent Labs Lab 01/19/15 0521 01/22/15 0428 01/22/15 1015  ALBUMIN 2.8* 3.0* 2.7*   Cardiac Enzymes No results for input(s): TROPONINI, PROBNP in the last 168 hours.   Glucose  Recent Labs Lab 01/22/15 1157 01/22/15 1532 01/22/15 1942 01/22/15 2338 01/23/15 0408 01/23/15 0737  GLUCAP 121* 164* 160* 148* 142* 165*    Imaging 5/18 CXR images personally reviewed> cannot see R pneumothorax, left lung infiltrate persists  ASSESSMENT / PLAN:  PULMONARY OETT 5/15 >>>  Trach 5/11 [facility]  >> A: Acute on Chronic Hypoxic / Hypercapnic Respiratory Failure > appears back to baseline but needs tracheostomy replaced given high risk of respiratory failure Tracheostomy Dislodgement > now s/p redo tracheostomy 5/17 R Pneumothorax (5/15) > resolved Presumed OSA / OHS Tobacco Abuse  HCAP > new 5/17 P:   Attempt trach collar trials today Trach care pulm toilette Daily CXR See ID  CARDIOVASCULAR CVL A:  Hx of HTN, chronic dCHF, HLD H/O chronic diastolic heart failure Extensive BLE DVT s/p IVC Filter placement  P:  Holding heparin given bleeding from tracheostomy last week Plan to restart anticoagulation if no bleeding after re-do tracheostomy Would plan to remove IVC filter if tolerating anticoagulation   Continue prn hydralazine  RENAL Tunneled HD Cath 5/13 >>  A:  AKI 2nd to ATN Scrotal mass - AFP and beta HCG normal P:  Monitor BMET and UOP Replace electrolytes as needed HD per Renal When recovers sufficiently from [place] illness, consider Urology eval for scrotal mass  GASTROINTESTINAL A:   High GRVs - recurrent Constipation, resolved P:  SUP: enteral famotidine Continue TFs Cont EES as motility agent (started 5/09)  HEMATOLOGIC A:   Polycythemia - likely chronic hypoxemia. Resolved Anemia - ICU acquired + acute blood loss with tracheostomy bleeding > not actively bleeding P:  DVT px: Heparin on hold.  Maintain IVC filter until tolerating anticoagulation, then remove Monitor CBC intermittently Transfuse per usual ICU guidelines  INFECTIOUS A:  Fever 5/16> due to HCAP GPC in blood, likely contaminant but has multiple indwelling lines P:    5/16 blood cx >> 1/2 GPC 5/16 resp cx >>  5/17 vanc >> 5/17 cefepime >>  Daily CXR Will need to change PICC line if blood culture not a contaminant Follow PCT protocol to limit total duration of abx  ENDOCRINE A:  Hyperglycemia without prior documentation of DM P:  Sensitive SSI  Check  Hgb A1c  NEUROLOGIC A:  Acute encephalopathy > improving Post op pain Severe deconditioning P:  RASS goal 0  Cont PRN fent, PRN lorazepam  ORTHO A: Rt shoulder surgery 4/22 P: Post op mgmt per Ortho  Continue ROM of RUL Resume PT post tracheostomy   FAMILY  - Updates: No family available at time 5/16, called brother Chrissie NoaWilliam on 5/18, had to leave a message  Heber CarolinaBrent McQuaid, MD [place] [facility] Pager: [phone] Cell: [phone] If no response, call [phone]

## 2015-01-24 LAB — CBC
HCT: 33.5 % — ABNORMAL LOW (ref 39.0–52.0)
HEMOGLOBIN: 10.2 g/dL — AB (ref 13.0–17.0)
MCH: 30 pg (ref 26.0–34.0)
MCHC: 30.4 g/dL (ref 30.0–36.0)
MCV: 98.5 fL (ref 78.0–100.0)
Platelets: 138 10*3/uL — ABNORMAL LOW (ref 150–400)
RBC: 3.4 MIL/uL — AB (ref 4.22–5.81)
RDW: 16.1 % — ABNORMAL HIGH (ref 11.5–15.5)
WBC: 11.8 10*3/uL — ABNORMAL HIGH (ref 4.0–10.5)

## 2015-01-24 LAB — CULTURE, BLOOD (ROUTINE X 2)

## 2015-01-24 LAB — URINALYSIS, ROUTINE W REFLEX MICROSCOPIC
BILIRUBIN URINE: NEGATIVE
Glucose, UA: NEGATIVE mg/dL
KETONES UR: NEGATIVE mg/dL
Leukocytes, UA: NEGATIVE
Nitrite: NEGATIVE
Protein, ur: 30 mg/dL — AB
Specific Gravity, Urine: 1.013 (ref 1.005–1.030)
UROBILINOGEN UA: 1 mg/dL (ref 0.0–1.0)
pH: 5 (ref 5.0–8.0)

## 2015-01-24 LAB — COMPREHENSIVE METABOLIC PANEL
ALK PHOS: 91 U/L (ref 38–126)
ALT: 36 U/L (ref 17–63)
AST: 57 U/L — ABNORMAL HIGH (ref 15–41)
Albumin: 2.5 g/dL — ABNORMAL LOW (ref 3.5–5.0)
Anion gap: 12 (ref 5–15)
BUN: 129 mg/dL — ABNORMAL HIGH (ref 6–20)
CALCIUM: 8 mg/dL — AB (ref 8.9–10.3)
CO2: 22 mmol/L (ref 22–32)
Chloride: 112 mmol/L — ABNORMAL HIGH (ref 101–111)
Creatinine, Ser: 2.71 mg/dL — ABNORMAL HIGH (ref 0.61–1.24)
GFR calc Af Amer: 28 mL/min — ABNORMAL LOW (ref >60)
GFR calc non Af Amer: 24 mL/min — ABNORMAL LOW (ref >60)
Glucose, Bld: 130 mg/dL — ABNORMAL HIGH (ref 65–99)
POTASSIUM: 3.7 mmol/L (ref 3.5–5.1)
SODIUM: 146 mmol/L — AB (ref 135–145)
TOTAL PROTEIN: 6.1 g/dL — AB (ref 6.5–8.1)
Total Bilirubin: 1.4 mg/dL — ABNORMAL HIGH (ref 0.3–1.2)

## 2015-01-24 LAB — URINE MICROSCOPIC-ADD ON

## 2015-01-24 LAB — HEMOGLOBIN A1C
HEMOGLOBIN A1C: 7.2 % — AB (ref 4.8–5.6)
Mean Plasma Glucose: 160 mg/dL

## 2015-01-24 LAB — GLUCOSE, CAPILLARY
GLUCOSE-CAPILLARY: 140 mg/dL — AB (ref 65–99)
Glucose-Capillary: 127 mg/dL — ABNORMAL HIGH (ref 65–99)
Glucose-Capillary: 138 mg/dL — ABNORMAL HIGH (ref 65–99)
Glucose-Capillary: 141 mg/dL — ABNORMAL HIGH (ref 65–99)
Glucose-Capillary: 146 mg/dL — ABNORMAL HIGH (ref 65–99)
Glucose-Capillary: 146 mg/dL — ABNORMAL HIGH (ref 65–99)

## 2015-01-24 LAB — PHOSPHORUS: Phosphorus: 6.6 mg/dL — ABNORMAL HIGH (ref 2.5–4.6)

## 2015-01-24 LAB — HEPARIN LEVEL (UNFRACTIONATED): Heparin Unfractionated: 0.18 IU/mL — ABNORMAL LOW (ref 0.30–0.70)

## 2015-01-24 LAB — VANCOMYCIN, RANDOM: Vancomycin Rm: 9 ug/mL

## 2015-01-24 LAB — PROCALCITONIN: Procalcitonin: 1.23 ng/mL

## 2015-01-24 MED ORDER — SODIUM CHLORIDE 0.9 % IV SOLN
1750.0000 mg | INTRAVENOUS | Status: DC
  Administered 2015-01-24: 1750 mg via INTRAVENOUS
  Filled 2015-01-24 (×2): qty 1750

## 2015-01-24 MED ORDER — DEXTROSE 5 % IV SOLN
1.0000 g | INTRAVENOUS | Status: DC
  Administered 2015-01-24 – 2015-01-25 (×2): 1 g via INTRAVENOUS
  Filled 2015-01-24 (×4): qty 1

## 2015-01-24 MED ORDER — HEPARIN (PORCINE) IN NACL 100-0.45 UNIT/ML-% IJ SOLN
1650.0000 [IU]/h | INTRAMUSCULAR | Status: DC
  Administered 2015-01-24: 1600 [IU]/h via INTRAVENOUS
  Administered 2015-01-25: 1650 [IU]/h via INTRAVENOUS
  Filled 2015-01-24 (×7): qty 250

## 2015-01-24 NOTE — Progress Notes (Signed)
PULMONARY / CRITICAL CARE MEDICINE   Name: [name] MRN: [id number] DOB: 07-Sep-1951    ADMISSION DATE:  01/20/2015  [facility]   CHIEF COMPLAINT:  Tracheostomy Dislodgement  INITIAL PRESENTATION: [name] initially admitted 4/22-5/14 for R rotator cuff repair by Ortho.  Failed extubation post-op and reintubated 4/23.  Post operative course complicated by prolonged respiratory respiratory failure, findings of testicular masses, acute renal failure requiring CVVHD, BLE DVT, and tracheostomy bleeding s/p revision.  [facility] and [facility] became dislodged, [facility] early am.    STUDIES:  4/23 US scrotum: Two solid extratesticular right scrotal masses. Primary differential diagnosis for extratesticular masses include primary or metastatic neoplasms including adenomatoid tumor, fibroma, leiomyoma, mesothelioma, or sarcoma. Urology consultation is recommended  4/26 TEE: LVEF 60-65%, Grade 1 DD, trivial pericardial effusion 4/27 US testes with masses 5/02 renal US: no hydronephrosis, Possible nonobstructing stone lower pole left kidney  SIGNIFICANT EVENTS: 4/22 - 5/14  Admit for R rotator cuff repair, complicated course with prolonged resp fx, renal fx, DVT, testicular masses 5/17 re-do tracheostomy, HCAP  SUBJECTIVE:   Comfortable on ATC. RASS -1. + F/C   VITAL SIGNS: Temp:  [98.8 F (37.1 C)-100.8 F (38.2 C)] 100.7 F (38.2 C) (05/19 0759) Pulse Rate:  [24-98] 90 (05/19 0759) Resp:  [15-29] 18 (05/19 0759) BP: (91-157)/(39-82) 136/82 mmHg (05/19 0759) SpO2:  [94 %-100 %] 95 % (05/19 0924) FiO2 (%):  [28 %-35 %] 28 % (05/19 0924)   HEMODYNAMICS:     VENTILATOR SETTINGS: Vent Mode:  [-]  FiO2 (%):  [28 %-35 %] 28 %   INTAKE / OUTPUT:  Intake/Output Summary (Last 24 hours) at 01/24/15 1048 Last data filed at 01/24/15 0800  Gross per 24 hour  Intake   8970 ml  Output   2335 ml  Net   6635 ml    PHYSICAL  EXAMINATION:  Gen: RASS -1, NAD HENT: NCAT, trach site clean  PULM: Clear anteriorly CV: RRR, no M GI: obese, BS+, soft, nontender MSK: R shoulder without redness, swelling, sling in place Derm: mild redness near PICC site, no drainage Neuro: no focal deficits  LABS:  CBC  Recent Labs Lab 01/22/15 1015 01/23/15 0430 01/24/15 0342  WBC 15.3* 14.1* 11.8*  HGB 11.3* 10.5* 10.2*  HCT 36.3* 33.7* 33.5*  PLT 188 149* 138*   Coag's No results for input(s): APTT, INR in the last 168 hours.   BMET  Recent Labs Lab 01/22/15 1015 01/23/15 0430 01/24/15 0342  NA 146* 142 146*  K 5.0 4.0 3.7  CL 104 103 112*  CO2 23 26 22   BUN 173* 107* 129*  CREATININE 5.58* 3.40* 2.71*  GLUCOSE 133* 155* 130*   Electrolytes  Recent Labs Lab 01/22/15 0428 01/22/15 1015 01/22/15 2200 01/23/15 0430 01/23/15 1028 01/23/15 2220 01/24/15 0342  CALCIUM 9.4 8.9  --  8.6*  --   --  8.0*  MG  --  3.0* 2.3  --  2.4 2.3  --   PHOS 10.6* 11.7*  --   --   --   --  6.6*   Sepsis Markers  Recent Labs Lab 01/22/15 1015 01/23/15 0430 01/24/15 0342  PROCALCITON 3.32 2.54 1.23     ABG  Recent Labs Lab 01/21/15 0401  PHART 7.413  PCO2ART 37.6  PO2ART 89.0   Liver Enzymes  Recent Labs Lab 01/22/15 0428 01/22/15 1015 01/24/15 0342  AST  --   --  57*  ALT  --   --  36  ALKPHOS  --   --  91  BILITOT  --   --  1.4*  ALBUMIN 3.0* 2.7* 2.5*   Cardiac Enzymes No results for input(s): TROPONINI, PROBNP in the last 168 hours.   Glucose  Recent Labs Lab 01/23/15 1526 01/23/15 1707 01/23/15 1930 01/23/15 2346 01/24/15 0410 01/24/15 0756  GLUCAP 135* 128* 122* 141* 127* 146*    CXR: CM, IS prominence, PTX not seen on present film   ASSESSMENT / PLAN:  PULMONARY A: Chronic Hypoxic / Hypercapneic Respiratory Failure Presumed OSA / OHS Tracheostomy tube dependence S/p redo tracheostomy 5/17 R Pneumothorax (5/15) > resolved Smoker  Possible HCAP,  5/17 P:   Cont  PRN vent - settings reviewed Cont PSV > ATC as long as tolerated  CARDIOVASCULAR A:  Hx of HTN, chronic dCHF, HLD Extensive BLE DVT s/p IVC Filter placement  P:  Restart IV UFH 5/19 without bolus Would plan to remove IVC filter eventually if tolerating anticoagulation   Continue prn hydralazine  RENAL Tunneled HD Cath 5/13 >>  A:  AKI, now nonoliguric Scrotal mass - AFP and beta HCG normal P:  Monitor BMET and UOP Replace electrolytes as needed HD per Renal When recovers sufficiently from Germer illness, consider Urology eval for scrotal mass  GASTROINTESTINAL A:   High GRVs - recurrent Constipation, resolved P:  SUP: enteral famotidine Continue TFs Cont EES as motility agent (started 5/09)  HEMATOLOGIC A:   Polycythemia - likely chronic hypoxemia. Resolved Anemia - ICU acquired + acute blood loss with tracheostomy bleeding P:  DVT px: full dose UFH  Maintain IVC filter until tolerating anticoagulation, then remove Monitor CBC intermittently Transfuse per usual ICU guidelines  INFECTIOUS A:  Fever 5/16 Suspected HCAP /12 blood GPC > contaminant P:    5/16 blood cx >> 1/2 GPC 5/16 resp cx >>  5/17 vanc >>  5/17 cefepime >>  Cont Hagg abx Following PCT  ENDOCRINE A:  Hyperglycemia without prior documentation of DM P:  Sensitive SSI  Check Hgb A1c  NEUROLOGIC A:  Acute encephalopathy, resolved Post op pain Severe deconditioning P:  RASS goal 0  Cont PRN fent, PRN lorazepam PT eval and rx  ORTHO A: Rt shoulder surgery 4/22 P: Post op mgmt per Ortho  Continue ROM of RUL    Nicholas Fischeravid Eletha Culbertson, MD ; [facility] service Mobile [phone].  After 5:30 PM or weekends, call [phone]

## 2015-01-24 NOTE — Evaluation (Signed)
Physical Therapy Evaluation Patient Details Name: [name] MRN: [id number] DOB: 07-Mar-1951 Today's Date: 01/24/2015   History of Present Illness  64 y.o. M initially admitted 4/22-5/14 for R rotator cuff repair by Ortho. Failed extubation post-op and reintubated 4/23. Post operative course complicated by prolonged respiratory respiratory failure, findings of testicular masses, acute renal failure requiring CVVHD, BLE DVT, and tracheostomy bleeding s/p revision. [facility] and [facility] became dislodged, [facility].   Clinical Impression  Patient presents with problems listed below.  Will benefit from acute PT to maximize functional mobility prior to discharge.  [facility].    Follow Up Recommendations LTACH;Supervision/Assistance - 24 hour    Equipment Recommendations  Other (comment) (TBD)    Recommendations for Other Services       Precautions / Restrictions Precautions Precautions: Fall;Shoulder Type of Shoulder Precautions: See OT notes Shoulder Interventions: Shoulder abduction pillow Precaution Booklet Issued: No Precaution Comments: shoulder, abductor sling on RUE, except for PROM to elbow and wrist; then removed for this per MD Required Braces or Orthoses: Sling (with Abd pillow) Restrictions Weight Bearing Restrictions: Yes RUE Weight Bearing: Non weight bearing      Mobility  Bed Mobility Overal bed mobility: Needs Assistance;+2 for physical assistance Bed Mobility: Rolling Rolling: Total assist;+2 for physical assistance         General bed mobility comments: Patient did not follow commands to initiate rolling, or assist once rolling initiated.  Required +2 total assist for rolling to both sides.  Provided pericare folling BM.  Unable to attempt sitting EOB due to decreased level of arousal/safety.  Transfers                 General transfer  comment: NT due to safety and limited arousal at this time.  Ambulation/Gait                Stairs            Wheelchair Mobility    Modified Rankin (Stroke Patients Only)       Balance                                             Pertinent Vitals/Pain Pain Assessment: Faces Faces Pain Scale: No hurt    Home Living Family/patient expects to be discharged to:: Other (Comment) (LTACH) Living Arrangements: Other relatives                    Prior Function Level of Independence: Needs assistance         Comments: [facility] prior to readmission to acute hospital.     Hand Dominance        Extremity/Trunk Assessment   Upper Extremity Assessment: Defer to OT evaluation           Lower Extremity Assessment: RLE deficits/detail;LLE deficits/detail RLE Deficits / Details: Noted spontaneous movement along bed.  Did not note movement against gravity.  Did not follow commands to MMT. LLE Deficits / Details: No spontaneous movement noted.  Patient not following commands to MMT.     Communication   Communication: Tracheostomy  Cognition Arousal/Alertness: Lethargic Behavior During Therapy: Flat affect (Decreased arousal) Overall Cognitive Status: Difficult to assess  General Comments      Exercises        Assessment/Plan    PT Assessment Patient needs continued PT services  PT Diagnosis Difficulty walking;Generalized weakness;Altered mental status   PT Problem List Decreased strength;Decreased activity tolerance;Decreased balance;Decreased mobility;Decreased cognition;Decreased safety awareness;Cardiopulmonary status limiting activity;Obesity  PT Treatment Interventions Functional mobility training;Therapeutic activities;Therapeutic exercise;Cognitive remediation;Patient/family education   PT Goals ([place] goals can be found in the Care Plan section) Acute Rehab  PT Goals Patient Stated Goal: unable to state PT Goal Formulation: Patient unable to participate in goal setting Time For Goal Achievement: 02/07/15 Potential to Achieve Goals: Fair    Frequency Min 2X/week   Barriers to discharge        Co-evaluation PT/OT/SLP Co-Evaluation/Treatment: Yes Reason for Co-Treatment: Complexity of the patient's impairments (multi-system involvement);Necessary to address cognition/behavior during functional activity;For patient/therapist safety PT goals addressed during session: Mobility/safety with mobility OT goals addressed during session: ADL's and self-care;Strengthening/ROM       End of Session Equipment Utilized During Treatment: Oxygen Activity Tolerance: Patient limited by lethargy Patient left: in bed;with call bell/phone within reach;with nursing/sitter in room;with restraints reapplied (Mit on Lt hand) Nurse Communication: Mobility status;Need for lift equipment         Time: [id number] PT Time Calculation (min) (ACUTE ONLY): 23 min   Charges:   PT Evaluation $Initial PT Evaluation Tier I: 1 Procedure     PT G Codes:        Vena AustriaDavis, Luby Seamans H 01/24/2015, 1:55 PM Durenda HurtSusan H. Renaldo Fiddleravis, PT, [facility] Acute Rehab Services Pager [phone]

## 2015-01-24 NOTE — Progress Notes (Signed)
Subjective: Interval History: has no complaint .  Objective: Vital signs in last 24 hours: Temp:  [98.8 F (37.1 C)-100.8 F (38.2 C)] 100.5 F (38.1 C) (05/19 0600) Pulse Rate:  [75-98] 79 (05/19 0600) Resp:  [15-29] 20 (05/19 0600) BP: (89-157)/(39-78) 123/68 mmHg (05/19 0600) SpO2:  [94 %-100 %] 94 % (05/19 0600) FiO2 (%):  [28 %-40 %] 28 % (05/19 0400) Weight change:   Intake/Output from previous day: 05/18 0701 - 05/19 0700 In: 9130 [I.V.:8060; NG/GT:1070] Out: 2260 [Urine:2260] Intake/Output this shift:    General appearance: cooperative, morbidly obese, pale and awake but not coop Resp: diminished breath sounds bilaterally and rales bibasilar Chest wall: RIJ cath Cardio: S1, S2 normal and systolic murmur: holosystolic 2/6, blowing at apex GI: obese, pos bs, soft Extremities: edema 2-3+  Lab Results:  Recent Labs  01/23/15 0430 01/24/15 0342  WBC 14.1* 11.8*  HGB 10.5* 10.2*  HCT 33.7* 33.5*  PLT 149* 138*   BMET:  Recent Labs  01/23/15 0430 01/24/15 0342  NA 142 146*  K 4.0 3.7  CL 103 112*  CO2 26 22  GLUCOSE 155* 130*  BUN 107* 129*  CREATININE 3.40* 2.71*  CALCIUM 8.6* 8.0*   No results for input(s): PTH in the last 72 hours. Iron Studies: No results for input(s): IRON, TIBC, TRANSFERRIN, FERRITIN in the last 72 hours.  Studies/Results: Dg Chest Port 1 View  01/23/2015   CLINICAL DATA:  Hypoxia, tracheostomy.  EXAM: PORTABLE CHEST - 1 VIEW  COMPARISON:  01/22/2015.  FINDINGS: Tracheostomy is midline. Nasogastric tube is followed into the stomach. Right IJ dialysis catheter tips project over the SVC RA junction and right atrium, respectively. Left PICC tip projects over the SVC. Heart is enlarged, stable. Lungs are somewhat low in volume with mild mixed interstitial and airspace disease, improved from the prior exam. Tiny right apical pneumothorax reported on the prior exam is not readily visualized today. Left costophrenic angle was not included  on the image.  IMPRESSION: 1. Mild pulmonary edema, improved. 2. Previously reported tiny right apical pneumothorax is not readily visualized.   Electronically Signed   By: Leanna BattlesMelinda  Blietz M.D.   On: 01/23/2015 07:09    I have reviewed the patient's Fromer medications.  Assessment/Plan: 1 AKI cr better, more acidemic,.  Given a lot of vol.  ? Dilutional effect on chem.  Need to allow to diurese. Will hold ivf and follow urine and chem 2 COPD 3 Morbid obesity 4 Trach  5 shoulder surgery 6  OHS 7 anemia 8 Fever  G pos in blood.  On AB 9 LE DVTS s/p umbrella P hold ivf, follow chem, culltures, mobilize    LOS: 4 days   [place],Penn L 01/24/2015,7:02 AM

## 2015-01-24 NOTE — Progress Notes (Signed)
Occupational Therapy Treatment Patient Details Name: [name] MRN: [id number] DOB: Apr 08, 1951 Today's Date: 01/24/2015    History of present illness 64 y.o. M initially admitted 4/22-5/14 for R rotator cuff repair by Ortho. Failed extubation post-op and reintubated 4/23. Post operative course complicated by prolonged respiratory respiratory failure, findings of testicular masses, acute renal failure requiring CVVHD, BLE DVT, and tracheostomy bleeding s/p revision. He was transferred to [facility] 5/14 PM and tracheostomy became dislodged, [facility]15 early am.    OT comments  Pt seen today by PT/OT. Pt able to open eyes to voice and followed command to look at OT with his eyes. Pt difficult to arouse. Pt required total A +3 to roll in bed for pericare. Pt will continue to benefit from acute OT to address mobility and ROM of UEs. Continue to recommend LTACH.    Follow Up Recommendations  LTACH    Equipment Recommendations  Other (comment) (defer to next venue)    Recommendations for Other Services      Precautions / Restrictions Precautions Precautions: Fall;Shoulder Type of Shoulder Precautions: Per order, ROM of wrist and elbow out of sling daily. Skin care and monitoring. Passive Right shoulder ROM out of the sling BID. Shoulder Interventions: Shoulder abduction pillow Precaution Booklet Issued: No Precaution Comments: shoulder, abductor sling on RUE, except for PROM to elbow and wrist; then removed for this per MD Required Braces or Orthoses: Sling (with abductor pillow) Restrictions Weight Bearing Restrictions: Yes RUE Weight Bearing: Non weight bearing       Mobility Bed Mobility Overal bed mobility: Needs Assistance Bed Mobility: Rolling Rolling: Total assist;+2 for physical assistance         General bed mobility comments: Pt rolled with total Assist of +3 to perform pericare and change linens due to bowel movement.   Transfers                 General transfer comment: NT due to safety and limited arousal at this time.         ADL Overall ADL's : Needs assistance/impaired Eating/Feeding: NPO   Grooming: Total assistance;Bed level                       Toileting- Clothing Manipulation and Hygiene: Total assistance;+2 for physical assistance;Bed level (+3 assist to roll and provide pericare)         General ADL Comments: Pt able to open eyes in response to therapist's voice. Pt able to follow command to look at OT by moving only his eyes. Spontaneous movement of RLE noted, however no other attempts to follow commands or spontaneous movement. Pt was soiled with bowel movement and PT, OT, and RN performed pericare and linen change with +3 total assist to roll in bed and perform hygiene. Repositioned in bed for pressure relief and correct RUE positioning.                 Cognition  Arousal/Alertness:Awake/Alert Behavior During Therapy: Flat affect Overall Cognitive Status: Difficult to assess                                    Pertinent Vitals/ Pain       Pain Assessment: Faces Faces Pain Scale: No hurt  Home Living Family/patient expects to be discharged to:: Unsure  Frequency Min 3X/week     Progress Toward Goals  OT Goals([place] goals can now be found in the care plan section)  Progress towards OT goals: Progressing toward goals (slowly)     Plan Discharge plan remains appropriate    Co-evaluation    PT/OT/SLP Co-Evaluation/Treatment: Yes Reason for Co-Treatment: Complexity of the patient's impairments (multi-system involvement);For patient/therapist safety;Necessary to address cognition/behavior during functional activity   OT goals addressed during session: ADL's and self-care;Strengthening/ROM      End of Session Equipment Utilized During Treatment: Other (comment) (sling with abduction pillow)   Activity  Tolerance Patient tolerated treatment well   Patient Left in bed;with call bell/phone within reach   Nurse Communication Other (comment) (pt had bowel movement; minimally alert)        Time: [id number] OT Time Calculation (min): 23 min  Charges: OT General Charges $OT Visit: 1 Procedure OT Treatments $Self Care/Home Management : 8-22 mins  Nena JordanMiller, Ireanna Finlayson M 01/24/2015, 12:09 PM  Carney LivingLeeAnn Marie Sherri Mcarthy, OTR/L Occupational Therapist [phone] (pager)

## 2015-01-24 NOTE — Progress Notes (Signed)
Pt has remained on ATC throughout night without any complication. RT has assessed PT multiple times throughout the night. PT has rested well. ATC decreased to 28% and Sats remain 97-99%. Vent is at bedside if needed. Last check this AM: HR: 92, RR: 25, Sats: 96%. Minimal secretions have been suctioned as pt needed. RN aware to contact RT if any changes occur. RT will monitor.

## 2015-01-24 NOTE — Progress Notes (Addendum)
ANTICOAGULATION CONSULT NOTE AND ANTIBIOTIC FOLLOW-UP - Initial Consult  Pharmacy Consult for Heparin/Vancomycin/Cefepime Indication: DVT treatment/HCAP   No Known Allergies  Patient Measurements: Height: 5\' 11"  (180.3 cm) Weight:  (weight on bed malfunction) IBW/kg (Calculated) : 75.3 Heparin Dosing Weight: 109 kg  Vital Signs: Temp: 100.8 F (38.2 C) (05/19 1134) Temp Source: Core (Comment) (05/19 0737) BP: 137/84 mmHg (05/19 1134) Pulse Rate: 90 (05/19 1134)  Labs:  Recent Labs  01/22/15 1015 01/23/15 0430 01/24/15 0342  HGB 11.3* 10.5* 10.2*  HCT 36.3* 33.7* 33.5*  PLT 188 149* 138*  CREATININE 5.58* 3.40* 2.71*    Estimated Creatinine Clearance: 41.9 mL/min (by C-G formula based on Cr of 2.71).   Medical History: Past Medical History  Diagnosis Date  . Arthritis   . RCT (rotator cuff tear)   . Multiple abrasions     rt arm  . Morbid obesity     Medications:  Scheduled:  . antiseptic oral rinse  7 mL Mouth Rinse q12n4p  . chlorhexidine  15 mL Mouth Rinse BID  . erythromycin  400 mg Per Tube 3 times per day  . famotidine  20 mg Per Tube Daily  . feeding supplement (NEPRO CARB STEADY)  1,000 mL Per Tube Q24H  . feeding supplement (PRO-STAT SUGAR FREE 64)  60 mL Per Tube 5 X Daily  . folic acid  1 mg Oral Daily  . heparin  40 Units/kg Dialysis Once in dialysis  . insulin aspart  0-9 Units Subcutaneous 6 times per day  . lanthanum  1,000 mg Oral TID WC    Assessment: 64 yo m readmitted on 5/15 for trach dislodgement after being discharged the day before. Patient has an extensive history last admission with respiratory failure requiring eventual tracheostomy.  Patient developed bilateral DVTs and was started on heparin last admission. He then had to go to the OR emergently for bleeding from trach site.  Pharmacy is consulted to restart heparin this AM.  Patient was previously therapeutic on 1600 units/hr.  Will start at the same dose with no bolus and watch  carefully for any bleeding. Hgb 10.2 stable, plts 138, no issues or bleeding this admission.  Pharmacy is also consulted to dose vancomycin and cefepime for HCAP/VAP. Patient received vancomycin 2500 mg x 1 and cefepime 2 gm x 1 on 5/17. VR this AM is 9. Patient also has an AKI d/t shock that is slowly improving, SCr is 2.71 today, CrCl ~42 ml/min. Wbc 11.8, tmax 100.9.   5/16 BCx: CoNS in 1/2 cultures (likely contaminant) 5/16 trach: few candida albicans  Goal of Therapy:  Heparin level 0.3-0.7 units/ml Monitor platelets by anticoagulation protocol: Yes  Vancomycin trough 15-20 mcg/mL   Plan:  No bolus Heparin 1600 units/hr 6-hr HL @ 1830 Vancomycin 1750 mg q24h Cefepime 1 gm IV q24h VT again at Css Daily HL and CBC Monitor hgb/plts, s/s of bleeding closely, cultures, renal function, CBC, clinical course  Cassie L. Roseanne RenoStewart, PharmD Clinical Pharmacy Resident Pager: [phone] 01/24/2015 12:10 PM

## 2015-01-24 NOTE — Progress Notes (Signed)
Occupational Therapy Treatment Patient Details Name: Fayrene FearingJames Corkery MRN: [id number] DOB: [birth year] Today's Date: 01/24/2015    History of present illness 64 y.o. M initially admitted 4/22-5/14 for R rotator cuff repair by Ortho. Failed extubation post-op and reintubated 4/23. Post operative course complicated by prolonged respiratory respiratory failure, findings of testicular masses, acute renal failure requiring CVVHD, BLE DVT, and tracheostomy bleeding s/p revision. [facility] and tracheostomy became dislodged, [facility] am.    OT comments  Pt tolerated PROM Rt shoulder and elbow.   Follow Up Recommendations  LTACH    Equipment Recommendations  None recommended by OT    Recommendations for Other Services      Precautions / Restrictions Precautions Precautions: Fall;Shoulder Type of Shoulder Precautions: Per order, ROM of wrist and elbow out of sling daily. Skin care and monitoring. Passive Right shoulder ROM out of the sling BID. Shoulder Interventions: Shoulder abduction pillow Precaution Booklet Issued: No Precaution Comments: shoulder, abductor sling on RUE, except for PROM to elbow and wrist; then removed for this per MD Required Braces or Orthoses: Sling (with abductor pillow) Restrictions Weight Bearing Restrictions: Yes RUE Weight Bearing: Non weight bearing       Mobility Bed Mobility                  Transfers                      Balance                                   ADL                                                Vision                     Perception     Praxis      Cognition   Behavior During Therapy: Flat affect Overall Cognitive Status: Difficult to assess                       Extremity/Trunk Assessment               Exercises General Exercises - Upper Extremity Shoulder Flexion: PROM;Right;15 reps Shoulder  ABduction: PROM;Right;15 reps Shoulder Horizontal ABduction: PROM;Right;15 reps Shoulder Horizontal ADduction: PROM;Right;15 reps Elbow Flexion: PROM;Right;15 reps Elbow Extension: PROM;Right;15 reps Wrist Flexion: PROM;Right;15 reps Wrist Extension: PROM;Right;15 reps Digit Composite Flexion: PROM;Right;15 reps Composite Extension: PROM;Right;15 reps Shoulder Exercises Shoulder External Rotation: PROM;Right;15 reps Hand Exercises Forearm Supination: PROM;Right;15 reps Forearm Pronation: PROM;Right;15 reps   Shoulder Instructions       General Comments      Pertinent Vitals/ Pain       Pain Assessment: Faces Faces Pain Scale: No hurt  Home Living                                          Prior Functioning/Environment              Frequency Min 6X/week (for PROM Rt shoulder)     Progress Toward Goals  OT Goals(Van  goals can now be found in the care plan section)  Progress towards OT goals: Progressing toward goals  ADL Goals Additional ADL Goal #1: Pt will follow commands 50% of the time. Additional ADL Goal #2: Pt will perform bed mobility with Max assist. Additional ADL Goal #3: Pt will perform bilateral UE ROM with Max assist.  Plan Frequency needs to be updated    Co-evaluation                 End of Session Equipment Utilized During Treatment: Oxygen   Activity Tolerance Patient tolerated treatment well   Patient Left in bed   Nurse Communication          Time: [id number] OT Time Calculation (min): 18 min  Charges: OT General Charges $OT Visit: 1 Procedure OT Treatments $Therapeutic Exercise: 8-22 mins  Allina Riches M 01/24/2015, 5:08 PM

## 2015-01-24 NOTE — Progress Notes (Signed)
ANTICOAGULATION CONSULT NOTE - Follow Up Consult  Pharmacy Consult for heparin Indication: DVT  No Known Allergies  Patient Measurements: Height: 5\' 11"  (180.3 cm) Weight:  (weight on bed malfunction) IBW/kg (Calculated) : 75.3 Heparin Dosing Weight: 109 kg  Vital Signs: Temp: 101.4 F (38.6 C) (05/19 1930) Temp Source: Core (Comment) (05/19 1930) BP: 139/76 mmHg (05/19 1930) Pulse Rate: 93 (05/19 1930)  Labs:  Recent Labs  01/22/15 1015 01/23/15 0430 01/24/15 0342 01/24/15 1923  HGB 11.3* 10.5* 10.2*  --   HCT 36.3* 33.7* 33.5*  --   PLT 188 149* 138*  --   HEPARINUNFRC  --   --   --  0.18*  CREATININE 5.58* 3.40* 2.71*  --     Estimated Creatinine Clearance: 41.9 mL/min (by C-G formula based on Cr of 2.71).   Medications:  Scheduled:  . antiseptic oral rinse  7 mL Mouth Rinse q12n4p  . ceFEPime (MAXIPIME) IV  1 g Intravenous Q24H  . chlorhexidine  15 mL Mouth Rinse BID  . erythromycin  400 mg Per Tube 3 times per day  . famotidine  20 mg Per Tube Daily  . feeding supplement (NEPRO CARB STEADY)  1,000 mL Per Tube Q24H  . feeding supplement (PRO-STAT SUGAR FREE 64)  60 mL Per Tube 5 X Daily  . folic acid  1 mg Oral Daily  . heparin  40 Units/kg Dialysis Once in dialysis  . insulin aspart  0-9 Units Subcutaneous 6 times per day  . lanthanum  1,000 mg Oral TID WC  . vancomycin  1,750 mg Intravenous Q24H   Infusions:  . heparin 1,600 Units/hr (01/24/15 1227)    Assessment: 64 yo male is currently on subtherapeutic heparin for DVT.   Heparin level is 0.18.  No problem with infusion per RN.  Goal of Therapy:  Heparin level 0.3-0.7 units/ml Monitor platelets by anticoagulation protocol: Yes   Plan:  - increase heparin to 1800 units/hr - 6hr heparin level  Myrian Botello, Tsz-Yin 01/24/2015,8:04 PM

## 2015-01-25 DIAGNOSIS — J9622 Acute and chronic respiratory failure with hypercapnia: Secondary | ICD-10-CM

## 2015-01-25 LAB — RENAL FUNCTION PANEL
Albumin: 2.5 g/dL — ABNORMAL LOW (ref 3.5–5.0)
Anion gap: 10 (ref 5–15)
BUN: 128 mg/dL — AB (ref 6–20)
CALCIUM: 9 mg/dL (ref 8.9–10.3)
CO2: 24 mmol/L (ref 22–32)
CREATININE: 2.16 mg/dL — AB (ref 0.61–1.24)
Chloride: 114 mmol/L — ABNORMAL HIGH (ref 101–111)
GFR calc Af Amer: 36 mL/min — ABNORMAL LOW (ref >60)
GFR calc non Af Amer: 31 mL/min — ABNORMAL LOW (ref >60)
Glucose, Bld: 156 mg/dL — ABNORMAL HIGH (ref 65–99)
Phosphorus: 5.6 mg/dL — ABNORMAL HIGH (ref 2.5–4.6)
Potassium: 3.7 mmol/L (ref 3.5–5.1)
Sodium: 148 mmol/L — ABNORMAL HIGH (ref 135–145)

## 2015-01-25 LAB — GLUCOSE, CAPILLARY
GLUCOSE-CAPILLARY: 130 mg/dL — AB (ref 65–99)
GLUCOSE-CAPILLARY: 130 mg/dL — AB (ref 65–99)
Glucose-Capillary: 149 mg/dL — ABNORMAL HIGH (ref 65–99)
Glucose-Capillary: 136 mg/dL — ABNORMAL HIGH (ref 65–99)
Glucose-Capillary: 159 mg/dL — ABNORMAL HIGH (ref 65–99)
Glucose-Capillary: 154 mg/dL — ABNORMAL HIGH (ref 65–99)

## 2015-01-25 LAB — CBC
HCT: 35.5 % — ABNORMAL LOW (ref 39.0–52.0)
Hemoglobin: 10.8 g/dL — ABNORMAL LOW (ref 13.0–17.0)
MCH: 30.3 pg (ref 26.0–34.0)
MCHC: 30.4 g/dL (ref 30.0–36.0)
MCV: 99.4 fL (ref 78.0–100.0)
PLATELETS: 138 10*3/uL — AB (ref 150–400)
RBC: 3.57 MIL/uL — ABNORMAL LOW (ref 4.22–5.81)
RDW: 16.1 % — ABNORMAL HIGH (ref 11.5–15.5)
WBC: 11.6 10*3/uL — AB (ref 4.0–10.5)

## 2015-01-25 LAB — HEPARIN LEVEL (UNFRACTIONATED)
HEPARIN UNFRACTIONATED: 0.41 [IU]/mL (ref 0.30–0.70)
Heparin Unfractionated: 0.73 IU/mL — ABNORMAL HIGH (ref 0.30–0.70)

## 2015-01-25 NOTE — Progress Notes (Signed)
ANTICOAGULATION CONSULT NOTE - Follow Up Consult  Pharmacy Consult for heparin Indication: DVT  Labs:  Recent Labs  01/22/15 1015 01/23/15 0430 01/24/15 0342 01/24/15 1923 01/25/15 0248  HGB 11.3* 10.5* 10.2*  --  10.8*  HCT 36.3* 33.7* 33.5*  --  35.5*  PLT 188 149* 138*  --  138*  HEPARINUNFRC  --   --   --  0.18* 0.41  CREATININE 5.58* 3.40* 2.71*  --   --       Assessment/Plan:  64yo male therapeutic on heparin after rate increase. Will continue gtt at [place] rate and confirm stable with additional level.   Vernard GamblesVeronda Yashvi Jasinski, PharmD, BCPS  01/25/2015,3:47 AM

## 2015-01-25 NOTE — Progress Notes (Signed)
Subjective: Interval History: has no complaint, not coop.  Objective: Vital signs in last 24 hours: Temp:  [100.6 F (38.1 C)-101.7 F (38.7 C)] 101.7 F (38.7 C) (05/20 0414) Pulse Rate:  [24-96] 89 (05/20 0414) Resp:  [18-29] 23 (05/20 0414) BP: (125-156)/(59-84) 141/80 mmHg (05/20 0414) SpO2:  [93 %-97 %] 96 % (05/20 0414) FiO2 (%):  [28 %] 28 % (05/20 0342) Weight:  [148 kg (326 lb 4.5 oz)] 148 kg (326 lb 4.5 oz) (05/20 0500) Weight change:   Intake/Output from previous day: 05/19 0701 - 05/20 0700 In: 1567.7 [I.V.:387.7; NG/GT:680; IV Piggyback:500] Out: 4200 [Urine:4200] Intake/Output this shift:    General appearance: morbidly obese, pale and not coop,  Resp: diminished breath sounds bilaterally and rhonchi bilaterally Cardio: S1, S2 normal and systolic murmur: holosystolic 2/6, blowing at apex GI: obese, pos bs, liver down 6 cm Extremities: edema 3+  Trach on collar  Lab Results:  Recent Labs  01/24/15 0342 01/25/15 0248  WBC 11.8* 11.6*  HGB 10.2* 10.8*  HCT 33.5* 35.5*  PLT 138* 138*   BMET:  Recent Labs  01/24/15 0342 01/25/15 0248  NA 146* 148*  K 3.7 3.7  CL 112* 114*  CO2 22 24  GLUCOSE 130* 156*  BUN 129* 128*  CREATININE 2.71* 2.16*  CALCIUM 8.0* 9.0   No results for input(s): PTH in the last 72 hours. Iron Studies: No results for input(s): IRON, TIBC, TRANSFERRIN, FERRITIN in the last 72 hours.  Studies/Results: No results found.  I have reviewed the patient's [place] medications.  Assessment/Plan: 1 AKI nonoliguric, vol xs, acid/base/K ok.  Recovering 2 Fever Gr pos in blood.  Urine ok.  ? Other source . 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  Vanc , TF    LOS: 5 days   [name],Bassem L 01/25/2015,7:17 AM

## 2015-01-25 NOTE — Progress Notes (Signed)
Occupational Therapy Treatment Patient Details Name: [name] MRN: [id number] DOB: 02/12/1951 Today's Date: 01/25/2015    History of present illness 64 y.o. M initially admitted 4/22-5/14 for R rotator cuff repair by Ortho. Failed extubation post-op and reintubated 4/23. Post operative course complicated by prolonged respiratory respiratory failure, findings of testicular masses, acute renal failure requiring CVVHD, BLE DVT, and tracheostomy bleeding s/p revision. He was transferred to [facility] 5/14 PM and tracheostomy became dislodged, [facility] early am.    OT comments  Grimaced with initial FF of  L shoulder, but then tolerated well. Also performed PROM of L shoulder. Pt opening eyes while being bathed, but not following commands. Will continue to follow.  Follow Up Recommendations  LTACH    Equipment Recommendations  None recommended by OT    Recommendations for Other Services      Precautions / Restrictions Precautions Precautions: Fall;Shoulder Type of Shoulder Precautions: Per order, ROM of wrist and elbow out of sling daily. Skin care and monitoring. Passive Right shoulder ROM out of the sling BID. Shoulder Interventions: Shoulder abduction pillow Precaution Comments: shoulder, abductor sling on RUE, except for PROM to elbow and wrist; then removed for this per MD Required Braces or Orthoses: Sling Restrictions Weight Bearing Restrictions: Yes RUE Weight Bearing: Non weight bearing       Mobility Bed Mobility Overal bed mobility: +2 for physical assistance Bed Mobility: Rolling Rolling: Total assist;+2 for physical assistance            Transfers                      Balance                                   ADL Overall ADL's : Needs assistance/impaired                                       General ADL Comments: Assisted 2 nurse techs in rolling pt and performing bathing and dressing, requires  total assist.      Vision                     Perception     Praxis      Cognition   Behavior During Therapy: Flat affect Overall Cognitive Status: Difficult to assess                       Extremity/Trunk Assessment               Exercises General Exercises - Upper Extremity Shoulder Flexion: PROM;Right;10 reps Shoulder ABduction: PROM;Right;10 reps Shoulder ADduction: PROM;Right;10 reps Shoulder Horizontal ABduction: PROM;Right;10 reps Shoulder Horizontal ADduction: PROM;Right;10 reps Elbow Flexion: PROM;Right;10 reps Elbow Extension: PROM;Right;10 reps Wrist Flexion: PROM;Right;10 reps Wrist Extension: PROM;Right;10 reps Digit Composite Flexion: PROM;Right;10 reps Composite Extension: PROM;Right;10 reps Shoulder Exercises Shoulder External Rotation: PROM;Right;10 reps Hand Exercises Forearm Supination: PROM;Right;10 reps Forearm Pronation: PROM;Right;10 reps Other Exercises Other Exercises: PROM L shoulder all areas x 10   Shoulder Instructions       General Comments      Pertinent Vitals/ Pain       Pain Assessment: Faces Faces Pain Scale: Hurts a little bit Pain Location: with R shoulder passive FF initially Pain Descriptors / Indicators: Grimacing  Pain Intervention(s): Limited activity within patient's tolerance;Monitored during session;Repositioned  Home Living                                          Prior Functioning/Environment              Frequency Min 6X/week (for PROM R shoulder)     Progress Toward Goals  OT Goals([place] goals can now be found in the care plan section)  Progress towards OT goals: Not progressing toward goals - comment (remains lethargic, responds only to pain)  Acute Rehab OT Goals Patient Stated Goal: unable to state  Plan Discharge plan remains appropriate    Co-evaluation                 End of Session Equipment Utilized During Treatment: Oxygen   Activity  Tolerance Patient tolerated treatment well   Patient Left in bed   Nurse Communication          Time: 4098-11910857-0935 OT Time Calculation (min): 38 min  Charges: OT General Charges $OT Visit: 1 Procedure OT Treatments $Self Care/Home Management : 23-37 mins $Therapeutic Exercise: 8-22 mins  Evern BioMayberry, Kionna Brier Lynn 01/25/2015, 10:27 AM  [phone]

## 2015-01-25 NOTE — Progress Notes (Signed)
PULMONARY / CRITICAL CARE MEDICINE   Name: [name] MRN: [id number] DOB: [birth year]    ADMISSION DATE:  01/20/2015  [facility]   CHIEF COMPLAINT:  Tracheostomy Dislodgement  INITIAL PRESENTATION: [name] initially admitted 4/22-5/14 for R rotator cuff repair by Ortho.  Failed extubation post-op and reintubated 4/23.  Post operative course complicated by prolonged respiratory respiratory failure, findings of testicular masses, acute renal failure requiring CVVHD, BLE DVT, and tracheostomy bleeding s/p revision.  [facility] and tracheostomy became dislodged, [facility] early am.    STUDIES:  4/23 US scrotum: Two solid extratesticular right scrotal masses. Primary differential diagnosis for extratesticular masses include primary or metastatic neoplasms including adenomatoid tumor, fibroma, leiomyoma, mesothelioma, or sarcoma. Urology consultation is recommended  4/26 TEE: LVEF 60-65%, Grade 1 DD, trivial pericardial effusion 4/27 US testes with masses 5/02 renal US: no hydronephrosis, Possible nonobstructing stone lower pole left kidney  SIGNIFICANT EVENTS: 4/22 - 5/14  Admit for R rotator cuff repair, complicated course with prolonged resp fx, renal fx, DVT, testicular masses 5/17 re-do tracheostomy, HCAP 5/19 UFH gtt resumed 5/20 tolerating ATC. No bleeding with UFH gtt.   SUBJECTIVE:   Comfortable on ATC. Audible rhonchi. RASS -1. + F/C   VITAL SIGNS: Temp:  [99.2 F (37.3 C)-101.7 F (38.7 C)] 99.2 F (37.3 C) (05/20 1128) Pulse Rate:  [83-96] 87 (05/20 1128) Resp:  [21-29] 21 (05/20 1128) BP: (125-157)/(59-91) 134/73 mmHg (05/20 1000) SpO2:  [93 %-97 %] 94 % (05/20 1000) FiO2 (%):  [28 %] 28 % (05/20 1300) Weight:  [148 kg (326 lb 4.5 oz)] 148 kg (326 lb 4.5 oz) (05/20 0500)   HEMODYNAMICS:     VENTILATOR SETTINGS: Vent Mode:  [-]  FiO2 (%):  [28 %] 28 %   INTAKE / OUTPUT:  Intake/Output Summary (Last 24  hours) at 01/25/15 1345 Last data filed at 01/25/15 1200  Gross per 24 hour  Intake 1699.67 ml  Output   3975 ml  Net -2275.33 ml    PHYSICAL EXAMINATION:  Gen: RASS -1, NAD HENT: NCAT, trach site clean  PULM: rhonchi CV: RRR, no M GI: obese, BS+, soft, nontender MSK: R shoulder without redness, swelling, sling in place Derm: mild redness near PICC site, no drainage Neuro: no focal deficits  LABS:  CBC  Recent Labs Lab 01/23/15 0430 01/24/15 0342 01/25/15 0248  WBC 14.1* 11.8* 11.6*  HGB 10.5* 10.2* 10.8*  HCT 33.7* 33.5* 35.5*  PLT 149* 138* 138*   Coag's No results for input(s): APTT, INR in the last 168 hours.   BMET  Recent Labs Lab 01/23/15 0430 01/24/15 0342 01/25/15 0248  NA 142 146* 148*  K 4.0 3.7 3.7  CL 103 112* 114*  CO2 26 22 24   BUN 107* 129* 128*  CREATININE 3.40* 2.71* 2.16*  GLUCOSE 155* 130* 156*   Electrolytes  Recent Labs Lab 01/22/15 1015 01/22/15 2200 01/23/15 0430 01/23/15 1028 01/23/15 2220 01/24/15 0342 01/25/15 0248  CALCIUM 8.9  --  8.6*  --   --  8.0* 9.0  MG 3.0* 2.3  --  2.4 2.3  --   --   PHOS 11.7*  --   --   --   --  6.6* 5.6*   Sepsis Markers  Recent Labs Lab 01/22/15 1015 01/23/15 0430 01/24/15 0342  PROCALCITON 3.32 2.54 1.23     ABG  Recent Labs Lab 01/21/15 0401  PHART 7.413  PCO2ART 37.6  PO2ART 89.0   Liver Enzymes  Recent Labs Lab 01/22/15 1015 01/24/15 0342 01/25/15 0248  AST  --  57*  --   ALT  --  36  --   ALKPHOS  --  91  --   BILITOT  --  1.4*  --   ALBUMIN 2.7* 2.5* 2.5*   Cardiac Enzymes No results for input(s): TROPONINI, PROBNP in the last 168 hours.   Glucose  Recent Labs Lab 01/24/15 1640 01/24/15 2054 01/25/15 0116 01/25/15 0413 01/25/15 0738 01/25/15 1131  GLUCAP 146* 138* 149* 136* 159* 154*    CXR: NNF    ASSESSMENT / PLAN:  PULMONARY A: Chronic Hypoxic / Hypercapneic Respiratory Failure Presumed OSA / OHS Tracheostomy tube dependence S/p  redo tracheostomy 5/17 R Pneumothorax, resolved Smoker  Possible HCAP,  5/17 P:   Cont PRN vent - settings reviewed Cont PSV > ATC as long as tolerated  CARDIOVASCULAR A:  Hx of HTN, chronic dCHF, HLD Extensive BLE DVT s/p IVC Filter placement  P:  Cont UFH gtt per pharmacy   If tolerates through WE without bleeding, can transition to warfarin Continue prn hydralazine  RENAL Tunneled HD Cath 5/13 >>  A:  AKI, recovering Scrotal mass - AFP and beta HCG normal P:  Monitor BMET intermittently Monitor I/Os, Uo Correct electrolytes as indicated When recovers sufficiently from [place] illness, consider Urology eval for scrotal mass  GASTROINTESTINAL A:   High GRVs, resolved Constipation, resolved P:  SUP: enteral famotidine Continue TFs @ curretn rate Cont EES as motility agent (started 5/09)  HEMATOLOGIC A:   Polycythemia - likely chronic hypoxemia. Resolved Anemia - ICU acquired + acute blood loss with tracheostomy bleeding  No further acute blood loss P:  DVT px: full dose UFH  Monitor CBC intermittently Transfuse per usual ICU guidelines  INFECTIOUS A:  Fever 5/16, resolved Suspected HCAP, 5/16 1/22 blood GPC > contaminant P:    5/16 blood cx >> 1/2 coag neg staph 5/16 resp cx >>NOS >>   5/17 vanc >> 5/20 5/17 cefepime >>   Cont abx 7/10 days Following PCT  ENDOCRINE A:  Hyperglycemia without prior documentation of DM Elevated Hgb A1C (7.5 on 4/22) P:  Cont sensitive SSI   NEUROLOGIC A:  Acute encephalopathy, resolved Post op pain Severe deconditioning P:  RASS goal 0  Cont PRN fent, PRN lorazepam PT eval and rx  ORTHO A: Rt shoulder surgery 4/22 P: Post op mgmt per Ortho  Continue ROM of RUL  If no further complications over WE, [facility]  Billy Fischeravid Keilan Nichol, MD ; [facility] Mobile [phone].  After 5:30 PM or weekends, call [phone]

## 2015-01-25 NOTE — Progress Notes (Signed)
ANTICOAGULATION CONSULT NOTE - Follow Up Consult  Pharmacy Consult for heparin Indication: DVT  No Known Allergies  Patient Measurements: Height: 5\' 11"  (180.3 cm) Weight: (!) 326 lb 4.5 oz (148 kg) IBW/kg (Calculated) : 75.3 Heparin Dosing Weight: 109 kg  Vital Signs: Temp: 99.5 F (37.5 C) (05/20 1554) Temp Source: Core (Comment) (05/20 1554) BP: 138/77 mmHg (05/20 1554) Pulse Rate: 84 (05/20 1554)  Labs:  Recent Labs  01/23/15 0430 01/24/15 0342 01/24/15 1923 01/25/15 0248  HGB 10.5* 10.2*  --  10.8*  HCT 33.7* 33.5*  --  35.5*  PLT 149* 138*  --  138*  HEPARINUNFRC  --   --  0.18* 0.41  CREATININE 3.40* 2.71*  --  2.16*    Estimated Creatinine Clearance: 53 mL/min (by C-G formula based on Cr of 2.16).   Medications:  Scheduled:  . antiseptic oral rinse  7 mL Mouth Rinse q12n4p  . ceFEPime (MAXIPIME) IV  1 g Intravenous Q24H  . chlorhexidine  15 mL Mouth Rinse BID  . erythromycin  400 mg Per Tube 3 times per day  . famotidine  20 mg Per Tube Daily  . feeding supplement (NEPRO CARB STEADY)  1,000 mL Per Tube Q24H  . feeding supplement (PRO-STAT SUGAR FREE 64)  60 mL Per Tube 5 X Daily  . folic acid  1 mg Oral Daily  . heparin  40 Units/kg Dialysis Once in dialysis  . insulin aspart  0-9 Units Subcutaneous 6 times per day  . lanthanum  1,000 mg Oral TID WC   Infusions:  . heparin 1,800 Units/hr (01/24/15 2010)    Assessment: 64 yo male with DVT is currently on supratherapeutic heparin.  Heparin level is 0.73. Goal of Therapy:  Heparin level 0.3-0.7 units/ml Monitor platelets by anticoagulation protocol: Yes   Plan:                           - reduce heparin to 1650 units/hr - 6 hr heparin level   Alani Sabbagh, Tsz-Yin 01/25/2015,4:32 PM

## 2015-01-26 LAB — GLUCOSE, CAPILLARY
GLUCOSE-CAPILLARY: 141 mg/dL — AB (ref 65–99)
GLUCOSE-CAPILLARY: 120 mg/dL — AB (ref 65–99)
GLUCOSE-CAPILLARY: 136 mg/dL — AB (ref 65–99)
Glucose-Capillary: 131 mg/dL — ABNORMAL HIGH (ref 65–99)
Glucose-Capillary: 157 mg/dL — ABNORMAL HIGH (ref 65–99)
Glucose-Capillary: 149 mg/dL — ABNORMAL HIGH (ref 65–99)

## 2015-01-26 LAB — CBC
HCT: 35.6 % — ABNORMAL LOW (ref 39.0–52.0)
HEMOGLOBIN: 10.8 g/dL — AB (ref 13.0–17.0)
MCH: 30.9 pg (ref 26.0–34.0)
MCHC: 30.3 g/dL (ref 30.0–36.0)
MCV: 101.7 fL — ABNORMAL HIGH (ref 78.0–100.0)
Platelets: 132 10*3/uL — ABNORMAL LOW (ref 150–400)
RBC: 3.5 MIL/uL — ABNORMAL LOW (ref 4.22–5.81)
RDW: 16.5 % — AB (ref 11.5–15.5)
WBC: 12.6 10*3/uL — AB (ref 4.0–10.5)

## 2015-01-26 LAB — HEPARIN LEVEL (UNFRACTIONATED)
HEPARIN UNFRACTIONATED: 0.46 [IU]/mL (ref 0.30–0.70)
HEPARIN UNFRACTIONATED: 0.74 [IU]/mL — AB (ref 0.30–0.70)
HEPARIN UNFRACTIONATED: 0.85 [IU]/mL — AB (ref 0.30–0.70)
Heparin Unfractionated: 0.5 IU/mL (ref 0.30–0.70)

## 2015-01-26 LAB — RENAL FUNCTION PANEL
ANION GAP: 13 (ref 5–15)
Albumin: 2.5 g/dL — ABNORMAL LOW (ref 3.5–5.0)
BUN: 128 mg/dL — ABNORMAL HIGH (ref 6–20)
CALCIUM: 8.8 mg/dL — AB (ref 8.9–10.3)
CO2: 24 mmol/L (ref 22–32)
CREATININE: 1.75 mg/dL — AB (ref 0.61–1.24)
Chloride: 115 mmol/L — ABNORMAL HIGH (ref 101–111)
GFR, EST AFRICAN AMERICAN: 47 mL/min — AB (ref >60)
GFR, EST NON AFRICAN AMERICAN: 40 mL/min — AB (ref >60)
Glucose, Bld: 151 mg/dL — ABNORMAL HIGH (ref 65–99)
Phosphorus: 5.5 mg/dL — ABNORMAL HIGH (ref 2.5–4.6)
Potassium: 3.7 mmol/L (ref 3.5–5.1)
Sodium: 152 mmol/L — ABNORMAL HIGH (ref 135–145)

## 2015-01-26 MED ORDER — HEPARIN (PORCINE) IN NACL 100-0.45 UNIT/ML-% IJ SOLN
1350.0000 [IU]/h | INTRAMUSCULAR | Status: DC
  Administered 2015-01-26 – 2015-01-27 (×3): 1350 [IU]/h via INTRAVENOUS
  Filled 2015-01-26 (×5): qty 250

## 2015-01-26 MED ORDER — FREE WATER
200.0000 mL | Status: DC
  Administered 2015-01-26 – 2015-01-28 (×14): 200 mL

## 2015-01-26 MED ORDER — CETYLPYRIDINIUM CHLORIDE 0.05 % MT LIQD
7.0000 mL | Freq: Two times a day (BID) | OROMUCOSAL | Status: DC
  Administered 2015-01-26 – 2015-01-29 (×6): 7 mL via OROMUCOSAL

## 2015-01-26 MED ORDER — CEFEPIME HCL 1 G IJ SOLR
1.0000 g | Freq: Two times a day (BID) | INTRAMUSCULAR | Status: DC
  Administered 2015-01-26 – 2015-01-28 (×4): 1 g via INTRAVENOUS
  Filled 2015-01-26 (×6): qty 1

## 2015-01-26 NOTE — Progress Notes (Signed)
eLink Physician-Brief Progress Note Patient Name: [name] DOB: 12/18/1950 MRN: [id number]   Date of Service  01/26/2015  HPI/Events of Note  Na+ = 152.  eICU Interventions  Will order free water 200 mL via enteral tube Q 4 hours.      Intervention Category Intermediate Interventions: Electrolyte abnormality - evaluation and management  [name] 01/26/2015, 4:56 AM

## 2015-01-26 NOTE — Progress Notes (Addendum)
ANTICOAGULATION CONSULT NOTE - Follow Up Consult  Pharmacy Consult for heparin Indication: DVT  No Known Allergies  Patient Measurements: Height: 5\' 11"  (180.3 cm) Weight: (!) 326 lb 4.5 oz (148 kg) IBW/kg (Calculated) : 75.3 Heparin Dosing Weight: 109 kg  Vital Signs: Temp: 98.7 F (37.1 C) (05/20 2300) Temp Source: Core (Comment) (05/20 2200) BP: 132/82 mmHg (05/20 2300) Pulse Rate: 78 (05/20 2300)  Labs:  Recent Labs  01/23/15 0430 01/24/15 0342  01/25/15 0248 01/25/15 1545 01/25/15 2332  HGB 10.5* 10.2*  --  10.8*  --   --   HCT 33.7* 33.5*  --  35.5*  --   --   PLT 149* 138*  --  138*  --   --   HEPARINUNFRC  --   --   < > 0.41 0.73* 0.85*  CREATININE 3.40* 2.71*  --  2.16*  --   --   < > = values in this interval not displayed.  Estimated Creatinine Clearance: 53 mL/min (by C-G formula based on Cr of 2.16).   Medications:  Scheduled:  . antiseptic oral rinse  7 mL Mouth Rinse q12n4p  . ceFEPime (MAXIPIME) IV  1 g Intravenous Q24H  . chlorhexidine  15 mL Mouth Rinse BID  . erythromycin  400 mg Per Tube 3 times per day  . famotidine  20 mg Per Tube Daily  . feeding supplement (NEPRO CARB STEADY)  1,000 mL Per Tube Q24H  . feeding supplement (PRO-STAT SUGAR FREE 64)  60 mL Per Tube 5 X Daily  . folic acid  1 mg Oral Daily  . heparin  40 Units/kg Dialysis Once in dialysis  . insulin aspart  0-9 Units Subcutaneous 6 times per day  . lanthanum  1,000 mg Oral TID WC   Infusions:  . heparin 1,650 Units/hr (01/25/15 1750)    Assessment: 64 yo male with DVT is currently on supratherapeutic heparin.  6 hour heparin level is 0.85, increased despite decreased heparin rate. No bleeding noted and no complications with heparin infusion per RN's report.   Goal of Therapy:  Heparin level 0.3-0.7 units/ml Monitor platelets by anticoagulation protocol: Yes   Plan:                           -Decrease heparin to 1450 units/hr - 6 hr heparin level  Noah Delaineuth Brennah Quraishi,  RPh Clinical Pharmacist Pager: [phone] 01/26/2015,12:18 AM

## 2015-01-26 NOTE — Progress Notes (Addendum)
ANTICOAGULATION CONSULT NOTE - Follow Up Consult  Pharmacy Consult for heparin Indication: DVT / HCAP  No Known Allergies  Patient Measurements: Height: 5\' 11"  (180.3 cm) Weight: (!) 315 lb 4.1 oz (143 kg) IBW/kg (Calculated) : 75.3 Heparin Dosing Weight: 109 kg  Vital Signs: Temp: 99.6 F (37.6 C) (05/21 1500) Temp Source: Oral (05/21 1116) BP: 172/72 mmHg (05/21 1500) Pulse Rate: 84 (05/21 1500)  Labs:  Recent Labs  01/24/15 0342  01/25/15 0248  01/25/15 2332 01/26/15 0232 01/26/15 0233 01/26/15 0715 01/26/15 1519  HGB 10.2*  --  10.8*  --   --   --  10.8*  --   --   HCT 33.5*  --  35.5*  --   --   --  35.6*  --   --   PLT 138*  --  138*  --   --   --  132*  --   --   HEPARINUNFRC  --   < > 0.41  < > 0.85*  --   --  0.74* 0.50  CREATININE 2.71*  --  2.16*  --   --  1.75*  --   --   --   < > = values in this interval not displayed.  Estimated Creatinine Clearance: 64.2 mL/min (by C-G formula based on Cr of 1.75).  Assessment: 64 yo male with DVT on IV heparin. Level this evening is therapeutic this afternoon at 0.5 units/mL after decrease this morning. No bleeding noted. CBC low but stable.  Goal of Therapy:  Heparin level 0.3-0.7 units/ml Monitor platelets by anticoagulation protocol: Yes  Eradication of infection   Plan:                           -continue heparin at 1350 units/hr -confirmatory HL at 2100 tonight  -Daily HL/CBC -F/u long-term anticoag plan -Mon s/sx bleed  Lauren D. Bajbus, PharmD, BCPS Clinical Pharmacist Pager: [phone] 01/26/2015 4:24 PM  Addendum:  Recheck heparin level = 0.46, therapeutic  Plan:  continue Golinski rate F/u AM labs.

## 2015-01-26 NOTE — Progress Notes (Addendum)
ANTICOAGULATION/ANTIBIOTIC CONSULT NOTE - Follow Up Consult  Pharmacy Consult for heparin / cefepime Indication: DVT / HCAP  No Known Allergies  Patient Measurements: Height: 5\' 11"  (180.3 cm) Weight: (!) 326 lb 4.5 oz (148 kg) IBW/kg (Calculated) : 75.3 Heparin Dosing Weight: 109 kg  Vital Signs: Temp: 98.7 F (37.1 C) (05/21 0735) Temp Source: Oral (05/21 0735) BP: 143/70 mmHg (05/21 0735) Pulse Rate: 72 (05/21 0735)  Labs:  Recent Labs  01/24/15 0342  01/25/15 0248 01/25/15 1545 01/25/15 2332 01/26/15 0232 01/26/15 0233 01/26/15 0715  HGB 10.2*  --  10.8*  --   --   --  10.8*  --   HCT 33.5*  --  35.5*  --   --   --  35.6*  --   PLT 138*  --  138*  --   --   --  132*  --   HEPARINUNFRC  --   < > 0.41 0.73* 0.85*  --   --  0.74*  CREATININE 2.71*  --  2.16*  --   --  1.75*  --   --   < > = values in this interval not displayed.  Estimated Creatinine Clearance: 65.5 mL/min (by C-G formula based on Cr of 1.75).   Medications:  Scheduled:  . antiseptic oral rinse  7 mL Mouth Rinse q12n4p  . ceFEPime (MAXIPIME) IV  1 g Intravenous Q24H  . chlorhexidine  15 mL Mouth Rinse BID  . erythromycin  400 mg Per Tube 3 times per day  . famotidine  20 mg Per Tube Daily  . feeding supplement (NEPRO CARB STEADY)  1,000 mL Per Tube Q24H  . feeding supplement (PRO-STAT SUGAR FREE 64)  60 mL Per Tube 5 X Daily  . folic acid  1 mg Oral Daily  . free water  200 mL Per Tube 6 times per day  . heparin  40 Units/kg Dialysis Once in dialysis  . insulin aspart  0-9 Units Subcutaneous 6 times per day  . lanthanum  1,000 mg Oral TID WC   Infusions:  . heparin 1,450 Units/hr (01/26/15 0030)    Assessment: 64 yo male with DVT is currently on supratherapeutic heparin. 6 hour heparin level is 0.74, increased despite several decreases in heparin rate. No bleeding noted and no complications with heparin infusion per RN's report.  Pharmacy continuing to dose cefepime for HCAP. AKI due to  shock slowly improving. SCr 2.16>>1.75 today, CrCL~64, UOP 1.1. Last HD 5/17 and likely no further HD needed per renal. Wbc slight incr 12.6, PCT trend down to 1.23  Vanc 5/17 >> 5/20  Cefepime 5/17 >>  5/16 BCx x2 - CoNS in 1/2 - likely contaminant  5/16 TA - C albicans  5/19 Wound cx -   Goal of Therapy:  Heparin level 0.3-0.7 units/ml Monitor platelets by anticoagulation protocol: Yes  Eradication of infection   Plan:                           -Decrease heparin to 1350 units/hr -6h HL -Daily HL/CBC -F/u long-term anticoag plan -Mon s/sx bleed  -Cefepime to 1 gm IV q12h for improving renal function -Monitor renal function, clinical progress, abx plan    Babs BertinHaley Andersen Mckiver, PharmD Clinical Pharmacist - Resident Pager [phone] 01/26/2015 9:07 AM

## 2015-01-26 NOTE — Progress Notes (Signed)
Subjective: Interval History: has no complaint, mouthing words, difficult to interpret, but coop..  Objective: Vital signs in last 24 hours: Temp:  [98.2 F (36.8 C)-99.5 F (37.5 C)] 98.7 F (37.1 C) (05/21 0735) Pulse Rate:  [72-87] 72 (05/21 0735) Resp:  [14-22] 18 (05/21 0735) BP: (131-162)/(58-82) 143/70 mmHg (05/21 0735) SpO2:  [94 %-100 %] 98 % (05/21 0735) FiO2 (%):  [28 %] 28 % (05/21 0733) Weight change:   Intake/Output from previous day: 05/20 0701 - 05/21 0700 In: 1484.7 [I.V.:504.7; NG/GT:980] Out: 3975 [Urine:3975] Intake/Output this shift: Total I/O In: -  Out: 450 [Urine:450]  General appearance: alert, cooperative and morbidly obese Neck: trach, on collar Resp: diminished breath sounds bilaterally and rhonchi bilaterally Cardio: S1, S2 normal and systolic murmur: holosystolic 2/6, blowing at apex GI: massive, soft, pos bs Extremities: 3+ edema, brace R shoulder  Lab Results:  Recent Labs  01/25/15 0248 01/26/15 0233  WBC 11.6* 12.6*  HGB 10.8* 10.8*  HCT 35.5* 35.6*  PLT 138* 132*   BMET:  Recent Labs  01/25/15 0248 01/26/15 0232  NA 148* 152*  K 3.7 3.7  CL 114* 115*  CO2 24 24  GLUCOSE 156* 151*  BUN 128* 128*  CREATININE 2.16* 1.75*  CALCIUM 9.0 8.8*   No results for input(s): PTH in the last 72 hours. Iron Studies: No results for input(s): IRON, TIBC, TRANSFERRIN, FERRITIN in the last 72 hours.  Studies/Results: No results found.  I have reviewed the patient's Mayorquin medications.  Assessment/Plan: 1 AKI improving. Diuresing . Acid/base/K ok.  ^ SNa with hyponatremic diuresis of AKI recovery.  Free water started. Will see how that does for 24 h.  Will get PC out Mon if cont 2 Anemia stable 3 ^ SNa free water per tube 4 Fever ? S epi. 5 OHS 6 Resp failure per Pulm 7 Massive obesity 8 Shoulder surgery 9 Trach 10 Testic mass eval when stable P Free water, get PC out soon.  AB, TF    LOS: 6 days   Jacqueline Spofford,Maris  L 01/26/2015,8:14 AM

## 2015-01-26 NOTE — Progress Notes (Signed)
PULMONARY / CRITICAL CARE MEDICINE   Name: [name] MRN: [id number] DOB: April 07, 1951    ADMISSION DATE:  01/20/2015  REFERRING MD :  [facility]   CHIEF COMPLAINT:  Tracheostomy Dislodgement  INITIAL PRESENTATION: [name] initially admitted 4/22-5/14 for R rotator cuff repair by Ortho.  Failed extubation post-op and reintubated 4/23.  Post operative course complicated by prolonged respiratory respiratory failure, findings of testicular masses, acute renal failure requiring CVVHD, BLE DVT, and tracheostomy bleeding s/p revision.  He was transferred to [facility] 5/14 PM and tracheostomy became dislodged, re-admitted to [facility] ICU 5/15 early am.    STUDIES:  4/23 US scrotum: Two solid extratesticular right scrotal masses. Primary differential diagnosis for extratesticular masses include primary or metastatic neoplasms including adenomatoid tumor, fibroma, leiomyoma, mesothelioma, or sarcoma. Urology consultation is recommended  4/26 TEE: LVEF 60-65%, Grade 1 DD, trivial pericardial effusion 4/27 [place] testes with masses 5/02 renal US: no hydronephrosis, Possible nonobstructing stone lower pole left kidney  SIGNIFICANT EVENTS: 4/22 - 5/14  Admit for R rotator cuff repair, complicated course with prolonged resp fx, renal fx, DVT, testicular masses 5/17 re-do tracheostomy, HCAP 5/19 UFH gtt resumed 5/20 tolerating ATC. No bleeding with UFH gtt.   SUBJECTIVE:   Comfortable on ATC. Audible rhonchi. RASS -1. + F/C   VITAL SIGNS: Temp:  [98.2 F (36.8 C)-99.5 F (37.5 C)] 99.2 F (37.3 C) (05/21 1116) Pulse Rate:  [72-84] 80 (05/21 1116) Resp:  [14-21] 18 (05/21 1116) BP: (131-162)/(58-82) 146/75 mmHg (05/21 1116) SpO2:  [96 %-100 %] 99 % (05/21 1116) FiO2 (%):  [28 %] 28 % (05/21 0733) Weight:  [143 kg (315 lb 4.1 oz)] 143 kg (315 lb 4.1 oz) (05/21 0900)   HEMODYNAMICS:     VENTILATOR SETTINGS: Vent Mode:  [-]  FiO2 (%):  [28 %] 28 %   INTAKE / OUTPUT:  Intake/Output Summary (Last 24  hours) at 01/26/15 1131 Last data filed at 01/26/15 1122  Gross per 24 hour  Intake 1212.73 ml  Output   4425 ml  Net -3212.27 ml    PHYSICAL EXAMINATION:  Gen:  NAD, obese, watching TV HENT: NCAT, trach site small blood, no active bleed PULM: few rhonchi, unlabored, trach collar, no stridor CV: RRR, no M GI: obese, BS+, soft, nontender MSK: R shoulder without redness, swelling, sling in place Derm: mild redness near PICC site, no drainage Neuro: no focal deficits. He was slow to attend to me, with little interaction  LABS:  CBC  Recent Labs Lab 01/24/15 0342 01/25/15 0248 01/26/15 0233  WBC 11.8* 11.6* 12.6*  HGB 10.2* 10.8* 10.8*  HCT 33.5* 35.5* 35.6*  PLT 138* 138* 132*   Coag's No results for input(s): APTT, INR in the last 168 hours.   BMET  Recent Labs Lab 01/24/15 0342 01/25/15 0248 01/26/15 0232  NA 146* 148* 152*  K 3.7 3.7 3.7  CL 112* 114* 115*  CO2 BUN 129* 128* 128*  CREATININE 2.71* 2.16* 1.75*  GLUCOSE 130* 156* 151*   Electrolytes  Recent Labs Lab 01/22/15 2200  01/23/15 1028 01/23/15 2220 01/24/15 0342 01/25/15 0248 01/26/15 0232  CALCIUM  --   < >  --   --  8.0* 9.0 8.8*  MG 2.3  --  2.4 2.3  --   --   --   PHOS  --   --   --   --  6.6* 5.6* 5.5*  < > = values in this interval not displayed.  Sepsis Markers  Recent Labs Lab 01/22/15 1015 01/23/15 0430 01/24/15 0342  PROCALCITON 3.32 2.54 1.23     ABG  Recent Labs Lab 01/21/15 0401  PHART 7.413  PCO2ART 37.6  PO2ART 89.0   Liver Enzymes  Recent Labs Lab 01/24/15 0342 01/25/15 0248 01/26/15 0232  AST 57*  --   --   ALT 36  --   --   ALKPHOS 91  --   --   BILITOT 1.4*  --   --   ALBUMIN 2.5* 2.5* 2.5*   Cardiac Enzymes No results for input(s): TROPONINI, PROBNP in the last 168 hours.   Glucose  Recent Labs Lab 01/25/15 1131 01/25/15 1551 01/25/15 2043 01/25/15 2326 01/26/15 0430 01/26/15 0738  GLUCAP 154* 130* 130* 131* 157* 141*     CXR: NNF I reviewed latest film- portable   ASSESSMENT / PLAN:  PULMONARY A: Chronic Hypoxic / Hypercapneic Respiratory Failure Presumed OSA / OHS Tracheostomy tube dependence S/p redo tracheostomy 5/17. Small blood at dressing, but looks stable R Pneumothorax, resolved Smoker  Possible HCAP,  5/17 P:   Cont PRN vent - settings reviewed Cont PSV > ATC as long as tolerated  CARDIOVASCULAR A:  Hx of HTN, chronic dCHF, HLD Extensive BLE DVT s/p IVC Filter placement  P:  Cont UFH gtt per pharmacy   If tolerates through WE without bleeding, can transition to warfarin Continue prn hydralazine  RENAL Tunneled HD Cath 5/13 >>  A:  AKI, recovering Scrotal mass - AFP and beta HCG normal P:  Monitor BMET intermittently Monitor I/Os, Uo Correct electrolytes as indicated When recovers sufficiently from [place] illness, consider Urology eval for scrotal mass  GASTROINTESTINAL A:   High GRVs, resolved Constipation, resolved P:  SUP: enteral famotidine Continue TFs @ curretn rate Cont EES as motility agent (started 5/09)  HEMATOLOGIC A:   Polycythemia - likely chronic hypoxemia. Resolved Anemia - ICU acquired + acute blood loss with tracheostomy bleeding  No further acute blood loss P:  DVT px: full dose UFH  Monitor CBC intermittently Transfuse per usual ICU guidelines  INFECTIOUS A:  Fever 5/16, resolved Suspected HCAP, 5/16 1/22 blood GPC > contaminant P:    5/16 blood cx >> 1/2 coag neg staph 5/16 resp cx >>NOS >>   5/17 vanc >> 5/20 5/17 cefepime >>   Cont abx 7/10 days Following PCT  ENDOCRINE A:  Hyperglycemia without prior documentation of DM Elevated Hgb A1C (7.5 on 4/22) P:  Cont sensitive SSI   NEUROLOGIC A:  Acute encephalopathy, resolved Post op pain Severe deconditioning P:  RASS goal 0  Cont PRN fent, PRN lorazepam PT eval and rx  ORTHO A: Rt shoulder surgery 4/22 P: Post op mgmt per Ortho  Continue ROM of  RUL  If no further complications over WE, [facility]  CD Young, MD ; PCCM service p [phone].  After 3:00 PM , call [phone]

## 2015-01-27 LAB — GLUCOSE, CAPILLARY
GLUCOSE-CAPILLARY: 126 mg/dL — AB (ref 65–99)
GLUCOSE-CAPILLARY: 131 mg/dL — AB (ref 65–99)
GLUCOSE-CAPILLARY: 150 mg/dL — AB (ref 65–99)
Glucose-Capillary: 135 mg/dL — ABNORMAL HIGH (ref 65–99)
Glucose-Capillary: 138 mg/dL — ABNORMAL HIGH (ref 65–99)
Glucose-Capillary: 139 mg/dL — ABNORMAL HIGH (ref 65–99)

## 2015-01-27 LAB — CBC
HEMATOCRIT: 35.3 % — AB (ref 39.0–52.0)
Hemoglobin: 10.7 g/dL — ABNORMAL LOW (ref 13.0–17.0)
MCH: 30.2 pg (ref 26.0–34.0)
MCHC: 30.3 g/dL (ref 30.0–36.0)
MCV: 99.7 fL (ref 78.0–100.0)
PLATELETS: 152 10*3/uL (ref 150–400)
RBC: 3.54 MIL/uL — ABNORMAL LOW (ref 4.22–5.81)
RDW: 16.3 % — ABNORMAL HIGH (ref 11.5–15.5)
WBC: 11.9 10*3/uL — ABNORMAL HIGH (ref 4.0–10.5)

## 2015-01-27 LAB — RENAL FUNCTION PANEL
ALBUMIN: 2.7 g/dL — AB (ref 3.5–5.0)
ANION GAP: 11 (ref 5–15)
BUN: 118 mg/dL — ABNORMAL HIGH (ref 6–20)
CO2: 26 mmol/L (ref 22–32)
Calcium: 9.3 mg/dL (ref 8.9–10.3)
Chloride: 115 mmol/L — ABNORMAL HIGH (ref 101–111)
Creatinine, Ser: 1.6 mg/dL — ABNORMAL HIGH (ref 0.61–1.24)
GFR calc Af Amer: 52 mL/min — ABNORMAL LOW (ref >60)
GFR, EST NON AFRICAN AMERICAN: 45 mL/min — AB (ref >60)
GLUCOSE: 144 mg/dL — AB (ref 65–99)
Phosphorus: 3.5 mg/dL (ref 2.5–4.6)
Potassium: 3.3 mmol/L — ABNORMAL LOW (ref 3.5–5.1)
Sodium: 152 mmol/L — ABNORMAL HIGH (ref 135–145)

## 2015-01-27 LAB — WOUND CULTURE

## 2015-01-27 LAB — CULTURE, BLOOD (ROUTINE X 2): CULTURE: NO GROWTH

## 2015-01-27 LAB — HEPARIN LEVEL (UNFRACTIONATED): Heparin Unfractionated: 0.44 IU/mL (ref 0.30–0.70)

## 2015-01-27 MED ORDER — SODIUM CHLORIDE 0.45 % IV SOLN
INTRAVENOUS | Status: DC
Start: 2015-01-27 — End: 2015-01-28
  Administered 2015-01-27: 1000 mL via INTRAVENOUS
  Administered 2015-01-27: 50 mL/h via INTRAVENOUS

## 2015-01-27 MED ORDER — POTASSIUM CHLORIDE 20 MEQ/15ML (10%) PO SOLN
40.0000 meq | Freq: Once | ORAL | Status: AC
  Administered 2015-01-27: 40 meq via ORAL
  Filled 2015-01-27: qty 30

## 2015-01-27 NOTE — Progress Notes (Signed)
RT Note:  Vent on standby at this time.  Rt will continue to monitor.

## 2015-01-27 NOTE — Progress Notes (Signed)
ANTICOAGULATION CONSULT NOTE - Follow Up Consult  Pharmacy Consult for heparin Indication: DVT   No Known Allergies  Patient Measurements: Height: 5\' 11"  (180.3 cm) Weight: (!) 315 lb 4.1 oz (143 kg) IBW/kg (Calculated) : 75.3 Heparin Dosing Weight: 109 kg  Vital Signs: Temp: 99.5 F (37.5 C) (05/22 0724) Temp Source: Core (Comment) (05/22 0724) BP: 166/43 mmHg (05/22 0724) Pulse Rate: 85 (05/22 0724)  Labs:  Recent Labs  01/25/15 0248  01/26/15 0232 01/26/15 0233  01/26/15 1519 01/26/15 2046 01/27/15 0322  HGB 10.8*  --   --  10.8*  --   --   --  10.7*  HCT 35.5*  --   --  35.6*  --   --   --  35.3*  PLT 138*  --   --  132*  --   --   --  152  HEPARINUNFRC 0.41  < >  --   --   < > 0.50 0.46 0.44  CREATININE 2.16*  --  1.75*  --   --   --   --  1.60*  < > = values in this interval not displayed.  Estimated Creatinine Clearance: 70.2 mL/min (by C-G formula based on Cr of 1.6).  Assessment: 64 yo male with recent bilat DVT s/p IVC filter 01/09/15 continuing on IV heparin. HL therapeutic x3 (0.44) @1350  units/hr. No bleeding noted. CBC low but stable.  Goal of Therapy:  Heparin level 0.3-0.7 units/ml Monitor platelets by anticoagulation protocol: Yes    Plan:                           Continue Heparin @1350  units/hr  Daily HL & CBC  Mon s/sx bleed  MD note - warfarin transition if tolerates heparin through weekend  Babs BertinHaley Kamali Sakata, PharmD Clinical Pharmacist - Resident Pager [phone] 01/27/2015 8:14 AM

## 2015-01-27 NOTE — Progress Notes (Signed)
Subjective: Interval History: has no complaint , not conversant or coop.  Objective: Vital signs in last 24 hours: Temp:  [98.1 F (36.7 C)-99.6 F (37.6 C)] 99.4 F (37.4 C) (05/22 0400) Pulse Rate:  [67-90] 83 (05/22 0437) Resp:  [13-22] 20 (05/22 0437) BP: (142-186)/(53-86) 180/75 mmHg (05/22 0437) SpO2:  [93 %-100 %] 97 % (05/22 0437) FiO2 (%):  [28 %] 28 % (05/22 0437) Weight:  [143 kg (315 lb 4.1 oz)] 143 kg (315 lb 4.1 oz) (05/21 0900) Weight change:   Intake/Output from previous day: 05/21 0701 - 05/22 0700 In: 2667.5 [I.V.:167.5; NG/GT:2450; IV Piggyback:50] Out: 4075 [Urine:4075] Intake/Output this shift:    General appearance: morbidly obese, pale, slowed mentation and not responding but awake Resp: diminished breath sounds bilaterally and rhonchi bibasilar Chest wall: R IJ cath Cardio: S1, S2 normal and systolic murmur: holosystolic 2/6, blowing at apex GI: pos bs,massive, soft Extrem  2+ presacral edema Lab Results:  Recent Labs  01/26/15 0233 01/27/15 0322  WBC 12.6* 11.9*  HGB 10.8* 10.7*  HCT 35.6* 35.3*  PLT 132* 152   BMET:  Recent Labs  01/26/15 0232 01/27/15 0322  NA 152* 152*  K 3.7 3.3*  CL 115* 115*  CO2 24 26  GLUCOSE 151* 144*  BUN 128* 118*  CREATININE 1.75* 1.60*  CALCIUM 8.8* 9.3   No results for input(s): PTH in the last 72 hours. Iron Studies: No results for input(s): IRON, TIBC, TRANSFERRIN, FERRITIN in the last 72 hours.  Studies/Results: No results found.  I have reviewed the patient's [place] medications.  Assessment/Plan: 1 AKI cont to diurese.  Still vol xs. Slowly better,will not get to baseline, and may be close to new baseline.  .  Need to give K,.  And free water.as losing water in xs of Na with diuresis and cannot sense po.  BUN ^ with catabolic state. 2 high SNa. Use 1/2NS with no improvement with per tube.  3 Massive obesity 4 Infx  Pos bc S epi 5 Nutrition tf 6 OHS   7 Trach per Pulm 8 Shoulder  surgery 9 Tesitic masses P 1/2 NS.  , get IR to take out PC in am.,  Follow chem    LOS: 7 days   Janisa Labus,Haroun L 01/27/2015,7:18 AM

## 2015-01-27 NOTE — Progress Notes (Signed)
Vent in room on standby patient on 28% ATC tolerating well.

## 2015-01-27 NOTE — Progress Notes (Addendum)
PULMONARY / CRITICAL CARE MEDICINE   Name: [name] MRN: [id number] DOB: Dec 08, 1950    ADMISSION DATE:  01/20/2015  REFERRING MD :  [facility]   CHIEF COMPLAINT:  Tracheostomy Dislodgement  INITIAL PRESENTATION: [name] initially admitted 4/22-5/14 for R rotator cuff repair by Ortho.  Failed extubation post-op and reintubated 4/23.  Post operative course complicated by prolonged respiratory respiratory failure, findings of testicular masses, acute renal failure requiring CVVHD, BLE DVT, and tracheostomy bleeding s/p revision.  He was transferred to [facility] 5/14 PM and tracheostomy became dislodged, re-admitted to [facility] ICU 5/15 early am.    STUDIES:  4/23 US scrotum: Two solid extratesticular right scrotal masses. Primary differential diagnosis for extratesticular masses include primary or metastatic neoplasms including adenomatoid tumor, fibroma, leiomyoma, mesothelioma, or sarcoma. Urology consultation is recommended  4/26 TEE: LVEF 60-65%, Grade 1 DD, trivial pericardial effusion 4/27 [place] testes with masses 5/02 renal US: no hydronephrosis, Possible nonobstructing stone lower pole left kidney  SIGNIFICANT EVENTS: 4/22 - 5/14  Admit for R rotator cuff repair, complicated course with prolonged resp fx, renal fx, DVT, testicular masses 5/17 re-do tracheostomy, HCAP 5/19 UFH gtt resumed 5/20 tolerating ATC. No bleeding with UFH gtt.   SUBJECTIVE:    RASS -1. + F/C No evident discomfort   VITAL SIGNS: Temp:  [98.1 F (36.7 C)-99.6 F (37.6 C)] 99.5 F (37.5 C) (05/22 0724) Pulse Rate:  [79-90] 85 (05/22 0724) Resp:  [13-22] 18 (05/22 0724) BP: (142-186)/(43-75) 166/43 mmHg (05/22 0724) SpO2:  [95 %-100 %] 97 % (05/22 0817) FiO2 (%):  [28 %] 28 % (05/22 0817)   HEMODYNAMICS:     VENTILATOR SETTINGS: Vent Mode:  [-]  FiO2 (%):  [28 %] 28 %   INTAKE / OUTPUT:  Intake/Output Summary (Last 24 hours) at 01/27/15 1005 Last data filed at 01/27/15 0947  Gross per 24 hour  Intake  2389.45 ml  Output   4175 ml  Net -1785.55 ml    PHYSICAL EXAMINATION:  Gen:  NAD, obese, awake HENT: NCAT, trach site no blood, no active bleed PULM: few rhonchi, unlabored, trach collar 28%, no stridor CV: RRR, no M GI: obese, BS+ active, soft, nontender MSK: R shoulder without redness, swelling, sling in place Derm: mild redness near PICC site, no drainage Neuro: Protective mittens. Reaching left arm to ceiling, slow to fix gaze on me  LABS:  CBC  Recent Labs Lab 01/25/15 0248 01/26/15 0233 01/27/15 0322  WBC 11.6* 12.6* 11.9*  HGB 10.8* 10.8* 10.7*  HCT 35.5* 35.6* 35.3*  PLT 138* 132* 152   Coag's No results for input(s): APTT, INR in the last 168 hours.   BMET  Recent Labs Lab 01/25/15 0248 01/26/15 0232 01/27/15 0322  NA 148* 152* 152*  K 3.7 3.7 3.3*  CL 114* 115* 115*  CO2 BUN 128* 128* 118*  CREATININE 2.16* 1.75* 1.60*  GLUCOSE 156* 151* 144*   Electrolytes  Recent Labs Lab 01/22/15 2200  01/23/15 1028 01/23/15 2220  01/25/15 0248 01/26/15 0232 01/27/15 0322  CALCIUM  --   < >  --   --   < > 9.0 8.8* 9.3  MG 2.3  --  2.4 2.3  --   --   --   --   PHOS  --   --   --   --   < > 5.6* 5.5* 3.5  < > = values in this interval not displayed. Sepsis Markers  Recent Labs Lab  01/22/15 1015 01/23/15 0430 01/24/15 0342  PROCALCITON 3.32 2.54 1.23     ABG  Recent Labs Lab 01/21/15 0401  PHART 7.413  PCO2ART 37.6  PO2ART 89.0   Liver Enzymes  Recent Labs Lab 01/24/15 0342 01/25/15 0248 01/26/15 0232 01/27/15 0322  AST 57*  --   --   --   ALT 36  --   --   --   ALKPHOS 91  --   --   --   BILITOT 1.4*  --   --   --   ALBUMIN 2.5* 2.5* 2.5* 2.7*   Cardiac Enzymes No results for input(s): TROPONINI, PROBNP in the last 168 hours.   Glucose  Recent Labs Lab 01/26/15 1118 01/26/15 1709 01/26/15 2002 01/27/15 0030 01/27/15 0424 01/27/15 0728  GLUCAP 149* 120* 136* 135* 126* 138*    CXR: NNF I reviewed latest  film- portable   ASSESSMENT / PLAN:  PULMONARY A: Chronic Hypoxic / Hypercapneic Respiratory Failure Presumed OSA / OHS Tracheostomy tube dependence. Tolerating trach collar S/p redo tracheostomy 5/17.  looks stable with no blood now R Pneumothorax, resolved Smoker  Possible HCAP,  5/17 P:   Cont PRN vent - settings reviewed Cont PSV > ATC as long as tolerated  CARDIOVASCULAR A:  Hx of HTN, chronic dCHF, HLD Extensive BLE DVT s/p IVC Filter placement  P:  Cont UFH gtt per pharmacy   If tolerates through WE without bleeding, can transition to warfarin Continue prn hydralazine  RENAL Tunneled HD Cath 5/13 >>  A:  AKI, recovering Scrotal mass - AFP and beta HCG normal P:  Monitor BMET intermittently Monitor I/Os, Uo Correct electrolytes as indicated. K falling 3.3> given 40 meq this AM When recovers sufficiently from Jha illness, consider Urology eval for scrotal mass  GASTROINTESTINAL A:   High GRVs, resolved Constipation, resolved P:  SUP: enteral famotidine Continue TFs @ [place] rate Cont EES as motility agent (started 5/09)  HEMATOLOGIC A:   Polycythemia - likely chronic hypoxemia. Resolved Anemia - ICU acquired + acute blood loss with tracheostomy bleeding  No further acute blood loss P:  DVT px: full dose UFH  Monitor CBC intermittently Transfuse per usual ICU guidelines  INFECTIOUS A:  Fever 5/16, resolved Suspected HCAP, 5/16 1/22 blood GPC > contaminant P:    5/16 blood cx >> 1/2 coag neg staph 5/16 resp cx >>NOS >>   5/17 vanc >> 5/20 5/17 cefepime >>   Cont abx 7/10 days Following PCT  ENDOCRINE A:  Hyperglycemia without prior documentation of DM Elevated Hgb A1C (7.5 on 4/22) P:  Cont sensitive SSI   NEUROLOGIC A:  Acute encephalopathy, Post op pain Severe deconditioning P:  RASS goal 0  Cont PRN fent, PRN lorazepam PT eval and rx  ORTHO A: Rt shoulder surgery 4/22 P: Post op mgmt per Ortho   Continue ROM of RUL  If no further complications over WE, [facility]  CD Young, MD ; PCCM service p [phone].  After 3:00 PM , call [phone]

## 2015-01-28 ENCOUNTER — Inpatient Hospital Stay (HOSPITAL_COMMUNITY): Payer: Worker's Compensation

## 2015-01-28 DIAGNOSIS — R509 Fever, unspecified: Secondary | ICD-10-CM

## 2015-01-28 DIAGNOSIS — G9340 Encephalopathy, unspecified: Secondary | ICD-10-CM

## 2015-01-28 LAB — RENAL FUNCTION PANEL
ALBUMIN: 2.7 g/dL — AB (ref 3.5–5.0)
Anion gap: 10 (ref 5–15)
BUN: 99 mg/dL — AB (ref 6–20)
CALCIUM: 9.4 mg/dL (ref 8.9–10.3)
CHLORIDE: 120 mmol/L — AB (ref 101–111)
CO2: 24 mmol/L (ref 22–32)
Creatinine, Ser: 1.44 mg/dL — ABNORMAL HIGH (ref 0.61–1.24)
GFR calc Af Amer: 59 mL/min — ABNORMAL LOW (ref >60)
GFR calc non Af Amer: 51 mL/min — ABNORMAL LOW (ref >60)
GLUCOSE: 162 mg/dL — AB (ref 65–99)
POTASSIUM: 3.6 mmol/L (ref 3.5–5.1)
Phosphorus: 3 mg/dL (ref 2.5–4.6)
SODIUM: 154 mmol/L — AB (ref 135–145)

## 2015-01-28 LAB — BLOOD GAS, ARTERIAL
ACID-BASE EXCESS: 1.3 mmol/L (ref 0.0–2.0)
BICARBONATE: 25.7 meq/L — AB (ref 20.0–24.0)
Drawn by: 275531
FIO2: 0.28 %
O2 SAT: 95.8 %
PATIENT TEMPERATURE: 98.6
PH ART: 7.391 (ref 7.350–7.450)
PO2 ART: 78.9 mmHg — AB (ref 80.0–100.0)
TCO2: 27 mmol/L (ref 0–100)
pCO2 arterial: 43.4 mmHg (ref 35.0–45.0)

## 2015-01-28 LAB — APTT: APTT: 55 s — AB (ref 24–37)

## 2015-01-28 LAB — CBC
HCT: 35.3 % — ABNORMAL LOW (ref 39.0–52.0)
Hemoglobin: 10.6 g/dL — ABNORMAL LOW (ref 13.0–17.0)
MCH: 30.5 pg (ref 26.0–34.0)
MCHC: 30 g/dL (ref 30.0–36.0)
MCV: 101.7 fL — AB (ref 78.0–100.0)
Platelets: 153 10*3/uL (ref 150–400)
RBC: 3.47 MIL/uL — ABNORMAL LOW (ref 4.22–5.81)
RDW: 16.8 % — ABNORMAL HIGH (ref 11.5–15.5)
WBC: 10.1 10*3/uL (ref 4.0–10.5)

## 2015-01-28 LAB — PROTIME-INR
INR: 1.22 (ref 0.00–1.49)
PROTHROMBIN TIME: 15.6 s — AB (ref 11.6–15.2)

## 2015-01-28 LAB — HEPARIN LEVEL (UNFRACTIONATED): Heparin Unfractionated: 0.28 IU/mL — ABNORMAL LOW (ref 0.30–0.70)

## 2015-01-28 LAB — GLUCOSE, CAPILLARY
GLUCOSE-CAPILLARY: 135 mg/dL — AB (ref 65–99)
Glucose-Capillary: 141 mg/dL — ABNORMAL HIGH (ref 65–99)
Glucose-Capillary: 155 mg/dL — ABNORMAL HIGH (ref 65–99)
Glucose-Capillary: 128 mg/dL — ABNORMAL HIGH (ref 65–99)
Glucose-Capillary: 144 mg/dL — ABNORMAL HIGH (ref 65–99)

## 2015-01-28 MED ORDER — POTASSIUM CL IN DEXTROSE 5% 20 MEQ/L IV SOLN
20.0000 meq | INTRAVENOUS | Status: DC
  Administered 2015-01-28 – 2015-01-29 (×2): 20 meq via INTRAVENOUS
  Filled 2015-01-28 (×6): qty 1000

## 2015-01-28 MED ORDER — DEXTROSE 5 % IV SOLN
2.0000 g | Freq: Two times a day (BID) | INTRAVENOUS | Status: DC
  Administered 2015-01-28 – 2015-01-29 (×2): 2 g via INTRAVENOUS
  Filled 2015-01-28 (×3): qty 2

## 2015-01-28 MED ORDER — ISOSORB DINITRATE-HYDRALAZINE 20-37.5 MG PO TABS
1.0000 | ORAL_TABLET | Freq: Two times a day (BID) | ORAL | Status: DC
  Administered 2015-01-28 – 2015-01-29 (×3): 1 via ORAL
  Filled 2015-01-28 (×4): qty 1

## 2015-01-28 MED ORDER — NEPRO/CARBSTEADY PO LIQD
1000.0000 mL | ORAL | Status: DC
  Administered 2015-01-29: 1000 mL
  Filled 2015-01-28 (×2): qty 1000

## 2015-01-28 MED ORDER — PRO-STAT SUGAR FREE PO LIQD
60.0000 mL | Freq: Three times a day (TID) | ORAL | Status: DC
Start: 2015-01-28 — End: 2015-01-28

## 2015-01-28 MED ORDER — PRO-STAT SUGAR FREE PO LIQD
30.0000 mL | Freq: Three times a day (TID) | ORAL | Status: DC
  Administered 2015-01-28 – 2015-01-29 (×3): 30 mL
  Filled 2015-01-28 (×4): qty 30

## 2015-01-28 MED ORDER — FREE WATER
300.0000 mL | Status: DC
  Administered 2015-01-28 – 2015-01-29 (×4): 300 mL

## 2015-01-28 MED ORDER — HEPARIN (PORCINE) IN NACL 100-0.45 UNIT/ML-% IJ SOLN
1450.0000 [IU]/h | INTRAMUSCULAR | Status: DC
  Administered 2015-01-28 – 2015-01-29 (×2): 1450 [IU]/h via INTRAVENOUS
  Filled 2015-01-28 (×3): qty 250

## 2015-01-28 NOTE — Progress Notes (Signed)
Occupational Therapy Treatment Patient Details Name: [name] MRN: [id number] DOB: 08-Aug-1951 Today's Date: 01/28/2015    History of present illness 64 y.o. [name] initially admitted 4/22-5/14 for R rotator cuff repair by Ortho. Failed extubation post-op and reintubated 4/23. Post operative course complicated by prolonged respiratory respiratory failure, findings of testicular masses, acute renal failure requiring CVVHD, BLE DVT, and tracheostomy bleeding s/p revision. He was transferred to [facility] 5/14 PM and tracheostomy became dislodged, re-admitted to [facility] ICU 5/15 early am.    OT comments  PROM performed Rt UE.  Pt opened eyes x 1 and looked to therapist on Rt in response to his name being called.  No other responses.     Follow Up Recommendations  LTACH    Equipment Recommendations  None recommended by OT    Recommendations for Other Services      Precautions / Restrictions Precautions Precautions: Fall;Shoulder Type of Shoulder Precautions: Per order, ROM of wrist and elbow out of sling daily. Skin care and monitoring. Passive Right shoulder ROM out of the sling BID. Shoulder Interventions: Shoulder abduction pillow Precaution Booklet Issued: No Precaution Comments: shoulder, abductor sling on RUE, except for PROM to elbow and wrist; then removed for this per MD Required Braces or Orthoses: Sling Restrictions Weight Bearing Restrictions: Yes RUE Weight Bearing: Non weight bearing       Mobility Bed Mobility                  Transfers                      Balance                                   ADL                                         General ADL Comments: Rt UE bathed, lotion applied.  Minimal maceration beginning Rt axilla and elbow crease.  Anti fungal powder applied and RN notified       Vision                     Perception     Praxis      Cognition   Behavior During Therapy: Flat  affect Overall Cognitive Status: Impaired/Different from baseline Area of Impairment: Attention   Lown Attention Level: Focused            General Comments: Pt opened eyes x 1 and looked to therapist on Rt in response to his name being called.  No other eye opening.  No following commands     Extremity/Trunk Assessment               Exercises General Exercises - Upper Extremity Shoulder Flexion: PROM;Right;20 reps Shoulder ABduction: PROM;Right;20 reps Shoulder ADduction: PROM;Right;20 reps Shoulder Horizontal ABduction: PROM;Right;20 reps Shoulder Horizontal ADduction: PROM;Right;20 reps Elbow Flexion: PROM;Right;20 reps Elbow Extension: PROM;Right;20 reps Wrist Flexion: PROM;Right;20 reps Wrist Extension: PROM;Right;20 reps Digit Composite Flexion: PROM;Right;20 reps Composite Extension: PROM;Right;15 reps Shoulder Exercises Shoulder External Rotation: PROM;Right;20 reps Hand Exercises Forearm Supination: PROM;Right;20 reps Forearm Pronation: PROM;Right;20 reps   Shoulder Instructions       General Comments      Pertinent Vitals/ Pain       Pain Assessment: Faces Faces Pain  Scale: Hurts a little bit Pain Location: Pt with slight grimmace with ROM Rt UE  Pain Descriptors / Indicators: Grimacing Pain Intervention(s): Monitored during session  Home Living                                          Prior Functioning/Environment              Frequency Min 6X/week     Progress Toward Goals  OT Goals(Ramella goals can now be found in the care plan section)     ADL Goals Additional ADL Goal #1: Pt will follow commands 50% of the time. Additional ADL Goal #2: Pt will perform bed mobility with Max assist. Additional ADL Goal #3: Pt will perform bilateral UE ROM with Max assist.  Plan Discharge plan remains appropriate    Co-evaluation                 End of Session Equipment Utilized During Treatment: Oxygen (28% FIO2 via  trach collar )   Activity Tolerance Patient tolerated treatment well   Patient Left     Nurse Communication          Time: [id number] OT Time Calculation (min): 27 min  Charges: OT Treatments $Therapeutic Exercise: 23-37 mins  [name] 01/28/2015, 3:09 PM

## 2015-01-28 NOTE — Progress Notes (Signed)
PULMONARY / CRITICAL CARE MEDICINE   Name: [name] MRN: [id number] DOB: 1951-08-02    ADMISSION DATE:  01/20/2015  REFERRING MD :  [facility]   CHIEF COMPLAINT:  Tracheostomy Dislodgement  INITIAL PRESENTATION: [name] initially admitted 4/22-5/14 for R rotator cuff repair by Ortho.  Failed extubation post-op and reintubated 4/23.  Post operative course complicated by prolonged respiratory respiratory failure, findings of testicular masses, acute renal failure requiring CVVHD, BLE DVT, and tracheostomy bleeding s/p revision.  He was transferred to [facility] 5/14 PM and tracheostomy became dislodged, re-admitted to [facility] ICU 5/15 early am.    STUDIES:  4/23 US scrotum: Two solid extratesticular right scrotal masses. Primary differential diagnosis for extratesticular masses include primary or metastatic neoplasms including adenomatoid tumor, fibroma, leiomyoma, mesothelioma, or sarcoma. Urology consultation is recommended  4/26 TEE: LVEF 60-65%, Grade 1 DD, trivial pericardial effusion 4/27 [place] testes with masses 5/02 renal US: no hydronephrosis, Possible nonobstructing stone lower pole left kidney  SIGNIFICANT EVENTS: 4/22 - 5/14  Admit for R rotator cuff repair, complicated course with prolonged resp fx, renal fx, DVT, testicular masses 5/17 re-do tracheostomy, HCAP 5/19 UFH gtt resumed 5/20 tolerating ATC. No bleeding with UFH gtt.   SUBJECTIVE:   No acute events or changes Free water adjusted by renal Permcath to be removed  VITAL SIGNS: Temp:  [99.3 F (37.4 C)-101.1 F (38.4 C)] 100.5 F (38.1 C) (05/23 1138) Pulse Rate:  [87-104] 87 (05/23 1138) Resp:  [19-27] 19 (05/23 1138) BP: (132-175)/(58-82) 148/68 mmHg (05/23 1138) SpO2:  [95 %-100 %] 99 % (05/23 1138) FiO2 (%):  [28 %] 28 % (05/23 1138) Weight:  [142 kg (313 lb 0.9 oz)] 142 kg (313 lb 0.9 oz) (05/23 0401)   HEMODYNAMICS:     VENTILATOR SETTINGS: Vent Mode:  [-]  FiO2 (%):  [28 %] 28 %   INTAKE /  OUTPUT:  Intake/Output Summary (Last 24 hours) at 01/28/15 1208 Last data filed at 01/28/15 0905  Gross per 24 hour  Intake   1322 ml  Output   3475 ml  Net  -2153 ml    PHYSICAL EXAMINATION:  Gen:  NAD, obese, arouses to voice HENT: trach in place, ncat PULM: CTA B CV: RRR, no mgr GI: BS+, soft, nontender MSK: R arm in sling Derm: no skin breakdown Neuro: arouses to voice, does not follow commands, non-verbal  LABS:  CBC  Recent Labs Lab 01/26/15 0233 01/27/15 0322 01/28/15 0534  WBC 12.6* 11.9* 10.1  HGB 10.8* 10.7* 10.6*  HCT 35.6* 35.3* 35.3*  PLT 132* 152 153   Coag's No results for input(s): APTT, INR in the last 168 hours.   BMET  Recent Labs Lab 01/26/15 0232 01/27/15 0322 01/28/15 0534  NA 152* 152* 154*  K 3.7 3.3* 3.6  CL 115* 115* 120*  CO2 BUN 128* 118* 99*  CREATININE 1.75* 1.60* 1.44*  GLUCOSE 151* 144* 162*   Electrolytes  Recent Labs Lab 01/22/15 2200  01/23/15 1028 01/23/15 2220  01/26/15 0232 01/27/15 0322 01/28/15 0534  CALCIUM  --   < >  --   --   < > 8.8* 9.3 9.4  MG 2.3  --  2.4 2.3  --   --   --   --   PHOS  --   --   --   --   < > 5.5* 3.5 3.0  < > = values in this interval not displayed. Sepsis Markers  Recent Labs  Lab 01/22/15 1015 01/23/15 0430 01/24/15 0342  PROCALCITON 3.32 2.54 1.23     ABG No results for input(s): PHART, PCO2ART, PO2ART in the last 168 hours. Liver Enzymes  Recent Labs Lab 01/24/15 0342  01/26/15 0232 01/27/15 0322 01/28/15 0534  AST 57*  --   --   --   --   ALT 36  --   --   --   --   ALKPHOS 91  --   --   --   --   BILITOT 1.4*  --   --   --   --   ALBUMIN 2.5*  < > 2.5* 2.7* 2.7*  < > = values in this interval not displayed. Cardiac Enzymes No results for input(s): TROPONINI, PROBNP in the last 168 hours.   Glucose  Recent Labs Lab 01/27/15 1140 01/27/15 1538 01/27/15 2025 01/28/15 0009 01/28/15 0424 01/28/15 0820  GLUCAP 131* 150* 139* 135* 141* 155*     CXR: NNF I reviewed latest film- portable   ASSESSMENT / PLAN:  PULMONARY A: Chronic Hypoxic / Hypercapneic Respiratory Failure  Presumed OSA / OHS Tracheostomy tube dependence S/p redo tracheostomy 5/17> no bleeding R Pneumothorax, resolved Smoker  Possible HCAP,  5/17 P:   Continue 24 hour trach collar, but will check ABG 5/23 due to encephalopathy> is this hypercapnea?  CARDIOVASCULAR A:  Hx of HTN, chronic dCHF, HLD Extensive BLE DVT s/p IVC Filter placement  P:  Cont UFH gtt per pharmacy  Start warfarin 5/23 Continue PRN hydralazine Start Bidil bid  RENAL Tunneled HD Cath 5/13 >>  A:  AKI, recovering well Scrotal mass - AFP and beta HCG normal Hypernatremia, worse 5/23 P:  Monitor BMET and UOP Replace electrolytes as needed Increase free water to 300ml q3h  When recovers sufficiently from [place] illness, consider Urology eval for scrotal mass  GASTROINTESTINAL A:   High GRVs, resolved Constipation, resolved P:  SUP: enteral famotidine Continue TFs @ [place]agan rate Needs PEG, will consult IR Cont EES as motility agent (started 5/09)  HEMATOLOGIC A:   Anemia - ICU acquired + acute blood loss with tracheostomy bleeding > stable P:  DVT px: full dose UFH > transition to warfarin this week Monitor CBC intermittently Transfuse per usual ICU guidelines  INFECTIOUS A:  Fever 5/23, resolved > suspect related to pneumonia, but at risk for sinusitis with NG tube HCAP, 5/16 P:    5/16 blood cx >> 1/2 coag neg staph 5/16 resp cx >>NOS >>   5/23 blood culture > 5/23 resp culture  5/17 vanc >> 5/20 5/17 cefepime >> Plan through 5/27  Check CT head for encephalopathy and to look for sinusitis Remove NG tube  ENDOCRINE A:  Hyperglycemia without prior documentation of DM Elevated Hgb A1C (7.5 on 4/22) P:  Cont sensitive SSI   NEUROLOGIC A:  Acute encephalopathy > worse 5/23 than last week, hypercapnea? Fever? Hypernatremia? Post  op pain Severe deconditioning P:  Minimize sedating medications PT/OT Head CT 5/23 Treat underlying metabolic and infectious issues  ORTHO A: Rt shoulder surgery 4/22 P: Post op mgmt per Ortho  Continue ROM of RUL  Brother updated 5/23 by phone  [facility]  Heber CarolinaBrent McQuaid, MD [place] PCCM Pager: [phone] Cell: [phone] If no response, call [phone]

## 2015-01-28 NOTE — Progress Notes (Signed)
Nutrition Follow-up  DOCUMENTATION CODES:  Morbid obesity  INTERVENTION:  Prostat, Tube feeding   Nepro formula at goal rate of 40 ml/hr   Prostat liquid protein 30 ml TID  Total TF regimen to provide 2028 kcals, 123 gm protein, 698 ml of free water  NUTRITION DIAGNOSIS:  Inadequate oral intake related to inability to eat as evidenced by NPO status, ongoing  NEW GOAL:  Patient will meet greater than or equal to 90% of their needs, currently unmet  MONITOR:  Vent status, Weight trends, TF tolerance, I & O's  ASSESSMENT: [facility]M [facility] for right rotator cuff repair. Found testicular masses and acute renal failure requiring CRRT, and required a tracheostomy.  Patient transferred to 2C-Stepdown 5/18.  Patient is currently on ATC.  Nepro formula currently infusing at 20 ml/hr via NGT.  Also receiving Prostat liquid protein 60 ml TID.  Free water flushes at 300 ml every 3 hours.  Noted IR consulted for PEG tube placement.  Height:  Ht Readings from Last 1 Encounters:  01/20/15 5\' 11"  (1.803 m)    Weight:  Wt Readings from Last 1 Encounters:  01/28/15 313 lb 0.9 oz (142 kg)    Ideal Body Weight:  78.2 kg  Wt Readings from Last 10 Encounters:  01/28/15 313 lb 0.9 oz (142 kg)  01/19/15 326 lb 4.5 oz (148 kg)  11/29/14 360 lb 3.2 oz (163.386 kg)    BMI:  Body mass index is 43.68 kg/(m^2).  Re-estimated Nutritional Needs:  Kcal:  2000-2200  Protein:  120-130 gm  Fluid:  per MD  Skin:   Stage II pressure ulcer to buttocks  Diet Order:   NPO  EDUCATION NEEDS:  No education needs identified at this time   Intake/Output Summary (Last 24 hours) at 01/28/15 1447 Last data filed at 01/28/15 0905  Gross per 24 hour  Intake   1052 ml  Output   3475 ml  Net  -2423 ml    Last BM:  5/21  Maureen ChattersKatie Imagine Nest, RD, LDN Pager #:  [phone] After-Hours Pager #: [phone]

## 2015-01-28 NOTE — Progress Notes (Signed)
[facility] ROUNDING NOTE   Subjective:   Interval History:  No issues today appears stable     Objective:  Vital signs in last 24 hours:  Temp:  [99.3 F (37.4 C)-101.1 F (38.4 C)] 100.6 F (38.1 C) (05/23 0848) Pulse Rate:  [86-104] 95 (05/23 0848) Resp:  [19-27] 23 (05/23 0848) BP: (153-175)/(58-82) 165/72 mmHg (05/23 0814) SpO2:  [95 %-100 %] 99 % (05/23 0848) FiO2 (%):  [28 %] 28 % (05/23 0848) Weight:  [142 kg (313 lb 0.9 oz)] 142 kg (313 lb 0.9 oz) (05/23 0401)  Weight change: -1 kg (-2 lb 3.3 oz) Filed Weights   01/25/15 0500 01/26/15 0900 01/28/15 0401  Weight: 148 kg (326 lb 4.5 oz) 143 kg (315 lb 4.1 oz) 142 kg (313 lb 0.9 oz)    Intake/Output: I/O last 3 completed shifts: In: 2284 [I.V.:774; NG/GT:1410; IV Piggyback:100] Out: 5925 [Urine:5925]   Intake/Output this shift:  Total I/O In: -  Out: 650 [Urine:650]  CVS- RRR RS- CTA trach collar ABD- BS present soft non-distended EXT- no edema   Basic Metabolic Panel:  Recent Labs Lab 01/21/15 2228  01/22/15 1015 01/22/15 2200  01/23/15 1028 01/23/15 2220 01/24/15 0342 01/25/15 0248 01/26/15 0232 01/27/15 0322 01/28/15 0534  NA  --   < > 146*  --   < >  --   --  146* 148* 152* 152* 154*  K  --   < > 5.0  --   < >  --   --  3.7 3.7 3.7 3.3* 3.6  CL  --   < > 104  --   < >  --   --  112* 114* 115* 115* 120*  CO2  --   < > 23  --   < >  --   --  22 24 24 26 24   GLUCOSE  --   < > 133*  --   < >  --   --  130* 156* 151* 144* 162*  BUN  --   < > 173*  --   < >  --   --  129* 128* 128* 118* 99*  CREATININE  --   < > 5.58*  --   < >  --   --  2.71* 2.16* 1.75* 1.60* 1.44*  CALCIUM  --   < > 8.9  --   < >  --   --  8.0* 9.0 8.8* 9.3 9.4  MG 3.0*  --  3.0* 2.3  --  2.4 2.3  --   --   --   --   --   PHOS 9.8*  < > 11.7*  --   --   --   --  6.6* 5.6* 5.5* 3.5 3.0  < > = values in this interval not displayed.  Liver Function Tests:  Recent Labs Lab 01/24/15 0342 01/25/15 0248  01/26/15 0232 01/27/15 0322 01/28/15 0534  AST 57*  --   --   --   --   ALT 36  --   --   --   --   ALKPHOS 91  --   --   --   --   BILITOT 1.4*  --   --   --   --   PROT 6.1*  --   --   --   --   ALBUMIN 2.5* 2.5* 2.5* 2.7* 2.7*   No results for input(s): LIPASE, AMYLASE in the last 168 hours.  No results for input(s): AMMONIA in the last 168 hours.  CBC:  Recent Labs Lab 01/23/15 0430 01/24/15 0342 01/25/15 0248 01/26/15 0233 01/27/15 0322 01/28/15 0534  WBC 14.1* 11.8* 11.6* 12.6* 11.9* 10.1  NEUTROABS 10.5*  --   --   --   --   --   HGB 10.5* 10.2* 10.8* 10.8* 10.7* 10.6*  HCT 33.7* 33.5* 35.5* 35.6* 35.3* 35.3*  MCV 98.3 98.5 99.4 101.7* 99.7 101.7*  PLT 149* 138* 138* 132* 152 153    Cardiac Enzymes: No results for input(s): CKTOTAL, CKMB, CKMBINDEX, TROPONINI in the last 168 hours.  BNP: Invalid input(s): POCBNP  CBG:  Recent Labs Lab 01/27/15 1538 01/27/15 2025 01/28/15 0009 01/28/15 0424 01/28/15 0820  GLUCAP 150* 139* 135* 141* 155*    Microbiology: Results for orders placed or performed during the hospital encounter of 01/20/15  Culture, blood (routine x 2)     Status: None   Collection Time: 01/21/15 11:42 AM  Result Value Ref Range Status   Specimen Description BLOOD LEFT HAND  Final   Special Requests BOTTLES DRAWN AEROBIC ONLY 1.5CC  Final   Culture   Final    STAPHYLOCOCCUS SPECIES (COAGULASE NEGATIVE) Note: THE SIGNIFICANCE OF ISOLATING THIS ORGANISM FROM A SINGLE SET OF BLOOD CULTURES WHEN MULTIPLE SETS ARE DRAWN IS UNCERTAIN. PLEASE NOTIFY THE MICROBIOLOGY DEPARTMENT WITHIN ONE WEEK IF SPECIATION AND SENSITIVITIES ARE REQUIRED. Note: Culture results may be compromised due to an inadequate volume of blood received in culture bottles. Gram Stain Report Called to,Read Back By and Verified With: JENNIFER CLIFTON  01/22/15 Performed at [facility]    Report Status 01/24/2015 FINAL  Final  Culture, blood (routine x 2)      Status: None   Collection Time: 01/21/15 11:49 AM  Result Value Ref Range Status   Specimen Description BLOOD PICC LINE  Final   Special Requests BOTTLES DRAWN AEROBIC AND ANAEROBIC  8CC EACH  Final   Culture   Final    NO GROWTH 5 DAYS Performed at [facility]    Report Status 01/27/2015 FINAL  Final  Culture, respiratory (NON-Expectorated)     Status: None   Collection Time: 01/21/15 12:47 PM  Result Value Ref Range Status   Specimen Description TRACHEAL ASPIRATE  Final   Special Requests Normal  Final   Gram Stain   Final    RARE WBC PRESENT, PREDOMINANTLY PMN NO SQUAMOUS EPITHELIAL CELLS SEEN NO ORGANISMS SEEN Performed at [facility]    Culture   Final    FEW CANDIDA ALBICANS Performed at [facility]    Report Status 01/23/2015 FINAL  Final  Wound culture     Status: None   Collection Time: 01/24/15 10:04 PM  Result Value Ref Range Status   Specimen Description WOUND  Final   Special Requests PENILE DRAINAGE  Final   Gram Stain   Final    FEW WBC PRESENT,BOTH PMN AND MONONUCLEAR FEW SQUAMOUS EPITHELIAL CELLS PRESENT NO ORGANISMS SEEN Performed at [facility]    Culture   Final    FEW STAPHYLOCOCCUS SPECIES (COAGULASE NEGATIVE) RARE CANDIDA ALBICANS Performed at [facility]    Report Status 01/27/2015 FINAL  Final    Coagulation Studies: No results for input(s): LABPROT, INR in the last 72 hours.  Urinalysis: No results for input(s): COLORURINE, LABSPEC, PHURINE, GLUCOSEU, HGBUR, BILIRUBINUR, KETONESUR, PROTEINUR, UROBILINOGEN, NITRITE, LEUKOCYTESUR in the last 72 hours.  Invalid input(s): APPERANCEUR    Imaging: No  results found.   Medications:   . dextrose 5 % with KCl 20 mEq / L    . heparin 1,350 Units/hr (01/27/15 2315)   . antiseptic oral rinse  7 mL Mouth Rinse q12n4p  . ceFEPime (MAXIPIME) IV  1 g Intravenous Q12H  . chlorhexidine  15 mL Mouth Rinse BID  . erythromycin  400 mg Per Tube 3 times  per day  . famotidine  20 mg Per Tube Daily  . feeding supplement (NEPRO CARB STEADY)  1,000 mL Per Tube Q24H  . feeding supplement (PRO-STAT SUGAR FREE 64)  60 mL Per Tube 5 X Daily  . folic acid  1 mg Oral Daily  . free water  200 mL Per Tube 6 times per day  . heparin  40 Units/kg Dialysis Once in dialysis  . insulin aspart  0-9 Units Subcutaneous 6 times per day   sodium chloride, sodium chloride, acetaminophen, feeding supplement (NEPRO CARB STEADY), fentaNYL, heparin, hydrALAZINE, lidocaine (PF), lidocaine-prilocaine, lip balm, LORazepam, ondansetron (ZOFRAN) IV, pentafluoroprop-tetrafluoroeth  Assessment/ Plan:   Acute renal failure secondary to acute tubular necrosis baseline creatinine appears to be 0.9 now with remarkable improvement  Hypernatremia  Will substitute with free water  Hypokalemia improved  Will sign of  Tomorrow if all is stable   LOS: 8 Raechelle Sarti W  :47 AM

## 2015-01-28 NOTE — Progress Notes (Signed)
ANTICOAGULATION CONSULT NOTE - Follow Up Consult  Pharmacy Consult:  Heparin  / Coumadin (Overlap D#1) Indication: DVT   No Known Allergies  Patient Measurements: Height: 5\' 11"  (180.3 cm) Weight: (!) 313 lb 0.9 oz (142 kg) IBW/kg (Calculated) : 75.3 Heparin Dosing Weight: 109 kg  Vital Signs: Temp: 100.5 F (38.1 C) (05/23 1138) Temp Source: Core (Comment) (05/23 0814) BP: 148/68 mmHg (05/23 1138) Pulse Rate: 87 (05/23 1138)  Labs:  Recent Labs  01/26/15 0232  01/26/15 0233  01/26/15 2046 01/27/15 0322 01/28/15 0534  HGB  --   < > 10.8*  --   --  10.7* 10.6*  HCT  --   --  35.6*  --   --  35.3* 35.3*  PLT  --   --  132*  --   --  152 153  HEPARINUNFRC  --   --   --   < > 0.46 0.44 0.28*  CREATININE 1.75*  --   --   --   --  1.60* 1.44*  < > = values in this interval not displayed.  Estimated Creatinine Clearance: 77.7 mL/min (by C-G formula based on Cr of 1.44).     Assessment: [age] YOM with recent bilateral DVT s/p IVC filter on 01/09/15 continuing on IV heparin.  Heparin level slightly sub-therapeutic this AM.  No complication with infusion per RN.  Patient to have PEG tub placed and start Coumadin afterward.  INR was 1.07 on 01/09/15.  No bleeding reported.   Patient continues on cefepime for PNA.  Patient's renal function is improving and likely no further HD.  Vanc 5/17 >> 5/20 Cefepime 5/17 >> (plan 5/27)  5/16 BCx x2 - CoNS in 1/2 - likely contaminant 5/16 TA - C albicans 5/19 Wound cx - few candida   Goal of Therapy:  Heparin level 0.3-0.7 units/ml Monitor platelets by anticoagulation protocol: Yes  Resolution of infection    Plan:                           - Increase heparin gtt to 1450 units/hr, f/u resume post PEG - Coumadin 7.5mg  PO today, start after PEG placement - Daily HL / CBC / PT / INR - Change cefepime to 2gm IV Q12H - Monitor renal fxn, clinical progress    Angely Dietz D. Laney Potashang, PharmD, BCPS Pager:  [phone] 01/28/2015, 12:42  PM

## 2015-01-29 ENCOUNTER — Inpatient Hospital Stay
Admission: AD | Admit: 2015-01-29 | Discharge: 2015-02-14 | Disposition: A | Discharge status: Other Acute Inpatient Hospital | Payer: Self-pay | Source: Ambulatory Visit | Attending: Internal Medicine | Admitting: Internal Medicine

## 2015-01-29 ENCOUNTER — Institutional Professional Consult (permissible substitution) (HOSPITAL_COMMUNITY): Payer: Self-pay

## 2015-01-29 DIAGNOSIS — Z452 Encounter for adjustment and management of vascular access device: Secondary | ICD-10-CM

## 2015-01-29 DIAGNOSIS — Z4659 Encounter for fitting and adjustment of other gastrointestinal appliance and device: Secondary | ICD-10-CM

## 2015-01-29 DIAGNOSIS — Z431 Encounter for attention to gastrostomy: Secondary | ICD-10-CM

## 2015-01-29 DIAGNOSIS — J9611 Chronic respiratory failure with hypoxia: Secondary | ICD-10-CM

## 2015-01-29 DIAGNOSIS — E870 Hyperosmolality and hypernatremia: Secondary | ICD-10-CM

## 2015-01-29 DIAGNOSIS — J9690 Respiratory failure, unspecified, unspecified whether with hypoxia or hypercapnia: Secondary | ICD-10-CM

## 2015-01-29 DIAGNOSIS — K567 Ileus, unspecified: Secondary | ICD-10-CM

## 2015-01-29 DIAGNOSIS — Z09 Encounter for follow-up examination after completed treatment for conditions other than malignant neoplasm: Secondary | ICD-10-CM

## 2015-01-29 LAB — HEPARIN LEVEL (UNFRACTIONATED)
Heparin Unfractionated: 0.46 IU/mL (ref 0.30–0.70)
Heparin Unfractionated: <0.1 IU/mL — ABNORMAL LOW (ref 0.30–0.70)

## 2015-01-29 LAB — BASIC METABOLIC PANEL
Anion gap: 9 (ref 5–15)
BUN: 88 mg/dL — ABNORMAL HIGH (ref 6–20)
CO2: 26 mmol/L (ref 22–32)
CREATININE: 1.28 mg/dL — AB (ref 0.61–1.24)
Calcium: 9.7 mg/dL (ref 8.9–10.3)
Chloride: 119 mmol/L — ABNORMAL HIGH (ref 101–111)
GFR calc Af Amer: >60 mL/min (ref >60)
GFR, EST NON AFRICAN AMERICAN: 59 mL/min — AB (ref >60)
Glucose, Bld: 141 mg/dL — ABNORMAL HIGH (ref 65–99)
POTASSIUM: 3.3 mmol/L — AB (ref 3.5–5.1)
Sodium: 154 mmol/L — ABNORMAL HIGH (ref 135–145)

## 2015-01-29 LAB — GLUCOSE, CAPILLARY
Glucose-Capillary: 123 mg/dL — ABNORMAL HIGH (ref 65–99)
Glucose-Capillary: 128 mg/dL — ABNORMAL HIGH (ref 65–99)
Glucose-Capillary: 131 mg/dL — ABNORMAL HIGH (ref 65–99)
Glucose-Capillary: 151 mg/dL — ABNORMAL HIGH (ref 65–99)

## 2015-01-29 LAB — CBC
HCT: 34.1 % — ABNORMAL LOW (ref 39.0–52.0)
Hemoglobin: 10.3 g/dL — ABNORMAL LOW (ref 13.0–17.0)
MCH: 30.7 pg (ref 26.0–34.0)
MCHC: 30.2 g/dL (ref 30.0–36.0)
MCV: 101.5 fL — AB (ref 78.0–100.0)
Platelets: 155 10*3/uL (ref 150–400)
RBC: 3.36 MIL/uL — AB (ref 4.22–5.81)
RDW: 17 % — ABNORMAL HIGH (ref 11.5–15.5)
WBC: 10.1 10*3/uL (ref 4.0–10.5)

## 2015-01-29 LAB — PROTIME-INR
INR: 1.13 (ref 0.00–1.49)
Prothrombin Time: 14.7 seconds (ref 11.6–15.2)

## 2015-01-29 MED ORDER — FAMOTIDINE 40 MG/5ML PO SUSR: 20.0000 mg | Freq: Every day | ORAL | Status: DC

## 2015-01-29 MED ORDER — CHLORHEXIDINE GLUCONATE 0.12 % MT SOLN: 15.0000 mL | Freq: Two times a day (BID) | OROMUCOSAL | Status: DC

## 2015-01-29 MED ORDER — PRO-STAT SUGAR FREE PO LIQD: 30.0000 mL | Freq: Three times a day (TID) | ORAL | Status: DC

## 2015-01-29 MED ORDER — POTASSIUM CHLORIDE 20 MEQ/15ML (10%) PO SOLN: 40.0000 meq | Freq: Once | ORAL | Status: DC

## 2015-01-29 MED ORDER — INSULIN ASPART 100 UNIT/ML ~~LOC~~ SOLN
0.0000 [IU] | SUBCUTANEOUS | Status: DC
Start: 2015-01-29 — End: 2015-03-25

## 2015-01-29 MED ORDER — FENTANYL CITRATE (PF) 100 MCG/2ML IJ SOLN: 25.0000 ug | INTRAMUSCULAR | Status: DC | PRN

## 2015-01-29 MED ORDER — LORAZEPAM 2 MG/ML IJ SOLN
0.5000 mg | INTRAMUSCULAR | Status: DC | PRN
Start: 2015-01-29 — End: 2015-03-25

## 2015-01-29 MED ORDER — FREE WATER: 300.0000 mL | Status: DC

## 2015-01-29 MED ORDER — ACETAMINOPHEN 160 MG/5ML PO SOLN: 650.0000 mg | Freq: Four times a day (QID) | ORAL | Status: DC | PRN

## 2015-01-29 MED ORDER — SODIUM CHLORIDE 0.9 % IV SOLN: 250.0000 mL | INTRAVENOUS | Status: DC | PRN

## 2015-01-29 MED ORDER — HEPARIN (PORCINE) IN NACL 100-0.45 UNIT/ML-% IJ SOLN: 1450.0000 [IU]/h | INTRAMUSCULAR | Status: DC

## 2015-01-29 MED ORDER — CETYLPYRIDINIUM CHLORIDE 0.05 % MT LIQD: 7.0000 mL | Freq: Two times a day (BID) | OROMUCOSAL | Status: DC

## 2015-01-29 MED ORDER — HYDRALAZINE HCL 20 MG/ML IJ SOLN: 10.0000 mg | INTRAMUSCULAR | Status: DC | PRN

## 2015-01-29 MED ORDER — DEXTROSE 5 % IV SOLN: 2.0000 g | Freq: Two times a day (BID) | INTRAVENOUS | Status: DC

## 2015-01-29 MED ORDER — POTASSIUM CL IN DEXTROSE 5% 20 MEQ/L IV SOLN: 20.0000 meq | INTRAVENOUS | Status: DC

## 2015-01-29 MED ORDER — ERYTHROMYCIN ETHYLSUCCINATE 400 MG/5ML PO SUSR: 400.0000 mg | Freq: Three times a day (TID) | ORAL | Status: DC

## 2015-01-29 MED ORDER — FREE WATER
300.0000 mL | Status: DC
  Administered 2015-01-29 (×2): 300 mL

## 2015-01-29 MED ORDER — NEPRO/CARBSTEADY PO LIQD: 1000.0000 mL | ORAL | Status: DC

## 2015-01-29 MED ORDER — ONDANSETRON HCL 4 MG/2ML IJ SOLN: 4.0000 mg | Freq: Four times a day (QID) | INTRAMUSCULAR | Status: DC | PRN

## 2015-01-29 MED ORDER — FOLIC ACID 1 MG PO TABS: 1.0000 mg | ORAL_TABLET | Freq: Every day | ORAL | Status: DC

## 2015-01-29 MED ORDER — ISOSORB DINITRATE-HYDRALAZINE 20-37.5 MG PO TABS: 1.0000 | ORAL_TABLET | Freq: Two times a day (BID) | ORAL | Status: DC

## 2015-01-29 NOTE — Progress Notes (Signed)
PULMONARY / CRITICAL CARE MEDICINE   Name: [name] MRN: [id number] DOB: [birth year]    ADMISSION DATE:  01/20/2015  [name]   CHIEF COMPLAINT:  Tracheostomy Dislodgement  INITIAL PRESENTATION: [name] initially admitted 4/22-5/14 for R rotator cuff repair by Ortho.  Failed extubation post-op and reintubated 4/23.  Post operative course complicated by prolonged respiratory respiratory failure, findings of testicular masses, acute renal failure requiring CVVHD, BLE DVT, and tracheostomy bleeding s/p revision.  [name] and tracheostomy became dislodged, [name].    STUDIES:  4/23 US scrotum: Two solid extratesticular right scrotal masses. Primary differential diagnosis for extratesticular masses include primary or metastatic neoplasms including adenomatoid tumor, fibroma, leiomyoma, mesothelioma, or sarcoma. Urology consultation is recommended  4/26 TEE: LVEF 60-65%, Grade 1 DD, trivial pericardial effusion 4/27 US testes with masses 5/02 renal US: no hydronephrosis, Possible nonobstructing stone lower pole left kidney 5/23 CT head > NAICP  SIGNIFICANT EVENTS: 4/22 - 5/14  Admit for R rotator cuff repair, complicated course with prolonged resp fx, renal fx, DVT, testicular masses 5/17 re-do tracheostomy, HCAP 5/19 UFH gtt resumed 5/20 tolerating ATC. No bleeding with UFH gtt.   SUBJECTIVE:   More awake today, temperature improving, still hypernatremic  VITAL SIGNS: Temp:  [100.1 F (37.8 C)-100.8 F (38.2 C)] 100.1 F (37.8 C) (05/24 0740) Pulse Rate:  [75-101] 92 (05/24 0740) Resp:  [12-27] 20 (05/24 0740) BP: (107-168)/(58-84) 168/62 mmHg (05/24 0740) SpO2:  [96 %-100 %] 100 % (05/24 0740) FiO2 (%):  [28 %] 28 % (05/24 0740) Weight:  [143 kg (315 lb 4.1 oz)] 143 kg (315 lb 4.1 oz) (05/24 0500)   HEMODYNAMICS:     VENTILATOR SETTINGS: Vent Mode:  [-]  FiO2 (%):  [28 %] 28 %   INTAKE /  OUTPUT:  Intake/Output Summary (Last 24 hours) at 01/29/15 1042 Last data filed at 01/29/15 0827  Gross per 24 hour  Intake 4194.92 ml  Output   1500 ml  Net 2694.92 ml    PHYSICAL EXAMINATION:  Gen:  NAD, obese, arouses to voice HENT: trach in place, ncat PULM: CTA B CV: RRR, no mgr GI: BS+, soft, nontender MSK: R arm in sling Derm: no skin breakdown Neuro: arouses to voice, making movements spontaneously   LABS:  CBC  Recent Labs Lab 01/27/15 0322 01/28/15 0534 01/29/15 0305  WBC 11.9* 10.1 10.1  HGB 10.7* 10.6* 10.3*  HCT 35.3* 35.3* 34.1*  PLT 152 153 155   Coag's  Recent Labs Lab 01/28/15 2009 01/29/15 0305  APTT 55*  --   INR 1.22 1.13     BMET  Recent Labs Lab 01/27/15 0322 01/28/15 0534 01/29/15 0305  NA 152* 154* 154*  K 3.3* 3.6 3.3*  CL 115* 120* 119*  CO2 26 24 26   BUN 118* 99* 88*  CREATININE 1.60* 1.44* 1.28*  GLUCOSE 144* 162* 141*   Electrolytes  Recent Labs Lab 01/22/15 2200  01/23/15 1028 01/23/15 2220  01/26/15 0232 01/27/15 0322 01/28/15 0534 01/29/15 0305  CALCIUM  --   < >  --   --   < > 8.8* 9.3 9.4 9.7  MG 2.3  --  2.4 2.3  --   --   --   --   --   PHOS  --   --   --   --   < > 5.5* 3.5 3.0  --   < > =  values in this interval not displayed. Sepsis Markers  Recent Labs Lab 01/23/15 0430 01/24/15 0342  PROCALCITON 2.54 1.23     ABG  Recent Labs Lab 01/28/15 1518  PHART 7.391  PCO2ART 43.4  PO2ART 78.9*   Liver Enzymes  Recent Labs Lab 01/24/15 0342  01/26/15 0232 01/27/15 0322 01/28/15 0534  AST 57*  --   --   --   --   ALT 36  --   --   --   --   ALKPHOS 91  --   --   --   --   BILITOT 1.4*  --   --   --   --   ALBUMIN 2.5*  < > 2.5* 2.7* 2.7*  < > = values in this interval not displayed. Cardiac Enzymes No results for input(s): TROPONINI, PROBNP in the last 168 hours.   Glucose  Recent Labs Lab 01/28/15 0820 01/28/15 1245 01/28/15 2011 01/29/15 0024 01/29/15 0413  01/29/15 0944  GLUCAP 155* 128* 144* 123* 131* 128*    5/23 CT head images reviewed > no sinusitis noted, NAICP   ASSESSMENT / PLAN:  PULMONARY A: Chronic Hypoxic / Hypercapneic Respiratory Failure > improving, needs long term trach management Presumed OSA / OHS Tracheostomy tube dependence S/p redo tracheostomy 5/17> no bleeding R Pneumothorax, resolved Smoker  Possible HCAP,  5/17 P:   Continue 24 hour trach collar, but will check ABG 5/23 due to encephalopathy> is this hypercapnea? Would only consider decannulation when he is up, walking, participating in PT  CARDIOVASCULAR A:  Hx of HTN, chronic dCHF, HLD Extensive BLE DVT s/p IVC Filter placement  P:  Cont UFH gtt per pharmacy  Start warfarin after PEG placed Continue PRN hydralazine Bidil bid  RENAL Tunneled HD Cath 5/13 >> 5/23 A:  AKI, recovering well Scrotal mass - AFP and beta HCG normal Hypernatremia, continued 5/24 P:  Monitor BMET and UOP Replace electrolytes as needed Increase free water to q2h  When recovers sufficiently from [name], consider Urology eval for scrotal mass  GASTROINTESTINAL A:   High GRVs, resolved Constipation, resolved P:  SUP: enteral famotidine Continue TFs @ [name] rate IR consulted for PEG Cont EES as motility agent (started 5/09)  HEMATOLOGIC A:   Anemia - ICU acquired + acute blood loss with tracheostomy bleeding > stable P:  DVT px: full dose UFH > transition to warfarin this week Monitor CBC intermittently Transfuse per usual ICU guidelines  INFECTIOUS A:  Fever 5/23, resolved > line related? (improved after removing HD cath) HCAP, 5/16 P:    5/16 blood cx >> 1/2 coag neg staph 5/16 resp cx >>NOS >>   5/23 blood culture > 5/23 resp culture >  5/17 vanc >> 5/20 5/17 cefepime >> Plan through 5/27   ENDOCRINE A:  Hyperglycemia without prior documentation of DM Elevated Hgb A1C (7.5 on 4/22) P:  Cont sensitive SSI    NEUROLOGIC A:  Acute encephalopathy > persistent, due to hypernatremia? Post op pain Severe deconditioning P:  Minimize sedating medications PT/OT Treat underlying metabolic and infectious issues  ORTHO A: Rt shoulder surgery 4/22 P: Post op mgmt per Ortho  Continue ROM of RUL  Brother updated 5/23 by phone  Likely transfer to [facility] today  He[place] , MD [place] PCCM Pager: [phone] Cell: [phone] If no response, call [phone]

## 2015-01-29 NOTE — Progress Notes (Signed)
ANTICOAGULATION CONSULT NOTE - Follow Up Consult  Pharmacy Consult:  Heparin Indication: DVT   No Known Allergies  Patient Measurements: Height: 5\' 11"  (180.3 cm) Weight: (!) 315 lb 4.1 oz (143 kg) IBW/kg (Calculated) : 75.3 Heparin Dosing Weight: 109 kg  Vital Signs: Temp: 99.7 F (37.6 C) (05/24 1300) Temp Source: Core (Comment) (05/24 0740) BP: 125/58 mmHg (05/24 1300) Pulse Rate: 90 (05/24 1300)  Labs:  Recent Labs  01/27/15 0322 01/28/15 0534 01/28/15 2009 01/29/15 0305  HGB 10.7* 10.6*  --  10.3*  HCT 35.3* 35.3*  --  34.1*  PLT 152 153  --  155  APTT  --   --  55*  --   LABPROT  --   --  15.6* 14.7  INR  --   --  1.22 1.13  HEPARINUNFRC 0.44 0.28*  --  0.46  CREATININE 1.60* 1.44*  --  1.28*    Estimated Creatinine Clearance: 87.8 mL/min (by C-G formula based on Cr of 1.28).     Assessment: [age] YOM with recent bilateral DVT s/p IVC filter on 01/09/15 continuing on IV heparin.  Heparin level therapeutic and no bleeding reported.  Patient to have PEG tub placed yesterday and it was delayed, thus, Coumadin was not started.  Patient to be discharged to [facility] today and the plan is still to have PEG placed by IR.     Goal of Therapy:  Heparin level 0.3-0.7 units/ml Monitor platelets by anticoagulation protocol: Yes  Resolution of infection     Plan:                           - Continue heparin gtt at 1450 units/hr, f/u resume post PEG - Daily HL / CBC - D/C Coumadin consult per discussion with PCCM NP.  Resumption to be addressed by [facility] provider post PEG placement. - Continue Cefepime 2gm IV Q12H - Monitor renal fxn, clinical progress - F/U increase Pepcid back to BID given improved renal function    Linzie Criss D. Laney Potashang, PharmD, BCPS Pager:  [phone] 01/29/2015, 1:40 PM

## 2015-01-29 NOTE — Progress Notes (Signed)
eLink Physician-Brief Progress Note Patient Name: [name] DOB: Feb 04, 1951 MRN: [id number]   Date of Service  01/29/2015  HPI/Events of Note  Hypokalemia  eICU Interventions  Potassium replaced     Intervention Category Minor Interventions: Electrolytes abnormality - evaluation and management  DETERDING,ELIZABETH 01/29/2015, 4:27 AM

## 2015-01-29 NOTE — Progress Notes (Signed)
Occupational Therapy Treatment Patient Details Name: [name] MRN: [id number] DOB: [birth year] Today's Date: 01/29/2015    History of present illness 64 y.o. M initially admitted 4/22-5/14 for R rotator cuff repair by Ortho. Failed extubation post-op and reintubated 4/23. Post operative course complicated by prolonged respiratory respiratory failure, findings of testicular masses, acute renal failure requiring CVVHD, BLE DVT, and tracheostomy bleeding s/p revision. He was transferred to [facility] 5/14 PM and tracheostomy became dislodged, re-admitted to [facility] ICU 5/15 early am.    OT comments  Total A for bath at bed level, rolling to wash back. PROM performed R UE and L UE. Pt. Opened eyes several times and looked at therapist in response to positioning and name being called. No other responses. Back strap removed from sling due to skin irritation.   Follow Up Recommendations  LTACH    Equipment Recommendations  None recommended by OT    Recommendations for Other Services      Precautions / Restrictions Precautions Precautions: Fall;Shoulder Type of Shoulder Precautions: Per order, ROM of wrist and elbow out of sling daily. Skin care and monitoring. Passive Right shoulder ROM out of the sling BID. Shoulder Interventions: Shoulder abduction pillow Precaution Booklet Issued: No Precaution Comments: shoulder, abductor sling on RUE, except for PROM to elbow and wrist; then removed for this per MD (waist belt removed to prevent skin injury to back) Required Braces or Orthoses: Sling Restrictions Weight Bearing Restrictions: Yes RUE Weight Bearing: Non weight bearing       Mobility Bed Mobility Overal bed mobility: +2 for physical assistance Bed Mobility: Rolling Rolling: Total assist;+2 for physical assistance         General bed mobility comments: pt. did not follow commands to initiate rolling, or assist once rolling initiated.  Transfers                       Balance                                   ADL Overall ADL's : Needs assistance/impaired         Upper Body Bathing: Total assistance;Bed level   Lower Body Bathing: Total assistance;Bed level;+2 for physical assistance   Upper Body Dressing : Total assistance;Bed level                     General ADL Comments: Pt. is total A for ADLs. He opens his eyes occasionally and moves left arm. Washed pt's back and repositioned in bed. Removed sling and performed PROM to R shoulder, elbow, wrist, forearm and fingers.      Vision                     Perception     Praxis      Cognition   Behavior During Therapy: Flat affect Overall Cognitive Status: Impaired/Different from baseline                  General Comments: pt. opened eyes several times during session and looked to therapist in response to movement and name being called. No following commands     Extremity/Trunk Assessment               Exercises General Exercises - Upper Extremity Shoulder Flexion: PROM;Right;10 reps Elbow Flexion: PROM;Right;10 reps Elbow Extension: PROM;Right;10 reps Wrist Flexion: PROM;Right;10 reps Wrist Extension: PROM;Right;10 reps Digit  Composite Flexion: PROM;Right;10 reps Composite Extension: PROM;Right;10 reps Shoulder Exercises Shoulder External Rotation: PROM;Right;10 reps Hand Exercises Forearm Supination: PROM;Right;10 reps Forearm Pronation: PROM;Right;10 reps Other Exercises Other Exercises: PROM L shoulder all areas x 10   Shoulder Instructions       General Comments      Pertinent Vitals/ Pain       Pain Assessment: Faces Faces Pain Scale: Hurts a little bit Pain Location: pt. with slight grimmace when legs repositioned Pain Descriptors / Indicators: Grimacing Pain Intervention(s): Monitored during session  Home Living                                          Prior Functioning/Environment               Frequency Min 6X/week     Progress Toward Goals  OT Goals([place] goals can now be found in the care plan section)     Acute Rehab OT Goals Patient Stated Goal: unable to state OT Goal Formulation: Patient unable to participate in goal setting ADL Goals Additional ADL Goal #1: Pt will follow commands 50% of the time. Additional ADL Goal #2: Pt will perform bed mobility with Max assist. Additional ADL Goal #3: Pt will perform bilateral UE ROM with Max assist.  Plan Discharge plan remains appropriate    Co-evaluation                 End of Session Equipment Utilized During Treatment: Oxygen   Activity Tolerance Patient tolerated treatment well   Patient Left in bed   Nurse Communication Precautions        Time: [id number] OT Time Calculation (min): 26 min  Charges: OT General Charges $OT Visit: 1 Procedure OT Treatments $Therapeutic Activity: 8-22 mins $Therapeutic Exercise: 8-22 mins  Marden NobleMary Rebecca Tashya Alberty 01/29/2015, 12:12 PM

## 2015-01-29 NOTE — Progress Notes (Signed)
Patient ID: [name], male   DOB: 19-Oct-1950, 64 y.o.   MRN: [id number]   Hypoxia Resp failure Encephalopathy/ deconditioning Malnutrition Need for long term care  Request for percutaneous gastric tube placement  Labs good CT abd in review T max 100.8 last night  Will review CT with Radiologist asap Will follow temp  [facility]  Will arrange for perc G tube when/if appropriate asap

## 2015-01-29 NOTE — Discharge Summary (Signed)
Physician Discharge Summary       Patient ID: [name] MRN: [id number] DOB/AGE: 10-31-1950 64 y.o.  Admit date: 01/20/2015 Discharge date: 01/29/2015  Discharge Diagnoses:  Chronic Hypoxic / Hypercapneic Respiratory Failure  Presumed OSA / OHS Tracheostomy tube dependence S/p redo tracheostomy 5/17> no bleeding R Pneumothorax, resolved Smoker  Possible HCAP, 5/17 Hx of HTN chronic dCHF, HLD Extensive BLE DVT s/p IVC Filter placement  AKI, recovering well Scrotal mass - AFP and beta HCG normal Hypernatremia, continued 5/24 Anemia - ICU acquired + acute blood loss with tracheostomy bleeding  Fever 5/23, resolved > line related? (improved after removing HD cath) HCAP, 5/16 Hyperglycemia without prior documentation of DM: Elevated Hgb A1C (7.5 on 4/22) Rt shoulder surgery 4/22 Acute encephalopathy Post op pain Severe deconditioning  DEVICES/LINES/TUBES: ETT 4/23 >> 5/08, 5/08 >> 5/11 Trach tube 5/11 >>  RUE PICC 4/23 >>  L femoral HD cath 5/04 >>  IVC filter 5/13  Tests/studies 4/23 US scrotum: Two solid extratesticular right scrotal masses. Primary differential diagnosis for extratesticular masses include primary or metastatic neoplasms including adenomatoid tumor, fibroma, leiomyoma, mesothelioma, or sarcoma. Urology consultation is recommended  4/26 TEE: LVEF 60-65%, Grade 1 DD, trivial pericardial effusion 4/27 [place] testes with masses 5/02 renal US: no hydronephrosis, Possible nonobstructing stone lower pole left kidney  MICRO: MRSA PCR 4/22 >> NEG Urine 4.23 >> NEG resp 4/23 >> NEG Urine 4/23 >> NEG Resp 4/27 >> NEG Blood 4/27 >> NEG Resp 5/02 >> NEG   ANTIBIOTICS: Cefazolin 4/22 >> 4/23 Levofloxacin 4/23 >> 4/27 Cefepime 4/27 >> 5/05 Vanc 4/27 >> 5/01  CONSULTS Nephrology Orthopedics ENT  Detailed Hospital Course:   [name] is a 64 y.o. y/o male, smoker, with a PMH of HTN, suspected COPD (44 pk yr hx and pre-op  Hgb of 17 suggestive of hypoxia) who was admitted on 4/22 for an elective right shoulder surgery for rotator cuff repair. The patient had a difficult post-operative course beginning with difficulty extubating after surgery. He was admitted to SDU for observation. On 4/23, he developed progressive acute on chronic hypercarbic / hypoxic respiratory failure w/ increased work of breathing, accessory muscle use and somnolence, ABG obtained and ph 7/19/ PCO2 77, and PO2 of 76% on 100% FIO2, (his baseline expected PCO2 was 51.5 mmHG) this was in spite of NIPPV so he was re-intubated.His hospital course was complicated by: possible HCAP (treated w/ empiric abx and No organism identified) , decompensated diastolic HF, demand ischemia and circulatory shock for which he required pressor support.He had difficulty weaning him and initially we consulted ENT on 5/2 Trach tube, but pt deemed too critically ill @ the time to proceed. On 5/3 is renal function continued to worsen (creatinine up to 4.1 and BUN up to 165) and we consulted Nephrology. They felt acute renal failure was d/t ATN after shock. On 5/04 LE venous duplex were obtained to evaluate LE edema. This was positive for bilateral Lower extremity DVT. He was started on anticoagulation. He had on-going acute encephalopathy. He was also Transfered to [facility] for CRRT. On 5/06 Off pressors. Transitioned off CRRT to intermittent HD. 5/08 Failed extubation attempt and was re-intubated. 5/09 ENT re- consult for trach tube placement. We continued weaning efforts. He underwent Trach tube placement [facility]) on 5/11. On 5/12 he was Tolerating ATC. Continuous sedation discontinued. The evening of 5/12 he was noted to have marked trach site bleeding and heparin gtt was stopped. ENT was emergently consulted. He underwent emergent exploration of trach  site for active and ongoing hemorrhage at 0200 in the am of 5/13. Surgical findings identified: diffuse subcutaneous, muscular, and  peritracheal oozing that was all thoroughly and meticulously cauterized using the Bovie until hemostasis was noted. He returned to the ICU on full vent support w/ replaced #8 trach w/ cuff inflated. On 5/13 he went for IVC filter as well as his right ij tunneled HD catheter. He was discharged to [facility] on 5/15 with plans to continue rehab efforts.    Shortly after arrival to [facility] his trach was dislodged. He required emergent oral intubation and transfer back to [facility]. Post intubation he was noted to have significant trach site bleeding, and pneumomediastinum/Right Pneumothorax. He had been placed back on full vent support so this required serial CXRs, but resolved spontaneously. Course was notable for fever, leukocytosis and possible HCAP on 5/17 for which empiric cefepime and vanc. Cultures were negative. Vanc stopped, cefepime to continue thru 5/27. He had his trach redo on 5/17.On 5/23 he had recurrent fevers. This improved after removing HD cath. The remainder of his course was was actually one of improvement. He is currently on 24/7 trach collar. His trach site bleeding has resolved and he has tolerated UFH gtt w/out further bleeding. His renal function has improved, with biggest issue at time of discharge being hypernatremia and free water loss andHe is tolerating tube feeds. He is currently ready for transfer back to [facility] with the following plan of care per active problems below.     Discharge Plan by active problems  Chronic Hypoxic / Hypercapneic Respiratory Failure > improving, needs long term trach management Presumed OSA / OHS Tracheostomy tube dependence S/p redo tracheostomy 5/17> no bleeding R Pneumothorax, resolved Possible HCAP, 5/17 Plan:  Continue 24 hour trach collar Would only consider decannulation when he is up, walking, participating in PT Eventually will need SLP eval   Hx of HTN, chronic dCHF, HLD Extensive BLE DVT s/p IVC Filter placement  Plan:  Cont UFH gtt per  pharmacy  Start warfarin after PEG placed and no bleeding noted  Continue PRN hydralazine Bidil bid  AKI, recovering well Scrotal mass - AFP and beta HCG normal Hypernatremia, continued 5/24 Plan:  Monitor BMET and UOP Replace electrolytes as needed Increase free water to q2h  When recovers sufficiently from Dinunzio illness, consider Urology eval for scrotal mass  Critical illness protein calorie malnutrition Plan:  SUP: enteral famotidine Continue TFs @ [place] rate IR consulted for PEG, this can be done at [facility]  Cont EES as motility agent (started 5/09)   Anemia - ICU acquired + acute blood loss with tracheostomy bleeding > stable Plan:  DVT px: full dose UFH > transition to warfarin this week Monitor CBC intermittently Transfuse per usual ICU guidelines If tolerates then need to decide on possible IVC removal    Fever 5/23, resolved > line related? (improved after removing HD cath) HCAP, 5/16 Plan 5/17 cefepime >> Plan through 5/27  Hyperglycemia without prior documentation of DM Elevated Hgb A1C (7.5 on 4/22) Plan:  Cont sensitive SSI    Acute encephalopathy > persistent, due to hypernatremia? Post op pain Severe deconditioning Plan:  Minimize sedating medications PT/OT Treat underlying metabolic and infectious issues  Rt shoulder surgery 4/22 Plan: Post op mgmt per Ortho  Continue ROM of RUL   Discharge Exam: BP 118/47 mmHg  Pulse 92  Temp(Src) 100 F (37.8 C) (Core (Comment))  Resp 23  Ht  (1.803 m)  Wt 143 kg (  315 lb 4.1 oz)  BMI 43.99 kg/m2  SpO2 95%  Gen: NAD, obese, arouses to voice HENT: trach in place, ncat PULM: CTA B CV: RRR, no mgr GI: BS+, soft, nontender MSK: R arm in sling Derm: no skin breakdown Neuro: arouses to voice, making movements spontaneously   Labs at discharge Lab Results  Component Value Date   CREATININE 1.28* 01/29/2015   BUN 88* 01/29/2015   NA 154* 01/29/2015   K 3.3* 01/29/2015     CL 119* 01/29/2015   CO2 26 01/29/2015   Lab Results  Component Value Date   WBC 10.1 01/29/2015   HGB 10.3* 01/29/2015   HCT 34.1* 01/29/2015   MCV 101.5* 01/29/2015   PLT 155 01/29/2015   Lab Results  Component Value Date   ALT 36 01/24/2015   AST 57* 01/24/2015   ALKPHOS 91 01/24/2015   BILITOT 1.4* 01/24/2015   Lab Results  Component Value Date   INR 1.13 01/29/2015   INR 1.22 01/28/2015   INR 1.07 01/09/2015    [facility] radiology studies Ct Abdomen Wo Contrast  01/28/2015   CLINICAL DATA:  Anticipated percutaneous G-tube placement. Preprocedural evaluation. Initial encounter.  EXAM: CT ABDOMEN WITHOUT CONTRAST  TECHNIQUE: Multidetector CT imaging of the abdomen was performed following the standard protocol without IV contrast.  COMPARISON:  Abdominal radiograph 01/19/2015.  FINDINGS: Lower chest: Evaluation of the lung bases is mildly degraded by breathing artifact. There is trace pleural fluid and basilar atelectasis bilaterally. There is a small amount of gas behind the lower sternum, seen on image 1. There is no generalized pneumomediastinum or pneumothorax. The heart is enlarged.  Hepatobiliary: The liver demonstrates decreased density consistent with steatosis. No focal lesions are apparent on noncontrast imaging. No evidence of gallstones, gallbladder wall thickening or biliary dilatation.  Pancreas: Unremarkable. No pancreatic ductal dilatation or surrounding inflammatory changes.  Spleen: Normal in size without focal abnormality.  Adrenals/Urinary Tract: Both adrenal glands appear normal.There are bilateral renal calculi, larger and more numerous on the right. There is a 6 mm calculus within the mid right kidney on image 40. There is a 12 mm calculus in the interpolar region of the left kidney on image 43. The left collecting system has a UPJ configuration. There is no asymmetric perinephric soft tissue stranding or proximal ureteral calculus. There is a probable 11 mm  exophytic left renal cyst. The pelvis was not imaged.  Stomach/Bowel: Nasogastric tube extends into the distal stomach. There is a proximal duodenal diverticulum. There is no bowel wall thickening or significant distention. There is no significant extension of the transverse colon anterior to the stomach. The appendix appears normal.  Vascular/Lymphatic: There are no enlarged abdominal lymph nodes. There are small lymph nodes within the porta hepatis. IVC filter is noted. There is aortoiliac atherosclerosis.  Other: Small umbilical hernia containing only fat.  Musculoskeletal: No acute or significant osseous findings.  IMPRESSION: 1. No significant colonic interposition between the anterior abdominal wall and stomach. 2. Hepatic steatosis. 3. Bilateral nephrolithiasis. Possible left UPJ stenosis without secondary signs of ureteral obstruction. 4. Cardiomegaly, bibasilar atelectasis and a small amount of retrosternal air, likely related to previous thoracic intervention.   Electronically Signed   By: Carey Bullocks M.D.   On: 01/28/2015 17:26   Ct Head Wo Contrast  01/28/2015   CLINICAL DATA:  Acute encephalopathy. Unable to follow commands. History of acute on chronic respiratory failure. Initial encounter.  EXAM: CT HEAD WITHOUT CONTRAST  TECHNIQUE: Contiguous axial images were  obtained from the base of the skull through the vertex without intravenous contrast.  COMPARISON:  None.  FINDINGS: There is no evidence of acute intracranial hemorrhage, mass lesion, brain edema or extra-axial fluid collection. The ventricles and subarachnoid spaces are mildly prominent for age. There is mild periventricular low-density most consistent with small vessel ischemic change. No evidence of cortical infarct.  Nasogastric tube is in place. There is mild mucosal thickening throughout the paranasal sinuses without air-fluid levels. There are bilateral mastoid effusions without definite coalescence. There is opacification of the  left middle ear. The right middle ear appears clear. The calvarium is intact.  IMPRESSION: 1. No acute intracranial findings demonstrated. Mild atrophy and periventricular white matter disease. 2. Bilateral mastoid effusions with opacification of the left middle ear.   Electronically Signed   By: Carey BullocksWilliam  Veazey M.D.   On: 01/28/2015 17:10    Disposition:  04-Intermediate Care Facility      Discharge Instructions    Increase activity slowly    Complete by:  As directed             Medication List    STOP taking these medications        ibuprofen 200 MG tablet  Commonly known as:  ADVIL,MOTRIN     oxyCODONE-acetaminophen 5-325 MG per tablet  Commonly known as:  ROXICET      TAKE these medications        acetaminophen 160 MG/5ML solution  Commonly known as:  TYLENOL  Place 20.3 mLs (650 mg total) into feeding tube every 6 (six) hours as needed for mild pain, headache or fever.     antiseptic oral rinse 0.05 % Liqd solution  Commonly known as:  CPC / CETYLPYRIDINIUM CHLORIDE 0.05%  7 mLs by Mouth Rinse route 2 times daily at 12 noon and 4 pm.     ceFEPIme 2 g in dextrose 5 % 50 mL  Inject 2 g into the vein every 12 (twelve) hours.     chlorhexidine 0.12 % solution  Commonly known as:  PERIDEX  15 mLs by Mouth Rinse route 2 (two) times daily.     dextrose 5 % with KCl 20 mEq / L 20-5 MEQ/L-%  Inject 1,000 mLs (20 mEq total) into the vein continuous.     erythromycin 400 MG/5ML suspension  Commonly known as:  EES  Place 5 mLs (400 mg total) into feeding tube every 8 (eight) hours.     famotidine 40 MG/5ML suspension  Commonly known as:  PEPCID  Place 2.5 mLs (20 mg total) into feeding tube daily.     feeding supplement (NEPRO CARB STEADY) Liqd  Place 1,000 mLs into feeding tube daily.     feeding supplement (PRO-STAT SUGAR FREE 64) Liqd  Place 30 mLs into feeding tube 3 (three) times daily.     fentaNYL 100 MCG/2ML injection  Commonly known as:  SUBLIMAZE    Inject 0.5-2 mLs (25-100 mcg total) into the vein every 2 (two) hours as needed for severe pain.     folic acid 1 MG tablet  Commonly known as:  FOLVITE  Take 1 tablet (1 mg total) by mouth daily.     free water Soln  Place 300 mLs into feeding tube every 2 (two) hours.     heparin 100-0.45 UNIT/ML-% infusion  Inject 1,450 Units/hr into the vein continuous.     hydrALAZINE 20 MG/ML injection  Commonly known as:  APRESOLINE  Inject 0.5-2 mLs (10-40 mg total) into the  vein every 4 (four) hours as needed (To maintain SBP < 170 mmHg).     insulin aspart 100 UNIT/ML injection  Commonly known as:  novoLOG  Inject 0-9 Units into the skin every 4 (four) hours.     isosorbide-hydrALAZINE 20-37.5 MG per tablet  Commonly known as:  BIDIL  Take 1 tablet by mouth 2 (two) times daily.     LORazepam 2 MG/ML injection  Commonly known as:  ATIVAN  Inject 0.25-0.5 mLs (0.5-1 mg total) into the vein every 4 (four) hours as needed for anxiety.     ondansetron 4 MG/2ML Soln injection  Commonly known as:  ZOFRAN  Inject 2 mLs (4 mg total) into the vein every 6 (six) hours as needed for nausea.     potassium chloride 20 MEQ/15ML (10%) Soln  Place 30 mLs (40 mEq total) into feeding tube once.     sodium chloride 0.9 % infusion  Inject 250 mLs into the vein as needed (if IV carrier fluid needed.).         Discharged Condition: good  Physician Statement:   The Patient was personally examined, the discharge assessment and plan has been personally reviewed and I agree with ACNP Idamae Coccia's assessment and plan. > 30 minutes of time have been dedicated to discharge assessment, planning and discharge instructions.   Signed: Astraea Gaughran,PETE 01/29/2015, 12:09 PM

## 2015-01-29 NOTE — Progress Notes (Signed)
[facility] ROUNDING NOTE   Subjective:   Interval History:  No issues today  Objective:  Vital signs in last 24 hours:  Temp:  [100 F (37.8 C)-100.8 F (38.2 C)] 100 F (37.8 C) (05/24 1125) Pulse Rate:  [75-101] 92 (05/24 1125) Resp:  [12-27] 23 (05/24 1125) BP: (107-168)/(47-78) 118/47 mmHg (05/24 1100) SpO2:  [93 %-100 %] 95 % (05/24 1125) FiO2 (%):  [28 %] 28 % (05/24 1125) Weight:  [143 kg (315 lb 4.1 oz)] 143 kg (315 lb 4.1 oz) (05/24 0500)  Weight change: 1 kg (2 lb 3.3 oz) Filed Weights   01/26/15 0900 01/28/15 0401 01/29/15 0500  Weight: 143 kg (315 lb 4.1 oz) 142 kg (313 lb 0.9 oz) 143 kg (315 lb 4.1 oz)    Intake/Output: I/O last 3 completed shifts: In: 4619.9 [I.V.:2699.9; NG/GT:1820; IV Piggyback:100] Out: 5100 [Urine:5100]   Intake/Output this shift:  Total I/O In: 725 [I.V.:375; NG/GT:300; IV Piggyback:50] Out: 850 [Urine:850]  CVS- RRR RS- CTA trach collar ABD- BS present soft non-distended EXT- no edema   Basic Metabolic Panel:  Recent Labs Lab 01/22/15 2200  01/23/15 1028 01/23/15 2220 01/24/15 0342 01/25/15 0248 01/26/15 0232 01/27/15 0322 01/28/15 0534 01/29/15 0305  NA  --   < >  --   --  146* 148* 152* 152* 154* 154*  K  --   < >  --   --  3.7 3.7 3.7 3.3* 3.6 3.3*  CL  --   < >  --   --  112* 114* 115* 115* 120* 119*  CO2  --   < >  --   --  GLUCOSE  --   < >  --   --  130* 156* 151* 144* 162* 141*  BUN  --   < >  --   --  129* 128* 128* 118* 99* 88*  CREATININE  --   < >  --   --  2.71* 2.16* 1.75* 1.60* 1.44* 1.28*  CALCIUM  --   < >  --   --  8.0* 9.0 8.8* 9.3 9.4 9.7  MG 2.3  --  2.4 2.3  --   --   --   --   --   --   PHOS  --   --   --   --  6.6* 5.6* 5.5* 3.5 3.0  --   < > = values in this interval not displayed.  Liver Function Tests:  Recent Labs Lab 01/24/15 0342 01/25/15 0248 01/26/15 0232 01/27/15 0322 01/28/15 0534  AST 57*  --   --   --   --   ALT 36  --   --   --   --    ALKPHOS 91  --   --   --   --   BILITOT 1.4*  --   --   --   --   PROT 6.1*  --   --   --   --   ALBUMIN 2.5* 2.5* 2.5* 2.7* 2.7*   No results for input(s): LIPASE, AMYLASE in the last 168 hours. No results for input(s): AMMONIA in the last 168 hours.  CBC:  Recent Labs Lab 01/23/15 0430  01/25/15 0248 01/26/15 0233 01/27/15 0322 01/28/15 0534 01/29/15 0305  WBC 14.1*  < > 11.6* 12.6* 11.9* 10.1 10.1  NEUTROABS 10.5*  --   --   --   --   --   --  HGB 10.5*  < > 10.8* 10.8* 10.7* 10.6* 10.3*  HCT 33.7*  < > 35.5* 35.6* 35.3* 35.3* 34.1*  MCV 98.3  < > 99.4 101.7* 99.7 101.7* 101.5*  PLT 149*  < > 138* 132* 152 153 155  < > = values in this interval not displayed.  Cardiac Enzymes: No results for input(s): CKTOTAL, CKMB, CKMBINDEX, TROPONINI in the last 168 hours.  BNP: Invalid input(s): POCBNP  CBG:  Recent Labs Lab 01/28/15 1245 01/28/15 2011 01/29/15 0024 01/29/15 0413 01/29/15 0944  GLUCAP 128* 144* 123* 131* 128*    Microbiology: Results for orders placed or performed during the hospital encounter of 01/20/15  Culture, blood (routine x 2)     Status: None   Collection Time: 01/21/15 11:42 AM  Result Value Ref Range Status   Specimen Description BLOOD LEFT HAND  Final   Special Requests BOTTLES DRAWN AEROBIC ONLY 1.5CC  Final   Culture   Final    STAPHYLOCOCCUS SPECIES (COAGULASE NEGATIVE) Note: THE SIGNIFICANCE OF ISOLATING THIS ORGANISM FROM A SINGLE SET OF BLOOD CULTURES WHEN MULTIPLE SETS ARE DRAWN IS UNCERTAIN. PLEASE NOTIFY THE MICROBIOLOGY DEPARTMENT WITHIN ONE WEEK IF SPECIATION AND SENSITIVITIES ARE REQUIRED. Note: Culture results may be compromised due to an inadequate volume of blood received in culture bottles. Gram Stain Report Called to,Read Back By and Verified With: JENNIFER CLIFTON @[id number]  01/22/15 Performed at [facility]    Report Status 01/24/2015 FINAL  Final  Culture, blood (routine x 2)     Status: None   Collection Time:  01/21/15 11:49 AM  Result Value Ref Range Status   Specimen Description BLOOD PICC LINE  Final   Special Requests BOTTLES DRAWN AEROBIC AND ANAEROBIC  8CC EACH  Final   Culture   Final    NO GROWTH 5 DAYS Performed at [facility]    Report Status 01/27/2015 FINAL  Final  Culture, respiratory (NON-Expectorated)     Status: None   Collection Time: 01/21/15 12:47 PM  Result Value Ref Range Status   Specimen Description TRACHEAL ASPIRATE  Final   Special Requests Normal  Final   Gram Stain   Final    RARE WBC PRESENT, PREDOMINANTLY PMN NO SQUAMOUS EPITHELIAL CELLS SEEN NO ORGANISMS SEEN Performed at [facility]    Culture   Final    FEW CANDIDA ALBICANS Performed at [facility]    Report Status 01/23/2015 FINAL  Final  Wound culture     Status: None   Collection Time: 01/24/15 10:04 PM  Result Value Ref Range Status   Specimen Description WOUND  Final   Special Requests PENILE DRAINAGE  Final   Gram Stain   Final    FEW WBC PRESENT,BOTH PMN AND MONONUCLEAR FEW SQUAMOUS EPITHELIAL CELLS PRESENT NO ORGANISMS SEEN Performed at [facility]    Culture   Final    FEW STAPHYLOCOCCUS SPECIES (COAGULASE NEGATIVE) RARE CANDIDA ALBICANS Performed at [facility]    Report Status 01/27/2015 FINAL  Final  Culture, blood (routine x 2)     Status: None (Preliminary result)   Collection Time: 01/28/15  1:05 PM  Result Value Ref Range Status   Specimen Description BLOOD LEFT HAND  Final   Special Requests BOTTLES DRAWN AEROBIC AND ANAEROBIC 5CC  Final   Culture   Final           BLOOD CULTURE RECEIVED NO GROWTH TO DATE CULTURE WILL BE HELD FOR 5 DAYS BEFORE ISSUING A  FINAL NEGATIVE REPORT Performed at [facility]    Report Status PENDING  Incomplete  Culture, blood (routine x 2)     Status: None (Preliminary result)   Collection Time: 01/28/15  1:18 PM  Result Value Ref Range Status   Specimen Description BLOOD LEFT HAND  Final    Special Requests BOTTLES DRAWN AEROBIC ONLY 5CC  Final   Culture   Final           BLOOD CULTURE RECEIVED NO GROWTH TO DATE CULTURE WILL BE HELD FOR 5 DAYS BEFORE ISSUING A FINAL NEGATIVE REPORT Performed at [facility]    Report Status PENDING  Incomplete  Culture, respiratory (NON-Expectorated)     Status: None (Preliminary result)   Collection Time: 01/28/15  3:13 PM  Result Value Ref Range Status   Specimen Description TRACHEAL ASPIRATE  Final   Special Requests NONE  Final   Gram Stain   Final    FEW WBC PRESENT,BOTH PMN AND MONONUCLEAR RARE SQUAMOUS EPITHELIAL CELLS PRESENT NO ORGANISMS SEEN Performed at [facility]    Culture   Final    NO GROWTH 1 DAY Performed at [facility]    Report Status PENDING  Incomplete    Coagulation Studies:  Recent Labs  01/28/15 2009 01/29/15 0305  LABPROT 15.6* 14.7  INR 1.22 1.13    Urinalysis: No results for input(s): COLORURINE, LABSPEC, PHURINE, GLUCOSEU, HGBUR, BILIRUBINUR, KETONESUR, PROTEINUR, UROBILINOGEN, NITRITE, LEUKOCYTESUR in the last 72 hours.  Invalid input(s): APPERANCEUR    Imaging: Ct Abdomen Wo Contrast  01/28/2015   CLINICAL DATA:  Anticipated percutaneous G-tube placement. Preprocedural evaluation. Initial encounter.  EXAM: CT ABDOMEN WITHOUT CONTRAST  TECHNIQUE: Multidetector CT imaging of the abdomen was performed following the standard protocol without IV contrast.  COMPARISON:  Abdominal radiograph 01/19/2015.  FINDINGS: Lower chest: Evaluation of the lung bases is mildly degraded by breathing artifact. There is trace pleural fluid and basilar atelectasis bilaterally. There is a small amount of gas behind the lower sternum, seen on image 1. There is no generalized pneumomediastinum or pneumothorax. The heart is enlarged.  Hepatobiliary: The liver demonstrates decreased density consistent with steatosis. No focal lesions are apparent on noncontrast imaging. No evidence of gallstones,  gallbladder wall thickening or biliary dilatation.  Pancreas: Unremarkable. No pancreatic ductal dilatation or surrounding inflammatory changes.  Spleen: Normal in size without focal abnormality.  Adrenals/Urinary Tract: Both adrenal glands appear normal.There are bilateral renal calculi, larger and more numerous on the right. There is a 6 mm calculus within the mid right kidney on image 40. There is a 12 mm calculus in the interpolar region of the left kidney on image 43. The left collecting system has a UPJ configuration. There is no asymmetric perinephric soft tissue stranding or proximal ureteral calculus. There is a probable 11 mm exophytic left renal cyst. The pelvis was not imaged.  Stomach/Bowel: Nasogastric tube extends into the distal stomach. There is a proximal duodenal diverticulum. There is no bowel wall thickening or significant distention. There is no significant extension of the transverse colon anterior to the stomach. The appendix appears normal.  Vascular/Lymphatic: There are no enlarged abdominal lymph nodes. There are small lymph nodes within the porta hepatis. IVC filter is noted. There is aortoiliac atherosclerosis.  Other: Small umbilical hernia containing only fat.  Musculoskeletal: No acute or significant osseous findings.  IMPRESSION: 1. No significant colonic interposition between the anterior abdominal wall and stomach. 2. Hepatic steatosis. 3. Bilateral nephrolithiasis. 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 4. Cardiomegaly, bibasilar atelectasis and a small amount of retrosternal air, likely related to previous thoracic intervention.   Electronically Signed   By: Carey Bullocks M.D.   On: 01/28/2015 17:26   Ct Head Wo Contrast  01/28/2015   CLINICAL DATA:  Acute encephalopathy. Unable to follow commands. History of acute on chronic respiratory failure. Initial encounter.  EXAM: CT HEAD WITHOUT CONTRAST  TECHNIQUE: Contiguous axial images were  obtained from the base of the skull through the vertex without intravenous contrast.  COMPARISON:  None.  FINDINGS: There is no evidence of acute intracranial hemorrhage, mass lesion, brain edema or extra-axial fluid collection. The ventricles and subarachnoid spaces are mildly prominent for age. There is mild periventricular low-density most consistent with small vessel ischemic change. No evidence of cortical infarct.  Nasogastric tube is in place. There is mild mucosal thickening throughout the paranasal sinuses without air-fluid levels. There are bilateral mastoid effusions without definite coalescence. There is opacification of the left middle ear. The right middle ear appears clear. The calvarium is intact.  IMPRESSION: 1. No acute intracranial findings demonstrated. Mild atrophy and periventricular white matter disease. 2. Bilateral mastoid effusions with opacification of the left middle ear.   Electronically Signed   By: Carey Bullocks M.D.   On: 01/28/2015 17:10     Medications:   . dextrose 5 % with KCl 20 mEq / L 20 mEq (01/29/15 1018)  . heparin 1,450 Units/hr (01/29/15 0600)   . antiseptic oral rinse  7 mL Mouth Rinse q12n4p  . ceFEPime (MAXIPIME) IV  2 g Intravenous Q12H  . chlorhexidine  15 mL Mouth Rinse BID  . erythromycin  400 mg Per Tube 3 times per day  . famotidine  20 mg Per Tube Daily  . feeding supplement (NEPRO CARB STEADY)  1,000 mL Per Tube Q24H  . feeding supplement (PRO-STAT SUGAR FREE 64)  30 mL Per Tube TID  . folic acid  1 mg Oral Daily  . free water  300 mL Per Tube Q2H  . heparin  40 Units/kg Dialysis Once in dialysis  . insulin aspart  0-9 Units Subcutaneous 6 times per day  . isosorbide-hydrALAZINE  1 tablet Oral BID  . potassium chloride  40 mEq Per Tube Once   sodium chloride, sodium chloride, acetaminophen, feeding supplement (NEPRO CARB STEADY), fentaNYL, heparin, hydrALAZINE, lidocaine (PF), lidocaine-prilocaine, lip balm, LORazepam, ondansetron  (ZOFRAN) IV, pentafluoroprop-tetrafluoroeth  Assessment/ Plan:   Hypernatremia would continue IV D5 W to replace the free water deficit  ARF secondary to ATN  Hypokalemia repleted   LOS: 9 [name] W @TODAY @12 :28 PM

## 2015-01-30 ENCOUNTER — Encounter: Payer: Self-pay | Admitting: Physician Assistant

## 2015-01-30 LAB — CULTURE, RESPIRATORY: Culture: NO GROWTH

## 2015-01-30 LAB — PHOSPHORUS: Phosphorus: 3.6 mg/dL (ref 2.5–4.6)

## 2015-01-30 LAB — COMPREHENSIVE METABOLIC PANEL
ALBUMIN: 2.8 g/dL — AB (ref 3.5–5.0)
ALT: 49 U/L (ref 17–63)
AST: 45 U/L — AB (ref 15–41)
Alkaline Phosphatase: 113 U/L (ref 38–126)
Anion gap: 8 (ref 5–15)
BUN: 71 mg/dL — ABNORMAL HIGH (ref 6–20)
CO2: 26 mmol/L (ref 22–32)
Calcium: 9.7 mg/dL (ref 8.9–10.3)
Chloride: 118 mmol/L — ABNORMAL HIGH (ref 101–111)
Creatinine, Ser: 1.14 mg/dL (ref 0.61–1.24)
GFR calc Af Amer: >60 mL/min (ref >60)
GFR calc non Af Amer: >60 mL/min (ref >60)
Glucose, Bld: 161 mg/dL — ABNORMAL HIGH (ref 65–99)
Potassium: 3.6 mmol/L (ref 3.5–5.1)
Sodium: 152 mmol/L — ABNORMAL HIGH (ref 135–145)
Total Bilirubin: 0.8 mg/dL (ref 0.3–1.2)
Total Protein: 6.8 g/dL (ref 6.5–8.1)

## 2015-01-30 LAB — HEPARIN LEVEL (UNFRACTIONATED)
HEPARIN UNFRACTIONATED: 0.23 [IU]/mL — AB (ref 0.30–0.70)
Heparin Unfractionated: 0.38 IU/mL (ref 0.30–0.70)

## 2015-01-30 LAB — CBC
HCT: 36.2 % — ABNORMAL LOW (ref 39.0–52.0)
Hemoglobin: 10.7 g/dL — ABNORMAL LOW (ref 13.0–17.0)
MCH: 30.2 pg (ref 26.0–34.0)
MCHC: 29.6 g/dL — ABNORMAL LOW (ref 30.0–36.0)
MCV: 102.3 fL — ABNORMAL HIGH (ref 78.0–100.0)
Platelets: 169 10*3/uL (ref 150–400)
RBC: 3.54 MIL/uL — ABNORMAL LOW (ref 4.22–5.81)
RDW: 16.8 % — ABNORMAL HIGH (ref 11.5–15.5)
WBC: 9.5 10*3/uL (ref 4.0–10.5)

## 2015-01-30 LAB — CULTURE, RESPIRATORY W GRAM STAIN

## 2015-01-30 LAB — MAGNESIUM: MAGNESIUM: 1.7 mg/dL (ref 1.7–2.4)

## 2015-01-30 NOTE — H&P (Signed)
Chief Complaint: Respiratory Failure Encephalopathy Malnutrition Need for long term care  Referring Physician(s): Dr. Kendrick Fries  History of Present Illness: [name] is a 64 y.o. male who had a long complicated hospital course after elective rotator cuff repair.  He developed respiratory failure shortly after surgery. He also developed renal failure and encephalopathy.  He now has a tracheostomy  He is currently admitted to [facility]  He getting Tube feeds via Dobhoff tube and we are asked to place a perc G-Tube  He has been afebrile for 24 hours.  Past Medical History  Diagnosis Date  . Arthritis   . RCT (rotator cuff tear)   . Multiple abrasions     rt arm  . Morbid obesity     Past Surgical History  Procedure Laterality Date  . No past surgeries    . Shoulder arthroscopy with subacromial decompression, rotator cuff repair and bicep tendon repair Right 12/28/2014    Procedure: RIGHT SHOULDER ARTHROSCOPY WITH SUBACROMIAL DECOMPRESSION, DISTAL CLAVICLE RESECTION, ROTATOR CUFF REPAIR ;  Surgeon: Eugenia Mcalpine, MD;  Location: [facility] ORS;  Service: Orthopedics;  Laterality: Right;  . Tracheostomy tube placement N/A 01/16/2015    Procedure: TRACHEOSTOMY;  Surgeon: Flo Shanks, MD;  Location: [facility] OR;  Service: ENT;  Laterality: N/A;  . Tracheostomy revision N/A 01/18/2015    Procedure: TRACHEOSTOMY REVISION;  Surgeon: Melvenia Beam, MD;  Location: [facility] OR;  Service: ENT;  Laterality: N/A;  . Tracheostomy tube placement N/A 01/22/2015    Procedure: TRACHEOSTOMY;  Surgeon: Flo Shanks, MD;  Location: [facility] OR;  Service: ENT;  Laterality: N/A;    Allergies: Review of patient's allergies indicates no known allergies.  Medications: Prior to Admission medications   Medication Sig Start Date End Date Taking? Authorizing Provider  acetaminophen (TYLENOL) 160 MG/5ML solution Place 20.3 mLs (650 mg total) into feeding tube every 6 (six) hours as needed for mild  pain, headache or fever. 01/29/15   Simonne Martinet, NP  Amino Acids-Protein Hydrolys (FEEDING SUPPLEMENT, PRO-STAT SUGAR FREE 64,) LIQD Place 30 mLs into feeding tube 3 (three) times daily. 01/29/15   Simonne Martinet, NP  antiseptic oral rinse (CPC / CETYLPYRIDINIUM CHLORIDE 0.05%) 0.05 % LIQD solution 7 mLs by Mouth Rinse route 2 times daily at 12 noon and 4 pm. 01/29/15   Simonne Martinet, NP  ceFEPIme 2 g in dextrose 5 % 50 mL Inject 2 g into the vein every 12 (twelve) hours. 01/29/15   Simonne Martinet, NP  chlorhexidine (PERIDEX) 0.12 % solution 15 mLs by Mouth Rinse route 2 (two) times daily. 01/29/15   Simonne Martinet, NP  erythromycin (EES) 400 MG/5ML suspension Place 5 mLs (400 mg total) into feeding tube every 8 (eight) hours. 01/29/15   Simonne Martinet, NP  famotidine (PEPCID) 40 MG/5ML suspension Place 2.5 mLs (20 mg total) into feeding tube daily. 01/29/15   Simonne Martinet, NP  fentaNYL (SUBLIMAZE) 100 MCG/2ML injection Inject 0.5-2 mLs (25-100 mcg total) into the vein every 2 (two) hours as needed for severe pain. 01/29/15   Simonne Martinet, NP  folic acid (FOLVITE) 1 MG tablet Take 1 tablet (1 mg total) by mouth daily. 01/29/15   Simonne Martinet, NP  heparin 100-0.45 UNIT/ML-% infusion Inject 1,450 Units/hr into the vein continuous. 01/29/15   Simonne Martinet, NP  hydrALAZINE (APRESOLINE) 20 MG/ML injection Inject 0.5-2 mLs (10-40 mg total) into the vein every 4 (four) hours as needed (To maintain SBP <  170 mmHg). 01/29/15   Simonne MartinetPeter E Babcock, NP  insulin aspart (NOVOLOG) 100 UNIT/ML injection Inject 0-9 Units into the skin every 4 (four) hours. 01/29/15   Simonne MartinetPeter E Babcock, NP  isosorbide-hydrALAZINE (BIDIL) 20-37.5 MG per tablet Take 1 tablet by mouth 2 (two) times daily. 01/29/15   Simonne MartinetPeter E Babcock, NP  LORazepam (ATIVAN) 2 MG/ML injection Inject 0.25-0.5 mLs (0.5-1 mg total) into the vein every 4 (four) hours as needed for anxiety. 01/29/15   Simonne MartinetPeter E Babcock, NP  Nutritional Supplements (FEEDING  SUPPLEMENT, NEPRO CARB STEADY,) LIQD Place 1,000 mLs into feeding tube daily. 01/29/15   Simonne MartinetPeter E Babcock, NP  ondansetron [facility](ZOFRAN) 4 MG/2ML SOLN injection Inject 2 mLs (4 mg total) into the vein every 6 (six) hours as needed for nausea. 01/29/15   Simonne MartinetPeter E Babcock, NP  potassium chloride 20 MEQ/15ML (10%) SOLN Place 30 mLs (40 mEq total) into feeding tube once. 01/29/15   Simonne MartinetPeter E Babcock, NP  Potassium Chloride in Dextrose (DEXTROSE 5 % WITH KCL 20 MEQ / L) 20-5 MEQ/L-% Inject 1,000 mLs (20 mEq total) into the vein continuous. 01/29/15   Simonne MartinetPeter E Babcock, NP  sodium chloride 0.9 % infusion Inject 250 mLs into the vein as needed (if IV carrier fluid needed.). 01/29/15   Simonne MartinetPeter E Babcock, NP  Water For Irrigation, Sterile (FREE WATER) SOLN Place 300 mLs into feeding tube every 2 (two) hours. 01/29/15   Simonne MartinetPeter E Babcock, NP     Family History  Problem Relation Age of Onset  . Heart disease Mother   . Heart attack Mother   . Heart disease Brother     History   Social History  . Marital Status: Single    Spouse Name: N/A  . Number of Children: N/A  . Years of Education: N/A   Social History Main Topics  . Smoking status: [place] Every Day Smoker  . Smokeless tobacco: Not on file  . Alcohol Use: No  . Drug Use: No  . Sexual Activity: Not on file   Other Topics Concern  . None   Social History Narrative     Review of Systems: A 12 point ROS discussed and pertinent positives are indicated in the HPI above.  All other systems are negative.  Review of Systems  Unable to perform ROS: Patient nonverbal  and tracheostomy in place.  Vital Signs: Currently Afebrile.   Physical Exam  Constitutional:  Morbidly Obese, NAD  HENT:  Head: Normocephalic.  Eyes: EOM are normal.  Neck:  Obese,  Tracheostomy in place  Cardiovascular: Normal rate and regular rhythm.   Pulmonary/Chest:  Trach in place.  + Rhonchi bilaterally  Abdominal: Soft.  Obese  Neurological:  Awake, but unable to  determing Alertness and orientation  Vitals reviewed.    Mallampati Score:  MD Evaluation Airway: Other (comments) Airway comments: Tracheostomy Heart: WNL Abdomen: WNL Chest/ Lungs: Other (comments) Chest/ lungs comments: rhonchi bilaterally Other Pertinent Findings: Mallampati Score not done secondary to patient has trachestomy ASA  Classification: 3  Imaging:  Labs:  CBC:  Recent Labs  01/27/15 0322 01/28/15 0534 01/29/15 0305 01/30/15 0655  WBC 11.9* 10.1 10.1 9.5  HGB 10.7* 10.6* 10.3* 10.7*  HCT 35.3* 35.3* 34.1* 36.2*  PLT 152 153 155 169    COAGS:  Recent Labs  01/09/15 1510 01/28/15 2009 01/29/15 0305  INR 1.07 1.22 1.13  APTT 25 55*  --     BMP:  Recent Labs  01/27/15 0322 01/28/15 0534 01/29/15 0305  01/30/15 0655  NA 152* 154* 154* 152*  K 3.3* 3.6 3.3* 3.6  CL 115* 120* 119* 118*  CO2 GLUCOSE 144* 162* 141* 161*  BUN 118* 99* 88* 71*  CALCIUM 9.3 9.4 9.7 9.7  CREATININE 1.60* 1.44* 1.28* 1.14  GFRNONAA 45* 51* 59* >60  GFRAA 52* 59* >60 >60    LIVER FUNCTION TESTS:  Recent Labs  12/30/14 0548 01/02/15 0430  01/24/15 0342  01/26/15 0232 01/27/15 0322 01/28/15 0534 01/30/15 0655  BILITOT 1.1 1.9*  --  1.4*  --   --   --   --  0.8  AST 50* 45*  --  57*  --   --   --   --  45*  ALT 34 29  --  36  --   --   --   --  49  ALKPHOS 69 80  --  91  --   --   --   --  113  PROT 6.4 7.2  --  6.1*  --   --   --   --  6.8  ALBUMIN 3.1* 3.3*  < > 2.5*  < > 2.5* 2.7* 2.7* 2.8*  < > = values in this interval not displayed.  TUMOR MARKERS:  Recent Labs  01/10/15 0930  AFPTM 2.7    Assessment and Plan:  Respiratory Failure with Trach in place [place] tube feeds via Dobbhoff Need for Perc G-Tube placement Will need to obtain consent from family.  Thank you for this interesting consult.  I greatly enjoyed meeting Monico Battin and look forward to participating in their care.  SignedGolden Pop 01/30/2015,  1:50 PM   I spent a total of 20 Minutes    in face to face in clinical consultation, greater than 50% of which was counseling/coordinating care for perc G Tube

## 2015-01-31 ENCOUNTER — Other Ambulatory Visit (HOSPITAL_COMMUNITY): Payer: Self-pay

## 2015-01-31 LAB — BASIC METABOLIC PANEL
Anion gap: 9 (ref 5–15)
BUN: 61 mg/dL — ABNORMAL HIGH (ref 6–20)
CHLORIDE: 116 mmol/L — AB (ref 101–111)
CO2: 28 mmol/L (ref 22–32)
Calcium: 10 mg/dL (ref 8.9–10.3)
Creatinine, Ser: 1.03 mg/dL (ref 0.61–1.24)
GFR calc Af Amer: >60 mL/min (ref >60)
GFR calc non Af Amer: >60 mL/min (ref >60)
Glucose, Bld: 126 mg/dL — ABNORMAL HIGH (ref 65–99)
POTASSIUM: 3.3 mmol/L — AB (ref 3.5–5.1)
Sodium: 153 mmol/L — ABNORMAL HIGH (ref 135–145)

## 2015-01-31 LAB — HEPARIN LEVEL (UNFRACTIONATED): Heparin Unfractionated: 0.31 IU/mL (ref 0.30–0.70)

## 2015-01-31 LAB — VITAMIN B12: VITAMIN B 12: 304 pg/mL (ref 180–914)

## 2015-01-31 MED ORDER — CEFAZOLIN SODIUM 1-5 GM-% IV SOLN
INTRAVENOUS | Status: AC
  Filled 2015-01-31: qty 50

## 2015-01-31 MED ORDER — DEXTROSE 5 % IV SOLN
3.0000 g | Freq: Once | INTRAVENOUS | Status: AC
  Administered 2015-01-31: 3 g via INTRAVENOUS

## 2015-01-31 MED ORDER — MIDAZOLAM HCL 2 MG/2ML IJ SOLN
INTRAMUSCULAR | Status: AC | PRN
  Administered 2015-01-31: 1 mg via INTRAVENOUS

## 2015-01-31 MED ORDER — GLUCAGON HCL RDNA (DIAGNOSTIC) 1 MG IJ SOLR
INTRAMUSCULAR | Status: AC
  Filled 2015-01-31: qty 1

## 2015-01-31 MED ORDER — LIDOCAINE HCL 1 % IJ SOLN
INTRAMUSCULAR | Status: AC
  Filled 2015-01-31: qty 20

## 2015-01-31 MED ORDER — MIDAZOLAM HCL 2 MG/2ML IJ SOLN
INTRAMUSCULAR | Status: AC
  Filled 2015-01-31: qty 4

## 2015-01-31 MED ORDER — CEFAZOLIN SODIUM-DEXTROSE 2-3 GM-% IV SOLR
INTRAVENOUS | Status: AC
  Filled 2015-01-31: qty 50

## 2015-01-31 MED ORDER — IOHEXOL 300 MG/ML  SOLN
50.0000 mL | Freq: Once | INTRAMUSCULAR | Status: AC | PRN
  Administered 2015-01-31: 15 mL

## 2015-01-31 MED ORDER — GLUCAGON HCL (RDNA) 1 MG IJ SOLR
INTRAMUSCULAR | Status: AC | PRN
  Administered 2015-01-31: 1 mg via INTRAVENOUS

## 2015-01-31 MED ORDER — FENTANYL CITRATE (PF) 100 MCG/2ML IJ SOLN
INTRAMUSCULAR | Status: AC
  Filled 2015-01-31: qty 4

## 2015-01-31 NOTE — Procedures (Signed)
Interventional Radiology Procedure Note  Procedure: Placement of percutaneous 20F pull-through gastrostomy tube. Complications: None Recommendations: - NPO except for sips and chips remainder of today and overnight - Maintain G-tube to LWS until tomorrow morning  - May advance diet as tolerated and begin using tube tomorrow morning  Signed,  Lama Narayanan K. Jelisha Weed, MD   

## 2015-02-01 DIAGNOSIS — Z4659 Encounter for fitting and adjustment of other gastrointestinal appliance and device: Secondary | ICD-10-CM | POA: Insufficient documentation

## 2015-02-01 LAB — BASIC METABOLIC PANEL
Anion gap: 10 (ref 5–15)
BUN: 43 mg/dL — ABNORMAL HIGH (ref 6–20)
CO2: 25 mmol/L (ref 22–32)
CREATININE: 0.98 mg/dL (ref 0.61–1.24)
Calcium: 9.4 mg/dL (ref 8.9–10.3)
Chloride: 115 mmol/L — ABNORMAL HIGH (ref 101–111)
GFR calc Af Amer: >60 mL/min (ref >60)
Glucose, Bld: 139 mg/dL — ABNORMAL HIGH (ref 65–99)
POTASSIUM: 3.5 mmol/L (ref 3.5–5.1)
SODIUM: 150 mmol/L — AB (ref 135–145)

## 2015-02-01 LAB — FOLATE RBC
Folate, Hemolysate: 237.8 ng/mL
Folate, RBC: 736 ng/mL (ref >498)
HEMATOCRIT: 32.3 % — AB (ref 37.5–51.0)

## 2015-02-01 NOTE — Progress Notes (Signed)
Patient ID: [name], male   DOB: 1950-11-13, 64 y.o.   MRN: [id number]     Referring Physician(s): Dr. Kendrick Fries  Subjective:  Patient is s/p Perc G-tube placement.  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 Appears to be doing well.  Nurse in room and confirms he is doing ok and reports no issues with G-Tube.  Allergies: Review of patient's allergies indicates no known allergies.  Medications: Prior to Admission medications   Medication Sig Start Date End Date Taking? Authorizing Provider  acetaminophen (TYLENOL) 160 MG/5ML solution Place 20.3 mLs (650 mg total) into feeding tube every 6 (six) hours as needed for mild pain, headache or fever. 01/29/15   Simonne Martinet, NP  Amino Acids-Protein Hydrolys (FEEDING SUPPLEMENT, PRO-STAT SUGAR FREE 64,) LIQD Place 30 mLs into feeding tube 3 (three) times daily. 01/29/15   Simonne Martinet, NP  antiseptic oral rinse (CPC / CETYLPYRIDINIUM CHLORIDE 0.05%) 0.05 % LIQD solution 7 mLs by Mouth Rinse route 2 times daily at 12 noon and 4 pm. 01/29/15   Simonne Martinet, NP  ceFEPIme 2 g in dextrose 5 % 50 mL Inject 2 g into the vein every 12 (twelve) hours. 01/29/15   Simonne Martinet, NP  chlorhexidine (PERIDEX) 0.12 % solution 15 mLs by Mouth Rinse route 2 (two) times daily. 01/29/15   Simonne Martinet, NP  erythromycin (EES) 400 MG/5ML suspension Place 5 mLs (400 mg total) into feeding tube every 8 (eight) hours. 01/29/15   Simonne Martinet, NP  famotidine (PEPCID) 40 MG/5ML suspension Place 2.5 mLs (20 mg total) into feeding tube daily. 01/29/15   Simonne Martinet, NP  fentaNYL (SUBLIMAZE) 100 MCG/2ML injection Inject 0.5-2 mLs (25-100 mcg total) into the vein every 2 (two) hours as needed for severe pain. 01/29/15   Simonne Martinet, NP  folic acid (FOLVITE) 1 MG tablet Take 1 tablet (1 mg total) by mouth daily. 01/29/15   Simonne Martinet, NP  heparin 100-0.45 UNIT/ML-% infusion Inject 1,450 Units/hr into the vein continuous. 01/29/15   Simonne Martinet, NP    hydrALAZINE (APRESOLINE) 20 MG/ML injection Inject 0.5-2 mLs (10-40 mg total) into the vein every 4 (four) hours as needed (To maintain SBP < 170 mmHg). 01/29/15   Simonne Martinet, NP  insulin aspart (NOVOLOG) 100 UNIT/ML injection Inject 0-9 Units into the skin every 4 (four) hours. 01/29/15   Simonne Martinet, NP  isosorbide-hydrALAZINE (BIDIL) 20-37.5 MG per tablet Take 1 tablet by mouth 2 (two) times daily. 01/29/15   Simonne Martinet, NP  LORazepam (ATIVAN) 2 MG/ML injection Inject 0.25-0.5 mLs (0.5-1 mg total) into the vein every 4 (four) hours as needed for anxiety. 01/29/15   Simonne Martinet, NP  Nutritional Supplements (FEEDING SUPPLEMENT, NEPRO CARB STEADY,) LIQD Place 1,000 mLs into feeding tube daily. 01/29/15   Simonne Martinet, NP  ondansetron [facility]) 4 MG/2ML SOLN injection Inject 2 mLs (4 mg total) into the vein every 6 (six) hours as needed for nausea. 01/29/15   Simonne Martinet, NP  potassium chloride 20 MEQ/15ML (10%) SOLN Place 30 mLs (40 mEq total) into feeding tube once. 01/29/15   Simonne Martinet, NP  Potassium Chloride in Dextrose (DEXTROSE 5 % WITH KCL 20 MEQ / L) 20-5 MEQ/L-% Inject 1,000 mLs (20 mEq total) into the vein continuous. 01/29/15   Simonne Martinet, NP  sodium chloride 0.9 % infusion Inject 250 mLs into the vein as needed (if IV carrier fluid needed.). 01/29/15  Simonne Martinet, NP  Water For Irrigation, Sterile (FREE WATER) SOLN Place 300 mLs into feeding tube every 2 (two) hours. 01/29/15   Simonne Martinet, NP     Vital Signs: BP 164/137 mmHg  Pulse 85  SpO2 100%  Physical Exam  Awake G-tube in place No leakage around site. No erythema.  Imaging: Ct Abdomen Wo Contrast  01/28/2015   CLINICAL DATA:  Anticipated percutaneous G-tube placement. Preprocedural evaluation. Initial encounter.  EXAM: CT ABDOMEN WITHOUT CONTRAST  TECHNIQUE: Multidetector CT imaging of the abdomen was performed following the standard protocol without IV contrast.  COMPARISON:  Abdominal  radiograph 01/19/2015.  FINDINGS: Lower chest: Evaluation of the lung bases is mildly degraded by breathing artifact. There is trace pleural fluid and basilar atelectasis bilaterally. There is a small amount of gas behind the lower sternum, seen on image 1. There is no generalized pneumomediastinum or pneumothorax. The heart is enlarged.  Hepatobiliary: The liver demonstrates decreased density consistent with steatosis. No focal lesions are apparent on noncontrast imaging. No evidence of gallstones, gallbladder wall thickening or biliary dilatation.  Pancreas: Unremarkable. No pancreatic ductal dilatation or surrounding inflammatory changes.  Spleen: Normal in size without focal abnormality.  Adrenals/Urinary Tract: Both adrenal glands appear normal.There are bilateral renal calculi, larger and more numerous on the right. There is a 6 mm calculus within the mid right kidney on image 40. There is a 12 mm calculus in the interpolar region of the left kidney on image 43. The left collecting system has a UPJ configuration. There is no asymmetric perinephric soft tissue stranding or proximal ureteral calculus. There is a probable 11 mm exophytic left renal cyst. The pelvis was not imaged.  Stomach/Bowel: Nasogastric tube extends into the distal stomach. There is a proximal duodenal diverticulum. There is no bowel wall thickening or significant distention. There is no significant extension of the transverse colon anterior to the stomach. The appendix appears normal.  Vascular/Lymphatic: There are no enlarged abdominal lymph nodes. There are small lymph nodes within the porta hepatis. IVC filter is noted. There is aortoiliac atherosclerosis.  Other: Small umbilical hernia containing only fat.  Musculoskeletal: No acute or significant osseous findings.  IMPRESSION: 1. No significant colonic interposition between the anterior abdominal wall and stomach. 2. Hepatic steatosis. 3. Bilateral nephrolithiasis. 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 4. Cardiomegaly, bibasilar atelectasis and a small amount of retrosternal air, likely related to previous thoracic intervention.   Electronically Signed   By: Carey Bullocks M.D.   On: 01/28/2015 17:26   Ct Head Wo Contrast  01/28/2015   CLINICAL DATA:  Acute encephalopathy. Unable to follow commands. History of acute on chronic respiratory failure. Initial encounter.  EXAM: CT HEAD WITHOUT CONTRAST  TECHNIQUE: Contiguous axial images were obtained from the base of the skull through the vertex without intravenous contrast.  COMPARISON:  None.  FINDINGS: There is no evidence of acute intracranial hemorrhage, mass lesion, brain edema or extra-axial fluid collection. The ventricles and subarachnoid spaces are mildly prominent for age. There is mild periventricular low-density most consistent with small vessel ischemic change. No evidence of cortical infarct.  Nasogastric tube is in place. There is mild mucosal thickening throughout the paranasal sinuses without air-fluid levels. There are bilateral mastoid effusions without definite coalescence. There is opacification of the left middle ear. The right middle ear appears clear. The calvarium is intact.  IMPRESSION: 1. No acute intracranial findings demonstrated. Mild atrophy and periventricular white matter disease. 2.  Bilateral mastoid effusions with opacification of the left middle ear.   Electronically Signed   By: Carey Bullocks M.D.   On: 01/28/2015 17:10   Ir Gastrostomy Tube Mod Sed  01/31/2015   CLINICAL DATA:  64 year old male with chronic respiratory failure, dysphagia and now protein calorie malnutrition. Percutaneous gastrostomy tube is warranted.  EXAM: PERC PLACEMENT GASTROSTOMY  Date: 01/31/2015  PROCEDURE: 1. Fluoroscopically guided placement of percutaneous pull-through gastrostomy tube. Interventional Radiologist:  [name], MD  ANESTHESIA/SEDATION: 1 mg Versed said  administered for anxiolysis.  FLUOROSCOPY TIME:  1 minutes 48 seconds  122.1 mGy  CONTRAST:  15mL OMNIPAQUE IOHEXOL 300 MG/ML  SOLN  MEDICATIONS: 3 g Ancef was administered intravenously within 1 hour of skin incision.  TECHNIQUE: Informed consent was obtained from the patient following explanation of the procedure, risks, benefits and alternatives. The patient understands, agrees and consents for the procedure. All questions were addressed. A time out was performed.  Maximal barrier sterile technique utilized including caps, mask, sterile gowns, sterile gloves, large sterile drape, hand hygiene, and chlorhexadine skin prep.  An angled catheter was advanced over a wire under fluoroscopic guidance through the nose, down the esophagus and into the body of the stomach. The stomach was then insufflated with several 100 ml of air. Fluoroscopy confirmed location of the gastric bubble, as well as inferior displacement of the barium stained colon. Under direct fluoroscopic guidance, a single T-tack was placed, and the anterior gastric wall drawn up against the anterior abdominal wall. Percutaneous access was then obtained into the mid gastric body with an 18 gauge sheath needle. Aspiration of air, and injection of contrast material under fluoroscopy confirmed needle placement.  An Amplatz wire was advanced in the gastric body and the access needle exchanged for a 9-French vascular sheath. A snare device was advanced through the vascular sheath and an Amplatz wire advanced through the angled catheter. The Amplatz wire was successfully snared and this was pulled up through the esophagus and out the mouth. A 20-French Burnell Blanks MIC-PEG tube was then connected to the snare and pulled through the mouth, down the esophagus, into the stomach and out to the anterior abdominal wall. Hand injection of contrast material confirmed intragastric location. The T-tack retention suture was then cut. The pull through peg tube was then  secured with the external bumper and capped.  The patient will be observed for several hours with the newly placed tube on low wall suction to evaluate for any post procedure complication. The patient tolerated the procedure well, there is no immediate complication.  IMPRESSION: Successful placement of a 20 French pull through gastrostomy tube.  Signed,  [name], MD  [facility]  [facility]   Electronically Signed   By: Malachy Moan M.D.   On: 01/31/2015 15:48   Dg Chest Port 1 View  01/31/2015   CLINICAL DATA:  PICC line placement  EXAM: PORTABLE CHEST - 1 VIEW  COMPARISON:  01/23/2015  FINDINGS: Cardiomegaly again noted. There is mild interstitial prominence bilateral probable mild interstitial edema. Stable tracheostomy and NG tube position. Stable right IJ dialysis catheter with tip in right atrium. There is left arm PICC line with tip in SVC right atrium junction. No pneumothorax.  IMPRESSION: Mild interstitial prominence bilateral suspicious for mild interstitial edema. Left PICC line with tip in SVC right atrium junction. No pneumothorax. Stable NG tube position and tracheostomy.   Electronically Signed   By: Lanette Hampshire.D.  On: 01/31/2015 14:53   Dg Abd Portable 1v  01/29/2015   CLINICAL DATA:  Check nasogastric catheter  EXAM: PORTABLE ABDOMEN - 1 VIEW  COMPARISON:  None.  FINDINGS: Nasogastric catheter is noted within the distal stomach. Scattered large and small bowel gas is noted. An IVC filter is noted in place.  IMPRESSION: Nasogastric catheter in the distal stomach.   Electronically Signed   By: Alcide CleverMark  Lukens M.D.   On: 01/29/2015 18:41    Labs:  CBC:  Recent Labs  01/27/15 0322 01/28/15 0534 01/29/15 0305 01/30/15 0655  WBC 11.9* 10.1 10.1 9.5  HGB 10.7* 10.6* 10.3* 10.7*  HCT 35.3* 35.3* 34.1* 36.2*  PLT 152 153 155 169    COAGS:  Recent Labs  01/09/15 1510 01/28/15 2009 01/29/15 0305  INR 1.07 1.22  1.13  APTT 25 55*  --     BMP:  Recent Labs  01/29/15 0305 01/30/15 0655 01/31/15 0322 02/01/15 0704  NA 154* 152* 153* 150*  K 3.3* 3.6 3.3* 3.5  CL 119* 118* 116* 115*  CO2 26 26 28 25   GLUCOSE 141* 161* 126* 139*  BUN 88* 71* 61* 43*  CALCIUM 9.7 9.7 10.0 9.4  CREATININE 1.28* 1.14 1.03 0.98  GFRNONAA 59* >60 >60 >60  GFRAA >60 >60 >60 >60    LIVER FUNCTION TESTS:  Recent Labs  12/30/14 0548 01/02/15 0430  01/24/15 0342  01/26/15 0232 01/27/15 0322 01/28/15 0534 01/30/15 0655  BILITOT 1.1 1.9*  --  1.4*  --   --   --   --  0.8  AST 50* 45*  --  57*  --   --   --   --  45*  ALT 34 29  --  36  --   --   --   --  49  ALKPHOS 69 80  --  91  --   --   --   --  113  PROT 6.4 7.2  --  6.1*  --   --   --   --  6.8  ALBUMIN 3.1* 3.3*  < > 2.5*  < > 2.5* 2.7* 2.7* 2.8*  < > = values in this interval not displayed.  Assessment and Plan:  Respiratory Failure Encephalopathy Malnutrition with need for long term care. S/P G-Tube placement 5/26 Ok to start tube feeds today. Will sign off   SignedGolden Pop: Jahleel Stroschein R 02/01/2015, 9:10 AM   I spent a total of 15 Minutes in face to face in Follow up, greater than 50% of which was counseling/coordinating care for perc G-Tube

## 2015-02-02 LAB — BLOOD GAS, ARTERIAL
Acid-base deficit: 1 mmol/L (ref 0.0–2.0)
Bicarbonate: 24.2 mEq/L — ABNORMAL HIGH (ref 20.0–24.0)
FIO2: 0.28 %
O2 SAT: 95.2 %
PATIENT TEMPERATURE: 98.6
PH ART: 7.326 — AB (ref 7.350–7.450)
PO2 ART: 73.3 mmHg — AB (ref 80.0–100.0)
TCO2: 25.6 mmol/L (ref 0–100)
pCO2 arterial: 47.6 mmHg — ABNORMAL HIGH (ref 35.0–45.0)

## 2015-02-03 ENCOUNTER — Other Ambulatory Visit (HOSPITAL_COMMUNITY): Payer: Self-pay

## 2015-02-03 LAB — CULTURE, BLOOD (ROUTINE X 2)
CULTURE: NO GROWTH
Culture: NO GROWTH

## 2015-02-03 LAB — POTASSIUM: Potassium: 4.7 mmol/L (ref 3.5–5.1)

## 2015-02-04 LAB — CBC
HEMATOCRIT: 29.6 % — AB (ref 39.0–52.0)
HEMOGLOBIN: 9.3 g/dL — AB (ref 13.0–17.0)
MCH: 31.1 pg (ref 26.0–34.0)
MCHC: 31.4 g/dL (ref 30.0–36.0)
MCV: 99 fL (ref 78.0–100.0)
Platelets: 187 10*3/uL (ref 150–400)
RBC: 2.99 MIL/uL — ABNORMAL LOW (ref 4.22–5.81)
RDW: 16.9 % — ABNORMAL HIGH (ref 11.5–15.5)
WBC: 7.2 10*3/uL (ref 4.0–10.5)

## 2015-02-04 LAB — BASIC METABOLIC PANEL
ANION GAP: 6 (ref 5–15)
BUN: 33 mg/dL — ABNORMAL HIGH (ref 6–20)
CALCIUM: 9.7 mg/dL (ref 8.9–10.3)
CO2: 28 mmol/L (ref 22–32)
Chloride: 109 mmol/L (ref 101–111)
Creatinine, Ser: 0.99 mg/dL (ref 0.61–1.24)
GFR calc Af Amer: >60 mL/min (ref >60)
GFR calc non Af Amer: >60 mL/min (ref >60)
Glucose, Bld: 112 mg/dL — ABNORMAL HIGH (ref 65–99)
Potassium: 4.2 mmol/L (ref 3.5–5.1)
Sodium: 143 mmol/L (ref 135–145)

## 2015-02-04 LAB — MAGNESIUM: Magnesium: 1.7 mg/dL (ref 1.7–2.4)

## 2015-02-11 ENCOUNTER — Other Ambulatory Visit (HOSPITAL_COMMUNITY): Payer: No Typology Code available for payment source

## 2015-02-11 LAB — CBC
HEMATOCRIT: 33.7 % — AB (ref 39.0–52.0)
HEMOGLOBIN: 10.3 g/dL — AB (ref 13.0–17.0)
MCH: 30.4 pg (ref 26.0–34.0)
MCHC: 30.6 g/dL (ref 30.0–36.0)
MCV: 99.4 fL (ref 78.0–100.0)
PLATELETS: 171 10*3/uL (ref 150–400)
RBC: 3.39 MIL/uL — ABNORMAL LOW (ref 4.22–5.81)
RDW: 17.1 % — AB (ref 11.5–15.5)
WBC: 9.7 10*3/uL (ref 4.0–10.5)

## 2015-02-11 LAB — BASIC METABOLIC PANEL
Anion gap: 12 (ref 5–15)
BUN: 25 mg/dL — ABNORMAL HIGH (ref 6–20)
CALCIUM: 8.7 mg/dL — AB (ref 8.9–10.3)
CO2: 28 mmol/L (ref 22–32)
Chloride: 104 mmol/L (ref 101–111)
Creatinine, Ser: 0.92 mg/dL (ref 0.61–1.24)
GFR calc Af Amer: >60 mL/min (ref >60)
Glucose, Bld: 115 mg/dL — ABNORMAL HIGH (ref 65–99)
Potassium: 3.6 mmol/L (ref 3.5–5.1)
Sodium: 144 mmol/L (ref 135–145)

## 2015-02-12 LAB — BLOOD GAS, ARTERIAL
ACID-BASE EXCESS: 5.4 mmol/L — AB (ref 0.0–2.0)
Bicarbonate: 30.8 mEq/L — ABNORMAL HIGH (ref 20.0–24.0)
FIO2: 0.5 %
O2 Saturation: 98.5 %
PCO2 ART: 57.8 mmHg — AB (ref 35.0–45.0)
PEEP: 5 cmH2O
PH ART: 7.346 — AB (ref 7.350–7.450)
PRESSURE SUPPORT: 12 cmH2O
Patient temperature: 98.6
TCO2: 32.5 mmol/L (ref 0–100)
pO2, Arterial: 127 mmHg — ABNORMAL HIGH (ref 80.0–100.0)

## 2015-02-13 LAB — BASIC METABOLIC PANEL
Anion gap: 14 (ref 5–15)
BUN: 31 mg/dL — ABNORMAL HIGH (ref 6–20)
CO2: 31 mmol/L (ref 22–32)
Calcium: 9.3 mg/dL (ref 8.9–10.3)
Chloride: 101 mmol/L (ref 101–111)
Creatinine, Ser: 0.91 mg/dL (ref 0.61–1.24)
GFR calc Af Amer: >60 mL/min (ref >60)
GFR calc non Af Amer: >60 mL/min (ref >60)
GLUCOSE: 115 mg/dL — AB (ref 65–99)
Potassium: 4.5 mmol/L (ref 3.5–5.1)
SODIUM: 146 mmol/L — AB (ref 135–145)

## 2015-02-13 LAB — CULTURE, RESPIRATORY W GRAM STAIN: Gram Stain: NONE SEEN

## 2015-02-13 LAB — MAGNESIUM: MAGNESIUM: 2.8 mg/dL — AB (ref 1.7–2.4)

## 2015-02-14 ENCOUNTER — Encounter (HOSPITAL_COMMUNITY): Payer: Self-pay | Admitting: Emergency Medicine

## 2015-02-14 ENCOUNTER — Inpatient Hospital Stay
Admission: RE | Admit: 2015-02-14 | Discharge: 2015-03-25 | Disposition: A | Discharge status: Skilled Nursing Facility | Payer: Self-pay | Source: Other Acute Inpatient Hospital | Attending: Internal Medicine | Admitting: Internal Medicine

## 2015-02-14 ENCOUNTER — Emergency Department (HOSPITAL_COMMUNITY)
Admission: EM | Admit: 2015-02-14 | Discharge: 2015-02-14 | Disposition: A | Discharge status: Other Acute Inpatient Hospital | Payer: Worker's Compensation | Attending: Emergency Medicine | Admitting: Emergency Medicine

## 2015-02-14 DIAGNOSIS — Z992 Dependence on renal dialysis: Secondary | ICD-10-CM

## 2015-02-14 DIAGNOSIS — Z87828 Personal history of other (healed) physical injury and trauma: Secondary | ICD-10-CM | POA: Diagnosis not present

## 2015-02-14 DIAGNOSIS — Z794 Long term (current) use of insulin: Secondary | ICD-10-CM | POA: Insufficient documentation

## 2015-02-14 DIAGNOSIS — J9690 Respiratory failure, unspecified, unspecified whether with hypoxia or hypercapnia: Secondary | ICD-10-CM

## 2015-02-14 DIAGNOSIS — Z95828 Presence of other vascular implants and grafts: Secondary | ICD-10-CM

## 2015-02-14 DIAGNOSIS — Z8739 Personal history of other diseases of the musculoskeletal system and connective tissue: Secondary | ICD-10-CM | POA: Diagnosis not present

## 2015-02-14 DIAGNOSIS — R1110 Vomiting, unspecified: Secondary | ICD-10-CM

## 2015-02-14 DIAGNOSIS — Z720 Tobacco use: Secondary | ICD-10-CM | POA: Insufficient documentation

## 2015-02-14 DIAGNOSIS — J9500 Unspecified tracheostomy complication: Secondary | ICD-10-CM | POA: Diagnosis not present

## 2015-02-14 DIAGNOSIS — J9509 Other tracheostomy complication: Secondary | ICD-10-CM | POA: Diagnosis present

## 2015-02-14 LAB — BASIC METABOLIC PANEL
ANION GAP: 11 (ref 5–15)
BUN: 34 mg/dL — AB (ref 6–20)
CALCIUM: 9.5 mg/dL (ref 8.9–10.3)
CO2: 33 mmol/L — AB (ref 22–32)
Chloride: 102 mmol/L (ref 101–111)
Creatinine, Ser: 0.93 mg/dL (ref 0.61–1.24)
GFR calc Af Amer: >60 mL/min (ref >60)
Glucose, Bld: 124 mg/dL — ABNORMAL HIGH (ref 65–99)
POTASSIUM: 4 mmol/L (ref 3.5–5.1)
Sodium: 146 mmol/L — ABNORMAL HIGH (ref 135–145)

## 2015-02-14 LAB — HEMOGLOBIN A1C
HEMOGLOBIN A1C: 6.3 % — AB (ref 4.8–5.6)
MEAN PLASMA GLUCOSE: 134 mg/dL

## 2015-02-14 NOTE — Consult Note (Signed)
  Problems with tracheostomy, the cuff has ruptured. He is sitting up breathing without difficulty. I was able to change to a new #6 extra long tracheostomy without difficulty. He can be transferred back. Follow-up when necessary

## 2015-02-14 NOTE — ED Notes (Signed)
Pt currently trying to sit up to the point of getting out of bed and taking leads/O2 trach collar off.  Pt educated on importance of staying in bed and the need to keep leads/O2 on.

## 2015-02-14 NOTE — ED Notes (Addendum)
[facility] made aware pt trach issue has been fixed. Is ready to be transported [facility] to [facility]. Reports  They will come down with bed within 15-20 mins. 6.0 shiley extra long cuffed tube placed.

## 2015-02-14 NOTE — ED Notes (Signed)
Pt sitting at end of bed, pt asked to get back up in bed.

## 2015-02-14 NOTE — ED Notes (Signed)
Materials at bedside for trach change.  Waiting for Dr. Pollyann Kennedy.

## 2015-02-14 NOTE — ED Notes (Signed)
Per [facility] RN - pt comes from [facility]) needing trach cuff change.  "Cuff won't hold air."  6XL Shiley aerosol trach collar.  Pt originally had R rotator cuff repair and experienced anesthesia problems from the surgery, leading to pt being in vent-dependent respiratory failure.  Pt had trach placed, but was in false track.  Pt then had surgery to place trach correctly.  Pt has hx of CHF and pulmonary edema. Pt currently coughing on and off on 6L O2.  Pt also on IV Lasix - last dose 6am today.  Full code.  Plan for Dr. Pollyann Kennedy to change trach cuff.

## 2015-02-14 NOTE — Progress Notes (Signed)
Trach change done with MD. Vitals remained within normal limits. Positive color change with CO2 detector. RT will continue to monitor.

## 2015-02-14 NOTE — ED Provider Notes (Signed)
CSN: [id number]     Arrival date & time 02/14/15  1038 History   First MD Initiated Contact with Patient 02/14/15 1056     Chief Complaint  Patient presents with  . Tracheostomy Tube Change    Cuff won't hold air     (Consider location/radiation/quality/duration/timing/severity/associated sxs/prior Treatment) HPI  Pt presenting from [facility] for tracheostomy change.  His trach cuff isn't holding air.  He has had complicated trach issues in the past, his physician there contacted ENT, Dr. Pollyann Kennedy who requested the patient be transported to the ED for trach change.  Pt states he has no [place] complaints "under the circumstances".  There are no other associated systemic symptoms, there are no other alleviating or modifying factors.   Past Medical History  Diagnosis Date  . Arthritis   . RCT (rotator cuff tear)   . Multiple abrasions     rt arm  . Morbid obesity    Past Surgical History  Procedure Laterality Date  . No past surgeries    . Shoulder arthroscopy with subacromial decompression, rotator cuff repair and bicep tendon repair Right 12/28/2014    Procedure: RIGHT SHOULDER ARTHROSCOPY WITH SUBACROMIAL DECOMPRESSION, DISTAL CLAVICLE RESECTION, ROTATOR CUFF REPAIR ;  Surgeon: Eugenia Mcalpine, MD;  Location: [facility] ORS;  Service: Orthopedics;  Laterality: Right;  . Tracheostomy tube placement N/A 01/16/2015    Procedure: TRACHEOSTOMY;  Surgeon: Flo Shanks, MD;  Location: [facility] OR;  Service: ENT;  Laterality: N/A;  . Tracheostomy revision N/A 01/18/2015    Procedure: TRACHEOSTOMY REVISION;  Surgeon: Melvenia Beam, MD;  Location: [facility] OR;  Service: ENT;  Laterality: N/A;  . Tracheostomy tube placement N/A 01/22/2015    Procedure: TRACHEOSTOMY;  Surgeon: Flo Shanks, MD;  Location: [facility] OR;  Service: ENT;  Laterality: N/A;   Family History  Problem Relation Age of Onset  . Heart disease Mother   . Heart attack Mother   . Heart disease Brother    History  Substance Use Topics  . Smoking status:  [place] Every Day Smoker  . Smokeless tobacco: Not on file  . Alcohol Use: No    Review of Systems  ROS reviewed and all otherwise negative except for mentioned in HPI    Allergies  Review of patient's allergies indicates no known allergies.  Home Medications   Prior to Admission medications   Medication Sig Start Date End Date Taking? Authorizing Provider  acetaminophen (TYLENOL) 160 MG/5ML solution Place 20.3 mLs (650 mg total) into feeding tube every 6 (six) hours as needed for mild pain, headache or fever. 01/29/15   Simonne Martinet, NP  Amino Acids-Protein Hydrolys (FEEDING SUPPLEMENT, PRO-STAT SUGAR FREE 64,) LIQD Place 30 mLs into feeding tube 3 (three) times daily. 01/29/15   Simonne Martinet, NP  antiseptic oral rinse (CPC / CETYLPYRIDINIUM CHLORIDE 0.05%) 0.05 % LIQD solution 7 mLs by Mouth Rinse route 2 times daily at 12 noon and 4 pm. 01/29/15   Simonne Martinet, NP  ceFEPIme 2 g in dextrose 5 % 50 mL Inject 2 g into the vein every 12 (twelve) hours. 01/29/15   Simonne Martinet, NP  chlorhexidine (PERIDEX) 0.12 % solution 15 mLs by Mouth Rinse route 2 (two) times daily. 01/29/15   Simonne Martinet, NP  erythromycin (EES) 400 MG/5ML suspension Place 5 mLs (400 mg total) into feeding tube every 8 (eight) hours. 01/29/15   Simonne Martinet, NP  famotidine (PEPCID) 40 MG/5ML suspension Place 2.5 mLs (20 mg total) into feeding tube  daily. 01/29/15   Simonne Martinet, NP  fentaNYL (SUBLIMAZE) 100 MCG/2ML injection Inject 0.5-2 mLs (25-100 mcg total) into the vein every 2 (two) hours as needed for severe pain. 01/29/15   Simonne Martinet, NP  folic acid (FOLVITE) 1 MG tablet Take 1 tablet (1 mg total) by mouth daily. 01/29/15   Simonne Martinet, NP  heparin 100-0.45 UNIT/ML-% infusion Inject 1,450 Units/hr into the vein continuous. 01/29/15   Simonne Martinet, NP  hydrALAZINE (APRESOLINE) 20 MG/ML injection Inject 0.5-2 mLs (10-40 mg total) into the vein every 4 (four) hours as needed (To maintain SBP <  170 mmHg). 01/29/15   Simonne Martinet, NP  insulin aspart (NOVOLOG) 100 UNIT/ML injection Inject 0-9 Units into the skin every 4 (four) hours. 01/29/15   Simonne Martinet, NP  isosorbide-hydrALAZINE (BIDIL) 20-37.5 MG per tablet Take 1 tablet by mouth 2 (two) times daily. 01/29/15   Simonne Martinet, NP  LORazepam (ATIVAN) 2 MG/ML injection Inject 0.25-0.5 mLs (0.5-1 mg total) into the vein every 4 (four) hours as needed for anxiety. 01/29/15   Simonne Martinet, NP  Nutritional Supplements (FEEDING SUPPLEMENT, NEPRO CARB STEADY,) LIQD Place 1,000 mLs into feeding tube daily. 01/29/15   Simonne Martinet, NP  ondansetron [facility]) 4 MG/2ML SOLN injection Inject 2 mLs (4 mg total) into the vein every 6 (six) hours as needed for nausea. 01/29/15   Simonne Martinet, NP  potassium chloride 20 MEQ/15ML (10%) SOLN Place 30 mLs (40 mEq total) into feeding tube once. 01/29/15   Simonne Martinet, NP  Potassium Chloride in Dextrose (DEXTROSE 5 % WITH KCL 20 MEQ / L) 20-5 MEQ/L-% Inject 1,000 mLs (20 mEq total) into the vein continuous. 01/29/15   Simonne Martinet, NP  sodium chloride 0.9 % infusion Inject 250 mLs into the vein as needed (if IV carrier fluid needed.). 01/29/15   Simonne Martinet, NP  Water For Irrigation, Sterile (FREE WATER) SOLN Place 300 mLs into feeding tube every 2 (two) hours. 01/29/15   Simonne Martinet, NP   BP 158/85 mmHg  Pulse 82  Temp(Src) 100 F (37.8 C) (Oral)  Resp 22  Ht 5\' 11"  (1.803 m)  Wt 289 lb 4.8 oz (131.226 kg)  BMI 40.37 kg/m2  SpO2 96%  Vitals reviewed Physical Exam  Physical Examination: General appearance - alert, well appearing, and in no distress Mental status - alert, oriented to person, place, and time Eyes - no conjunctival injection no scleral icterus Mouth - mucous membranes moist, pharynx normal without lesions Neck - trach in place Chest - BSS, coarse rhonchi heard throughout both lung fields Heart - normal rate, regular rhythm, normal S1, S2, no murmurs, rubs, clicks  or gallops Neurological - alert, oriented, normal speech, no focal findings or movement disorder noted Extremities - peripheral pulses normal, no pedal edema, no clubbing or cyanosis Skin - normal coloration and turgor, no rashes  ED Course  Procedures (including critical care time) Labs Review Labs Reviewed - No data to display  Imaging Review No results found.   EKG Interpretation None      MDM   Final diagnoses:  Tracheostomy complication, unspecified tracheostomy complication    [facility]  Dr. Pollyann Kennedy has seen patient and changed trach.  Pt discharged [facility] to [facility] for ongoing care.      Jerelyn Scott, MD 02/14/15 [phone]

## 2015-02-14 NOTE — ED Notes (Signed)
Dr Rosen at bedside 

## 2015-02-15 LAB — BASIC METABOLIC PANEL
ANION GAP: 12 (ref 5–15)
BUN: 33 mg/dL — AB (ref 6–20)
CALCIUM: 9.9 mg/dL (ref 8.9–10.3)
CHLORIDE: 100 mmol/L — AB (ref 101–111)
CO2: 34 mmol/L — ABNORMAL HIGH (ref 22–32)
Creatinine, Ser: 1.01 mg/dL (ref 0.61–1.24)
GFR calc non Af Amer: >60 mL/min (ref >60)
Glucose, Bld: 142 mg/dL — ABNORMAL HIGH (ref 65–99)
Potassium: 4.1 mmol/L (ref 3.5–5.1)
SODIUM: 146 mmol/L — AB (ref 135–145)

## 2015-02-15 LAB — CBC
HEMATOCRIT: 36.9 % — AB (ref 39.0–52.0)
HEMOGLOBIN: 11.3 g/dL — AB (ref 13.0–17.0)
MCH: 30.9 pg (ref 26.0–34.0)
MCHC: 30.6 g/dL (ref 30.0–36.0)
MCV: 100.8 fL — AB (ref 78.0–100.0)
Platelets: 182 10*3/uL (ref 150–400)
RBC: 3.66 MIL/uL — ABNORMAL LOW (ref 4.22–5.81)
RDW: 17.1 % — ABNORMAL HIGH (ref 11.5–15.5)
WBC: 7.7 10*3/uL (ref 4.0–10.5)

## 2015-02-16 LAB — BASIC METABOLIC PANEL
ANION GAP: 16 — AB (ref 5–15)
BUN: 40 mg/dL — ABNORMAL HIGH (ref 6–20)
CALCIUM: 10.2 mg/dL (ref 8.9–10.3)
CO2: 31 mmol/L (ref 22–32)
Chloride: 97 mmol/L — ABNORMAL LOW (ref 101–111)
Creatinine, Ser: 1.14 mg/dL (ref 0.61–1.24)
GFR calc non Af Amer: >60 mL/min (ref >60)
Glucose, Bld: 111 mg/dL — ABNORMAL HIGH (ref 65–99)
Potassium: 4.8 mmol/L (ref 3.5–5.1)
Sodium: 144 mmol/L (ref 135–145)

## 2015-02-17 LAB — BRAIN NATRIURETIC PEPTIDE: B NATRIURETIC PEPTIDE 5: 21.9 pg/mL (ref 0.0–100.0)

## 2015-02-17 LAB — BASIC METABOLIC PANEL
ANION GAP: 14 (ref 5–15)
BUN: 36 mg/dL — ABNORMAL HIGH (ref 6–20)
CO2: 34 mmol/L — ABNORMAL HIGH (ref 22–32)
Calcium: 10 mg/dL (ref 8.9–10.3)
Chloride: 90 mmol/L — ABNORMAL LOW (ref 101–111)
Creatinine, Ser: 1.02 mg/dL (ref 0.61–1.24)
GFR calc Af Amer: >60 mL/min (ref >60)
Glucose, Bld: 129 mg/dL — ABNORMAL HIGH (ref 65–99)
POTASSIUM: 3.6 mmol/L (ref 3.5–5.1)
Sodium: 138 mmol/L (ref 135–145)

## 2015-02-19 LAB — BASIC METABOLIC PANEL
Anion gap: 13 (ref 5–15)
BUN: 45 mg/dL — ABNORMAL HIGH (ref 6–20)
CALCIUM: 10.6 mg/dL — AB (ref 8.9–10.3)
CO2: 36 mmol/L — AB (ref 22–32)
CREATININE: 1.04 mg/dL (ref 0.61–1.24)
Chloride: 84 mmol/L — ABNORMAL LOW (ref 101–111)
GFR calc Af Amer: >60 mL/min (ref >60)
GFR calc non Af Amer: >60 mL/min (ref >60)
GLUCOSE: 141 mg/dL — AB (ref 65–99)
Potassium: 3.1 mmol/L — ABNORMAL LOW (ref 3.5–5.1)
Sodium: 133 mmol/L — ABNORMAL LOW (ref 135–145)

## 2015-02-19 LAB — CBC
HCT: 33.7 % — ABNORMAL LOW (ref 39.0–52.0)
Hemoglobin: 10.9 g/dL — ABNORMAL LOW (ref 13.0–17.0)
MCH: 30.7 pg (ref 26.0–34.0)
MCHC: 32.3 g/dL (ref 30.0–36.0)
MCV: 94.9 fL (ref 78.0–100.0)
Platelets: 209 10*3/uL (ref 150–400)
RBC: 3.55 MIL/uL — ABNORMAL LOW (ref 4.22–5.81)
RDW: 16.5 % — ABNORMAL HIGH (ref 11.5–15.5)
WBC: 8.7 10*3/uL (ref 4.0–10.5)

## 2015-02-20 ENCOUNTER — Other Ambulatory Visit (HOSPITAL_COMMUNITY): Payer: Self-pay

## 2015-02-20 ENCOUNTER — Encounter: Payer: Self-pay | Admitting: Certified Registered Nurse Anesthetist

## 2015-02-20 LAB — BLOOD GAS, ARTERIAL
Acid-Base Excess: 11 mmol/L — ABNORMAL HIGH (ref 0.0–2.0)
BICARBONATE: 35.6 meq/L — AB (ref 20.0–24.0)
FIO2: 0.3 %
O2 Saturation: 92.3 %
PEEP: 5 cmH2O
Patient temperature: 98.6
RATE: 16 resp/min
TCO2: 37.2 mmol/L (ref 0–100)
VT: 0.5 mL
pCO2 arterial: 52.8 mmHg — ABNORMAL HIGH (ref 35.0–45.0)
pH, Arterial: 7.443 (ref 7.350–7.450)
pO2, Arterial: 62.7 mmHg — ABNORMAL LOW (ref 80.0–100.0)

## 2015-02-20 MED ORDER — SUCCINYLCHOLINE CHLORIDE 20 MG/ML IJ SOLN
INTRAMUSCULAR | Status: DC | PRN
  Administered 2015-02-20: 140 mg via INTRAVENOUS

## 2015-02-20 MED ORDER — PROPOFOL 10 MG/ML IV BOLUS
INTRAVENOUS | Status: DC | PRN
  Administered 2015-02-20: 200 mg via INTRAVENOUS

## 2015-02-20 NOTE — Anesthesia Procedure Notes (Signed)
Procedure Name: Intubation Performed by: Marcene Duos Pre-anesthesia Checklist: Patient identified, Emergency Drugs available, Suction available, Patient being monitored and Timeout performed Patient Re-evaluated:Patient Re-evaluated prior to inductionOxygen Delivery Method: Ambu bag Preoxygenation: Pre-oxygenation with 100% oxygen Intubation Type: IV induction, Rapid sequence and Cricoid Pressure applied Laryngoscope Size: Glidescope and 3 Grade View: Grade I Tube type: Oral Tube size: 7.5 mm Number of attempts: 1 Placement Confirmation: ETT inserted through vocal cords under direct vision,  CO2 detector and breath sounds checked- equal and bilateral Secured at: 24 cm Dental Injury: Teeth and Oropharynx as per pre-operative assessment  Future Recommendations: Recommend- induction with short-acting agent, and alternative techniques readily available

## 2015-02-20 NOTE — Transfer of Care (Signed)
Immediate Anesthesia Transfer of Care Note  Patient: [name]  Procedure(s) Performed: * No procedures listed *  [facility]  Anesthesia Type:General  Level of Consciousness: sedated and Patient remains intubated per anesthesia plan  Airway & Oxygen Therapy: Patient remains intubated per anesthesia plan and Patient placed on Ventilator (see vital sign flow sheet for setting)  Post-op Assessment: Report given to RN and Post -op Vital signs reviewed and stable  Post vital signs: Reviewed and stable  Last Vitals:  Filed Vitals:   02/20/15 1521  BP: 170/90    Complications: No apparent anesthesia complications

## 2015-02-21 ENCOUNTER — Other Ambulatory Visit (HOSPITAL_COMMUNITY): Payer: Self-pay

## 2015-02-21 DIAGNOSIS — I82403 Acute embolism and thrombosis of unspecified deep veins of lower extremity, bilateral: Secondary | ICD-10-CM

## 2015-02-21 DIAGNOSIS — J9602 Acute respiratory failure with hypercapnia: Secondary | ICD-10-CM

## 2015-02-21 DIAGNOSIS — Z930 Tracheostomy status: Secondary | ICD-10-CM

## 2015-02-21 LAB — BLOOD GAS, ARTERIAL
Acid-Base Excess: 10.1 mmol/L — ABNORMAL HIGH (ref 0.0–2.0)
Bicarbonate: 35.2 mEq/L — ABNORMAL HIGH (ref 20.0–24.0)
FIO2: 0.4 %
MECHVT: 500 mL
O2 Saturation: 99.3 %
PATIENT TEMPERATURE: 98.6
PEEP: 5 cmH2O
PH ART: 7.399 (ref 7.350–7.450)
RATE: 16 resp/min
TCO2: 37 mmol/L (ref 0–100)
pCO2 arterial: 58.3 mmHg (ref 35.0–45.0)
pO2, Arterial: 168 mmHg — ABNORMAL HIGH (ref 80.0–100.0)

## 2015-02-21 LAB — BASIC METABOLIC PANEL
ANION GAP: 15 (ref 5–15)
BUN: 49 mg/dL — ABNORMAL HIGH (ref 6–20)
CHLORIDE: 79 mmol/L — AB (ref 101–111)
CO2: 36 mmol/L — ABNORMAL HIGH (ref 22–32)
Calcium: 10.1 mg/dL (ref 8.9–10.3)
Creatinine, Ser: 1.57 mg/dL — ABNORMAL HIGH (ref 0.61–1.24)
GFR calc non Af Amer: 46 mL/min — ABNORMAL LOW (ref >60)
GFR, EST AFRICAN AMERICAN: 53 mL/min — AB (ref >60)
Glucose, Bld: 123 mg/dL — ABNORMAL HIGH (ref 65–99)
POTASSIUM: 4 mmol/L (ref 3.5–5.1)
SODIUM: 130 mmol/L — AB (ref 135–145)

## 2015-02-21 LAB — CBC
HEMATOCRIT: 32.5 % — AB (ref 39.0–52.0)
Hemoglobin: 10.5 g/dL — ABNORMAL LOW (ref 13.0–17.0)
MCH: 30.9 pg (ref 26.0–34.0)
MCHC: 32.3 g/dL (ref 30.0–36.0)
MCV: 95.6 fL (ref 78.0–100.0)
Platelets: 235 10*3/uL (ref 150–400)
RBC: 3.4 MIL/uL — AB (ref 4.22–5.81)
RDW: 17.2 % — AB (ref 11.5–15.5)
WBC: 8.9 10*3/uL (ref 4.0–10.5)

## 2015-02-21 LAB — POTASSIUM: Potassium: 3.2 mmol/L — ABNORMAL LOW (ref 3.5–5.1)

## 2015-02-21 NOTE — Progress Notes (Signed)
PULMONARY / CRITICAL CARE MEDICINE   Name: [name] MRN: [id number] DOB: December 05, 1950    ADMISSION DATE:  02/14/2015  REFERRING MD :  [facility]   CHIEF COMPLAINT:  Tracheostomy Dislodgement  INITIAL PRESENTATION: [name] initially admitted 4/22-5/14 for R rotator cuff repair by Ortho.  Failed extubation post-op and reintubated 4/23.  Post operative course complicated by prolonged respiratory respiratory failure, findings of testicular masses, acute renal failure requiring CVVHD, BLE DVT, and tracheostomy bleeding s/p revision.  He was transferred to [facility] 5/14 PM and tracheostomy became dislodged, re-admitted to [facility] ICU 5/15 early am.  Post intubation he was noted to have significant trach site bleeding, and pneumomediastinum/Right Pneumothorax. He had been placed back on full vent support so this required serial CXRs, but resolved spontaneously. Course was notable for fever, leukocytosis and possible HCAP on 5/17 for which empiric cefepime and vanc.  Returned to [facility] 5/24 and was progressing well, on ATC full time.  On 6/15 pt pulled trach out, RT unable to replace and pt was reintubated by anesthesia. PCCM re-consulted.   STUDIES:  4/23 US scrotum: Two solid extratesticular right scrotal masses. Primary differential diagnosis for extratesticular masses include primary or metastatic neoplasms including adenomatoid tumor, fibroma, leiomyoma, mesothelioma, or sarcoma. Urology consultation is recommended  4/26 TEE: LVEF 60-65%, Grade 1 DD, trivial pericardial effusion 4/27 [place] testes with masses 5/02 renal US: no hydronephrosis, Possible nonobstructing stone lower pole left kidney 5/23 CT head > NAICP  SIGNIFICANT EVENTS: 4/22 - 5/14  Admit for R rotator cuff repair, complicated course with prolonged resp fx, renal fx, DVT, testicular masses 5/17 re-do tracheostomy, HCAP 5/19 UFH gtt resumed 5/20 tolerating ATC. No bleeding with UFH gtt.  6/15 - pulled trach out, unable to replace per RT,  intubated by anesthesia   SUBJECTIVE:   Sedated on vent with ETT   VITAL SIGNS: BP: (150-170)/(80-90) 170/90 mmHg (06/15 1521) SpO2:  [99 %-100 %] 100 % (06/15 1521)   HEMODYNAMICS:     VENTILATOR SETTINGS:     INTAKE / OUTPUT: No intake or output data in the 24 hours ending 02/21/15 1001  PHYSICAL EXAMINATION:  Gen:  NAD, obese, sedated on vent  HENT: ETT, mm moist  PULM: resps even non labored on vent, CTA B CV: RRR, no mgr GI: BS+, soft, nontender MSK: warm and dry, scant BLE edema, venous stasis  Derm: no skin breakdown Neuro: arouses to voice, RASS -1  LABS:  CBC  Recent Labs Lab 02/15/15 1049 02/19/15 0705 02/21/15 0721  WBC 7.7 8.7 8.9  HGB 11.3* 10.9* 10.5*  HCT 36.9* 33.7* 32.5*  PLT 182 209 235   Coag's No results for input(s): APTT, INR in the last 168 hours.   BMET  Recent Labs Lab 02/17/15 0608 02/19/15 0705 02/21/15 0721  NA 138 133* 130*  K 3.6 3.1* 4.0  CL 90* 84* 79*  CO2 34* 36* 36*  BUN 36* 45* 49*  CREATININE 1.02 1.04 1.57*  GLUCOSE 129* 141* 123*   Electrolytes  Recent Labs Lab 02/17/15 0608 02/19/15 0705 02/21/15 0721  CALCIUM 10.0 10.6* 10.1   Sepsis Markers No results for input(s): LATICACIDVEN, PROCALCITON, O2SATVEN in the last 168 hours.   ABG  Recent Labs Lab 02/20/15 1830 02/21/15 0628  PHART 7.443 7.399  PCO2ART 52.8* 58.3*  PO2ART 62.7* 168*   Liver Enzymes No results for input(s): AST, ALT, ALKPHOS, BILITOT, ALBUMIN in the last 168 hours. Cardiac Enzymes No results for input(s): TROPONINI, PROBNP in the last  168 hours.   Glucose No results for input(s): GLUCAP in the last 168 hours.  CXR 6/16>>> Cardiomegaly with pulmonary vascular prominence and mild interstitial prominence consistent with congestive heart failure.   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

## 2015-02-21 NOTE — Consult Note (Signed)
[name], [name] 64 y.o., male [id number]     Chief Complaint: trach dislodgement  HPI: 64 yo wm sdveral mos s/p shoulder repair with probable hypoxic/hypercapnic resp arrest at home.  Trach placed, then dislodged and replaced in OR.  Doing well with spont ventilation and cuffless tube x several weeks.  Yesterday, dislodged trach tube.  Intubated OT and placed back on vent.  ENT called for assistance with trach tube replacement.    PMH: Past Medical History  Diagnosis Date  . Arthritis   . RCT (rotator cuff tear)   . Multiple abrasions     rt arm  . Morbid obesity     Surg Hx: Past Surgical History  Procedure Laterality Date  . No past surgeries    . Shoulder arthroscopy with subacromial decompression, rotator cuff repair and bicep tendon repair Right 12/28/2014    Procedure: RIGHT SHOULDER ARTHROSCOPY WITH SUBACROMIAL DECOMPRESSION, DISTAL CLAVICLE RESECTION, ROTATOR CUFF REPAIR ;  Surgeon: Sydnee Cabal, MD;  Location: [facility];  Service: Orthopedics;  Laterality: Right;  . Tracheostomy tube placement N/A 01/16/2015    Procedure: TRACHEOSTOMY;  Surgeon: Jodi Marble, MD;  Location: [place];  Service: ENT;  Laterality: N/A;  . Tracheostomy revision N/A 01/18/2015    Procedure: TRACHEOSTOMY REVISION;  Surgeon: Ruby Cola, MD;  Location: [place];  Service: ENT;  Laterality: N/A;  . Tracheostomy tube placement N/A 01/22/2015    Procedure: TRACHEOSTOMY;  Surgeon: Jodi Marble, MD;  Location: [facility];  Service: ENT;  Laterality: N/A;    FHx:   Family History  Problem Relation Age of Onset  . Heart disease Mother   . Heart attack Mother   . Heart disease Brother    SocHx:  reports that he has been smoking.  He does not have any smokeless tobacco history on file. He reports that he does not drink alcohol or use illicit drugs.  ALLERGIES: No Known Allergies  Medications Prior to Admission  Medication Sig Dispense Refill  . acetaminophen (TYLENOL) 160 MG/5ML solution Place 20.3 mLs (650  mg total) into feeding tube every 6 (six) hours as needed for mild pain, headache or fever. 120 mL 0  . Amino Acids-Protein Hydrolys (FEEDING SUPPLEMENT, PRO-STAT SUGAR FREE 64,) LIQD Place 30 mLs into feeding tube 3 (three) times daily. 900 mL 0  . antiseptic oral rinse (CPC / CETYLPYRIDINIUM CHLORIDE 0.05%) 0.05 % LIQD solution 7 mLs by Mouth Rinse route 2 times daily at 12 noon and 4 pm.  0  . ceFEPIme 2 g in dextrose 5 % 50 mL Inject 2 g into the vein every 12 (twelve) hours.    . chlorhexidine (PERIDEX) 0.12 % solution 15 mLs by Mouth Rinse route 2 (two) times daily. 120 mL 0  . erythromycin (EES) 400 MG/5ML suspension Place 5 mLs (400 mg total) into feeding tube every 8 (eight) hours. 100 mL 0  . famotidine (PEPCID) 40 MG/5ML suspension Place 2.5 mLs (20 mg total) into feeding tube daily. 50 mL 0  . fentaNYL (SUBLIMAZE) 100 MCG/2ML injection Inject 0.5-2 mLs (25-100 mcg total) into the vein every 2 (two) hours as needed for severe pain. 2 mL 0  . folic acid (FOLVITE) 1 MG tablet Take 1 tablet (1 mg total) by mouth daily.    . heparin 100-0.45 UNIT/ML-% infusion Inject 1,450 Units/hr into the vein continuous. 250 mL   . hydrALAZINE (APRESOLINE) 20 MG/ML injection Inject 0.5-2 mLs (10-40 mg total) into the vein every 4 (four) hours as needed (To maintain  SBP < 170 mmHg). 1 mL   . insulin aspart (NOVOLOG) 100 UNIT/ML injection Inject 0-9 Units into the skin every 4 (four) hours. 10 mL 11  . isosorbide-hydrALAZINE (BIDIL) 20-37.5 MG per tablet Take 1 tablet by mouth 2 (two) times daily.    Marland Kitchen LORazepam (ATIVAN) 2 MG/ML injection Inject 0.25-0.5 mLs (0.5-1 mg total) into the vein every 4 (four) hours as needed for anxiety. 1 mL 0  . Nutritional Supplements (FEEDING SUPPLEMENT, NEPRO CARB STEADY,) LIQD Place 1,000 mLs into feeding tube daily.  0  . ondansetron (ZOFRAN) 4 MG/2ML SOLN injection Inject 2 mLs (4 mg total) into the vein every 6 (six) hours as needed for nausea. 2 mL 0  . potassium chloride  20 MEQ/15ML (10%) SOLN Place 30 mLs (40 mEq total) into feeding tube once. 900 mL 0  . Potassium Chloride in Dextrose (DEXTROSE 5 % WITH KCL 20 MEQ / L) 20-5 MEQ/L-% Inject 1,000 mLs (20 mEq total) into the vein continuous.    . sodium chloride 0.9 % infusion Inject 250 mLs into the vein as needed (if IV carrier fluid needed.).  0  . Water For Irrigation, Sterile (FREE WATER) SOLN Place 300 mLs into feeding tube every 2 (two) hours.      Results for orders placed or performed during the hospital encounter of 02/14/15 (from the past 48 hour(s))  Blood gas, arterial     Status: Abnormal   Collection Time: 02/20/15  6:30 PM  Result Value Ref Range   FIO2 0.30 %   Delivery systems VENTILATOR    Mode ASSIST CONTROL    VT 0.500 mL   Rate 16.0 resp/min   Peep/cpap 5.0 cm H20   pH, Arterial 7.443 7.350 - 7.450   pCO2 arterial 52.8 (H) 35.0 - 45.0 mmHg   pO2, Arterial 62.7 (L) 80.0 - 100.0 mmHg   Bicarbonate 35.6 (H) 20.0 - 24.0 mEq/L   TCO2 37.2 0 - 100 mmol/L   Acid-Base Excess 11.0 (H) 0.0 - 2.0 mmol/L   O2 Saturation 92.3 %   Patient temperature 98.6    Collection site RIGHT RADIAL    Drawn by COLLECTED BY RT    Sample type ARTERIAL DRAW    Allens test (pass/fail) PASS PASS  Blood gas, arterial     Status: Abnormal   Collection Time: 02/21/15  6:28 AM  Result Value Ref Range   FIO2 0.40 %   Delivery systems VENTILATOR    Mode PRESSURE REGULATED VOLUME CONTROL    VT 500 mL   Rate 16 resp/min   Peep/cpap 5.0 cm H20   pH, Arterial 7.399 7.350 - 7.450   pCO2 arterial 58.3 (HH) 35.0 - 45.0 mmHg    Comment: CRITICAL RESULT CALLED TO, READ BACK BY AND VERIFIED WITH: KIM WYATT,RRT AT 0638,BY TIM SNIDER RRT,RCP ON 02/21/15    pO2, Arterial 168 (H) 80.0 - 100.0 mmHg   Bicarbonate 35.2 (H) 20.0 - 24.0 mEq/L   TCO2 37.0 0 - 100 mmol/L   Acid-Base Excess 10.1 (H) 0.0 - 2.0 mmol/L   O2 Saturation 99.3 %   Patient temperature 98.6    Collection site RIGHT RADIAL    Drawn by COLLECTED BY  RT    Sample type ARTERIAL DRAW    Allens test (pass/fail) PASS PASS  Basic metabolic panel     Status: Abnormal   Collection Time: 02/21/15  7:21 AM  Result Value Ref Range   Sodium 130 (L) 135 - 145 mmol/L  Potassium 4.0 3.5 - 5.1 mmol/L    Comment: SPECIMEN HEMOLYZED. HEMOLYSIS MAY AFFECT INTEGRITY OF RESULTS.   Chloride 79 (L) 101 - 111 mmol/L   CO2 36 (H) 22 - 32 mmol/L   Glucose, Bld 123 (H) 65 - 99 mg/dL   BUN 49 (H) 6 - 20 mg/dL   Creatinine, Ser 1.57 (H) 0.61 - 1.24 mg/dL   Calcium 10.1 8.9 - 10.3 mg/dL   GFR calc non Af Amer 46 (L) >60 mL/min   GFR calc Af Amer 53 (L) >60 mL/min    Comment: (NOTE) The eGFR has been calculated using the CKD EPI equation. This calculation has not been validated in all clinical situations. eGFR's persistently <60 mL/min signify possible Chronic Kidney Disease.    Anion gap 15 5 - 15  CBC     Status: Abnormal   Collection Time: 02/21/15  7:21 AM  Result Value Ref Range   WBC 8.9 4.0 - 10.5 K/uL   RBC 3.40 (L) 4.22 - 5.81 MIL/uL   Hemoglobin 10.5 (L) 13.0 - 17.0 g/dL   HCT 32.5 (L) 39.0 - 52.0 %   MCV 95.6 78.0 - 100.0 fL   MCH 30.9 26.0 - 34.0 pg   MCHC 32.3 30.0 - 36.0 g/dL   RDW 17.2 (H) 11.5 - 15.5 %   Platelets 235 150 - 400 K/uL  Potassium     Status: Abnormal   Collection Time: 02/21/15 10:55 AM  Result Value Ref Range   Potassium 3.2 (L) 3.5 - 5.1 mmol/L   Dg Chest Port 1 View  02/21/2015   CLINICAL DATA:  Respiratory failure.  EXAM: PORTABLE CHEST - 1 VIEW  COMPARISON:  02/20/2015.  FINDINGS: Endotracheal tube and right dialysis catheter stable position. Cardiomegaly with pulmonary vascular prominence and interstitial prominence. These findings are consistent congestive heart failure.  IMPRESSION: 1. Endotracheal tube and dialysis catheter stable position. 2. Cardiomegaly with pulmonary vascular prominence and mild interstitial prominence consistent with congestive heart failure. Interstitial prominence has increased from  prior exam.   Electronically Signed   By: Old Eucha   On: 02/21/2015 07:56   Dg Chest Port 1 View  02/20/2015   CLINICAL DATA:  Respiratory failure.  EXAM: PORTABLE CHEST - 1 VIEW  COMPARISON:  02/11/2015  FINDINGS: The tip of an endotracheal or tracheostomy tube projects approximately 1.5-2 cm above the carina. A right jugular dialysis catheter remains in place with tips overlying the lower SVC/high right atrium. Cardiac silhouette remains enlarged. Pulmonary vascular congestion has decreased, as have interstitial opacities on the prior study. No confluent airspace opacity, definite pleural effusion, or pneumothorax is identified.  IMPRESSION: 1. Decreased pulmonary vascular congestion. 2. Endotracheal/tracheostomy tube terminates 1.5-2 cm above the carina.   Electronically Signed   By: Logan Bores   On: 02/20/2015 16:03     There were no vitals taken for this visit.  PHYSICAL EXAM: Overall appearance:  Remains very heavyset. OT tube with mech ventilation. Head:  large Ears:  Not examined Nose:  Not examined Oral Cavity:  OT tube. Oral Pharynx/Hypopharynx/Larynx: not examined Neuro:  Not examined.  Sedated Neck: trach wound nearly healed shut    Assessment/Plan Trach tube dislodgement  Explored neck wound bluntly, re-opening surgical tract.  ETT identified in tracheal lumen.  #6 cuffless Shiley XLT proximal tube prepared.  ET tube backed out slowly.  Trach tube placed into lumen without difficulty.  Good spont ventilation.  Suction catheter passed easily into lungs with min secretions.  Tube secured  with Velcro ties in the standard fashion.  Pt tolerated procedure well.    Jodi Marble 8/73/7308, 12:59 PM

## 2015-02-22 LAB — PROTIME-INR
INR: 1.03 (ref 0.00–1.49)
PROTHROMBIN TIME: 13.7 s (ref 11.6–15.2)

## 2015-02-22 LAB — BASIC METABOLIC PANEL
Anion gap: 12 (ref 5–15)
BUN: 48 mg/dL — ABNORMAL HIGH (ref 6–20)
CHLORIDE: 84 mmol/L — AB (ref 101–111)
CO2: 38 mmol/L — ABNORMAL HIGH (ref 22–32)
Calcium: 10.5 mg/dL — ABNORMAL HIGH (ref 8.9–10.3)
Creatinine, Ser: 1.37 mg/dL — ABNORMAL HIGH (ref 0.61–1.24)
GFR calc Af Amer: >60 mL/min (ref >60)
GFR calc non Af Amer: 54 mL/min — ABNORMAL LOW (ref >60)
Glucose, Bld: 126 mg/dL — ABNORMAL HIGH (ref 65–99)
POTASSIUM: 4.1 mmol/L (ref 3.5–5.1)
SODIUM: 134 mmol/L — AB (ref 135–145)

## 2015-02-23 LAB — PROTIME-INR
INR: 1.08 (ref 0.00–1.49)
Prothrombin Time: 14.2 seconds (ref 11.6–15.2)

## 2015-02-24 LAB — PROTIME-INR
INR: 1.14 (ref 0.00–1.49)
Prothrombin Time: 14.8 seconds (ref 11.6–15.2)

## 2015-02-25 ENCOUNTER — Institutional Professional Consult (permissible substitution) (HOSPITAL_COMMUNITY): Payer: Self-pay

## 2015-02-25 LAB — BASIC METABOLIC PANEL
Anion gap: 14 (ref 5–15)
BUN: 40 mg/dL — AB (ref 6–20)
CO2: 33 mmol/L — AB (ref 22–32)
Calcium: 11.2 mg/dL — ABNORMAL HIGH (ref 8.9–10.3)
Chloride: 93 mmol/L — ABNORMAL LOW (ref 101–111)
Creatinine, Ser: 1.53 mg/dL — ABNORMAL HIGH (ref 0.61–1.24)
GFR calc Af Amer: 55 mL/min — ABNORMAL LOW (ref >60)
GFR calc non Af Amer: 47 mL/min — ABNORMAL LOW (ref >60)
GLUCOSE: 131 mg/dL — AB (ref 65–99)
POTASSIUM: 4.6 mmol/L (ref 3.5–5.1)
Sodium: 140 mmol/L (ref 135–145)

## 2015-02-25 LAB — CBC
HCT: 38.4 % — ABNORMAL LOW (ref 39.0–52.0)
Hemoglobin: 11.9 g/dL — ABNORMAL LOW (ref 13.0–17.0)
MCH: 31.2 pg (ref 26.0–34.0)
MCHC: 31 g/dL (ref 30.0–36.0)
MCV: 100.5 fL — AB (ref 78.0–100.0)
Platelets: 254 10*3/uL (ref 150–400)
RBC: 3.82 MIL/uL — AB (ref 4.22–5.81)
RDW: 17.9 % — AB (ref 11.5–15.5)
WBC: 10.1 10*3/uL (ref 4.0–10.5)

## 2015-02-25 LAB — PROTIME-INR
INR: 1.24 (ref 0.00–1.49)
Prothrombin Time: 15.8 seconds — ABNORMAL HIGH (ref 11.6–15.2)

## 2015-02-26 DIAGNOSIS — J9621 Acute and chronic respiratory failure with hypoxia: Secondary | ICD-10-CM

## 2015-02-26 DIAGNOSIS — G4733 Obstructive sleep apnea (adult) (pediatric): Secondary | ICD-10-CM

## 2015-02-26 LAB — PROTIME-INR
INR: 1.45 (ref 0.00–1.49)
Prothrombin Time: 17.8 seconds — ABNORMAL HIGH (ref 11.6–15.2)

## 2015-02-26 MED FILL — Medication: Qty: 1 | Status: AC

## 2015-02-26 NOTE — Progress Notes (Signed)
PULMONARY / CRITICAL CARE MEDICINE   Name: [name] MRN: [id number] DOB: 1951/05/29    ADMISSION DATE:  02/14/2015  REFERRING MD :  [facility]   CHIEF COMPLAINT:  Tracheostomy Dislodgement  INITIAL PRESENTATION: [name] initially admitted 4/22-5/14 for R rotator cuff repair by Ortho.  Failed extubation post-op and reintubated 4/23.  Post operative course complicated by prolonged respiratory respiratory failure, findings of testicular masses, acute renal failure requiring CVVHD, BLE DVT, and tracheostomy bleeding s/p revision.  He was transferred to [facility] 5/14 PM and tracheostomy became dislodged, re-admitted to [facility] ICU 5/15 early am.  Post intubation he was noted to have significant trach site bleeding, and pneumomediastinum/Right Pneumothorax. He had been placed back on full vent support so this required serial CXRs, but resolved spontaneously. Course was notable for fever, leukocytosis and possible HCAP on 5/17 for which empiric cefepime and vanc.  Returned to [facility] 5/24 and was progressing well, on ATC full time.  On 6/15 pt pulled trach out, RT unable to replace and pt was reintubated by anesthesia. PCCM re-consulted.   STUDIES:  4/23 US scrotum: Two solid extratesticular right scrotal masses. Primary differential diagnosis for extratesticular masses include primary or metastatic neoplasms including adenomatoid tumor, fibroma, leiomyoma, mesothelioma, or sarcoma. Urology consultation is recommended  4/26 TEE: LVEF 60-65%, Grade 1 DD, trivial pericardial effusion 4/27 [place] testes with masses 5/02 renal US: no hydronephrosis, Possible nonobstructing stone lower pole left kidney 5/23 CT head > NAICP  SIGNIFICANT EVENTS: 4/22 - 5/14  Admit for R rotator cuff repair, complicated course with prolonged resp fx, renal fx, DVT, testicular masses 5/17 re-do tracheostomy, HCAP 5/19 UFH gtt resumed 5/20 tolerating ATC. No bleeding with UFH gtt.  6/15 - pulled trach out, unable to replace per RT,  intubated by anesthesia  6/16 trach replaced per ENT  SUBJECTIVE:   NAD  VITAL SIGNS:    116/84 86 21 94% 98.3  VENTILATOR SETTINGS:     INTAKE / OUTPUT: No intake or output data in the 24 hours ending 02/26/15 1046  PHYSICAL EXAMINATION:  Gen:  NAD, obese, sleepy but arousable HENT: trach-> t collar PULM: resps even non labored on tcollar, CTA B CV: RRR, no mgr GI: BS+, soft, nontender MSK: warm and dry, scant BLE edema, venous stasis  Derm: no skin breakdown Neuro: arouses to voice, RASS -1  LABS:  CBC  Recent Labs Lab 02/21/15 0721 02/25/15 0700  WBC 8.9 10.1  HGB 10.5* 11.9*  HCT 32.5* 38.4*  PLT 235 254   Coag's  Recent Labs Lab 02/24/15 0630 02/25/15 0700 02/26/15 0535  INR 1.14 1.24 1.45     BMET  Recent Labs Lab 02/21/15 0721 02/21/15 1055 02/22/15 0540 02/25/15 0700  NA 130*  --  134* 140  K 4.0 3.2* 4.1 4.6  CL 79*  --  84* 93*  CO2 36*  --  38* 33*  BUN 49*  --  48* 40*  CREATININE 1.57*  --  1.37* 1.53*  GLUCOSE 123*  --  126* 131*   Electrolytes  Recent Labs Lab 02/21/15 0721 02/22/15 0540 02/25/15 0700  CALCIUM 10.1 10.5* 11.2*   Sepsis Markers No results for input(s): LATICACIDVEN, PROCALCITON, O2SATVEN in the last 168 hours.   ABG  Recent Labs Lab 02/20/15 1830 02/21/15 0628  PHART 7.443 7.399  PCO2ART 52.8* 58.3*  PO2ART 62.7* 168*   Liver Enzymes No results for input(s): AST, ALT, ALKPHOS, BILITOT, ALBUMIN in the last 168 hours. Cardiac Enzymes No results for  input(s): TROPONINI, PROBNP in the last 168 hours.   Glucose No results for input(s): GLUCAP in the last 168 hours.  CXR 6/16>>> Cardiomegaly with pulmonary vascular prominence and mild interstitial prominence consistent with congestive heart failure. Dg Esophagus  02/25/2015   CLINICAL DATA:  Regurgitation/ vomiting during meals. Morbid obesity. Acute encephalopathy.  EXAM: ESOPHOGRAM/BARIUM SWALLOW  TECHNIQUE: Single contrast examination was  performed using  thin barium.  FLUOROSCOPY TIME:  If the device does not provide the exposure index:  Fluoroscopy Time:  1 minutes and 36 seconds  Number of Acquired Images:  None  COMPARISON:  01/28/2015 abdominal CT.  FINDINGS: Focused, single-contrast exam was performed with the patient in a supine, minimally left posterior oblique position.  Right-sided dialysis catheter.  Tracheostomy.  No high-grade stricture or obstruction to contrast flow. Primarily on the first series, there is apparent irregularity involving the upper thoracic esophagus, including on image 80 of series 1.  Suspect esophageal dysmotility, mild. Contrast stasis in the lower thoracic esophagus.  IMPRESSION: 1. Focused, limited exam as detailed above. 2. No high-grade stricture or obstruction. 3. Probable esophageal dysmotility. This could represent presbyesophagus. 4. Irregularity involving the upper esophagus on initial swallows. This is less impressive on subsequent, better distended swallows. Cannot exclude mucosal lesion in this area. Depending on clinical suspicion, an endoscopy should be considered. Alternatively, when patient's clinical status improves, dedicated outpatient standard double-contrast study could be performed. These results will be called to the ordering clinician or representative by the Radiologist Assistant, and communication documented in the PACS or zVision Dashboard.   Electronically Signed   By: Jeronimo Greaves M.D.   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  The patient is a very difficult airway and has dislodged his trach on more more than one occasion.  I reviewed the CXR myself, no evidence of pulmonary disease.  Discussed with SSH-MD and PCCM-NP and during rounds with RT.  Respiratory failure: multi-factorial.  - Diureses as able.  - Pulmonary hygienes  - Suction as needed.  - Maintain on TC, keep trach in.  OSA: non-compliant with CPAP.  - Keep trach in place.  - Wt loss.  - When closer to decannulation will consider CPAP and capping trach.  Tracheostomy status:  - Speech orders put in to evaluate for PMV.  - Do not cap trach.  Patient seen and examined, agree with above note.  I dictated the care and orders written for this patient under my direction.  Alyson Reedy, MD [phone]

## 2015-02-27 ENCOUNTER — Other Ambulatory Visit (HOSPITAL_COMMUNITY): Payer: No Typology Code available for payment source

## 2015-02-27 LAB — PROTIME-INR
INR: 1.82 — ABNORMAL HIGH (ref 0.00–1.49)
Prothrombin Time: 21 seconds — ABNORMAL HIGH (ref 11.6–15.2)

## 2015-02-27 LAB — BASIC METABOLIC PANEL
Anion gap: 16 — ABNORMAL HIGH (ref 5–15)
BUN: 42 mg/dL — ABNORMAL HIGH (ref 6–20)
CO2: 31 mmol/L (ref 22–32)
Calcium: 10.8 mg/dL — ABNORMAL HIGH (ref 8.9–10.3)
Chloride: 95 mmol/L — ABNORMAL LOW (ref 101–111)
Creatinine, Ser: 1.54 mg/dL — ABNORMAL HIGH (ref 0.61–1.24)
GFR, EST AFRICAN AMERICAN: 54 mL/min — AB (ref >60)
GFR, EST NON AFRICAN AMERICAN: 47 mL/min — AB (ref >60)
Glucose, Bld: 133 mg/dL — ABNORMAL HIGH (ref 65–99)
Potassium: 4.3 mmol/L (ref 3.5–5.1)
Sodium: 142 mmol/L (ref 135–145)

## 2015-02-27 MED ORDER — LIDOCAINE HCL 1 % IJ SOLN
INTRAMUSCULAR | Status: AC
  Filled 2015-02-27: qty 20

## 2015-02-27 MED ORDER — CHLORHEXIDINE GLUCONATE 4 % EX LIQD
CUTANEOUS | Status: AC
  Filled 2015-02-27: qty 15

## 2015-02-28 DIAGNOSIS — J9622 Acute and chronic respiratory failure with hypercapnia: Secondary | ICD-10-CM

## 2015-02-28 LAB — PROTIME-INR
INR: 2.28 — ABNORMAL HIGH (ref 0.00–1.49)
Prothrombin Time: 24.9 seconds — ABNORMAL HIGH (ref 11.6–15.2)

## 2015-02-28 NOTE — Progress Notes (Signed)
PULMONARY / CRITICAL CARE MEDICINE   Name: [name] MRN: [id number] DOB: 1951-07-12    ADMISSION DATE:  02/14/2015  REFERRING MD :  [name]   CHIEF COMPLAINT:  Tracheostomy Dislodgement  INITIAL PRESENTATION: [name] initially admitted 4/22-5/14 for R rotator cuff repair by Ortho.  Failed extubation post-op and reintubated 4/23.  Post operative course complicated by prolonged respiratory respiratory failure, findings of testicular masses, acute renal failure requiring CVVHD, BLE DVT, and tracheostomy bleeding s/p revision.  He was transferred to [name] 5/14 PM and tracheostomy became dislodged, re-admitted to [name] 5/15 early am.  Post intubation he was noted to have significant trach site bleeding, and pneumomediastinum/Right Pneumothorax. He had been placed back on full vent support so this required serial CXRs, but resolved spontaneously. Course was notable for fever, leukocytosis and possible HCAP on 5/17 for which empiric cefepime and vanc.  Returned to [facility] 5/24 and was progressing well, on ATC full time.  On 6/15 pt pulled trach out, RT unable to replace and pt was reintubated by anesthesia. PCCM re-consulted.   STUDIES:  4/23 US scrotum: Two solid extratesticular right scrotal masses. Primary differential diagnosis for extratesticular masses include primary or metastatic neoplasms including adenomatoid tumor, fibroma, leiomyoma, mesothelioma, or sarcoma. Urology consultation is recommended  4/26 TEE: LVEF 60-65%, Grade 1 DD, trivial pericardial effusion 4/27 [place] testes with masses 5/02 renal US: no hydronephrosis, Possible nonobstructing stone lower pole left kidney 5/23 CT head > NAICP  SIGNIFICANT EVENTS: 4/22 - 5/14  Admit for R rotator cuff repair, complicated course with prolonged resp fx, renal fx, DVT, testicular masses 5/17 re-do tracheostomy, HCAP 5/19 UFH gtt resumed 5/20 tolerating ATC. No bleeding with UFH gtt.  6/15 - pulled trach out, unable to replace per RT,  intubated by anesthesia  6/16 trach replaced per ENT 6/23 t collar 24/7  SUBJECTIVE:   NAD  VITAL SIGNS:   98.2 71 14 144/77 95%  VENTILATOR SETTINGS:     INTAKE / OUTPUT: No intake or output data in the 24 hours ending 02/28/15 1007  PHYSICAL EXAMINATION:  Gen:  NAD, awake and interactive HENT: trach-> t collar PULM: resps even non labored on tcollar, CTA B CV: RRR, no mgr GI: BS+, soft, nontender MSK: warm and dry, scant BLE edema, venous stasis  Derm: no skin breakdown Neuro: arouses to voice, RASS 1  LABS:  CBC  Recent Labs Lab 02/25/15 0700  WBC 10.1  HGB 11.9*  HCT 38.4*  PLT 254   Coag's  Recent Labs Lab 02/26/15 0535 02/27/15 0630 02/28/15 0600  INR 1.45 1.82* 2.28*     BMET  Recent Labs Lab 02/22/15 0540 02/25/15 0700 02/27/15 0630  NA 134* 140 142  K 4.1 4.6 4.3  CL 84* 93* 95*  CO2 38* 33* 31  BUN 48* 40* 42*  CREATININE 1.37* 1.53* 1.54*  GLUCOSE 126* 131* 133*   Electrolytes  Recent Labs Lab 02/22/15 0540 02/25/15 0700 02/27/15 0630  CALCIUM 10.5* 11.2* 10.8*   Sepsis Markers No results for input(s): LATICACIDVEN, PROCALCITON, O2SATVEN in the last 168 hours.   ABG No results for input(s): PHART, PCO2ART, PO2ART in the last 168 hours. Liver Enzymes No results for input(s): AST, ALT, ALKPHOS, BILITOT, ALBUMIN in the last 168 hours. Cardiac Enzymes No results for input(s): TROPONINI, PROBNP in the last 168 hours.   Glucose No results for input(s): GLUCAP in the last 168 hours.  CXR 6/16>>> Cardiomegaly with pulmonary vascular prominence and mild interstitial prominence consistent with  congestive heart failure. Ir Removal Tun Cv Cath W/o Fl  02/27/2015   INDICATION: 64 year old male with a history of acute renal insufficiency with return of renal function. A tunneled hemodialysis catheter has been requested to be removed.  EXAM: BEDSIDE REMOVAL OF TUNNELED HEMODIALYSIS CATHETER.  MEDICATIONS: None  CONTRAST:  None   ANESTHESIA/SEDATION: None  FLUOROSCOPY TIME:  None  COMPLICATIONS: None  PROCEDURE: Informed written consent was obtained from the patient after a discussion of the risks, benefits, and alternatives to treatment. Questions regarding the procedure were encouraged and answered. The right neck and chest were prepped with chlorhexidine in a sterile fashion, and a sterile drape was applied covering the operative field. Maximum barrier sterile technique with sterile gowns and gloves were used for the procedure. A timeout was performed prior to the initiation of the procedure.  After generous infiltration of the skin and subcutaneous tissues adjacent to the indwelling catheter, blunt dissection was used to free the catheter from the tract and the catheter was removed in its entirety.  A sterile dressing was placed.  The patient tolerated the procedure well and remained hemodynamically stable throughout.  No complications were encountered and no significant blood loss was encountered.  IMPRESSION: Status post bedside removal of tunneled hemodialysis catheter. Catheter removed in its entirety.  Signed,  Yvone Neu. Loreta Ave, DO  [facility]  [name]   Electronically Signed   By: Gilmer Mor D.O.   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 The patient is a very difficult airway and has dislodged his trach on more  more than one occasion. I reviewed the CXR myself, no evidence of pulmonary disease. Discussed with SSH-MD and PCCM-NP and during rounds with RT.  Respiratory failure: multi-factorial. - Diureses as able. - Pulmonary hygienes - Suction as needed. - Maintain on TC, keep trach in.  OSA: non-compliant with CPAP. - Keep trach in place. - Wt loss. - When closer to decannulation will consider CPAP and capping trach.  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 - Do not cap trach.  - No decannulation due to obesity and OSA.  Patient seen and examined, agree with above note. I dictated the care and orders written for this patient under my direction.  Alyson Reedy, MD [phone]

## 2015-03-01 LAB — PROTIME-INR
INR: 2.65 — ABNORMAL HIGH (ref 0.00–1.49)
Prothrombin Time: 27.8 seconds — ABNORMAL HIGH (ref 11.6–15.2)

## 2015-03-02 LAB — BASIC METABOLIC PANEL
Anion gap: 14 (ref 5–15)
BUN: 33 mg/dL — ABNORMAL HIGH (ref 6–20)
CO2: 29 mmol/L (ref 22–32)
Calcium: 10.3 mg/dL (ref 8.9–10.3)
Chloride: 91 mmol/L — ABNORMAL LOW (ref 101–111)
Creatinine, Ser: 1.34 mg/dL — ABNORMAL HIGH (ref 0.61–1.24)
GFR calc non Af Amer: 56 mL/min — ABNORMAL LOW (ref >60)
GLUCOSE: 115 mg/dL — AB (ref 65–99)
Potassium: 4.1 mmol/L (ref 3.5–5.1)
SODIUM: 134 mmol/L — AB (ref 135–145)

## 2015-03-02 LAB — CBC
HCT: 36.6 % — ABNORMAL LOW (ref 39.0–52.0)
HEMOGLOBIN: 11.7 g/dL — AB (ref 13.0–17.0)
MCH: 31 pg (ref 26.0–34.0)
MCHC: 32 g/dL (ref 30.0–36.0)
MCV: 96.8 fL (ref 78.0–100.0)
Platelets: 228 10*3/uL (ref 150–400)
RBC: 3.78 MIL/uL — ABNORMAL LOW (ref 4.22–5.81)
RDW: 17.8 % — AB (ref 11.5–15.5)
WBC: 7.6 10*3/uL (ref 4.0–10.5)

## 2015-03-02 LAB — PROTIME-INR
INR: 2.89 — AB (ref 0.00–1.49)
PROTHROMBIN TIME: 29.7 s — AB (ref 11.6–15.2)

## 2015-03-03 LAB — PROTIME-INR
INR: 3.16 — ABNORMAL HIGH (ref 0.00–1.49)
Prothrombin Time: 31.8 seconds — ABNORMAL HIGH (ref 11.6–15.2)

## 2015-03-04 LAB — HEMOGLOBIN AND HEMATOCRIT, BLOOD
HEMATOCRIT: 37 % — AB (ref 39.0–52.0)
Hemoglobin: 11.9 g/dL — ABNORMAL LOW (ref 13.0–17.0)

## 2015-03-04 LAB — RENAL FUNCTION PANEL
ANION GAP: 10 (ref 5–15)
Albumin: 3.4 g/dL — ABNORMAL LOW (ref 3.5–5.0)
BUN: 25 mg/dL — AB (ref 6–20)
CHLORIDE: 96 mmol/L — AB (ref 101–111)
CO2: 28 mmol/L (ref 22–32)
Calcium: 10 mg/dL (ref 8.9–10.3)
Creatinine, Ser: 1.13 mg/dL (ref 0.61–1.24)
GFR calc non Af Amer: >60 mL/min (ref >60)
GLUCOSE: 109 mg/dL — AB (ref 65–99)
POTASSIUM: 3.9 mmol/L (ref 3.5–5.1)
Phosphorus: 4.2 mg/dL (ref 2.5–4.6)
Sodium: 134 mmol/L — ABNORMAL LOW (ref 135–145)

## 2015-03-04 LAB — PROTIME-INR
INR: 3.14 — AB (ref 0.00–1.49)
PROTHROMBIN TIME: 31.7 s — AB (ref 11.6–15.2)

## 2015-03-05 DIAGNOSIS — J9601 Acute respiratory failure with hypoxia: Secondary | ICD-10-CM

## 2015-03-05 LAB — PROTIME-INR
INR: 3.18 — AB (ref 0.00–1.49)
Prothrombin Time: 32 seconds — ABNORMAL HIGH (ref 11.6–15.2)

## 2015-03-05 NOTE — Progress Notes (Signed)
PULMONARY / CRITICAL CARE MEDICINE   Name: [name] MRN: [id number] DOB: [birth year]    ADMISSION DATE:  02/14/2015  [facility]   CHIEF COMPLAINT:  Tracheostomy Dislodgement  INITIAL PRESENTATION: [name] initially admitted 4/22-5/14 for R rotator cuff repair by Ortho.  Failed extubation post-op and reintubated 4/23.  Post operative course complicated by prolonged respiratory respiratory failure, findings of testicular masses, acute renal failure requiring CVVHD, BLE DVT, and tracheostomy bleeding s/p revision.  [facility] and tracheostomy became dislodged, [facility] early am.  Post intubation he was noted to have significant trach site bleeding, and pneumomediastinum/Right Pneumothorax. He had been placed back on full vent support so this required serial CXRs, but resolved spontaneously. Course was notable for fever, leukocytosis and possible HCAP on 5/17 for which empiric cefepime and vanc.  [facility] to [facility]/24 and was progressing well, on ATC full time.  On 6/15 pt pulled trach out, RT unable to replace and pt was reintubated by anesthesia. PCCM re-consulted.   STUDIES:  4/23 US scrotum: Two solid extratesticular right scrotal masses. Primary differential diagnosis for extratesticular masses include primary or metastatic neoplasms including adenomatoid tumor, fibroma, leiomyoma, mesothelioma, or sarcoma. Urology consultation is recommended  4/26 TEE: LVEF 60-65%, Grade 1 DD, trivial pericardial effusion 4/27 US testes with masses 5/02 renal US: no hydronephrosis, Possible nonobstructing stone lower pole left kidney 5/23 CT head > NAICP  SIGNIFICANT EVENTS: 4/22 - 5/14  Admit for R rotator cuff repair, complicated course with prolonged resp fx, renal fx, DVT, testicular masses 5/17 re-do tracheostomy, HCAP 5/19 UFH gtt resumed 5/20 tolerating ATC. No bleeding with UFH gtt.  6/15 - pulled trach out, unable to replace per RT,  intubated by anesthesia  6/16 trach replaced per ENT 6/23 t collar 24/7  SUBJECTIVE:   NAD  VITAL SIGNS:   98.2 71 14 144/77 95%  VENTILATOR SETTINGS:     INTAKE / OUTPUT: No intake or output data in the 24 hours ending 03/05/15 1408  PHYSICAL EXAMINATION:  Gen:  NAD, awake and interactive HENT: trach-> t collar PULM: resps even non labored on tcollar, CTA B CV: RRR, no mgr GI: BS+, soft, nontender MSK: warm and dry, scant BLE edema, venous stasis  Derm: no skin breakdown Neuro: arouses to voice, RASS 1  LABS:  CBC  Recent Labs Lab 03/02/15 0420 03/04/15 0555  WBC 7.6  --   HGB 11.7* 11.9*  HCT 36.6* 37.0*  PLT 228  --    Coag's  Recent Labs Lab 03/03/15 0324 03/04/15 0555 03/05/15 0640  INR 3.16* 3.14* 3.18*     BMET  Recent Labs Lab 02/27/15 0630 03/02/15 0420 03/04/15 0555  NA 142 134* 134*  K 4.3 4.1 3.9  CL 95* 91* 96*  CO2 31 29 28   BUN 42* 33* 25*  CREATININE 1.54* 1.34* 1.13  GLUCOSE 133* 115* 109*   Electrolytes  Recent Labs Lab 02/27/15 0630 03/02/15 0420 03/04/15 0555  CALCIUM 10.8* 10.3 10.0  PHOS  --   --  4.2   Sepsis Markers No results for input(s): LATICACIDVEN, PROCALCITON, O2SATVEN in the last 168 hours.   ABG No results for input(s): PHART, PCO2ART, PO2ART in the last 168 hours. Liver Enzymes  Recent Labs Lab 03/04/15 0555  ALBUMIN 3.4*   Cardiac Enzymes No results for input(s): TROPONINI, PROBNP in the last 168 hours.   Glucose No results for input(s): GLUCAP in the last  168 hours.  CXR 6/16>>> Cardiomegaly with pulmonary vascular prominence and mild interstitial prominence consistent with congestive heart failure. No results found.     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 The patient is a very difficult airway and has dislodged his trach on more more than one occasion. I reviewed the CXR myself, no evidence of pulmonary disease. Discussed with SSH-MD and PCCM-NP and during rounds with RT.  Respiratory failure: multi-factorial. - Diureses as able. - Pulmonary hygienes - Suction as needed. - Maintain on TC, keep trach in.  OSA: non-compliant with CPAP. - Keep trach in place. - Wt loss. - When closer to decannulation will consider CPAP and capping trach.  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 - Do not cap trach.  - No decannulation due to obesity and OSA.  Do not anticipate decannulation anytime soon, needs to loose some significant weight prior to any consideration of that.  Arrange for follow up in the trach clinic post discharge and will consider decannulation then.  Alyson Reedy, M.D. [facility] Pulmonary/Critical Care Medicine. Pager: [phone]. After hours pager: [phone].

## 2015-03-06 LAB — PROTIME-INR
INR: 3.02 — ABNORMAL HIGH (ref 0.00–1.49)
PROTHROMBIN TIME: 30.7 s — AB (ref 11.6–15.2)

## 2015-03-07 LAB — CBC WITH DIFFERENTIAL/PLATELET
BASOS PCT: 1 % (ref 0–1)
Basophils Absolute: 0.1 10*3/uL (ref 0.0–0.1)
Eosinophils Absolute: 0 10*3/uL (ref 0.0–0.7)
Eosinophils Relative: 0 % (ref 0–5)
HCT: 34.4 % — ABNORMAL LOW (ref 39.0–52.0)
HEMOGLOBIN: 11.3 g/dL — AB (ref 13.0–17.0)
Lymphocytes Relative: 26 % (ref 12–46)
Lymphs Abs: 1.9 10*3/uL (ref 0.7–4.0)
MCH: 30.8 pg (ref 26.0–34.0)
MCHC: 32.8 g/dL (ref 30.0–36.0)
MCV: 93.7 fL (ref 78.0–100.0)
Monocytes Absolute: 0.8 10*3/uL (ref 0.1–1.0)
Monocytes Relative: 11 % (ref 3–12)
Neutro Abs: 4.7 10*3/uL (ref 1.7–7.7)
Neutrophils Relative %: 62 % (ref 43–77)
Platelets: 201 10*3/uL (ref 150–400)
RBC: 3.67 MIL/uL — ABNORMAL LOW (ref 4.22–5.81)
RDW: 17.4 % — ABNORMAL HIGH (ref 11.5–15.5)
WBC: 7.5 10*3/uL (ref 4.0–10.5)

## 2015-03-07 LAB — RENAL FUNCTION PANEL
Albumin: 3.2 g/dL — ABNORMAL LOW (ref 3.5–5.0)
Anion gap: 13 (ref 5–15)
BUN: 24 mg/dL — ABNORMAL HIGH (ref 6–20)
CO2: 30 mmol/L (ref 22–32)
CREATININE: 0.96 mg/dL (ref 0.61–1.24)
Calcium: 9.4 mg/dL (ref 8.9–10.3)
Chloride: 88 mmol/L — ABNORMAL LOW (ref 101–111)
Glucose, Bld: 116 mg/dL — ABNORMAL HIGH (ref 65–99)
PHOSPHORUS: 3.6 mg/dL (ref 2.5–4.6)
Potassium: 3.5 mmol/L (ref 3.5–5.1)
Sodium: 131 mmol/L — ABNORMAL LOW (ref 135–145)

## 2015-03-07 LAB — MAGNESIUM: Magnesium: 1.9 mg/dL (ref 1.7–2.4)

## 2015-03-07 LAB — PROTIME-INR
INR: 2.46 — AB (ref 0.00–1.49)
PROTHROMBIN TIME: 26.4 s — AB (ref 11.6–15.2)

## 2015-03-07 NOTE — Progress Notes (Signed)
PULMONARY / CRITICAL CARE MEDICINE   Name: [name] MRN: [id number] DOB: 1951/02/14    ADMISSION DATE:  02/14/2015  REFERRING MD :  [facility]   CHIEF COMPLAINT:  Tracheostomy Dislodgement  INITIAL PRESENTATION: [name] initially admitted 4/22-5/14 for R rotator cuff repair by Ortho.  Failed extubation post-op and reintubated 4/23.  Post operative course complicated by prolonged respiratory respiratory failure, findings of testicular masses, acute renal failure requiring CVVHD, BLE DVT, and tracheostomy bleeding s/p revision.  He was transferred to [facility] 5/14 PM and tracheostomy became dislodged, re-admitted to [facility] ICU 5/15 early am.  Post intubation he was noted to have significant trach site bleeding, and pneumomediastinum/Right Pneumothorax. He had been placed back on full vent support so this required serial CXRs, but resolved spontaneously. Course was notable for fever, leukocytosis and possible HCAP on 5/17 for which empiric cefepime and vanc.  Returned to [facility] 5/24 and was progressing well, on ATC full time.  On 6/15 pt pulled trach out, RT unable to replace and pt was reintubated by anesthesia. PCCM re-consulted.   STUDIES:  4/23 US scrotum: Two solid extratesticular right scrotal masses. Primary differential diagnosis for extratesticular masses include primary or metastatic neoplasms including adenomatoid tumor, fibroma, leiomyoma, mesothelioma, or sarcoma. Urology consultation is recommended  4/26 TEE: LVEF 60-65%, Grade 1 DD, trivial pericardial effusion 4/27 [place] testes with masses 5/02 renal US: no hydronephrosis, Possible nonobstructing stone lower pole left kidney 5/23 CT head > NAICP  SIGNIFICANT EVENTS: 4/22 - 5/14  Admit for R rotator cuff repair, complicated course with prolonged resp fx, renal fx, DVT, testicular masses 5/17 re-do tracheostomy, HCAP 5/19 UFH gtt resumed 5/20 tolerating ATC. No bleeding with UFH gtt.  6/15 - pulled trach out, unable to replace per RT,  intubated by anesthesia  6/16 trach replaced per ENT 6/23 t collar 24/7  SUBJECTIVE:   NAD  VITAL SIGNS:   98.2 71 14 144/77 95%  VENTILATOR SETTINGS:     INTAKE / OUTPUT: No intake or output data in the 24 hours ending 03/07/15 1412  PHYSICAL EXAMINATION:  Gen:  NAD, awake and interactive HENT: trach-> t collar PULM: resps even non labored on tcollar, CTA B CV: RRR, no mgr GI: BS+, soft, nontender MSK: warm and dry, scant BLE edema, venous stasis  Derm: no skin breakdown Neuro: arouses to voice, RASS 1  LABS:  CBC  Recent Labs Lab 03/02/15 0420 03/04/15 0555 03/07/15 0553  WBC 7.6  --  7.5  HGB 11.7* 11.9* 11.3*  HCT 36.6* 37.0* 34.4*  PLT 228  --  201   Coag's  Recent Labs Lab 03/05/15 0640 03/06/15 0450 03/07/15 0553  INR 3.18* 3.02* 2.46*     BMET  Recent Labs Lab 03/02/15 0420 03/04/15 0555 03/07/15 0553  NA 134* 134* 131*  K 4.1 3.9 3.5  CL 91* 96* 88*  CO2 BUN 33* 25* 24*  CREATININE 1.34* 1.13 0.96  GLUCOSE 115* 109* 116*   Electrolytes  Recent Labs Lab 03/02/15 0420 03/04/15 0555 03/07/15 0553  CALCIUM 10.3 10.0 9.4  MG  --   --  1.9  PHOS  --  4.2 3.6   Sepsis Markers No results for input(s): LATICACIDVEN, PROCALCITON, O2SATVEN in the last 168 hours.   ABG No results for input(s): PHART, PCO2ART, PO2ART in the last 168 hours. Liver Enzymes  Recent Labs Lab 03/04/15 0555 03/07/15 0553  ALBUMIN 3.4* 3.2*   Cardiac Enzymes No results for input(s): TROPONINI, PROBNP  in the last 168 hours.   Glucose No results for input(s): GLUCAP in the last 168 hours.  CXR 6/16>>> Cardiomegaly with pulmonary vascular prominence and mild interstitial prominence consistent with congestive heart failure. No results found.     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 The patient is a very difficult airway and has dislodged his trach on more more than one occasion. I reviewed the CXR myself, no evidence of pulmonary disease. Discussed with SSH-MD and PCCM-NP and during rounds with RT.  Respiratory failure: multi-factorial. - Diureses as able. - Pulmonary hygienes - Suction as needed. - Maintain on TC, keep trach in.  OSA: non-compliant with CPAP. - Keep trach in place. - Wt loss. - When closer to decannulation will consider CPAP and capping trach.  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 - May cap trach during the day and uncap at night for OSA.  - No decannulation due to obesity and OSA.  Do not anticipate decannulation anytime soon, needs to loose some significant weight prior to any consideration of that.  Arrange for follow up in the trach clinic post discharge and will consider decannulation then.  PCCM will sign off, please call back if needed.  Alyson ReedyWesam G. Sadarius Norman, M.D. [facility]. Pager: [phone]. After hours pager: [phone].

## 2015-03-08 LAB — PROTIME-INR
INR: 2.14 — AB (ref 0.00–1.49)
PROTHROMBIN TIME: 23.8 s — AB (ref 11.6–15.2)

## 2015-03-09 LAB — BASIC METABOLIC PANEL
Anion gap: 13 (ref 5–15)
BUN: 20 mg/dL (ref 6–20)
CHLORIDE: 89 mmol/L — AB (ref 101–111)
CO2: 30 mmol/L (ref 22–32)
Calcium: 9.2 mg/dL (ref 8.9–10.3)
Creatinine, Ser: 0.92 mg/dL (ref 0.61–1.24)
GFR calc non Af Amer: >60 mL/min (ref >60)
GLUCOSE: 105 mg/dL — AB (ref 65–99)
Potassium: 3.1 mmol/L — ABNORMAL LOW (ref 3.5–5.1)
SODIUM: 132 mmol/L — AB (ref 135–145)

## 2015-03-09 LAB — PROTIME-INR
INR: 2.17 — ABNORMAL HIGH (ref 0.00–1.49)
PROTHROMBIN TIME: 24 s — AB (ref 11.6–15.2)

## 2015-03-09 LAB — MAGNESIUM: Magnesium: 2 mg/dL (ref 1.7–2.4)

## 2015-03-10 LAB — PROTIME-INR
INR: 2.16 — AB (ref 0.00–1.49)
Prothrombin Time: 23.9 seconds — ABNORMAL HIGH (ref 11.6–15.2)

## 2015-03-10 LAB — POTASSIUM: Potassium: 3.5 mmol/L (ref 3.5–5.1)

## 2015-03-11 LAB — CBC WITH DIFFERENTIAL/PLATELET
BASOS ABS: 0.1 10*3/uL (ref 0.0–0.1)
BASOS PCT: 1 % (ref 0–1)
EOS PCT: 0 % (ref 0–5)
Eosinophils Absolute: 0 10*3/uL (ref 0.0–0.7)
HCT: 35.9 % — ABNORMAL LOW (ref 39.0–52.0)
Hemoglobin: 11.8 g/dL — ABNORMAL LOW (ref 13.0–17.0)
Lymphocytes Relative: 28 % (ref 12–46)
Lymphs Abs: 2.1 10*3/uL (ref 0.7–4.0)
MCH: 30.9 pg (ref 26.0–34.0)
MCHC: 32.9 g/dL (ref 30.0–36.0)
MCV: 94 fL (ref 78.0–100.0)
Monocytes Absolute: 0.8 10*3/uL (ref 0.1–1.0)
Monocytes Relative: 10 % (ref 3–12)
NEUTROS ABS: 4.8 10*3/uL (ref 1.7–7.7)
Neutrophils Relative %: 61 % (ref 43–77)
Platelets: 202 10*3/uL (ref 150–400)
RBC: 3.82 MIL/uL — ABNORMAL LOW (ref 4.22–5.81)
RDW: 17.2 % — ABNORMAL HIGH (ref 11.5–15.5)
WBC: 7.8 10*3/uL (ref 4.0–10.5)

## 2015-03-11 LAB — BASIC METABOLIC PANEL
ANION GAP: 12 (ref 5–15)
BUN: 23 mg/dL — ABNORMAL HIGH (ref 6–20)
CALCIUM: 9.5 mg/dL (ref 8.9–10.3)
CHLORIDE: 91 mmol/L — AB (ref 101–111)
CO2: 30 mmol/L (ref 22–32)
Creatinine, Ser: 0.97 mg/dL (ref 0.61–1.24)
GFR calc Af Amer: >60 mL/min (ref >60)
GFR calc non Af Amer: >60 mL/min (ref >60)
GLUCOSE: 110 mg/dL — AB (ref 65–99)
Potassium: 3.5 mmol/L (ref 3.5–5.1)
Sodium: 133 mmol/L — ABNORMAL LOW (ref 135–145)

## 2015-03-11 LAB — PROTIME-INR
INR: 2.15 — AB (ref 0.00–1.49)
Prothrombin Time: 23.9 seconds — ABNORMAL HIGH (ref 11.6–15.2)

## 2015-03-12 LAB — PROTIME-INR
INR: 2.12 — ABNORMAL HIGH (ref 0.00–1.49)
Prothrombin Time: 23.6 seconds — ABNORMAL HIGH (ref 11.6–15.2)

## 2015-03-13 LAB — PROTIME-INR
INR: 2.04 — AB (ref 0.00–1.49)
Prothrombin Time: 22.9 seconds — ABNORMAL HIGH (ref 11.6–15.2)

## 2015-03-14 LAB — RENAL FUNCTION PANEL
ALBUMIN: 3.2 g/dL — AB (ref 3.5–5.0)
Anion gap: 13 (ref 5–15)
BUN: 20 mg/dL (ref 6–20)
CHLORIDE: 91 mmol/L — AB (ref 101–111)
CO2: 31 mmol/L (ref 22–32)
CREATININE: 0.82 mg/dL (ref 0.61–1.24)
Calcium: 9.4 mg/dL (ref 8.9–10.3)
GFR calc Af Amer: >60 mL/min (ref >60)
GFR calc non Af Amer: >60 mL/min (ref >60)
Glucose, Bld: 112 mg/dL — ABNORMAL HIGH (ref 65–99)
Phosphorus: 3.7 mg/dL (ref 2.5–4.6)
Potassium: 3 mmol/L — ABNORMAL LOW (ref 3.5–5.1)
Sodium: 135 mmol/L (ref 135–145)

## 2015-03-14 LAB — CBC WITH DIFFERENTIAL/PLATELET
BASOS ABS: 0.1 10*3/uL (ref 0.0–0.1)
Basophils Relative: 1 % (ref 0–1)
EOS ABS: 0 10*3/uL (ref 0.0–0.7)
EOS PCT: 0 % (ref 0–5)
HCT: 36.3 % — ABNORMAL LOW (ref 39.0–52.0)
Hemoglobin: 12.1 g/dL — ABNORMAL LOW (ref 13.0–17.0)
LYMPHS PCT: 24 % (ref 12–46)
Lymphs Abs: 2 10*3/uL (ref 0.7–4.0)
MCH: 31 pg (ref 26.0–34.0)
MCHC: 33.3 g/dL (ref 30.0–36.0)
MCV: 93.1 fL (ref 78.0–100.0)
Monocytes Absolute: 0.8 10*3/uL (ref 0.1–1.0)
Monocytes Relative: 10 % (ref 3–12)
NEUTROS PCT: 65 % (ref 43–77)
Neutro Abs: 5.5 10*3/uL (ref 1.7–7.7)
PLATELETS: 251 10*3/uL (ref 150–400)
RBC: 3.9 MIL/uL — ABNORMAL LOW (ref 4.22–5.81)
RDW: 16.8 % — AB (ref 11.5–15.5)
WBC: 8.5 10*3/uL (ref 4.0–10.5)

## 2015-03-14 LAB — MAGNESIUM: Magnesium: 2 mg/dL (ref 1.7–2.4)

## 2015-03-14 LAB — POTASSIUM: Potassium: 3.7 mmol/L (ref 3.5–5.1)

## 2015-03-14 LAB — PROTIME-INR
INR: 2.04 — ABNORMAL HIGH (ref 0.00–1.49)
Prothrombin Time: 22.9 seconds — ABNORMAL HIGH (ref 11.6–15.2)

## 2015-03-15 LAB — PROTIME-INR
INR: 2.17 — ABNORMAL HIGH (ref 0.00–1.49)
Prothrombin Time: 24 seconds — ABNORMAL HIGH (ref 11.6–15.2)

## 2015-03-16 LAB — PROTIME-INR
INR: 2.16 — ABNORMAL HIGH (ref 0.00–1.49)
Prothrombin Time: 23.9 seconds — ABNORMAL HIGH (ref 11.6–15.2)

## 2015-03-17 LAB — PROTIME-INR
INR: 2.05 — AB (ref 0.00–1.49)
Prothrombin Time: 23 seconds — ABNORMAL HIGH (ref 11.6–15.2)

## 2015-03-18 LAB — PROTIME-INR
INR: 1.95 — ABNORMAL HIGH (ref 0.00–1.49)
Prothrombin Time: 22.2 seconds — ABNORMAL HIGH (ref 11.6–15.2)

## 2015-03-18 LAB — POTASSIUM: Potassium: 3.3 mmol/L — ABNORMAL LOW (ref 3.5–5.1)

## 2015-03-19 LAB — BASIC METABOLIC PANEL
Anion gap: 11 (ref 5–15)
BUN: 23 mg/dL — ABNORMAL HIGH (ref 6–20)
CO2: 33 mmol/L — ABNORMAL HIGH (ref 22–32)
CREATININE: 0.98 mg/dL (ref 0.61–1.24)
Calcium: 9.7 mg/dL (ref 8.9–10.3)
Chloride: 92 mmol/L — ABNORMAL LOW (ref 101–111)
GFR calc Af Amer: >60 mL/min (ref >60)
Glucose, Bld: 110 mg/dL — ABNORMAL HIGH (ref 65–99)
Potassium: 3.2 mmol/L — ABNORMAL LOW (ref 3.5–5.1)
Sodium: 136 mmol/L (ref 135–145)

## 2015-03-19 LAB — PROTIME-INR
INR: 1.92 — AB (ref 0.00–1.49)
PROTHROMBIN TIME: 21.9 s — AB (ref 11.6–15.2)

## 2015-03-20 LAB — POTASSIUM: Potassium: 3.8 mmol/L (ref 3.5–5.1)

## 2015-03-20 LAB — PROTIME-INR
INR: 1.91 — AB (ref 0.00–1.49)
Prothrombin Time: 21.8 seconds — ABNORMAL HIGH (ref 11.6–15.2)

## 2015-03-21 LAB — PROTIME-INR
INR: 2.01 — AB (ref 0.00–1.49)
PROTHROMBIN TIME: 22.7 s — AB (ref 11.6–15.2)

## 2015-03-22 LAB — PROTIME-INR
INR: 2.04 — AB (ref 0.00–1.49)
Prothrombin Time: 22.9 seconds — ABNORMAL HIGH (ref 11.6–15.2)

## 2015-03-23 LAB — PROTIME-INR
INR: 1.85 — ABNORMAL HIGH (ref 0.00–1.49)
PROTHROMBIN TIME: 21.3 s — AB (ref 11.6–15.2)

## 2015-03-24 LAB — PROTIME-INR
INR: 2.1 — ABNORMAL HIGH (ref 0.00–1.49)
Prothrombin Time: 23.4 seconds — ABNORMAL HIGH (ref 11.6–15.2)

## 2015-03-25 ENCOUNTER — Encounter: Payer: Self-pay | Admitting: Adult Health

## 2015-03-25 ENCOUNTER — Non-Acute Institutional Stay: Payer: PRIVATE HEALTH INSURANCE | Admitting: Adult Health

## 2015-03-25 DIAGNOSIS — I5031 Acute diastolic (congestive) heart failure: Secondary | ICD-10-CM

## 2015-03-25 DIAGNOSIS — I82403 Acute embolism and thrombosis of unspecified deep veins of lower extremity, bilateral: Secondary | ICD-10-CM | POA: Diagnosis not present

## 2015-03-25 DIAGNOSIS — J9611 Chronic respiratory failure with hypoxia: Secondary | ICD-10-CM | POA: Diagnosis not present

## 2015-03-25 DIAGNOSIS — E662 Morbid (severe) obesity with alveolar hypoventilation: Secondary | ICD-10-CM

## 2015-03-25 DIAGNOSIS — Z930 Tracheostomy status: Secondary | ICD-10-CM

## 2015-03-25 DIAGNOSIS — F418 Other specified anxiety disorders: Secondary | ICD-10-CM

## 2015-03-25 DIAGNOSIS — R5381 Other malaise: Secondary | ICD-10-CM | POA: Diagnosis not present

## 2015-03-25 LAB — CBC
HEMATOCRIT: 36.4 % — AB (ref 39.0–52.0)
HEMOGLOBIN: 11.7 g/dL — AB (ref 13.0–17.0)
MCH: 30.5 pg (ref 26.0–34.0)
MCHC: 32.1 g/dL (ref 30.0–36.0)
MCV: 95 fL (ref 78.0–100.0)
Platelets: 240 10*3/uL (ref 150–400)
RBC: 3.83 MIL/uL — ABNORMAL LOW (ref 4.22–5.81)
RDW: 16.5 % — ABNORMAL HIGH (ref 11.5–15.5)
WBC: 8.5 10*3/uL (ref 4.0–10.5)

## 2015-03-25 LAB — BASIC METABOLIC PANEL
ANION GAP: 13 (ref 5–15)
BUN: 20 mg/dL (ref 6–20)
CHLORIDE: 95 mmol/L — AB (ref 101–111)
CO2: 30 mmol/L (ref 22–32)
Calcium: 9.7 mg/dL (ref 8.9–10.3)
Creatinine, Ser: 1.01 mg/dL (ref 0.61–1.24)
GFR calc non Af Amer: >60 mL/min (ref >60)
GLUCOSE: 111 mg/dL — AB (ref 65–99)
POTASSIUM: 3.4 mmol/L — AB (ref 3.5–5.1)
Sodium: 138 mmol/L (ref 135–145)

## 2015-03-25 LAB — PROTIME-INR
INR: 1.88 — ABNORMAL HIGH (ref 0.00–1.49)
Prothrombin Time: 21.6 seconds — ABNORMAL HIGH (ref 11.6–15.2)

## 2015-03-25 NOTE — Progress Notes (Signed)
Patient ID: [name], male   DOB: 1951-06-04, 64 y.o.   MRN: [id number]  [name] living [place]      No Known Allergies     Chief Complaint  Patient presents with  . Hospitalization Follow-up    HPI:  He is an unfortunate man; who had a rotator cuff repair; and had many complications afterward. He is morbidly obese smoker with copd. He developed acute respiratory failure; had difficulty getting off the ventilator; had tracheostomy; it had to be replaced twice; the first due to bleeding and the second as he pulled it out. He developed pneumonia; sepsis.  He has no memory of the past two months. He is here for short term rehab with his goal to return back home. More than likely he should be here for several weeks.    Past Medical History  Diagnosis Date  . Arthritis   . RCT (rotator cuff tear)   . Multiple abrasions     rt arm  . Morbid obesity     Past Surgical History  Procedure Laterality Date  . No past surgeries    . Shoulder arthroscopy with subacromial decompression, rotator cuff repair and bicep tendon repair Right 12/28/2014    Procedure: RIGHT SHOULDER ARTHROSCOPY WITH SUBACROMIAL DECOMPRESSION, DISTAL CLAVICLE RESECTION, ROTATOR CUFF REPAIR ;  Surgeon: Eugenia Mcalpine, MD;  Location: [facility];  Service: Orthopedics;  Laterality: Right;  . Tracheostomy tube placement N/A 01/16/2015    Procedure: TRACHEOSTOMY;  Surgeon: Flo Shanks, MD;  Location: [facility] OR;  Service: ENT;  Laterality: N/A;  . Tracheostomy revision N/A 01/18/2015    Procedure: TRACHEOSTOMY REVISION;  Surgeon: Melvenia Beam, MD;  Location: [facility] OR;  Service: ENT;  Laterality: N/A;  . Tracheostomy tube placement N/A 01/22/2015    Procedure: TRACHEOSTOMY;  Surgeon: Flo Shanks, MD;  Location: [facility] OR;  Service: ENT;  Laterality: N/A;    VITAL SIGNS BP 138/80 mmHg  Pulse 79  Ht  (1.803 m)  Wt 289 lb (131.09 kg)  BMI 40.33 kg/m2  SpO2 98%  Patient's Medications  New Prescriptions   No  medications on file  Previous Medications   AMINO ACIDS-PROTEIN HYDROLYS (FEEDING SUPPLEMENT, PRO-STAT SUGAR FREE 64,) LIQD    Take 30 mLs by mouth 2 (two) times daily.   CLONAZEPAM (KLONOPIN) 0.5 MG TABLET    Take 0.5 mg by mouth 3 (three) times daily.   FAMOTIDINE (PEPCID) 20 MG TABLET    Take 20 mg by mouth daily.   FOLIC ACID (FOLVITE) 1 MG TABLET    Take 1 mg by mouth daily.   FUROSEMIDE (LASIX) 40 MG TABLET    Take 40 mg by mouth daily.   HYDRALAZINE (APRESOLINE) 25 MG TABLET    Take 37.5 mg by mouth 2 (two) times daily.   ISOSORBIDE DINITRATE (ISORDIL) 20 MG TABLET    Take 20 mg by mouth 2 (two) times daily.   LIDOCAINE (LIDODERM) 5 %    Place 2 patches onto the skin daily. Remove & Discard patch within 12 hours or as directed by MD   MELATONIN 3 MG TABS    Take 9 mg by mouth at bedtime.   METOCLOPRAMIDE (REGLAN) 10 MG TABLET    Take 10 mg by mouth 4 (four) times daily -  before meals and at bedtime.   METOLAZONE (ZAROXOLYN) 5 MG TABLET    Take 5 mg by mouth daily.   MODAFINIL (PROVIGIL) 100 MG TABLET    Take 100 mg by mouth daily.  OMEGA-3 FATTY ACIDS (FISH OIL) 1000 MG CAPS    Take 8,000 mg by mouth daily.   ONDANSETRON (ZOFRAN) 4 MG TABLET    Take 4 mg by mouth every 6 (six) hours.   POLYCARBOPHIL (FIBERCON) 625 MG TABLET    Take 1,250 mg by mouth 3 (three) times daily.   POLYETHYLENE GLYCOL (MIRALAX / GLYCOLAX) PACKET    Take 17 g by mouth daily.   POTASSIUM CHLORIDE SA (K-DUR,KLOR-CON) 20 MEQ TABLET    Take 40 mEq by mouth 2 (two) times daily.   SERTRALINE (ZOLOFT) 100 MG TABLET    Take 100 mg by mouth daily.   WARFARIN (COUMADIN) 5 MG TABLET    Take 5 mg by mouth daily at 6 PM.  Modified Medications   No medications on file  Discontinued Medications     SIGNIFICANT DIAGNOSTIC EXAMS  12-29-14: testicle ultrasound: Two solid extratesticular right scrotal masses. Primary differential diagnosis for extratesticular masses include primary or metastatic neoplasms including  adenomatoid tumor, fibroma, leiomyoma, mesothelioma, or sarcoma. Urology consultation is recommended. No sonographic evidence for testicular torsion or intratesticular mass.  01-01-15: 2-d echo: Left ventricle: The cavity size was normal. There was mild focal basal and mild concentric hypertrophy of the septum. Systolic function was normal. The estimated ejection fraction was in the range of 60% to 65%. Wall motion was normal; there were no regional wall motion abnormalities. Doppler parameters are consistent with abnormal left ventricular relaxation (grade 1 diastolic dysfunction). There was no evidence of elevate ventricular filling pressure by Doppler parameters. - Aortic valve: There was no regurgitation. - Aortic root: The aortic root was normal in size. - Mitral valve: Structurally normal valve. There was no regurgitation. - Right ventricle: Systolic function was normal. - Right atrium: The atrium was normal in size. - Tricuspid valve: There was trivial regurgitation. - Pulmonary arteries: Systolic pressure was within the normal range.   01-07-15: renal ultrasound: Limited examination demonstrating no acute abnormality. Negative for Hydronephrosis. Possible nonobstructing stone lower pole left kidney.  01-18-15: IVC filter placement: Successful IVC filter placement. This is temporary or can remain in place to become permanent.  01-28-15: ct of abdomen: 1. No significant colonic interposition between the anterior abdominal wall and stomach. 2. Hepatic steatosis. 3. Bilateral nephrolithiasis. Possible left UPJ stenosis without secondary signs of ureteral obstruction. 4. Cardiomegaly, bibasilar atelectasis and a small amount of retrosternal air, likely related to previous thoracic intervention.   01-28-15: ct of head: 1. No acute intracranial findings demonstrated. Mild atrophy and periventricular white matter disease. 2. Bilateral mastoid effusions with opacification of the left middle  ear.   LABS REVIEWED:   12-31-14: hgb a1c 7.5; 01-23-15: hgb a1c 7.2  01-31-15: vit b12: 304 02-13-15: hgb a1c 6.3 03-14-15: wbc 8.5; hgb 12.;1 hct 36.3; mcv 93.1; plt 251; glucose 112; bun 20; creat 0.82; k+3.0; na++135; phos 3.7; mag 1.2; albumin 3.2 03-25-15: wbc 8.5; hgb 11.7; hct 36.4; mcv 95.0; plt 240; glucose 111; bun 20; creat 1.01; k+3.4; na++138     Review of Systems  Constitutional: Negative for appetite change and fatigue.  HENT: Negative for congestion.   Respiratory: Negative for cough, chest tightness and shortness of breath.   Cardiovascular: Negative for chest pain, palpitations and leg swelling.  Gastrointestinal: Negative for nausea, abdominal pain, diarrhea and constipation.  Musculoskeletal: Negative for myalgias and arthralgias.  Skin: Negative for pallor.  Neurological: Negative for dizziness.  Psychiatric/Behavioral: The patient is not nervous/anxious.       Physical Exam  Constitutional: He is oriented to person, place, and time. No distress.  Eyes: Conjunctivae are normal.  Neck: Neck supple. No JVD present. No thyromegaly present.  Cardiovascular: Normal rate, regular rhythm and intact distal pulses.   Respiratory: Effort normal and breath sounds normal. No respiratory distress. He has no wheezes.  Has capped trach   GI: Soft. Bowel sounds are normal. He exhibits no distension. There is no tenderness.  Peg tube present   Musculoskeletal: He exhibits no edema.  Able to move all extremities   Lymphadenopathy:    He has no cervical adenopathy.  Neurological: He is alert and oriented to person, place, and time.  Skin: Skin is warm and dry. He is not diaphoretic.  Psychiatric: He has a normal mood and affect.       ASSESSMENT/ PLAN:  1. Chronic pain: his pain is presently being managed with lidoderm patches X2; will not make changes will monitor his status   2. COPD; chronic respiratory failure; obesity hypoventilation syndrome:   is presenlty  stable is trach dependent is presently not on medications; will not make changes   3. Diastolic heart failure: will continue lasix 40 mg daily zaroxolyn 5 mg daily   4. Hypokalemia: will continue k+ 40 meq twice daily    5. Hypertension: will continue hydralazine 37.5 mg twice daily; isordil 20 mg twice daily   6. Genella RifeGerd: will continue reglan 10 mg four times daily and pepcid 20 mg daily   7. Constipation: will continue miralax daily ; fibercon 2 tabs three times daily   8. Depression with anxiety: will continue zoloft 100 mg daily; provigil 100 mg daily; and will continue melatonin 9 mg nightly for sleep  9. Chronic pain syndrome: will continue lidoderm patches X2; will monitor  10.  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   11. Status post right rotator cuff repair: will continue therapy as directed to improve function of his shoulder  12. Physical deconditioning: will continue therapy as directed to improve upon his strength; balance; gait and independence with adl's.   Will lower fish oil to 1 gm daily; will check bmp in one week  Time spent with patient 50   minutes >50% time spent counseling; reviewing medical record; tests; labs; and developing future plan of care     Synthia Innocenteborah Green NP [facility]  Contact [phone] Monday through Friday 8am- 5pm  After hours call [phone]

## 2015-03-26 ENCOUNTER — Non-Acute Institutional Stay (SKILLED_NURSING_FACILITY): Payer: PRIVATE HEALTH INSURANCE | Admitting: Internal Medicine

## 2015-03-26 ENCOUNTER — Encounter: Payer: Self-pay | Admitting: Internal Medicine

## 2015-03-26 DIAGNOSIS — J9611 Chronic respiratory failure with hypoxia: Secondary | ICD-10-CM

## 2015-03-26 DIAGNOSIS — I82403 Acute embolism and thrombosis of unspecified deep veins of lower extremity, bilateral: Secondary | ICD-10-CM

## 2015-03-26 DIAGNOSIS — R5381 Other malaise: Secondary | ICD-10-CM

## 2015-03-26 DIAGNOSIS — Z930 Tracheostomy status: Secondary | ICD-10-CM

## 2015-03-26 DIAGNOSIS — F418 Other specified anxiety disorders: Secondary | ICD-10-CM

## 2015-03-26 DIAGNOSIS — J449 Chronic obstructive pulmonary disease, unspecified: Secondary | ICD-10-CM

## 2015-03-26 DIAGNOSIS — I10 Essential (primary) hypertension: Secondary | ICD-10-CM

## 2015-03-26 DIAGNOSIS — E11628 Type 2 diabetes mellitus with other skin complications: Secondary | ICD-10-CM | POA: Insufficient documentation

## 2015-03-26 DIAGNOSIS — Z720 Tobacco use: Secondary | ICD-10-CM

## 2015-03-26 DIAGNOSIS — E662 Morbid (severe) obesity with alveolar hypoventilation: Secondary | ICD-10-CM

## 2015-03-26 DIAGNOSIS — K4090 Unilateral inguinal hernia, without obstruction or gangrene, not specified as recurrent: Secondary | ICD-10-CM

## 2015-03-26 DIAGNOSIS — E440 Moderate protein-calorie malnutrition: Secondary | ICD-10-CM

## 2015-03-26 NOTE — Progress Notes (Signed)
Patient ID: [name], male   DOB: [birth year], 64 y.o.   MRN: [id number]    HISTORY AND PHYSICAL   DATE: 03/26/15  Location:  [place] of Service: SNF [phone])   Extended Emergency Contact Information Primary Emergency Contact: Wedig,William Address: [address]          [place], [place] [address] Johnnette Litter of [place] Phone: [phone] Mobile Phone: [phone] Relation: Brother Secondary Emergency Contact: Sneed,Charles Address: [address], [place] [address] Johnnette Litter of [place] Phone: [phone] Mobile Phone: [phone] Relation: Friend  Advanced Directive information  FULL CODE  Chief Complaint  Patient presents with  . New Admit To SNF    HPI:  64 yo male seen today as a new admission into SNF transferred from [place] following hospital stay for right rotator cuff repair complicated by postop VDRF and is s/p trach and Peg. He was treated for bleeding from trach during his stay at [facility]. He was able to weaned from vent but trach collar remains. He is now on RA. During hospital stay he also developed b/l DVT and IVC filter placed. He is taking coumadin and is therapeutic. Encephalopathy resolved. He now is present to continue rehab in short term facility.  He c/o intermittent SOB and chest congestion. Pain is well controlled. He is tolerating diet. He is taking po food. States he does get choked occasionally and has intermittent dysphagia but does not want TF at this time. No f/c. Sleeping well. He has not begun PT yet.  Mood stable on klonopin and zoloft  He takes provigil for OHS  He has a nicotine patch due to smoking hx. He has COPD  Edema and BP controlled on lasix, zaroxolyn. He also takes isosorbide and hydralazine.   Pain stable on lidocaine patch  He is a new diabetic. No CBGs taken yet.  He takes several vitamin and mineral supplements. He is taking nutritional supplements as  ordered. Last albumin 2.8  Past Medical History  Diagnosis Date  . Arthritis   . RCT (rotator cuff tear)   . Multiple abrasions     rt arm  . Morbid obesity   . Hypertension   . Hyperlipidemia   . COPD (chronic obstructive pulmonary disease)   . CHF (congestive heart failure)     Past Surgical History  Procedure Laterality Date  . No past surgeries    . Shoulder arthroscopy with subacromial decompression, rotator cuff repair and bicep tendon repair Right 12/28/2014    Procedure: RIGHT SHOULDER ARTHROSCOPY WITH SUBACROMIAL DECOMPRESSION, DISTAL CLAVICLE RESECTION, ROTATOR CUFF REPAIR ;  Surgeon: Sydnee Cabal, MD;  Location: [facility];  Service: Orthopedics;  Laterality: Right;  . Tracheostomy tube placement N/A 01/16/2015    Procedure: TRACHEOSTOMY;  Surgeon: Jodi Marble, MD;  Location: [facility];  Service: ENT;  Laterality: N/A;  . Tracheostomy revision N/A 01/18/2015    Procedure: TRACHEOSTOMY REVISION;  Surgeon: Ruby Cola, MD;  Location: [facility];  Service: ENT;  Laterality: N/A;  . Tracheostomy tube placement N/A 01/22/2015    Procedure: TRACHEOSTOMY;  Surgeon: Jodi Marble, MD;  Location: [facility];  Service: ENT;  Laterality: N/A;    No care team member to display  History   Social History  . Marital Status: Single    Spouse Name: N/A  . Number of Children: N/A  . Years of Education: N/A   Occupational History  . Not  on file.   Social History Main Topics  . Smoking status: Qualls Every Day Smoker  . Smokeless tobacco: Not on file  . Alcohol Use: No  . Drug Use: No  . Sexual Activity: Not on file   Other Topics Concern  . Not on file   Social History Narrative     reports that he has been smoking.  He does not have any smokeless tobacco history on file. He reports that he does not drink alcohol or use illicit drugs.  Family History  Problem Relation Age of Onset  . Heart disease Mother   . Heart attack Mother   . Heart disease Brother    Family Status    Relation Status Death Age  . Mother Deceased   . Father Deceased   . Sister Alive   . Brother Alive   . Brother Deceased     Immunization History  Administered Date(s) Administered  . Pneumococcal Polysaccharide-23 01/14/2015    No Known Allergies  Medications: Patient's Medications  New Prescriptions   No medications on file  Previous Medications   AMINO ACIDS-PROTEIN HYDROLYS (FEEDING SUPPLEMENT, PRO-STAT SUGAR FREE 64,) LIQD    Take 30 mLs by mouth 2 (two) times daily.   CLONAZEPAM (KLONOPIN) 0.5 MG TABLET    Take 0.5 mg by mouth 3 (three) times daily.   FAMOTIDINE (PEPCID) 20 MG TABLET    Take 20 mg by mouth daily.   FOLIC ACID (FOLVITE) 1 MG TABLET    Take 1 mg by mouth daily.   FUROSEMIDE (LASIX) 40 MG TABLET    Take 40 mg by mouth daily.   HYDRALAZINE (APRESOLINE) 25 MG TABLET    Take 37.5 mg by mouth 2 (two) times daily.   ISOSORBIDE DINITRATE (ISORDIL) 20 MG TABLET    Take 20 mg by mouth 2 (two) times daily.   LIDOCAINE (LIDODERM) 5 %    Place 2 patches onto the skin daily. Remove & Discard patch within 12 hours or as directed by MD   MELATONIN 3 MG TABS    Take 9 mg by mouth at bedtime.   METOCLOPRAMIDE (REGLAN) 10 MG TABLET    Take 10 mg by mouth 4 (four) times daily -  before meals and at bedtime.   METOLAZONE (ZAROXOLYN) 5 MG TABLET    Take 5 mg by mouth daily.   MODAFINIL (PROVIGIL) 100 MG TABLET    Take 100 mg by mouth daily.   OMEGA-3 FATTY ACIDS (FISH OIL) 1000 MG CAPS    Take 8,000 mg by mouth daily.   ONDANSETRON (ZOFRAN) 4 MG TABLET    Take 4 mg by mouth every 6 (six) hours.   POLYCARBOPHIL (FIBERCON) 625 MG TABLET    Take 1,250 mg by mouth 3 (three) times daily.   POLYETHYLENE GLYCOL (MIRALAX / GLYCOLAX) PACKET    Take 17 g by mouth daily.   POTASSIUM CHLORIDE SA (K-DUR,KLOR-CON) 20 MEQ TABLET    Take 40 mEq by mouth 2 (two) times daily.   SERTRALINE (ZOLOFT) 100 MG TABLET    Take 100 mg by mouth daily.   WARFARIN (COUMADIN) 5 MG TABLET    Take 5 mg by  mouth daily at 6 PM.  Modified Medications   No medications on file  Discontinued Medications   No medications on file    Review of Systems  Unable to perform ROS: Psychiatric disorder    Filed Vitals:   03/26/15 1515  BP: 136/66  Pulse: 84  Temp: 99.9  F (37.7 C)  SpO2: 95%   There is no weight on file to calculate BMI.  Physical Exam  Constitutional: He is oriented to person, place, and time. He appears well-developed and well-nourished. No distress.  Morbidly obese. Lying in bed  HENT:  Mouth/Throat: Oropharynx is clear and moist.  Eyes: Pupils are equal, round, and reactive to light. No scleral icterus.  Neck: Neck supple. Carotid bruit is not present. No thyromegaly present.    Cardiovascular: Normal rate, regular rhythm, normal heart sounds and intact distal pulses.  Exam reveals no gallop and no friction rub.   No murmur heard. Trace distal LE swelling. No calf TTP. Chronic venous stasis changes  Pulmonary/Chest: Effort normal. He has decreased breath sounds. He has no wheezes. He has no rhonchi. He has no rales. He exhibits no tenderness.  Congested BS that clear with cough R>L base  Abdominal: Soft. Bowel sounds are normal. He exhibits no distension, no abdominal bruit, no pulsatile midline mass and no mass. There is no tenderness. There is no rebound and no guarding.    Lymphadenopathy:    He has no cervical adenopathy.  Neurological: He is alert and oriented to person, place, and time. He has normal reflexes.  Skin: Skin is warm and dry. No rash noted.  Psychiatric: He has a normal mood and affect. His behavior is normal. Judgment and thought content normal.   Diabetic Foot Exam - Simple   Simple Foot Form  Visual Inspection  See comments:  Yes  Sensation Testing  Pulse Check  Posterior Tibialis and Dorsalis pulse intact bilaterally:  Yes  Comments  Open vesicle left plantar surface with clean base. No secondary signs of infection. B/l toenail dystrophy  with thickened appearance       Labs reviewed: Admission on 02/14/2015, Discharged on 03/25/2015  No results displayed because visit has over 200 results.    Admission on 01/29/2015, Discharged on 02/14/2015  Component Date Value Ref Range Status  . Heparin Unfractionated 01/29/2015 <0.10* 0.30 - 0.70 IU/mL Final   Comment:        IF HEPARIN RESULTS ARE BELOW EXPECTED VALUES, AND PATIENT DOSAGE HAS BEEN CONFIRMED, SUGGEST FOLLOW UP TESTING OF ANTITHROMBIN III LEVELS.   Marland Kitchen Sodium 01/30/2015 152* 135 - 145 mmol/L Final  . Potassium 01/30/2015 3.6  3.5 - 5.1 mmol/L Final  . Chloride 01/30/2015 118* 101 - 111 mmol/L Final  . CO2 01/30/2015 26  22 - 32 mmol/L Final  . Glucose, Bld 01/30/2015 161* 65 - 99 mg/dL Final  . BUN 26/35/8991 71* 6 - 20 mg/dL Final  . Creatinine, Ser 01/30/2015 1.14  0.61 - 1.24 mg/dL Final  . Calcium 91/66/2569 9.7  8.9 - 10.3 mg/dL Final  . Total Protein 01/30/2015 6.8  6.5 - 8.1 g/dL Final  . Albumin 61/68/5160 2.8* 3.5 - 5.0 g/dL Final  . AST 12/85/5861 45* 15 - 41 U/L Final  . ALT 01/30/2015 49  17 - 63 U/L Final  . Alkaline Phosphatase 01/30/2015 113  38 - 126 U/L Final  . Total Bilirubin 01/30/2015 0.8  0.3 - 1.2 mg/dL Final  . GFR calc non Af Amer 01/30/2015 >60  >60 mL/min Final  . GFR calc Af Amer 01/30/2015 >60  >60 mL/min Final   Comment: (NOTE) The eGFR has been calculated using the CKD EPI equation. This calculation has not been validated in all clinical situations. eGFR's persistently <60 mL/min signify possible Chronic Kidney Disease.   . Anion gap 01/30/2015 8  5 - 15 Final  . WBC 01/30/2015 9.5  4.0 - 10.5 K/uL Final  . RBC 01/30/2015 3.54* 4.22 - 5.81 MIL/uL Final  . Hemoglobin 01/30/2015 10.7* 13.0 - 17.0 g/dL Final  . HCT 01/30/2015 36.2* 39.0 - 52.0 % Final  . MCV 01/30/2015 102.3* 78.0 - 100.0 fL Final  . MCH 01/30/2015 30.2  26.0 - 34.0 pg Final  . MCHC 01/30/2015 29.6* 30.0 - 36.0 g/dL Final  . RDW 01/30/2015 16.8* 11.5 -  15.5 % Final  . Platelets 01/30/2015 169  150 - 400 K/uL Final  . Magnesium 01/30/2015 1.7  1.7 - 2.4 mg/dL Final  . Phosphorus 01/30/2015 3.6  2.5 - 4.6 mg/dL Final  . Heparin Unfractionated 01/30/2015 0.23* 0.30 - 0.70 IU/mL Final   Comment:        IF HEPARIN RESULTS ARE BELOW EXPECTED VALUES, AND PATIENT DOSAGE HAS BEEN CONFIRMED, SUGGEST FOLLOW UP TESTING OF ANTITHROMBIN III LEVELS.   Marland Kitchen Heparin Unfractionated 01/30/2015 0.38  0.30 - 0.70 IU/mL Final   Comment:        IF HEPARIN RESULTS ARE BELOW EXPECTED VALUES, AND PATIENT DOSAGE HAS BEEN CONFIRMED, SUGGEST FOLLOW UP TESTING OF ANTITHROMBIN III LEVELS.   Marland Kitchen Heparin Unfractionated 01/31/2015 0.31  0.30 - 0.70 IU/mL Final   Comment:        IF HEPARIN RESULTS ARE BELOW EXPECTED VALUES, AND PATIENT DOSAGE HAS BEEN CONFIRMED, SUGGEST FOLLOW UP TESTING OF ANTITHROMBIN III LEVELS.   Marland Kitchen Sodium 01/31/2015 153* 135 - 145 mmol/L Final  . Potassium 01/31/2015 3.3* 3.5 - 5.1 mmol/L Final  . Chloride 01/31/2015 116* 101 - 111 mmol/L Final  . CO2 01/31/2015 28  22 - 32 mmol/L Final  . Glucose, Bld 01/31/2015 126* 65 - 99 mg/dL Final  . BUN 01/31/2015 61* 6 - 20 mg/dL Final  . Creatinine, Ser 01/31/2015 1.03  0.61 - 1.24 mg/dL Final  . Calcium 01/31/2015 10.0  8.9 - 10.3 mg/dL Final  . GFR calc non Af Amer 01/31/2015 >60  >60 mL/min Final  . GFR calc Af Amer 01/31/2015 >60  >60 mL/min Final   Comment: (NOTE) The eGFR has been calculated using the CKD EPI equation. This calculation has not been validated in all clinical situations. eGFR's persistently <60 mL/min signify possible Chronic Kidney Disease.   . Anion gap 01/31/2015 9  5 - 15 Final  . Folate, Hemolysate 01/31/2015 237.8  Not Estab. ng/mL Final  . Hematocrit 01/31/2015 32.3* 37.5 - 51.0 % Final  . Folate, RBC 01/31/2015 736  >498 ng/mL Final   Comment: (NOTE) Performed At: [facility] [place], [place] [id number] Lindon Romp MD  WU:[id number]   . Vitamin B-12 01/31/2015 304  180 - 914 pg/mL Final   Comment: (NOTE) This assay is not validated for testing neonatal or myeloproliferative syndrome specimens for Vitamin B12 levels.   . Sodium 02/01/2015 150* 135 - 145 mmol/L Final  . Potassium 02/01/2015 3.5  3.5 - 5.1 mmol/L Final  . Chloride 02/01/2015 115* 101 - 111 mmol/L Final  . CO2 02/01/2015 25  22 - 32 mmol/L Final  . Glucose, Bld 02/01/2015 139* 65 - 99 mg/dL Final  . BUN 02/01/2015 43* 6 - 20 mg/dL Final  . Creatinine, Ser 02/01/2015 0.98  0.61 - 1.24 mg/dL Final  . Calcium 02/01/2015 9.4  8.9 - 10.3 mg/dL Final  . GFR calc non Af Amer 02/01/2015 >60  >60 mL/min Final  . GFR calc Af Amer 02/01/2015 >60  >  60 mL/min Final   Comment: (NOTE) The eGFR has been calculated using the CKD EPI equation. This calculation has not been validated in all clinical situations. eGFR's persistently <60 mL/min signify possible Chronic Kidney Disease.   . Anion gap 02/01/2015 10  5 - 15 Final  . FIO2 02/02/2015 0.28   Final  . Delivery systems 02/02/2015 TRACH COLLAR/TRACH TUBE   Final  . pH, Arterial 02/02/2015 7.326* 7.350 - 7.450 Final  . pCO2 arterial 02/02/2015 47.6* 35.0 - 45.0 mmHg Final  . pO2, Arterial 02/02/2015 73.3* 80.0 - 100.0 mmHg Final  . Bicarbonate 02/02/2015 24.2* 20.0 - 24.0 mEq/L Final  . TCO2 02/02/2015 25.6  0 - 100 mmol/L Final  . Acid-base deficit 02/02/2015 1.0  0.0 - 2.0 mmol/L Final  . O2 Saturation 02/02/2015 95.2   Final  . Patient temperature 02/02/2015 98.6   Final  . Collection site 02/02/2015 RIGHT RADIAL   Final  . Drawn by 02/02/2015 COLLECTED BY RT   Final  . Sample type 02/02/2015 ARTERIAL   Final  . Allens test (pass/fail) 02/02/2015 PASS  PASS Final  . Potassium 02/03/2015 4.7  3.5 - 5.1 mmol/L Final  . Magnesium 02/04/2015 1.7  1.7 - 2.4 mg/dL Final  . WBC 02/04/2015 7.2  4.0 - 10.5 K/uL Final  . RBC 02/04/2015 2.99* 4.22 - 5.81 MIL/uL Final  . Hemoglobin 02/04/2015 9.3*  13.0 - 17.0 g/dL Final  . HCT 02/04/2015 29.6* 39.0 - 52.0 % Final  . MCV 02/04/2015 99.0  78.0 - 100.0 fL Final  . MCH 02/04/2015 31.1  26.0 - 34.0 pg Final  . MCHC 02/04/2015 31.4  30.0 - 36.0 g/dL Final  . RDW 02/04/2015 16.9* 11.5 - 15.5 % Final  . Platelets 02/04/2015 187  150 - 400 K/uL Final  . Sodium 02/04/2015 143  135 - 145 mmol/L Final  . Potassium 02/04/2015 4.2  3.5 - 5.1 mmol/L Final  . Chloride 02/04/2015 109  101 - 111 mmol/L Final  . CO2 02/04/2015 28  22 - 32 mmol/L Final  . Glucose, Bld 02/04/2015 112* 65 - 99 mg/dL Final  . BUN 02/04/2015 33* 6 - 20 mg/dL Final  . Creatinine, Ser 02/04/2015 0.99  0.61 - 1.24 mg/dL Final  . Calcium 02/04/2015 9.7  8.9 - 10.3 mg/dL Final  . GFR calc non Af Amer 02/04/2015 >60  >60 mL/min Final  . GFR calc Af Amer 02/04/2015 >60  >60 mL/min Final   Comment: (NOTE) The eGFR has been calculated using the CKD EPI equation. This calculation has not been validated in all clinical situations. eGFR's persistently <60 mL/min signify possible Chronic Kidney Disease.   . Anion gap 02/04/2015 6  5 - 15 Final  . Specimen Description 02/10/2015 TRACHEAL ASPIRATE   Final  . Special Requests 02/10/2015 NONE   Final  . Gram Stain 02/10/2015    Final                   Value:NO WBC SEEN NO SQUAMOUS EPITHELIAL CELLS SEEN NO ORGANISMS SEEN Performed at [facility]   . Culture 02/10/2015    Final                   Value:Non-Pathogenic Oropharyngeal-type Flora Isolated. Performed at [facility]   . Report Status 02/10/2015 02/13/2015 FINAL   Final  . WBC 02/11/2015 9.7  4.0 - 10.5 K/uL Final  . RBC 02/11/2015 3.39* 4.22 - 5.81 MIL/uL Final  . Hemoglobin 02/11/2015  10.3* 13.0 - 17.0 g/dL Final  . HCT 02/11/2015 33.7* 39.0 - 52.0 % Final  . MCV 02/11/2015 99.4  78.0 - 100.0 fL Final  . MCH 02/11/2015 30.4  26.0 - 34.0 pg Final  . MCHC 02/11/2015 30.6  30.0 - 36.0 g/dL Final  . RDW 02/11/2015 17.1* 11.5 - 15.5 % Final  .  Platelets 02/11/2015 171  150 - 400 K/uL Final  . Sodium 02/11/2015 144  135 - 145 mmol/L Final  . Potassium 02/11/2015 3.6  3.5 - 5.1 mmol/L Final  . Chloride 02/11/2015 104  101 - 111 mmol/L Final  . CO2 02/11/2015 28  22 - 32 mmol/L Final  . Glucose, Bld 02/11/2015 115* 65 - 99 mg/dL Final  . BUN 02/11/2015 25* 6 - 20 mg/dL Final  . Creatinine, Ser 02/11/2015 0.92  0.61 - 1.24 mg/dL Final  . Calcium 02/11/2015 8.7* 8.9 - 10.3 mg/dL Final  . GFR calc non Af Amer 02/11/2015 >60  >60 mL/min Final  . GFR calc Af Amer 02/11/2015 >60  >60 mL/min Final   Comment: (NOTE) The eGFR has been calculated using the CKD EPI equation. This calculation has not been validated in all clinical situations. eGFR's persistently <60 mL/min signify possible Chronic Kidney Disease.   . Anion gap 02/11/2015 12  5 - 15 Final  . FIO2 02/12/2015 0.50   Final  . Delivery systems 02/12/2015 VENTILATOR   Final  . Mode 02/12/2015 PRESSURE SUPPORT   Final  . Peep/cpap 02/12/2015 5.0   Final  . Pressure support 02/12/2015 12.0   Final  . pH, Arterial 02/12/2015 7.346* 7.350 - 7.450 Final  . pCO2 arterial 02/12/2015 57.8* 35.0 - 45.0 mmHg Final   Comment: CRITICAL RESULT CALLED TO, READ BACK BY AND VERIFIED WITH: KRISTIN FERGUSON,RRT,RCP AT 1104 BY KATEY FARGIS,RRT,RCP ON 02/12/2015   . pO2, Arterial 02/12/2015 127* 80.0 - 100.0 mmHg Final  . Bicarbonate 02/12/2015 30.8* 20.0 - 24.0 mEq/L Final  . TCO2 02/12/2015 32.5  0 - 100 mmol/L Final  . Acid-Base Excess 02/12/2015 5.4* 0.0 - 2.0 mmol/L Final  . O2 Saturation 02/12/2015 98.5   Final  . Patient temperature 02/12/2015 98.6   Final  . Collection site 02/12/2015 LEFT RADIAL   Final  . Drawn by 02/12/2015 COLLECTED BY RT   Final  . Sample type 02/12/2015 ARTERIAL DRAW   Final  . Allens test (pass/fail) 02/12/2015 PASS  PASS Final  . Hgb A1c MFr Bld 02/13/2015 6.3* 4.8 - 5.6 % Final   Comment: (NOTE)         Pre-diabetes: 5.7 - 6.4         Diabetes: >6.4          Glycemic control for adults with diabetes: <7.0   . Mean Plasma Glucose 02/13/2015 134   Final   Comment: (NOTE) Performed At: [facility] [place], [place] [id number] Lindon Romp MD KG:[id number]   . Sodium 02/13/2015 146* 135 - 145 mmol/L Final  . Potassium 02/13/2015 4.5  3.5 - 5.1 mmol/L Final  . Chloride 02/13/2015 101  101 - 111 mmol/L Final  . CO2 02/13/2015 31  22 - 32 mmol/L Final  . Glucose, Bld 02/13/2015 115* 65 - 99 mg/dL Final  . BUN 02/13/2015 31* 6 - 20 mg/dL Final  . Creatinine, Ser 02/13/2015 0.91  0.61 - 1.24 mg/dL Final  . Calcium 02/13/2015 9.3  8.9 - 10.3 mg/dL Final  . GFR calc non Af Amer 02/13/2015 >60  >  60 mL/min Final  . GFR calc Af Amer 02/13/2015 >60  >60 mL/min Final   Comment: (NOTE) The eGFR has been calculated using the CKD EPI equation. This calculation has not been validated in all clinical situations. eGFR's persistently <60 mL/min signify possible Chronic Kidney Disease.   . Anion gap 02/13/2015 14  5 - 15 Final  . Magnesium 02/13/2015 2.8* 1.7 - 2.4 mg/dL Final  . Sodium 02/14/2015 146* 135 - 145 mmol/L Final  . Potassium 02/14/2015 4.0  3.5 - 5.1 mmol/L Final  . Chloride 02/14/2015 102  101 - 111 mmol/L Final  . CO2 02/14/2015 33* 22 - 32 mmol/L Final  . Glucose, Bld 02/14/2015 124* 65 - 99 mg/dL Final  . BUN 02/14/2015 34* 6 - 20 mg/dL Final  . Creatinine, Ser 02/14/2015 0.93  0.61 - 1.24 mg/dL Final  . Calcium 02/14/2015 9.5  8.9 - 10.3 mg/dL Final  . GFR calc non Af Amer 02/14/2015 >60  >60 mL/min Final  . GFR calc Af Amer 02/14/2015 >60  >60 mL/min Final   Comment: (NOTE) The eGFR has been calculated using the CKD EPI equation. This calculation has not been validated in all clinical situations. eGFR's persistently <60 mL/min signify possible Chronic Kidney Disease.   . Anion gap 02/14/2015 11  5 - 15 Final  Admission on 01/20/2015, Discharged on 01/29/2015  No results displayed because visit has over  200 results.    Admission on 01/19/2015, Discharged on 01/20/2015  Component Date Value Ref Range Status  . Sodium 01/20/2015 141  135 - 145 mmol/L Final  . Potassium 01/20/2015 5.2* 3.5 - 5.1 mmol/L Final  . Chloride 01/20/2015 97* 101 - 111 mmol/L Final  . CO2 01/20/2015 26  22 - 32 mmol/L Final  . Glucose, Bld 01/20/2015 162* 65 - 99 mg/dL Final  . BUN 01/20/2015 102* 6 - 20 mg/dL Final  . Creatinine, Ser 01/20/2015 4.01* 0.61 - 1.24 mg/dL Final  . Calcium 01/20/2015 9.0  8.9 - 10.3 mg/dL Final  . GFR calc non Af Amer 01/20/2015 15* >60 mL/min Final  . GFR calc Af Amer 01/20/2015 17* >60 mL/min Final   Comment: (NOTE) The eGFR has been calculated using the CKD EPI equation. This calculation has not been validated in all clinical situations. eGFR's persistently <60 mL/min signify possible Chronic Kidney Disease.   . Anion gap 01/20/2015 18* 5 - 15 Final  . WBC 01/20/2015 14.3* 4.0 - 10.5 K/uL Final  . RBC 01/20/2015 3.74* 4.22 - 5.81 MIL/uL Final  . Hemoglobin 01/20/2015 11.6* 13.0 - 17.0 g/dL Final  . HCT 01/20/2015 36.7* 39.0 - 52.0 % Final  . MCV 01/20/2015 98.1  78.0 - 100.0 fL Final  . MCH 01/20/2015 31.0  26.0 - 34.0 pg Final  . MCHC 01/20/2015 31.6  30.0 - 36.0 g/dL Final  . RDW 01/20/2015 16.7* 11.5 - 15.5 % Final  . Platelets 01/20/2015 194  150 - 400 K/uL Final  . Neutrophils Relative % 01/20/2015 75  43 - 77 % Final  . Neutro Abs 01/20/2015 10.8* 1.7 - 7.7 K/uL Final  . Lymphocytes Relative 01/20/2015 9* 12 - 46 % Final  . Lymphs Abs 01/20/2015 1.3  0.7 - 4.0 K/uL Final  . Monocytes Relative 01/20/2015 12  3 - 12 % Final  . Monocytes Absolute 01/20/2015 1.7* 0.1 - 1.0 K/uL Final  . Eosinophils Relative 01/20/2015 3  0 - 5 % Final  . Eosinophils Absolute 01/20/2015 0.5  0.0 - 0.7 K/uL Final  .  Basophils Relative 01/20/2015 1  0 - 1 % Final  . Basophils Absolute 01/20/2015 0.1  0.0 - 0.1 K/uL Final  Admission on 12/28/2014, Discharged on 01/19/2015  No results  displayed because visit has over 200 results.      Dg Esophagus  02/25/2015   CLINICAL DATA:  Regurgitation/ vomiting during meals. Morbid obesity. Acute encephalopathy.  EXAM: ESOPHOGRAM/BARIUM SWALLOW  TECHNIQUE: Single contrast examination was performed using  thin barium.  FLUOROSCOPY TIME:  If the device does not provide the exposure index:  Fluoroscopy Time:  1 minutes and 36 seconds  Number of Acquired Images:  None  COMPARISON:  01/28/2015 abdominal CT.  FINDINGS: Focused, single-contrast exam was performed with the patient in a supine, minimally left posterior oblique position.  Right-sided dialysis catheter.  Tracheostomy.  No high-grade stricture or obstruction to contrast flow. Primarily on the first series, there is apparent irregularity involving the upper thoracic esophagus, including on image 80 of series 1.  Suspect esophageal dysmotility, mild. Contrast stasis in the lower thoracic esophagus.  IMPRESSION: 1. Focused, limited exam as detailed above. 2. No high-grade stricture or obstruction. 3. Probable esophageal dysmotility. This could represent presbyesophagus. 4. Irregularity involving the upper esophagus on initial swallows. This is less impressive on subsequent, better distended swallows. Cannot exclude mucosal lesion in this area. Depending on clinical suspicion, an endoscopy should be considered. Alternatively, when patient's clinical status improves, dedicated outpatient standard double-contrast study could be performed. These results will be called to the ordering clinician or representative by the Radiologist Assistant, and communication documented in the PACS or zVision Dashboard.   Electronically Signed   By: Abigail Miyamoto M.D.   On: 02/25/2015 15:18   Ir Removal Tun Cv Cath W/o Fl  02/27/2015   INDICATION: 64 year old male with a history of acute renal insufficiency with return of renal function. A tunneled hemodialysis catheter has been requested to be removed.  EXAM: BEDSIDE  REMOVAL OF TUNNELED HEMODIALYSIS CATHETER.  MEDICATIONS: None  CONTRAST:  None  ANESTHESIA/SEDATION: None  FLUOROSCOPY TIME:  None  COMPLICATIONS: None  PROCEDURE: Informed written consent was obtained from the patient after a discussion of the risks, benefits, and alternatives to treatment. Questions regarding the procedure were encouraged and answered. The right neck and chest were prepped with chlorhexidine in a sterile fashion, and a sterile drape was applied covering the operative field. Maximum barrier sterile technique with sterile gowns and gloves were used for the procedure. A timeout was performed prior to the initiation of the procedure.  After generous infiltration of the skin and subcutaneous tissues adjacent to the indwelling catheter, blunt dissection was used to free the catheter from the tract and the catheter was removed in its entirety.  A sterile dressing was placed.  The patient tolerated the procedure well and remained hemodynamically stable throughout.  No complications were encountered and no significant blood loss was encountered.  IMPRESSION: Status post bedside removal of tunneled hemodialysis catheter. Catheter removed in its entirety.  Signed,  Dulcy Fanny. Earleen Newport, DO  [facility]  [facility]   Electronically Signed   By: Corrie Mckusick D.O.   On: 02/27/2015 17:28     Assessment/Plan   ICD-9-CM ICD-10-CM   1. Chronic respiratory failure with hypoxia 518.83 J96.11    799.02    2. Physical deconditioning 799.3 R53.81   3. 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 DVT (deep venous thrombosis), bilateral - on coumadin 453.40 I82.403   5. Depression  with anxiety - borderline controlled 300.4 F41.8   6. Obesity hypoventilation syndrome on provigil - stable 278.03 E66.2   7. Essential hypertension, benign - stable 401.1 I10   8. Tobacco abuse - stable 305.1 Z72.0   9. Chronic obstructive pulmonary disease, unspecified COPD,  unspecified chronic bronchitis type - stable 496 J44.9   10. 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 Right inguinal hernia  550.90 K40.90   12. Moderate protein-calorie malnutrition - stable 263.0 E44.0     --cont trach collar care  --check O2 sats every shift  --cont Peg tube care. Free water flushes 100cc every shift. He is also on a NCS/NAS diet  --check BMP in 1 week  --PT/INR as ordered  --cont nutritional supplement  --PT/OT as tolerated  --f/u with Ortho/pulm as scheduled  --cont Lonzo meds as ordered  --GOAL: short term rehab and d/c home when medically appropriate. Communicated with pt and nursing.  Hillis Mcphatter S. Perlie Gold  [facility] [address] [place], [place] [address] [phone] Cell (Monday-Friday 8 AM - 5 PM) [phone] After 5 PM and follow prompts

## 2015-04-22 ENCOUNTER — Non-Acute Institutional Stay (SKILLED_NURSING_FACILITY): Payer: PRIVATE HEALTH INSURANCE | Admitting: Adult Health

## 2015-04-22 ENCOUNTER — Encounter: Payer: Self-pay | Admitting: Adult Health

## 2015-04-22 DIAGNOSIS — I10 Essential (primary) hypertension: Secondary | ICD-10-CM

## 2015-04-22 DIAGNOSIS — F418 Other specified anxiety disorders: Secondary | ICD-10-CM

## 2015-04-22 DIAGNOSIS — J449 Chronic obstructive pulmonary disease, unspecified: Secondary | ICD-10-CM

## 2015-04-22 DIAGNOSIS — I82403 Acute embolism and thrombosis of unspecified deep veins of lower extremity, bilateral: Secondary | ICD-10-CM | POA: Diagnosis not present

## 2015-04-22 DIAGNOSIS — J9611 Chronic respiratory failure with hypoxia: Secondary | ICD-10-CM

## 2015-04-22 DIAGNOSIS — R5381 Other malaise: Secondary | ICD-10-CM

## 2015-04-22 DIAGNOSIS — I5032 Chronic diastolic (congestive) heart failure: Secondary | ICD-10-CM

## 2015-04-22 DIAGNOSIS — E876 Hypokalemia: Secondary | ICD-10-CM

## 2015-04-22 DIAGNOSIS — E662 Morbid (severe) obesity with alveolar hypoventilation: Secondary | ICD-10-CM

## 2015-04-22 MED ORDER — POTASSIUM CHLORIDE CRYS ER 20 MEQ PO TBCR: 40.0000 meq | EXTENDED_RELEASE_TABLET | Freq: Four times a day (QID) | ORAL | Status: DC

## 2015-04-22 NOTE — Progress Notes (Signed)
Patient ID: [name], male   DOB: 1951/03/11, 64 y.o.   MRN: [id number]   Facility: [facility]      No Known Allergies  Chief Complaint  Patient presents with  . Medical Management of Chronic Issues    HPI:  He is a long term resident of this facility being seen for the management of his chronic illnesses. Overall he is doing well. He continues to participate in therapy. He is not using his peg tube at this time. He willing to have it removed and agreed to do it this week. There are no nursing concerns at this time.     Past Medical History  Diagnosis Date  . Arthritis   . RCT (rotator cuff tear)   . Multiple abrasions     rt arm  . Morbid obesity   . Hypertension   . Hyperlipidemia   . COPD (chronic obstructive pulmonary disease)   . CHF (congestive heart failure)     Past Surgical History  Procedure Laterality Date  . No past surgeries    . Shoulder arthroscopy with subacromial decompression, rotator cuff repair and bicep tendon repair Right 12/28/2014    Procedure: RIGHT SHOULDER ARTHROSCOPY WITH SUBACROMIAL DECOMPRESSION, DISTAL CLAVICLE RESECTION, ROTATOR CUFF REPAIR ;  Surgeon: Eugenia Mcalpine, MD;  Location: [facility] ORS;  Service: Orthopedics;  Laterality: Right;  . Tracheostomy tube placement N/A 01/16/2015    Procedure: TRACHEOSTOMY;  Surgeon: Flo Shanks, MD;  Location: [facility] OR;  Service: ENT;  Laterality: N/A;  . Tracheostomy revision N/A 01/18/2015    Procedure: TRACHEOSTOMY REVISION;  Surgeon: Melvenia Beam, MD;  Location: [facility] OR;  Service: ENT;  Laterality: N/A;  . Tracheostomy tube placement N/A 01/22/2015    Procedure: TRACHEOSTOMY;  Surgeon: Flo Shanks, MD;  Location: [facility] OR;  Service: ENT;  Laterality: N/A;    VITAL SIGNS BP 104/56 mmHg  Pulse 78  Ht 6' (1.829 m)  Wt 313 lb (141.976 kg)  BMI 42.44 kg/m2  SpO2 97%  Patient's Medications  New Prescriptions   No medications on file  Previous Medications   CLONAZEPAM (KLONOPIN) 0.5 MG  TABLET    Take 0.5 mg by mouth 3 (three) times daily.   FAMOTIDINE (PEPCID) 20 MG TABLET    Take 20 mg by mouth daily.   FOLIC ACID (FOLVITE) 1 MG TABLET    Take 1 mg by mouth daily.   FUROSEMIDE (LASIX) 40 MG TABLET    Take 40 mg by mouth daily.   HYDRALAZINE (APRESOLINE) 25 MG TABLET    Take 37.5 mg by mouth 2 (two) times daily.   IPRATROPIUM-ALBUTEROL (DUONEB) 0.5-2.5 (3) MG/3ML SOLN    Take 3 mLs by nebulization every 6 (six) hours as needed.   ISOSORBIDE DINITRATE (ISORDIL) 20 MG TABLET    Take 20 mg by mouth 2 (two) times daily.   LIDOCAINE (LIDODERM) 5 %    Place 1 patch onto the skin daily. Remove & Discard patch within 12 hours or as directed by MD To right shoulder   MELATONIN 3 MG TABS    Take 9 mg by mouth at bedtime.   METOCLOPRAMIDE (REGLAN) 10 MG TABLET    Take 10 mg by mouth 4 (four) times daily -  before meals and at bedtime.   METOLAZONE (ZAROXOLYN) 5 MG TABLET    Take 5 mg by mouth daily.   MODAFINIL (PROVIGIL) 100 MG TABLET    Take 100 mg by mouth daily.   NICOTINE (NICODERM CQ - DOSED IN  MG/24 HR) 7 MG/24HR PATCH    Place 7 mg onto the skin daily.   NUTRITIONAL SUPPLEMENTS (ADULT NUTRITIONAL SUPPLEMENT + PO)    Take 240 mLs by mouth 2 (two) times daily. 2-CAL SUPPLEMENT   OMEGA-3 FATTY ACIDS (FISH OIL) 1000 MG CAPS    Take 1,000 mg by mouth daily.    ONDANSETRON (ZOFRAN) 4 MG TABLET    Take 4 mg by mouth every 6 (six) hours.   POLYCARBOPHIL (FIBERCON) 625 MG TABLET    Take 1,250 mg by mouth 3 (three) times daily.   POLYETHYLENE GLYCOL (MIRALAX / GLYCOLAX) PACKET    Take 17 g by mouth daily.   POTASSIUM CHLORIDE SA (K-DUR,KLOR-CON) 20 MEQ TABLET    Take 40 mEq by mouth 3 (three) times daily.    SERTRALINE (ZOLOFT) 100 MG TABLET    Take 100 mg by mouth daily.   WARFARIN (COUMADIN) 5 MG TABLET    Take 7 mg by mouth daily at 6 PM.   Modified Medications   No medications on file  Discontinued Medications   AMINO ACIDS-PROTEIN HYDROLYS (FEEDING SUPPLEMENT, PRO-STAT SUGAR FREE  64,) LIQD    Take 30 mLs by mouth 2 (two) times daily.     SIGNIFICANT DIAGNOSTIC EXAMS  12-29-14: testicle ultrasound: Two solid extratesticular right scrotal masses. Primary differential diagnosis for extratesticular masses include primary or metastatic neoplasms including adenomatoid tumor, fibroma, leiomyoma, mesothelioma, or sarcoma. Urology consultation is recommended. No sonographic evidence for testicular torsion or intratesticular mass.  01-01-15: 2-d echo: Left ventricle: The cavity size was normal. There was mild focal basal and mild concentric hypertrophy of the septum. Systolic function was normal. The estimated ejection fraction was in the range of 60% to 65%. Wall motion was normal; there were no regional wall motion abnormalities. Doppler parameters are consistent with abnormal left ventricular relaxation (grade 1 diastolic dysfunction). There was no evidence of elevate ventricular filling pressure by Doppler parameters. - Aortic valve: There was no regurgitation. - Aortic root: The aortic root was normal in size. - Mitral valve: Structurally normal valve. There was no regurgitation. - Right ventricle: Systolic function was normal. - Right atrium: The atrium was normal in size. - Tricuspid valve: There was trivial regurgitation. - Pulmonary arteries: Systolic pressure was within the normal range.   01-07-15: renal ultrasound: Limited examination demonstrating no acute abnormality. Negative for Hydronephrosis. Possible nonobstructing stone lower pole left kidney.  01-18-15: IVC filter placement: Successful IVC filter placement. This is temporary or can remain in place to become permanent.  01-28-15: ct of abdomen: 1. No significant colonic interposition between the anterior abdominal wall and stomach. 2. Hepatic steatosis. 3. Bilateral nephrolithiasis. Possible left UPJ stenosis without secondary signs of ureteral obstruction. 4. Cardiomegaly, bibasilar atelectasis and a small  amount of retrosternal air, likely related to previous thoracic intervention.   01-28-15: ct of head: 1. No acute intracranial findings demonstrated. Mild atrophy and periventricular white matter disease. 2. Bilateral mastoid effusions with opacification of the left middle ear.   LABS REVIEWED:   12-31-14: hgb a1c 7.5; 01-23-15: hgb a1c 7.2  01-31-15: vit b12: 304 02-13-15: hgb a1c 6.3 03-14-15: wbc 8.5; hgb 12.;1 hct 36.3; mcv 93.1; plt 251; glucose 112; bun 20; creat 0.82; k+3.0; na++135; phos 3.7; mag 1.2; albumin 3.2 03-25-15: wbc 8.5; hgb 11.7; hct 36.4; mcv 95.0; plt 240; glucose 111; bun 20; creat 1.01; k+3.4; na++138  04-01-15: glucose 112; bun 19; creat 0.97; k+3.3; na++139 04-03-15: inr 1.63 coumadin 6 mg 04-09-15: inr 2.16  coumadin 7 mg 04-15-15: inr 2.16 coumadin 7 mg 04-12-15: glucose 101; bun 20; creat 0.99; k+3.3; na++ 140     Review of Systems Constitutional: Negative for appetite change and fatigue.  HENT: Negative for congestion.   Respiratory: Negative for cough, chest tightness and shortness of breath.   Cardiovascular: Negative for chest pain, palpitations and leg swelling.  Gastrointestinal: Negative for nausea, abdominal pain, diarrhea and constipation.  Musculoskeletal: Negative for myalgias and arthralgias.  Skin: Negative for pallor.  Neurological: Negative for dizziness.  Psychiatric/Behavioral: The patient is not nervous/anxious.       Physical Exam Constitutional: He is oriented to person, place, and time. No distress. is obese  Eyes: Conjunctivae are normal.  Neck: Neck supple. No JVD present. No thyromegaly present.  Cardiovascular: Normal rate, regular rhythm and intact distal pulses.   Respiratory: Effort normal and breath sounds normal. No respiratory distress. He has no wheezes.  Has capped trach   GI: Soft. Bowel sounds are normal. He exhibits no distension. There is no tenderness.  Peg tube present   Musculoskeletal: He exhibits no edema.  Able to  move all extremities   Lymphadenopathy:    He has no cervical adenopathy.  Neurological: He is alert and oriented to person, place, and time.  Skin: Skin is warm and dry. He is not diaphoretic.  Psychiatric: He has a normal mood and affect.     ASSESSMENT/ PLAN:  1. Chronic pain: his pain is presently being managed with lidoderm patch to right shoulder; will not make changes will monitor his status   2. COPD; chronic respiratory failure; obesity hypoventilation syndrome:   is presenlty stable is trach dependent is presently not on medications; will not make changes is awaiting pulmonology consult regarding reducing his trach size.   3. Diastolic heart failure: will continue lasix 40 mg daily zaroxolyn 5 mg daily   4. Hypokalemia: will increase k+ to 40 meq 4 times daily will check bmp in one week    5. Hypertension: will continue hydralazine 37.5 mg twice daily; isordil 20 mg twice daily   6. Genella Rife: will continue reglan 10 mg four times daily and pepcid 20 mg daily   7. Constipation: will continue miralax daily ; fibercon 2 tabs three times daily   8. Depression with anxiety: will continue zoloft 100 mg daily; provigil 100 mg daily; and will continue melatonin 9 mg nightly for sleep  9.  Bilateral DVT: is status post IVC filter; is on long term coumadin therapy; will continue coumadin 7 mg nightly and will check inr in the am.   10. Status post right rotator cuff repair: will continue therapy as directed to improve function of his shoulder  11. Physical deconditioning: will continue therapy as directed to improve upon his strength; balance; gait and independence with adl's.       Synthia Innocent NP [facility]  Contact [phone] Monday through Friday 8am- 5pm  After hours call [phone]

## 2015-04-23 ENCOUNTER — Telehealth: Payer: Self-pay

## 2015-04-23 NOTE — Telephone Encounter (Signed)
[name] requested all medical records from 06/30/14 until present for a workers comp claim. Started the release and placed on hold until I get paperwork back from [place]. I will send down the request today 04/23/15 for [name] to look at.   They want all records faxed to [phone] when completed.

## 2015-04-26 NOTE — Telephone Encounter (Signed)
Pt records scanned into Epic and faxed over to [facility] on 04/26/15.

## 2015-05-01 ENCOUNTER — Other Ambulatory Visit: Payer: Self-pay | Admitting: Internal Medicine

## 2015-05-01 DIAGNOSIS — Z431 Encounter for attention to gastrostomy: Secondary | ICD-10-CM

## 2015-05-07 ENCOUNTER — Ambulatory Visit (HOSPITAL_COMMUNITY)
Admission: RE | Admit: 2015-05-07 | Discharge: 2015-05-07 | Disposition: A | Discharge status: Home / Self Care | Payer: No Typology Code available for payment source | Source: Ambulatory Visit | Attending: Internal Medicine | Admitting: Internal Medicine

## 2015-05-07 DIAGNOSIS — Z431 Encounter for attention to gastrostomy: Secondary | ICD-10-CM | POA: Diagnosis present

## 2015-05-07 LAB — PROTIME-INR
INR: 1.88 — AB (ref 0.00–1.49)
Prothrombin Time: 21.5 seconds — ABNORMAL HIGH (ref 11.6–15.2)

## 2015-05-07 MED ORDER — LIDOCAINE VISCOUS 2 % MT SOLN
OROMUCOSAL | Status: AC
  Filled 2015-05-07: qty 15

## 2015-05-20 ENCOUNTER — Non-Acute Institutional Stay (SKILLED_NURSING_FACILITY): Payer: PRIVATE HEALTH INSURANCE | Admitting: Adult Health

## 2015-05-20 DIAGNOSIS — I82501 Chronic embolism and thrombosis of unspecified deep veins of right lower extremity: Secondary | ICD-10-CM | POA: Diagnosis not present

## 2015-05-20 DIAGNOSIS — I5032 Chronic diastolic (congestive) heart failure: Secondary | ICD-10-CM | POA: Diagnosis not present

## 2015-05-20 DIAGNOSIS — Z930 Tracheostomy status: Secondary | ICD-10-CM

## 2015-05-20 DIAGNOSIS — E662 Morbid (severe) obesity with alveolar hypoventilation: Secondary | ICD-10-CM | POA: Diagnosis not present

## 2015-05-20 DIAGNOSIS — J9611 Chronic respiratory failure with hypoxia: Secondary | ICD-10-CM | POA: Diagnosis not present

## 2015-05-29 ENCOUNTER — Other Ambulatory Visit: Payer: Self-pay | Admitting: Specialist

## 2015-05-29 ENCOUNTER — Ambulatory Visit (HOSPITAL_COMMUNITY)
Admission: RE | Admit: 2015-05-29 | Discharge: 2015-05-29 | Disposition: A | Discharge status: Home / Self Care | Payer: No Typology Code available for payment source | Source: Ambulatory Visit | Attending: Cardiovascular Disease | Admitting: Cardiovascular Disease

## 2015-05-29 DIAGNOSIS — M79604 Pain in right leg: Secondary | ICD-10-CM

## 2015-05-29 DIAGNOSIS — Z86718 Personal history of other venous thrombosis and embolism: Secondary | ICD-10-CM

## 2015-05-29 DIAGNOSIS — M7989 Other specified soft tissue disorders: Secondary | ICD-10-CM

## 2015-05-29 DIAGNOSIS — I82511 Chronic embolism and thrombosis of right femoral vein: Secondary | ICD-10-CM | POA: Diagnosis not present

## 2015-05-29 DIAGNOSIS — I10 Essential (primary) hypertension: Secondary | ICD-10-CM | POA: Diagnosis not present

## 2015-05-29 DIAGNOSIS — E785 Hyperlipidemia, unspecified: Secondary | ICD-10-CM | POA: Insufficient documentation

## 2015-06-13 ENCOUNTER — Ambulatory Visit (INDEPENDENT_AMBULATORY_CARE_PROVIDER_SITE_OTHER): Payer: No Typology Code available for payment source | Admitting: Internal Medicine

## 2015-06-13 ENCOUNTER — Encounter: Payer: Self-pay | Admitting: Internal Medicine

## 2015-06-13 VITALS — BP 125/65 | Temp 97.8°F | Wt 306.6 lb

## 2015-06-13 DIAGNOSIS — Z7901 Long term (current) use of anticoagulants: Secondary | ICD-10-CM | POA: Diagnosis not present

## 2015-06-13 DIAGNOSIS — N5089 Other specified disorders of the male genital organs: Secondary | ICD-10-CM | POA: Diagnosis not present

## 2015-06-13 DIAGNOSIS — I82509 Chronic embolism and thrombosis of unspecified deep veins of unspecified lower extremity: Secondary | ICD-10-CM

## 2015-06-13 DIAGNOSIS — N509 Disorder of male genital organs, unspecified: Secondary | ICD-10-CM

## 2015-06-13 DIAGNOSIS — I5032 Chronic diastolic (congestive) heart failure: Secondary | ICD-10-CM | POA: Diagnosis not present

## 2015-06-13 DIAGNOSIS — I825Z3 Chronic embolism and thrombosis of unspecified deep veins of distal lower extremity, bilateral: Secondary | ICD-10-CM

## 2015-06-13 LAB — POCT INR: INR: 6.8

## 2015-06-13 MED ORDER — FUROSEMIDE 40 MG PO TABS: 40.0000 mg | ORAL_TABLET | Freq: Every day | ORAL | Status: DC

## 2015-06-13 NOTE — Progress Notes (Signed)
Subjective:    Patient ID: [name], male    DOB: 1950-12-09, 64 y.o.   MRN: [id number]  HPI  Mr. Ator is a gentleman with a PMH of chronic DVTs with IVC filter placement, COPD, tobacco abuse, OHS, and diastolic CHF who comes to the clinic today to establish care with [place] after a prolonged ICU course and stay at [facility]. In April 2016, Mr. Bostick had a rotator cuff repair on his right shoulder after a work-related injury and was found to have a difficult extubation. After extubation, he was found to be encephalopathic and in acute respiratory failure, and he was transferred to the ICU. During the discontinuous ICU course, he was treated for HCAP, was found to have DVTs (an IVC filter was placed), a right-sided testicular mass (HCG and AFP were negative), and a trach collar was placed. He was discharged to [facility]. At the SNF, he was treated for OHS, OSA (no sleep study), HTN, dCHF and discharged on many medications, none of which he is taking. The only medication he has taken is warfarin. He was discharged from [facility] in September and he is living on his own. His primary concern today is the bilateral swelling in his legs, which has worsened since he has stopped using Lasix. He reports that his Worker's Comp will not pay for it because it's not related to his previous hospitalization. He says the swelling is non-painful. I spoke with his nursing case manager Elease Hashimoto over the phone who said that she will be closely following his care. She also informed me that his INR was 5.6 at his clinic visit at [facility] earlier that day. She said she will be attending as many of his appointments as possible.  Review of Systems  Constitutional: Negative for fever.       Weight decease of approximately 100 lbs since April.  HENT: Positive for congestion and postnasal drip. Negative for sore throat.   Eyes: Negative for visual disturbance.  Respiratory: Positive for  apnea.        Shortness of breath on exertion  Cardiovascular: Positive for leg swelling. Negative for chest pain and palpitations.  Gastrointestinal: Negative for nausea, vomiting, abdominal pain, diarrhea, constipation and abdominal distention.  Endocrine: Negative for polyuria.  Genitourinary: Negative for dysuria, hematuria, difficulty urinating and testicular pain.  Musculoskeletal: Positive for arthralgias.  Neurological: Negative for syncope and light-headedness.  Hematological: Does not bruise/bleed easily.  Psychiatric/Behavioral: Negative for dysphoric mood.       Objective:   Physical Exam  Constitutional: He is oriented to person, place, and time.  Obese man sitting in wheelchair, no acute distress  HENT:  Mouth/Throat: Oropharynx is clear and moist. No oropharyngeal exudate.  Eyes: EOM are normal. Pupils are equal, round, and reactive to light. No scleral icterus.  Neck:  Large neck circumference  Cardiovascular: Normal rate and regular rhythm.   No murmur heard. Pulmonary/Chest: Effort normal and breath sounds normal. No stridor. No respiratory distress.  Abdominal: Soft. Bowel sounds are normal. He exhibits no distension. There is no tenderness.  Genitourinary:  Palpable, non-tender mass on right testicle  Musculoskeletal:  2+ pitting edema to knees bilaterally. No clubbing or cyanosis.  Neurological: He is alert and oriented to person, place, and time. No cranial nerve deficit.  Skin: Skin is warm and dry. No rash noted.          Assessment & Plan:   Mr. Belgarde is a gentleman who has had  an extended ICU admission and is relatively new to the healthcare system. He will require multiple visits to diagnose, treat, and manage any chronic medical problems. Please see problem-based assessment and plan for details.

## 2015-06-13 NOTE — Assessment & Plan Note (Signed)
Reports that it has been present since he was 21. He says it has not been growing and has not been painful. Scrotal US shows two solid extratesticular masses with ddx including primary or metastatic neoplasms with recommendation of urology follow-up. AFB and hCG were negative. His payer method via Worker's comb presents problems for him getting a referral to urology. - Will follow-up at future visits and work on means of obtaining a urology referral.

## 2015-06-13 NOTE — Assessment & Plan Note (Signed)
Patient has hematuria on UA and had an episode of a nosebleed at his clinic visit today with [facility]. He denies any blood in his tool or bleeding elsewhere. He says he takes his coumadin as prescribed. His INR in clinic today is 6.8.  - Informed [name] to stop taking warfarin for now. - Follow-up in our coumadin clinic on Monday, he will require regular INR checks - Explained that if he were to feel suddenly weak, short of breath, or starts to bleed uncontrollably, he should call 911 immediately

## 2015-06-13 NOTE — Patient Instructions (Addendum)
[name], it was a pleasure to meet you today. We need to get a few blood tests, and set you up with our coumadin clinic to make sure you're getting the right coumadin dose. We also need to set you up with a urologist to evaluate your scrotal mass. We're also restarting you back on Lasix for your leg swelling. We'll see you in about a week.  Hold warfarin until Monday 06/17/15.  Please call clinic if you have any signs or symptoms of bleeding.

## 2015-06-13 NOTE — Assessment & Plan Note (Signed)
EF 65% and grade 1 diastolic dysfunction based on 1610 TTE. He endorses a degree of orthopnea, but has significant leg swelling on exam today. Previously, his lasix was effective at reducing his leg swelling. - Refill furosemide 40 mg daily

## 2015-06-13 NOTE — Progress Notes (Signed)
Pt on Warfarin for DVT with an INR goal of 2-3.  Pts INR supratherapeutic today at 6.8.  Pt demonstrates lack of compliance with his medications and is unsure of the dose that he is currently taking. [facility], on [facility]31/15 pt F[facility] 10 [facility] [address]. Pt denies s/sx of bleeding currently but does state that he has had some nose bleed sx which resolved quickly last week. Will hold warfarin until Monday 10/10 and F/U INR.  Instructed pt to call clinic or be seen if he experiences any s/sx of bleeding.  Instructed pt to bring in warfarin bottle with him and any other medications to next visit.

## 2015-06-14 LAB — BMP8+ANION GAP
ANION GAP: 19 mmol/L — AB (ref 10.0–18.0)
BUN/Creatinine Ratio: 25 — ABNORMAL HIGH (ref 10–22)
BUN: 28 mg/dL — ABNORMAL HIGH (ref 8–27)
CHLORIDE: 96 mmol/L — AB (ref 97–108)
CO2: 20 mmol/L (ref 18–29)
Calcium: 9 mg/dL (ref 8.6–10.2)
Creatinine, Ser: 1.14 mg/dL (ref 0.76–1.27)
GFR calc non Af Amer: 69 mL/min/{1.73_m2} (ref >59)
GFR, EST AFRICAN AMERICAN: 79 mL/min/{1.73_m2} (ref >59)
GLUCOSE: 127 mg/dL — AB (ref 65–99)
POTASSIUM: 3.7 mmol/L (ref 3.5–5.2)
Sodium: 135 mmol/L (ref 134–144)

## 2015-06-17 ENCOUNTER — Other Ambulatory Visit: Payer: Self-pay | Admitting: Internal Medicine

## 2015-06-17 ENCOUNTER — Ambulatory Visit: Payer: Self-pay | Admitting: Pharmacist

## 2015-06-17 ENCOUNTER — Telehealth: Payer: Self-pay | Admitting: *Deleted

## 2015-06-17 ENCOUNTER — Inpatient Hospital Stay (HOSPITAL_COMMUNITY)
Admission: EM | Admit: 2015-06-17 | Discharge: 2015-06-26 | DRG: 872 | Disposition: A | Discharge status: Skilled Nursing Facility | Payer: No Typology Code available for payment source | Attending: Internal Medicine | Admitting: Internal Medicine

## 2015-06-17 ENCOUNTER — Other Ambulatory Visit: Payer: Self-pay

## 2015-06-17 ENCOUNTER — Emergency Department (HOSPITAL_COMMUNITY): Payer: No Typology Code available for payment source

## 2015-06-17 ENCOUNTER — Encounter (HOSPITAL_COMMUNITY): Payer: Self-pay | Admitting: Radiology

## 2015-06-17 DIAGNOSIS — Z6841 Body Mass Index (BMI) 40.0 and over, adult: Secondary | ICD-10-CM

## 2015-06-17 DIAGNOSIS — E785 Hyperlipidemia, unspecified: Secondary | ICD-10-CM | POA: Diagnosis present

## 2015-06-17 DIAGNOSIS — E876 Hypokalemia: Secondary | ICD-10-CM | POA: Diagnosis present

## 2015-06-17 DIAGNOSIS — Z8249 Family history of ischemic heart disease and other diseases of the circulatory system: Secondary | ICD-10-CM | POA: Diagnosis not present

## 2015-06-17 DIAGNOSIS — E662 Morbid (severe) obesity with alveolar hypoventilation: Secondary | ICD-10-CM | POA: Diagnosis present

## 2015-06-17 DIAGNOSIS — Z7901 Long term (current) use of anticoagulants: Secondary | ICD-10-CM

## 2015-06-17 DIAGNOSIS — I825Z9 Chronic embolism and thrombosis of unspecified deep veins of unspecified distal lower extremity: Secondary | ICD-10-CM | POA: Diagnosis present

## 2015-06-17 DIAGNOSIS — Z930 Tracheostomy status: Secondary | ICD-10-CM | POA: Diagnosis not present

## 2015-06-17 DIAGNOSIS — A4150 Gram-negative sepsis, unspecified: Secondary | ICD-10-CM | POA: Diagnosis present

## 2015-06-17 DIAGNOSIS — T40605A Adverse effect of unspecified narcotics, initial encounter: Secondary | ICD-10-CM | POA: Diagnosis not present

## 2015-06-17 DIAGNOSIS — R1032 Left lower quadrant pain: Secondary | ICD-10-CM | POA: Diagnosis present

## 2015-06-17 DIAGNOSIS — I5032 Chronic diastolic (congestive) heart failure: Secondary | ICD-10-CM

## 2015-06-17 DIAGNOSIS — A419 Sepsis, unspecified organism: Secondary | ICD-10-CM | POA: Diagnosis not present

## 2015-06-17 DIAGNOSIS — R140 Abdominal distension (gaseous): Secondary | ICD-10-CM | POA: Diagnosis present

## 2015-06-17 DIAGNOSIS — L0291 Cutaneous abscess, unspecified: Secondary | ICD-10-CM

## 2015-06-17 DIAGNOSIS — I10 Essential (primary) hypertension: Secondary | ICD-10-CM | POA: Diagnosis present

## 2015-06-17 DIAGNOSIS — Z79899 Other long term (current) drug therapy: Secondary | ICD-10-CM | POA: Diagnosis not present

## 2015-06-17 DIAGNOSIS — R0600 Dyspnea, unspecified: Secondary | ICD-10-CM

## 2015-06-17 DIAGNOSIS — K560 Paralytic ileus: Secondary | ICD-10-CM | POA: Diagnosis not present

## 2015-06-17 DIAGNOSIS — I82503 Chronic embolism and thrombosis of unspecified deep veins of lower extremity, bilateral: Secondary | ICD-10-CM | POA: Diagnosis not present

## 2015-06-17 DIAGNOSIS — I2510 Atherosclerotic heart disease of native coronary artery without angina pectoris: Secondary | ICD-10-CM | POA: Diagnosis present

## 2015-06-17 DIAGNOSIS — I110 Hypertensive heart disease with heart failure: Secondary | ICD-10-CM | POA: Diagnosis present

## 2015-06-17 DIAGNOSIS — E119 Type 2 diabetes mellitus without complications: Secondary | ICD-10-CM | POA: Diagnosis present

## 2015-06-17 DIAGNOSIS — J449 Chronic obstructive pulmonary disease, unspecified: Secondary | ICD-10-CM | POA: Diagnosis present

## 2015-06-17 DIAGNOSIS — F17210 Nicotine dependence, cigarettes, uncomplicated: Secondary | ICD-10-CM | POA: Diagnosis present

## 2015-06-17 DIAGNOSIS — K567 Ileus, unspecified: Secondary | ICD-10-CM | POA: Diagnosis present

## 2015-06-17 DIAGNOSIS — J9611 Chronic respiratory failure with hypoxia: Secondary | ICD-10-CM | POA: Diagnosis present

## 2015-06-17 DIAGNOSIS — R7881 Bacteremia: Secondary | ICD-10-CM | POA: Diagnosis not present

## 2015-06-17 DIAGNOSIS — I82402 Acute embolism and thrombosis of unspecified deep veins of left lower extremity: Secondary | ICD-10-CM | POA: Diagnosis not present

## 2015-06-17 DIAGNOSIS — M1990 Unspecified osteoarthritis, unspecified site: Secondary | ICD-10-CM | POA: Diagnosis present

## 2015-06-17 DIAGNOSIS — N390 Urinary tract infection, site not specified: Secondary | ICD-10-CM | POA: Diagnosis present

## 2015-06-17 DIAGNOSIS — I82729 Chronic embolism and thrombosis of deep veins of unspecified upper extremity: Secondary | ICD-10-CM | POA: Diagnosis not present

## 2015-06-17 DIAGNOSIS — Z720 Tobacco use: Secondary | ICD-10-CM | POA: Diagnosis present

## 2015-06-17 DIAGNOSIS — I82501 Chronic embolism and thrombosis of unspecified deep veins of right lower extremity: Secondary | ICD-10-CM | POA: Diagnosis not present

## 2015-06-17 DIAGNOSIS — D72829 Elevated white blood cell count, unspecified: Secondary | ICD-10-CM | POA: Diagnosis present

## 2015-06-17 DIAGNOSIS — I82509 Chronic embolism and thrombosis of unspecified deep veins of unspecified lower extremity: Secondary | ICD-10-CM | POA: Diagnosis present

## 2015-06-17 DIAGNOSIS — I5030 Unspecified diastolic (congestive) heart failure: Secondary | ICD-10-CM | POA: Diagnosis present

## 2015-06-17 DIAGNOSIS — T501X5A Adverse effect of loop [high-ceiling] diuretics, initial encounter: Secondary | ICD-10-CM | POA: Diagnosis present

## 2015-06-17 DIAGNOSIS — R791 Abnormal coagulation profile: Secondary | ICD-10-CM | POA: Diagnosis present

## 2015-06-17 DIAGNOSIS — B9620 Unspecified Escherichia coli [E. coli] as the cause of diseases classified elsewhere: Secondary | ICD-10-CM | POA: Diagnosis present

## 2015-06-17 DIAGNOSIS — A4151 Sepsis due to Escherichia coli [E. coli]: Secondary | ICD-10-CM | POA: Diagnosis present

## 2015-06-17 DIAGNOSIS — N412 Abscess of prostate: Secondary | ICD-10-CM | POA: Diagnosis not present

## 2015-06-17 DIAGNOSIS — N200 Calculus of kidney: Secondary | ICD-10-CM

## 2015-06-17 LAB — HEPATIC FUNCTION PANEL
ALBUMIN: 3.1 g/dL — AB (ref 3.5–5.0)
ALT: 29 U/L (ref 17–63)
AST: 25 U/L (ref 15–41)
Alkaline Phosphatase: 173 U/L — ABNORMAL HIGH (ref 38–126)
Bilirubin, Direct: 0.5 mg/dL (ref 0.1–0.5)
Indirect Bilirubin: 0.6 mg/dL (ref 0.3–0.9)
TOTAL PROTEIN: 7.9 g/dL (ref 6.5–8.1)
Total Bilirubin: 1.1 mg/dL (ref 0.3–1.2)

## 2015-06-17 LAB — URINALYSIS, ROUTINE W REFLEX MICROSCOPIC
Bilirubin Urine: NEGATIVE
GLUCOSE, UA: NEGATIVE mg/dL
Ketones, ur: NEGATIVE mg/dL
NITRITE: POSITIVE — AB
PH: 5.5 (ref 5.0–8.0)
Protein, ur: 30 mg/dL — AB
SPECIFIC GRAVITY, URINE: 1.021 (ref 1.005–1.030)
Urobilinogen, UA: 1 mg/dL (ref 0.0–1.0)

## 2015-06-17 LAB — BASIC METABOLIC PANEL
Anion gap: 10 (ref 5–15)
BUN: 24 mg/dL — AB (ref 6–20)
CHLORIDE: 101 mmol/L (ref 101–111)
CO2: 26 mmol/L (ref 22–32)
CREATININE: 0.9 mg/dL (ref 0.61–1.24)
Calcium: 9.6 mg/dL (ref 8.9–10.3)
GFR calc Af Amer: >60 mL/min (ref >60)
GFR calc non Af Amer: >60 mL/min (ref >60)
Glucose, Bld: 137 mg/dL — ABNORMAL HIGH (ref 65–99)
POTASSIUM: 3.3 mmol/L — AB (ref 3.5–5.1)
SODIUM: 137 mmol/L (ref 135–145)

## 2015-06-17 LAB — DIFFERENTIAL
BASOS ABS: 0 10*3/uL (ref 0.0–0.1)
BASOS PCT: 0 %
EOS ABS: 0.1 10*3/uL (ref 0.0–0.7)
EOS PCT: 1 %
LYMPHS ABS: 0.9 10*3/uL (ref 0.7–4.0)
Lymphocytes Relative: 7 %
Monocytes Absolute: 0.9 10*3/uL (ref 0.1–1.0)
Monocytes Relative: 7 %
NEUTROS PCT: 85 %
Neutro Abs: 11.1 10*3/uL — ABNORMAL HIGH (ref 1.7–7.7)

## 2015-06-17 LAB — CBC WITH DIFFERENTIAL/PLATELET
BASOS ABS: 0 10*3/uL (ref 0.0–0.1)
Basophils Relative: 0 %
EOS ABS: 0.1 10*3/uL (ref 0.0–0.7)
EOS PCT: 1 %
HCT: 38.4 % — ABNORMAL LOW (ref 39.0–52.0)
Hemoglobin: 12.6 g/dL — ABNORMAL LOW (ref 13.0–17.0)
LYMPHS PCT: 9 %
Lymphs Abs: 1.1 10*3/uL (ref 0.7–4.0)
MCH: 28.7 pg (ref 26.0–34.0)
MCHC: 32.8 g/dL (ref 30.0–36.0)
MCV: 87.5 fL (ref 78.0–100.0)
MONO ABS: 0.9 10*3/uL (ref 0.1–1.0)
Monocytes Relative: 7 %
Neutro Abs: 10.5 10*3/uL — ABNORMAL HIGH (ref 1.7–7.7)
Neutrophils Relative %: 83 %
PLATELETS: 298 10*3/uL (ref 150–400)
RBC: 4.39 MIL/uL (ref 4.22–5.81)
RDW: 15.9 % — AB (ref 11.5–15.5)
WBC: 12.6 10*3/uL — ABNORMAL HIGH (ref 4.0–10.5)

## 2015-06-17 LAB — LACTIC ACID, PLASMA
LACTIC ACID, VENOUS: 1.2 mmol/L (ref 0.5–2.0)
Lactic Acid, Venous: 1.2 mmol/L (ref 0.5–2.0)

## 2015-06-17 LAB — CK: CK TOTAL: 79 U/L (ref 49–397)

## 2015-06-17 LAB — URINE MICROSCOPIC-ADD ON

## 2015-06-17 LAB — PROTIME-INR
INR: 3.15 — AB (ref 0.00–1.49)
PROTHROMBIN TIME: 31.7 s — AB (ref 11.6–15.2)

## 2015-06-17 LAB — POC OCCULT BLOOD, ED: FECAL OCCULT BLD: POSITIVE — AB

## 2015-06-17 LAB — CBC
HEMATOCRIT: 41 % (ref 39.0–52.0)
Hemoglobin: 13.6 g/dL (ref 13.0–17.0)
MCH: 28.6 pg (ref 26.0–34.0)
MCHC: 33.2 g/dL (ref 30.0–36.0)
MCV: 86.1 fL (ref 78.0–100.0)
PLATELETS: 282 10*3/uL (ref 150–400)
RBC: 4.76 MIL/uL (ref 4.22–5.81)
RDW: 15.6 % — ABNORMAL HIGH (ref 11.5–15.5)
WBC: 12.6 10*3/uL — AB (ref 4.0–10.5)

## 2015-06-17 MED ORDER — PANTOPRAZOLE SODIUM 40 MG IV SOLR
40.0000 mg | Freq: Two times a day (BID) | INTRAVENOUS | Status: DC
  Administered 2015-06-17 – 2015-06-19 (×5): 40 mg via INTRAVENOUS
  Filled 2015-06-17 (×5): qty 40

## 2015-06-17 MED ORDER — ISOSORBIDE DINITRATE 20 MG PO TABS
20.0000 mg | ORAL_TABLET | Freq: Two times a day (BID) | ORAL | Status: DC
  Filled 2015-06-17: qty 1

## 2015-06-17 MED ORDER — PIPERACILLIN-TAZOBACTAM 3.375 G IVPB
3.3750 g | Freq: Three times a day (TID) | INTRAVENOUS | Status: DC
  Administered 2015-06-18 – 2015-06-21 (×11): 3.375 g via INTRAVENOUS
  Filled 2015-06-17 (×10): qty 50

## 2015-06-17 MED ORDER — IOHEXOL 300 MG/ML  SOLN
100.0000 mL | Freq: Once | INTRAMUSCULAR | Status: AC | PRN
  Administered 2015-06-17: 100 mL via INTRAVENOUS

## 2015-06-17 MED ORDER — IPRATROPIUM-ALBUTEROL 0.5-2.5 (3) MG/3ML IN SOLN: 3.0000 mL | Freq: Four times a day (QID) | RESPIRATORY_TRACT | Status: DC | PRN

## 2015-06-17 MED ORDER — ONDANSETRON HCL 4 MG/2ML IJ SOLN
4.0000 mg | Freq: Once | INTRAMUSCULAR | Status: AC
  Administered 2015-06-17: 4 mg via INTRAVENOUS
  Filled 2015-06-17: qty 2

## 2015-06-17 MED ORDER — SODIUM CHLORIDE 0.9 % IV SOLN
INTRAVENOUS | Status: DC
  Administered 2015-06-18: 17:00:00 via INTRAVENOUS

## 2015-06-17 MED ORDER — HYDRALAZINE HCL 25 MG PO TABS
37.5000 mg | ORAL_TABLET | Freq: Two times a day (BID) | ORAL | Status: DC
  Filled 2015-06-17: qty 1.5

## 2015-06-17 MED ORDER — PHYTONADIONE 1 MG/0.5 ML ORAL SOLUTION
1.0000 mg | Freq: Once | ORAL | Status: AC
  Administered 2015-06-17: 1 mg via ORAL
  Filled 2015-06-17: qty 0.5

## 2015-06-17 MED ORDER — VANCOMYCIN HCL IN DEXTROSE 1-5 GM/200ML-% IV SOLN
1000.0000 mg | Freq: Once | INTRAVENOUS | Status: AC
Start: 2015-06-17 — End: 2015-06-17
  Administered 2015-06-17: 1000 mg via INTRAVENOUS
  Filled 2015-06-17: qty 200

## 2015-06-17 MED ORDER — PIPERACILLIN-TAZOBACTAM 3.375 G IVPB
3.3750 g | Freq: Once | INTRAVENOUS | Status: AC
  Administered 2015-06-17: 3.375 g via INTRAVENOUS
  Filled 2015-06-17: qty 50

## 2015-06-17 MED ORDER — SODIUM CHLORIDE 0.9 % IV SOLN
Freq: Once | INTRAVENOUS | Status: AC
  Administered 2015-06-17: 19:00:00 via INTRAVENOUS

## 2015-06-17 NOTE — Progress Notes (Addendum)
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 Elease Hashimoto noted pt's living condition- not getting off sofa since [facility] internal med appt  EPIC updated   ED CM observed pt ask ED CNA after she took his vital signs about having someone to clean his trach  Cm noted pt trach with moderate light brown secretions on dressing and his t-shirt collar -Pt informed CNA he had not cleaned site today  Pt informed CM when asked which home health agency visits him, that Elease Hashimoto knows but he only has supplies and no one comes to change dressing Reports he changes the dressing. CM notes the dressing does not appear to be have been changed for 1-2 days  1405 ED CM called [phone] spoke with "Patty" (patricia work comp [employer]) would confirms she has been working with pt related to the work comp injury to his shoulder States she spoke with Debbie in coumadin clinic & Dr Ala Dach about the pt last week and voiced concerns   Home care connect been used to coordinate pt's previous home care.  Alexia Freestone states Mr "BJ" at [phone] can assist if needed  Tia Turner from [facility] has been called by Alexia Freestone. Tia is out of office and refers calls to her Supervisor, Darryl; Patty spoke with Darryl today  Patty Confirms pt has an 5 yr old neighbor only that visits Confirms no medicare and medicaid, no Environmental education officer Had assisted/gotten approved previous home health services There was a Rn 3 times a week Pt was ambulatory with RW when left snf and he had told Patty his goal was to get in his truck and drive Had a RW Confirmed pr has only been home 3 weeks but baseline  has  declined in last 3 weeks Had PT outpatient services  Transportation is an issue for this uninsured pt. Patty confirms pt with compliance, hygiene issues noted during her visits to his home Pt has bedside commode   His Brother died recently and Patty states pt can not afford where he is living. Patty states pt's brother reported that pt has "never been motivated, would work a few days and stay home." " the brother use to pay all the bills" Pt has an attorney per "Patty" but has not helped much Patty welcomes call to assist as much as possible  Cm updated ED charge that pt has been taken care of by [facility] internal medicine providers

## 2015-06-17 NOTE — Consult Note (Signed)
 @ 6:49 PM   [name] 26-Dec-1950 [id number]  Referring provider: Dr. Clarene Duke  Chief Complaint  Patient presents with  . Immobile since 10/4, Abd Pain, Back Pain     HPI: The patient is a 64 year old gentleman with multiple medical comorbidities ncluding morbid obesity, DM, trachea dependent,COPD, CHF, and bedridden whopresented last with left lower quadrant pain and was found to havan abscess near his prostate with air in his bladder and into his left collecting system  He also had bilateral nonobstructive stones.he denies any significant urinary history.  He claims he is able to void well at baseline and feels he empties his bladder.  He has never had a UTI.  He has never had prostate problems.  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   PMH: Past Medical History  Diagnosis Date  . Arthritis   . RCT (rotator cuff tear)   . Multiple abrasions     rt arm  . Morbid obesity (HCC)   . Hypertension   . Hyperlipidemia   . COPD (chronic obstructive pulmonary disease) (HCC)   . CHF (congestive heart failure) [facility])     Surgical History: Past Surgical History  Procedure Laterality Date  . No past surgeries    . Shoulder arthroscopy with subacromial decompression, rotator cuff repair and bicep tendon repair Right 12/28/2014    Procedure: RIGHT SHOULDER ARTHROSCOPY WITH SUBACROMIAL DECOMPRESSION, DISTAL CLAVICLE RESECTION, ROTATOR CUFF REPAIR ;  Surgeon: Eugenia Mcalpine, MD;  Location: [facility];  Service: Orthopedics;  Laterality: Right;  . Tracheostomy tube placement N/A 01/16/2015    Procedure: TRACHEOSTOMY;  Surgeon: Flo Shanks, MD;  Location: [facility] OR;  Service: ENT;  Laterality: N/A;  . Tracheostomy revision N/A 01/18/2015    Procedure: TRACHEOSTOMY REVISION;  Surgeon: Melvenia Beam, MD;  Location: [facility] OR;  Service: ENT;  Laterality: N/A;  . Tracheostomy tube placement N/A 01/22/2015    Procedure:  TRACHEOSTOMY;  Surgeon: Flo Shanks, MD;  Location: [facility] OR;  Service: ENT;  Laterality: N/A;  . [id number]      Home Medications:    Medication List    ASK your doctor about these medications        clonazePAM 0.5 MG tablet  Commonly known as:  KLONOPIN  Take 0.5 mg by mouth 3 (three) times daily.     famotidine 20 MG tablet  Commonly known as:  PEPCID  Take 20 mg by mouth daily.     Fish Oil 1000 MG Caps  Take 1,000 mg by mouth daily.     folic acid 1 MG tablet  Commonly known as:  FOLVITE  Take 1 mg by mouth daily.     furosemide 40 MG tablet  Commonly known as:  LASIX  Take 1 tablet (40 mg total) by mouth daily.     hydrALAZINE 25 MG tablet  Commonly known as:  APRESOLINE  Take 37.5 mg by mouth 2 (two) times daily.     ipratropium-albuterol 0.5-2.5 (3) MG/3ML Soln  Commonly known as:  DUONEB  Take 3 mLs by nebulization every 6 (six) hours as needed.     isosorbide dinitrate 20 MG tablet  Commonly known as:  ISORDIL  Take 20 mg by mouth 2 (two) times daily.     lidocaine 5 %  Commonly known as:  LIDODERM  Place 1 patch onto the skin daily. Remove & Discard patch within 12 hours or as directed by MD  To right shoulder     metoCLOPramide 10 MG tablet  Commonly known as:  REGLAN  Take 10 mg by mouth 4 (four) times daily -  before meals and at bedtime.     metolazone 5 MG tablet  Commonly known as:  ZAROXOLYN  Take 5 mg by mouth daily.     ondansetron 4 MG tablet  Commonly known as:  ZOFRAN  Take 4 mg by mouth every 6 (six) hours.     potassium chloride SA 20 MEQ tablet  Commonly known as:  K-DUR,KLOR-CON  Take 2 tablets (40 mEq total) by mouth 4 (four) times daily.     sertraline 100 MG tablet  Commonly known as:  ZOLOFT  Take 100 mg by mouth daily.     warfarin 10 MG tablet  Commonly known as:  COUMADIN  Take 10 mg by mouth daily.        Allergies: No Known Allergies  Family History: Family History  Problem Relation Age of Onset  . Heart disease  Mother   . Heart attack Mother   . Heart disease Brother     Social History:  reports that he has been smoking Cigarettes.  He has been smoking about 0.20 packs per day. He does not have any smokeless tobacco history on file. He reports that he does not drink alcohol or use illicit drugs.  ROS: 12 point ROS review as in patient's chart and agreed upon.                                        Physical Exam: BP 134/76 mmHg  Pulse 82  Temp(Src) 98.9 F (37.2 C) (Oral)  Resp 21  SpO2 93%  Constitutional:  Alert and oriented, No acute distress. HEENT: [place] AT, moist mucus membranes.  Trachea midline, no masses. Cardiovascular: No clubbing, cyanosis, or edema. Respiratory: Normal respiratory effort, no increased work of breathing. GI: Abdomen is soft, nontender, nondistended, no abdominal masses GU: No CVA tenderness. Atrophic left testicle with compensatory hypertrophy of the right testicle.  Sebaceous cyst noted on the anterior scrotum. DRE: deferred 2/2 risk of hematogenous spread of infection Skin: No rashes, bruises or suspicious lesions. Lymph: No cervical or inguinal adenopathy. Neurologic: Grossly intact, no focal deficits, moving all 4 extremities. Psychiatric: Normal mood and affect.  Laboratory Data: Lab Results  Component Value Date   WBC 12.6* 06/17/2015   HGB 13.6 06/17/2015   HCT 41.0 06/17/2015   MCV 86.1 06/17/2015   PLT 282 06/17/2015    Lab Results  Component Value Date   CREATININE 0.90 06/17/2015    No results found for: PSA  No results found for: TESTOSTERONE  Lab Results  Component Value Date   HGBA1C 6.3* 02/13/2015    Urinalysis    Component Value Date/Time   COLORURINE AMBER* 06/17/2015 1407   APPEARANCEUR TURBID* 06/17/2015 1407   LABSPEC 1.021 06/17/2015 1407   PHURINE 5.5 06/17/2015 1407   GLUCOSEU NEGATIVE 06/17/2015 1407   HGBUR LARGE* 06/17/2015 1407   BILIRUBINUR NEGATIVE 06/17/2015 1407   KETONESUR NEGATIVE  06/17/2015 1407   PROTEINUR 30* 06/17/2015 1407   UROBILINOGEN 1.0 06/17/2015 1407   NITRITE POSITIVE* 06/17/2015 1407   LEUKOCYTESUR MODERATE* 06/17/2015 1407    Pertinent Imaging:  CLINICAL DATA: Nausea and vomiting. Back pain and swelling in legs. Possible urine stool incontinence. Morbid obesity. COPD. Congestive heart failure.  EXAM: CT ABDOMEN  AND PELVIS WITH CONTRAST  TECHNIQUE: Multidetector CT imaging of the abdomen and pelvis was performed using the standard protocol following bolus administration of intravenous contrast.  CONTRAST: OMNIPAQUE IOHEXOL 300 MG/ML SOLN  COMPARISON: 01/28/2015  FINDINGS: Lower chest: Motion degradation throughout. Grossly clear lung bases. Mild cardiomegaly. Multivessel coronary artery atherosclerosis.  Hepatobiliary: Mild hepatic steatosis, without focal liver lesion. Normal gallbladder, without biliary ductal dilatation.  Pancreas: Normal, without mass or ductal dilatation. Periampullary duodenal diverticulum.  Spleen: Normal in size, without focal abnormality.  Adrenals/Urinary Tract: Normal adrenal glands. Bilateral renal collecting system calculi. Largest stone is in the interpolar left kidney at 11 mm.  Too small to characterize interpolar left renal lesion. A lower pole right renal lesion measures 13 mm and is likely a cyst.  Air within the left renal collecting system and left ureter proximally. There is edema along the course of the left ureter, including at the left pelvic brim on image/series 74/2. Air is identified within the urinary bladder. No hydroureter or ureteric stone seen.  Stomach/Bowel: Mild gastric distension. Descending duodenal diverticulum. Normal terminal ileum and appendix. Small bowel loops are fluid-filled, measuring up to 3.7 cm. No transition point is identified. No pneumatosis or free intraperitoneal air. Colon is gas and fluid filled. Scattered colonic diverticula. No  colonic wall thickening.  Vascular/Lymphatic: Aortic and branch vessel atherosclerosis. Small retroperitoneal nodes are not pathologic by size criteria. IVC filter is appropriately positioned. No pelvic adenopathy.  Reproductive: Multiloculated fluid collection or collections centered about the right-side of the prostate and pelvic cul-de-sac. In conglomerate, measure on the order of 5.5 x 5.9 cm on image/series 94/2.  Other: No significant free fluid.  Musculoskeletal: Degenerative partial fusion of the bilateral sacroiliac joints.  IMPRESSION: 1. Peripherally enhancing fluid collection or collections centered about the right-side of the prostate and pelvic cul-de-sac. Suspicious for abscess/ abscesses. 2. Air within the urinary bladder and left renal collecting system/ureter. Suspicious for ascending infection. Edema in the left hemipelvis, along the course of the left ureter, is likely secondary. 3. Fluid-filled large and small bowel loops are suspicious for adynamic ileus. No focal transition point identified. 4. Bilateral nephrolithiasis. 5. Atherosclerosis, including within the coronary arteries. 6. Mild motion degradation.    Assessment & Plan:    1.  Prostatic abscess The abscess appears to be periprostatic but likely has some connection to the prostate and therefore the urinary tract given the air within the urinary bladder and left collecting system.   I would avoid instrumentation (including foley placement) of the urinary tract, as the patient is not currently has septic, until until several doses of antibiotic having given.  Instrumentation risks hematogenous spread of bacteria. -Recommend interventional radiology consult to assess ability to percutaneously drain this abscess.  This patient is a poor surgical candidate so less invasive procedures are ideal. -Recommend broad spectrum antibiotic coverage -Urine culture -Monitor vitals, WBC -Monitor Cr,  UOP -recommend reversal of patient's INR in preparation for possible intervention in the future  2.  Air in the left collecting system and bladder -see above  3.  Nonobstructing nephrolithiasis -No further workup this time.  This can be addressed in the nonacute setting.  Will follow   Hildred Laser, MD

## 2015-06-17 NOTE — Telephone Encounter (Signed)
Called on pt came from [place] - work comp Engineer, civil (consulting) [phone] - very concerned about pt. Pt has not left sofa since appt in clinic last week. She believes he is inc urine and stool since he can not make it to the bathroom. His brother died this summer from liver cancer - they lived together. Brother did everything for pt. Pt did not have a phone or TV. Patty and her husband got TV working and affordable phone. Not eating and may not be taking meds. Pt is having n/v, back pain and swelling in legs. An 62 year old male neighbor checks on pt. Unable to get meals. Suggested to pt to call 911 to send out EMT to take pt to [facility] ER. Will do INR at that time. Dr Selena Batten and Tobi Bastos aware of this pt. Has no insurance at this time. Pt will need to see [facility]. Patty states pt was independent till 12/2014 till shoulder injury. Patty even called Adult protective services recently on pt. Stanton Kidney Marketta Valadez RN 06/17/15 12:10PM

## 2015-06-17 NOTE — Clinical Social Work Note (Signed)
Clinical Social Work Assessment  Patient Details  Name: [name] MRN: [id number] Date of Birth: April 23, 1951  Date of referral:  06/17/15               Reason for consult:   (Patient has been d/c from [employer].)                Permission sought to share information with:   (None.) Permission granted to share information::  No  Name::        Agency::     Relationship::     Contact Information:     Housing/Transportation Living arrangements for the past 2 months:  Single Family Home Source of Information:  Patient Patient Interpreter Needed:  None Criminal Activity/Legal Involvement Pertinent to [place] Situation/Hospitalization:  No - Comment as needed Significant Relationships:  Friend Lives with:  Self Do you feel safe going back to the place where you live?  No (Patient states that he does not want to go home in his [place] conditon. Patient expressed that he feels he needds assistance while at home.) Need for family participation in patient care:  Yes (Comment) (Patient states that he has a friend who comes to help him sometimes. However, he states that he does not have good  family support.)  Care giving concerns:  Patient confirms that he lives home alone in [place], [place]. CSW asked patient about his living conditions and trouble cleaning.   Patient states that he has been unable to clean his home due to him having to walk with a cane and not get around well. Patient states that he feels he needs assistance with cleaning his house. Patient states that last week his friend came to his home and washed his clothes, cleaned the kitchen, and hallway. However, he states that his friend will not be able to come every week.   Social Worker assessment / plan:  Patient confirms that he presents to [facility] due to abdominal pain. Also, he states that his back is hurting. Patient states that his back was been hurting for a while, but says his stomach pain has been going on for the past  3-4 days.  Patient states that he does not fall often. He says that he has fallen x1 in the past 6 months. Patient informed CSW that he does not feel as though he has a good support system with family.  Patient states that he is employed at [employer], and works 2 days a week. However, he has not been to work this month. Patient stated " They're why Im in here".   Employment status:  Unemployed Insurance information:   Wellsite geologist? However, CSW spoke with Nurse CM who states his insurance may not be active.) PT Recommendations:  Not assessed at this time Information / Referral to community resources:   (CSW checked notes, which state that an APS report has been filed on the patient regarding his living conditions.)  Patient/Family's Response to care:  Patient is aware that he will be admitted. Patient is accepting at this time.  Patient/Family's Understanding of and Emotional Response to Diagnosis, [place] Treatment, and Prognosis:  Patient is understanding at this time. He states that he does not have any questions for CSW.  Emotional Assessment Appearance:  Appears stated age Attitude/Demeanor/Rapport:   (Appropriate.) Affect (typically observed):  Accepting Orientation:  Oriented to Self, Oriented to Place, Oriented to  Time, Oriented to Situation Alcohol / Substance use:  Not Applicable Psych involvement ([place] and /or in  the community):  No (Comment)  Discharge Needs  Concerns to be addressed:  Adjustment to Illness Readmission within the last 30 days:  No [place] discharge risk:  None Barriers to Discharge:  No Barriers Identified   Alene Mires, LCSW 06/17/2015, 7:23 PM

## 2015-06-17 NOTE — ED Provider Notes (Signed)
CSN: [id number]     Arrival date & time 06/17/15  1309 History   First MD Initiated Contact with Patient 06/17/15 1508     Chief Complaint  Patient presents with  . Immobile since 10/4, Abd Pain, Back Pain      (Consider location/radiation/quality/duration/timing/severity/associated sxs/prior Treatment) HPI Comments: Patient with a history of CHF, HTN, DVT (finished Coumadin, no longer taking, per patient), s/p tracheostomy secondary to complications of anesthesia during last surgery in April, presents with LLQ abdominal pain and lower back pain. He reports to EMS that he has been unable to get up from the couch since 06/13/15 due to weakness. He denies fever. He states he has been vomiting an unknown number of times daily. No diarrhea. Normal bowel movements until 2-3 days ago when he started having constipation, but also reports last bowel movement earlier today while on the couch. He denies history of bowel problems, previous abdominal surgeries or previous colonoscopies. No fever, LE pain, SOB, cough or chest pain.  The history is provided by the patient. No language interpreter was used.    Past Medical History  Diagnosis Date  . Arthritis   . RCT (rotator cuff tear)   . Multiple abrasions     rt arm  . Morbid obesity (HCC)   . Hypertension   . Hyperlipidemia   . COPD (chronic obstructive pulmonary disease) (HCC)   . CHF (congestive heart failure) [facility])    Past Surgical History  Procedure Laterality Date  . No past surgeries    . Shoulder arthroscopy with subacromial decompression, rotator cuff repair and bicep tendon repair Right 12/28/2014    Procedure: RIGHT SHOULDER ARTHROSCOPY WITH SUBACROMIAL DECOMPRESSION, DISTAL CLAVICLE RESECTION, ROTATOR CUFF REPAIR ;  Surgeon: Eugenia Mcalpine, MD;  Location: [facility];  Service: Orthopedics;  Laterality: Right;  . Tracheostomy tube placement N/A 01/16/2015    Procedure: TRACHEOSTOMY;  Surgeon: Flo Shanks, MD;  Location: [facility] OR;  Service:  ENT;  Laterality: N/A;  . Tracheostomy revision N/A 01/18/2015    Procedure: TRACHEOSTOMY REVISION;  Surgeon: Melvenia Beam, MD;  Location: [facility] OR;  Service: ENT;  Laterality: N/A;  . Tracheostomy tube placement N/A 01/22/2015    Procedure: TRACHEOSTOMY;  Surgeon: Flo Shanks, MD;  Location: [facility] OR;  Service: ENT;  Laterality: N/A;   Family History  Problem Relation Age of Onset  . Heart disease Mother   . Heart attack Mother   . Heart disease Brother    Social History  Substance Use Topics  . Smoking status: [place] Every Day Smoker -- 0.20 packs/day    Types: Cigarettes  . Smokeless tobacco: Not on file  . Alcohol Use: No    Review of Systems  Constitutional: Negative for fever and chills.  Respiratory: Negative.  Negative for shortness of breath.   Cardiovascular: Negative.  Negative for chest pain.  Gastrointestinal: Positive for nausea, vomiting, abdominal pain and constipation. Negative for diarrhea and abdominal distention.  Genitourinary: Negative.  Negative for dysuria and difficulty urinating.  Musculoskeletal: Positive for back pain.  Skin: Negative.  Negative for wound.  Neurological: Positive for weakness.      Allergies  Review of patient's allergies indicates no known allergies.  Home Medications   Prior to Admission medications   Medication Sig Start Date End Date Taking? Authorizing Provider  furosemide (LASIX) 40 MG tablet Take 1 tablet (40 mg total) by mouth daily. 06/13/15  Yes Ruben Im, MD  ipratropium-albuterol (DUONEB) 0.5-2.5 (3) MG/3ML SOLN Take 3 mLs by nebulization every  6 (six) hours as needed.   Yes Historical Provider, MD  isosorbide dinitrate (ISORDIL) 20 MG tablet Take 20 mg by mouth 2 (two) times daily.   Yes Historical Provider, MD  lidocaine (LIDODERM) 5 % Place 1 patch onto the skin daily. Remove & Discard patch within 12 hours or as directed by MD To right shoulder   Yes Historical Provider, MD  potassium chloride SA (K-DUR,KLOR-CON)  20 MEQ tablet Take 2 tablets (40 mEq total) by mouth 4 (four) times daily. 04/22/15  Yes Sharee Holster, NP  sertraline (ZOLOFT) 100 MG tablet Take 100 mg by mouth daily.   Yes Historical Provider, MD  warfarin (COUMADIN) 10 MG tablet Take 10 mg by mouth daily. 05/31/15  Yes Historical Provider, MD  clonazePAM (KLONOPIN) 0.5 MG tablet Take 0.5 mg by mouth 3 (three) times daily.    Historical Provider, MD  famotidine (PEPCID) 20 MG tablet Take 20 mg by mouth daily.    Historical Provider, MD  folic acid (FOLVITE) 1 MG tablet Take 1 mg by mouth daily.    Historical Provider, MD  hydrALAZINE (APRESOLINE) 25 MG tablet Take 37.5 mg by mouth 2 (two) times daily.    Historical Provider, MD  metoCLOPramide (REGLAN) 10 MG tablet Take 10 mg by mouth 4 (four) times daily -  before meals and at bedtime.    Historical Provider, MD  metolazone (ZAROXOLYN) 5 MG tablet Take 5 mg by mouth daily.    Historical Provider, MD  Omega-3 Fatty Acids (FISH OIL) 1000 MG CAPS Take 1,000 mg by mouth daily.     Historical Provider, MD  ondansetron (ZOFRAN) 4 MG tablet Take 4 mg by mouth every 6 (six) hours.    Historical Provider, MD   BP 113/65 mmHg  Pulse 84  Temp(Src) 98.9 F (37.2 C) (Oral)  Resp 18  SpO2 98% Physical Exam  Constitutional: He is oriented to person, place, and time. He appears well-developed and well-nourished.  HENT:  Head: Normocephalic.  Tracheostomy in place, site unremarkable.   Eyes: Conjunctivae are normal.  Neck: Normal range of motion. Neck supple.  Cardiovascular: Normal rate and regular rhythm.   Pulmonary/Chest: Effort normal and breath sounds normal.  Abdominal: Soft. There is tenderness. There is no rebound and no guarding.  Morbidly obese abdomen. Tense. LLQ abdominal tenderness. Hypertympanic BS  Musculoskeletal: Normal range of motion.  3+ pitting edema in bilateral LE's with non-weeping blisters of venous stasis, also bilateral LE's.   Neurological: He is alert and oriented to  person, place, and time. He has normal strength. Coordination normal. GCS eye subscore is 4. GCS verbal subscore is 5. GCS motor subscore is 6.  Speech clear and focused. CN's 3-12 grossly intact.  Skin: Skin is warm and dry. No rash noted.  Psychiatric: He has a normal mood and affect.    ED Course  Procedures (including critical care time) Labs Review Labs Reviewed  BASIC METABOLIC PANEL - Abnormal; Notable for the following:    Potassium 3.3 (*)    Glucose, Bld 137 (*)    BUN 24 (*)    All other components within normal limits  CBC - Abnormal; Notable for the following:    WBC 12.6 (*)    RDW 15.6 (*)    All other components within normal limits  URINALYSIS, ROUTINE W REFLEX MICROSCOPIC (NOT AT [facility]) - Abnormal; Notable for the following:    Color, Urine AMBER (*)    APPearance TURBID (*)    Hgb urine  dipstick LARGE (*)    Protein, ur 30 (*)    Nitrite POSITIVE (*)    Leukocytes, UA MODERATE (*)    All other components within normal limits  URINE MICROSCOPIC-ADD ON - Abnormal; Notable for the following:    Bacteria, UA FEW (*)    All other components within normal limits   . Imaging Review No results found. I have personally reviewed and evaluated these images and lab results as part of my medical decision-making.   EKG Interpretation None      MDM   Final diagnoses:  None    1. Prostate abscess 2. UTI 3. GI bleeding  Mild leukocytosis. INR elevated indicating likely continuation of coumadin.   CT scan showing prostate abscess with air in bladder and left renal collecting system suspicious for ascending infection. Urology consulted. Plan to admit to medicine with appropriate consultations. Patient is currently comfortable, sleeping well, no vomiting, VSS.  Discussed with Dr. Elvina Mattes (urology) who will consult in ED. [facility] accepts patient for admission.   Elpidio Anis, PA-C 06/20/15 2257  Laurence Spates, MD 06/21/15 [phone]

## 2015-06-17 NOTE — H&P (Signed)
History and Physical  Milus Buresh ZOX:[id number] DOB: 1951/05/29 DOA: 06/17/2015  PCP: Ruben Im, MD   Chief Complaint: LLQ pain  History of Present Illness:  Patient with history of chronic DVT s/p IVC filter on coumadin, COPD, diastolic HF,HTN with recent admission with respiratory failure s/p trach who presented with LLQ pain : started 4 days ago, getting worse, no fever or chills, with diarrhea, no vomiting or nausea, with brownish urine and dysuria. He also has had increased brownish secretions from his trach but no dyspnea or chest pain.LL edema has been worsening.INR has been supra-therapeutic: he was told to hold Coumadin last Thursday as INR was 6.8 in clinic. No other complaints. No history of UTIs or prostatitis or abdominal surgeries. No trauma.   Review of Systems:  CONSTITUTIONAL:  No night sweats.  No fatigue, malaise, lethargy.  No fever or chills. Eyes:  No visual changes.  No eye pain.  No eye discharge.   ENT:    No epistaxis.  No sinus pain.  No sore throat.  No ear pain.  No congestion. RESPIRATORY:  +cough.  No wheeze.  No hemoptysis.  No shortness of breath. CARDIOVASCULAR:  No chest pains.  No palpitations. GASTROINTESTINAL:  +abdominal pain.  No nausea or vomiting.  +diarrhea.  No hematemesis.  No hematochezia.  No melena. GENITOURINARY:  No urgency.  No frequency.  +dysuria.  No hematuria.  No obstructive symptoms.  No discharge.  +pain.  No significant abnormal bleeding. MUSCULOSKELETAL:  No musculoskeletal pain.  No joint swelling.  No arthritis. NEUROLOGICAL:  No confusion.  No weakness. No headache. No seizure. PSYCHIATRIC:  No depression. No anxiety. No suicidal ideation. SKIN:  No rashes.  No lesions.  No wounds. ENDOCRINE:  No unexplained weight loss.  No polydipsia.  No polyuria.  No polyphagia. HEMATOLOGIC:  No anemia.  No purpura.  No petechiae.  No bleeding.  ALLERGIC AND IMMUNOLOGIC:  No pruritus.  No swelling Other:  Past Medical and  Surgical History:   Past Medical History  Diagnosis Date  . Arthritis   . RCT (rotator cuff tear)   . Multiple abrasions     rt arm  . Morbid obesity (HCC)   . Hypertension   . Hyperlipidemia   . COPD (chronic obstructive pulmonary disease) (HCC)   . CHF (congestive heart failure) [facility])    Past Surgical History  Procedure Laterality Date  . No past surgeries    . Shoulder arthroscopy with subacromial decompression, rotator cuff repair and bicep tendon repair Right 12/28/2014    Procedure: RIGHT SHOULDER ARTHROSCOPY WITH SUBACROMIAL DECOMPRESSION, DISTAL CLAVICLE RESECTION, ROTATOR CUFF REPAIR ;  Surgeon: Eugenia Mcalpine, MD;  Location: [facility] ORS;  Service: Orthopedics;  Laterality: Right;  . Tracheostomy tube placement N/A 01/16/2015    Procedure: TRACHEOSTOMY;  Surgeon: Flo Shanks, MD;  Location[facility];  Service: ENT;  Laterality: N/A;  . Tracheostomy revision N/A 01/18/2015    Procedure: TRACHEOSTOMY REVISION;  Surgeon: Melvenia Beam, MD;  Location: [facility] OR;  Service: ENT;  Laterality: N/A;  . Tracheostomy tube placement N/A 01/22/2015    Procedure: TRACHEOSTOMY;  Surgeon: Flo Shanks, MD;  Location: [facility] OR;  Service: ENT;  Laterality: N/A;  . [id number]      Social History:   reports that he has been smoking Cigarettes.  He has been smoking about 0.20 packs per day. He does not have any smokeless tobacco history on file. He reports that he does not drink alcohol or use illicit drugs.   No  Known Allergies  Family History  Problem Relation Age of Onset  . Heart disease Mother   . Heart attack Mother   . Heart disease Brother      Prior to Admission medications   Medication Sig Start Date End Date Taking? Authorizing Provider  furosemide (LASIX) 40 MG tablet Take 1 tablet (40 mg total) by mouth daily. 06/13/15  Yes Ruben Im, MD  ipratropium-albuterol (DUONEB) 0.5-2.5 (3) MG/3ML SOLN Take 3 mLs by nebulization every 6 (six) hours as needed.   Yes Historical Provider, MD  isosorbide  dinitrate (ISORDIL) 20 MG tablet Take 20 mg by mouth 2 (two) times daily.   Yes Historical Provider, MD  lidocaine (LIDODERM) 5 % Place 1 patch onto the skin daily. Remove & Discard patch within 12 hours or as directed by MD To right shoulder   Yes Historical Provider, MD  potassium chloride SA (K-DUR,KLOR-CON) 20 MEQ tablet Take 2 tablets (40 mEq total) by mouth 4 (four) times daily. 04/22/15  Yes Sharee Holster, NP  sertraline (ZOLOFT) 100 MG tablet Take 100 mg by mouth daily.   Yes Historical Provider, MD  warfarin (COUMADIN) 10 MG tablet Take 10 mg by mouth daily. 05/31/15  Yes Historical Provider, MD  clonazePAM (KLONOPIN) 0.5 MG tablet Take 0.5 mg by mouth 3 (three) times daily.    Historical Provider, MD  famotidine (PEPCID) 20 MG tablet Take 20 mg by mouth daily.    Historical Provider, MD  folic acid (FOLVITE) 1 MG tablet Take 1 mg by mouth daily.    Historical Provider, MD  hydrALAZINE (APRESOLINE) 25 MG tablet Take 37.5 mg by mouth 2 (two) times daily.    Historical Provider, MD  metoCLOPramide (REGLAN) 10 MG tablet Take 10 mg by mouth 4 (four) times daily -  before meals and at bedtime.    Historical Provider, MD  metolazone (ZAROXOLYN) 5 MG tablet Take 5 mg by mouth daily.    Historical Provider, MD  Omega-3 Fatty Acids (FISH OIL) 1000 MG CAPS Take 1,000 mg by mouth daily.     Historical Provider, MD  ondansetron (ZOFRAN) 4 MG tablet Take 4 mg by mouth every 6 (six) hours.    Historical Provider, MD    Physical Exam: BP 126/74 mmHg  Pulse 88  Temp(Src) 98.9 F (37.2 C) (Oral)  Resp 22  SpO2 94%  GENERAL : Well developed, well nourished, alert and cooperative, and appears to be in no acute distress. HEAD: normocephalic. EYES: PERRL, EOMI. EARS:  hearing grossly intact. NOSE: No nasal discharge. THROAT: Oral cavity and pharynx normal. NECK: trach in place. CARDIAC:distant sounds, no gallop or murmur, LE edema bilaterally. LUNGS: Clear to auscultation , decreased lung sounds  ( obesity) ABDOMEN: Positive bowel sounds. Soft, distended, tender to palpation diffusely with rebound in LLQ. EXTREMITIES: Bil LE edema. NEUROLOGICAL: The mental examination revealed the patient was oriented to person, place, and time.CN II-XII intact.  SKIN: Skin normal color, skin tags+ PSYCHIATRIC:  The patient was able to demonstrate good judgement and reason, without hallucinations, abnormal affect or abnormal behaviors during the examination. Patient is not suicidal.          Labs on Admission:  Reviewed.   Radiological Exams on Admission: Ct Abdomen Pelvis W Contrast  06/17/2015   CLINICAL DATA:  Nausea and vomiting. Back pain and swelling in legs. Possible urine stool incontinence. Morbid obesity. COPD. Congestive heart failure.  EXAM: CT ABDOMEN AND PELVIS WITH CONTRAST  TECHNIQUE: Multidetector CT imaging of the abdomen  and pelvis was performed using the standard protocol following bolus administration of intravenous contrast.  CONTRAST:  OMNIPAQUE IOHEXOL 300 MG/ML  SOLN  COMPARISON:  01/28/2015  FINDINGS: Lower chest: Motion degradation throughout. Grossly clear lung bases. Mild cardiomegaly. Multivessel coronary artery atherosclerosis.  Hepatobiliary: Mild hepatic steatosis, without focal liver lesion. Normal gallbladder, without biliary ductal dilatation.  Pancreas: Normal, without mass or ductal dilatation. Periampullary duodenal diverticulum.  Spleen: Normal in size, without focal abnormality.  Adrenals/Urinary Tract: Normal adrenal glands. Bilateral renal collecting system calculi. Largest stone is in the interpolar left kidney at 11 mm.  Too small to characterize interpolar left renal lesion. A lower pole right renal lesion measures 13 mm and is likely a cyst.  Air within the left renal collecting system and left ureter proximally. There is edema along the course of the left ureter, including at the left pelvic brim on image/series 74/2. Air is identified within the urinary  bladder. No hydroureter or ureteric stone seen.  Stomach/Bowel: Mild gastric distension. Descending duodenal diverticulum. Normal terminal ileum and appendix. Small bowel loops are fluid-filled, measuring up to 3.7 cm. No transition point is identified. No pneumatosis or free intraperitoneal air. Colon is gas and fluid filled. Scattered colonic diverticula. No colonic wall thickening.  Vascular/Lymphatic: Aortic and branch vessel atherosclerosis. Small retroperitoneal nodes are not pathologic by size criteria. IVC filter is appropriately positioned. No pelvic adenopathy.  Reproductive: Multiloculated fluid collection or collections centered about the right-side of the prostate and pelvic cul-de-sac. In conglomerate, measure on the order of 5.5 x 5.9 cm on image/series 94/2.  Other: No significant free fluid.  Musculoskeletal: Degenerative partial fusion of the bilateral sacroiliac joints.  IMPRESSION: 1. Peripherally enhancing fluid collection or collections centered about the right-side of the prostate and pelvic cul-de-sac. Suspicious for abscess/ abscesses. 2. Air within the urinary bladder and left renal collecting system/ureter. Suspicious for ascending infection. Edema in the left hemipelvis, along the course of the left ureter, is likely secondary. 3. Fluid-filled large and small bowel loops are suspicious for adynamic ileus. No focal transition point identified. 4. Bilateral nephrolithiasis. 5.  Atherosclerosis, including within the coronary arteries. 6. Mild motion degradation.  These results were called by telephone at the time of interpretation on 06/17/2015 at 5:35 pm to PA. SHARI UPSTILL , who verbally acknowledged these results.   Electronically Signed   By: Jeronimo Greaves M.D.   On: 06/17/2015 17:35   Dg Chest Port 1 View  06/17/2015   CLINICAL DATA:  Cough for 1 day.  EXAM: PORTABLE CHEST 1 VIEW  COMPARISON:  02/21/2015 and prior radiographs  FINDINGS: Cardiomegaly and mild pulmonary vascular  congestion noted.  An endotracheal tube is present with tip 7 cm above the carina.  There is no evidence of focal airspace disease, pulmonary edema, suspicious pulmonary nodule/mass, pleural effusion, or pneumothorax.  No acute bony abnormalities are identified.  IMPRESSION: Cardiomegaly with mild pulmonary vascular congestion.   Electronically Signed   By: Harmon Pier M.D.   On: 06/17/2015 19:32    EKG:  Independently reviewed. RBBB  Assessment/Plan  Sepsis due to prostate/periprostatic abscess IR in am for drianage Urology consulted:recs to treat with abx for now Started on Zosyn 3.375 Q6H (given Vanc in the ER) Ucx sent, Bcx sent: Not sent prior to abx in ER. Lactic acid 1.2 VS stable : admit to tele  Hypertherapeutic INR: Questionable hemoptysis vs hematemesis Continue to monitor H&H HR/BP/O2 sat : WNL Start on PPI IV BID and keep NPO  for now Stopped Coumadin on Thursday due to INR of 6.8 Will reverse INR with 1 mg vitamin K : plan for procedure in am and possible active bleeding.   Hypokalemia : mild.  Chronic DVT: S/p IVC filter. INR supratherapeutic, will reverse INR with 1 mg vit K orally  COPD; continue home meds  HTN: hold BP meds for now.   CHF: diastolic ,holding lasix and BP meds for today.      CXR with pulm vascular congestion : mild : will watch, avoid aggressive fluid for now as BP is stable  No evidence of PNA in CXR : got vanc in ER. Will stop Vanc for now and continue Zosyn.  Lines & Tubes: Tracheostomy DVT prophylaxis: SCDs. GI prophylaxis: PPI IV BID Consultants: IR & Urology Code Status: Full code   Eston Esters M.D [facility]

## 2015-06-17 NOTE — ED Notes (Signed)
Drinking oral contrast 

## 2015-06-17 NOTE — ED Notes (Signed)
Bed: [facility] Expected date:  Expected time:  Means of arrival:  Comments: EMS- nonambulatory abdominal pain

## 2015-06-17 NOTE — ED Notes (Signed)
Nurse drawing lactic acid

## 2015-06-17 NOTE — ED Notes (Addendum)
Pt has trach. Per ems pt is from home, pt has multiple complaints, LLQ abd pain, lower back pain, bil swelling to legs. Pt has not moved from sofa since Thursday 10/6.  Pt reports he was unable to move himself, normally ambulates with a walker.   Right shoulder surgery in April 2016, reports since the surgery pt has had ambulation difficulties. Pt reports "he died during the surgery", pt denies any cardiac condition. But now pt is on coumadin and does not know reason.

## 2015-06-17 NOTE — Progress Notes (Signed)
ANTIBIOTIC CONSULT NOTE - INITIAL  Pharmacy Consult for Zosyn Indication: Prostate abscess  No Known Allergies  Patient Measurements:   TBW: 139 kg  Vital Signs: Temp: 98.9 F (37.2 C) (10/10 1328) Temp Source: Oral (10/10 1328) BP: 122/64 mmHg (10/10 2000) Pulse Rate: 86 (10/10 2000) Intake/Output from previous day:   Intake/Output from this shift:    Labs:  Recent Labs  06/17/15 1406  WBC 12.6*  HGB 13.6  PLT 282  CREATININE 0.90   Estimated Creatinine Clearance: 123 mL/min (by C-G formula based on Cr of 0.9). No results for input(s): VANCOTROUGH, VANCOPEAK, VANCORANDOM, GENTTROUGH, GENTPEAK, GENTRANDOM, TOBRATROUGH, TOBRAPEAK, TOBRARND, AMIKACINPEAK, AMIKACINTROU, AMIKACIN in the last 72 hours.   Microbiology: No results found for this or any previous visit (from the past 720 hour(s)).  Medical History: Past Medical History  Diagnosis Date  . Arthritis   . RCT (rotator cuff tear)   . Multiple abrasions     rt arm  . Morbid obesity (HCC)   . Hypertension   . Hyperlipidemia   . COPD (chronic obstructive pulmonary disease) (HCC)   . CHF (congestive heart failure) (HCC)    Medications:  Scheduled:  . pantoprazole (PROTONIX) IV  40 mg Intravenous Q12H  . phytonadione  1 mg Oral Once   Anti-infectives    Start     Dose/Rate Route Frequency Ordered Stop   06/17/15 1815  vancomycin (VANCOCIN) IVPB 1000 mg/200 mL premix     1,000 mg 200 mL/hr over 60 Minutes Intravenous  Once 06/17/15 1811     06/17/15 1815  piperacillin-tazobactam (ZOSYN) IVPB 3.375 g     3.375 g 12.5 mL/hr over 240 Minutes Intravenous  Once 06/17/15 1811 06/17/15 1957     Assessment: 64 yoM with LLQ pain, noted abd abscess near prostate with air in bladder. Zosyn per pharmacy, first dose in ED. Vancomycin x1 in ED.  Goal of Therapy:  Appropriate antibiotic for treatment, renal function  Plan:   Zosyn 3.375gm q8hr - 4 hr infusion  Otho Bellows PharmD Pager  [phone] 06/17/2015, 8:39 PM

## 2015-06-17 NOTE — ED Notes (Signed)
EDPA SHARI MADE AWARE OF GASTRIC COLOR. GASTRIC OCCULT POSITIVE. EDPA SHARI AT BS TO EVALUATE THIS PT. Marland Kitchen PT ALERT AND ORIENT WITHOUT COMPLAINT

## 2015-06-17 NOTE — ED Notes (Signed)
Nurse drawing labs. 

## 2015-06-17 NOTE — ED Notes (Signed)
MD at bedside. UROLOGY MD PRESENT

## 2015-06-17 NOTE — Progress Notes (Signed)
RT called to ED to assess trach Pt. Pt states he does own trach care.  RT noted drain sponge was saturated with tan, green secretions with foul oder. RT cleaned trach & stoma. Once secretions were cleared, skin appeared dry & intact. Inner canula was clean, with Shelbie Hutching Valve attached. RT changed Inner Canula & replaced PMV.  Pt refused suctioning, stated  "it hurts & makes me cough too much".  RT placed Pt on .28 ATC. Pt comfortable at this time.

## 2015-06-18 DIAGNOSIS — I82402 Acute embolism and thrombosis of unspecified deep veins of left lower extremity: Secondary | ICD-10-CM

## 2015-06-18 DIAGNOSIS — J9611 Chronic respiratory failure with hypoxia: Secondary | ICD-10-CM

## 2015-06-18 DIAGNOSIS — K219 Gastro-esophageal reflux disease without esophagitis: Secondary | ICD-10-CM

## 2015-06-18 DIAGNOSIS — N412 Abscess of prostate: Secondary | ICD-10-CM | POA: Diagnosis present

## 2015-06-18 LAB — HEMOGLOBIN AND HEMATOCRIT, BLOOD
HCT: 39.1 % (ref 39.0–52.0)
HCT: 38.5 % — ABNORMAL LOW (ref 39.0–52.0)
HEMATOCRIT: 38 % — AB (ref 39.0–52.0)
HEMOGLOBIN: 12.2 g/dL — AB (ref 13.0–17.0)
Hemoglobin: 12.4 g/dL — ABNORMAL LOW (ref 13.0–17.0)
Hemoglobin: 12.2 g/dL — ABNORMAL LOW (ref 13.0–17.0)

## 2015-06-18 LAB — PROTIME-INR
INR: 2.29 — AB (ref 0.00–1.49)
PROTHROMBIN TIME: 25 s — AB (ref 11.6–15.2)

## 2015-06-18 MED ORDER — FAMOTIDINE 20 MG PO TABS
20.0000 mg | ORAL_TABLET | Freq: Every day | ORAL | Status: DC
  Administered 2015-06-18 – 2015-06-26 (×9): 20 mg via ORAL
  Filled 2015-06-18 (×9): qty 1

## 2015-06-18 MED ORDER — FUROSEMIDE 40 MG PO TABS
40.0000 mg | ORAL_TABLET | Freq: Every day | ORAL | Status: DC
  Administered 2015-06-18 – 2015-06-26 (×8): 40 mg via ORAL
  Filled 2015-06-18 (×9): qty 1

## 2015-06-18 MED ORDER — POTASSIUM CHLORIDE CRYS ER 20 MEQ PO TBCR
40.0000 meq | EXTENDED_RELEASE_TABLET | Freq: Every day | ORAL | Status: DC
  Administered 2015-06-18 – 2015-06-26 (×9): 40 meq via ORAL
  Filled 2015-06-18 (×10): qty 2

## 2015-06-18 MED ORDER — HYDRALAZINE HCL 25 MG PO TABS
25.0000 mg | ORAL_TABLET | Freq: Two times a day (BID) | ORAL | Status: DC
  Administered 2015-06-18 – 2015-06-26 (×16): 25 mg via ORAL
  Filled 2015-06-18 (×16): qty 1

## 2015-06-18 MED ORDER — MORPHINE SULFATE (PF) 2 MG/ML IV SOLN
2.0000 mg | INTRAVENOUS | Status: DC | PRN
  Administered 2015-06-18 – 2015-06-23 (×8): 2 mg via INTRAVENOUS
  Filled 2015-06-18 (×8): qty 1

## 2015-06-18 MED ORDER — VANCOMYCIN HCL IN DEXTROSE 1-5 GM/200ML-% IV SOLN
1000.0000 mg | Freq: Two times a day (BID) | INTRAVENOUS | Status: DC
  Administered 2015-06-18 – 2015-06-21 (×6): 1000 mg via INTRAVENOUS
  Filled 2015-06-18 (×6): qty 200

## 2015-06-18 MED ORDER — VANCOMYCIN HCL 10 G IV SOLR
1500.0000 mg | Freq: Once | INTRAVENOUS | Status: AC
  Administered 2015-06-18: 1500 mg via INTRAVENOUS
  Filled 2015-06-18 (×2): qty 1500

## 2015-06-18 MED ORDER — METOCLOPRAMIDE HCL 10 MG PO TABS
10.0000 mg | ORAL_TABLET | Freq: Three times a day (TID) | ORAL | Status: DC
  Administered 2015-06-18 – 2015-06-26 (×33): 10 mg via ORAL
  Filled 2015-06-18 (×32): qty 1

## 2015-06-18 MED ORDER — VITAMIN K1 10 MG/ML IJ SOLN
1.0000 mg | Freq: Once | INTRAVENOUS | Status: AC
  Administered 2015-06-18: 1 mg via INTRAVENOUS
  Filled 2015-06-18: qty 0.1

## 2015-06-18 MED ORDER — SERTRALINE HCL 100 MG PO TABS
100.0000 mg | ORAL_TABLET | Freq: Every day | ORAL | Status: DC
  Administered 2015-06-18 – 2015-06-26 (×9): 100 mg via ORAL
  Filled 2015-06-18 (×9): qty 1

## 2015-06-18 NOTE — Progress Notes (Signed)
Pt refusing to remove PMSV for sleep.  RT explained the importance of why we recommend this and he still is refusing.  RT to monitor and assess as needed.

## 2015-06-18 NOTE — Progress Notes (Signed)
RT called to Pt bedside for concerns of low Sp02 reading. RT found Pt on ATC with Sp02 = 97% and HR = 77. Pt resting comfortably.

## 2015-06-18 NOTE — Progress Notes (Signed)
RT spoke with portable and ordered trach supplies: #6 Shiley Proximal XLT cuffless #6 Shiley Proximal XLT cuffed (for emergency) Disposable inner cannulas #6 Shiley Proximal XLT.   Head of Bed Trach sheet sent to 4W.   Pt did not bring obturator. RT instructed RN to take out of new trach and tape to [facility]. Passy Muir warning sign may need to be order as well. Pt did not bring anything with him. All other supplies are at bedside at this time. RT will check this shortly to make sure it is all correct.

## 2015-06-18 NOTE — Progress Notes (Signed)
Patient got up to floor at 2100, suction equipment was set up and patient was using it for what he was coughing up. He was coughing up dark red thin liquid. Within an hour he had 200cc of dark red liquid in the suction canister. Patient's BP was 143/79, HR 82, 94% oxygen sat. On call NP was notified and new orders given to increase fluids, and H and H q6 hrs. Will continue to monitor.

## 2015-06-18 NOTE — Progress Notes (Signed)
ANTIBIOTIC CONSULT NOTE - INITIAL  Pharmacy Consult for Vancomycin Indication: prostate abscess  No Known Allergies  Patient Measurements: Height: 6' (182.9 cm) Weight: (!) 305 lb (138.347 kg) IBW/kg (Calculated) : 77.6   Vital Signs: Temp: 98.3 F (36.8 C) (10/11 0620) Temp Source: Oral (10/11 0620) BP: 139/73 mmHg (10/11 0620) Pulse Rate: 74 (10/11 0620) Intake/Output from previous day: 10/10 0701 - 10/11 0700 In: 640 [I.V.:590; IV Piggyback:50] Out: 650 [Urine:450] Intake/Output from this shift:    Labs:  Recent Labs  06/17/15 1406 06/17/15 2137 06/18/15 0025 06/18/15 0522  WBC 12.6* 12.6*  --   --   HGB 13.6 12.6* 12.4* 12.2*  PLT 282 298  --   --   CREATININE 0.90  --   --   --    Estimated Creatinine Clearance: 122.7 mL/min (by C-G formula based on Cr of 0.9). No results for input(s): VANCOTROUGH, VANCOPEAK, VANCORANDOM, GENTTROUGH, GENTPEAK, GENTRANDOM, TOBRATROUGH, TOBRAPEAK, TOBRARND, AMIKACINPEAK, AMIKACINTROU, AMIKACIN in the last 72 hours.   Microbiology: No results found for this or any previous visit (from the past 720 hour(s)).  Medical History: Past Medical History  Diagnosis Date  . Arthritis   . RCT (rotator cuff tear)   . Multiple abrasions     rt arm  . Morbid obesity (HCC)   . Hypertension   . Hyperlipidemia   . COPD (chronic obstructive pulmonary disease) (HCC)   . CHF (congestive heart failure) (HCC)      Assessment: 65 yoM with LLQ pain, noted abd abscess near prostate with air in bladder.  No history of UTIs or prostatitis or abdominal surgeries. No trauma.  Zosyn started in ED, now adding vancomycin per pharmacy.  Patient did received 1g of vancomycin in ED last night.  Urine and blood cultures sent (not sent prior to abx given in ED).  10/10 >> Zosyn >> 10/10 >> Vancomycin >>  Patient is afebrile, WBC elevated SCr 0.90, CrCl>100 ml/min (CG), ~86 ml/min (normalized)  10/10 blood cx x 2: sent 10/10 urine cx: sent  Goal  of Therapy:  Vancomycin trough level 15-20 mcg/ml  Doses adjusted per renal function Eradication of infection  Plan:  1.  Vancomycin 1500 mg x 1 now then 1g q12h. 2.  F/u SCr, culture results, vanc levels as indicated.  Clance Boll 06/18/2015,7:59 AM

## 2015-06-18 NOTE — Consult Note (Signed)
Chief Complaint: Patient was seen in consultation today for peri prostatic abscess Chief Complaint  Patient presents with  . Immobile since 10/4, Abd Pain, Back Pain    at the request of Dr Sherryl Barters  Referring Physician(s): Dr Sherryl Barters  History of Present Illness: [name] is a 64 y.o. 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 Peripherally enhancing fluid collection or collections centered about the right-side of the prostate and pelvic cul-de-sac. Suspicious for abscess/ abscesses. 2. Air within the urinary bladder and left renal collecting system/ureter. Suspicious for ascending infection. Edema in the left hemipelvis, along the course of the left ureter, is likely secondary. 3. Fluid-filled large and small bowel loops are suspicious for adynamic ileus. No focal transition point identified. 4. Bilateral nephrolithiasis. 5. Atherosclerosis, including within the coronary arteries. 6. Mild motion degradation.   Now request for prostate/peri prostatic abscess drain placement per Dr Sherryl Barters Dr Grace Isaac has reviewed imaging and discussed with Dr Sherryl Barters Scheduled now for peri prostatic abscess drain placement when INR is less than 1.5   Past Medical History  Diagnosis Date  . Arthritis   . RCT (rotator cuff tear)   . Multiple abrasions     rt arm  . Morbid obesity (HCC)   . Hypertension   . Hyperlipidemia   . COPD (chronic obstructive pulmonary disease) (HCC)   . CHF (congestive heart failure) [facility])     Past Surgical History  Procedure Laterality Date  . No past surgeries    . Shoulder arthroscopy with subacromial decompression, rotator cuff repair and bicep tendon repair Right 12/28/2014    Procedure: RIGHT SHOULDER ARTHROSCOPY WITH SUBACROMIAL DECOMPRESSION, DISTAL CLAVICLE RESECTION, ROTATOR  CUFF REPAIR ;  Surgeon: Eugenia Mcalpine, MD;  Location: [facility] ORS;  Service: Orthopedics;  Laterality: Right;  . Tracheostomy tube placement N/A 01/16/2015    Procedure: TRACHEOSTOMY;  Surgeon: Flo Shanks, MD;  Location: [facility] OR;  Service: ENT;  Laterality: N/A;  . Tracheostomy revision N/A 01/18/2015    Procedure: TRACHEOSTOMY REVISION;  Surgeon: Melvenia Beam, MD;  Location: [facility] OR;  Service: ENT;  Laterality: N/A;  . Tracheostomy tube placement N/A 01/22/2015    Procedure: TRACHEOSTOMY;  Surgeon: Flo Shanks, MD;  Location: [facility] OR;  Service: ENT;  Laterality: N/A;  . [id number]      Allergies: Review of patient's allergies indicates no known allergies.  Medications: Prior to Admission medications   Medication Sig Start Date End Date Taking? Authorizing Provider  furosemide (LASIX) 40 MG tablet Take 1 tablet (40 mg total) by mouth daily. 06/13/15  Yes Ruben Im, MD  ipratropium-albuterol (DUONEB) 0.5-2.5 (3) MG/3ML SOLN Take 3 mLs by nebulization every 6 (six) hours as needed.   Yes Historical Provider, MD  isosorbide dinitrate (ISORDIL) 20 MG tablet Take 20 mg by mouth 2 (two) times daily.   Yes Historical Provider, MD  lidocaine (LIDODERM) 5 % Place 1 patch onto the skin daily. Remove & Discard patch within 12 hours or as directed by MD To right shoulder   Yes Historical Provider, MD  potassium chloride SA (K-DUR,KLOR-CON) 20 MEQ tablet Take 2 tablets (40 mEq total) by mouth 4 (four) times daily. 04/22/15  Yes Sharee Holster, NP  sertraline (ZOLOFT) 100 MG tablet Take 100 mg by mouth daily.   Yes Historical Provider, MD  warfarin (COUMADIN) 10 MG tablet Take 10 mg by mouth daily. 05/31/15  Yes Historical Provider, MD  clonazePAM (KLONOPIN) 0.5 MG tablet Take 0.5 mg by mouth 3 (three) times daily.    Historical Provider, MD  famotidine (PEPCID) 20 MG tablet Take 20 mg by mouth daily.    Historical Provider, MD  folic acid (FOLVITE) 1 MG tablet Take 1 mg by mouth daily.    Historical Provider, MD    hydrALAZINE (APRESOLINE) 25 MG tablet Take 37.5 mg by mouth 2 (two) times daily.    Historical Provider, MD  metoCLOPramide (REGLAN) 10 MG tablet Take 10 mg by mouth 4 (four) times daily -  before meals and at bedtime.    Historical Provider, MD  metolazone (ZAROXOLYN) 5 MG tablet Take 5 mg by mouth daily.    Historical Provider, MD  Omega-3 Fatty Acids (FISH OIL) 1000 MG CAPS Take 1,000 mg by mouth daily.     Historical Provider, MD  ondansetron (ZOFRAN) 4 MG tablet Take 4 mg by mouth every 6 (six) hours.    Historical Provider, MD     Family History  Problem Relation Age of Onset  . Heart disease Mother   . Heart attack Mother   . Heart disease Brother     Social History   Social History  . Marital Status: Single    Spouse Name: N/A  . Number of Children: N/A  . Years of Education: N/A   Social History Main Topics  . Smoking status: [place] Every Day Smoker -- 0.20 packs/day    Types: Cigarettes  . Smokeless tobacco: None  . Alcohol Use: No  . Drug Use: No  . Sexual Activity: Not Asked   Other Topics Concern  . None   Social History Narrative    Review of Systems: A 12 point ROS discussed and pertinent positives are indicated in the HPI above.  All other systems are negative.  Review of Systems  Constitutional: Positive for activity change and fatigue.  Respiratory: Positive for shortness of breath. Negative for wheezing.   Gastrointestinal: Positive for nausea and abdominal pain.  Genitourinary: Positive for dysuria.  Neurological: Positive for weakness.  Psychiatric/Behavioral: Negative for behavioral problems and confusion.    Vital Signs: BP 139/73 mmHg  Pulse 74  Temp(Src) 98.3 F (36.8 C) (Oral)  Resp 20  Ht 6' (1.829 m)  Wt 305 lb (138.347 kg)  BMI 41.36 kg/m2  SpO2 98%  Physical Exam  Constitutional: He is oriented to person, place, and time.  obese  Cardiovascular: Normal rate and regular rhythm.   Pulmonary/Chest: He has wheezes.  +trach   Abdominal: Soft. Bowel sounds are normal. He exhibits distension. There is tenderness.  Musculoskeletal: Normal range of motion.  Neurological: He is alert and oriented to person, place, and time.  Skin: Skin is warm and dry.  Psychiatric: He has a normal mood and affect. His behavior is normal. Judgment and thought content normal.  Nursing note and vitals reviewed.   Mallampati Score:  MD Evaluation Airway: Other (comments) Airway comments: trach Heart: WNL Abdomen: WNL Chest/ Lungs: WNL ASA  Classification: 3 Mallampati/Airway Score: Three (trach)  Imaging: Ct Abdomen Pelvis W Contrast  06/17/2015   CLINICAL DATA:  Nausea and vomiting. Back pain and swelling in legs. Possible urine stool incontinence. Morbid obesity. COPD. Congestive heart failure.  EXAM: CT ABDOMEN AND PELVIS WITH CONTRAST  TECHNIQUE: Multidetector CT imaging of the abdomen and pelvis was performed using the standard protocol following bolus administration  of intravenous contrast.  CONTRAST:  OMNIPAQUE IOHEXOL 300 MG/ML  SOLN  COMPARISON:  01/28/2015  FINDINGS: Lower chest: Motion degradation throughout. Grossly clear lung bases. Mild cardiomegaly. Multivessel coronary artery atherosclerosis.  Hepatobiliary: Mild hepatic steatosis, without focal liver lesion. Normal gallbladder, without biliary ductal dilatation.  Pancreas: Normal, without mass or ductal dilatation. Periampullary duodenal diverticulum.  Spleen: Normal in size, without focal abnormality.  Adrenals/Urinary Tract: Normal adrenal glands. Bilateral renal collecting system calculi. Largest stone is in the interpolar left kidney at 11 mm.  Too small to characterize interpolar left renal lesion. A lower pole right renal lesion measures 13 mm and is likely a cyst.  Air within the left renal collecting system and left ureter proximally. There is edema along the course of the left ureter, including at the left pelvic brim on image/series 74/2. Air is identified  within the urinary bladder. No hydroureter or ureteric stone seen.  Stomach/Bowel: Mild gastric distension. Descending duodenal diverticulum. Normal terminal ileum and appendix. Small bowel loops are fluid-filled, measuring up to 3.7 cm. No transition point is identified. No pneumatosis or free intraperitoneal air. Colon is gas and fluid filled. Scattered colonic diverticula. No colonic wall thickening.  Vascular/Lymphatic: Aortic and branch vessel atherosclerosis. Small retroperitoneal nodes are not pathologic by size criteria. IVC filter is appropriately positioned. No pelvic adenopathy.  Reproductive: Multiloculated fluid collection or collections centered about the right-side of the prostate and pelvic cul-de-sac. In conglomerate, measure on the order of 5.5 x 5.9 cm on image/series 94/2.  Other: No significant free fluid.  Musculoskeletal: Degenerative partial fusion of the bilateral sacroiliac joints.  IMPRESSION: 1. Peripherally enhancing fluid collection or collections centered about the right-side of the prostate and pelvic cul-de-sac. Suspicious for abscess/ abscesses. 2. Air within the urinary bladder and left renal collecting system/ureter. Suspicious for ascending infection. Edema in the left hemipelvis, along the course of the left ureter, is likely secondary. 3. Fluid-filled large and small bowel loops are suspicious for adynamic ileus. No focal transition point identified. 4. Bilateral nephrolithiasis. 5.  Atherosclerosis, including within the coronary arteries. 6. Mild motion degradation.  These results were called by telephone at the time of interpretation on 06/17/2015 at 5:35 pm to PA. SHARI UPSTILL , who verbally acknowledged these results.   Electronically Signed   By: Jeronimo Greaves M.D.   On: 06/17/2015 17:35   Dg Chest Port 1 View  06/17/2015   CLINICAL DATA:  Cough for 1 day.  EXAM: PORTABLE CHEST 1 VIEW  COMPARISON:  02/21/2015 and prior radiographs  FINDINGS: Cardiomegaly and mild  pulmonary vascular congestion noted.  An endotracheal tube is present with tip 7 cm above the carina.  There is no evidence of focal airspace disease, pulmonary edema, suspicious pulmonary nodule/mass, pleural effusion, or pneumothorax.  No acute bony abnormalities are identified.  IMPRESSION: Cardiomegaly with mild pulmonary vascular congestion.   Electronically Signed   By: Harmon Pier M.D.   On: 06/17/2015 19:32    Labs:  CBC:  Recent Labs  03/14/15 0552 03/25/15 0605 06/17/15 1406 06/17/15 2137 06/18/15 0025 06/18/15 0522  WBC 8.5 8.5 12.6* 12.6*  --   --   HGB 12.1* 11.7* 13.6 12.6* 12.4* 12.2*  HCT 36.3* 36.4* 41.0 38.4* 39.1 38.5*  PLT 251 240 282 298  --   --     COAGS:  Recent Labs  01/09/15 1510 01/28/15 2009  05/07/15 1315 06/13/15 1551 06/17/15 1609 06/18/15 0721  INR 1.07 1.22  < > 1.88* 6.8 3.15*  2.29*  APTT 25 55*  --   --   --   --   --   < > = values in this interval not displayed.  BMP:  Recent Labs  03/19/15 0455 03/20/15 0349 03/25/15 0605 06/13/15 1553 06/17/15 1406  NA 136  --  138 135 137  K 3.2* 3.8 3.4* 3.7 3.3*  CL 92*  --  95* 96* 101  CO2 33*  --  GLUCOSE 110*  --  111* 127* 137*  BUN 23*  --  20 28* 24*  CALCIUM 9.7  --  9.7 9.0 9.6  CREATININE 0.98  --  1.01 1.14 0.90  GFRNONAA >60  --  >60 69 >60  GFRAA >60  --  >60 79 >60    LIVER FUNCTION TESTS:  Recent Labs  01/02/15 0430  01/24/15 0342  01/30/15 0655 03/04/15 0555 03/07/15 0553 03/14/15 0552 06/17/15 1609  BILITOT 1.9*  --  1.4*  --  0.8  --   --   --  1.1  AST 45*  --  57*  --  45*  --   --   --  25  ALT 29  --  36  --  49  --   --   --  29  ALKPHOS 80  --  91  --  113  --   --   --  173*  PROT 7.2  --  6.1*  --  6.8  --   --   --  7.9  ALBUMIN 3.3*  < > 2.5*  < > 2.8* 3.4* 3.2* 3.2* 3.1*  < > = values in this interval not displayed.  TUMOR MARKERS:  Recent Labs  01/10/15 0930  AFPTM 2.7    Assessment and Plan:  Abd pain Dysuria CT  reveals prostatic and peri prostatic abscess Scheduled for peri prostatic abscess drain placement in Rad asap INR 2.29 today---await for INR to be 1.5 or lower Will keep npo for 10/12---may have to reschedule to later date dependent on INR  Risks and Benefits discussed with the patient including bleeding, infection, damage to adjacent structures, bowel perforation/fistula connection, and sepsis. All of the patient's questions were answered, patient is agreeable to proceed. Consent signed and in chart.  Thank you for this interesting consult.  I greatly enjoyed meeting Grantham Overacker and look forward to participating in their care.  A copy of this report was sent to the requesting provider on this date.  Signed: Alejandra Hunt A 06/18/2015, 11:20 AM   I spent a total of 40 Minutes    in face to face in clinical consultation, greater than 50% of which was counseling/coordinating care for peri prostatic abscess drain

## 2015-06-18 NOTE — Progress Notes (Signed)
Initial Nutrition Assessment  DOCUMENTATION CODES:   Morbid obesity  INTERVENTION:  - Diet advancement as medically feasible - RD will continue to monitor for needs  NUTRITION DIAGNOSIS:   Inadequate oral intake related to inability to eat as evidenced by NPO status.  GOAL:   Patient will meet greater than or equal to 90% of their needs  MONITOR:   Diet advancement, Weight trends, Labs, Skin, I & O's  REASON FOR ASSESSMENT:   Malnutrition Screening Tool  ASSESSMENT:   Patient with history of chronic DVT s/p IVC filter on coumadin, COPD, diastolic HF,HTN with recent admission with respiratory failure s/p trach who presented with LLQ pain : started 4 days ago, getting worse, no fever or chills, with diarrhea, no vomiting or nausea, with brownish urine and dysuria. He also has had increased brownish secretions from his trach but no dyspnea or chest pain.LL edema has been worsening.INR has been supra-therapeutic: he was told to hold Coumadin last Thursday as INR was 6.8 in clinic.   Pt seen for MST. BMI indicates morbid obesity. Pt has been NPO since admission and unable to meet needs. He has trach in place. Pt reports he has not had anything to eat x3 days due to general feelings of well including nausea and abdominal pain. Pt denies any chewing or swallowing difficulties or any foods he needs to avoid due to trach site. He states that prior to the past 3 days he had a good appetite and was eating well.  No muscle or fat wasting noted. Pt believes that he has lost weight recently but is unsure of UBW or amount of weight he may have lost. Per chart review, pt lost 55 lbs (15% body weight) in the past 7 months which is significant for time frame. Unable to confirm malnutrition at this time; will monitor weight trends and edema as well as intakes once diet advanced.  Medications reviewed; Reglan order in place. Labs reviewed; K: 3.3 mmol/L, BUN elevated.   Diet Order:  Diet NPO time  specified  Skin:  Reviewed, no issues  Last BM:  10/11  Height:   Ht Readings from Last 1 Encounters:  06/17/15 6' (1.829 m)    Weight:   Wt Readings from Last 1 Encounters:  06/17/15 305 lb (138.347 kg)    Ideal Body Weight:  80.91 kg (kg)  BMI:  Body mass index is 41.36 kg/(m^2).  Estimated Nutritional Needs:   Kcal:  2075-2275  Protein:  85-95 grams  Fluid:  1.6-1.9 L/day  EDUCATION NEEDS:   No education needs identified at this time      Trenton Gammon, RD, LDN Inpatient Clinical Dietitian Pager # [phone] After hours/weekend pager # [phone]

## 2015-06-18 NOTE — Clinical Documentation Improvement (Signed)
Internal Medicine  Noted diagnosis of "Sepsis" documented on H + P but not carried to progress notes or Hosp problem list , was dx ruled in or ruled out.?  Thank you    Sepsis ruled in   Sepsis ruled out   Other  Clinically Undetermined  Document any associated diagnoses/conditions.   Supporting Information: Lactic acid 1.2/ WBC 12.6./ Abscess to prostate / vs WNL    Please exercise your independent, professional judgment when responding. A specific answer is not anticipated or expected.   Thank You,  Lavonda Jumbo [facility] [employer] [facility] [place] [phone]

## 2015-06-18 NOTE — Progress Notes (Signed)
Pt sleeping comfortably  Filed Vitals:   06/18/15 0620 06/18/15 0804 06/18/15 0910 06/18/15 1443  BP: 139/73     Pulse: 74   75  Temp: 98.3 F (36.8 C)     TempSrc: Oral     Resp: 20   20  Height:      Weight:      SpO2: 95% 97% 98% 100%   I/O last 3 completed shifts: In: 640 [I.V.:590; IV Piggyback:50] Out: 650 [Urine:450; Other:200]   NAD Soft NT ND No foley  CBC Latest Ref Rng 06/18/2015 06/18/2015 06/18/2015  WBC 4.0 - 10.5 K/uL - - -  Hemoglobin 13.0 - 17.0 g/dL 12.2(L) 12.2(L) 12.4(L)  Hematocrit 39.0 - 52.0 % 38.0(L) 38.5(L) 39.1  Platelets 150 - 400 K/uL - - -    BMP Latest Ref Rng 06/17/2015 06/13/2015 03/25/2015  Glucose 65 - 99 mg/dL 161(W) 960(A) 540(J)  BUN 6 - 20 mg/dL 81(X) 91(Y) 20  Creatinine 0.61 - 1.24 mg/dL 7.82 9.56 2.13  BUN/Creat Ratio 10 - 22 - 25(H) -  Sodium 135 - 145 mmol/L 137 135 138  Potassium 3.5 - 5.1 mmol/L 3.3(L) 3.7 3.4(L)  Chloride 101 - 111 mmol/L 101 96(L) 95(L)  CO2 22 - 32 mmol/L Calcium 8.9 - 10.3 mg/dL 9.6 9.0 9.7     1. Prostatic abscess -IR plans to drain abscess prostate abscess when INR < 1.5 as patient is poor surgical candidate -Recommend broad spectrum antibiotic coverage - f/u urine culture -Monitor vitals, WBC -Monitor Cr, UOP -continue INR reversal -avoid unnecessary instrumentation of urinary tract (including foley)  2. Air in the left collecting system and bladder -see above  3. Nonobstructing nephrolithiasis -No further workup this time. This can be addressed in the nonacute setting.

## 2015-06-18 NOTE — Progress Notes (Signed)
[facility] PROGRESS NOTE  Cordie Mccown ONG:[id number] DOB: 10-31-1950 DOA: 06/17/2015 PCP: Ruben Im, MD  Assessment/Plan: 1-prostate abscess/UTI: patient is non-toxic, normal lactic acid and was not found tachycardic, with fever or tachypneic. High risk to developed sepsis  -will continue broad spectrum antibiotics -will follow urology rec's -continue supportive care  2-chronic DVT: s/p IVC filter -patient on coumadin -will reversed for procedure -INR 2.2 this am -will give another 1 mg of vit K -will follow INR in am  3-questionable hemoptysis vs hematemesis: none since admission -Hgb has remained stable -will monitor trend -continue plan to reverse coumadin level  4-hypokalemia: will replete  5-morbid obesity  -Body mass index is 41.36 kg/(m^2). -advise to follow low calorie diet and to increase exercise activity   6-chronic resp failure: trach dependent -continue oxygen supplementation -not in distress and stable -will ask RT to continue    7-essential HTN: stable  -will monitor and continue home antihypertensive regimen  8-chronic diastolic heart failure:  -will check daily weights -Strict I's and O's -will continue lasix -follow volume status   Code Status: Full Family Communication: no family at bedside  Disposition Plan: remains inpatient, will continue IV antibiotics, plan is for percutaneous drainage of his prostate abscess   Consultants:  Urology (Dr. Sherryl Barters)  IR  Procedures:  Planned is for percutaneous drain placement for prostate abscess on 10/12 or later this week base on INR fluctuation.  Antibiotics:  vanc and zosyn 10/11  HPI/Subjective: Afebrile, no nausea or vomiting, reporting some suprapubic tenderness. Patient denies CP and reports breathing is at baseline    Objective: Filed Vitals:   06/18/15 1443  BP:   Pulse: 75  Temp:   Resp: 20    Intake/Output Summary (Last 24 hours) at 06/18/15 1732 Last data filed at  06/18/15 1604  Gross per 24 hour  Intake    640 ml  Output    750 ml  Net   -110 ml   Filed Weights   06/17/15 2054  Weight: 138.347 kg (305 lb)    Exam:   General:  Afebrile, no CP, no SOB. Patient is AAOX3; reports some suprapubic pain   Cardiovascular: S1 and S2, no rubs or gallops  Respiratory: trach in place, diffuse rhonchi, no rales  Abdomen: suprapubic tenderness, no guarding, positive BS.  Musculoskeletal: trace edema, no cyanosis   Data Reviewed: Basic Metabolic Panel:  Recent Labs Lab 06/13/15 1553 06/17/15 1406  NA 135 137  K 3.7 3.3*  CL 96* 101  CO2 20 26  GLUCOSE 127* 137*  BUN 28* 24*  CREATININE 1.14 0.90  CALCIUM 9.0 9.6   Liver Function Tests:  Recent Labs Lab 06/17/15 1609  AST 25  ALT 29  ALKPHOS 173*  BILITOT 1.1  PROT 7.9  ALBUMIN 3.1*   CBC:  Recent Labs Lab 06/17/15 1406 06/17/15 2137 06/18/15 0025 06/18/15 0522 06/18/15 1300  WBC 12.6* 12.6*  --   --   --   NEUTROABS 11.1* 10.5*  --   --   --   HGB 13.6 12.6* 12.4* 12.2* 12.2*  HCT 41.0 38.4* 39.1 38.5* 38.0*  MCV 86.1 87.5  --   --   --   PLT 282 298  --   --   --    Cardiac Enzymes:  Recent Labs Lab 06/17/15 1609  CKTOTAL 79   BNP (last 3 results)  Recent Labs  12/28/14 2150 01/02/15 1130 02/17/15 0608  BNP 55.7 55.9 21.9   Studies: Ct Abdomen  Pelvis W Contrast  06/17/2015   CLINICAL DATA:  Nausea and vomiting. Back pain and swelling in legs. Possible urine stool incontinence. Morbid obesity. COPD. Congestive heart failure.  EXAM: CT ABDOMEN AND PELVIS WITH CONTRAST  TECHNIQUE: Multidetector CT imaging of the abdomen and pelvis was performed using the standard protocol following bolus administration of intravenous contrast.  CONTRAST:  OMNIPAQUE IOHEXOL 300 MG/ML  SOLN  COMPARISON:  01/28/2015  FINDINGS: Lower chest: Motion degradation throughout. Grossly clear lung bases. Mild cardiomegaly. Multivessel coronary artery atherosclerosis.   Hepatobiliary: Mild hepatic steatosis, without focal liver lesion. Normal gallbladder, without biliary ductal dilatation.  Pancreas: Normal, without mass or ductal dilatation. Periampullary duodenal diverticulum.  Spleen: Normal in size, without focal abnormality.  Adrenals/Urinary Tract: Normal adrenal glands. Bilateral renal collecting system calculi. Largest stone is in the interpolar left kidney at 11 mm.  Too small to characterize interpolar left renal lesion. A lower pole right renal lesion measures 13 mm and is likely a cyst.  Air within the left renal collecting system and left ureter proximally. There is edema along the course of the left ureter, including at the left pelvic brim on image/series 74/2. Air is identified within the urinary bladder. No hydroureter or ureteric stone seen.  Stomach/Bowel: Mild gastric distension. Descending duodenal diverticulum. Normal terminal ileum and appendix. Small bowel loops are fluid-filled, measuring up to 3.7 cm. No transition point is identified. No pneumatosis or free intraperitoneal air. Colon is gas and fluid filled. Scattered colonic diverticula. No colonic wall thickening.  Vascular/Lymphatic: Aortic and branch vessel atherosclerosis. Small retroperitoneal nodes are not pathologic by size criteria. IVC filter is appropriately positioned. No pelvic adenopathy.  Reproductive: Multiloculated fluid collection or collections centered about the right-side of the prostate and pelvic cul-de-sac. In conglomerate, measure on the order of 5.5 x 5.9 cm on image/series 94/2.  Other: No significant free fluid.  Musculoskeletal: Degenerative partial fusion of the bilateral sacroiliac joints.  IMPRESSION: 1. Peripherally enhancing fluid collection or collections centered about the right-side of the prostate and pelvic cul-de-sac. Suspicious for abscess/ abscesses. 2. Air within the urinary bladder and left renal collecting system/ureter. Suspicious for ascending infection.  Edema in the left hemipelvis, along the course of the left ureter, is likely secondary. 3. Fluid-filled large and small bowel loops are suspicious for adynamic ileus. No focal transition point identified. 4. Bilateral nephrolithiasis. 5.  Atherosclerosis, including within the coronary arteries. 6. Mild motion degradation.  These results were called by telephone at the time of interpretation on 06/17/2015 at 5:35 pm to PA. SHARI UPSTILL , who verbally acknowledged these results.   Electronically Signed   By: Jeronimo Greaves M.D.   On: 06/17/2015 17:35   Dg Chest Port 1 View  06/17/2015   CLINICAL DATA:  Cough for 1 day.  EXAM: PORTABLE CHEST 1 VIEW  COMPARISON:  02/21/2015 and prior radiographs  FINDINGS: Cardiomegaly and mild pulmonary vascular congestion noted.  An endotracheal tube is present with tip 7 cm above the carina.  There is no evidence of focal airspace disease, pulmonary edema, suspicious pulmonary nodule/mass, pleural effusion, or pneumothorax.  No acute bony abnormalities are identified.  IMPRESSION: Cardiomegaly with mild pulmonary vascular congestion.   Electronically Signed   By: Harmon Pier M.D.   On: 06/17/2015 19:32    Scheduled Meds: . famotidine  20 mg Oral Daily  . furosemide  40 mg Oral Daily  . metoCLOPramide  10 mg Oral TID AC & HS  . pantoprazole (PROTONIX) IV  40 mg Intravenous Q12H  . piperacillin-tazobactam (ZOSYN)  IV  3.375 g Intravenous Q8H  . potassium chloride SA  40 mEq Oral Daily  . sertraline  100 mg Oral Daily  . vancomycin  1,000 mg Intravenous Q12H   Continuous Infusions: . sodium chloride 100 mL/hr at 06/18/15 1712    Active Problems:   Prostate abscess   Abscess   Abscess, prostate   Time spent: 30 minutes   Vassie Loll  [facility] Pager [phone]. If 7PM-7AM, please contact night-coverage at [email or web address], password [facility] 06/18/2015, 5:32 PM  LOS: 1 day

## 2015-06-19 ENCOUNTER — Inpatient Hospital Stay (HOSPITAL_COMMUNITY): Payer: No Typology Code available for payment source

## 2015-06-19 DIAGNOSIS — Z930 Tracheostomy status: Secondary | ICD-10-CM

## 2015-06-19 DIAGNOSIS — R7881 Bacteremia: Secondary | ICD-10-CM | POA: Diagnosis present

## 2015-06-19 DIAGNOSIS — I5032 Chronic diastolic (congestive) heart failure: Secondary | ICD-10-CM

## 2015-06-19 DIAGNOSIS — Z6841 Body Mass Index (BMI) 40.0 and over, adult: Secondary | ICD-10-CM

## 2015-06-19 DIAGNOSIS — I10 Essential (primary) hypertension: Secondary | ICD-10-CM

## 2015-06-19 DIAGNOSIS — E876 Hypokalemia: Secondary | ICD-10-CM

## 2015-06-19 DIAGNOSIS — A4150 Gram-negative sepsis, unspecified: Secondary | ICD-10-CM | POA: Diagnosis present

## 2015-06-19 DIAGNOSIS — Z720 Tobacco use: Secondary | ICD-10-CM

## 2015-06-19 DIAGNOSIS — E662 Morbid (severe) obesity with alveolar hypoventilation: Secondary | ICD-10-CM

## 2015-06-19 LAB — PROTIME-INR
INR: 1.3 (ref 0.00–1.49)
PROTHROMBIN TIME: 16.3 s — AB (ref 11.6–15.2)

## 2015-06-19 LAB — BASIC METABOLIC PANEL
Anion gap: 10 (ref 5–15)
BUN: 19 mg/dL (ref 6–20)
CALCIUM: 9.3 mg/dL (ref 8.9–10.3)
CHLORIDE: 104 mmol/L (ref 101–111)
CO2: 24 mmol/L (ref 22–32)
CREATININE: 0.76 mg/dL (ref 0.61–1.24)
Glucose, Bld: 118 mg/dL — ABNORMAL HIGH (ref 65–99)
Potassium: 3 mmol/L — ABNORMAL LOW (ref 3.5–5.1)
SODIUM: 138 mmol/L (ref 135–145)

## 2015-06-19 LAB — CBC
HCT: 37.6 % — ABNORMAL LOW (ref 39.0–52.0)
Hemoglobin: 11.7 g/dL — ABNORMAL LOW (ref 13.0–17.0)
MCH: 27.5 pg (ref 26.0–34.0)
MCHC: 31.1 g/dL (ref 30.0–36.0)
MCV: 88.3 fL (ref 78.0–100.0)
Platelets: 325 10*3/uL (ref 150–400)
RBC: 4.26 MIL/uL (ref 4.22–5.81)
RDW: 16.1 % — AB (ref 11.5–15.5)
WBC: 8.7 10*3/uL (ref 4.0–10.5)

## 2015-06-19 LAB — GLUCOSE, CAPILLARY: GLUCOSE-CAPILLARY: 143 mg/dL — AB (ref 65–99)

## 2015-06-19 LAB — HEMOGLOBIN A1C
Hgb A1c MFr Bld: 6.4 % — ABNORMAL HIGH (ref 4.8–5.6)
Mean Plasma Glucose: 137 mg/dL

## 2015-06-19 MED ORDER — FENTANYL CITRATE (PF) 100 MCG/2ML IJ SOLN
INTRAMUSCULAR | Status: AC
  Filled 2015-06-19: qty 4

## 2015-06-19 MED ORDER — ISOSORBIDE DINITRATE 20 MG PO TABS
20.0000 mg | ORAL_TABLET | Freq: Two times a day (BID) | ORAL | Status: DC
  Administered 2015-06-19 – 2015-06-26 (×15): 20 mg via ORAL
  Filled 2015-06-19 (×17): qty 1

## 2015-06-19 MED ORDER — MIDAZOLAM HCL 2 MG/2ML IJ SOLN
INTRAMUSCULAR | Status: AC | PRN
  Administered 2015-06-19: 1 mg via INTRAVENOUS

## 2015-06-19 MED ORDER — METOLAZONE 5 MG PO TABS
5.0000 mg | ORAL_TABLET | Freq: Every day | ORAL | Status: DC
  Administered 2015-06-19 – 2015-06-26 (×8): 5 mg via ORAL
  Filled 2015-06-19 (×9): qty 1

## 2015-06-19 MED ORDER — MIDAZOLAM HCL 2 MG/2ML IJ SOLN
INTRAMUSCULAR | Status: AC
  Filled 2015-06-19: qty 6

## 2015-06-19 MED ORDER — FENTANYL CITRATE (PF) 100 MCG/2ML IJ SOLN
INTRAMUSCULAR | Status: AC | PRN
  Administered 2015-06-19: 50 ug via INTRAVENOUS

## 2015-06-19 MED ORDER — CLONAZEPAM 0.5 MG PO TABS
0.5000 mg | ORAL_TABLET | Freq: Three times a day (TID) | ORAL | Status: DC | PRN
Start: 2015-06-19 — End: 2015-06-26
  Administered 2015-06-19 – 2015-06-22 (×3): 0.5 mg via ORAL
  Filled 2015-06-19 (×3): qty 1

## 2015-06-19 MED ORDER — POTASSIUM CHLORIDE CRYS ER 20 MEQ PO TBCR
40.0000 meq | EXTENDED_RELEASE_TABLET | Freq: Once | ORAL | Status: AC
  Administered 2015-06-19: 40 meq via ORAL
  Filled 2015-06-19: qty 2

## 2015-06-19 NOTE — Procedures (Signed)
CT guided drainage of the prostate/peri-prostate abscess.  30 ml of purulent fluid was removed and sent for culture.  No immediate complication.  Minimal blood loss.

## 2015-06-19 NOTE — Progress Notes (Signed)
No GU events overnight  Filed Vitals:   06/18/15 2334 06/19/15 0346 06/19/15 0642 06/19/15 0756  BP:   152/77   Pulse: 78 76 73 71  Temp:   98.4 F (36.9 C)   TempSrc:   Oral   Resp: 18 18 18 18   Height:      Weight:   309 lb (140.161 kg)   SpO2: 95% 94% 94%    I/O last 3 completed shifts: In: 1903.3 [I.V.:1753.3; IV Piggyback:150] Out: 1250 [Urine:1050; Other:200] Total I/O In: 250 [IV Piggyback:250] Out: 390 [Urine:390]  NAD Soft NT ND No foley  CBC Latest Ref Rng 06/19/2015 06/18/2015 06/18/2015  WBC 4.0 - 10.5 K/uL 8.7 - -  Hemoglobin 13.0 - 17.0 g/dL 11.7(L) 12.2(L) 12.2(L)  Hematocrit 39.0 - 52.0 % 37.6(L) 38.0(L) 38.5(L)  Platelets 150 - 400 K/uL 325 - -    BMP Latest Ref Rng 06/19/2015 06/17/2015 06/13/2015  Glucose 65 - 99 mg/dL 478(G118(H) 956(O137(H) 130(Q127(H)  BUN 6 - 20 mg/dL 19 65(H24(H) 84(O28(H)  Creatinine 0.61 - 1.24 mg/dL 9.620.76 9.520.90 8.411.14  BUN/Creat Ratio 10 - 22 - - 25(H)  Sodium 135 - 145 mmol/L 138 137 135  Potassium 3.5 - 5.1 mmol/L 3.0(L) 3.3(L) 3.7  Chloride 101 - 111 mmol/L 104 101 96(L)  CO2 22 - 32 mmol/L 24 26 20   Calcium 8.9 - 10.3 mg/dL 9.3 9.6 9.0    Urine Culture: E. Coli Blood culture: GNR  1. Prostatic abscess/sepsis -IRto drain abscess prostate abscess -Recommend broad spectrum antibiotic coverage pending c/s -Monitor vitals, WBC -Monitor Cr, UOP -avoid unnecessary instrumentation of urinary tract (including foley)  2. Air in the left collecting system and bladder -see above  3. Nonobstructing nephrolithiasis -No further workup this time. This can be addressed in the nonacute setting.

## 2015-06-19 NOTE — Progress Notes (Signed)
Patient ID: [name], male   DOB: Aug 24, 1951, 64 y.o.   MRN: [id number]  [facility] PROGRESS NOTE  [name] HEN:[id number] DOB: [birth year] DOA: 06/17/2015 PCP: Liberty Handy, MD   Brief narrative:     Patient with history of chronic DVT s/p IVC filter on coumadin, COPD, diastolic HF,HTN with recent admission with respiratory failure s/p trach who presented with LLQ pain : started 4 days ago, getting worse, no fever or chills, with diarrhea, no vomiting or nausea, with brownish urine and dysuria. He also has had increased brownish secretions from his trach but no dyspnea or chest pain.LL edema has been worsening.INR has been supra-therapeutic: he was told to hold Coumadin last Thursday as INR was 6.8 in clinic. No other complaints. No history of UTIs or prostatitis or abdominal surgeries. No trauma  Assessment/Plan:    Principal Problem:   Abscess, prostate - per urologist, IR plans to drain abscess prostate abscess when INR < 1.5 as patient is poor surgical candidate - continue broad spectrum ABX vancomycin and zosyn day #2 - f/u urine culture, preliminary report positive for E. Coli, final report pending  - Monitor Cr, UOP - INR is 1.30 this AM, no need for reversal this AM  Active Problems:   Sepsis due to Gram-negative bacteremia ([place]) - please note that pt met criteria for sepsis on admission with HR > 80, WBC > 12.3, ? source UTI, prostate abscess  - preliminary report with g-rods and since urine culture with E. Coli this is the likely source - continue [place] ABX regimen, no plan to change until final cultures report back     Chronic deep vein thrombosis (DVT) (HCC) - on Coumadin at home but has been on hold for now until abscess drain done - INR is 1.3 this AM    Obesity hypoventilation syndrome (HCC) - trach collar in place    Hypokalemia - supplement and repeat BMP in AM    BMI 50.0-59.9, adult (HCC) - Body mass index is 41.9 kg/(m^2)    Tobacco  abuse - cessation consultation provided     Tracheostomy status ([place]) - monitor oxygen saturation     Essential hypertension, benign - reasonable inpatient control     Chronic diastolic congestive heart failure (HCC) - weight 140 kg this AM, was 138 kg on admission - stop IVF and continue home regimen with Lasix   DVT prophylaxis - SCD's  Code Status: Full.  Family Communication:  plan of care discussed with the patient Disposition Plan: Home when stable  IV access:  Peripheral IV  Procedures and diagnostic studies:    Ct Abdomen Pelvis W Contrast 06/17/2015  Peripherally enhancing fluid collection or collections centered about the right-side of the prostate and pelvic cul-de-sac. Suspicious for abscess/ abscesses. 2. Air within the urinary bladder and left renal collecting system/ureter. Suspicious for ascending infection. Edema in the left hemipelvis, along the course of the left ureter, is likely secondary. 3. Fluid-filled large and small bowel loops are suspicious for adynamic ileus. No focal transition point identified. 4. Bilateral nephrolithiasis. 5.  Atherosclerosis, including within the coronary arteries. 6. Mild motion degradation.   Dg Chest Port 1 View 06/17/2015  Cardiomegaly with mild pulmonary vascular congestion.   Medical Consultants:  Urology   Other Consultants:  None  IAnti-Infectives:   Vancomycin 10/11 --> Zosyn 10/11 -->   Faye Ramsay, MD  [facility] Pager [phone]   If 7PM-7AM, please contact night-coverage [email or web address] Password [email or web address] 06/19/2015, 11:04 AM   LOS: 2  days   HPI/Subjective: No events overnight.   Objective: Filed Vitals:   06/18/15 2334 06/19/15 0346 06/19/15 0642 06/19/15 0756  BP:   152/77   Pulse: 78 76 73 71  Temp:   98.4 F (36.9 C)   TempSrc:   Oral   Resp: _0 Height:      Weight:   140.161 kg (309 lb)   SpO2: 95% 94% 94%     Intake/Output Summary (Last 24 hours) at 06/19/15 1104 Last data filed  at 06/19/15 1104  Gross per 24 hour  Intake 1463.33 ml  Output    875 ml  Net 588.33 ml    Exam:   General:  Pt is alert, follows commands appropriately, not in acute distress  Cardiovascular: Regular rate and rhythm, S1/S2, no murmurs, no rubs, no gallops  Respiratory: Clear to auscultation bilaterally, diminished breath sounds at bases   Abdomen: Soft, non tender, slightly distended, bowel sounds present, no guarding  Data Reviewed: Basic Metabolic Panel:  Recent Labs Lab 06/13/15 1553 06/17/15 1406 06/19/15 0422  NA 135 137 138  K 3.7 3.3* 3.0*  CL 96* 101 104  CO2 _1 GLUCOSE 127* 137* 118*  BUN 28* 24* 19  CREATININE 1.14 0.90 0.76  CALCIUM 9.0 9.6 9.3   Liver Function Tests:  Recent Labs Lab 06/17/15 1609  AST 25  ALT 29  ALKPHOS 173*  BILITOT 1.1  PROT 7.9  ALBUMIN 3.1*   CBC:  Recent Labs Lab 06/17/15 1406 06/17/15 2137 06/18/15 0025 06/18/15 0522 06/18/15 1300 06/19/15 0422  WBC 12.6* 12.6*  --   --   --  8.7  NEUTROABS 11.1* 10.5*  --   --   --   --   HGB 13.6 12.6* 12.4* 12.2* 12.2* 11.7*  HCT 41.0 38.4* 39.1 38.5* 38.0* 37.6*  MCV 86.1 87.5  --   --   --  88.3  PLT 282 298  --   --   --  325   Cardiac Enzymes:  Recent Labs Lab 06/17/15 1609  CKTOTAL 79   Recent Results (from the past 240 hour(s))  Culture, Urine     Status: None (Preliminary result)   Collection Time: 06/17/15  9:04 PM  Result Value Ref Range Status   Specimen Description URINE, CLEAN CATCH  Final   Culture   Final    >=100,000 COLONIES/mL ESCHERICHIA COLI    Report Status PENDING  Incomplete  Culture, blood (routine x 2)     Status: None (Preliminary result)   Collection Time: 06/17/15  9:37 PM  Result Value Ref Range Status   Specimen Description BLOOD BLOOD LEFT FOREARM  Final   Culture  Setup Time   Final    GRAM NEGATIVE RODS    Report Status PENDING  Incomplete    Scheduled Meds: . famotidine  20 mg Oral Daily  . furosemide  40 mg Oral  Daily  . hydrALAZINE  25 mg Oral BID  . metoCLOPramide  10 mg Oral TID AC & HS  . pantoprazole  IV  40 mg Intravenous Q12H  . ZOSYN  IV  3.375 g Intravenous Q8H  . potassium chloride SA  40 mEq Oral Daily  . sertraline  100 mg Oral Daily  . vancomycin  1,000 mg Intravenous Q12H   Continuous Infusions: . sodium chloride 100 mL/hr at 06/18/15 1712

## 2015-06-19 NOTE — Progress Notes (Signed)
Positive bld cultures, gm negative rods in aerobic bottle. NP notified. SRP, RN

## 2015-06-19 NOTE — Progress Notes (Signed)
Pt is home alone PT to eval. Will continue to follow pt for disposition and discharge needs.

## 2015-06-19 NOTE — Progress Notes (Signed)
RT replaced trach collar set up- PT refusing to utilize at this time.

## 2015-06-20 DIAGNOSIS — I82503 Chronic embolism and thrombosis of unspecified deep veins of lower extremity, bilateral: Secondary | ICD-10-CM

## 2015-06-20 LAB — URINE CULTURE

## 2015-06-20 LAB — BASIC METABOLIC PANEL
Anion gap: 11 (ref 5–15)
BUN: 16 mg/dL (ref 6–20)
CALCIUM: 9.3 mg/dL (ref 8.9–10.3)
CO2: 25 mmol/L (ref 22–32)
CREATININE: 0.74 mg/dL (ref 0.61–1.24)
Chloride: 102 mmol/L (ref 101–111)
GFR calc non Af Amer: >60 mL/min (ref >60)
Glucose, Bld: 113 mg/dL — ABNORMAL HIGH (ref 65–99)
Potassium: 3 mmol/L — ABNORMAL LOW (ref 3.5–5.1)
SODIUM: 138 mmol/L (ref 135–145)

## 2015-06-20 LAB — CBC
HCT: 36.7 % — ABNORMAL LOW (ref 39.0–52.0)
Hemoglobin: 11.6 g/dL — ABNORMAL LOW (ref 13.0–17.0)
MCH: 27.6 pg (ref 26.0–34.0)
MCHC: 31.6 g/dL (ref 30.0–36.0)
MCV: 87.4 fL (ref 78.0–100.0)
Platelets: 335 10*3/uL (ref 150–400)
RBC: 4.2 MIL/uL — ABNORMAL LOW (ref 4.22–5.81)
RDW: 15.9 % — AB (ref 11.5–15.5)
WBC: 9.6 10*3/uL (ref 4.0–10.5)

## 2015-06-20 MED ORDER — PANTOPRAZOLE SODIUM 40 MG PO TBEC
40.0000 mg | DELAYED_RELEASE_TABLET | Freq: Two times a day (BID) | ORAL | Status: DC
  Administered 2015-06-20 – 2015-06-26 (×13): 40 mg via ORAL
  Filled 2015-06-20 (×13): qty 1

## 2015-06-20 MED ORDER — POTASSIUM CHLORIDE CRYS ER 20 MEQ PO TBCR
40.0000 meq | EXTENDED_RELEASE_TABLET | Freq: Once | ORAL | Status: AC
  Administered 2015-06-20: 40 meq via ORAL
  Filled 2015-06-20: qty 2

## 2015-06-20 NOTE — Plan of Care (Signed)
Problem: Phase I Progression Outcomes Goal: OOB as tolerated unless otherwise ordered Outcome: Not Progressing Patient refused PT

## 2015-06-20 NOTE — Evaluation (Signed)
Occupational Therapy Evaluation Patient Details Name: [name] MRN: [id number] DOB: Aug 24, 1951 Today's Date: 06/20/2015    History of Present Illness pt was admitted for prostate abscess which was drained.  He has a h/o respiratory failure/trach, chronic DVT, HTN, COPD, CHF   Clinical Impression   This 64 year old man was admitted for the above.  Pt states he is mod I at home, but struggled and just doesn't do what he can't do.  Eval was limited due to pt's pain. Goals in acute are for min A overall.    Follow Up Recommendations  SNF    Equipment Recommendations   (?3:1--to be further assessed)    Recommendations for Other Services       Precautions / Restrictions Precautions Precautions: Fall (trach) Precaution Comments: pt has back pain Restrictions Weight Bearing Restrictions: No      Mobility Bed Mobility Overal bed mobility: Needs Assistance Bed Mobility: Rolling Rolling: Min assist         General bed mobility comments: to L using bedrail  Transfers                 General transfer comment: not tested    Balance                                            ADL Overall ADL's : Needs assistance/impaired     Grooming: Wash/dry hands;Wash/dry face;Set up;Bed level   Upper Body Bathing: Minimal assitance;Bed level       Upper Body Dressing : Moderate assistance;Bed level                     General ADL Comments: pt seen only at bed level.  Pt rolled to L side but did not want to get OOB due to back pain.  Educated on need to sit up to increase strength.     Vision     Perception     Praxis      Pertinent Vitals/Pain Pain Assessment: Faces Faces Pain Scale: Hurts whole lot Pain Location: back Pain Descriptors / Indicators: Aching Pain Intervention(s): Limited activity within patient's tolerance;Monitored during session;Premedicated before session;Repositioned     Hand Dominance Left    Extremity/Trunk Assessment Upper Extremity Assessment Upper Extremity Assessment: RUE deficits/detail RUE Deficits / Details: s/p RUE sx --only able to lift approximately 20 degrees actively           Communication Communication Communication: Passy-Muir valve (wfls)   Cognition Arousal/Alertness: Awake/alert Behavior During Therapy: WFL for tasks assessed/performed Overall Cognitive Status: No family/caregiver present to determine baseline cognitive functioning                 General Comments: pt vague at times; difficult to understand context of what he was saying at other times   General Comments       Exercises       Shoulder Instructions      Home Living Family/patient expects to be discharged to:: Private residence Living Arrangements: Alone                               Additional Comments: pt states that he did what he could and didn't eat sometimes.  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      Prior Functioning/Environment Level of Independence: Independent with assistive device(s)  Comments: did what he could    OT Diagnosis: Acute pain;Generalized weakness   OT Problem List: Decreased strength;Decreased activity tolerance;Decreased knowledge of use of DME or AE;Pain;Decreased cognition (balance NT)   OT Treatment/Interventions: Self-care/ADL training;DME and/or AE instruction;Patient/family education;Cognitive remediation/compensation;Energy conservation    OT Goals([place] goals can be found in the care plan section) Acute Rehab OT Goals Patient Stated Goal: none stated OT Goal Formulation: With patient Time For Goal Achievement: 06/27/15 Potential to Achieve Goals: Fair ADL Goals Pt Will Transfer to Toilet: with min assist;with +2 assist;bedside commode;stand pivot transfer Additional ADL Goal #1: pt will perform UB adls with set up from seated postiion Additional ADL Goal #2: pt will perform LB adls with AE, sit to stand with  min A  OT Frequency: Min 2X/week   Barriers to D/C:            Co-evaluation              End of Session    Activity Tolerance: Patient limited by pain Patient left: in bed;with call bell/phone within reach   Time: [id number] OT Time Calculation (min): 24 min Charges:  OT General Charges $OT Visit: 1 Procedure OT Evaluation $Initial OT Evaluation Tier I: 1 Procedure G-Codes:    Berlie Hatchel 06/20/2015, 2:07 PM  Marica OtterMaryellen Cameka Rae, OTR/L [phone] 06/20/2015

## 2015-06-20 NOTE — Progress Notes (Signed)
PT Cancellation Note  Patient Details Name: [name] MRN: [id number] DOB: [birth year]   Cancelled Treatment:    Reason Eval/Treat Not Completed: Pain limiting ability to participate Per OT, back pain limiting mobility at this time.  Pt only able to tolerate rolling with OT.  Will check back as schedule permits.   [name],[name] 06/20/2015, 3:38 PM Zenovia JarredKati Vi Biddinger, PT, DPT 06/20/2015 Pager: [phone]

## 2015-06-20 NOTE — Progress Notes (Addendum)
Patient ID: [name], male   DOB: 1951-05-18, 64 y.o.   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 He also has had increased brownish secretions from his trach but no dyspnea or chest pain.LL edema has been worsening.INR has been supra-therapeutic: he was told to hold Coumadin last Thursday as INR was 6.8 in clinic. No other complaints.   Assessment/Plan:    Principal Problem:   Abscess, prostate - per urologist, pt is now s/p drain of prostate abscess, post op day #1 - continue broad spectrum ABX vancomycin and zosyn day #3 for now - f/u urine culture, preliminary report positive for E. Coli, final report still pending  - Monitor Cr, UOP  Active Problems:   Sepsis due to Gram-negative bacteremia ([place]) - please note that pt met criteria for sepsis on admission with HR > 80, WBC > 12.3, ? source UTI, prostate abscess  - preliminary report with g-rods and since urine culture with E. Coli this is the likely source - continue [name] ABX regimen, no plan to change until final cultures report back  - repeat blood cultures to ensure resolution     Chronic deep vein thrombosis (DVT) ([place]) - on Coumadin at home but has been on hold for now until abscess drain done - may resume this AM     Obesity hypoventilation syndrome (HCC) - trach collar in place    Hypokalemia - still low  - supplement and repeat BMP in AM    BMI 50.0-59.9, adult (HCC) - Body mass index is 41.9 kg/(m^2)    Tobacco abuse - cessation consultation provided     Tracheostomy status ([place]) - monitor oxygen  saturation     Essential hypertension, benign - reasonable inpatient control     Chronic diastolic congestive heart failure (HCC) - weight 140 kg this AM, was 138 kg on admission - stop IVF and continue home regimen with Lasix   DVT prophylaxis - SCD's  Code Status: Full.  Family Communication:  plan of care discussed with the patient Disposition Plan: Home when stable  IV access:  Peripheral IV  Procedures and diagnostic studies:    Ct Abdomen Pelvis W Contrast 06/17/2015  Peripherally enhancing fluid collection or collections centered about the right-side of the prostate and pelvic cul-de-sac. Suspicious for abscess/ abscesses. 2. Air within the urinary bladder and left renal collecting system/ureter. Suspicious for ascending infection. Edema in the left hemipelvis, along the course of the left ureter, is likely secondary. 3. Fluid-filled large and small bowel loops are suspicious for adynamic ileus. No focal transition point identified. 4. Bilateral nephrolithiasis. 5.  Atherosclerosis, including within the coronary arteries. 6. Mild motion degradation.   Dg Chest Port 1 View 06/17/2015  Cardiomegaly with mild pulmonary vascular congestion.   Medical Consultants:  Urology   Other Consultants:  None  IAnti-Infectives:   Vancomycin 10/11 --> Zosyn 10/11 -->   Nicholas Ramsay, MD  [facility] Pager [phone]   If 7PM-7AM, please contact night-coverage [email or web address] Password [facility] 06/20/2015, 6:39 AM   LOS: 3 days   HPI/Subjective: No events overnight.   Objective: Filed  Vitals:   06/19/15 2121 06/19/15 2334 06/20/15 0348 06/20/15 0451  BP: 138/71   142/76  Pulse: 85 89 81 82  Temp: 99.2 F (37.3 C)   98.9 F (37.2 C)  TempSrc: Oral   Oral  Resp: $Remo'20 20 18 20  'BRKkO$ Height:      Weight:    131.997 kg (291 lb)  SpO2: 95% 93% 92% 93%    Intake/Output Summary (Last 24 hours) at 06/20/15 0639 Last data filed at 06/20/15 0457  Gross per 24 hour  Intake    300 ml   Output   1165 ml  Net   -865 ml    Exam:   General:  Pt is alert, follows commands appropriately, not in acute distress  Cardiovascular: Regular rate and rhythm, S1/S2, no murmurs, no rubs, no gallops  Respiratory: Clear to auscultation bilaterally, diminished breath sounds at bases   Abdomen: Soft, non tender, slightly distended, bowel sounds present, no guarding  Data Reviewed: Basic Metabolic Panel:  Recent Labs Lab 06/13/15 1553 06/17/15 1406 06/19/15 0422 06/20/15 0432  NA 135 137 138 138  K 3.7 3.3* 3.0* 3.0*  CL 96* 101 104 102  CO2 $Re'20 26 24 25  'dBd$ GLUCOSE 127* 137* 118* 113*  BUN 28* 24* 19 16  CREATININE 1.14 0.90 0.76 0.74  CALCIUM 9.0 9.6 9.3 9.3   Liver Function Tests:  Recent Labs Lab 06/17/15 1609  AST 25  ALT 29  ALKPHOS 173*  BILITOT 1.1  PROT 7.9  ALBUMIN 3.1*   CBC:  Recent Labs Lab 06/17/15 1406 06/17/15 2137 06/18/15 0025 06/18/15 0522 06/18/15 1300 06/19/15 0422 06/20/15 0432  WBC 12.6* 12.6*  --   --   --  8.7 9.6  NEUTROABS 11.1* 10.5*  --   --   --   --   --   HGB 13.6 12.6* 12.4* 12.2* 12.2* 11.7* 11.6*  HCT 41.0 38.4* 39.1 38.5* 38.0* 37.6* 36.7*  MCV 86.1 87.5  --   --   --  88.3 87.4  PLT 282 298  --   --   --  325 335   Cardiac Enzymes:  Recent Labs Lab 06/17/15 1609  CKTOTAL 79   Recent Results (from the past 240 hour(s))  Culture, Urine     Status: None (Preliminary result)   Collection Time: 06/17/15  9:04 PM  Result Value Ref Range Status   Specimen Description URINE, CLEAN CATCH  Final   Culture   Final    >=100,000 COLONIES/mL ESCHERICHIA COLI    Report Status PENDING  Incomplete  Culture, blood (routine x 2)     Status: None (Preliminary result)   Collection Time: 06/17/15  9:37 PM  Result Value Ref Range Status   Specimen Description BLOOD BLOOD LEFT FOREARM  Final   Culture  Setup Time   Final    GRAM NEGATIVE RODS    Report Status PENDING  Incomplete    Scheduled Meds: . famotidine  20 mg  Oral Daily  . furosemide  40 mg Oral Daily  . hydrALAZINE  25 mg Oral BID  . metoCLOPramide  10 mg Oral TID AC & HS  . pantoprazole  IV  40 mg Intravenous Q12H  . ZOSYN  IV  3.375 g Intravenous Q8H  . potassium chloride SA  40 mEq Oral Daily  . sertraline  100 mg Oral Daily  . vancomycin  1,000 mg Intravenous Q12H   Continuous Infusions:

## 2015-06-20 NOTE — Progress Notes (Signed)
IR placed abscess in drain yesterday with 30 cc purulent discharge Patient confused overnight  Filed Vitals:   06/19/15 2121 06/19/15 2334 06/20/15 0348 06/20/15 0451  BP: 138/71   142/76  Pulse: 85 89 81 82  Temp: 99.2 F (37.3 C)   98.9 F (37.2 C)  TempSrc: Oral   Oral  Resp: 20 20 18 20   Height:      Weight:    291 lb (131.997 kg)  SpO2: 95% 93% 92% 93%   I/O last 3 completed shifts: In: 300 [IV Piggyback:300] Out: 1665 [Urine:1665]    NAD Soft NT ND No foley  CBC Latest Ref Rng 06/20/2015 06/19/2015 06/18/2015  WBC 4.0 - 10.5 K/uL 9.6 8.7 -  Hemoglobin 13.0 - 17.0 g/dL 11.6(L) 11.7(L) 12.2(L)  Hematocrit 39.0 - 52.0 % 36.7(L) 37.6(L) 38.0(L)  Platelets 150 - 400 K/uL 335 325 -    BMP Latest Ref Rng 06/20/2015 06/19/2015 06/17/2015  Glucose 65 - 99 mg/dL 161(W113(H) 960(A118(H) 540(J137(H)  BUN 6 - 20 mg/dL 16 19 81(X24(H)  Creatinine 0.61 - 1.24 mg/dL 9.140.74 7.820.76 9.560.90  BUN/Creat Ratio 10 - 22 - - -  Sodium 135 - 145 mmol/L 138 138 137  Potassium 3.5 - 5.1 mmol/L 3.0(L) 3.0(L) 3.3(L)  Chloride 101 - 111 mmol/L 102 104 101  CO2 22 - 32 mmol/L 25 24 26   Calcium 8.9 - 10.3 mg/dL 9.3 9.3 9.6    Urine Culture: E. Coli Blood culture: GNR Drain Culture: pending  1. Prostatic abscess/sepsis -continue abscess drain until output is minimal -Monitor vitals, WBC -Monitor Cr, UOP -continue borad spectrum abx pending c/s results  2. Air in the left collecting system and bladder -see above  3. Nonobstructing nephrolithiasis -No further workup this time. This can be addressed in the nonacute setting.

## 2015-06-20 NOTE — Progress Notes (Signed)
This patient is receiving IV Protonix. Based on criteria approved by the Pharmacy and Therapeutics Committee, this medication is being converted to the equivalent oral dose form. These criteria include: . The patient is eating (either orally or per tube) and/or has been taking other orally administered medications for at least 24 hours. . This patient has no evidence of active gastrointestinal bleeding or impaired GI absorption (gastrectomy, short bowel, patient on TNA or NPO).  If you have questions about this conversion, please contact the pharmacy department.  Thank you.  [name], PharmD, BCPS Pager: [phone] 06/20/2015 9:45 AM

## 2015-06-20 NOTE — Addendum Note (Signed)
Addended by: Karl PockFORD, Heydi Swango N on: 06/20/2015 05:11 PM   Modules accepted: Orders

## 2015-06-20 NOTE — Progress Notes (Signed)
OT Cancellation Note  Patient Details Name: Fayrene FearingJames Sherley MRN: [id number] DOB: 03-26-1951   Cancelled Treatment:    Reason Eval/Treat Not Completed: Pain limiting ability to participate.  Pt is requesting pain meds for back. Will return later.  Morgan Keinath 06/20/2015, 11:16 AM  Marica OtterMaryellen Eliyah Bazzi, OTR/L [phone] 06/20/2015

## 2015-06-20 NOTE — Progress Notes (Signed)
Pt restless and confused off and on throughout the night. Pulled JP bulb from peri drain off, cleaned and replace bulb to suction. Unable to console pt. SRP, RN

## 2015-06-20 NOTE — Progress Notes (Signed)
Patient ID: [name], male   DOB: 06/21/1951, 64 y.o.   MRN: [id number]    Referring Physician(s): Budzyn  Chief Complaint:  Prostatic abscess  Subjective:  Pt resting quietly; had some confusion overnight per nursing; currently talkative, answering questions ok  Allergies: Review of patient's allergies indicates no known allergies.  Medications: Prior to Admission medications   Medication Sig Start Date End Date Taking? Authorizing Provider  furosemide (LASIX) 40 MG tablet Take 1 tablet (40 mg total) by mouth daily. 06/13/15  Yes Ruben Im, MD  ipratropium-albuterol (DUONEB) 0.5-2.5 (3) MG/3ML SOLN Take 3 mLs by nebulization every 6 (six) hours as needed.   Yes Historical Provider, MD  isosorbide dinitrate (ISORDIL) 20 MG tablet Take 20 mg by mouth 2 (two) times daily.   Yes Historical Provider, MD  lidocaine (LIDODERM) 5 % Place 1 patch onto the skin daily. Remove & Discard patch within 12 hours or as directed by MD To right shoulder   Yes Historical Provider, MD  potassium chloride SA (K-DUR,KLOR-CON) 20 MEQ tablet Take 2 tablets (40 mEq total) by mouth 4 (four) times daily. 04/22/15  Yes Sharee Holster, NP  sertraline (ZOLOFT) 100 MG tablet Take 100 mg by mouth daily.   Yes Historical Provider, MD  warfarin (COUMADIN) 10 MG tablet Take 10 mg by mouth daily. 05/31/15  Yes Historical Provider, MD  clonazePAM (KLONOPIN) 0.5 MG tablet Take 0.5 mg by mouth 3 (three) times daily.    Historical Provider, MD  famotidine (PEPCID) 20 MG tablet Take 20 mg by mouth daily.    Historical Provider, MD  folic acid (FOLVITE) 1 MG tablet Take 1 mg by mouth daily.    Historical Provider, MD  hydrALAZINE (APRESOLINE) 25 MG tablet Take 37.5 mg by mouth 2 (two) times daily.    Historical Provider, MD  metoCLOPramide (REGLAN) 10 MG tablet Take 10 mg by mouth 4 (four) times daily -  before meals and at bedtime.    Historical Provider, MD  metolazone (ZAROXOLYN) 5 MG tablet Take 5 mg by mouth daily.     Historical Provider, MD  Omega-3 Fatty Acids (FISH OIL) 1000 MG CAPS Take 1,000 mg by mouth daily.     Historical Provider, MD  ondansetron (ZOFRAN) 4 MG tablet Take 4 mg by mouth every 6 (six) hours.    Historical Provider, MD     Vital Signs: BP 142/76 mmHg  Pulse 69  Temp(Src) 98.9 F (37.2 C) (Oral)  Resp 18  Ht 6' (1.829 m)  Wt 291 lb (131.997 kg)  BMI 39.46 kg/m2  SpO2 90%  Physical Exam Gluteal drain intact, insertion site dry, NT, output 60 cc turbid, beige fluid; cx's pend; drain irrigated with sterile NS without difficulty  Imaging: Ct Abdomen Pelvis W Contrast  06/17/2015  CLINICAL DATA:  Nausea and vomiting. Back pain and swelling in legs. Possible urine stool incontinence. Morbid obesity. COPD. Congestive heart failure. EXAM: CT ABDOMEN AND PELVIS WITH CONTRAST TECHNIQUE: Multidetector CT imaging of the abdomen and pelvis was performed using the standard protocol following bolus administration of intravenous contrast. CONTRAST:  OMNIPAQUE IOHEXOL 300 MG/ML  SOLN COMPARISON:  01/28/2015 FINDINGS: Lower chest: Motion degradation throughout. Grossly clear lung bases. Mild cardiomegaly. Multivessel coronary artery atherosclerosis. Hepatobiliary: Mild hepatic steatosis, without focal liver lesion. Normal gallbladder, without biliary ductal dilatation. Pancreas: Normal, without mass or ductal dilatation. Periampullary duodenal diverticulum. Spleen: Normal in size, without focal abnormality. Adrenals/Urinary Tract: Normal adrenal glands. Bilateral renal collecting system calculi. Largest stone is  in the interpolar left kidney at 11 mm. Too small to characterize interpolar left renal lesion. A lower pole right renal lesion measures 13 mm and is likely a cyst. Air within the left renal collecting system and left ureter proximally. There is edema along the course of the left ureter, including at the left pelvic brim on image/series 74/2. Air is identified within the urinary bladder.  No hydroureter or ureteric stone seen. Stomach/Bowel: Mild gastric distension. Descending duodenal diverticulum. Normal terminal ileum and appendix. Small bowel loops are fluid-filled, measuring up to 3.7 cm. No transition point is identified. No pneumatosis or free intraperitoneal air. Colon is gas and fluid filled. Scattered colonic diverticula. No colonic wall thickening. Vascular/Lymphatic: Aortic and branch vessel atherosclerosis. Small retroperitoneal nodes are not pathologic by size criteria. IVC filter is appropriately positioned. No pelvic adenopathy. Reproductive: Multiloculated fluid collection or collections centered about the right-side of the prostate and pelvic cul-de-sac. In conglomerate, measure on the order of 5.5 x 5.9 cm on image/series 94/2. Other: No significant free fluid. Musculoskeletal: Degenerative partial fusion of the bilateral sacroiliac joints. IMPRESSION: 1. Peripherally enhancing fluid collection or collections centered about the right-side of the prostate and pelvic cul-de-sac. Suspicious for abscess/ abscesses. 2. Air within the urinary bladder and left renal collecting system/ureter. Suspicious for ascending infection. Edema in the left hemipelvis, along the course of the left ureter, is likely secondary. 3. Fluid-filled large and small bowel loops are suspicious for adynamic ileus. No focal transition point identified. 4. Bilateral nephrolithiasis. 5.  Atherosclerosis, including within the coronary arteries. 6. Mild motion degradation. These results were called by telephone at the time of interpretation on 06/17/2015 at 5:35 pm to PA. SHARI UPSTILL , who verbally acknowledged these results. Electronically Signed   By: Jeronimo Greaves M.D.   On: 06/17/2015 17:35   Dg Chest Port 1 View  06/17/2015  CLINICAL DATA:  Cough for 1 day. EXAM: PORTABLE CHEST 1 VIEW COMPARISON:  02/21/2015 and prior radiographs FINDINGS: Cardiomegaly and mild pulmonary vascular congestion noted. An  endotracheal tube is present with tip 7 cm above the carina. There is no evidence of focal airspace disease, pulmonary edema, suspicious pulmonary nodule/mass, pleural effusion, or pneumothorax. No acute bony abnormalities are identified. IMPRESSION: Cardiomegaly with mild pulmonary vascular congestion. Electronically Signed   By: Harmon Pier M.D.   On: 06/17/2015 19:32   Ct Image Guided Drainage Percut Cath  Peritoneal Retroperit  06/19/2015  CLINICAL DATA:  64 year old with a prostate abscess. Request for image guided drainage. EXAM: CT-GUIDED DRAINAGE OF PROSTATE ABSCESS Physician: Rachelle Hora. Lowella Dandy, MD FLUOROSCOPY TIME:  None MEDICATIONS: 2 mg Versed, 50 mcg fentanyl. A radiology nurse monitored the patient for moderate sedation. ANESTHESIA/SEDATION: Moderate sedation time: 20 minutes PROCEDURE: The procedure was explained to the patient. The risks and benefits of the procedure were discussed and the patient's questions were addressed. Informed consent was obtained from the patient. The patient was placed prone and CT images through the pelvis were obtained. The fluid collection along the right side of the prostate was targeted. Gluteal region was prepped with Betadine and a sterile field was created. Skin was anesthetized with 1% lidocaine. An 18 gauge needle was directed into the fluid collection along the right side of the prostate. Yellow purulent fluid was aspirated. A stiff Amplatz wire was advanced into the collection. The tract was dilated to accommodate a 10 [place] multipurpose drain. Approximately 30 mL of purulent fluid was removed. Catheter was attached to a suction bulb and sutured  in place. Dressing placed over the drain. FINDINGS: Fluid collection along the right side of the prostate compatible with an abscess. 30 mL of purulent fluid was removed. Estimated blood loss: Minimal COMPLICATIONS: None IMPRESSION: CT-guided drainage of the prostate abscess. Electronically Signed   By: Richarda OverlieAdam  Henn M.D.    On: 06/19/2015 18:04    Labs:  CBC:  Recent Labs  06/17/15 1406 06/17/15 2137  06/18/15 0522 06/18/15 1300 06/19/15 0422 06/20/15 0432  WBC 12.6* 12.6*  --   --   --  8.7 9.6  HGB 13.6 12.6*  < > 12.2* 12.2* 11.7* 11.6*  HCT 41.0 38.4*  < > 38.5* 38.0* 37.6* 36.7*  PLT 282 298  --   --   --  325 335  < > = values in this interval not displayed.  COAGS:  Recent Labs  01/09/15 1510 01/28/15 2009  06/13/15 1551 06/17/15 1609 06/18/15 0721 06/19/15 0422  INR 1.07 1.22  < > 6.8 3.15* 2.29* 1.30  APTT 25 55*  --   --   --   --   --   < > = values in this interval not displayed.  BMP:  Recent Labs  06/13/15 1553 06/17/15 1406 06/19/15 0422 06/20/15 0432  NA 135 137 138 138  K 3.7 3.3* 3.0* 3.0*  CL 96* 101 104 102  CO2 20 26 24 25   GLUCOSE 127* 137* 118* 113*  BUN 28* 24* 19 16  CALCIUM 9.0 9.6 9.3 9.3  CREATININE 1.14 0.90 0.76 0.74  GFRNONAA 69 >60 >60 >60  GFRAA 79 >60 >60 >60    LIVER FUNCTION TESTS:  Recent Labs  01/02/15 0430  01/24/15 0342  01/30/15 0655 03/04/15 0555 03/07/15 0553 03/14/15 0552 06/17/15 1609  BILITOT 1.9*  --  1.4*  --  0.8  --   --   --  1.1  AST 45*  --  57*  --  45*  --   --   --  25  ALT 29  --  36  --  49  --   --   --  29  ALKPHOS 80  --  91  --  113  --   --   --  173*  PROT 7.2  --  6.1*  --  6.8  --   --   --  7.9  ALBUMIN 3.3*  < > 2.5*  < > 2.8* 3.4* 3.2* 3.2* 3.1*  < > = values in this interval not displayed.  Assessment and Plan:  Pt s/p drainage of prostatic abscess 10/12; check final cx's; afebrile; WBC/hgb/creat ok; replace K- currently 3.0; cont drain irrigation with sterile NS TID; check f/u CT once output sig diminishes; other plans as per urology/[facility].  Signed: D. Jeananne RamaKevin Pragya Lofaso 06/20/2015, 2:18 PM   I spent a total of 15 minutes at the the patient's bedside AND on the patient's hospital floor or unit, greater than 50% of which was counseling/coordinating care for prostatic abscess drain

## 2015-06-20 NOTE — Progress Notes (Signed)
Patient refused to sit up to take Reglan pill at 1719.  Patient says he takes he's pills at home lying down, I informed patient I would not administer his pills unless he sat up. He refused so I informed him he wouldn't be getting his Reglan dose, he said that was fine. Morene CrockerKimberly Rome Echavarria, RN-BSN, 06/20/2015, [phone]

## 2015-06-21 ENCOUNTER — Inpatient Hospital Stay (HOSPITAL_COMMUNITY): Payer: No Typology Code available for payment source

## 2015-06-21 LAB — BASIC METABOLIC PANEL
Anion gap: 9 (ref 5–15)
BUN: 14 mg/dL (ref 6–20)
CALCIUM: 9.6 mg/dL (ref 8.9–10.3)
CO2: 31 mmol/L (ref 22–32)
CREATININE: 0.78 mg/dL (ref 0.61–1.24)
Chloride: 94 mmol/L — ABNORMAL LOW (ref 101–111)
GFR calc Af Amer: >60 mL/min (ref >60)
GFR calc non Af Amer: >60 mL/min (ref >60)
GLUCOSE: 126 mg/dL — AB (ref 65–99)
Potassium: 2.9 mmol/L — ABNORMAL LOW (ref 3.5–5.1)
Sodium: 134 mmol/L — ABNORMAL LOW (ref 135–145)

## 2015-06-21 LAB — CBC
HEMATOCRIT: 37.2 % — AB (ref 39.0–52.0)
Hemoglobin: 12.1 g/dL — ABNORMAL LOW (ref 13.0–17.0)
MCH: 27.7 pg (ref 26.0–34.0)
MCHC: 32.5 g/dL (ref 30.0–36.0)
MCV: 85.1 fL (ref 78.0–100.0)
Platelets: 373 10*3/uL (ref 150–400)
RBC: 4.37 MIL/uL (ref 4.22–5.81)
RDW: 15.4 % (ref 11.5–15.5)
WBC: 11 10*3/uL — ABNORMAL HIGH (ref 4.0–10.5)

## 2015-06-21 LAB — CULTURE, BLOOD (ROUTINE X 2)

## 2015-06-21 MED ORDER — CEFTRIAXONE SODIUM 2 G IJ SOLR
2.0000 g | INTRAMUSCULAR | Status: DC
  Administered 2015-06-21 – 2015-06-24 (×4): 2 g via INTRAVENOUS
  Filled 2015-06-21 (×4): qty 2

## 2015-06-21 MED ORDER — POTASSIUM CHLORIDE CRYS ER 20 MEQ PO TBCR
40.0000 meq | EXTENDED_RELEASE_TABLET | Freq: Two times a day (BID) | ORAL | Status: AC
  Administered 2015-06-21 (×2): 40 meq via ORAL
  Filled 2015-06-21 (×2): qty 2

## 2015-06-21 MED ORDER — DEXTROSE 5 % IV SOLN: 1.0000 g | INTRAVENOUS | Status: DC

## 2015-06-21 NOTE — Progress Notes (Signed)
Internal Medicine Clinic Attending  I saw and evaluated the patient.  I personally confirmed the key portions of the history and exam documented by Dr. Ford and I reviewed pertinent patient test results.  The assessment, diagnosis, and plan were formulated together and I agree with the documentation in the resident's note. 

## 2015-06-21 NOTE — Progress Notes (Signed)
Patient ID: [name], male   DOB: 1950-11-26, 64 y.o.   MRN: [id number]  [facility] PROGRESS NOTE  [name] CBU:[id number] DOB: 1951/05/19 DOA: 06/17/2015 PCP: Liberty Handy, MD   Brief narrative:     Patient with history of chronic DVT s/p IVC filter on coumadin, COPD, diastolic HF, HTN with recent admission with respiratory failure s/p trach who presented with LLQ pain : started 4 days PTA, getting worse, no fever or chills, with diarrhea, no vomiting or nausea, with brownish urine and dysuria. He also has had increased brownish secretions from his trach but no dyspnea or chest pain.LL edema has been worsening.INR has been supra-therapeutic: he was told to hold Coumadin last Thursday as INR was 6.8 in clinic. No other complaints.   Assessment/Plan:    Principal Problem:   Abscess, prostate - per urologist, pt is now s/p drain of prostate abscess, post op day #2 - cultures from abscess also with g-rods  - urine and blood cultures positive for E. Coli, pan sensitive, change to Rocephin for now as pt says he has nausea  - Monitor Cr, UOP  Active Problems:   Sepsis due to E. Coli  - please note that pt met criteria for sepsis on admission with HR > 80, WBC > 12.3, ? source UTI, prostate abscess  - blood and urine culture positive for E. Coli, change ABX to Rocephin as noted above  - repeat blood cultures 10/13 --> no growth to date     Chronic deep vein thrombosis (DVT) (HCC) - on Coumadin at home but has been on hold for now until abscess drain done - resume per pharmacy     Obesity hypoventilation syndrome ([place]) - trach collar in place - more gargles this AM, CXR requested     Hypokalemia - still low  - continue to supplement and repeat BMP in AM    BMI 50.0-59.9, adult (HCC) - Body mass index is 41.9 kg/(m^2)    Tobacco abuse - cessation consultation provided     Tracheostomy status ([place]) - monitor oxygen saturation  - pt has refused trach maintenance and  has refused to sit up as recommended so this is most likely the reason for gargles this AM - follow up CXR    Essential hypertension, benign - reasonable inpatient control     Chronic diastolic congestive heart failure ([place]) - weight 123 kg this AM, was 138 kg on admission - continue home regimen with Lasix  - monitor daily weights, strict I/O  DVT prophylaxis - SCD's  Code Status: Full.  Family Communication:  plan of care discussed with the patient Disposition Plan: Home when stable  IV access:  Peripheral IV  Procedures and diagnostic studies:    Ct Abdomen Pelvis W Contrast 06/17/2015  Peripherally enhancing fluid collection or collections centered about the right-side of the prostate and pelvic cul-de-sac. Suspicious for abscess/ abscesses. 2. Air within the urinary bladder and left renal collecting system/ureter. Suspicious for ascending infection. Edema in the left hemipelvis, along the course of the left ureter, is likely secondary. 3. Fluid-filled large and small bowel loops are suspicious for adynamic ileus. No focal transition point identified. 4. Bilateral nephrolithiasis. 5.  Atherosclerosis, including within the coronary arteries. 6. Mild motion degradation.   Dg Chest Port 1 View 06/17/2015  Cardiomegaly with mild pulmonary vascular congestion.   Medical Consultants:  Urology   Other Consultants:  None  IAnti-Infectives:   Vancomycin 10/11 --> 10/14 Zosyn 10/11 --> 10/14 Rocephin 10/14 -->  Faye Ramsay, MD  [facility] Pager [phone]   If 7PM-7AM, please contact night-coverage [email or web address] Password [email or web address] 06/21/2015, 11:21 AM   LOS: 4 days   HPI/Subjective: No events overnight.   Objective: Filed Vitals:   06/21/15 0224 06/21/15 0400 06/21/15 0508 06/21/15 0840  BP:   117/79   Pulse: 83  79 76  Temp:   98.6 F (37 C)   TempSrc:   Oral   Resp: $Remo'18  20 18  'NJnlD$ Height:      Weight:   123.378 kg (272 lb)   SpO2: 94% 95% 92% 95%    Intake/Output  Summary (Last 24 hours) at 06/21/15 1121 Last data filed at 06/21/15 1001  Gross per 24 hour  Intake   1135 ml  Output   3155 ml  Net  -2020 ml    Exam:   General:  Pt is alert, follows commands appropriately, not in acute distress  Cardiovascular: Regular rate and rhythm, S1/S2, no murmurs, no rubs, no gallops  Respiratory: Clear to auscultation bilaterally, diminished breath sounds at bases with upper resp gargles   Abdomen: Soft, non tender, slightly distended, bowel sounds present but faint, no guarding  Data Reviewed: Basic Metabolic Panel:  Recent Labs Lab 06/17/15 1406 06/19/15 0422 06/20/15 0432 06/21/15 0500  NA 137 138 138 134*  K 3.3* 3.0* 3.0* 2.9*  CL 101 104 102 94*  CO2 $Re'26 24 25 31  'zVK$ GLUCOSE 137* 118* 113* 126*  BUN 24* $Remov'19 16 14  'iOXyYY$ CREATININE 0.90 0.76 0.74 0.78  CALCIUM 9.6 9.3 9.3 9.6   Liver Function Tests:  Recent Labs Lab 06/17/15 1609  AST 25  ALT 29  ALKPHOS 173*  BILITOT 1.1  PROT 7.9  ALBUMIN 3.1*   CBC:  Recent Labs Lab 06/17/15 1406 06/17/15 2137  06/18/15 0522 06/18/15 1300 06/19/15 0422 06/20/15 0432 06/21/15 0500  WBC 12.6* 12.6*  --   --   --  8.7 9.6 11.0*  NEUTROABS 11.1* 10.5*  --   --   --   --   --   --   HGB 13.6 12.6*  < > 12.2* 12.2* 11.7* 11.6* 12.1*  HCT 41.0 38.4*  < > 38.5* 38.0* 37.6* 36.7* 37.2*  MCV 86.1 87.5  --   --   --  88.3 87.4 85.1  PLT 282 298  --   --   --  325 335 373  < > = values in this interval not displayed. Cardiac Enzymes:  Recent Labs Lab 06/17/15 1609  CKTOTAL 79   Recent Results (from the past 240 hour(s))  Culture, Urine     Status: None   Collection Time: 06/17/15  9:04 PM  Result Value Ref Range Status   Specimen Description URINE, CLEAN CATCH  Final   Special Requests NONE  Final   Culture   Final    >=100,000 COLONIES/mL ESCHERICHIA COLI Performed at [facility]    Report Status 06/20/2015 FINAL  Final   Organism ID, Bacteria ESCHERICHIA COLI  Final       Susceptibility   Escherichia coli - MIC*    AMPICILLIN <=2 SENSITIVE Sensitive     CEFAZOLIN <=4 SENSITIVE Sensitive     CEFTRIAXONE <=1 SENSITIVE Sensitive     CIPROFLOXACIN <=0.25 SENSITIVE Sensitive     GENTAMICIN <=1 SENSITIVE Sensitive     IMIPENEM <=0.25 SENSITIVE Sensitive     NITROFURANTOIN <=16 SENSITIVE Sensitive     TRIMETH/SULFA <=20 SENSITIVE Sensitive     AMPICILLIN/SULBACTAM <=2 SENSITIVE  Sensitive     PIP/TAZO <=4 SENSITIVE Sensitive     * >=100,000 COLONIES/mL ESCHERICHIA COLI  Culture, blood (routine x 2)     Status: None (Preliminary result)   Collection Time: 06/17/15  9:24 PM  Result Value Ref Range Status   Specimen Description BLOOD LEFT ARM  Final   Special Requests BOTTLES DRAWN AEROBIC AND ANAEROBIC 10 CC  Final   Culture   Final    NO GROWTH 2 DAYS Performed at [facility]    Report Status PENDING  Incomplete  Culture, blood (routine x 2)     Status: None   Collection Time: 06/17/15  9:37 PM  Result Value Ref Range Status   Specimen Description BLOOD BLOOD LEFT FOREARM  Final   Special Requests IN PEDIATRIC BOTTLE 4 CC  Final   Culture  Setup Time   Final    GRAM NEGATIVE RODS IN PEDIATRIC BOTTLE CRITICAL RESULT CALLED TO, READ BACK BY AND VERIFIED WITH: SOPHIA $RemoveBefo'[email or web address]$  06/19/15 MKELLY    Culture   Final    ESCHERICHIA COLI Performed at [facility]    Report Status 06/21/2015 FINAL  Final   Organism ID, Bacteria ESCHERICHIA COLI  Final      Susceptibility   Escherichia coli - MIC*    AMPICILLIN <=2 SENSITIVE Sensitive     CEFAZOLIN <=4 SENSITIVE Sensitive     CEFEPIME <=1 SENSITIVE Sensitive     CEFTAZIDIME <=1 SENSITIVE Sensitive     CEFTRIAXONE <=1 SENSITIVE Sensitive     CIPROFLOXACIN <=0.25 SENSITIVE Sensitive     GENTAMICIN <=1 SENSITIVE Sensitive     IMIPENEM <=0.25 SENSITIVE Sensitive     TRIMETH/SULFA <=20 SENSITIVE Sensitive     AMPICILLIN/SULBACTAM <=2 SENSITIVE Sensitive     * ESCHERICHIA COLI  Culture,  routine-abscess     Status: None (Preliminary result)   Collection Time: 06/19/15  5:11 PM  Result Value Ref Range Status   Specimen Description PROSTATE  Final   Special Requests NONE  Final   Gram Stain   Final    ABUNDANT WBC PRESENT,BOTH PMN AND MONONUCLEAR NO SQUAMOUS EPITHELIAL CELLS SEEN RARE GRAM NEGATIVE RODS Performed at [facility]    Culture   Final    MODERATE GRAM NEGATIVE RODS Performed at [facility]    Report Status PENDING  Incomplete    Scheduled Meds: . famotidine  20 mg Oral Daily  . furosemide  40 mg Oral Daily  . hydrALAZINE  25 mg Oral BID  . metoCLOPramide  10 mg Oral TID AC & HS  . pantoprazole  IV  40 mg Intravenous Q12H  . ZOSYN  IV  3.375 g Intravenous Q8H  . potassium chloride SA  40 mEq Oral Daily  . sertraline  100 mg Oral Daily  . vancomycin  1,000 mg Intravenous Q12H   Continuous Infusions:

## 2015-06-21 NOTE — Progress Notes (Signed)
Occupational Therapy Treatment Patient Details Name: [name] MRN: [id number] DOB: 03-Jan-1951 Today's Date: 06/21/2015    History of present illness Pt is a 64 year old male s/p drainage of prostatic abscess 10/12 and transgluteal drain placed.  He has a h/o respiratory failure/trach, chronic DVT, HTN, COPD, CHF   OT comments  Pt agreeable to sitting up and standing briefly today. Still limited by back pain and endurance  Follow Up Recommendations  SNF    Equipment Recommendations  3 in 1 bedside comode    Recommendations for Other Services      Precautions / Restrictions Precautions Precautions: Fall Precaution Comments: trach, back pain       Mobility Bed Mobility Overal bed mobility: Needs Assistance Bed Mobility: Rolling;Sidelying to Sit;Sit to Supine Rolling: Min assist Sidelying to sit: Mod assist   Sit to supine: Mod assist;+2 for physical assistance   General bed mobility comments: vcs for bending leg, reaching for rail, assist for trunk to roll, legs and trunk to sit up, and legs to lie down  Transfers Overall transfer level: Needs assistance Equipment used: Standard walker Transfers: Sit to/from Stand Sit to Stand: Min assist;+2 safety/equipment;From elevated surface         General transfer comment: bed elevated, pt only required min assist to rise and steady, pt declined further mobility    Balance Overall balance assessment: Needs assistance Sitting-balance support: Feet supported;No upper extremity supported Sitting balance-Leahy Scale: Fair     Standing balance support: Bilateral upper extremity supported;During functional activity Standing balance-Leahy Scale: Poor Standing balance comment: requires UE support                   ADL                                         General ADL Comments: Pt needed to be cleaned up; rolled with min A for hygiene and to replace chux pads.  Sats EOB, with increased back pain  initially.  Pt wanted to lie down after a few minutes.  Agreeable to standing so that bed could be moved down as he was near foot of bed.  Did not want to try to sit up in chair this session      Vision                     Perception     Praxis      Cognition   Behavior During Therapy: [facility] for tasks assessed/performed Overall Cognitive Status: Within Functional Limits for tasks assessed                       Extremity/Trunk Assessment      Lower Extremity Assessment Lower Extremity Assessment: Generalized weakness (able to move against gravity, body habitus limiting AROM)        Exercises     Shoulder Instructions       General Comments      Pertinent Vitals/ Pain       Pain Assessment: Faces Pain Score: 10-Worst pain ever Faces Pain Scale: Hurts even more Pain Location: back when first sitting up Pain Descriptors / Indicators: Aching;Tightness;Grimacing Pain Intervention(s): Limited activity within patient's tolerance;Monitored during session;Premedicated before session;Repositioned  Home Living Family/patient expects to be discharged to:: Private residence Living Arrangements: Alone  Home Equipment: Walker - 2 wheels   Additional Comments: pt states that he did what he could and didn't eat sometimes.  Neighbor, Glass blower/designerCharlie, got groceries      Prior Functioning/Environment Level of Independence: Independent with assistive device(s)            Frequency Min 2X/week     Progress Toward Goals  OT Goals([place] goals can now be found in the care plan section)  Progress towards OT goals: Progressing toward goals  Acute Rehab OT Goals Patient Stated Goal: agreeable to mobility today  Plan      Co-evaluation      Reason for Co-Treatment: For patient/therapist safety PT goals addressed during session: Mobility/safety with mobility OT goals addressed during session: ADL's and self-care      End of  Session     Activity Tolerance Patient limited by pain   Patient Left in bed;with call bell/phone within reach   Nurse Communication          Time: [id number] OT Time Calculation (min): 36 min  Charges: OT General Charges $OT Visit: 1 Procedure OT Treatments $Therapeutic Activity: 8-22 mins  [name] 06/21/2015, 2:39 PM  [name], OTR/L [phone] 06/21/2015

## 2015-06-21 NOTE — Progress Notes (Signed)
Patient ID: [name] Tietje, male   DOB: 01-27-1951, 64 y.o.   MRN: [id number]    Referring Physician(s): Budzyn,B  Chief Complaint:  Prostatic abscess  Subjective: Patient without new complaints;  currently undergoing PT; denies significant pain at transgluteal drain site   Allergies: Review of patient's allergies indicates no known allergies.  Medications: Prior to Admission medications   Medication Sig Start Date End Date Taking? Authorizing Provider  furosemide (LASIX) 40 MG tablet Take 1 tablet (40 mg total) by mouth daily. 06/13/15  Yes Ruben ImJeremy Ford, MD  ipratropium-albuterol (DUONEB) 0.5-2.5 (3) MG/3ML SOLN Take 3 mLs by nebulization every 6 (six) hours as needed.   Yes Historical Provider, MD  isosorbide dinitrate (ISORDIL) 20 MG tablet Take 20 mg by mouth 2 (two) times daily.   Yes Historical Provider, MD  lidocaine (LIDODERM) 5 % Place 1 patch onto the skin daily. Remove & Discard patch within 12 hours or as directed by MD To right shoulder   Yes Historical Provider, MD  potassium chloride SA (K-DUR,KLOR-CON) 20 MEQ tablet Take 2 tablets (40 mEq total) by mouth 4 (four) times daily. 04/22/15  Yes Sharee Holstereborah S Green, NP  sertraline (ZOLOFT) 100 MG tablet Take 100 mg by mouth daily.   Yes Historical Provider, MD  warfarin (COUMADIN) 10 MG tablet Take 10 mg by mouth daily. 05/31/15  Yes Historical Provider, MD  clonazePAM (KLONOPIN) 0.5 MG tablet Take 0.5 mg by mouth 3 (three) times daily.    Historical Provider, MD  famotidine (PEPCID) 20 MG tablet Take 20 mg by mouth daily.    Historical Provider, MD  folic acid (FOLVITE) 1 MG tablet Take 1 mg by mouth daily.    Historical Provider, MD  hydrALAZINE (APRESOLINE) 25 MG tablet Take 37.5 mg by mouth 2 (two) times daily.    Historical Provider, MD  metoCLOPramide (REGLAN) 10 MG tablet Take 10 mg by mouth 4 (four) times daily -  before meals and at bedtime.    Historical Provider, MD  metolazone (ZAROXOLYN) 5 MG tablet Take 5 mg by mouth  daily.    Historical Provider, MD  Omega-3 Fatty Acids (FISH OIL) 1000 MG CAPS Take 1,000 mg by mouth daily.     Historical Provider, MD  ondansetron (ZOFRAN) 4 MG tablet Take 4 mg by mouth every 6 (six) hours.    Historical Provider, MD     Vital Signs: BP 117/79 mmHg  Pulse 85  Temp(Src) 98.6 F (37 C) (Oral)  Resp 20  Ht 6' (1.829 m)  Wt 272 lb (123.378 kg)  BMI 36.88 kg/m2  SpO2 95%  Physical Exam transgluteal drain intact, output 20 mL purulent beige colored fluid; drain fluid cultures with moderate gram-negative rods, blood cultures positive for Escherichia coli; site nontender  Imaging: Ct Abdomen Pelvis W Contrast  06/17/2015  CLINICAL DATA:  Nausea and vomiting. Back pain and swelling in legs. Possible urine stool incontinence. Morbid obesity. COPD. Congestive heart failure. EXAM: CT ABDOMEN AND PELVIS WITH CONTRAST TECHNIQUE: Multidetector CT imaging of the abdomen and pelvis was performed using the standard protocol following bolus administration of intravenous contrast. CONTRAST:  100mL OMNIPAQUE IOHEXOL 300 MG/ML  SOLN COMPARISON:  01/28/2015 FINDINGS: Lower chest: Motion degradation throughout. Grossly clear lung bases. Mild cardiomegaly. Multivessel coronary artery atherosclerosis. Hepatobiliary: Mild hepatic steatosis, without focal liver lesion. Normal gallbladder, without biliary ductal dilatation. Pancreas: Normal, without mass or ductal dilatation. Periampullary duodenal diverticulum. Spleen: Normal in size, without focal abnormality. Adrenals/Urinary Tract: Normal adrenal glands. Bilateral renal collecting system  calculi. Largest stone is in the interpolar left kidney at 11 mm. Too small to characterize interpolar left renal lesion. A lower pole right renal lesion measures 13 mm and is likely a cyst. Air within the left renal collecting system and left ureter proximally. There is edema along the course of the left ureter, including at the left pelvic brim on image/series  74/2. Air is identified within the urinary bladder. No hydroureter or ureteric stone seen. Stomach/Bowel: Mild gastric distension. Descending duodenal diverticulum. Normal terminal ileum and appendix. Small bowel loops are fluid-filled, measuring up to 3.7 cm. No transition point is identified. No pneumatosis or free intraperitoneal air. Colon is gas and fluid filled. Scattered colonic diverticula. No colonic wall thickening. Vascular/Lymphatic: Aortic and branch vessel atherosclerosis. Small retroperitoneal nodes are not pathologic by size criteria. IVC filter is appropriately positioned. No pelvic adenopathy. Reproductive: Multiloculated fluid collection or collections centered about the right-side of the prostate and pelvic cul-de-sac. In conglomerate, measure on the order of 5.5 x 5.9 cm on image/series 94/2. Other: No significant free fluid. Musculoskeletal: Degenerative partial fusion of the bilateral sacroiliac joints. IMPRESSION: 1. Peripherally enhancing fluid collection or collections centered about the right-side of the prostate and pelvic cul-de-sac. Suspicious for abscess/ abscesses. 2. Air within the urinary bladder and left renal collecting system/ureter. Suspicious for ascending infection. Edema in the left hemipelvis, along the course of the left ureter, is likely secondary. 3. Fluid-filled large and small bowel loops are suspicious for adynamic ileus. No focal transition point identified. 4. Bilateral nephrolithiasis. 5.  Atherosclerosis, including within the coronary arteries. 6. Mild motion degradation. These results were called by telephone at the time of interpretation on 06/17/2015 at 5:35 pm to PA. SHARI UPSTILL , who verbally acknowledged these results. Electronically Signed   By: Jeronimo Greaves M.D.   On: 06/17/2015 17:35   Dg Chest Port 1 View  06/17/2015  CLINICAL DATA:  Cough for 1 day. EXAM: PORTABLE CHEST 1 VIEW COMPARISON:  02/21/2015 and prior radiographs FINDINGS: Cardiomegaly and  mild pulmonary vascular congestion noted. An endotracheal tube is present with tip 7 cm above the carina. There is no evidence of focal airspace disease, pulmonary edema, suspicious pulmonary nodule/mass, pleural effusion, or pneumothorax. No acute bony abnormalities are identified. IMPRESSION: Cardiomegaly with mild pulmonary vascular congestion. Electronically Signed   By: Harmon Pier M.D.   On: 06/17/2015 19:32   Ct Image Guided Drainage Percut Cath  Peritoneal Retroperit  06/19/2015  CLINICAL DATA:  64 year old with a prostate abscess. Request for image guided drainage. EXAM: CT-GUIDED DRAINAGE OF PROSTATE ABSCESS Physician: Rachelle Hora. Lowella Dandy, MD FLUOROSCOPY TIME:  None MEDICATIONS: 2 mg Versed, 50 mcg fentanyl. A radiology nurse monitored the patient for moderate sedation. ANESTHESIA/SEDATION: Moderate sedation time: 20 minutes PROCEDURE: The procedure was explained to the patient. The risks and benefits of the procedure were discussed and the patient's questions were addressed. Informed consent was obtained from the patient. The patient was placed prone and CT images through the pelvis were obtained. The fluid collection along the right side of the prostate was targeted. Gluteal region was prepped with Betadine and a sterile field was created. Skin was anesthetized with 1% lidocaine. An 18 gauge needle was directed into the fluid collection along the right side of the prostate. Yellow purulent fluid was aspirated. A stiff Amplatz wire was advanced into the collection. The tract was dilated to accommodate a 10 [place] multipurpose drain. Approximately 30 mL of purulent fluid was removed. Catheter was attached to a  suction bulb and sutured in place. Dressing placed over the drain. FINDINGS: Fluid collection along the right side of the prostate compatible with an abscess. 30 mL of purulent fluid was removed. Estimated blood loss: Minimal COMPLICATIONS: None IMPRESSION: CT-guided drainage of the prostate abscess.  Electronically Signed   By: Richarda Overlie M.D.   On: 06/19/2015 18:04    Labs:  CBC:  Recent Labs  06/17/15 2137  06/18/15 1300 06/19/15 0422 06/20/15 0432 06/21/15 0500  WBC 12.6*  --   --  8.7 9.6 11.0*  HGB 12.6*  < > 12.2* 11.7* 11.6* 12.1*  HCT 38.4*  < > 38.0* 37.6* 36.7* 37.2*  PLT 298  --   --  325 335 373  < > = values in this interval not displayed.  COAGS:  Recent Labs  01/09/15 1510 01/28/15 2009  06/13/15 1551 06/17/15 1609 06/18/15 0721 06/19/15 0422  INR 1.07 1.22  < > 6.8 3.15* 2.29* 1.30  APTT 25 55*  --   --   --   --   --   < > = values in this interval not displayed.  BMP:  Recent Labs  06/17/15 1406 06/19/15 0422 06/20/15 0432 06/21/15 0500  NA 137 138 138 134*  K 3.3* 3.0* 3.0* 2.9*  CL 101 104 102 94*  CO2 GLUCOSE 137* 118* 113* 126*  BUN 24* CALCIUM 9.6 9.3 9.3 9.6  CREATININE 0.90 0.76 0.74 0.78  GFRNONAA >60 >60 >60 >60  GFRAA >60 >60 >60 >60    LIVER FUNCTION TESTS:  Recent Labs  01/02/15 0430  01/24/15 0342  01/30/15 0655 03/04/15 0555 03/07/15 0553 03/14/15 0552 06/17/15 1609  BILITOT 1.9*  --  1.4*  --  0.8  --   --   --  1.1  AST 45*  --  57*  --  45*  --   --   --  25  ALT 29  --  36  --  49  --   --   --  29  ALKPHOS 80  --  91  --  113  --   --   --  173*  PROT 7.2  --  6.1*  --  6.8  --   --   --  7.9  ALBUMIN 3.3*  < > 2.5*  < > 2.8* 3.4* 3.2* 3.2* 3.1*  < > = values in this interval not displayed.  Assessment and Plan: Pt s/p drainage of prostatic abscess 10/12; check final drain fluid  cx's; blood cultures with Escherichia coli; antibiotics per primary team ;afebrile; WBC 11.0, hemoglobin stable, creatinine normal, potassium 2.9- replace; check follow-up CT early next week or sooner if clinical status worsens.   Signed: D. Jeananne Rama 06/21/2015, 12:05 PM   I spent a total of 15 minutes at the the patient's bedside AND on the patient's hospital floor or unit, greater than 50%  of which was counseling/coordinating care for prostatic abscess drain

## 2015-06-21 NOTE — Progress Notes (Signed)
CSW met with Pt who is still agreeable to SNF placement. Pt requesting placement at [facility].  No family supports mentioned.  Peri Maris, [place] [facility]

## 2015-06-21 NOTE — Progress Notes (Signed)
70 cc  From drain over last 24 hrs No change in GU status Patient confused overnight  Filed Vitals:   06/20/15 2344 06/21/15 0224 06/21/15 0400 06/21/15 0508  BP: 140/90   117/79  Pulse:  83  79  Temp:    98.6 F (37 C)  TempSrc:    Oral  Resp:  18  20  Height:      Weight:    272 lb (123.378 kg)  SpO2: 93% 94% 95% 92%   I/O last 3 completed shifts: In: 1305 [P.O.:480; Other:25; IV Piggyback:800] Out: 4495 [Urine:4425; Drains:70]   NAD Soft NT ND No foley  CBC    Component Value Date/Time   WBC 11.0* 06/21/2015 0500   WBC 9.3 11/29/2014 1439   RBC 4.37 06/21/2015 0500   RBC 5.77 11/29/2014 1439   HGB 12.1* 06/21/2015 0500   HGB 18.0 11/29/2014 1439   HCT 37.2* 06/21/2015 0500   HCT 32.3* 01/31/2015 0322   HCT 56.5* 11/29/2014 1439   PLT 373 06/21/2015 0500   MCV 85.1 06/21/2015 0500   MCV 98.0* 11/29/2014 1439   MCH 27.7 06/21/2015 0500   MCH 31.2 11/29/2014 1439   MCHC 32.5 06/21/2015 0500   MCHC 31.9 11/29/2014 1439   RDW 15.4 06/21/2015 0500   LYMPHSABS 1.1 06/17/2015 2137   MONOABS 0.9 06/17/2015 2137   EOSABS 0.1 06/17/2015 2137   BASOSABS 0.0 06/17/2015 2137    BMP Latest Ref Rng 06/21/2015 06/20/2015 06/19/2015  Glucose 65 - 99 mg/dL 657(Q126(H) 469(G113(H) 295(M118(H)  BUN 6 - 20 mg/dL 14 16 19   Creatinine 0.61 - 1.24 mg/dL 8.410.78 3.240.74 4.010.76  BUN/Creat Ratio 10 - 22 - - -  Sodium 135 - 145 mmol/L 134(L) 138 138  Potassium 3.5 - 5.1 mmol/L 2.9(L) 3.0(L) 3.0(L)  Chloride 101 - 111 mmol/L 94(L) 102 104  CO2 22 - 32 mmol/L 31 25 24   Calcium 8.9 - 10.3 mg/dL 9.6 9.3 9.3    Urine Culture: E. Coli Blood culture: GNR Drain Culture: GNR  1. Prostatic abscess/sepsis -continue abscess drain until output is minimal. Repeat CT prior to removal -Monitor vitals, WBC -Monitor Cr, UOP, drain output -continue borad spectrum abx pending c/s results  2. Air in the left collecting system and bladder -see above  3. Nonobstructing nephrolithiasis -No further workup this  time. This can be addressed in the nonacute setting.

## 2015-06-21 NOTE — Evaluation (Signed)
Physical Therapy Evaluation Patient Details Name: [name] MRN: [id number] DOB: [birth year] Today's Date: 06/21/2015   History of Present Illness  Pt is a 64 year old male s/p drainage of prostatic abscess 10/12 and transgluteal drain placed.  He has a h/o respiratory failure/trach, chronic DVT, HTN, COPD, CHF  Clinical Impression  Pt admitted with above diagnosis. Pt currently with functional limitations due to the deficits listed below (see PT Problem List).  Pt will benefit from skilled PT to increase their independence and safety with mobility to allow discharge to the venue listed below.   Pt premedicated for session and remains very limited by back pain however encouraged to stand at EOB today.  Pt with soiled linen so performed multiple rolling for pericare (RN changed drain dressing) and changing bed linen.  Pt then assisted to sitting.  Pt reluctant to stand but agreeable with use of SW, and RN pulled bed out so pt could sit down further up bed to assist with repositioning once supine again.  Pt was unable to tolerate standing (<30 seconds).  Pt lives alone and will likely require increased care upon d/c.     Follow Up Recommendations SNF;Supervision/Assistance - 24 hour    Equipment Recommendations  None recommended by PT    Recommendations for Other Services       Precautions / Restrictions Precautions Precautions: Fall Precaution Comments: trach, back pain      Mobility  Bed Mobility Overal bed mobility: Needs Assistance Bed Mobility: Rolling;Sidelying to Sit;Sit to Supine Rolling: Min assist Sidelying to sit: Mod assist   Sit to supine: Mod assist   General bed mobility comments: verbal cues for self assist including bending LEs and placing weight through feet to assist with positioning/transfers as well as rails  Transfers Overall transfer level: Needs assistance Equipment used: Standard walker Transfers: Sit to/from Stand Sit to Stand: Min assist;+2  safety/equipment;From elevated surface         General transfer comment: bed elevated, pt only required min assist to rise and steady, pt declined further mobility  Ambulation/Gait                Stairs            Wheelchair Mobility    Modified Rankin (Stroke Patients Only)       Balance Overall balance assessment: Needs assistance Sitting-balance support: Feet supported;No upper extremity supported Sitting balance-Leahy Scale: Fair     Standing balance support: Bilateral upper extremity supported;During functional activity Standing balance-Leahy Scale: Poor Standing balance comment: requires UE support                             Pertinent Vitals/Pain Pain Assessment: 0-10 Pain Score: 10-Worst pain ever Pain Location: back with movement Pain Descriptors / Indicators: Aching Pain Intervention(s): Limited activity within patient's tolerance;Monitored during session;Premedicated before session;Repositioned    Home Living Family/patient expects to be discharged to:: Private residence Living Arrangements: Alone             Home Equipment: Walker - 2 wheels Additional Comments: pt states that he did what he could and didn't eat sometimes.  Neighbor, [name], got groceries    Prior Function Level of Independence: Independent with assistive device(s)               Hand Dominance        Extremity/Trunk Assessment  Lower Extremity Assessment: Generalized weakness (able to move against gravity, body habitus limiting AROM)         Communication   Communication: Passy-Muir valve  Cognition Arousal/Alertness: Awake/alert Behavior During Therapy: WFL for tasks assessed/performed Overall Cognitive Status: Within Functional Limits for tasks assessed                      General Comments      Exercises        Assessment/Plan    PT Assessment Patient needs continued PT services  PT Diagnosis  Difficulty walking;Generalized weakness   PT Problem List Decreased strength;Decreased activity tolerance;Decreased mobility;Pain;Decreased balance;Obesity  PT Treatment Interventions DME instruction;Gait training;Functional mobility training;Patient/family education;Therapeutic activities;Therapeutic exercise;Balance training   PT Goals ([place] goals can be found in the Care Plan section) Acute Rehab PT Goals PT Goal Formulation: With patient Time For Goal Achievement: 06/28/15 Potential to Achieve Goals: Good    Frequency Min 3X/week   Barriers to discharge        Co-evaluation PT/OT/SLP Co-Evaluation/Treatment: Yes Reason for Co-Treatment: For patient/therapist safety PT goals addressed during session: Mobility/safety with mobility OT goals addressed during session: ADL's and self-care       End of Session   Activity Tolerance: Patient limited by pain Patient left: in bed;with call bell/phone within reach Nurse Communication: Mobility status         Time: 1610-9604 PT Time Calculation (min) (ACUTE ONLY): 37 min   Charges:   PT Evaluation $Initial PT Evaluation Tier I: 1 Procedure     PT G Codes:        [name],KATHrine E 06/21/2015, 12:45 PM Zenovia Jarred, PT, DPT 06/21/2015 Pager: [phone]

## 2015-06-22 DIAGNOSIS — I82501 Chronic embolism and thrombosis of unspecified deep veins of right lower extremity: Secondary | ICD-10-CM

## 2015-06-22 LAB — PROTIME-INR
INR: 1.47 (ref 0.00–1.49)
PROTHROMBIN TIME: 17.9 s — AB (ref 11.6–15.2)

## 2015-06-22 LAB — CULTURE, ROUTINE-ABSCESS

## 2015-06-22 LAB — BASIC METABOLIC PANEL
Anion gap: 13 (ref 5–15)
BUN: 19 mg/dL (ref 6–20)
CHLORIDE: 90 mmol/L — AB (ref 101–111)
CO2: 30 mmol/L (ref 22–32)
CREATININE: 0.95 mg/dL (ref 0.61–1.24)
Calcium: 9.6 mg/dL (ref 8.9–10.3)
GFR calc Af Amer: >60 mL/min (ref >60)
GFR calc non Af Amer: >60 mL/min (ref >60)
Glucose, Bld: 114 mg/dL — ABNORMAL HIGH (ref 65–99)
Potassium: 3.4 mmol/L — ABNORMAL LOW (ref 3.5–5.1)
Sodium: 133 mmol/L — ABNORMAL LOW (ref 135–145)

## 2015-06-22 LAB — CBC
HCT: 40.3 % (ref 39.0–52.0)
Hemoglobin: 13.1 g/dL (ref 13.0–17.0)
MCH: 27.8 pg (ref 26.0–34.0)
MCHC: 32.5 g/dL (ref 30.0–36.0)
MCV: 85.4 fL (ref 78.0–100.0)
PLATELETS: 420 10*3/uL — AB (ref 150–400)
RBC: 4.72 MIL/uL (ref 4.22–5.81)
RDW: 15.5 % (ref 11.5–15.5)
WBC: 11.7 10*3/uL — AB (ref 4.0–10.5)

## 2015-06-22 MED ORDER — POTASSIUM CHLORIDE CRYS ER 20 MEQ PO TBCR
40.0000 meq | EXTENDED_RELEASE_TABLET | Freq: Once | ORAL | Status: AC
  Administered 2015-06-22: 40 meq via ORAL
  Filled 2015-06-22: qty 2

## 2015-06-22 MED ORDER — WARFARIN SODIUM 2.5 MG PO TABS
2.5000 mg | ORAL_TABLET | Freq: Once | ORAL | Status: AC
  Administered 2015-06-22: 2.5 mg via ORAL
  Filled 2015-06-22: qty 1

## 2015-06-22 MED ORDER — WARFARIN - PHARMACIST DOSING INPATIENT: Freq: Every day | Status: DC

## 2015-06-22 NOTE — Clinical Social Work Note (Signed)
SNF referrals made. CSW checked in with patient today. Patient still plans to go to SNF at discharge.  Roddie McBryant Timithy Arons MSW, [place]CSW, [place], [phone]

## 2015-06-22 NOTE — Progress Notes (Addendum)
Patient ID: [name], male   DOB: 1951/02/07, 64 y.o.   MRN: [id number]  [facility] PROGRESS NOTE  [name] HQP:[id number] DOB: June 11, 1951 DOA: 06/17/2015 PCP: Liberty Handy, MD   Brief narrative:     Patient with history of chronic DVT s/p IVC filter on coumadin, COPD, diastolic HF, HTN with recent admission with respiratory failure s/p trach who presented with LLQ pain : started 4 days PTA, getting worse, no fever or chills, with diarrhea, no vomiting or nausea, with brownish urine and dysuria. He also has had increased brownish secretions from his trach but no dyspnea or chest pain.LL edema has been worsening.INR has been supra-therapeutic: he was told to hold Coumadin last Thursday as INR was 6.8 in clinic. No other complaints.   Assessment/Plan:    Principal Problem:   Abscess, prostate - per urologist, pt is now s/p drain of prostate abscess, post op day #4 - cultures from abscess with g-rods  - urine and blood cultures positive for E. Coli, pan sensitive, changed to Rocephin 10/14, continue Rocephin  - WBC still up, monitor closely  - Monitor Cr, UOP  Active Problems:   Sepsis due to E. Coli UTI, bacteremia, prostatic abscess  - please note that pt met criteria for sepsis on admission with HR > 80, WBC > 12.3, ? source UTI, prostate abscess  - blood and urine culture positive for E. Coli, continue Rocephin as noted above  - repeat blood cultures 10/13 --> no growth to date  - CBC in AM    Chronic deep vein thrombosis (DVT) (HCC) - on Coumadin at home but has been on hold for now until abscess drain done - per pharmacy     Obesity hypoventilation syndrome ([place]) - trach collar in place - more gargles this AM, CXR requested     Hypokalemia - still low  - continue to supplement and repeat BMP in AM    BMI 50.0-59.9, adult (HCC) - Body mass index is 41.9 kg/(m^2)    Tobacco abuse - cessation consultation provided     Tracheostomy status ([place]) - monitor  oxygen saturation  - pt has refused trach maintenance and has refused to sit up as recommended so this is most likely the reason for gargles this AM - follow up CXR    Essential hypertension, benign - reasonable inpatient control     Chronic diastolic congestive heart failure ([place]) - weight 123 kg this AM, was 138 kg on admission - continue home regimen with Lasix  - monitor daily weights, strict I/O  DVT prophylaxis - SCD's  Code Status: Full.  Family Communication:  plan of care discussed with the patient Disposition Plan: SNF on Monday   IV access:  Peripheral IV  Procedures and diagnostic studies:    Ct Abdomen Pelvis W Contrast 06/17/2015  Peripherally enhancing fluid collection or collections centered about the right-side of the prostate and pelvic cul-de-sac. Suspicious for abscess/ abscesses. 2. Air within the urinary bladder and left renal collecting system/ureter. Suspicious for ascending infection. Edema in the left hemipelvis, along the course of the left ureter, is likely secondary. 3. Fluid-filled large and small bowel loops are suspicious for adynamic ileus. No focal transition point identified. 4. Bilateral nephrolithiasis. 5.  Atherosclerosis, including within the coronary arteries. 6. Mild motion degradation.   Dg Chest Port 1 View 06/17/2015  Cardiomegaly with mild pulmonary vascular congestion.   Medical Consultants:  Urology   Other Consultants:  None  IAnti-Infectives:   Vancomycin 10/11 -->  10/14 Zosyn 10/11 --> 10/14 Rocephin 10/14 -->  Faye Ramsay, MD  [facility] Pager [phone]   If 7PM-7AM, please contact night-coverage [email or web address] Password [email or web address] 06/22/2015, 7:51 AM   LOS: 5 days   HPI/Subjective: No events overnight.   Objective: Filed Vitals:   06/21/15 2100 06/22/15 0057 06/22/15 0300 06/22/15 0459  BP:    136/80  Pulse:  85 78 86  Temp:    98.5 F (36.9 C)  TempSrc:    Oral  Resp:  $Remo'18 18 18  'dfeCz$ Height:      Weight:    122.925  kg (271 lb)  SpO2: 95% 93%  90%    Intake/Output Summary (Last 24 hours) at 06/22/15 0751 Last data filed at 06/22/15 0701  Gross per 24 hour  Intake    570 ml  Output   1235 ml  Net   -665 ml    Exam:   General:  Pt is alert, follows commands appropriately, not in acute distress, morbidly obese   Cardiovascular: Regular rate and rhythm, S1/S2, no murmurs, no rubs, no gallops  Respiratory: Clear to auscultation bilaterally, diminished breath sounds at bases with upper resp gargles   Abdomen: Soft, non tender, slightly distended, bowel sounds present but faint, no guarding  Data Reviewed: Basic Metabolic Panel:  Recent Labs Lab 06/17/15 1406 06/19/15 0422 06/20/15 0432 06/21/15 0500 06/22/15 0415  NA 137 138 138 134* 133*  K 3.3* 3.0* 3.0* 2.9* 3.4*  CL 101 104 102 94* 90*  CO2 $Re'26 24 25 31 30  'Alb$ GLUCOSE 137* 118* 113* 126* 114*  BUN 24* $Remov'19 16 14 19  'SmYBBC$ CREATININE 0.90 0.76 0.74 0.78 0.95  CALCIUM 9.6 9.3 9.3 9.6 9.6   Liver Function Tests:  Recent Labs Lab 06/17/15 1609  AST 25  ALT 29  ALKPHOS 173*  BILITOT 1.1  PROT 7.9  ALBUMIN 3.1*   CBC:  Recent Labs Lab 06/17/15 1406 06/17/15 2137  06/18/15 1300 06/19/15 0422 06/20/15 0432 06/21/15 0500 06/22/15 0415  WBC 12.6* 12.6*  --   --  8.7 9.6 11.0* 11.7*  NEUTROABS 11.1* 10.5*  --   --   --   --   --   --   HGB 13.6 12.6*  < > 12.2* 11.7* 11.6* 12.1* 13.1  HCT 41.0 38.4*  < > 38.0* 37.6* 36.7* 37.2* 40.3  MCV 86.1 87.5  --   --  88.3 87.4 85.1 85.4  PLT 282 298  --   --  325 335 373 420*  < > = values in this interval not displayed. Cardiac Enzymes:  Recent Labs Lab 06/17/15 1609  CKTOTAL 79   Recent Results (from the past 240 hour(s))  Culture, Urine     Status: None   Collection Time: 06/17/15  9:04 PM  Result Value Ref Range Status   Specimen Description URINE, CLEAN CATCH  Final   Special Requests NONE  Final   Culture   Final    >=100,000 COLONIES/mL ESCHERICHIA COLI Performed at  [facility]    Report Status 06/20/2015 FINAL  Final   Organism ID, Bacteria ESCHERICHIA COLI  Final      Susceptibility   Escherichia coli - MIC*    AMPICILLIN <=2 SENSITIVE Sensitive     CEFAZOLIN <=4 SENSITIVE Sensitive     CEFTRIAXONE <=1 SENSITIVE Sensitive     CIPROFLOXACIN <=0.25 SENSITIVE Sensitive     GENTAMICIN <=1 SENSITIVE Sensitive     IMIPENEM <=0.25 SENSITIVE Sensitive     NITROFURANTOIN <=  16 SENSITIVE Sensitive     TRIMETH/SULFA <=20 SENSITIVE Sensitive     AMPICILLIN/SULBACTAM <=2 SENSITIVE Sensitive     PIP/TAZO <=4 SENSITIVE Sensitive     * >=100,000 COLONIES/mL ESCHERICHIA COLI  Culture, blood (routine x 2)     Status: None (Preliminary result)   Collection Time: 06/17/15  9:24 PM  Result Value Ref Range Status   Specimen Description BLOOD LEFT ARM  Final   Special Requests BOTTLES DRAWN AEROBIC AND ANAEROBIC 10 CC  Final   Culture   Final    NO GROWTH 3 DAYS Performed at [facility]    Report Status PENDING  Incomplete  Culture, blood (routine x 2)     Status: None   Collection Time: 06/17/15  9:37 PM  Result Value Ref Range Status   Specimen Description BLOOD BLOOD LEFT FOREARM  Final   Special Requests IN PEDIATRIC BOTTLE 4 CC  Final   Culture  Setup Time   Final    GRAM NEGATIVE RODS IN PEDIATRIC BOTTLE CRITICAL RESULT CALLED TO, READ BACK BY AND VERIFIED WITH: SOPHIA $RemoveBefo'[email or web address]'[email or web address]$  06/19/15 MKELLY    Culture   Final    ESCHERICHIA COLI Performed at [facility]    Report Status 06/21/2015 FINAL  Final   Organism ID, Bacteria ESCHERICHIA COLI  Final      Susceptibility   Escherichia coli - MIC*    AMPICILLIN <=2 SENSITIVE Sensitive     CEFAZOLIN <=4 SENSITIVE Sensitive     CEFEPIME <=1 SENSITIVE Sensitive     CEFTAZIDIME <=1 SENSITIVE Sensitive     CEFTRIAXONE <=1 SENSITIVE Sensitive     CIPROFLOXACIN <=0.25 SENSITIVE Sensitive     GENTAMICIN <=1 SENSITIVE Sensitive     IMIPENEM <=0.25 SENSITIVE Sensitive     TRIMETH/SULFA  <=20 SENSITIVE Sensitive     AMPICILLIN/SULBACTAM <=2 SENSITIVE Sensitive     * ESCHERICHIA COLI  Culture, routine-abscess     Status: None (Preliminary result)   Collection Time: 06/19/15  5:11 PM  Result Value Ref Range Status   Specimen Description PROSTATE  Final   Special Requests NONE  Final   Gram Stain   Final    ABUNDANT WBC PRESENT,BOTH PMN AND MONONUCLEAR NO SQUAMOUS EPITHELIAL CELLS SEEN RARE GRAM NEGATIVE RODS Performed at [facility]    Culture   Final    MODERATE GRAM NEGATIVE RODS Performed at [facility]    Report Status PENDING  Incomplete  Culture, blood (routine x 2)     Status: None (Preliminary result)   Collection Time: 06/20/15  1:55 PM  Result Value Ref Range Status   Specimen Description BLOOD LEFT WRIST  Final   Special Requests BOTTLES DRAWN AEROBIC AND ANAEROBIC 10CC EACH  Final   Culture   Final    NO GROWTH < 24 HOURS Performed at [facility]    Report Status PENDING  Incomplete  Culture, blood (routine x 2)     Status: None (Preliminary result)   Collection Time: 06/20/15  2:05 PM  Result Value Ref Range Status   Specimen Description BLOOD LEFT ARM  Final   Special Requests BOTTLES DRAWN AEROBIC AND ANAEROBIC 10CC EACH  Final   Culture   Final    NO GROWTH < 24 HOURS Performed at [facility]    Report Status PENDING  Incomplete   Scheduled Meds: . cefTRIAXone (ROCEPHIN)  IV  2 g Intravenous Q24H  . famotidine  20 mg Oral Daily  . furosemide  40 mg Oral Daily  . hydrALAZINE  25 mg Oral BID  . isosorbide dinitrate  20 mg Oral BID  . metoCLOPramide  10 mg Oral TID AC & HS  . metolazone  5 mg Oral Daily  . pantoprazole  40 mg Oral BID  . potassium chloride SA  40 mEq Oral Daily  . potassium chloride  40 mEq Oral Once  . sertraline  100 mg Oral Daily

## 2015-06-22 NOTE — Progress Notes (Signed)
ANTICOAGULATION CONSULT NOTE - Initial Consult  Pharmacy Consult for Warfarin Indication: Hx DVT  No Known Allergies  Patient Measurements: Height: 6' (182.9 cm) Weight: 271 lb (122.925 kg) IBW/kg (Calculated) : 77.6  Vital Signs: Temp: 98 F (36.7 C) (10/15 1500) Temp Source: Oral (10/15 1500) BP: 144/77 mmHg (10/15 1500) Pulse Rate: 89 (10/15 1532)  Labs:  Recent Labs  06/20/15 0432 06/21/15 0500 06/22/15 0415  HGB 11.6* 12.1* 13.1  HCT 36.7* 37.2* 40.3  PLT 335 373 420*  CREATININE 0.74 0.78 0.95    Estimated Creatinine Clearance: 109.1 mL/min (by C-G formula based on Cr of 0.95).  Assessment: [age] yoM presented 10/10 with LLQ pain, abd abscess near prostate with air in bladder and required drainage of abscess on 10/12.  PMH includes chronic warfarin for history of DVT.  Warfarin was held on admission, and reversed with Vit K PO on 10/10-10/11 prior to drainage.  Pharmacy is now consulted to resume warfarin dosing. Home warfarin dose was 10mg  daily, but it was held since 10/6 due to elevated INR 6.8 before admission.  Today, 06/22/2015: INR increase to 1.47 from 1.3 (last on 10/12) despite no warfarin doses. CBC: Hgb and Plt remain WNL Drain output continues, no bleeding or complications per RN. Diet: Regular, eating ~50% of meals Drug-drug interactions: cephalosporins may enhance anticoag effects of warfarin  Goal of Therapy:  INR 2-3 Monitor platelets by anticoagulation protocol: Yes   Plan:   Warfarin 2.5mg  PO tonight at 2000  Daily INR  Lynann Beaverhristine Iria Jamerson PharmD, BCPS Pager [phone] 06/22/2015 4:36 PM

## 2015-06-22 NOTE — Clinical Social Work Placement (Addendum)
   CLINICAL SOCIAL WORK PLACEMENT  NOTE  Date:  06/22/2015  Patient Details  Name: Fayrene FearingJames Razavi MRN: [id number] Date of Birth: 24-Aug-1951  Clinical Social Work is seeking post-discharge placement for this patient at the Skilled  Nursing Facility level of care (*CSW will initial, date and re-position this form in  chart as items are completed):  Yes   Patient/family provided with [facility] Clinical Social Work Department's list of facilities offering this level of care within the geographic area requested by the patient (or if unable, by the patient's family).  Yes   Patient/family informed of their freedom to choose among providers that offer the needed level of care, that participate in Medicare, Medicaid or managed care program needed by the patient, have an available bed and are willing to accept the patient.  Yes   Patient/family informed of [facility]'s ownership interest in [facility] and [facility], as well as of the fact that they are under no obligation to receive care at these facilities.  PASRR submitted to EDS on       PASRR number received on       Existing PASRR number confirmed on 06/22/15     FL2 transmitted to all facilities in geographic area requested by pt/family on 06/22/15     FL2 transmitted to all facilities within larger geographic area on       Patient informed that his/her managed care company has contracts with or will negotiate with certain facilities, including the following:          06/24/2015   Patient/family informed of bed offers received.  [facility]    Physician recommends and patient chooses bed at      [facility]   [facility] on  .06/26/2015   Patient to be transferred to facility by  EMS   Patient family notified on   of transfer. Patient declines.  Name of family member notified:        PHYSICIAN       Additional Comment:    Reece LevyJanet Adelheid Hoggard,  MSW, Theresia MajorsLCSWA [phone].

## 2015-06-22 NOTE — Progress Notes (Signed)
E coli pan sensitive in prostate Blood c/s normal Advancing diet some Feels overall better Drain output 135

## 2015-06-23 DIAGNOSIS — N412 Abscess of prostate: Secondary | ICD-10-CM | POA: Diagnosis present

## 2015-06-23 DIAGNOSIS — R0600 Dyspnea, unspecified: Secondary | ICD-10-CM

## 2015-06-23 LAB — PROTIME-INR
INR: 1.61 — ABNORMAL HIGH (ref 0.00–1.49)
Prothrombin Time: 19.2 seconds — ABNORMAL HIGH (ref 11.6–15.2)

## 2015-06-23 LAB — BASIC METABOLIC PANEL
Anion gap: 13 (ref 5–15)
BUN: 28 mg/dL — AB (ref 6–20)
CALCIUM: 10.2 mg/dL (ref 8.9–10.3)
CO2: 31 mmol/L (ref 22–32)
CREATININE: 1.23 mg/dL (ref 0.61–1.24)
Chloride: 93 mmol/L — ABNORMAL LOW (ref 101–111)
GFR calc Af Amer: >60 mL/min (ref >60)
GLUCOSE: 126 mg/dL — AB (ref 65–99)
Potassium: 3.2 mmol/L — ABNORMAL LOW (ref 3.5–5.1)
Sodium: 137 mmol/L (ref 135–145)

## 2015-06-23 LAB — CBC
HCT: 41.4 % (ref 39.0–52.0)
Hemoglobin: 13.4 g/dL (ref 13.0–17.0)
MCH: 28 pg (ref 26.0–34.0)
MCHC: 32.4 g/dL (ref 30.0–36.0)
MCV: 86.6 fL (ref 78.0–100.0)
PLATELETS: 414 10*3/uL — AB (ref 150–400)
RBC: 4.78 MIL/uL (ref 4.22–5.81)
RDW: 15.7 % — AB (ref 11.5–15.5)
WBC: 11.8 10*3/uL — ABNORMAL HIGH (ref 4.0–10.5)

## 2015-06-23 LAB — CULTURE, BLOOD (ROUTINE X 2): CULTURE: NO GROWTH

## 2015-06-23 MED ORDER — CIPROFLOXACIN HCL 500 MG PO TABS: 500.0000 mg | ORAL_TABLET | Freq: Two times a day (BID) | ORAL | Status: DC

## 2015-06-23 MED ORDER — POTASSIUM CHLORIDE CRYS ER 20 MEQ PO TBCR
40.0000 meq | EXTENDED_RELEASE_TABLET | Freq: Once | ORAL | Status: AC
  Administered 2015-06-23: 40 meq via ORAL
  Filled 2015-06-23: qty 2

## 2015-06-23 MED ORDER — CLONAZEPAM 0.5 MG PO TABS: 0.5000 mg | ORAL_TABLET | Freq: Three times a day (TID) | ORAL | Status: DC

## 2015-06-23 MED ORDER — WARFARIN SODIUM 2.5 MG PO TABS
2.5000 mg | ORAL_TABLET | Freq: Once | ORAL | Status: AC
  Administered 2015-06-23: 2.5 mg via ORAL
  Filled 2015-06-23: qty 1

## 2015-06-23 NOTE — Progress Notes (Signed)
ANTICOAGULATION CONSULT NOTE - Follow up  Pharmacy Consult for Warfarin Indication: Hx DVT  No Known Allergies  Patient Measurements: Height: 6' (182.9 cm) Weight: 288 lb (130.636 kg) IBW/kg (Calculated) : 77.6  Vital Signs: Temp: 98 F (36.7 C) (10/16 0555) Temp Source: Oral (10/16 0555) BP: 141/80 mmHg (10/16 0555) Pulse Rate: 94 (10/16 0818)  Labs:  Recent Labs  06/21/15 0500 06/22/15 0415 06/22/15 1725 06/23/15 0517  HGB 12.1* 13.1  --  13.4  HCT 37.2* 40.3  --  41.4  PLT 373 420*  --  414*  LABPROT  --   --  17.9* 19.2*  INR  --   --  1.47 1.61*  CREATININE 0.78 0.95  --  1.23    Estimated Creatinine Clearance: 87 mL/min (by C-G formula based on Cr of 1.23).  Assessment: [age] yoM presented 10/10 with LLQ pain, abd abscess near prostate with air in bladder and required drainage of abscess on 10/12.  PMH includes chronic warfarin for history of DVT.  Warfarin was held on admission, and reversed with Vit K PO on 10/10-10/11 prior to drainage.  Pharmacy is now consulted to resume warfarin dosing. Home warfarin dose was 10mg  daily, but it was held since 10/6 due to elevated INR 6.8 before admission.  Today, 06/23/2015: INR increasing after resuming last night. CBC: Hgb and Plt remain WNL Drain output continues, no bleeding or complications per RN. Diet: Regular, eating ~50% of meals Drug-drug interactions: cephalosporins may enhance anticoag effects of warfarin Note patient found on the floor in his room last night. No apparent injuries.  Goal of Therapy:  INR 2-3 Monitor platelets by anticoagulation protocol: Yes   Plan:  Repeat conservative warfarin 2.5mg  PO tonight at 1800. Daily INR.  Charolotte Ekeom Adler Alton, PharmD, pager [phone]. 06/23/2015,9:29 AM.

## 2015-06-23 NOTE — Discharge Instructions (Signed)

## 2015-06-23 NOTE — Progress Notes (Signed)
Patient ID: [name], male   DOB: [birth year], 64 y.o.   MRN: [id number]  [facility] PROGRESS NOTE  Butch Plumb RAX:[id number] DOB: 1951/03/29 DOA: 06/17/2015 PCP: Liberty Handy, MD   Brief narrative:     Patient with history of chronic DVT s/p IVC filter on coumadin, COPD, diastolic HF, HTN with recent admission with respiratory failure s/p trach who presented with LLQ pain : started 4 days PTA, getting worse, no fever or chills, with diarrhea, no vomiting or nausea, with brownish urine and dysuria. He also has had increased brownish secretions from his trach but no dyspnea or chest pain.LL edema has been worsening.INR has been supra-therapeutic: he was told to hold Coumadin last Thursday as INR was 6.8 in clinic. No other complaints.   Assessment/Plan:    Principal Problem:   Abscess, prostate - per urologist, pt is now s/p drain of prostate abscess, post op day #5 - cultures from abscess with E. Coli  - urine and blood cultures positive for E. Coli, pan sensitive, changed to Rocephin 10/14, continue Rocephin  - WBC still up, monitor closely  - Monitor Cr, UOP  Active Problems:   Sepsis due to E. Coli UTI, bacteremia, prostatic abscess  - please note that pt met criteria for sepsis on admission with HR > 80, WBC > 12.3, ? source UTI, prostate abscess  - blood and urine culture positive for E. Coli, continue Rocephin as noted above  - repeat blood cultures 10/13 --> no growth to date  - CBC in AM      ? Fall overnight - unclear if pt fell, but per report found on floor - monitor with fall precautions     Chronic deep vein thrombosis (DVT) ([place]) - on Coumadin at home but has been on hold for now until abscess drain done - per pharmacy     Obesity hypoventilation syndrome ([place]) - trach collar in place - more gargles this AM, CXR requested     Hypokalemia - still low  - continue to supplement and repeat BMP in AM    BMI 50.0-59.9, adult ([facility]CC) - Body mass index is  41.9 kg/(m^2)    Tobacco abuse - cessation consultation provided     Tracheostomy status ([place]) - monitor oxygen saturation  - pt has refused trach maintenance and has refused to sit up as recommended so this is most likely the reason for gargles this AM - follow up CXR    Essential hypertension, benign - reasonable inpatient control     Chronic diastolic congestive heart failure ([place]) - weight 123 kg this AM, was 138 kg on admission - continue home regimen with Lasix  - monitor daily weights, strict I/O  DVT prophylaxis - SCD's  Code Status: Full.  Family Communication:  plan of care discussed with the patient Disposition Plan: SNF on Monday   IV access:  Peripheral IV  Procedures and diagnostic studies:    Ct Abdomen Pelvis W Contrast 06/17/2015  Peripherally enhancing fluid collection or collections centered about the right-side of the prostate and pelvic cul-de-sac. Suspicious for abscess/ abscesses. 2. Air within the urinary bladder and left renal collecting system/ureter. Suspicious for ascending infection. Edema in the left hemipelvis, along the course of the left ureter, is likely secondary. 3. Fluid-filled large and small bowel loops are suspicious for adynamic ileus. No focal transition point identified. 4. Bilateral nephrolithiasis. 5.  Atherosclerosis, including within the coronary arteries. 6. Mild motion degradation.   Dg Chest Port 1 View 06/17/2015  Cardiomegaly with mild pulmonary vascular congestion.   Medical Consultants:  Urology   Other Consultants:  None  IAnti-Infectives:   Vancomycin 10/11 --> 10/14 Zosyn 10/11 --> 10/14 Rocephin 10/14 -->  Faye Ramsay, MD  [facility] Pager [phone]   If 7PM-7AM, please contact night-coverage [email or web address] Password [email or web address] 06/23/2015, 1:08 PM   LOS: 6 days   HPI/Subjective: ? Fall overnight   Objective: Filed Vitals:   06/23/15 0555 06/23/15 0818 06/23/15 1055 06/23/15 1140  BP: 141/80  126/75    Pulse: 92 94 77 88  Temp: 98 F (36.7 C)     TempSrc: Oral     Resp: $Remo'20 22  20  'bnvTd$ Height:      Weight:      SpO2: 95% 96%  97%    Intake/Output Summary (Last 24 hours) at 06/23/15 1308 Last data filed at 06/23/15 1301  Gross per 24 hour  Intake     15 ml  Output   1695 ml  Net  -1680 ml    Exam:   General:  Pt is alert, follows commands appropriately, not in acute distress, morbidly obese   Cardiovascular: Regular rate and rhythm, S1/S2, no murmurs, no rubs, no gallops  Respiratory: Clear to auscultation bilaterally, diminished breath sounds at bases with upper resp gargles   Abdomen: Soft, non tender, slightly distended, bowel sounds present but faint, no guarding  Data Reviewed: Basic Metabolic Panel:  Recent Labs Lab 06/19/15 0422 06/20/15 0432 06/21/15 0500 06/22/15 0415 06/23/15 0517  NA 138 138 134* 133* 137  K 3.0* 3.0* 2.9* 3.4* 3.2*  CL 104 102 94* 90* 93*  CO2 $Re'24 25 31 30 31  'djQ$ GLUCOSE 118* 113* 126* 114* 126*  BUN $Re'19 16 14 19 'Djj$ 28*  CREATININE 0.76 0.74 0.78 0.95 1.23  CALCIUM 9.3 9.3 9.6 9.6 10.2   Liver Function Tests:  Recent Labs Lab 06/17/15 1609  AST 25  ALT 29  ALKPHOS 173*  BILITOT 1.1  PROT 7.9  ALBUMIN 3.1*   CBC:  Recent Labs Lab 06/17/15 1406 06/17/15 2137  06/19/15 0422 06/20/15 0432 06/21/15 0500 06/22/15 0415 06/23/15 0517  WBC 12.6* 12.6*  --  8.7 9.6 11.0* 11.7* 11.8*  NEUTROABS 11.1* 10.5*  --   --   --   --   --   --   HGB 13.6 12.6*  < > 11.7* 11.6* 12.1* 13.1 13.4  HCT 41.0 38.4*  < > 37.6* 36.7* 37.2* 40.3 41.4  MCV 86.1 87.5  --  88.3 87.4 85.1 85.4 86.6  PLT 282 298  --  325 335 373 420* 414*  < > = values in this interval not displayed. Cardiac Enzymes:  Recent Labs Lab 06/17/15 1609  CKTOTAL 79   Recent Results (from the past 240 hour(s))  Culture, Urine     Status: None   Collection Time: 06/17/15  9:04 PM  Result Value Ref Range Status   Specimen Description URINE, CLEAN CATCH  Final   Special  Requests NONE  Final   Culture   Final    >=100,000 COLONIES/mL ESCHERICHIA COLI Performed at [facility]    Report Status 06/20/2015 FINAL  Final   Organism ID, Bacteria ESCHERICHIA COLI  Final      Susceptibility   Escherichia coli - MIC*    AMPICILLIN <=2 SENSITIVE Sensitive     CEFAZOLIN <=4 SENSITIVE Sensitive     CEFTRIAXONE <=1 SENSITIVE Sensitive     CIPROFLOXACIN <=0.25 SENSITIVE Sensitive     GENTAMICIN <=  1 SENSITIVE Sensitive     IMIPENEM <=0.25 SENSITIVE Sensitive     NITROFURANTOIN <=16 SENSITIVE Sensitive     TRIMETH/SULFA <=20 SENSITIVE Sensitive     AMPICILLIN/SULBACTAM <=2 SENSITIVE Sensitive     PIP/TAZO <=4 SENSITIVE Sensitive     * >=100,000 COLONIES/mL ESCHERICHIA COLI  Culture, blood (routine x 2)     Status: None (Preliminary result)   Collection Time: 06/17/15  9:24 PM  Result Value Ref Range Status   Specimen Description BLOOD LEFT ARM  Final   Special Requests BOTTLES DRAWN AEROBIC AND ANAEROBIC 10 CC  Final   Culture   Final    NO GROWTH 4 DAYS Performed at [facility]    Report Status PENDING  Incomplete  Culture, blood (routine x 2)     Status: None   Collection Time: 06/17/15  9:37 PM  Result Value Ref Range Status   Specimen Description BLOOD BLOOD LEFT FOREARM  Final   Special Requests IN PEDIATRIC BOTTLE 4 CC  Final   Culture  Setup Time   Final    GRAM NEGATIVE RODS IN PEDIATRIC BOTTLE CRITICAL RESULT CALLED TO, READ BACK BY AND VERIFIED WITH: SOPHIA $RemoveBefo'[email or web address]$  06/19/15 MKELLY    Culture   Final    ESCHERICHIA COLI Performed at [facility]    Report Status 06/21/2015 FINAL  Final   Organism ID, Bacteria ESCHERICHIA COLI  Final      Susceptibility   Escherichia coli - MIC*    AMPICILLIN <=2 SENSITIVE Sensitive     CEFAZOLIN <=4 SENSITIVE Sensitive     CEFEPIME <=1 SENSITIVE Sensitive     CEFTAZIDIME <=1 SENSITIVE Sensitive     CEFTRIAXONE <=1 SENSITIVE Sensitive     CIPROFLOXACIN <=0.25 SENSITIVE Sensitive      GENTAMICIN <=1 SENSITIVE Sensitive     IMIPENEM <=0.25 SENSITIVE Sensitive     TRIMETH/SULFA <=20 SENSITIVE Sensitive     AMPICILLIN/SULBACTAM <=2 SENSITIVE Sensitive     * ESCHERICHIA COLI  Culture, routine-abscess     Status: None   Collection Time: 06/19/15  5:11 PM  Result Value Ref Range Status   Specimen Description PROSTATE  Final   Special Requests NONE  Final   Gram Stain   Final    ABUNDANT WBC PRESENT,BOTH PMN AND MONONUCLEAR NO SQUAMOUS EPITHELIAL CELLS SEEN RARE GRAM NEGATIVE RODS Performed at [facility]    Culture   Final    MODERATE ESCHERICHIA COLI Performed at [facility]    Report Status 06/22/2015 FINAL  Final   Organism ID, Bacteria ESCHERICHIA COLI  Final      Susceptibility   Escherichia coli - MIC*    AMPICILLIN <=2 SENSITIVE Sensitive     AMPICILLIN/SULBACTAM <=2 SENSITIVE Sensitive     CEFEPIME <=1 SENSITIVE Sensitive     CEFTAZIDIME <=1 SENSITIVE Sensitive     CEFTRIAXONE <=1 SENSITIVE Sensitive     CIPROFLOXACIN <=0.25 SENSITIVE Sensitive     GENTAMICIN <=1 SENSITIVE Sensitive     IMIPENEM <=0.25 SENSITIVE Sensitive     PIP/TAZO <=4 SENSITIVE Sensitive     TOBRAMYCIN <=1 SENSITIVE Sensitive     TRIMETH/SULFA Value in next row Sensitive      <=20 SENSITIVE(NOTE)    * MODERATE ESCHERICHIA COLI  Culture, blood (routine x 2)     Status: None (Preliminary result)   Collection Time: 06/20/15  1:55 PM  Result Value Ref Range Status   Specimen Description BLOOD LEFT WRIST  Final   Special Requests  BOTTLES DRAWN AEROBIC AND ANAEROBIC 10CC EACH  Final   Culture   Final    NO GROWTH 2 DAYS Performed at [facility]    Report Status PENDING  Incomplete  Culture, blood (routine x 2)     Status: None (Preliminary result)   Collection Time: 06/20/15  2:05 PM  Result Value Ref Range Status   Specimen Description BLOOD LEFT ARM  Final   Special Requests BOTTLES DRAWN AEROBIC AND ANAEROBIC 10CC EACH  Final   Culture   Final    NO  GROWTH 2 DAYS Performed at [facility]    Report Status PENDING  Incomplete   Scheduled Meds: . cefTRIAXone (ROCEPHIN)  IV  2 g Intravenous Q24H  . famotidine  20 mg Oral Daily  . furosemide  40 mg Oral Daily  . hydrALAZINE  25 mg Oral BID  . isosorbide dinitrate  20 mg Oral BID  . metoCLOPramide  10 mg Oral TID AC & HS  . metolazone  5 mg Oral Daily  . pantoprazole  40 mg Oral BID  . potassium chloride SA  40 mEq Oral Daily  . sertraline  100 mg Oral Daily  . warfarin  2.5 mg Oral ONCE-1800  . Warfarin - Pharmacist Dosing Inpatient   Does not apply [phone]

## 2015-06-23 NOTE — Progress Notes (Addendum)
RN walked into patient's room during rounding around 0050. RN saw patient sitting on the floor naked with all the sheets off the bed in front of him. 2 RN's and a NT, and [facility] off the floor and back in the bed using the lift. Patient was able to answer the orientation questions once he got back in the bed, except for why he was here, but at first when he was found on the floor he said he was at a game. All the other questions related to the fall, the patient was unable to answer. Patient has been on and off confused all night. Vitals were WNL. Patient had no new cuts or bruises. On call was notified and no new orders were given.  Urinal and call bell were right next to the patient.

## 2015-06-23 NOTE — Progress Notes (Signed)
Vitals stable Drain 20 cc Dr Owens SharkBudsyn to see in am and likely will order repeat CT Will need prolonged course of po antibiotics

## 2015-06-23 NOTE — Progress Notes (Signed)
Referring Physician(s): Budzyn  Chief Complaint:  Peri prostatic abscess  Subjective:  Abscess drain placed 101/2 Output 20 cc yesterday Bloody/serous afeb Feeling better  Allergies: Review of patient's allergies indicates no known allergies.  Medications: Prior to Admission medications   Medication Sig Start Date End Date Taking? Authorizing Provider  furosemide (LASIX) 40 MG tablet Take 1 tablet (40 mg total) by mouth daily. 06/13/15  Yes Ruben ImJeremy Ford, MD  ipratropium-albuterol (DUONEB) 0.5-2.5 (3) MG/3ML SOLN Take 3 mLs by nebulization every 6 (six) hours as needed.   Yes Historical Provider, MD  isosorbide dinitrate (ISORDIL) 20 MG tablet Take 20 mg by mouth 2 (two) times daily.   Yes Historical Provider, MD  lidocaine (LIDODERM) 5 % Place 1 patch onto the skin daily. Remove & Discard patch within 12 hours or as directed by MD To right shoulder   Yes Historical Provider, MD  potassium chloride SA (K-DUR,KLOR-CON) 20 MEQ tablet Take 2 tablets (40 mEq total) by mouth 4 (four) times daily. 04/22/15  Yes Sharee Holstereborah S Green, NP  sertraline (ZOLOFT) 100 MG tablet Take 100 mg by mouth daily.   Yes Historical Provider, MD  warfarin (COUMADIN) 10 MG tablet Take 10 mg by mouth daily. 05/31/15  Yes Historical Provider, MD  clonazePAM (KLONOPIN) 0.5 MG tablet Take 0.5 mg by mouth 3 (three) times daily.    Historical Provider, MD  famotidine (PEPCID) 20 MG tablet Take 20 mg by mouth daily.    Historical Provider, MD  folic acid (FOLVITE) 1 MG tablet Take 1 mg by mouth daily.    Historical Provider, MD  hydrALAZINE (APRESOLINE) 25 MG tablet Take 37.5 mg by mouth 2 (two) times daily.    Historical Provider, MD  metoCLOPramide (REGLAN) 10 MG tablet Take 10 mg by mouth 4 (four) times daily -  before meals and at bedtime.    Historical Provider, MD  metolazone (ZAROXOLYN) 5 MG tablet Take 5 mg by mouth daily.    Historical Provider, MD  Omega-3 Fatty Acids (FISH OIL) 1000 MG CAPS Take 1,000 mg by  mouth daily.     Historical Provider, MD  ondansetron (ZOFRAN) 4 MG tablet Take 4 mg by mouth every 6 (six) hours.    Historical Provider, MD     Vital Signs: BP 141/80 mmHg  Pulse 94  Temp(Src) 98 F (36.7 C) (Oral)  Resp 22  Ht 6' (1.829 m)  Wt 288 lb (130.636 kg)  BMI 39.05 kg/m2  SpO2 96%  Physical Exam  Abdominal: Soft.  Skin: Skin is warm and dry.  Rt buttock drain site clean and dry NT No bleeding Output 20 cc yesterday Serous/bloody Cx: Ecoli    Imaging: Dg Chest 2 View  06/21/2015  CLINICAL DATA:  Dyspnea. EXAM: CHEST  2 VIEW COMPARISON:  June 17, 2015. FINDINGS: Stable cardiomediastinal silhouette. Endotracheal tube is in grossly good position. No pneumothorax or pleural effusion is noted. Both lungs are clear. The visualized skeletal structures are unremarkable. IMPRESSION: No active cardiopulmonary disease. Electronically Signed   By: Lupita RaiderJames  Green Jr, M.D.   On: 06/21/2015 14:36   Ct Image Guided Drainage Percut Cath  Peritoneal Retroperit  06/19/2015  CLINICAL DATA:  64 year old with a prostate abscess. Request for image guided drainage. EXAM: CT-GUIDED DRAINAGE OF PROSTATE ABSCESS Physician: Rachelle HoraAdam R. Lowella DandyHenn, MD FLUOROSCOPY TIME:  None MEDICATIONS: 2 mg Versed, 50 mcg fentanyl. A radiology nurse monitored the patient for moderate sedation. ANESTHESIA/SEDATION: Moderate sedation time: 20 minutes PROCEDURE: The procedure was explained to  the patient. The risks and benefits of the procedure were discussed and the patient's questions were addressed. Informed consent was obtained from the patient. The patient was placed prone and CT images through the pelvis were obtained. The fluid collection along the right side of the prostate was targeted. Gluteal region was prepped with Betadine and a sterile field was created. Skin was anesthetized with 1% lidocaine. An 18 gauge needle was directed into the fluid collection along the right side of the prostate. Yellow purulent fluid  was aspirated. A stiff Amplatz wire was advanced into the collection. The tract was dilated to accommodate a 10 [place] multipurpose drain. Approximately 30 mL of purulent fluid was removed. Catheter was attached to a suction bulb and sutured in place. Dressing placed over the drain. FINDINGS: Fluid collection along the right side of the prostate compatible with an abscess. 30 mL of purulent fluid was removed. Estimated blood loss: Minimal COMPLICATIONS: None IMPRESSION: CT-guided drainage of the prostate abscess. Electronically Signed   By: Richarda Overlie M.D.   On: 06/19/2015 18:04    Labs:  CBC:  Recent Labs  06/20/15 0432 06/21/15 0500 06/22/15 0415 06/23/15 0517  WBC 9.6 11.0* 11.7* 11.8*  HGB 11.6* 12.1* 13.1 13.4  HCT 36.7* 37.2* 40.3 41.4  PLT 335 373 420* 414*    COAGS:  Recent Labs  01/09/15 1510 01/28/15 2009  06/18/15 0721 06/19/15 0422 06/22/15 1725 06/23/15 0517  INR 1.07 1.22  < > 2.29* 1.30 1.47 1.61*  APTT 25 55*  --   --   --   --   --   < > = values in this interval not displayed.  BMP:  Recent Labs  06/20/15 0432 06/21/15 0500 06/22/15 0415 06/23/15 0517  NA 138 134* 133* 137  K 3.0* 2.9* 3.4* 3.2*  CL 102 94* 90* 93*  CO2 GLUCOSE 113* 126* 114* 126*  BUN 28*  CALCIUM 9.3 9.6 9.6 10.2  CREATININE 0.74 0.78 0.95 1.23  GFRNONAA >60 >60 >60 >60  GFRAA >60 >60 >60 >60    LIVER FUNCTION TESTS:  Recent Labs  01/02/15 0430  01/24/15 0342  01/30/15 0655 03/04/15 0555 03/07/15 0553 03/14/15 0552 06/17/15 1609  BILITOT 1.9*  --  1.4*  --  0.8  --   --   --  1.1  AST 45*  --  57*  --  45*  --   --   --  25  ALT 29  --  36  --  49  --   --   --  29  ALKPHOS 80  --  91  --  113  --   --   --  173*  PROT 7.2  --  6.1*  --  6.8  --   --   --  7.9  ALBUMIN 3.3*  < > 2.5*  < > 2.8* 3.4* 3.2* 3.2* 3.1*  < > = values in this interval not displayed.  Assessment and Plan:  Peri prostatic abscess drain intact Re Ct soon When  output 10-15 cc/day Will follow If goes home with drain can be followed with IR drain clinic as OP Let us know  Signed: Gearold Wainer A 06/23/2015, 9:38 AM   I spent a total of 15 Minutes at the the patient's bedside AND on the patient's hospital floor or unit, greater than 50% of which was counseling/coordinating care for abscess drain

## 2015-06-23 NOTE — Progress Notes (Signed)
   06/23/15 0100  What Happened  Was fall witnessed? No  Was patient injured? No  Patient found on floor  Found by Staff-comment Linward Natal(Kyaira Trantham)  Stated prior activity other (comment) (in the bed, was unable to get up)  Follow Up  MD notified Bretta BangEasterwood  Time MD notified 0100  Family notified No- patient refusal  Additional tests No  Simple treatment Other (comment) (NA)  Adult Fall Risk Assessment  Risk Factor Category (scoring not indicated) High fall risk per protocol (document High fall risk)  Patient's Fall Risk High Fall Risk (>13 points)  Adult Fall Risk Interventions  Required Bundle Interventions *See Row Information* High fall risk - low, moderate, and high requirements implemented  Fall with Injury Screening  Risk For Fall Injury- See Row Information  Nurse judgement  Intervention(s) for 2 or more risk criteria identified Low Bed  Pain Assessment  Pain Assessment No/denies pain  Pain Score 0  PCA/Epidural/Spinal Assessment  Respiratory Pattern Regular;Unlabored  Neurological  Neuro (WDL) X  Level of Consciousness Alert  Orientation Level Oriented to person;Oriented to place;Oriented to time;Disoriented to situation  Cognition Memory impairment;Poor judgement;Poor attention/concentration  Speech Clear  Musculoskeletal  Musculoskeletal (WDL) X  Generalized Weakness Yes  Integumentary  Integumentary (WDL) X (blister on left thigh)  Skin Condition Dry  Skin Integrity Skin tear;MSAD  Moisture Associated Skin Damage Location Buttocks  Moisture Associated Skin Damage Intervention Barrier cream  Skin Tear Location Thigh  Skin Tear Location Orientation Right  Skin Tear Intervention Foam

## 2015-06-24 ENCOUNTER — Encounter (HOSPITAL_COMMUNITY): Payer: Self-pay | Admitting: Radiology

## 2015-06-24 ENCOUNTER — Inpatient Hospital Stay (HOSPITAL_COMMUNITY): Payer: No Typology Code available for payment source

## 2015-06-24 LAB — BASIC METABOLIC PANEL
Anion gap: 15 (ref 5–15)
BUN: 32 mg/dL — AB (ref 6–20)
CALCIUM: 10.2 mg/dL (ref 8.9–10.3)
CHLORIDE: 91 mmol/L — AB (ref 101–111)
CO2: 31 mmol/L (ref 22–32)
CREATININE: 1.12 mg/dL (ref 0.61–1.24)
GFR calc Af Amer: >60 mL/min (ref >60)
GFR calc non Af Amer: >60 mL/min (ref >60)
Glucose, Bld: 119 mg/dL — ABNORMAL HIGH (ref 65–99)
Potassium: 3.1 mmol/L — ABNORMAL LOW (ref 3.5–5.1)
Sodium: 137 mmol/L (ref 135–145)

## 2015-06-24 LAB — CBC
HEMATOCRIT: 40.9 % (ref 39.0–52.0)
HEMOGLOBIN: 13.3 g/dL (ref 13.0–17.0)
MCH: 28.1 pg (ref 26.0–34.0)
MCHC: 32.5 g/dL (ref 30.0–36.0)
MCV: 86.3 fL (ref 78.0–100.0)
Platelets: 418 10*3/uL — ABNORMAL HIGH (ref 150–400)
RBC: 4.74 MIL/uL (ref 4.22–5.81)
RDW: 15.7 % — AB (ref 11.5–15.5)
WBC: 11.4 10*3/uL — ABNORMAL HIGH (ref 4.0–10.5)

## 2015-06-24 LAB — PROTIME-INR
INR: 1.74 — AB (ref 0.00–1.49)
PROTHROMBIN TIME: 20.3 s — AB (ref 11.6–15.2)

## 2015-06-24 MED ORDER — IOHEXOL 300 MG/ML  SOLN
100.0000 mL | Freq: Once | INTRAMUSCULAR | Status: AC | PRN
  Administered 2015-06-24: 100 mL via INTRAVENOUS

## 2015-06-24 MED ORDER — BISACODYL 10 MG RE SUPP: 10.0000 mg | Freq: Every day | RECTAL | Status: DC | PRN

## 2015-06-24 MED ORDER — SENNOSIDES-DOCUSATE SODIUM 8.6-50 MG PO TABS
1.0000 | ORAL_TABLET | Freq: Two times a day (BID) | ORAL | Status: DC
  Administered 2015-06-24 – 2015-06-25 (×3): 1 via ORAL
  Filled 2015-06-24 (×3): qty 1

## 2015-06-24 MED ORDER — ENSURE ENLIVE PO LIQD
237.0000 mL | Freq: Two times a day (BID) | ORAL | Status: DC
  Administered 2015-06-25 – 2015-06-26 (×2): 237 mL via ORAL

## 2015-06-24 MED ORDER — SENNOSIDES-DOCUSATE SODIUM 8.6-50 MG PO TABS: 1.0000 | ORAL_TABLET | Freq: Two times a day (BID) | ORAL | Status: DC

## 2015-06-24 MED ORDER — POTASSIUM CHLORIDE CRYS ER 20 MEQ PO TBCR
40.0000 meq | EXTENDED_RELEASE_TABLET | Freq: Once | ORAL | Status: AC
  Administered 2015-06-24: 40 meq via ORAL
  Filled 2015-06-24: qty 2

## 2015-06-24 MED ORDER — POLYETHYLENE GLYCOL 3350 17 G PO PACK
17.0000 g | PACK | Freq: Every day | ORAL | Status: DC
  Administered 2015-06-24 – 2015-06-26 (×2): 17 g via ORAL
  Filled 2015-06-24 (×3): qty 1

## 2015-06-24 MED ORDER — WARFARIN SODIUM 2.5 MG PO TABS
2.5000 mg | ORAL_TABLET | Freq: Once | ORAL | Status: AC
  Administered 2015-06-24: 2.5 mg via ORAL
  Filled 2015-06-24: qty 1

## 2015-06-24 NOTE — Progress Notes (Signed)
Patient ID: [name], male   DOB: June 19, 1951, 64 y.o.   MRN: [id number]    Referring Physician(s): Budzyn,B  Chief Complaint:  Prostate abscess  Subjective:  Pt without new c/o  Allergies: Review of patient's allergies indicates no known allergies.  Medications: Prior to Admission medications   Medication Sig Start Date End Date Taking? Authorizing Provider  furosemide (LASIX) 40 MG tablet Take 1 tablet (40 mg total) by mouth daily. 06/13/15  Yes Ruben Im, MD  ipratropium-albuterol (DUONEB) 0.5-2.5 (3) MG/3ML SOLN Take 3 mLs by nebulization every 6 (six) hours as needed.   Yes Historical Provider, MD  isosorbide dinitrate (ISORDIL) 20 MG tablet Take 20 mg by mouth 2 (two) times daily.   Yes Historical Provider, MD  lidocaine (LIDODERM) 5 % Place 1 patch onto the skin daily. Remove & Discard patch within 12 hours or as directed by MD To right shoulder   Yes Historical Provider, MD  potassium chloride SA (K-DUR,KLOR-CON) 20 MEQ tablet Take 2 tablets (40 mEq total) by mouth 4 (four) times daily. 04/22/15  Yes Sharee Holster, NP  sertraline (ZOLOFT) 100 MG tablet Take 100 mg by mouth daily.   Yes Historical Provider, MD  warfarin (COUMADIN) 10 MG tablet Take 10 mg by mouth daily. 05/31/15  Yes Historical Provider, MD  ciprofloxacin (CIPRO) 500 MG tablet Take 1 tablet (500 mg total) by mouth 2 (two) times daily. 06/23/15   Dorothea Ogle, MD  clonazePAM (KLONOPIN) 0.5 MG tablet Take 1 tablet (0.5 mg total) by mouth 3 (three) times daily. 06/23/15   Dorothea Ogle, MD  famotidine (PEPCID) 20 MG tablet Take 20 mg by mouth daily.    Historical Provider, MD  folic acid (FOLVITE) 1 MG tablet Take 1 mg by mouth daily.    Historical Provider, MD  hydrALAZINE (APRESOLINE) 25 MG tablet Take 37.5 mg by mouth 2 (two) times daily.    Historical Provider, MD  metoCLOPramide (REGLAN) 10 MG tablet Take 10 mg by mouth 4 (four) times daily -  before meals and at bedtime.    Historical Provider, MD    metolazone (ZAROXOLYN) 5 MG tablet Take 5 mg by mouth daily.    Historical Provider, MD  Omega-3 Fatty Acids (FISH OIL) 1000 MG CAPS Take 1,000 mg by mouth daily.     Historical Provider, MD  ondansetron (ZOFRAN) 4 MG tablet Take 4 mg by mouth every 6 (six) hours.    Historical Provider, MD     Vital Signs: BP 123/74 mmHg  Pulse 76  Temp(Src) 98 F (36.7 C) (Oral)  Resp 16  Ht 6' (1.829 m)  Wt 289 lb (131.09 kg)  BMI 39.19 kg/m2  SpO2 93%  Physical Exam TG/prostate drain intact, insertion site ok, mildly tender, output 15 cc; cx's- E coli  Imaging: Dg Chest 2 View  06/21/2015  CLINICAL DATA:  Dyspnea. EXAM: CHEST  2 VIEW COMPARISON:  June 17, 2015. FINDINGS: Stable cardiomediastinal silhouette. Endotracheal tube is in grossly good position. No pneumothorax or pleural effusion is noted. Both lungs are clear. The visualized skeletal structures are unremarkable. IMPRESSION: No active cardiopulmonary disease. Electronically Signed   By: Lupita Raider, M.D.   On: 06/21/2015 14:36   Ct Abdomen Pelvis W Contrast  06/24/2015  CLINICAL DATA:  Acute left lower quadrant abdominal pain. EXAM: CT ABDOMEN AND PELVIS WITH CONTRAST TECHNIQUE: Multidetector CT imaging of the abdomen and pelvis was performed using the standard protocol following bolus administration of intravenous contrast. CONTRAST:  100mL OMNIPAQUE IOHEXOL 300 MG/ML  SOLN COMPARISON:  CT scan of June 17, 2015. FINDINGS: Visualized lung bases are unremarkable. No significant osseous abnormality is noted. No gallstones are noted. Fatty infiltration of the liver is noted with sparing around the gallbladder fossa. The spleen and pancreas unremarkable. Duodenum diverticulum is noted. Adrenal glands appear normal. Bilateral nephrolithiasis is noted. No hydronephrosis or renal obstruction is noted. IVC filter is noted in infrarenal position. Atherosclerosis of abdominal aorta is noted without aneurysm formation. Appendicolith is noted  in the appendix; otherwise the appendix appears normal. There is no evidence of bowel obstruction. Sigmoid diverticulosis is noted without inflammation. Urinary bladder appears normal. There is been interval placement of drainage catheter into the right side of the pelvis using trans gluteal approach. Fluid collection noted to the right and inferior to prostate gland on prior exam has been completely decompressed. No significant adenopathy is noted. IMPRESSION: Fatty infiltration of the liver. Bilateral nonobstructive nephrolithiasis. Atherosclerosis of abdominal aorta without aneurysm formation. Sigmoid diverticulosis is noted without inflammation. Interval placement of transgluteal drainage catheter into right side of the pelvis. Fluid collection noted adjacent to prostate gland on prior exam has been completely decompressed. Electronically Signed   By: Lupita RaiderJames  Green Jr, M.D.   On: 06/24/2015 13:05   Dg Abd 2 Views  06/24/2015  CLINICAL DATA:  Prostate abscess. EXAM: ABDOMEN - 2 VIEW COMPARISON:  CT 06/19/2015 FINDINGS: Drainage catheter in the pelvis again noted. This appears in stable position from prior CT. Dilated loops of small and large bowel. Findings most consistent adynamic ileus. Cecum measures 13 cm in diameter. Stable right nephrolithiasis. IVC filter in stable position. No acute abnormality. IMPRESSION: 1. Drainage catheter within previously identified prostate abscess in stable position. 2. Dilated loops of small and large bowel consistent with adynamic ileus. The cecum measures 13 cm. No cecal wall thickening noted. Follow-up abdominal series suggested to demonstrate resolution. These results will be called to the ordering clinician or representative by the Radiologist Assistant, and communication documented in the PACS or zVision Dashboard. Electronically Signed   By: Maisie Fushomas  Register   On: 06/24/2015 09:40    Labs:  CBC:  Recent Labs  06/21/15 0500 06/22/15 0415 06/23/15 0517  06/24/15 0428  WBC 11.0* 11.7* 11.8* 11.4*  HGB 12.1* 13.1 13.4 13.3  HCT 37.2* 40.3 41.4 40.9  PLT 373 420* 414* 418*    COAGS:  Recent Labs  01/09/15 1510 01/28/15 2009  06/19/15 0422 06/22/15 1725 06/23/15 0517 06/24/15 0428  INR 1.07 1.22  < > 1.30 1.47 1.61* 1.74*  APTT 25 55*  --   --   --   --   --   < > = values in this interval not displayed.  BMP:  Recent Labs  06/21/15 0500 06/22/15 0415 06/23/15 0517 06/24/15 0428  NA 134* 133* 137 137  K 2.9* 3.4* 3.2* 3.1*  CL 94* 90* 93* 91*  CO2 31 30 31 31   GLUCOSE 126* 114* 126* 119*  BUN 14 19 28* 32*  CALCIUM 9.6 9.6 10.2 10.2  CREATININE 0.78 0.95 1.23 1.12  GFRNONAA >60 >60 >60 >60  GFRAA >60 >60 >60 >60    LIVER FUNCTION TESTS:  Recent Labs  01/02/15 0430  01/24/15 0342  01/30/15 0655 03/04/15 0555 03/07/15 0553 03/14/15 0552 06/17/15 1609  BILITOT 1.9*  --  1.4*  --  0.8  --   --   --  1.1  AST 45*  --  57*  --  45*  --   --   --  25  ALT 29  --  36  --  49  --   --   --  29  ALKPHOS 80  --  91  --  113  --   --   --  173*  PROT 7.2  --  6.1*  --  6.8  --   --   --  7.9  ALBUMIN 3.3*  < > 2.5*  < > 2.8* 3.4* 3.2* 3.2* 3.1*  < > = values in this interval not displayed.  Assessment and Plan: Pt s/p drainage of peri-prostatic abscess 10/12; cx's- e coli; afebrile; WBC 11.4; hgb 13.3;creat nl; K 3.1;  drain output minimal; f/u CT today reveals complete decompression of periprostatic abscess; following d/w Drs. Shick/Budzyn decision made to remove drain; drain removed in its entirety without immediate complications. Gauze dressing applied to site. Replace K.    Signed: D. Jeananne Rama 06/24/2015, 4:11 PM   I spent a total of 15 minutes at the the patient's bedside AND on the patient's hospital floor or unit, greater than 50% of which was counseling/coordinating care for periprostatic  abscess drain

## 2015-06-24 NOTE — Progress Notes (Signed)
Nutrition Follow-up   DOCUMENTATION CODES:   Morbid obesity  INTERVENTION:  - Will order Ensure Enlive po BID, each supplement provides 350 kcal and 20 grams of protein - Encourage PO intakes of meals and supplements when pt alert enough for intakes - RD will continue to monitor for needs  NUTRITION DIAGNOSIS:   Inadequate oral intake related to inability to eat as evidenced by NPO status. 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 He also has had increased brownish secretions from his trach but no dyspnea or chest pain.LL edema has been worsening.INR has been supra-therapeutic: he was told to hold Coumadin last Thursday as INR was 6.8 in clinic.   10/17 Per chart review, diet advanced 06/19/15 PM. Pt consumed 100% breakfast, 75% lunch, and 25% dinner 10/13, 50% all meals 10/14, and 5% of breakfast this AM. Pt sleeping at time of RD visit. Pt briefly opened eyes and RD asked pt about breakfast intake and appetite overall. Pt fell back to sleep and did not reawake despite attempts to reengage pt.   Likely not meeting needs on average. Medications reviewed. Labs reviewed; K: 3.1 mmol/L, Cl: 91 mmol/L, BUN elevated.    10/11 Pt has been NPO since admission and unable to meet needs. He has trach in place. Pt reports he has not had anything to eat x3 days due to general feelings of well including nausea and abdominal pain. Pt denies any chewing or swallowing difficulties or any foods he needs to avoid due to trach site. He  states that prior to the past 3 days he had a good appetite and was eating well.  No muscle or fat wasting noted. Pt believes that he has lost weight recently but is unsure of UBW or amount of weight he may have lost. Per chart review, pt lost 55 lbs (15% body weight) in the past 7 months which is significant for time frame. Unable to confirm malnutrition at this time; will monitor weight trends and edema as well as intakes once diet advanced.  Diet Order:  Diet regular Room service appropriate?: Yes; Fluid consistency:: Thin  Skin:  Reviewed, no issues  Last BM:  10/14  Height:   Ht Readings from Last 1 Encounters:  06/17/15 6' (1.829 m)    Weight:   Wt Readings from Last 1 Encounters:  06/24/15 289 lb (131.09 kg)    Ideal Body Weight:  80.91 kg (kg)  BMI:  Body mass index is 39.19 kg/(m^2).  Estimated Nutritional Needs:   Kcal:  2075-2275  Protein:  85-95 grams  Fluid:  1.6-1.9 L/day  EDUCATION NEEDS:   No education needs identified at this time      Trenton GammonJessica Rick Warnick, RD, LDN Inpatient Clinical Dietitian Pager # [phone] After hours/weekend pager # [phone]

## 2015-06-24 NOTE — Progress Notes (Signed)
15 cc  From drain over last 24 hrs No change in GU status  Filed Vitals:   06/23/15 2126 06/23/15 2305 06/24/15 0303 06/24/15 0432  BP: 142/74   123/80  Pulse: 84 93 82 78  Temp: 98 F (36.7 C)   98.2 F (36.8 C)  TempSrc: Oral   Oral  Resp: 16 16 16 16   Height:      Weight:    289 lb (131.09 kg)  SpO2: 96% 98% 93% 93%    I/O last 3 completed shifts: In: 105 [P.O.:30; Other:25; IV Piggyback:50] Out: 1925 [Urine:1900; Drains:25]    NAD Soft NT ND No foley Drain with minimal purulent material  CBC    Component Value Date/Time   WBC 11.4* 06/24/2015 0428   WBC 9.3 11/29/2014 1439   RBC 4.74 06/24/2015 0428   RBC 5.77 11/29/2014 1439   HGB 13.3 06/24/2015 0428   HGB 18.0 11/29/2014 1439   HCT 40.9 06/24/2015 0428   HCT 32.3* 01/31/2015 0322   HCT 56.5* 11/29/2014 1439   PLT 418* 06/24/2015 0428   MCV 86.3 06/24/2015 0428   MCV 98.0* 11/29/2014 1439   MCH 28.1 06/24/2015 0428   MCH 31.2 11/29/2014 1439   MCHC 32.5 06/24/2015 0428   MCHC 31.9 11/29/2014 1439   RDW 15.7* 06/24/2015 0428   LYMPHSABS 1.1 06/17/2015 2137   MONOABS 0.9 06/17/2015 2137   EOSABS 0.1 06/17/2015 2137   BASOSABS 0.0 06/17/2015 2137   BMP Latest Ref Rng 06/24/2015 06/23/2015 06/22/2015  Glucose 65 - 99 mg/dL 130(Q119(H) 657(Q126(H) 469(G114(H)  BUN 6 - 20 mg/dL 29(B32(H) 28(U28(H) 19  Creatinine 0.61 - 1.24 mg/dL 1.321.12 4.401.23 1.020.95  BUN/Creat Ratio 10 - 22 - - -  Sodium 135 - 145 mmol/L 137 137 133(L)  Potassium 3.5 - 5.1 mmol/L 3.1(L) 3.2(L) 3.4(L)  Chloride 101 - 111 mmol/L 91(L) 93(L) 90(L)  CO2 22 - 32 mmol/L 31 31 30   Calcium 8.9 - 10.3 mg/dL 72.510.2 36.610.2 9.6       Urine Culture: E. Coli Blood culture: E. Coli Drain Culture: E. Coli  1. Prostatic abscess/sepsis -continue abscess drain for now -Recommend repeating CT with IV contrast only today to assess abscess -will d/c drain tomorrow if CT shows abscess resolution -will need long course of PO abx  2. Air in the left collecting system and  bladder -see above  3. Nonobstructing nephrolithiasis -No further workup this time. This can be addressed in the nonacute setting.

## 2015-06-24 NOTE — Progress Notes (Signed)
OT Cancellation Note  Patient Details Name: [name] MRN: [id number] DOB: September 18, 1950   Cancelled Treatment:    Reason Eval/Treat Not Completed: Fatigue/lethargy limiting ability to participate  Will check back on pt as schedule allows  Eris Breck, Dorena BodoLorraine D  Lori Treyvonne Tata, [place] [phone] 06/24/2015, 2:28 PM

## 2015-06-24 NOTE — Progress Notes (Signed)
ANTICOAGULATION CONSULT NOTE - Follow up  Pharmacy Consult for Warfarin Indication: Hx DVT  No Known Allergies  Patient Measurements: Height: 6' (182.9 cm) Weight: 289 lb (131.09 kg) IBW/kg (Calculated) : 77.6  Vital Signs: Temp: 98.2 F (36.8 C) (10/17 0432) Temp Source: Oral (10/17 0432) BP: 123/80 mmHg (10/17 0432) Pulse Rate: 78 (10/17 0432)  Labs:  Recent Labs  06/22/15 0415 06/22/15 1725 06/23/15 0517 06/24/15 0428  HGB 13.1  --  13.4 13.3  HCT 40.3  --  41.4 40.9  PLT 420*  --  414* 418*  LABPROT  --  17.9* 19.2* 20.3*  INR  --  1.47 1.61* 1.74*  CREATININE 0.95  --  1.23 1.12    Estimated Creatinine Clearance: 95.8 mL/min (by C-G formula based on Cr of 1.12).  Assessment: [age] yoM presented 10/10 with LLQ pain, abd abscess near prostate with air in bladder and required drainage of abscess on 10/12.  PMH includes chronic warfarin for history of DVT.  Warfarin was held on admission, and reversed with Vit K PO on 10/10-10/11 prior to drainage.  Pharmacy is now consulted to resume warfarin dosing.  Home warfarin dose was 10mg  daily, but it was held since 10/6 due to elevated INR 6.8 before admission.  Today, 06/24/2015:  INR subtherapeutic but trending up following warfarin resuming 10/15  CBC: Hgb and Plt remain WNL  Drain output continues, no bleeding or complications per RN.  Diet: Regular  Drug-drug interactions: cephalosporins may enhance anticoag effects of warfarin  Note patient found on the floor in his room 10/16am. No apparent injuries.  Goal of Therapy:  INR 2-3 Monitor platelets by anticoagulation protocol: Yes   Plan:   INR trending up with conservative warfarin dosing. Continue dosing conservatively d/t recent SUPRAtherapeutic INR, give 2.5mg  PO tonight at 1800.  Daily INR.  Juliette Alcideustin Eleina Jergens, PharmD, BCPS.   Pager: [phone] 06/24/2015,8:48 AM.

## 2015-06-24 NOTE — Progress Notes (Signed)
Spoke with pt today about his "fall" . Pt denies falling " I guess I would know if I actually fell, I got up then sat down on the floor and then I couldn't get back up" . I did not fall!

## 2015-06-24 NOTE — Progress Notes (Signed)
Patient ID: [name], male   DOB: Nov 20, 1950, 64 y.o.   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 Pt also has had increased brownish secretions from his trach but no dyspnea or chest pain. Please, also note that prior to this admission, pt was seen by PCP and was told his INR was > 6 and he was told to stop taking Coumadin.  Major events since admission:  10/17 - abd more distended, pt has refused stool softeners, abd XRAy c/w ileus  Assessment/Plan:    Principal Problem:   Abscess, prostate - per urologist, pt is now s/p drain of prostate abscess, post op day #5 - cultures from abscess with E. Coli  - urine and blood cultures also positive for E. Coli, pan sensitive, changed to Rocephin 10/14 - WBC still up, monitor closely, likely still up as pt now with ileus  - today is day #7 of ABX, will need long terms ABX - per urology team, will plan on repeating CT abd tomorrow to ensure resolution of the abscess after drain removal  - Monitor Cr, UOP  Active Problems:   Sepsis due to E. Coli UTI, bacteremia, prostatic abscess  - please note that pt met criteria for sepsis on admission with HR > 80, WBC > 12.3, ? source UTI, prostate abscess  - prostate abscess fluid, blood and urine culture all positive for E. Coli, continue Rocephin as noted above  - repeat blood cultures 10/13 --> no growth to date  - plan on repeating CT abd in am - CBC in AM    Narcotic induced ileus - confirmed with abd XRAY - pt has refused repeatedly stool softeners but I have  explained to him findings on XRAY and he has agreed - he wants to continue eating and I think that is reasonable - monitor for now  - encourage ambulation but pt has been refusing  - repeat ABX XRAY in AM      ? Fall overnight 10/15 - unclear if pt fell, but per report found on floor - monitor with fall precautions     Chronic deep vein thrombosis (DVT) ([place]) - on Coumadin at home but has been on hold for now until abscess drain done - INR 1.74 this AM - per pharmacy     Obesity hypoventilation syndrome ([place]) - trach collar in place - more gargles this AM, CXR with no signs of acute cardiopulmonary abnormalities     Hypokalemia - still low  - continue to supplement and repeat BMP in AM - check Mg level with next blood work as well     BMI 50.0-59.9, adult ([place]) - Body mass index is 41.9 kg/(m^2)    Tobacco abuse - cessation consultation provided     Tracheostomy status ([place]) - monitor oxygen saturation  - pt has refused trach maintenance and has refused to sit up as recommended     Essential hypertension, benign - reasonable inpatient control  - continue Lasix, Imdur     Chronic diastolic congestive heart failure ([place]) - weight 138 kg on admission, now down to  131 kg - continue home regimen with Lasix  - monitor daily weights, strict I/O  DVT prophylaxis - SCD's  Code Status: Full.  Family Communication:  plan of care discussed with the patient Disposition Plan: SNF when ileus resolved and once we confirm that prostate abscess has resolved, needs repeat CT abd/pelvis tomorrow AM 10/18 per urology team   IV access:  Peripheral IV  Procedures and diagnostic studies:    Ct Abdomen Pelvis W Contrast 06/17/2015  Peripherally enhancing fluid collection or collections centered about the right-side of the prostate and pelvic cul-de-sac. Suspicious for abscess/ abscesses. 2. Air within the urinary bladder and left renal collecting system/ureter. Suspicious for ascending  infection. Edema in the left hemipelvis, along the course of the left ureter, is likely secondary. 3. Fluid-filled large and small bowel loops are suspicious for adynamic ileus. No focal transition point identified. 4. Bilateral nephrolithiasis. 5.  Atherosclerosis, including within the coronary arteries. 6. Mild motion degradation.   Dg Chest Port 1 View 06/17/2015  Cardiomegaly with mild pulmonary vascular congestion.   Dg Abd 2 Views 06/24/2015 Drainage catheter within previously identified prostate abscess in stable position. 2. Dilated loops of small and large bowel consistent with adynamic ileus. The cecum measures 13 cm. No cecal wall thickening noted. Follow-up abdominal series suggested to demonstrate resolution.  Medical Consultants:  Urology   Other Consultants:  None  IAnti-Infectives:   Vancomycin 10/11 --> 10/14 Zosyn 10/11 --> 10/14 Rocephin 10/14 -->  Faye Ramsay, MD  [facility] Pager [phone]   If 7PM-7AM, please contact night-coverage [email or web address] Password [email or web address] 06/24/2015, 10:20 AM   LOS: 7 days   HPI/Subjective: Pt reports no bowel movement, reluctant to take stool softener, says if he takes it he would have to get up and he does not like to get up.   Objective: Filed Vitals:   06/23/15 2305 06/24/15 0303 06/24/15 0432 06/24/15 0800  BP:   123/80   Pulse: 93 82 78   Temp:   98.2 F (36.8 C)   TempSrc:   Oral   Resp: $Remo'16 16 16   'OnwQX$ Height:      Weight:   131.09 kg (289 lb)   SpO2: 98% 93% 93% 92%    Intake/Output Summary (Last 24 hours) at 06/24/15 1020 Last data filed at 06/24/15 0942  Gross per 24 hour  Intake    210 ml  Output   1235 ml  Net  -1025 ml    Exam:   General:  Pt is alert, follows commands appropriately, NAD, morbidly obese, trach collar in place   Cardiovascular: Regular rate and rhythm, no rubs, no gallops  Respiratory: Clear to auscultation bilaterally, diminished breath sounds at bases with upper resp gargles    Abdomen: Soft, non tender, distended, bowel sounds present but faint, no guarding  Data Reviewed: Basic Metabolic Panel:  Recent Labs Lab 06/20/15 0432 06/21/15 0500 06/22/15 0415 06/23/15 0517 06/24/15 0428  NA 138 134* 133* 137 137  K 3.0* 2.9* 3.4* 3.2* 3.1*  CL 102 94* 90* 93* 91*  CO2 $Re'25 31 30 31 31  'EPA$ GLUCOSE 113* 126* 114* 126* 119*  BUN $Re'16 14 19 'MbR$ 28* 32*  CREATININE 0.74 0.78 0.95 1.23 1.12  CALCIUM 9.3 9.6 9.6 10.2 10.2   Liver Function Tests:  Recent Labs Lab 06/17/15 1609  AST 25  ALT 29  ALKPHOS 173*  BILITOT 1.1  PROT 7.9  ALBUMIN 3.1*   CBC:  Recent Labs Lab 06/17/15 1406 06/17/15 2137  06/20/15 0432  06/21/15 0500 06/22/15 0415 06/23/15 0517 06/24/15 0428  WBC 12.6* 12.6*  < > 9.6 11.0* 11.7* 11.8* 11.4*  NEUTROABS 11.1* 10.5*  --   --   --   --   --   --   HGB 13.6 12.6*  < > 11.6* 12.1* 13.1 13.4 13.3  HCT 41.0 38.4*  < > 36.7* 37.2* 40.3 41.4 40.9  MCV 86.1 87.5  < > 87.4 85.1 85.4 86.6 86.3  PLT 282 298  < > 335 373 420* 414* 418*  < > = values in this interval not displayed. Cardiac Enzymes:  Recent Labs Lab 06/17/15 1609  CKTOTAL 79   Recent Results (from the past 240 hour(s))  Culture, Urine     Status: None   Collection Time: 06/17/15  9:04 PM  Result Value Ref Range Status   Specimen Description URINE, CLEAN CATCH  Final   Special Requests NONE  Final   Culture   Final    >=100,000 COLONIES/mL ESCHERICHIA COLI Performed at [facility]    Report Status 06/20/2015 FINAL  Final   Organism ID, Bacteria ESCHERICHIA COLI  Final      Susceptibility   Escherichia coli - MIC*    AMPICILLIN <=2 SENSITIVE Sensitive     CEFAZOLIN <=4 SENSITIVE Sensitive     CEFTRIAXONE <=1 SENSITIVE Sensitive     CIPROFLOXACIN <=0.25 SENSITIVE Sensitive     GENTAMICIN <=1 SENSITIVE Sensitive     IMIPENEM <=0.25 SENSITIVE Sensitive     NITROFURANTOIN <=16 SENSITIVE Sensitive     TRIMETH/SULFA <=20 SENSITIVE Sensitive      AMPICILLIN/SULBACTAM <=2 SENSITIVE Sensitive     PIP/TAZO <=4 SENSITIVE Sensitive     * >=100,000 COLONIES/mL ESCHERICHIA COLI  Culture, blood (routine x 2)     Status: None   Collection Time: 06/17/15  9:24 PM  Result Value Ref Range Status   Specimen Description BLOOD LEFT ARM  Final   Special Requests BOTTLES DRAWN AEROBIC AND ANAEROBIC 10 CC  Final   Culture   Final    NO GROWTH 5 DAYS Performed at [facility]    Report Status 06/23/2015 FINAL  Final  Culture, blood (routine x 2)     Status: None   Collection Time: 06/17/15  9:37 PM  Result Value Ref Range Status   Specimen Description BLOOD BLOOD LEFT FOREARM  Final   Special Requests IN PEDIATRIC BOTTLE 4 CC  Final   Culture  Setup Time   Final    GRAM NEGATIVE RODS IN PEDIATRIC BOTTLE CRITICAL RESULT CALLED TO, READ BACK BY AND VERIFIED WITH: SOPHIA $RemoveBefo'[id number]$  06/19/15 MKELLY    Culture   Final    ESCHERICHIA COLI Performed at [facility]    Report Status 06/21/2015 FINAL  Final   Organism ID, Bacteria ESCHERICHIA COLI  Final      Susceptibility   Escherichia coli - MIC*    AMPICILLIN <=2 SENSITIVE Sensitive     CEFAZOLIN <=4 SENSITIVE Sensitive     CEFEPIME <=1 SENSITIVE Sensitive     CEFTAZIDIME <=1 SENSITIVE Sensitive     CEFTRIAXONE <=1 SENSITIVE Sensitive     CIPROFLOXACIN <=0.25 SENSITIVE Sensitive     GENTAMICIN <=1 SENSITIVE Sensitive     IMIPENEM <=0.25 SENSITIVE Sensitive     TRIMETH/SULFA <=20 SENSITIVE Sensitive     AMPICILLIN/SULBACTAM <=2 SENSITIVE Sensitive     * ESCHERICHIA COLI  Culture, routine-abscess     Status: None   Collection Time: 06/19/15  5:11 PM  Result Value Ref Range Status   Specimen Description PROSTATE  Final   Special Requests NONE  Final   Gram Stain   Final    ABUNDANT WBC PRESENT,BOTH PMN AND MONONUCLEAR NO SQUAMOUS EPITHELIAL CELLS SEEN RARE GRAM NEGATIVE RODS Performed at [facility]    Culture   Final    MODERATE ESCHERICHIA COLI Performed at  [facility]    Report Status 06/22/2015 FINAL  Final   Organism ID, Bacteria ESCHERICHIA COLI  Final      Susceptibility   Escherichia coli - MIC*    AMPICILLIN <=2 SENSITIVE Sensitive     AMPICILLIN/SULBACTAM <=2 SENSITIVE Sensitive     CEFEPIME <=1 SENSITIVE Sensitive     CEFTAZIDIME <=1 SENSITIVE Sensitive     CEFTRIAXONE <=1 SENSITIVE Sensitive     CIPROFLOXACIN <=0.25 SENSITIVE Sensitive     GENTAMICIN <=1 SENSITIVE Sensitive     IMIPENEM <=0.25 SENSITIVE Sensitive     PIP/TAZO <=4 SENSITIVE Sensitive     TOBRAMYCIN <=1 SENSITIVE Sensitive     TRIMETH/SULFA Value in next row Sensitive      <=20 SENSITIVE(NOTE)    * MODERATE ESCHERICHIA COLI  Culture, blood (routine x 2)     Status: None (Preliminary result)   Collection Time: 06/20/15  1:55 PM  Result Value Ref Range Status   Specimen Description BLOOD LEFT WRIST  Final   Special Requests BOTTLES DRAWN AEROBIC AND ANAEROBIC 10CC EACH  Final   Culture   Final    NO GROWTH 3 DAYS Performed at [facility]    Report Status PENDING  Incomplete  Culture, blood (routine x 2)     Status: None (Preliminary result)   Collection Time: 06/20/15  2:05 PM  Result Value Ref Range Status   Specimen Description BLOOD LEFT ARM  Final   Special Requests BOTTLES DRAWN AEROBIC AND ANAEROBIC 10CC EACH  Final   Culture   Final    NO GROWTH 3 DAYS Performed at [facility]    Report Status PENDING  Incomplete   Scheduled Meds: . cefTRIAXone (ROCEPHIN)  IV  2 g Intravenous Q24H  . famotidine  20 mg Oral Daily  . furosemide  40 mg Oral Daily  . hydrALAZINE  25 mg Oral BID  . isosorbide dinitrate  20 mg Oral BID  . metoCLOPramide  10 mg Oral TID AC & HS  . metolazone  5 mg Oral Daily  . pantoprazole  40 mg Oral BID  . polyethylene glycol  17 g Oral Daily  . potassium chloride SA  40 mEq Oral Daily  . potassium chloride  40 mEq Oral Once  . senna-docusate  1 tablet Oral BID  . sertraline  100 mg Oral Daily  .  warfarin  2.5 mg Oral ONCE-1800  . Warfarin - Pharmacist Dosing Inpatient   Does not apply [phone]

## 2015-06-25 ENCOUNTER — Inpatient Hospital Stay (HOSPITAL_COMMUNITY): Payer: No Typology Code available for payment source

## 2015-06-25 DIAGNOSIS — N390 Urinary tract infection, site not specified: Secondary | ICD-10-CM

## 2015-06-25 DIAGNOSIS — B9620 Unspecified Escherichia coli [E. coli] as the cause of diseases classified elsewhere: Secondary | ICD-10-CM | POA: Diagnosis present

## 2015-06-25 DIAGNOSIS — N200 Calculus of kidney: Secondary | ICD-10-CM

## 2015-06-25 DIAGNOSIS — I82729 Chronic embolism and thrombosis of deep veins of unspecified upper extremity: Secondary | ICD-10-CM

## 2015-06-25 DIAGNOSIS — K567 Ileus, unspecified: Secondary | ICD-10-CM | POA: Diagnosis present

## 2015-06-25 DIAGNOSIS — A4151 Sepsis due to Escherichia coli [E. coli]: Secondary | ICD-10-CM | POA: Diagnosis present

## 2015-06-25 DIAGNOSIS — D72829 Elevated white blood cell count, unspecified: Secondary | ICD-10-CM

## 2015-06-25 DIAGNOSIS — R7881 Bacteremia: Secondary | ICD-10-CM | POA: Diagnosis present

## 2015-06-25 LAB — BASIC METABOLIC PANEL
Anion gap: 15 (ref 5–15)
BUN: 36 mg/dL — AB (ref 6–20)
CALCIUM: 10.3 mg/dL (ref 8.9–10.3)
CHLORIDE: 90 mmol/L — AB (ref 101–111)
CO2: 30 mmol/L (ref 22–32)
CREATININE: 1.23 mg/dL (ref 0.61–1.24)
GFR calc non Af Amer: >60 mL/min (ref >60)
Glucose, Bld: 128 mg/dL — ABNORMAL HIGH (ref 65–99)
Potassium: 3.3 mmol/L — ABNORMAL LOW (ref 3.5–5.1)
SODIUM: 135 mmol/L (ref 135–145)

## 2015-06-25 LAB — CBC
HCT: 41.1 % (ref 39.0–52.0)
HEMOGLOBIN: 13.1 g/dL (ref 13.0–17.0)
MCH: 27.6 pg (ref 26.0–34.0)
MCHC: 31.9 g/dL (ref 30.0–36.0)
MCV: 86.7 fL (ref 78.0–100.0)
Platelets: 461 10*3/uL — ABNORMAL HIGH (ref 150–400)
RBC: 4.74 MIL/uL (ref 4.22–5.81)
RDW: 15.8 % — ABNORMAL HIGH (ref 11.5–15.5)
WBC: 11.4 10*3/uL — ABNORMAL HIGH (ref 4.0–10.5)

## 2015-06-25 LAB — CULTURE, BLOOD (ROUTINE X 2)
Culture: NO GROWTH
Culture: NO GROWTH

## 2015-06-25 LAB — PROTIME-INR
INR: 2.03 — AB (ref 0.00–1.49)
PROTHROMBIN TIME: 22.8 s — AB (ref 11.6–15.2)

## 2015-06-25 LAB — MAGNESIUM: MAGNESIUM: 2.1 mg/dL (ref 1.7–2.4)

## 2015-06-25 MED ORDER — CEFAZOLIN SODIUM-DEXTROSE 2-3 GM-% IV SOLR
2.0000 g | Freq: Three times a day (TID) | INTRAVENOUS | Status: DC
  Administered 2015-06-25 – 2015-06-26 (×3): 2 g via INTRAVENOUS
  Filled 2015-06-25 (×3): qty 50

## 2015-06-25 MED ORDER — DOCUSATE SODIUM 100 MG PO CAPS
100.0000 mg | ORAL_CAPSULE | Freq: Two times a day (BID) | ORAL | Status: DC
  Administered 2015-06-25 – 2015-06-26 (×3): 100 mg via ORAL
  Filled 2015-06-25 (×3): qty 1

## 2015-06-25 MED ORDER — WARFARIN SODIUM 2.5 MG PO TABS
1.2500 mg | ORAL_TABLET | Freq: Once | ORAL | Status: AC
Start: 2015-06-25 — End: 2015-06-25
  Administered 2015-06-25: 1.25 mg via ORAL
  Filled 2015-06-25: qty 0.5

## 2015-06-25 MED ORDER — BISACODYL 10 MG RE SUPP
10.0000 mg | Freq: Once | RECTAL | Status: AC
  Administered 2015-06-25: 10 mg via RECTAL
  Filled 2015-06-25: qty 1

## 2015-06-25 MED ORDER — MORPHINE SULFATE (PF) 2 MG/ML IV SOLN
1.0000 mg | INTRAVENOUS | Status: DC | PRN
  Administered 2015-06-25 – 2015-06-26 (×2): 1 mg via INTRAVENOUS
  Filled 2015-06-25 (×3): qty 1

## 2015-06-25 NOTE — Progress Notes (Signed)
ANTICOAGULATION CONSULT NOTE - Follow up  Pharmacy Consult for Warfarin Indication: Hx DVT  No Known Allergies  Patient Measurements: Height: 6' (182.9 cm) Weight: 257 lb (116.574 kg) IBW/kg (Calculated) : 77.6  Vital Signs: Temp: 98.4 F (36.9 C) (10/18 0432) Temp Source: Oral (10/18 0432) BP: 120/69 mmHg (10/18 0910) Pulse Rate: 78 (10/18 0910)  Labs:  Recent Labs  06/23/15 0517 06/24/15 0428 06/25/15 0427  HGB 13.4 13.3 13.1  HCT 41.4 40.9 41.1  PLT 414* 418* 461*  LABPROT 19.2* 20.3* 22.8*  INR 1.61* 1.74* 2.03*  CREATININE 1.23 1.12 1.23    Estimated Creatinine Clearance: 82.1 mL/min (by C-G formula based on Cr of 1.23).  Assessment: 89 yoM presented 10/10 with LLQ pain, abd abscess near prostate with air in bladder and required drainage of abscess on 10/12.  PMH includes chronic warfarin for history of DVT.  Warfarin was held on admission, and reversed with Vit K PO on 10/10-10/11 prior to drainage.  Pharmacy is now consulted to resume warfarin dosing.  Home warfarin dose was 10mg  daily, but it was held since 10/6 due to elevated INR 6.8 before admission.  Today, 06/25/2015:  INR therapeutic this morning (trending up with 2.5mg  x 3 doses)  CBC: Hgb and Plt remain WNL  Prostatic drain removed 10/17.  Diet: Regular  Drug-drug interactions: cephalosporins may enhance anticoag effects of warfarin  Note patient found on the floor in his room 10/16am. No apparent injuries.  Goal of Therapy:  INR 2-3 Monitor platelets by anticoagulation protocol: Yes   Plan:   INR trended up to therapeutic range this morning with conservative warfarin dosing. Continue dosing conservatively d/t recent SUPRAtherapeutic INR, give 1.25mg  PO tonight at 1800.  Daily INR.  Juliette Alcideustin Zeigler, PharmD, BCPS.   Pager: [phone] 06/25/2015,9:46 AM.

## 2015-06-25 NOTE — Addendum Note (Signed)
Addended by: Neomia DearPOWERS, Zoye Chandra E on: 06/25/2015 09:19 PM   Modules accepted: Orders

## 2015-06-25 NOTE — Progress Notes (Addendum)
Patient ID: [name], male   DOB: 11-18-1950, 64 y.o.   MRN: [id number] [facility] PROGRESS NOTE  [name] WUJ:[id number] DOB: 1950/10/12 DOA: 06/17/2015 PCP: Ruben Im, MD  Brief narrative:    64 year old male with past medical history of chronic DVT s/p IVC filter, on anticoagulation with coumadin, COPD, chronic diastolic CHF, HTN, with recent hospitalization for respiratory failure s/p trach. Pt also had increased brownish secretions from his trach but no dyspnea or chest pain. Additionally, prior to this admission pt was seen by PCP and was told his INR was > 6 and he was told to stop taking Coumadin.  Patient presented to [facility] ED with progressively worsening LLQ pain for 4 days PTA associated with dysuria and urinary urgency but difficulty with emptying bladder.  CT abdomen on admission showed peripherally enhancing fluid collection or collections centered about the right-side of the prostate and pelvic cul-de-sac, suspicious for abscess/ abscesses. Also seen was air within the urinary bladder and left renal collecting system/ureter suspicious for ascending infection. In addition, fluid-filled large and small bowel loops are suspicious for adynamic ileus.  Patient is s/p drainage of peri-prostatic abscess 10/12 with cultures growing E.Coli. Follow up CT on 10/17 demonstrated complete decompression of periprostetic abscess so drain subsequently removed in its entirety 10/17.  Hospital course complicated with E.Coli UTI and bacteremia. Repeat blood cultures showed no growth. Pt however will require long term abx treatment but possibly PO instead of IV.         Patient had more abdominal distention on 10/17 and X ray study at that time demonstrated ileus.   Anticipated discharge: Anticipate discharge once cleared by urology.    Assessment/Plan:    Principal Problem: Abscess, prostate / Leukocytosis - Patient is s/p drainage of peri-prostatic abscess 10/12 with cultures  growing E.Coli. Follow up CT on 10/17 demonstrated complete decompression of periprostetic abscess so drain subsequently removed in its entirety 10/17 - Appreciate urology input - WBC count stable at 11.4.  Active Problems: Sepsis due to Escherichia coli (HCC) / Bacteremia due to Escherichia coli / E-coli UTI -Sepsis on admission with tachycardia, leukocytosis and source of infection prostatic abscess with cultures from abscess positive for E.Coli, E.Coli bacteremia and UTI - Repeat blood cultures so far show no growth to date - Currently on Rocephin but per pharmacy recommendations will narrow down to cefazolin. Augmentin on discharge for at least 2 weeks.  - WBC count still up at 11.4  Narcotic induced ileus - Abd x ray consistent with ileus - Minimize narcotics, reduced morphine to 1 mg instead of 2 mg - Added bisacodyl supp once now along with colace BID and miralax - No BM in past 24 hours    Fall overnight 10/15 - Continue fall precautions, no further reports of falls since 10/15  Chronic deep vein thrombosis (DVT) (HCC) - Continue anticoagulation with coumadin, dosing per pharmacy   Hypokalemia - Secondary to lasix - Continue to supplement   BMI 50.0-59.9, adult (HCC) - Body mass index is 41.9 kg/(m^2) - Counseled on diet - Seen by dietician   Tobacco abuse - Smoking cessation counseling provided   Tracheostomy status (HCC) / Obesity hypoventilation syndrome (HCC) / Chronic respiratory failure with hypoxia (HCC) - Continue to monitor oxygen saturation  - Patient refused trach maintenance and has refused to sit up as recommended   Essential hypertension, benign - Continue lasix 40 mg daily, hydralazine 25 mg PO BID, Isordil 20 mg PO BID   Chronic diastolic  congestive heart failure (HCC) - 2 D ECHO in 12/2014 showed grade 1 diastolic dysfunction with preserved EF - Compensated CHF - Weight trend since pat 72 hours: 130 kg --> 131 kg --> 116.5 kg  - Continue daily  lasix 40 mg - Strict intake and output adn daily weight - Replete electrolytes   Nephrolithiasis, non obstructive - Appreciate GU input, no further work up indicated at this time   DVT Prophylaxis  - SCD's bilaterally   Code Status: Full.  Family Communication:  plan of care discussed with the patient Disposition Plan: Home once cleared by urology.   IV access:  Peripheral IV  Procedures and diagnostic studies:     Ct Abdomen Pelvis W Contrast 06/24/2015   Fatty infiltration of the liver. Bilateral nonobstructive nephrolithiasis. Atherosclerosis of abdominal aorta without aneurysm formation. Sigmoid diverticulosis is noted without inflammation. Interval placement of transgluteal drainage catheter into right side of the pelvis. Fluid collection noted adjacent to prostate gland on prior exam has been completely decompressed. Electronically Signed   By: Lupita Raider, M.D.   On: 06/24/2015 13:05   Dg Abd 2 Views 06/24/2015   1. Drainage catheter within previously identified prostate abscess in stable position. 2. Dilated loops of small and large bowel consistent with adynamic ileus. The cecum measures 13 cm. No cecal wall thickening noted. Follow-up abdominal series suggested to demonstrate resolution. These results will be called to the ordering clinician or representative by the Radiologist Assistant, and communication documented in the PACS or zVision Dashboard. Electronically Signed   By: Maisie Fus  Register   On: 06/24/2015 09:40   Dg Chest 2 View 06/21/2015  No active cardiopulmonary disease. Electronically Signed   By: Lupita Raider, M.D.   On: 06/21/2015 14:36   Ct Abdomen Pelvis W Contrast 06/17/2015 Peripherally enhancing fluid collection or collections centered about the right-side of the prostate and pelvic cul-de-sac. Suspicious for abscess/ abscesses. 2. Air within the urinary bladder and left renal collecting system/ureter. Suspicious for ascending infection. Edema in the left  hemipelvis, along the course of the left ureter, is likely secondary. 3. Fluid-filled large and small bowel loops are suspicious for adynamic ileus. No focal transition point identified. 4. Bilateral nephrolithiasis. 5. Atherosclerosis, including within the coronary arteries. 6. Mild motion degradation.   Dg Chest Port 1 View 06/17/2015 Cardiomegaly with mild pulmonary vascular congestion.  Medical Consultants:  Urology. Dr. Ricki Miller  Interventional radiology  Other Consultants:  Physical therapy  IAnti-Infectives:   Cefazolin 06/25/2015 --> Rocephin 10/14 --> 06/25/2015 Vancomycin 10/11 --> 10/14 Zosyn 10/11 --> 10/14    Manson Passey, MD  [facility] Pager [phone]  Time spent in minutes: 25 minutes  If 7PM-7AM, please contact night-coverage [email or web address] Password [email or web address] 06/25/2015, 10:16 AM   LOS: 8 days    HPI/Subjective: No acute overnight events. Patient reports pain in lower abdomen.   Objective: Filed Vitals:   06/25/15 0432 06/25/15 0759 06/25/15 0804 06/25/15 0910  BP: 126/53   120/69  Pulse: 76 68 80 78  Temp: 98.4 F (36.9 C)     TempSrc: Oral     Resp: Height:      Weight: 116.574 kg (257 lb)     SpO2: 92% 89% 94% 94%    Intake/Output Summary (Last 24 hours) at 06/25/15 1016 Last data filed at 06/25/15 0913  Gross per 24 hour  Intake    150 ml  Output   1300 ml  Net  -1150 ml  Exam:   General:  Pt is alert, follows commands appropriately, not in acute distress  Cardiovascular: Regular rate and rhythm, S1/S2 (+)  Respiratory: Clear to auscultation bilaterally, no wheezing, no crackles, no rhonchi  Abdomen: Obese, tender in lower abdomen, (+ )BS  Extremities: trace pitting LE edema, pulses DP and PT palpable bilaterally  Neuro: Grossly nonfocal  Data Reviewed: Basic Metabolic Panel:  Recent Labs Lab 06/21/15 0500 06/22/15 0415 06/23/15 0517 06/24/15 0428 06/25/15 0427  NA 134* 133* 137 137 135  K  2.9* 3.4* 3.2* 3.1* 3.3*  CL 94* 90* 93* 91* 90*  CO2 31 30 31 31 30   GLUCOSE 126* 114* 126* 119* 128*  BUN 14 19 28* 32* 36*  CREATININE 0.78 0.95 1.23 1.12 1.23  CALCIUM 9.6 9.6 10.2 10.2 10.3  MG  --   --   --   --  2.1   Liver Function Tests: No results for input(s): AST, ALT, ALKPHOS, BILITOT, PROT, ALBUMIN in the last 168 hours. No results for input(s): LIPASE, AMYLASE in the last 168 hours. No results for input(s): AMMONIA in the last 168 hours. CBC:  Recent Labs Lab 06/21/15 0500 06/22/15 0415 06/23/15 0517 06/24/15 0428 06/25/15 0427  WBC 11.0* 11.7* 11.8* 11.4* 11.4*  HGB 12.1* 13.1 13.4 13.3 13.1  HCT 37.2* 40.3 41.4 40.9 41.1  MCV 85.1 85.4 86.6 86.3 86.7  PLT 373 420* 414* 418* 461*   Cardiac Enzymes: No results for input(s): CKTOTAL, CKMB, CKMBINDEX, TROPONINI in the last 168 hours. BNP: Invalid input(s): POCBNP CBG:  Recent Labs Lab 06/19/15 1117  GLUCAP 143*    Culture, Urine     Status: None   Collection Time: 06/17/15  9:04 PM  Result Value Ref Range Status   Specimen Description URINE, CLEAN CATCH  Final   Special Requests NONE  Final   Culture   Final    >=100,000 COLONIES/mL ESCHERICHIA COLI Performed at [facility]    Report Status 06/20/2015 FINAL  Final   Organism ID, Bacteria ESCHERICHIA COLI  Final      Susceptibility   Escherichia coli - MIC*    AMPICILLIN <=2 SENSITIVE Sensitive     CEFAZOLIN <=4 SENSITIVE Sensitive     CEFTRIAXONE <=1 SENSITIVE Sensitive     CIPROFLOXACIN <=0.25 SENSITIVE Sensitive     GENTAMICIN <=1 SENSITIVE Sensitive     IMIPENEM <=0.25 SENSITIVE Sensitive     NITROFURANTOIN <=16 SENSITIVE Sensitive     TRIMETH/SULFA <=20 SENSITIVE Sensitive     AMPICILLIN/SULBACTAM <=2 SENSITIVE Sensitive     PIP/TAZO <=4 SENSITIVE Sensitive     * >=100,000 COLONIES/mL ESCHERICHIA COLI  Culture, blood (routine x 2)     Status: None   Collection Time: 06/17/15  9:24 PM  Result Value Ref Range Status   Specimen  Description BLOOD LEFT ARM  Final   Culture   Final    NO GROWTH 5 DAYS    Report Status 06/23/2015 FINAL  Final  Culture, blood (routine x 2)     Status: None   Collection Time: 06/17/15  9:37 PM  Result Value Ref Range Status   Specimen Description BLOOD BLOOD LEFT FOREARM  Final   Special Requests IN PEDIATRIC BOTTLE 4 CC  Final   Culture  Setup Time   Final   Culture   Final    ECHERICHIA COLI   Report Status 06/21/2015 FINAL  Final   Organism ID, Bacteria ESCHERICHIA COLI  Final      Susceptibility  Escherichia coli - MIC*    AMPICILLIN <=2 SENSITIVE Sensitive     CEFAZOLIN <=4 SENSITIVE Sensitive     CEFEPIME <=1 SENSITIVE Sensitive     CEFTAZIDIME <=1 SENSITIVE Sensitive     CEFTRIAXONE <=1 SENSITIVE Sensitive     CIPROFLOXACIN <=0.25 SENSITIVE Sensitive     GENTAMICIN <=1 SENSITIVE Sensitive     IMIPENEM <=0.25 SENSITIVE Sensitive     TRIMETH/SULFA <=20 SENSITIVE Sensitive     AMPICILLIN/SULBACTAM <=2 SENSITIVE Sensitive     * ESCHERICHIA COLI  Culture, routine-abscess     Status: None   Collection Time: 06/19/15  5:11 PM  Result Value Ref Range Status   Specimen Description PROSTATE  Final   Special Requests NONE  Final   Gram Stain   Final   Culture   Final    MODERATE ESCHERICHIA COLI   Report Status 06/22/2015 FINAL  Final   Organism ID, Bacteria ESCHERICHIA COLI  Final      Susceptibility   Escherichia coli - MIC*    AMPICILLIN <=2 SENSITIVE Sensitive     AMPICILLIN/SULBACTAM <=2 SENSITIVE Sensitive     CEFEPIME <=1 SENSITIVE Sensitive     CEFTAZIDIME <=1 SENSITIVE Sensitive     CEFTRIAXONE <=1 SENSITIVE Sensitive     CIPROFLOXACIN <=0.25 SENSITIVE Sensitive     GENTAMICIN <=1 SENSITIVE Sensitive     IMIPENEM <=0.25 SENSITIVE Sensitive     PIP/TAZO <=4 SENSITIVE Sensitive     TOBRAMYCIN <=1 SENSITIVE Sensitive     TRIMETH/SULFA Value in next row Sensitive      <=20 SENSITIVE(NOTE)    * MODERATE ESCHERICHIA COLI  Culture, blood (routine x 2)      Status: None (Preliminary result)   Collection Time: 06/20/15  1:55 PM  Result Value Ref Range Status   Specimen Description BLOOD LEFT WRIST  Final   Culture   Final    NO GROWTH 4 DAYS    Report Status PENDING  Incomplete  Culture, blood (routine x 2)     Status: None (Preliminary result)   Collection Time: 06/20/15  2:05 PM  Result Value Ref Range Status   Specimen Description BLOOD LEFT ARM  Final   Culture   Final    NO GROWTH 4 DAYS    Report Status PENDING  Incomplete     Scheduled Meds: . cefTRIAXone (ROCEPHIN)  IV  2 g Intravenous Q24H  . famotidine  20 mg Oral Daily  . feeding supplement (ENSURE ENLIVE)  237 mL Oral BID BM  . furosemide  40 mg Oral Daily  . hydrALAZINE  25 mg Oral BID  . isosorbide dinitrate  20 mg Oral BID  . metoCLOPramide  10 mg Oral TID AC & HS  . metolazone  5 mg Oral Daily  . pantoprazole  40 mg Oral BID  . polyethylene glycol  17 g Oral Daily  . potassium chloride SA  40 mEq Oral Daily  . senna-docusate  1 tablet Oral BID  . sertraline  100 mg Oral Daily  . warfarin  1.25 mg Oral ONCE-1800  . Warfarin - Pharmacist Dosing Inpatient   Does not apply q1800   Continuous Infusions:

## 2015-06-25 NOTE — Progress Notes (Signed)
Abscess resolved on CT Drain removed yesterday by IR  . Filed Vitals:   06/25/15 0432 06/25/15 0759 06/25/15 0804 06/25/15 0910  BP: 126/53   120/69  Pulse: 76 68 80 78  Temp: 98.4 F (36.9 C)     TempSrc: Oral     Resp: 18 16 18 20   Height:      Weight: 257 lb (116.574 kg)     SpO2: 92% 89% 94% 94%   I/O last 3 completed shifts: In: 310 [P.O.:300; Other:10] Out: 1530 [Urine:1525; Drains:5] Total I/O In: -  Out: 250 [Urine:250]  NAD Soft NT ND No foley Drain removed  CBC Latest Ref Rng 06/25/2015 06/24/2015 06/23/2015  WBC 4.0 - 10.5 K/uL 11.4(H) 11.4(H) 11.8(H)  Hemoglobin 13.0 - 17.0 g/dL 16.113.1 09.613.3 04.513.4  Hematocrit 39.0 - 52.0 % 41.1 40.9 41.4  Platelets 150 - 400 K/uL 461(H) 418(H) 414(H)    BMP Latest Ref Rng 06/25/2015 06/24/2015 06/23/2015  Glucose 65 - 99 mg/dL 409(W128(H) 119(J119(H) 478(G126(H)  BUN 6 - 20 mg/dL 95(A36(H) 21(H32(H) 08(M28(H)  Creatinine 0.61 - 1.24 mg/dL 5.781.23 4.691.12 6.291.23  BUN/Creat Ratio 10 - 22 - - -  Sodium 135 - 145 mmol/L 135 137 137  Potassium 3.5 - 5.1 mmol/L 3.3(L) 3.1(L) 3.2(L)  Chloride 101 - 111 mmol/L 90(L) 91(L) 93(L)  CO2 22 - 32 mmol/L 30 31 31   Calcium 8.9 - 10.3 mg/dL 52.810.3 41.310.2 24.410.2    Urine Culture: E. Coli Blood culture: E. Coli Drain Culture: E. Coli  1. Prostatic abscess/sepsis -continue long course of PO abx -ok for d/c home from urology standpoint -f/u in 2 weeks as outpatient  2. Air in the left collecting system and bladder -see above  3. Nonobstructing nephrolithiasis -No further workup this time. This can be addressed in the nonacute setting.

## 2015-06-26 ENCOUNTER — Other Ambulatory Visit: Payer: Self-pay | Admitting: Adult Health

## 2015-06-26 DIAGNOSIS — B9620 Unspecified Escherichia coli [E. coli] as the cause of diseases classified elsewhere: Secondary | ICD-10-CM

## 2015-06-26 DIAGNOSIS — N390 Urinary tract infection, site not specified: Secondary | ICD-10-CM

## 2015-06-26 LAB — PROTIME-INR
INR: 2.12 — ABNORMAL HIGH (ref 0.00–1.49)
PROTHROMBIN TIME: 23.6 s — AB (ref 11.6–15.2)

## 2015-06-26 MED ORDER — DOCUSATE SODIUM 100 MG PO CAPS: 100.0000 mg | ORAL_CAPSULE | Freq: Two times a day (BID) | ORAL | Status: AC

## 2015-06-26 MED ORDER — CIPROFLOXACIN HCL 500 MG PO TABS: 500.0000 mg | ORAL_TABLET | Freq: Two times a day (BID) | ORAL | Status: DC

## 2015-06-26 MED ORDER — WARFARIN SODIUM 2.5 MG PO TABS
1.2500 mg | ORAL_TABLET | Freq: Once | ORAL | Status: AC
  Administered 2015-06-26: 1.25 mg via ORAL
  Filled 2015-06-26: qty 0.5

## 2015-06-26 MED ORDER — WARFARIN SODIUM 2.5 MG PO TABS: 1.2500 mg | ORAL_TABLET | Freq: Every day | ORAL | Status: DC

## 2015-06-26 MED ORDER — POLYETHYLENE GLYCOL 3350 17 G PO PACK: 17.0000 g | PACK | Freq: Every day | ORAL | Status: DC

## 2015-06-26 NOTE — Progress Notes (Signed)
D/c/d via PTAR via stretcher voices no c/o

## 2015-06-26 NOTE — Progress Notes (Signed)
Physical Therapy Treatment Patient Details Name: [name] MRN: [id number] DOB: 09/07/1951 Today's Date: 06/26/2015    History of Present Illness Pt is a 64 year old male s/p drainage of prostatic abscess 10/12 and transgluteal drain placed.  He has a h/o respiratory failure/trach, chronic DVT, HTN, COPD, CHF    PT Comments    Patient  Did  Participate in standing  From recliner  X 2. Requires assist to power up to stand. Will need Recliner to follow closely for ambulation.   Follow Up Recommendations  SNF;Supervision/Assistance - 24 hour     Equipment Recommendations       Recommendations for Other Services       Precautions / Restrictions Precautions Precautions: Fall Precaution Comments: trach, back pain Restrictions Weight Bearing Restrictions: No    Mobility  Bed Mobility               General bed mobility comments: in recliner  Transfers Overall transfer level: Needs assistance Equipment used: Rolling walker (2 wheeled) (1 bed rail to pull up) Transfers: Sit to/from Stand Sit to Stand: Mod assist         General transfer comment: placed recliner beside bed rail and used 1 side of RW, mod assist and momentum to power up to stand. paracticed x 2 . Stood x 30 seconds each. march ion place x 5 steps x 2 trials.  Ambulation/Gait                 Stairs            Wheelchair Mobility    Modified Rankin (Stroke Patients Only)       Balance                                    Cognition Arousal/Alertness: Awake/alert                          Exercises General Exercises - Upper Extremity Shoulder Flexion: AROM;Both;10 reps;Seated General Exercises - Lower Extremity Ankle Circles/Pumps: AROM;Both;10 reps;Supine Hip ABduction/ADduction: AROM;Both;10 reps;Supine Straight Leg Raises: AAROM;Both;10 reps;Supine    General Comments        Pertinent Vitals/Pain Pain Score: 7  Pain Location: back Pain  Intervention(s): Monitored during session;Repositioned    Home Living                      Prior Function            PT Goals ([place] goals can now be found in the care plan section) Progress towards PT goals: Progressing toward goals    Frequency  Min 3X/week    PT Plan [place] plan remains appropriate    Co-evaluation             End of Session   Activity Tolerance: Patient tolerated treatment well;Patient limited by fatigue Patient left: in chair;with call bell/phone within reach;with chair alarm set     Time: 1041-1107 PT Time Calculation (min) (ACUTE ONLY): 26 min  Charges:  $Therapeutic Exercise: 8-22 mins $Therapeutic Activity: 8-22 mins                    G Codes:      Rada HayHill, Lynden Flemmer Elizabeth 06/26/2015, 11:13 AM Blanchard KelchKaren Adley Castello PT [phone]

## 2015-06-26 NOTE — Progress Notes (Signed)
[facility].Spoke to Ryland GroupDebbie Shiflet LPN

## 2015-06-26 NOTE — Progress Notes (Signed)
ANTICOAGULATION CONSULT NOTE - Follow up  Pharmacy Consult for Warfarin Indication: Hx DVT  No Known Allergies  Patient Measurements: Height: 6' (182.9 cm) Weight: 257 lb (116.574 kg) IBW/kg (Calculated) : 77.6  Vital Signs: Temp: 98.4 F (36.9 C) (10/19 0431) Temp Source: Oral (10/19 0431) BP: 123/69 mmHg (10/19 0431) Pulse Rate: 76 (10/19 0431)  Labs:  Recent Labs  06/24/15 0428 06/25/15 0427 06/26/15 0553  HGB 13.3 13.1  --   HCT 40.9 41.1  --   PLT 418* 461*  --   LABPROT 20.3* 22.8* 23.6*  INR 1.74* 2.03* 2.12*  CREATININE 1.12 1.23  --     Estimated Creatinine Clearance: 82.1 mL/min (by C-G formula based on Cr of 1.23).  Assessment: 89 yoM presented 10/10 with LLQ pain, abd abscess near prostate with air in bladder and required drainage of abscess on 10/12.  PMH includes chronic warfarin for history of DVT.  Warfarin was held on admission, and reversed with Vit K PO on 10/10-10/11 prior to drainage.  Pharmacy is now consulted to resume warfarin dosing.  Home warfarin dose was 10mg  daily, but it was held since 10/6 due to elevated INR 6.8 before admission.  Today, 06/26/2015:  INR therapeutic this morning   CBC: Hgb and Plt remain WNL  Prostatic drain removed 10/17.  Diet: Regular  Drug-drug interactions: cephalosporins may enhance anticoag effects of warfarin  Note patient found on the floor in his room 10/16am. No apparent injuries.  Goal of Therapy:  INR 2-3 Monitor platelets by anticoagulation protocol: Yes   Plan: - repeat coumadin 1.25 mg PO x1 today. Continue dosing conservatively d/t recent SUPRAtherapeutic INR - daily INR  Dorna LeitzAnh Tanaja Ganger, PharmD, BCPS 06/26/2015 9:18 AM

## 2015-06-26 NOTE — Discharge Summary (Signed)
Physician Discharge Summary  Fayrene FearingJames Barca ZOX:[id number] DOB: 1950-12-04 DOA: 06/17/2015  PCP: Ruben ImJeremy Ford, MD  Admit date: 06/17/2015 Discharge date: 06/26/2015  Recommendations for Outpatient Follow-up:  1. Pt will need to follow up with PCP in 2-3 weeks post discharge 2. Please obtain BMP to evaluate electrolytes and kidney function, potassium level  3. Please also check CBC to evaluate Hg and Hct levels 4. Please check PT/INR in AM and determine appropriate dose of Coumadin 5. Please also note that pt must continue Ciprofloxacin upon discharge for total of five more weeks post discharge (this will complete recommended and required 6 weeks therapy as pt has been on ABX in  The hospital as well)  6. Please note that pt needs to continue with bowel regimen to prevent constipation  7. Ambulation encouraged as pt able to tolerate   Discharge Diagnoses:  Principal Problem:   Abscess, prostate Active Problems:   Chronic deep vein thrombosis (DVT) (HCC)   Obesity hypoventilation syndrome (HCC)   Hypokalemia   BMI 50.0-59.9, adult (HCC)   Tracheostomy status (HCC)   Essential hypertension, benign   Chronic diastolic congestive heart failure (HCC)   Chronic respiratory failure with hypoxia (HCC)   Bacteremia due to Escherichia coli  Discharge Condition: Stable  Diet recommendation: Heart healthy diet discussed in details   Brief narrative:    64 year old male with past medical history of chronic DVT s/p IVC filter, on anticoagulation with coumadin, COPD, chronic diastolic CHF, HTN, with recent hospitalization for respiratory failure s/p trach. Pt also had increased brownish secretions from his trach but no dyspnea or chest pain. Additionally, prior to this admission pt was seen by PCP and was told his INR was > 6 and he was told to stop taking Coumadin.  [facility] with progressively worsening LLQ pain for 4 days PTA associated with dysuria and urinary urgency but  difficulty with emptying bladder.  CT abdomen on admission showed peripherally enhancing fluid collection or collections centered about the right-side of the prostate and pelvic cul-de-sac, suspicious for abscess/ abscesses. Also seen was air within the urinary bladder and left renal collecting system/ureter suspicious for ascending infection. In addition, fluid-filled large and small bowel loops are suspicious for adynamic ileus.  Patient is s/p drainage of peri-prostatic abscess 10/12 with cultures growing E.Coli. Follow up CT on 10/17 demonstrated complete decompression of periprostetic abscess so drain subsequently removed in its entirety 10/17.  Hospital course complicated with E.Coli UTI and bacteremia. Repeat blood cultures showed no growth. Pt however will require long term abx treatment but possibly PO instead of IV.   Patient had more abdominal distention on 10/17 and X ray study at that time demonstrated ileus.   Assessment/Plan:    Principal Problem: Abscess, prostate / Leukocytosis - Patient is s/p drainage of peri-prostatic abscess 10/12 with cultures growing E.Coli. Follow up CT on 10/17 demonstrated complete decompression of periprostetic abscess so drain subsequently removed in its entirety 10/17 - Appreciate urology input  Active Problems: Sepsis due to Escherichia coli [facility](HCC) / Bacteremia due to Escherichia coli / E-coli UTI -Sepsis on admission with tachycardia, leukocytosis and source of infection prostatic abscess with cultures from abscess positive for E.Coli, E.Coli bacteremia and UTI - Repeat blood cultures so far show no growth to date - continue Cipro upon discharge for 5 more weeks post discharge   Narcotic induced ileus - Abd x ray consistent with ileus - Minimize narcotics - continue with bowel regimen in order to  prevent constipation    Fall overnight 10/15 - Continue fall precautions, no further reports of falls since 10/15  Chronic deep vein  thrombosis (DVT) (HCC) - Continue anticoagulation with coumadin - need PT/INR checked 10/19  Hypokalemia - Secondary to lasix - supplemented prior to discharge   BMI 50.0-59.9, adult (HCC) - Body mass index is 41.9 kg/(m^2) - Counseled on diet - Seen by dietician   Tobacco abuse - Smoking cessation counseling provided   Tracheostomy status (HCC) / Obesity hypoventilation syndrome (HCC) / Chronic respiratory failure with hypoxia (HCC) - Continue to monitor oxygen saturation  - Patient refused trach maintenance and has refused to sit up as recommended   Essential hypertension, benign - Continue lasix 40 mg daily, hydralazine 25 mg PO BID, Isordil 20 mg PO BID   Chronic diastolic congestive heart failure (HCC) - 2 D ECHO in 12/2014 showed grade 1 diastolic dysfunction with preserved EF - Compensated CHF - Weight trend since pat 72 hours: 130 kg --> 131 kg --> 116.5 kg  - Continue daily lasix 40 mg  Nephrolithiasis, non obstructive - Appreciate GU input, no further work up indicated at this time   Code Status: Full.  Family Communication: plan of care discussed with the patient Disposition Plan: SNF today   IV access:  Peripheral IV  Procedures and diagnostic studies:    Ct Abdomen Pelvis W Contrast 06/24/2015 Fatty infiltration of the liver. Bilateral nonobstructive nephrolithiasis. Atherosclerosis of abdominal aorta without aneurysm formation. Sigmoid diverticulosis is noted without inflammation. Interval placement of transgluteal drainage catheter into right side of the pelvis. Fluid collection noted adjacent to prostate gland on prior exam has been completely decompressed. Electronically Signed By: Lupita Raider, M.D. On: 06/24/2015 13:05   Dg Abd 2 Views 06/24/2015 1. Drainage catheter within previously identified prostate abscess in stable position. 2. Dilated loops of small and large bowel consistent with adynamic ileus. The cecum measures 13 cm.  No cecal wall thickening noted. Follow-up abdominal series suggested to demonstrate resolution. These results will be called to the ordering clinician or representative by the Radiologist Assistant, and communication documented in the PACS or zVision Dashboard. Electronically Signed By: Maisie Fus Register On: 06/24/2015 09:40   Dg Chest 2 View 06/21/2015 No active cardiopulmonary disease. Electronically Signed  By: Lupita Raider, M.D. On: 06/21/2015 14:36   Ct Abdomen Pelvis W Contrast 06/17/2015 Peripherally enhancing fluid collection or collections centered about the right-side of the prostate and pelvic cul-de-sac. Suspicious for abscess/ abscesses. 2. Air within the urinary bladder and left renal collecting system/ureter. Suspicious for ascending infection. Edema in the left hemipelvis, along the course of the left ureter, is likely secondary. 3. Fluid-filled large and small bowel loops are suspicious for adynamic ileus. No focal transition point identified. 4. Bilateral nephrolithiasis. 5. Atherosclerosis, including within the coronary arteries. 6. Mild motion degradation.   Dg Chest Port 1 View 06/17/2015 Cardiomegaly with mild pulmonary vascular congestion.  Medical Consultants:  Urology. Dr. Ricki Miller  Interventional radiology  Other Consultants:  Physical therapy  IAnti-Infectives:   Cefazolin 06/25/2015 --> changed to cipro to complete therapy for 5 more weeks  Rocephin 10/14 --> 06/25/2015 Vancomycin 10/11 --> 10/14 Zosyn 10/11 --> 10/14      Discharge Exam: Filed Vitals:   06/26/15 0933  BP: 118/62  Pulse: 74  Temp:   Resp:    Filed Vitals:   06/25/15 1558 06/25/15 2040 06/26/15 0431 06/26/15 0933  BP: 125/74 119/72 123/69 118/62  Pulse: 71 90 76 74  Temp: 97.9 F (36.6 C) 98.1 F (36.7 C) 98.4 F (36.9 C)   TempSrc: Oral Oral Oral   Resp: Height:      Weight:      SpO2: 98% 95% 92%     General: Pt is alert, follows commands  appropriately, not in acute distress Cardiovascular: Regular rate and rhythm, no rubs, no gallops Respiratory: Clear to auscultation bilaterally, no wheezing, diminished breath sounds at bases  Abdominal: Soft, non tender, non distended, bowel sounds +, no guarding Extremities: chronic bilateral LE venous stasis changes  Discharge Instructions    Medication List    TAKE these medications        ciprofloxacin 500 MG tablet  Commonly known as:  CIPRO  Take 1 tablet (500 mg total) by mouth 2 (two) times daily. Continue taking for total 5 weeks     clonazePAM 0.5 MG tablet  Commonly known as:  KLONOPIN  Take 1 tablet (0.5 mg total) by mouth 3 (three) times daily.     docusate sodium 100 MG capsule  Commonly known as:  COLACE  Take 1 capsule (100 mg total) by mouth 2 (two) times daily.     famotidine 20 MG tablet  Commonly known as:  PEPCID  Take 20 mg by mouth daily.     Fish Oil 1000 MG Caps  Take 1,000 mg by mouth daily.     folic acid 1 MG tablet  Commonly known as:  FOLVITE  Take 1 mg by mouth daily.     furosemide 40 MG tablet  Commonly known as:  LASIX  Take 1 tablet (40 mg total) by mouth daily.     hydrALAZINE 25 MG tablet  Commonly known as:  APRESOLINE  Take 37.5 mg by mouth 2 (two) times daily.     ipratropium-albuterol 0.5-2.5 (3) MG/3ML Soln  Commonly known as:  DUONEB  Take 3 mLs by nebulization every 6 (six) hours as needed.     isosorbide dinitrate 20 MG tablet  Commonly known as:  ISORDIL  Take 20 mg by mouth 2 (two) times daily.     lidocaine 5 %  Commonly known as:  LIDODERM  Place 1 patch onto the skin daily. Remove & Discard patch within 12 hours or as directed by MD To right shoulder     metoCLOPramide 10 MG tablet  Commonly known as:  REGLAN  Take 10 mg by mouth 4 (four) times daily -  before meals and at bedtime.     metolazone 5 MG tablet  Commonly known as:  ZAROXOLYN  Take 5 mg by mouth daily.     ondansetron 4 MG tablet   Commonly known as:  ZOFRAN  Take 4 mg by mouth every 6 (six) hours.     polyethylene glycol packet  Commonly known as:  MIRALAX / GLYCOLAX  Take 17 g by mouth daily.     potassium chloride SA 20 MEQ tablet  Commonly known as:  K-DUR,KLOR-CON  Take 2 tablets (40 mEq total) by mouth 4 (four) times daily.     sertraline 100 MG tablet  Commonly known as:  ZOLOFT  Take 100 mg by mouth daily.     warfarin 2.5 MG tablet  Commonly known as:  COUMADIN  Take 0.5 tablets (1.25 mg total) by mouth daily.          Medication List    TAKE these medications        ciprofloxacin 500 MG tablet  Commonly known as:  CIPRO  Take 1 tablet (500 mg total) by mouth 2 (two) times daily. Continue taking for total 5 weeks     clonazePAM 0.5 MG tablet  Commonly known as:  KLONOPIN  Take 1 tablet (0.5 mg total) by mouth 3 (three) times daily.     famotidine 20 MG tablet  Commonly known as:  PEPCID  Take 20 mg by mouth daily.     Fish Oil 1000 MG Caps  Take 1,000 mg by mouth daily.     folic acid 1 MG tablet  Commonly known as:  FOLVITE  Take 1 mg by mouth daily.     furosemide 40 MG tablet  Commonly known as:  LASIX  Take 1 tablet (40 mg total) by mouth daily.     hydrALAZINE 25 MG tablet  Commonly known as:  APRESOLINE  Take 37.5 mg by mouth 2 (two) times daily.     ipratropium-albuterol 0.5-2.5 (3) MG/3ML Soln  Commonly known as:  DUONEB  Take 3 mLs by nebulization every 6 (six) hours as needed.     isosorbide dinitrate 20 MG tablet  Commonly known as:  ISORDIL  Take 20 mg by mouth 2 (two) times daily.     lidocaine 5 %  Commonly known as:  LIDODERM  Place 1 patch onto the skin daily. Remove & Discard patch within 12 hours or as directed by MD To right shoulder     metoCLOPramide 10 MG tablet  Commonly known as:  REGLAN  Take 10 mg by mouth 4 (four) times daily -  before meals and at bedtime.     metolazone 5 MG tablet  Commonly known as:  ZAROXOLYN  Take 5 mg by mouth  daily.     ondansetron 4 MG tablet  Commonly known as:  ZOFRAN  Take 4 mg by mouth every 6 (six) hours.     potassium chloride SA 20 MEQ tablet  Commonly known as:  K-DUR,KLOR-CON  Take 2 tablets (40 mEq total) by mouth 4 (four) times daily.     sertraline 100 MG tablet  Commonly known as:  ZOLOFT  Take 100 mg by mouth daily.     warfarin 2.5 MG tablet  Commonly known as:  COUMADIN  Take 0.5 tablets (1.25 mg total) by mouth daily.           Follow-up Information    Follow up with Ruben Im, MD.   Specialty:  Internal Medicine   Contact information:   [address] [place] [address] [phone]       Follow up with Hildred Laser, MD In 2 weeks.   Specialty:  Urology   Why:  Urology Follow up   Contact information:   [address] [place] [address] [phone]        The results of significant diagnostics from this hospitalization (including imaging, microbiology, ancillary and laboratory) are listed below for reference.     Microbiology: Recent Results (from the past 240 hour(s))  Culture, Urine     Status: None   Collection Time: 06/17/15  9:04 PM  Result Value Ref Range Status   Specimen Description URINE, CLEAN CATCH  Final   Special Requests NONE  Final   Culture   Final    >=100,000 COLONIES/mL ESCHERICHIA COLI Performed at [facility]    Report Status 06/20/2015 FINAL  Final   Organism ID, Bacteria ESCHERICHIA COLI  Final      Susceptibility  Escherichia coli - MIC*    AMPICILLIN <=2 SENSITIVE Sensitive     CEFAZOLIN <=4 SENSITIVE Sensitive     CEFTRIAXONE <=1 SENSITIVE Sensitive     CIPROFLOXACIN <=0.25 SENSITIVE Sensitive     GENTAMICIN <=1 SENSITIVE Sensitive     IMIPENEM <=0.25 SENSITIVE Sensitive     NITROFURANTOIN <=16 SENSITIVE Sensitive     TRIMETH/SULFA <=20 SENSITIVE Sensitive     AMPICILLIN/SULBACTAM <=2 SENSITIVE Sensitive     PIP/TAZO <=4 SENSITIVE Sensitive     * >=100,000 COLONIES/mL ESCHERICHIA  COLI  Culture, blood (routine x 2)     Status: None   Collection Time: 06/17/15  9:24 PM  Result Value Ref Range Status   Specimen Description BLOOD LEFT ARM  Final   Special Requests BOTTLES DRAWN AEROBIC AND ANAEROBIC 10 CC  Final   Culture   Final    NO GROWTH 5 DAYS Performed at [facility]    Report Status 06/23/2015 FINAL  Final  Culture, blood (routine x 2)     Status: None   Collection Time: 06/17/15  9:37 PM  Result Value Ref Range Status   Specimen Description BLOOD BLOOD LEFT FOREARM  Final   Special Requests IN PEDIATRIC BOTTLE 4 CC  Final   Culture  Setup Time   Final    GRAM NEGATIVE RODS IN PEDIATRIC BOTTLE CRITICAL RESULT CALLED TO, READ BACK BY AND VERIFIED WITH: SOPHIA @0611  06/19/15 MKELLY    Culture   Final    ESCHERICHIA COLI Performed at [facility]    Report Status 06/21/2015 FINAL  Final   Organism ID, Bacteria ESCHERICHIA COLI  Final      Susceptibility   Escherichia coli - MIC*    AMPICILLIN <=2 SENSITIVE Sensitive     CEFAZOLIN <=4 SENSITIVE Sensitive     CEFEPIME <=1 SENSITIVE Sensitive     CEFTAZIDIME <=1 SENSITIVE Sensitive     CEFTRIAXONE <=1 SENSITIVE Sensitive     CIPROFLOXACIN <=0.25 SENSITIVE Sensitive     GENTAMICIN <=1 SENSITIVE Sensitive     IMIPENEM <=0.25 SENSITIVE Sensitive     TRIMETH/SULFA <=20 SENSITIVE Sensitive     AMPICILLIN/SULBACTAM <=2 SENSITIVE Sensitive     * ESCHERICHIA COLI  Culture, routine-abscess     Status: None   Collection Time: 06/19/15  5:11 PM  Result Value Ref Range Status   Specimen Description PROSTATE  Final   Special Requests NONE  Final   Gram Stain   Final    ABUNDANT WBC PRESENT,BOTH PMN AND MONONUCLEAR NO SQUAMOUS EPITHELIAL CELLS SEEN RARE GRAM NEGATIVE RODS Performed at [facility]    Culture   Final    MODERATE ESCHERICHIA COLI Performed at [facility]    Report Status 06/22/2015 FINAL  Final   Organism ID, Bacteria ESCHERICHIA COLI  Final       Susceptibility   Escherichia coli - MIC*    AMPICILLIN <=2 SENSITIVE Sensitive     AMPICILLIN/SULBACTAM <=2 SENSITIVE Sensitive     CEFEPIME <=1 SENSITIVE Sensitive     CEFTAZIDIME <=1 SENSITIVE Sensitive     CEFTRIAXONE <=1 SENSITIVE Sensitive     CIPROFLOXACIN <=0.25 SENSITIVE Sensitive     GENTAMICIN <=1 SENSITIVE Sensitive     IMIPENEM <=0.25 SENSITIVE Sensitive     PIP/TAZO <=4 SENSITIVE Sensitive     TOBRAMYCIN <=1 SENSITIVE Sensitive     TRIMETH/SULFA Value in next row Sensitive      <=20 SENSITIVE(NOTE)    * MODERATE ESCHERICHIA COLI  Culture, blood (routine x 2)     Status: None   Collection Time: 06/20/15  1:55 PM  Result Value Ref Range Status   Specimen Description BLOOD LEFT WRIST  Final   Special Requests BOTTLES DRAWN AEROBIC AND ANAEROBIC 10CC EACH  Final   Culture   Final    NO GROWTH 5 DAYS Performed at [facility]    Report Status 06/25/2015 FINAL  Final  Culture, blood (routine x 2)     Status: None   Collection Time: 06/20/15  2:05 PM  Result Value Ref Range Status   Specimen Description BLOOD LEFT ARM  Final   Special Requests BOTTLES DRAWN AEROBIC AND ANAEROBIC 10CC EACH  Final   Culture   Final    NO GROWTH 5 DAYS Performed at [facility]    Report Status 06/25/2015 FINAL  Final     Labs: Basic Metabolic Panel:  Recent Labs Lab 06/21/15 0500 06/22/15 0415 06/23/15 0517 06/24/15 0428 06/25/15 0427  NA 134* 133* 137 137 135  K 2.9* 3.4* 3.2* 3.1* 3.3*  CL 94* 90* 93* 91* 90*  CO2 GLUCOSE 126* 114* 126* 119* 128*  BUN 14 19 28* 32* 36*  CREATININE 0.78 0.95 1.23 1.12 1.23  CALCIUM 9.6 9.6 10.2 10.2 10.3  MG  --   --   --   --  2.1     Recent Labs Lab 06/21/15 0500 06/22/15 0415 06/23/15 0517 06/24/15 0428 06/25/15 0427  WBC 11.0* 11.7* 11.8* 11.4* 11.4*  HGB 12.1* 13.1 13.4 13.3 13.1  HCT 37.2* 40.3 41.4 40.9 41.1  MCV 85.1 85.4 86.6 86.3 86.7  PLT 373 420* 414* 418* 461*   BNP (last 3  results)  Recent Labs  12/28/14 2150 01/02/15 1130 02/17/15 0608  BNP 55.7 55.9 21.9   CBG:  Recent Labs Lab 06/19/15 1117  GLUCAP 143*     SIGNED: Time coordinating discharge: 30 minutes  Debbora Presto, MD  [facility] 06/26/2015, 10:26 AM Pager [phone]  If 7PM-7AM, please contact night-coverage [email or web address] Password [email or web address]

## 2015-06-26 NOTE — Progress Notes (Signed)
RT talked to patient about taking off his passy muir valve while he sleeps and patient said no. He wants to keep it on.

## 2015-06-26 NOTE — Progress Notes (Signed)
[facility] auth rec'd.       . Patient agreeable to this plan- he declines me contacting anyone; stating, " I don't have any family".  Will plan transfer via EMS. Reece LevyJanet Omri Bertran, MSW, Theresia MajorsLCSWA

## 2015-07-01 ENCOUNTER — Non-Acute Institutional Stay (SKILLED_NURSING_FACILITY): Payer: No Typology Code available for payment source | Admitting: Adult Health

## 2015-07-01 DIAGNOSIS — I82501 Chronic embolism and thrombosis of unspecified deep veins of right lower extremity: Secondary | ICD-10-CM

## 2015-07-01 DIAGNOSIS — J9611 Chronic respiratory failure with hypoxia: Secondary | ICD-10-CM | POA: Diagnosis not present

## 2015-07-01 DIAGNOSIS — F418 Other specified anxiety disorders: Secondary | ICD-10-CM | POA: Diagnosis not present

## 2015-07-01 DIAGNOSIS — Z930 Tracheostomy status: Secondary | ICD-10-CM

## 2015-07-01 DIAGNOSIS — I5032 Chronic diastolic (congestive) heart failure: Secondary | ICD-10-CM | POA: Diagnosis not present

## 2015-07-01 DIAGNOSIS — N412 Abscess of prostate: Secondary | ICD-10-CM | POA: Diagnosis not present

## 2015-07-01 DIAGNOSIS — E662 Morbid (severe) obesity with alveolar hypoventilation: Secondary | ICD-10-CM | POA: Diagnosis not present

## 2015-07-01 DIAGNOSIS — I10 Essential (primary) hypertension: Secondary | ICD-10-CM

## 2015-07-02 ENCOUNTER — Non-Acute Institutional Stay (SKILLED_NURSING_FACILITY): Payer: PRIVATE HEALTH INSURANCE | Admitting: Internal Medicine

## 2015-07-02 ENCOUNTER — Encounter: Payer: Self-pay | Admitting: Internal Medicine

## 2015-07-02 DIAGNOSIS — J449 Chronic obstructive pulmonary disease, unspecified: Secondary | ICD-10-CM

## 2015-07-02 DIAGNOSIS — Z930 Tracheostomy status: Secondary | ICD-10-CM

## 2015-07-02 DIAGNOSIS — F418 Other specified anxiety disorders: Secondary | ICD-10-CM

## 2015-07-02 DIAGNOSIS — R5381 Other malaise: Secondary | ICD-10-CM

## 2015-07-02 DIAGNOSIS — I82403 Acute embolism and thrombosis of unspecified deep veins of lower extremity, bilateral: Secondary | ICD-10-CM

## 2015-07-02 DIAGNOSIS — N412 Abscess of prostate: Secondary | ICD-10-CM

## 2015-07-02 DIAGNOSIS — E11628 Type 2 diabetes mellitus with other skin complications: Secondary | ICD-10-CM

## 2015-07-02 DIAGNOSIS — I10 Essential (primary) hypertension: Secondary | ICD-10-CM

## 2015-07-02 DIAGNOSIS — I5032 Chronic diastolic (congestive) heart failure: Secondary | ICD-10-CM

## 2015-07-02 DIAGNOSIS — J9611 Chronic respiratory failure with hypoxia: Secondary | ICD-10-CM

## 2015-07-02 NOTE — Progress Notes (Signed)
Patient ID: [name], male   DOB: Oct 22, 1950, 64 y.o.   MRN: [id number]    HISTORY AND PHYSICAL   DATE: 07/02/15  Location:  [facility]    Place of Service: SNF [phone])   Extended Emergency Contact Information Primary Emergency Contact: Siefring,William Address: [address]          [place], [place] [address] [place] Home Phone: [phone] Mobile Phone: [phone] Relation: Brother Secondary Emergency Contact: Sneed,Charles Address: [place], [place] [address] [place] of [place] Home Phone: [phone] Mobile Phone: [phone] Relation: Friend  Advanced Directive information  FULL CODE  Chief Complaint  Patient presents with  . Readmit To SNF    HPI:  64 yo male seen today as a readmission into SNF following hospital stay for prostate abscess s/p surgical debridement and drainage on 10/12th, E coli Urosepsis. He presented to ED with LLQ pain for 4 days, dysuria, urinary urgency and difficulty emptying bladder. CT abd/pelvis revealed peripherally enhancing fluid collection or collections centered about the right-side of the prostate and pelvic cul-de-sac, suspicious for abscess/ abscesses, air within the urinary bladder and left renal collecting system/ureter suspicious for ascending infection and fluid-filled large and small bowel loops are suspicious for adynamic ileus. Drain removed 10/17. Ileus noted on xray 10/17th that was treated conservatively  He has no c/o today. He feels tired but just completed PT. Appetite ok. Sleeping well. No nursing issues. He will complete cipro 11/23rd  HTN/CHF/hyperlipidemia - BP stable on hydralazine, imdur, lasix w KCl, metolazine  DM - diet controlled. CBG 90-110s.   COPD/OSA/trach status - stable on duonebs  Anxiety - stable on klonopin and zoloft  GERD - stable on pepcid, reglan and prn zofran  DVT/IVC filter - on coumadin tx. INR therapeutic  Past Medical History    Diagnosis Date  . Arthritis   . RCT (rotator cuff tear)   . Multiple abrasions     rt arm  . Morbid obesity (HCC)   . Hypertension   . Hyperlipidemia   . COPD (chronic obstructive pulmonary disease) (HCC)   . CHF (congestive heart failure) [facility])     Past Surgical History  Procedure Laterality Date  . No past surgeries    . Shoulder arthroscopy with subacromial decompression, rotator cuff repair and bicep tendon repair Right 12/28/2014    Procedure: RIGHT SHOULDER ARTHROSCOPY WITH SUBACROMIAL DECOMPRESSION, DISTAL CLAVICLE RESECTION, ROTATOR CUFF REPAIR ;  Surgeon: Eugenia Mcalpine, MD;  Location: [facility] ORS;  Service: Orthopedics;  Laterality: Right;  . Tracheostomy tube placement N/A 01/16/2015    Procedure: TRACHEOSTOMY;  Surgeon: Flo Shanks, MD;  Location: [facility] OR;  Service: ENT;  Laterality: N/A;  . Tracheostomy revision N/A 01/18/2015    Procedure: TRACHEOSTOMY REVISION;  Surgeon: Melvenia Beam, MD;  Location: [facility] OR;  Service: ENT;  Laterality: N/A;  . Tracheostomy tube placement N/A 01/22/2015    Procedure: TRACHEOSTOMY;  Surgeon: Flo Shanks, MD;  Location: [facility] OR;  Service: ENT;  Laterality: N/A;  . [id number]      Patient Care Team: Ruben Im, MD as PCP - General (Internal Medicine)  Social History   Social History  . Marital Status: Single    Spouse Name: N/A  . Number of Children: N/A  . Years of Education: N/A   Occupational History  . Not on file.   Social History Main Topics  . Smoking status: Warning Every Day Smoker -- 0.20 packs/day  Types: Cigarettes  . Smokeless tobacco: Not on file  . Alcohol Use: No  . Drug Use: No  . Sexual Activity: Not on file   Other Topics Concern  . Not on file   Social History Narrative     reports that he has been smoking Cigarettes.  He has been smoking about 0.20 packs per day. He does not have any smokeless tobacco history on file. He reports that he does not drink alcohol or use illicit drugs.  Family History  Problem  Relation Age of Onset  . Heart disease Mother   . Heart attack Mother   . Heart disease Brother    Family Status  Relation Status Death Age  . Mother Deceased   . Father Deceased   . Sister Alive   . Brother Alive   . Brother Deceased     Immunization History  Administered Date(s) Administered  . Pneumococcal Polysaccharide-23 01/14/2015    No Known Allergies  Medications: Patient's Medications  New Prescriptions   No medications on file  Previous Medications   CIPROFLOXACIN (CIPRO) 500 MG TABLET    Take 1 tablet (500 mg total) by mouth 2 (two) times daily. Continue taking for total 5 weeks   CLONAZEPAM (KLONOPIN) 0.5 MG TABLET    Take 1 tablet (0.5 mg total) by mouth 3 (three) times daily.   DOCUSATE SODIUM (COLACE) 100 MG CAPSULE    Take 1 capsule (100 mg total) by mouth 2 (two) times daily.   FAMOTIDINE (PEPCID) 20 MG TABLET    Take 20 mg by mouth daily.   FOLIC ACID (FOLVITE) 1 MG TABLET    Take 1 mg by mouth daily.   FUROSEMIDE (LASIX) 40 MG TABLET    Take 1 tablet (40 mg total) by mouth daily.   HYDRALAZINE (APRESOLINE) 25 MG TABLET    Take 37.5 mg by mouth 2 (two) times daily.   IPRATROPIUM-ALBUTEROL (DUONEB) 0.5-2.5 (3) MG/3ML SOLN    Take 3 mLs by nebulization every 6 (six) hours as needed.   ISOSORBIDE DINITRATE (ISORDIL) 20 MG TABLET    Take 20 mg by mouth 2 (two) times daily.   LIDOCAINE (LIDODERM) 5 %    Place 1 patch onto the skin daily. Remove & Discard patch within 12 hours or as directed by MD To right shoulder   METOCLOPRAMIDE (REGLAN) 10 MG TABLET    Take 10 mg by mouth 4 (four) times daily -  before meals and at bedtime.   METOLAZONE (ZAROXOLYN) 5 MG TABLET    Take 5 mg by mouth daily.   OMEGA-3 FATTY ACIDS (FISH OIL) 1000 MG CAPS    Take 1,000 mg by mouth daily.    ONDANSETRON (ZOFRAN) 4 MG TABLET    Take 4 mg by mouth every 6 (six) hours.   POLYETHYLENE GLYCOL (MIRALAX / GLYCOLAX) PACKET    Take 17 g by mouth daily.   POTASSIUM CHLORIDE SA  (K-DUR,KLOR-CON) 20 MEQ TABLET    Take 2 tablets (40 mEq total) by mouth 4 (four) times daily.   SERTRALINE (ZOLOFT) 100 MG TABLET    Take 100 mg by mouth daily.   WARFARIN (COUMADIN) 2.5 MG TABLET    Take 0.5 tablets (1.25 mg total) by mouth daily.  Modified Medications   No medications on file  Discontinued Medications   No medications on file    Review of Systems  Constitutional: Positive for fatigue. Negative for fever, chills and activity change.  HENT: Negative for sore throat and  trouble swallowing.   Eyes: Negative for visual disturbance.  Respiratory: Positive for shortness of breath. Negative for cough and chest tightness.   Cardiovascular: Negative for chest pain, palpitations and leg swelling.  Gastrointestinal: Negative for nausea, vomiting, abdominal pain and blood in stool.  Genitourinary: Negative for urgency, frequency and difficulty urinating.  Musculoskeletal: Positive for arthralgias and gait problem.  Skin: Negative for rash.  Neurological: Positive for weakness. Negative for headaches.  Psychiatric/Behavioral: Negative for confusion and sleep disturbance. The patient is not nervous/anxious.     Filed Vitals:   07/02/15 1558  BP: 120/70  Pulse: 70  Temp: 97.5 F (36.4 C)  Weight: 243 lb (110.224 kg)  SpO2: 93%   Body mass index is 32.95 kg/(m^2).  Physical Exam  Constitutional: He appears well-developed and well-nourished. No distress.  Looks tired in NAD. Sitting in w/c  HENT:  Mouth/Throat: Oropharynx is clear and moist.  Eyes: Pupils are equal, round, and reactive to light. No scleral icterus.  Neck: Neck supple. Carotid bruit is not present.  Trach present. No purulent d/c  Cardiovascular: Normal rate, regular rhythm, normal heart sounds and intact distal pulses.  Exam reveals no gallop and no friction rub.   No murmur heard. no distal LE swelling. No calf TTP  Pulmonary/Chest: Effort normal and breath sounds normal. He has no wheezes. He has no  rales. He exhibits no tenderness.  Reduced BS at base b/l  Abdominal: Soft. Bowel sounds are normal. He exhibits no distension, no abdominal bruit, no pulsatile midline mass and no mass. There is no tenderness. There is no rebound and no guarding.  Musculoskeletal: He exhibits edema and tenderness.  Lymphadenopathy:    He has no cervical adenopathy.  Neurological: He is alert.  Skin: Skin is warm and dry. Rash (chronic venous stasis changes b/l LE. skin hyperpigmented) noted.  Psychiatric: He has a normal mood and affect. His behavior is normal. Thought content normal.     Labs reviewed: Admission on 06/17/2015, Discharged on 06/26/2015  No results displayed because visit has over 200 results.    Office Visit on 06/13/2015  Component Date Value Ref Range Status  . INR 06/13/2015 6.8   Final   Results given to [facility] by [facility]   . Glucose 06/13/2015 127* 65 - 99 mg/dL Final  . BUN 16/06/9603 28* 8 - 27 mg/dL Final  . Creatinine, Ser 06/13/2015 1.14  0.76 - 1.27 mg/dL Final  . GFR calc non Af Amer 06/13/2015 69  >59 mL/min/1.73 Final  . GFR calc Af Amer 06/13/2015 79  >59 mL/min/1.73 Final  . BUN/Creatinine Ratio 06/13/2015 25* 10 - 22 Final  . Sodium 06/13/2015 135  134 - 144 mmol/L Final   Comment: **Effective June 24, 2015 the reference interval**   for Sodium, Serum will be changing to:                                             136 - 144   . Potassium 06/13/2015 3.7  3.5 - 5.2 mmol/L Final   Comment: **Effective June 24, 2015 the reference interval**   for Potassium, Serum will be changing to:                          0 -  7 days        3.7 -  5.2                          8 - 30 days        3.7 - 6.4                          1 -  6 months      3.8 - 6.0                   7 months -  1 year        3.8 - 5.3                              >1 year        3.5 - 5.2   . Chloride 06/13/2015 96* 97 - 108 mmol/L Final   Comment: **Effective June 24, 2015 the reference  interval**   for Chloride, Serum will be changing to:                                              97 - 106   . CO2 06/13/2015 20  18 - 29 mmol/L Final  . Anion Gap 06/13/2015 19.0* 10.0 - 18.0 mmol/L Final  . Calcium 06/13/2015 9.0  8.6 - 10.2 mg/dL Final  Hospital Outpatient Visit on 05/07/2015  Component Date Value Ref Range Status  . Prothrombin Time 05/07/2015 21.5* 11.6 - 15.2 seconds Final  . INR 05/07/2015 1.88* 0.00 - 1.49 Final    Dg Chest 2 View  06/21/2015  CLINICAL DATA:  Dyspnea. EXAM: CHEST  2 VIEW COMPARISON:  June 17, 2015. FINDINGS: Stable cardiomediastinal silhouette. Endotracheal tube is in grossly good position. No pneumothorax or pleural effusion is noted. Both lungs are clear. The visualized skeletal structures are unremarkable. IMPRESSION: No active cardiopulmonary disease. Electronically Signed   By: Lupita Raider, M.D.   On: 06/21/2015 14:36   Ct Abdomen Pelvis W Contrast  06/24/2015  CLINICAL DATA:  Acute left lower quadrant abdominal pain. EXAM: CT ABDOMEN AND PELVIS WITH CONTRAST TECHNIQUE: Multidetector CT imaging of the abdomen and pelvis was performed using the standard protocol following bolus administration of intravenous contrast. CONTRAST:  OMNIPAQUE IOHEXOL 300 MG/ML  SOLN COMPARISON:  CT scan of June 17, 2015. FINDINGS: Visualized lung bases are unremarkable. No significant osseous abnormality is noted. No gallstones are noted. Fatty infiltration of the liver is noted with sparing around the gallbladder fossa. The spleen and pancreas unremarkable. Duodenum diverticulum is noted. Adrenal glands appear normal. Bilateral nephrolithiasis is noted. No hydronephrosis or renal obstruction is noted. IVC filter is noted in infrarenal position. Atherosclerosis of abdominal aorta is noted without aneurysm formation. Appendicolith is noted in the appendix; otherwise the appendix appears normal. There is no evidence of bowel obstruction. Sigmoid  diverticulosis is noted without inflammation. Urinary bladder appears normal. There is been interval placement of drainage catheter into the right side of the pelvis using trans gluteal approach. Fluid collection noted to the right and inferior to prostate gland on prior exam has been completely decompressed. No significant adenopathy is noted. IMPRESSION: Fatty infiltration of the liver. Bilateral nonobstructive nephrolithiasis. Atherosclerosis of abdominal aorta without aneurysm formation. Sigmoid diverticulosis is noted without inflammation. Interval placement of  transgluteal drainage catheter into right side of the pelvis. Fluid collection noted adjacent to prostate gland on prior exam has been completely decompressed. Electronically Signed   By: Lupita Raider, M.D.   On: 06/24/2015 13:05   Ct Abdomen Pelvis W Contrast  06/17/2015  CLINICAL DATA:  Nausea and vomiting. Back pain and swelling in legs. Possible urine stool incontinence. Morbid obesity. COPD. Congestive heart failure. EXAM: CT ABDOMEN AND PELVIS WITH CONTRAST TECHNIQUE: Multidetector CT imaging of the abdomen and pelvis was performed using the standard protocol following bolus administration of intravenous contrast. CONTRAST:  OMNIPAQUE IOHEXOL 300 MG/ML  SOLN COMPARISON:  01/28/2015 FINDINGS: Lower chest: Motion degradation throughout. Grossly clear lung bases. Mild cardiomegaly. Multivessel coronary artery atherosclerosis. Hepatobiliary: Mild hepatic steatosis, without focal liver lesion. Normal gallbladder, without biliary ductal dilatation. Pancreas: Normal, without mass or ductal dilatation. Periampullary duodenal diverticulum. Spleen: Normal in size, without focal abnormality. Adrenals/Urinary Tract: Normal adrenal glands. Bilateral renal collecting system calculi. Largest stone is in the interpolar left kidney at 11 mm. Too small to characterize interpolar left renal lesion. A lower pole right renal lesion measures 13 mm and is  likely a cyst. Air within the left renal collecting system and left ureter proximally. There is edema along the course of the left ureter, including at the left pelvic brim on image/series 74/2. Air is identified within the urinary bladder. No hydroureter or ureteric stone seen. Stomach/Bowel: Mild gastric distension. Descending duodenal diverticulum. Normal terminal ileum and appendix. Small bowel loops are fluid-filled, measuring up to 3.7 cm. No transition point is identified. No pneumatosis or free intraperitoneal air. Colon is gas and fluid filled. Scattered colonic diverticula. No colonic wall thickening. Vascular/Lymphatic: Aortic and branch vessel atherosclerosis. Small retroperitoneal nodes are not pathologic by size criteria. IVC filter is appropriately positioned. No pelvic adenopathy. Reproductive: Multiloculated fluid collection or collections centered about the right-side of the prostate and pelvic cul-de-sac. In conglomerate, measure on the order of 5.5 x 5.9 cm on image/series 94/2. Other: No significant free fluid. Musculoskeletal: Degenerative partial fusion of the bilateral sacroiliac joints. IMPRESSION: 1. Peripherally enhancing fluid collection or collections centered about the right-side of the prostate and pelvic cul-de-sac. Suspicious for abscess/ abscesses. 2. Air within the urinary bladder and left renal collecting system/ureter. Suspicious for ascending infection. Edema in the left hemipelvis, along the course of the left ureter, is likely secondary. 3. Fluid-filled large and small bowel loops are suspicious for adynamic ileus. No focal transition point identified. 4. Bilateral nephrolithiasis. 5.  Atherosclerosis, including within the coronary arteries. 6. Mild motion degradation. These results were called by telephone at the time of interpretation on 06/17/2015 at 5:35 pm to PA. SHARI UPSTILL , who verbally acknowledged these results. Electronically Signed   By: Jeronimo Greaves M.D.   On:  06/17/2015 17:35   Dg Chest Port 1 View  06/17/2015  CLINICAL DATA:  Cough for 1 day. EXAM: PORTABLE CHEST 1 VIEW COMPARISON:  02/21/2015 and prior radiographs FINDINGS: Cardiomegaly and mild pulmonary vascular congestion noted. An endotracheal tube is present with tip 7 cm above the carina. There is no evidence of focal airspace disease, pulmonary edema, suspicious pulmonary nodule/mass, pleural effusion, or pneumothorax. No acute bony abnormalities are identified. IMPRESSION: Cardiomegaly with mild pulmonary vascular congestion. Electronically Signed   By: Harmon Pier M.D.   On: 06/17/2015 19:32   Dg Abd 2 Views  06/25/2015  CLINICAL DATA:  Abdominal pain, weakness, and nausea day. EXAM: ABDOMEN - 2 VIEW COMPARISON:  Yesterday  FINDINGS: Generalized distention of all small enlarged bowel is present as well as the stomach in an ileus pattern. There is no free intraperitoneal gas. Small and large bowel air-fluid levels are present. IVC filter remains in place. Bilateral nephrolithiasis. IMPRESSION: Moderate ileus pattern. Electronically Signed   By: Jolaine Click M.D.   On: 06/25/2015 10:49   Dg Abd 2 Views  06/24/2015  CLINICAL DATA:  Prostate abscess. EXAM: ABDOMEN - 2 VIEW COMPARISON:  CT 06/19/2015 FINDINGS: Drainage catheter in the pelvis again noted. This appears in stable position from prior CT. Dilated loops of small and large bowel. Findings most consistent adynamic ileus. Cecum measures 13 cm in diameter. Stable right nephrolithiasis. IVC filter in stable position. No acute abnormality. IMPRESSION: 1. Drainage catheter within previously identified prostate abscess in stable position. 2. Dilated loops of small and large bowel consistent with adynamic ileus. The cecum measures 13 cm. No cecal wall thickening noted. Follow-up abdominal series suggested to demonstrate resolution. These results will be called to the ordering clinician or representative by the Radiologist Assistant, and communication  documented in the PACS or zVision Dashboard. Electronically Signed   By: Maisie Fus  Register   On: 06/24/2015 09:40   Ct Image Guided Drainage Percut Cath  Peritoneal Retroperit  06/19/2015  CLINICAL DATA:  64 year old with a prostate abscess. Request for image guided drainage. EXAM: CT-GUIDED DRAINAGE OF PROSTATE ABSCESS Physician: Rachelle Hora. Lowella Dandy, MD FLUOROSCOPY TIME:  None MEDICATIONS: 2 mg Versed, 50 mcg fentanyl. A radiology nurse monitored the patient for moderate sedation. ANESTHESIA/SEDATION: Moderate sedation time: 20 minutes PROCEDURE: The procedure was explained to the patient. The risks and benefits of the procedure were discussed and the patient's questions were addressed. Informed consent was obtained from the patient. The patient was placed prone and CT images through the pelvis were obtained. The fluid collection along the right side of the prostate was targeted. Gluteal region was prepped with Betadine and a sterile field was created. Skin was anesthetized with 1% lidocaine. An 18 gauge needle was directed into the fluid collection along the right side of the prostate. Yellow purulent fluid was aspirated. A stiff Amplatz wire was advanced into the collection. The tract was dilated to accommodate a 10 [place] multipurpose drain. Approximately 30 mL of purulent fluid was removed. Catheter was attached to a suction bulb and sutured in place. Dressing placed over the drain. FINDINGS: Fluid collection along the right side of the prostate compatible with an abscess. 30 mL of purulent fluid was removed. Estimated blood loss: Minimal COMPLICATIONS: None IMPRESSION: CT-guided drainage of the prostate abscess. Electronically Signed   By: Richarda Overlie M.D.   On: 06/19/2015 18:04     Assessment/Plan   ICD-9-CM ICD-10-CM   1. Prostate abscess s/p surgical debridement 601.2 N41.2   2. Physical deconditioning - multifactorial 799.3 R53.81   3. 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 DVT (deep venous thrombosis), bilateral s/p IVC filter; on coumadin 453.40 I82.403   5. Chronic respiratory failure with hypoxia (HCC) stable 518.83 J96.11    799.02    6. 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 Chronic obstructive pulmonary disease, unspecified COPD, unspecified chronic bronchitis type - stable 496 J44.9   8. Essential hypertension, benign - stable 401.1 I10   9. Chronic diastolic congestive heart failure (HCC) - stable 428.32 I50.32    428.0    10. Depression with anxiety -  stable 300.4 F41.8     Check CBC and BMP as ordered  Complete abx cipro (stop date 11/23rd)  Cont other meds as ordered  Follow INR closely 3 times weekly  Trach care as indicated  PT/OT as ordered  GOAL: short term rehab and d/c home when medically appropriate. Communicated with pt and nursing.  Will follow  Sharmel Ballantine S. Ancil Linseyarter, D. O., F. A. C. O. I.  [facility] [address] [place], [place] [address] [phone] Cell (Monday-Friday 8 AM - 5 PM) [phone] After 5 PM and follow prompts

## 2015-07-08 ENCOUNTER — Telehealth: Payer: Self-pay | Admitting: Professional Counselor

## 2015-07-08 NOTE — Telephone Encounter (Signed)
Patient has an open APS case and worker called [phone] regarding patient status.  [facility] [place]. APS following up with facility and patient.  Deretha EmoryHannah Jaquawn Saffran LCSW, MSW Clinical Social Work: Emergency Room [phone]

## 2015-07-09 ENCOUNTER — Emergency Department (HOSPITAL_COMMUNITY)
Admission: EM | Admit: 2015-07-09 | Discharge: 2015-07-09 | Disposition: A | Discharge status: Home / Self Care | Payer: No Typology Code available for payment source | Attending: Emergency Medicine | Admitting: Emergency Medicine

## 2015-07-09 ENCOUNTER — Encounter (HOSPITAL_COMMUNITY): Payer: Self-pay | Admitting: Emergency Medicine

## 2015-07-09 DIAGNOSIS — E785 Hyperlipidemia, unspecified: Secondary | ICD-10-CM | POA: Insufficient documentation

## 2015-07-09 DIAGNOSIS — I509 Heart failure, unspecified: Secondary | ICD-10-CM | POA: Insufficient documentation

## 2015-07-09 DIAGNOSIS — J449 Chronic obstructive pulmonary disease, unspecified: Secondary | ICD-10-CM | POA: Insufficient documentation

## 2015-07-09 DIAGNOSIS — M1990 Unspecified osteoarthritis, unspecified site: Secondary | ICD-10-CM | POA: Insufficient documentation

## 2015-07-09 DIAGNOSIS — Z79899 Other long term (current) drug therapy: Secondary | ICD-10-CM | POA: Insufficient documentation

## 2015-07-09 DIAGNOSIS — J9500 Unspecified tracheostomy complication: Secondary | ICD-10-CM

## 2015-07-09 DIAGNOSIS — Z87891 Personal history of nicotine dependence: Secondary | ICD-10-CM | POA: Insufficient documentation

## 2015-07-09 DIAGNOSIS — J9509 Other tracheostomy complication: Secondary | ICD-10-CM | POA: Diagnosis not present

## 2015-07-09 DIAGNOSIS — Z7901 Long term (current) use of anticoagulants: Secondary | ICD-10-CM | POA: Diagnosis not present

## 2015-07-09 DIAGNOSIS — I10 Essential (primary) hypertension: Secondary | ICD-10-CM | POA: Insufficient documentation

## 2015-07-09 NOTE — ED Notes (Signed)
[facility] ED via [facility] on [address]-- c/o coughed trach tube out at 9 pm, pt is alert, oriented, no resp distress, speaking in complete sentences.

## 2015-07-09 NOTE — ED Provider Notes (Signed)
CSN: [id number]     Arrival date & time 07/09/15  7829 History   First MD Initiated Contact with Patient 07/09/15 [phone]     Chief Complaint  Patient presents with  . Tracheostomy Tube Change     (Consider location/radiation/quality/duration/timing/severity/associated sxs/prior Treatment) HPI Comments: 64 year old male, complicated medical history since May of this year when he underwent elective rotator cuff surgery, had difficulty with extubation and has had a prolonged hospital stay several times for multiple different reasons including failure to extubate, chronic respiratory failure, needing tracheostomy, trach tube dislodgment, pneumothorax, acute renal failure requiring dialysis and bilateral DVTs. The patient currently has an IVC filter, he is on Coumadin, he has had a trach that has been placed and has been placed in a nursing facility where he currently gets his care. He has diffuse lower extremity weakness and is unable to ambulate thus he is full care. The patient states that approximately 12 hours ago he coughed causing dislodgment of his trach, he was found this morning by his staff at the facility cannot have his trach in and was sent over for evaluation. The patient states that he has been breathing fine all night, he has been breathing through his mouth without any difficulty, no coughing, no fever, no dyspnea. The symptoms are persistent, gradually improving, he denies any bleeding from his trach site.  The history is provided by the patient.    Past Medical History  Diagnosis Date  . Arthritis   . RCT (rotator cuff tear)   . Multiple abrasions     rt arm  . Morbid obesity (HCC)   . Hypertension   . Hyperlipidemia   . COPD (chronic obstructive pulmonary disease) (HCC)   . CHF (congestive heart failure) [facility])    Past Surgical History  Procedure Laterality Date  . No past surgeries    . Shoulder arthroscopy with subacromial decompression, rotator cuff repair and bicep  tendon repair Right 12/28/2014    Procedure: RIGHT SHOULDER ARTHROSCOPY WITH SUBACROMIAL DECOMPRESSION, DISTAL CLAVICLE RESECTION, ROTATOR CUFF REPAIR ;  Surgeon: Eugenia Mcalpine, MD;  Location: [facility];  Service: Orthopedics;  Laterality: Right;  . Tracheostomy tube placement N/A 01/16/2015    Procedure: TRACHEOSTOMY;  Surgeon: Flo Shanks, MD;  Location: [facility] OR;  Service: ENT;  Laterality: N/A;  . Tracheostomy revision N/A 01/18/2015    Procedure: TRACHEOSTOMY REVISION;  Surgeon: Melvenia Beam, MD;  Location: [facility] OR;  Service: ENT;  Laterality: N/A;  . Tracheostomy tube placement N/A 01/22/2015    Procedure: TRACHEOSTOMY;  Surgeon: Flo Shanks, MD;  Location: [facility] OR;  Service: ENT;  Laterality: N/A;  . [id number]     Family History  Problem Relation Age of Onset  . Heart disease Mother   . Heart attack Mother   . Heart disease Brother    Social History  Substance Use Topics  . Smoking status: Former Smoker -- 0.20 packs/day    Types: Cigarettes  . Smokeless tobacco: None  . Alcohol Use: No    Review of Systems  All other systems reviewed and are negative.     Allergies  Review of patient's allergies indicates no known allergies.  Home Medications   Prior to Admission medications   Medication Sig Start Date End Date Taking? Authorizing Provider  warfarin (COUMADIN) 2.5 MG tablet Take 0.5 tablets (1.25 mg total) by mouth daily. 06/26/15  Yes Dorothea Ogle, MD  ciprofloxacin (CIPRO) 500 MG tablet Take 1 tablet (500 mg total) by mouth 2 (two) times daily.  Continue taking for total 5 weeks Patient not taking: Reported on 07/09/2015 06/26/15   Dorothea Ogle, MD  clonazePAM (KLONOPIN) 0.5 MG tablet Take 1 tablet (0.5 mg total) by mouth 3 (three) times daily. 06/23/15   Dorothea Ogle, MD  docusate sodium (COLACE) 100 MG capsule Take 1 capsule (100 mg total) by mouth 2 (two) times daily. 06/26/15   Dorothea Ogle, MD  famotidine (PEPCID) 20 MG tablet Take 20 mg by mouth daily.    Historical  Provider, MD  folic acid (FOLVITE) 1 MG tablet Take 1 mg by mouth daily.    Historical Provider, MD  furosemide (LASIX) 40 MG tablet Take 1 tablet (40 mg total) by mouth daily. 06/13/15   Ruben Im, MD  hydrALAZINE (APRESOLINE) 25 MG tablet Take 37.5 mg by mouth 2 (two) times daily.    Historical Provider, MD  ipratropium-albuterol (DUONEB) 0.5-2.5 (3) MG/3ML SOLN Take 3 mLs by nebulization every 6 (six) hours as needed. For shortness of breath    Historical Provider, MD  isosorbide dinitrate (ISORDIL) 20 MG tablet Take 20 mg by mouth 2 (two) times daily.    Historical Provider, MD  lidocaine (LIDODERM) 5 % Place 1 patch onto the skin daily. Remove & Discard patch within 12 hours or as directed by MD To right shoulder    Historical Provider, MD  metoCLOPramide (REGLAN) 10 MG tablet Take 10 mg by mouth 4 (four) times daily -  before meals and at bedtime.    Historical Provider, MD  metolazone (ZAROXOLYN) 5 MG tablet Take 5 mg by mouth daily.    Historical Provider, MD  Omega-3 Fatty Acids (FISH OIL) 1000 MG CAPS Take 1,000 mg by mouth daily.     Historical Provider, MD  ondansetron (ZOFRAN) 4 MG tablet Take 4 mg by mouth every 6 (six) hours.    Historical Provider, MD  polyethylene glycol (MIRALAX / GLYCOLAX) packet Take 17 g by mouth daily. 06/26/15   Dorothea Ogle, MD  potassium chloride SA (K-DUR,KLOR-CON) 20 MEQ tablet Take 2 tablets (40 mEq total) by mouth 4 (four) times daily. 04/22/15   Sharee Holster, NP  sertraline (ZOLOFT) 100 MG tablet Take 100 mg by mouth daily.    Historical Provider, MD   BP 125/75 mmHg  Pulse 81  Temp(Src) 97.4 F (36.3 C) (Oral)  Resp 20  SpO2 95% Physical Exam  Constitutional: He appears well-developed and well-nourished. No distress.  HENT:  Head: Normocephalic and atraumatic.  Mouth/Throat: Oropharynx is clear and moist. No oropharyngeal exudate.  Eyes: Conjunctivae and EOM are normal. Pupils are equal, round, and reactive to light. Right eye exhibits no  discharge. Left eye exhibits no discharge. No scleral icterus.  Neck: Normal range of motion. Neck supple. No JVD present. No thyromegaly present.  Small 3mm opening in the neck - the trach site is clean - no drainage / bleeding / mucous.    Cardiovascular: Normal rate, regular rhythm, normal heart sounds and intact distal pulses.  Exam reveals no gallop and no friction rub.   No murmur heard. Pulmonary/Chest: Effort normal and breath sounds normal. No respiratory distress. He has no wheezes. He has no rales.  Abdominal: Soft. Bowel sounds are normal. He exhibits no distension and no mass. There is no tenderness.  Musculoskeletal: Normal range of motion. He exhibits no edema or tenderness.  Lymphadenopathy:    He has no cervical adenopathy.  Neurological: He is alert. Coordination normal.  Bilateral lower extremity weakness, symmetrical, bilateral  upper extremities have normal strength, cranial nerves III through XII are normal  Skin: Skin is warm and dry. No rash noted. No erythema.  Psychiatric: He has a normal mood and affect. His behavior is normal.  Nursing note and vitals reviewed.   ED Course  Procedures (including critical care time) Labs Review Labs Reviewed - No data to display  Imaging Review No results found. I have personally reviewed and evaluated these images and lab results as part of my medical decision-making.    MDM   Final diagnoses:  Tracheostomy complication, unspecified complication type (HCC)    The patient has normal vital signs, he is not complaining of any symptoms whatsoever and has a normal exam of his lungs and his heart. He is able to speak in full sentences, he has a very small amount of air movement through his trach site which has constricted down significantly. Due to his significant history of respiratory failure, we'll clarify with otolaryngology regarding need for possible dilation and replacement of trach versus allowing site to close. At this  time the patient appears stable  D/w Dr,. Wolicki - recommends pt go to the office immediately for evaluation - pt appears stable for this disposition at this time.  Filed Vitals:   07/09/15 0757 07/09/15 0800 07/09/15 0900 07/09/15 1009  BP: 108/83 109/77 123/94 125/75  Pulse: 80 87 82 81  Temp: 97.4 F (36.3 C)     TempSrc: Oral     Resp: 18   20  SpO2: 96% 96% 94% 95%     Eber HongBrian Trilby Way, MD 07/09/15 1053

## 2015-07-09 NOTE — ED Notes (Signed)
Pt being taken by Sharin MonsPTAR to MD ENT

## 2015-07-10 ENCOUNTER — Ambulatory Visit: Payer: Self-pay | Admitting: Internal Medicine

## 2015-07-12 NOTE — Progress Notes (Signed)
Patient ID: [name], male   DOB: March 05, 1951, 64 y.o.   MRN: [id number]    Facility: [facility]      No Known Allergies  Chief Complaint  Patient presents with  . Discharge Note    HPI:  He is being discharged to home with home health for pt/ot/rn/rt. He will need a bariatric rollator; trach care supplies; and neb machine. He will need his prescriptions to be written and will need to follow up with his pcp.    Past Medical History  Diagnosis Date  . Arthritis   . RCT (rotator cuff tear)   . Multiple abrasions     rt arm  . Morbid obesity (HCC)   . Hypertension   . Hyperlipidemia   . COPD (chronic obstructive pulmonary disease) (HCC)   . CHF (congestive heart failure) [facility])     Past Surgical History  Procedure Laterality Date  . No past surgeries    . Shoulder arthroscopy with subacromial decompression, rotator cuff repair and bicep tendon repair Right 12/28/2014    Procedure: RIGHT SHOULDER ARTHROSCOPY WITH SUBACROMIAL DECOMPRESSION, DISTAL CLAVICLE RESECTION, ROTATOR CUFF REPAIR ;  Surgeon: Eugenia Mcalpine, MD;  Location: [facility];  Service: Orthopedics;  Laterality: Right;  . Tracheostomy tube placement N/A 01/16/2015    Procedure: TRACHEOSTOMY;  Surgeon: Flo Shanks, MD;  Location: [facility] OR;  Service: ENT;  Laterality: N/A;  . Tracheostomy revision N/A 01/18/2015    Procedure: TRACHEOSTOMY REVISION;  Surgeon: Melvenia Beam, MD;  Location: [facility] OR;  Service: ENT;  Laterality: N/A;  . Tracheostomy tube placement N/A 01/22/2015    Procedure: TRACHEOSTOMY;  Surgeon: Flo Shanks, MD;  Location: [facility] OR;  Service: ENT;  Laterality: N/A;  . [id number]      VITAL SIGNS BP 132/70 mmHg  Pulse 78  Ht 6' (1.829 m)  Wt 309 lb (140.161 kg)  BMI 41.90 kg/m2  SpO2 96%  Patient's Medications  CLONAZEPAM (KLONOPIN) 0.5 MG TABLET    Take 0.5 mg by mouth 3 (three) times daily.  FAMOTIDINE (PEPCID) 20 MG TABLET    Take 20 mg by mouth daily.  FOLIC ACID (FOLVITE) 1 MG TABLET     Take 1 mg by mouth daily.  FUROSEMIDE (LASIX) 40 MG TABLET    Take 40 mg by mouth daily.  HYDRALAZINE (APRESOLINE) 25 MG TABLET    Take 37.5 mg by mouth 2 (two) times daily.  IPRATROPIUM-ALBUTEROL (DUONEB) 0.5-2.5 (3) MG/3ML SOLN    Take 3 mLs by nebulization every 6 (six) hours as needed.  ISOSORBIDE DINITRATE (ISORDIL) 20 MG TABLET    Take 20 mg by mouth 2 (two) times daily.  LIDOCAINE (LIDODERM) 5 %    Place 1 patch onto the skin daily. Remove & Discard patch within 12 hours or as directed by MD To right shoulder  MELATONIN 3 MG TABS    Take 9 mg by mouth at bedtime.  METOCLOPRAMIDE (REGLAN) 10 MG TABLET    Take 10 mg by mouth 4 (four) times daily -  before meals and at bedtime.  METOLAZONE (ZAROXOLYN) 5 MG TABLET    Take 5 mg by mouth daily.  MODAFINIL (PROVIGIL) 100 MG TABLET    Take 100 mg by mouth daily.  NICOTINE (NICODERM CQ - DOSED IN MG/24 HR) 7 MG/24HR PATCH    Place 7 mg onto the skin daily.  NUTRITIONAL SUPPLEMENTS (ADULT NUTRITIONAL SUPPLEMENT + PO)    Take 240 mLs by mouth 2 (two) times daily. 2-CAL SUPPLEMENT  OMEGA-3 FATTY  ACIDS (FISH OIL) 1000 MG CAPS    Take 1,000 mg by mouth daily.   ONDANSETRON (ZOFRAN) 4 MG TABLET    Take 4 mg by mouth every 6 (six) hours.  POLYCARBOPHIL (FIBERCON) 625 MG TABLET    Take 1,250 mg by mouth 3 (three) times daily.  POLYETHYLENE GLYCOL (MIRALAX / GLYCOLAX) PACKET    Take 17 g by mouth daily.  POTASSIUM CHLORIDE SA (K-DUR,KLOR-CON) 20 MEQ TABLET    Take 40 mEq by mouth 3 (three) times daily.   SERTRALINE (ZOLOFT) 100 MG TABLET    Take 100 mg by mouth daily.  WARFARIN (COUMADIN) 5 MG TABLET    Take 7 mg by mouth daily at 6 PM.     New Prescriptions   No medications on file  Previous Medications  Modified Medications   No medications on file  Discontinued Medications   No medications on file     SIGNIFICANT DIAGNOSTIC EXAMS   12-29-14: testicle ultrasound: Two solid extratesticular right scrotal masses. Primary  differential diagnosis for extratesticular masses include primary or metastatic neoplasms including adenomatoid tumor, fibroma, leiomyoma, mesothelioma, or sarcoma. Urology consultation is recommended. No sonographic evidence for testicular torsion or intratesticular mass.  01-01-15: 2-d echo: Left ventricle: The cavity size was normal. There was mild focal basal and mild concentric hypertrophy of the septum. Systolic function was normal. The estimated ejection fraction was in the range of 60% to 65%. Wall motion was normal; there were no regional wall motion abnormalities. Doppler parameters are consistent with abnormal left ventricular relaxation (grade 1 diastolic dysfunction). There was no evidence of elevate ventricular filling pressure by Doppler parameters. - Aortic valve: There was no regurgitation. - Aortic root: The aortic root was normal in size. - Mitral valve: Structurally normal valve. There was no regurgitation. - Right ventricle: Systolic function was normal. - Right atrium: The atrium was normal in size. - Tricuspid valve: There was trivial regurgitation. - Pulmonary arteries: Systolic pressure was within the normal range.   01-07-15: renal ultrasound: Limited examination demonstrating no acute abnormality. Negative for Hydronephrosis. Possible nonobstructing stone lower pole left kidney.  01-18-15: IVC filter placement: Successful IVC filter placement. This is temporary or can remain in place to become permanent.  01-28-15: ct of abdomen: 1. No significant colonic interposition between the anterior abdominal wall and stomach. 2. Hepatic steatosis. 3. Bilateral nephrolithiasis. Possible left UPJ stenosis without secondary signs of ureteral obstruction. 4. Cardiomegaly, bibasilar atelectasis and a small amount of retrosternal air, likely related to previous thoracic intervention.   01-28-15: ct of head: 1. No acute intracranial findings demonstrated. Mild atrophy and  periventricular white matter disease. 2. Bilateral mastoid effusions with opacification of the left middle ear.   LABS REVIEWED:   12-31-14: hgb a1c 7.5; 01-23-15: hgb a1c 7.2  01-31-15: vit b12: 304 02-13-15: hgb a1c 6.3 03-14-15: wbc 8.5; hgb 12.;1 hct 36.3; mcv 93.1; plt 251; glucose 112; bun 20; creat 0.82; k+3.0; na++135; phos 3.7; mag 1.2; albumin 3.2 03-25-15: wbc 8.5; hgb 11.7; hct 36.4; mcv 95.0; plt 240; glucose 111; bun 20; creat 1.01; k+3.4; na++138  04-01-15: glucose 112; bun 19; creat 0.97; k+3.3; na++139 04-03-15: inr 1.63 coumadin 6 mg 04-09-15: inr 2.16 coumadin 7 mg 04-15-15: inr 2.16 coumadin 7 mg 04-12-15: glucose 101; bun 20; creat 0.99; k+3.3; na++ 140      Review of Systems Constitutional: Negative for appetite change and fatigue.  HENT: Negative for congestion.   Respiratory: Negative for cough, chest tightness and shortness of breath.  Cardiovascular: Negative for chest pain, palpitations and leg swelling.  Gastrointestinal: Negative for nausea, abdominal pain, diarrhea and constipation.  Musculoskeletal: Negative for myalgias and arthralgias.  Skin: Negative for pallor.  Neurological: Negative for dizziness.  Psychiatric/Behavioral: The patient is not nervous/anxious.     Physical Exam Constitutional: He is oriented to person, place, and time. No distress. is obese  Eyes: Conjunctivae are normal.  Neck: Neck supple. No JVD present. No thyromegaly present.  Cardiovascular: Normal rate, regular rhythm and intact distal pulses.   Respiratory: Effort normal and breath sounds normal. No respiratory distress. He has no wheezes.  Has capped trach   GI: Soft. Bowel sounds are normal. He exhibits no distension. There is no tenderness.  Peg tube present   Musculoskeletal: He exhibits no edema.  Able to move all extremities   Lymphadenopathy:    He has no cervical adenopathy.  Neurological: He is alert and oriented to person, place, and time.  Skin: Skin is warm and  dry. He is not diaphoretic.  Psychiatric: He has a normal mood and affect.      ASSESSMENT/ PLAN:  Will discharge to home with home health for pt/otrt/rn to evaluate and treat as indicated for gait, balance, adl retraining, trach care inr management. He will need a bariatric rollator in order to maintain his [place] level of independence with his adl's. He will need trach care supplies and will need a neb machine. His prescriptions have been written for a 30 day supply of his medications with  #30 klonopin 0.5 mg tabs. He will follow up with Dr. Lazarus Salines on 05-23-15 at 1 pm    Time spent with patient 45   minutes >50% time spent counseling; reviewing medical record; tests; labs; and developing future plan of care   Synthia Innocent NP [facility]  Contact [phone] Monday through Friday 8am- 5pm  After hours call [phone]

## 2015-07-26 ENCOUNTER — Emergency Department (HOSPITAL_COMMUNITY)
Admission: EM | Admit: 2015-07-26 | Discharge: 2015-07-26 | Disposition: A | Discharge status: Home / Self Care | Payer: No Typology Code available for payment source | Attending: Emergency Medicine | Admitting: Emergency Medicine

## 2015-07-26 ENCOUNTER — Encounter (HOSPITAL_COMMUNITY): Payer: Self-pay

## 2015-07-26 ENCOUNTER — Emergency Department (HOSPITAL_COMMUNITY): Payer: No Typology Code available for payment source

## 2015-07-26 DIAGNOSIS — E785 Hyperlipidemia, unspecified: Secondary | ICD-10-CM | POA: Insufficient documentation

## 2015-07-26 DIAGNOSIS — Z87828 Personal history of other (healed) physical injury and trauma: Secondary | ICD-10-CM | POA: Insufficient documentation

## 2015-07-26 DIAGNOSIS — Z430 Encounter for attention to tracheostomy: Secondary | ICD-10-CM | POA: Insufficient documentation

## 2015-07-26 DIAGNOSIS — Z8739 Personal history of other diseases of the musculoskeletal system and connective tissue: Secondary | ICD-10-CM | POA: Insufficient documentation

## 2015-07-26 DIAGNOSIS — J449 Chronic obstructive pulmonary disease, unspecified: Secondary | ICD-10-CM | POA: Insufficient documentation

## 2015-07-26 DIAGNOSIS — Z87891 Personal history of nicotine dependence: Secondary | ICD-10-CM | POA: Insufficient documentation

## 2015-07-26 DIAGNOSIS — I509 Heart failure, unspecified: Secondary | ICD-10-CM | POA: Insufficient documentation

## 2015-07-26 DIAGNOSIS — I10 Essential (primary) hypertension: Secondary | ICD-10-CM | POA: Insufficient documentation

## 2015-07-26 DIAGNOSIS — Z79899 Other long term (current) drug therapy: Secondary | ICD-10-CM | POA: Insufficient documentation

## 2015-07-26 NOTE — ED Notes (Signed)
Contacted xray regarding pt. Chest xray. They will send for transport.

## 2015-07-26 NOTE — ED Provider Notes (Signed)
CSN: [id number]     Arrival date & time 07/26/15  1125 History   First MD Initiated Contact with Patient 07/26/15 1130     Chief Complaint  Patient presents with  . Tracheostomy Tube Change     (Consider location/radiation/quality/duration/timing/severity/associated sxs/prior Treatment) HPI Comments: Tracheostomy came out Patient is not sure how it came out this time Last came out 2 weeks ago No SOB/fevers/cough/bleeding [name] has been present since May   Past Medical History  Diagnosis Date  . Arthritis   . RCT (rotator cuff tear)   . Multiple abrasions     rt arm  . Morbid obesity (HCC)   . Hypertension   . Hyperlipidemia   . COPD (chronic obstructive pulmonary disease) (HCC)   . CHF (congestive heart failure) [facility](HCC)    Past Surgical History  Procedure Laterality Date  . No past surgeries    . Shoulder arthroscopy with subacromial decompression, rotator cuff repair and bicep tendon repair Right 12/28/2014    Procedure: RIGHT SHOULDER ARTHROSCOPY WITH SUBACROMIAL DECOMPRESSION, DISTAL CLAVICLE RESECTION, ROTATOR CUFF REPAIR ;  Surgeon: Eugenia Mcalpineobert Collins, MD;  Location: [facility];  Service: Orthopedics;  Laterality: Right;  . Tracheostomy tube placement N/A 01/16/2015    Procedure: TRACHEOSTOMY;  Surgeon: Flo ShanksKarol Wolicki, MD;  [facility];  Service: ENT;  Laterality: N/A;  . Tracheostomy revision N/A 01/18/2015    Procedure: TRACHEOSTOMY REVISION;  Surgeon: Melvenia BeamMitchell [name], MD;  [facility];  Service: ENT;  Laterality: N/A;  . Tracheostomy tube placement N/A 01/22/2015    Procedure: TRACHEOSTOMY;  Surgeon: Flo ShanksKarol Wolicki, MD;  [facility];  Service: ENT;  Laterality: N/A;  . [id number]     Family History  Problem Relation Age of Onset  . Heart disease Mother   . Heart attack Mother   . Heart disease Brother    Social History  Substance Use Topics  . Smoking status: Former Smoker -- 0.20 packs/day    Types: Cigarettes  . Smokeless tobacco: None  . Alcohol Use: No     Review of Systems  Constitutional: Negative for fever.  HENT: Negative for sore throat.   Eyes: Negative for visual disturbance.  Respiratory: Negative for shortness of breath.   Cardiovascular: Negative for chest pain.  Gastrointestinal: Negative for abdominal pain.  Genitourinary: Negative for difficulty urinating.  Musculoskeletal: Negative for back pain and neck stiffness.  Skin: Negative for rash.  Neurological: Negative for syncope and headaches.      Allergies  Review of patient's allergies indicates no known allergies.  Home Medications   Prior to Admission medications   Medication Sig Start Date End Date Taking? Authorizing Provider  ciprofloxacin (CIPRO) 500 MG tablet Take 1 tablet (500 mg total) by mouth 2 (two) times daily. Continue taking for total 5 weeks 06/26/15  Yes Dorothea OgleIskra M Myers, MD  clonazePAM (KLONOPIN) 0.5 MG tablet Take 1 tablet (0.5 mg total) by mouth 3 (three) times daily. Patient taking differently: Take 0.5 mg by mouth 2 (two) times daily.  06/23/15  Yes Dorothea OgleIskra M Myers, MD  docusate sodium (COLACE) 100 MG capsule Take 1 capsule (100 mg total) by mouth 2 (two) times daily. 06/26/15  Yes Dorothea OgleIskra M Myers, MD  famotidine (PEPCID) 20 MG tablet Take 20 mg by mouth daily.   Yes Historical Provider, MD  folic acid (FOLVITE) 1 MG tablet Take 1 mg by mouth daily.   Yes Historical Provider, MD  furosemide (LASIX) 40 MG tablet Take 1 tablet (40 mg total) by mouth daily.  06/13/15  Yes Ruben Im, MD  hydrALAZINE (APRESOLINE) 25 MG tablet Take 37.5 mg by mouth 2 (two) times daily.   Yes Historical Provider, MD  ipratropium-albuterol (DUONEB) 0.5-2.5 (3) MG/3ML SOLN Take 3 mLs by nebulization every 6 (six) hours as needed. For shortness of breath   Yes Historical Provider, MD  isosorbide mononitrate (ISMO,MONOKET) 20 MG tablet Take 20 mg by mouth 2 (two) times daily at 10 AM and 5 PM.   Yes Historical Provider, MD  lidocaine (LIDODERM) 5 % Place 1 patch onto the skin  daily. Remove & Discard patch within 12 hours or as directed by MD To right shoulder   Yes Historical Provider, MD  metoCLOPramide (REGLAN) 10 MG tablet Take 10 mg by mouth 4 (four) times daily -  before meals and at bedtime.   Yes Historical Provider, MD  metolazone (ZAROXOLYN) 5 MG tablet Take 5 mg by mouth daily.   Yes Historical Provider, MD  Omega-3 Fatty Acids (FISH OIL) 1000 MG CAPS Take 1,000 mg by mouth daily.    Yes Historical Provider, MD  ondansetron (ZOFRAN) 4 MG tablet Take 4 mg by mouth every 6 (six) hours.   Yes Historical Provider, MD  polyethylene glycol (MIRALAX / GLYCOLAX) packet Take 17 g by mouth daily. 06/26/15  Yes Dorothea Ogle, MD  potassium chloride SA (K-DUR,KLOR-CON) 20 MEQ tablet Take 2 tablets (40 mEq total) by mouth 4 (four) times daily. 04/22/15  Yes Sharee Holster, NP  saccharomyces boulardii (FLORASTOR) 250 MG capsule Take 250 mg by mouth 2 (two) times daily.   Yes Historical Provider, MD  sertraline (ZOLOFT) 100 MG tablet Take 100 mg by mouth daily.   Yes Historical Provider, MD  traMADol (ULTRAM) 50 MG tablet Take 50 mg by mouth every 6 (six) hours as needed for moderate pain.   Yes Historical Provider, MD   BP 113/62 mmHg  Pulse 95  Temp(Src) 98.4 F (36.9 C) (Oral)  Resp 20  SpO2 98% Physical Exam  Constitutional: He is oriented to person, place, and time. He appears well-developed and well-nourished. No distress.  HENT:  Head: Normocephalic and atraumatic.  Eyes: Conjunctivae and EOM are normal.  Neck: Normal range of motion. No tracheal deviation present.  Tracheostomy site clean, no drainage, approx 1cm diameter  Cardiovascular: Normal rate, regular rhythm, normal heart sounds and intact distal pulses.  Exam reveals no gallop and no friction rub.   No murmur heard. Pulmonary/Chest: Effort normal and breath sounds normal. No respiratory distress. He has no wheezes. He has no rales.  Abdominal: Soft. He exhibits no distension. There is no  tenderness. There is no guarding.  Musculoskeletal: He exhibits no edema.  Neurological: He is alert and oriented to person, place, and time.  Skin: Skin is warm and dry. He is not diaphoretic.  Nursing note and vitals reviewed.   ED Course  TRACHEOSTOMY REPLACEMENT Date/Time: 07/26/2015 3:09 PM Performed by: Alvira Monday Authorized by: Alvira Monday Consent: Verbal consent obtained. Risks and benefits: risks, benefits and alternatives were discussed Consent given by: patient Required items: required blood products, implants, devices, and special equipment available Patient identity confirmed: verbally with patient Time out: Immediately prior to procedure a "time out" was called to verify the correct patient, procedure, equipment, support staff and site/side marked as required. Indications: became dislodged Local anesthesia used: no Patient sedated: no Tube cuff: cuffless Tube size: 6.5 mm Patient tolerance: Patient tolerated the procedure well with no immediate complications   (including critical care time)  Labs Review Labs Reviewed - No data to display  Imaging Review Dg Chest 2 View  07/26/2015  CLINICAL DATA:  Tracheostomy replacement.  Cough today. EXAM: CHEST  2 VIEW COMPARISON:  06/21/2015 FINDINGS: Tracheostomy tube is well positioned with its tip projecting over the upper thoracic tracheal air shadow. Cardiac silhouette is top-normal in size. No mediastinal or hilar masses or evidence of adenopathy. There are prominent bronchovascular markings, which is stable. No lung consolidation to suggest pneumonia. No pulmonary edema. No pleural effusion or pneumothorax. Bony thorax is demineralized but intact. IMPRESSION: 1. No acute cardiopulmonary disease. 2. Tracheostomy tube is well positioned. Electronically Signed   By: Amie Portland M.D.   On: 07/26/2015 13:42   I have personally reviewed and evaluated these images and lab results as part of my medical  decision-making.   EKG Interpretation None      MDM   Final diagnoses:  Encounter for tracheostomy tube change [facility])   64 year old male with a history of obesity hypoventilation syndrome, chronic DVT, chronic respiratory failure with hypoxia, tracheostomy, chronic diastolic heart failure, hypertension, prostate abscess presents from [facility] for concern of dislodgment of his trach.  Patient reports that it came out this morning and denies any respiratory distress, or other symptoms. Tracheostomy tube was replaced at bedside by respiratory therapist and myself.  CXR confirmed placement.  Patient with sputum tinged with blood, however no significant bleeding or complications following replacement.  Patient discharged in stable condition with understanding of reasons to return and will follow up with Dr. Pollyann Kennedy.     Alvira Monday, MD 07/26/15 [phone]

## 2015-07-26 NOTE — ED Notes (Signed)
Pt. Presents following trach coming out this AM. Pt. States this has happened once before. Denies any complaints or illness. Pt. Is from [facility]. Vitals WNL. No respiratory distress.

## 2015-07-26 NOTE — Discharge Instructions (Signed)
Tracheostomy Tube Safety °A tracheostomy tube (commonly known as trach tube) allows a person to breath without using his or her nose or mouth. A trach tube may be needed if: °· A person's airway is blocked by swelling, injury, tumor, foreign body, vocal cord problem, or severe narrowing of the trachea. °· A person needs long-term ventilation. °· A person has excess airway secretions requiring frequent suctioning. °If you have a trach, you must follow certain safety measures to keep yourself safe and free of infection. °SAFETY MEASURES °· Always carry your emergency travel kit with you when you leave the house. Your bag should include: °¨ A portable suction machine. °¨ Suction catheters. °¨ A mucus trap. °¨ A bulb syringe. °¨ Two trach tubes (one the same size and one smaller). °¨ Trach ties. °¨ Heat and moisture exchanger. °¨ Emergency phone numbers. °¨ Sterile water. °¨ 0.9% saline solution. °¨ Sterile gloves or hand sanitizer. °¨ Sterile gauze pads. °· Clean your stoma site as directed to prevent infection. °· Secure the trach tube exactly as directed to keep the tube from moving out of place. °· Suction the trach tube as often as directed and exactly as directed. °· Avoid dust, mold, tobacco smoke, other types of smoke, and fumes from cleaning solutions, such as ammonia or bleach. °· Cover your trach tube when using any kind of spray product or powder. It is important that you do not inhale the mist or powder. °· Do not sterilize plastic tubes or attempt to clean them in boiling water. They are to be used only once. °· Do not store replacement plastic trach tubes in a location in which the temperature is over 118° F (48° C). °· If you have a cuffed trach tube, do not over inflate the cuff. This can injure your trachea. It may also cause the cuff to extend past the end of the tube where it can restrict or block air flow. °· If you cannot remove your trach tube or the smaller tube that fits inside the trach tube  (inner cannula), do not force it. Call your caregiver. °· Use a humidifier at home to keep some moisture in the air and to prevent your airway and lungs from drying out. Clean the humidifier regularly to prevent buildup of mold and mildew. °· Do not put anything in your trach tube that should not be there. °· Keep your stoma and trach tube dry when you bathe or shower. Direct the shower spray at chest level and place a shower shield or protective covering over your trach tube. °· Keep clothing away from the trach tube except for a protective scarf. Clothing may block the trach tube. Avoid crew necks and turtlenecks. Wear v-neck shirts and open collar shirts or blouses. Do not wear clothes that shed fibers or lint. °· If going outside in very cold air, wear a filter to prevent cold air from entering the trach tube. You can also loosely cover the trach tube with a protective scarf, handkerchief, or gauze. This helps to warm the air as you breathe, so that the cold air does not irritate your trachea and lungs. It also helps to keep out dust or dirt on windy days. °· Sickness: °¨ Suction more frequently is you become sick. °¨ Drink enough fluids to keep your urine clear or pale yellow if you have a fever, vomiting, or diarrhea. °¨ If you vomit, cover your trach tube with a towel to keep vomit out of your airway. If you   think vomit may have entered the trach tube, suction right away.  Post CPR instructions and emergency numbers where they can be seen in an emergency. All of your caregivers must know CPR.  If you use a ventilator:  Routinely check the ventilator safety and sound alarms to be sure they work properly.  Be sure the ventilator tubes are properly placed so that they do not pull on the trach tube.  Do not twist or pull on the trach connector more than needed. This may cause discomfort or disconnect the ventilator tubes.  Hold the trach tube in place when hooking up or disconnecting the ventilator or  humidification tubing.  Always use an inner cannula without side openings (non-fenestrated) with the correct connector if the trach tube has one or more side openings (fenestrated trach tube).  Obtain ongoing support as needed to adjust to living with trach tube. SEEK IMMEDIATE MEDICAL CARE IF:   You have difficulty breathing even after suctioning and cleaning.  You have swelling, redness, warmth, drainage, or tenderness around the stoma.  You have a fever or persistent symptoms for more than 2 to 3 days.  You have a fever and your symptoms suddenly get worse.  You have chills or muscle aches.  You have nausea and vomiting.  You feel dizzy or feel faint.  You have difficulty swallowing.  You have unusual sounds coming from the airway or continue to cough after suctioning.  You have chest pain or have difficulty breathing (despite a clean and properly placed tube).  You have bleeding from the stoma.  You have bright red blood in your mucus.  Your tube becomes plugged and you cannot clear it.  Your tube falls out and cannot be reinserted.   This information is not intended to replace advice given to you by your health care provider. Make sure you discuss any questions you have with your health care provider.   Document Released: 05/18/2012 Document Reviewed: 05/18/2012 Elsevier Interactive Patient Education [employer].

## 2015-07-26 NOTE — Progress Notes (Signed)
Charge RT replaced #6 cuffless Trach that pt brought with from [facility]. Positive color change on etco2, CXR pending, 56F suction catheter passed with no complications. Small amount of blood present. RT will continue to monitor.

## 2015-07-29 ENCOUNTER — Encounter: Payer: Self-pay | Admitting: Internal Medicine

## 2015-08-05 ENCOUNTER — Non-Acute Institutional Stay (SKILLED_NURSING_FACILITY): Payer: No Typology Code available for payment source | Admitting: Adult Health

## 2015-08-05 DIAGNOSIS — H6012 Cellulitis of left external ear: Secondary | ICD-10-CM | POA: Diagnosis not present

## 2015-08-05 DIAGNOSIS — I5032 Chronic diastolic (congestive) heart failure: Secondary | ICD-10-CM

## 2015-08-05 DIAGNOSIS — F418 Other specified anxiety disorders: Secondary | ICD-10-CM | POA: Diagnosis not present

## 2015-08-05 DIAGNOSIS — E662 Morbid (severe) obesity with alveolar hypoventilation: Secondary | ICD-10-CM | POA: Diagnosis not present

## 2015-08-05 DIAGNOSIS — E876 Hypokalemia: Secondary | ICD-10-CM | POA: Diagnosis not present

## 2015-08-05 DIAGNOSIS — I10 Essential (primary) hypertension: Secondary | ICD-10-CM | POA: Diagnosis not present

## 2015-08-05 DIAGNOSIS — I82501 Chronic embolism and thrombosis of unspecified deep veins of right lower extremity: Secondary | ICD-10-CM | POA: Diagnosis not present

## 2015-08-05 DIAGNOSIS — J9611 Chronic respiratory failure with hypoxia: Secondary | ICD-10-CM | POA: Diagnosis not present

## 2015-08-12 NOTE — Addendum Note (Signed)
Addended by: Remus BlakeBARROW, Carlyn Lemke K on: 08/12/2015 02:40 PM   Modules accepted: Orders

## 2015-09-02 ENCOUNTER — Encounter: Payer: Self-pay | Admitting: Adult Health

## 2015-09-02 DIAGNOSIS — F418 Other specified anxiety disorders: Secondary | ICD-10-CM | POA: Insufficient documentation

## 2015-09-02 NOTE — Progress Notes (Signed)
Patient ID: [name], male   DOB: [birth year], 64 y.o.   MRN: [id number]    Facility: [facility]      No Known Allergies  Chief Complaint  Patient presents with  . Hospitalization Follow-up    HPI:  He has been hospitalized for a prostate abscess. He is on abt through 07-31-15. He is here for short term rehab with his goal to return back home. He will need inr monitoring three times weekly while he is on abt. His last kub demonstrates an ileus he will need monitoring for constipation. He is not voicing any concerns at this time. There are no nursing concerns at this time.     Past Medical History  Diagnosis Date  . Arthritis   . RCT (rotator cuff tear)   . Multiple abrasions     rt arm  . Morbid obesity ([place])   . Hypertension   . Hyperlipidemia   . COPD (chronic obstructive pulmonary disease) ([place])   . CHF (congestive heart failure) [facility])     Past Surgical History  Procedure Laterality Date  . No past surgeries    . Shoulder arthroscopy with subacromial decompression, rotator cuff repair and bicep tendon repair Right 12/28/2014    Procedure: RIGHT SHOULDER ARTHROSCOPY WITH SUBACROMIAL DECOMPRESSION, DISTAL CLAVICLE RESECTION, ROTATOR CUFF REPAIR ;  Surgeon: Sydnee Cabal, MD;  Location: [facility];  Service: Orthopedics;  Laterality: Right;  . Tracheostomy tube placement N/A 01/16/2015    Procedure: TRACHEOSTOMY;  Surgeon: Jodi Marble, MD;  Location: [place];  Service: ENT;  Laterality: N/A;  . Tracheostomy revision N/A 01/18/2015    Procedure: TRACHEOSTOMY REVISION;  Surgeon: Ruby Cola, MD;  Location: [place];  Service: ENT;  Laterality: N/A;  . Tracheostomy tube placement N/A 01/22/2015    Procedure: TRACHEOSTOMY;  Surgeon: Jodi Marble, MD;  Location: [place];  Service: ENT;  Laterality: N/A;  . [id number]      VITAL SIGNS BP 123/86 mmHg  Pulse 80  Ht 6' (1.829 m)  Wt 243 lb (110.224 kg)  BMI 32.95 kg/m2  SpO2 92%  Patient's Medications  New Prescriptions   No  medications on file  Previous Medications   cipro 500 mg  Take 500 mg twice daily through 07-31-15   colace 100 mg twice daily    Coumadin  1.25 mg daily   duoneb Every 6 hours as needed   Fish oil 1 gm daily    florastor  Take twice daily    Folic acid Take 1 mg daily   hydralazine 37.5 mg twice daily    isordil 20 mg twice daily   klonopin 0.5 mg three times daily    kdur 40 meq four times daily    Lasix 40 mg  Take 40 mg daily    lidoderm 1 patch to right shoulder daily   Metolazone 5 mg  Take 5 mg daily   miralax Take 17 gm daily   pepcid Take 20 mg daily   reglan 10 mg four times daily    zofran  4 mg four times daily   zoloft 100 mg  Take 100 mg daily   Modified Medications   No medications on file  Discontinued Medications   No medications on file     SIGNIFICANT DIAGNOSTIC EXAMS   12-29-14: testicle ultrasound: Two solid extratesticular right scrotal masses. Primary differential diagnosis for extratesticular masses include primary or metastatic neoplasms including adenomatoid tumor, fibroma, leiomyoma, mesothelioma, or sarcoma. Urology consultation is recommended. No sonographic  evidence for testicular torsion or intratesticular mass.  01-01-15: 2-d echo: Left ventricle: The cavity size was normal. There was mild focal basal and mild concentric hypertrophy of the septum. Systolic function was normal. The estimated ejection fraction was in the range of 60% to 65%. Wall motion was normal; there were no regional wall motion abnormalities. Doppler parameters are consistent with abnormal left ventricular relaxation (grade 1 diastolic dysfunction). There was no evidence of elevate ventricular filling pressure by Doppler parameters. - Aortic valve: There was no regurgitation. - Aortic root: The aortic root was normal in size. - Mitral valve: Structurally normal valve. There was no regurgitation. - Right ventricle: Systolic function was normal. - Right atrium: The atrium was  normal in size. - Tricuspid valve: There was trivial regurgitation. - Pulmonary arteries: Systolic pressure was within the normal range.   01-07-15: renal ultrasound: Limited examination demonstrating no acute abnormality. Negative for Hydronephrosis. Possible nonobstructing stone lower pole left kidney.  01-18-15: IVC filter placement: Successful IVC filter placement. This is temporary or can remain in place to become permanent.  01-28-15: ct of abdomen: 1. No significant colonic interposition between the anterior abdominal wall and stomach. 2. Hepatic steatosis. 3. Bilateral nephrolithiasis. Possible left UPJ stenosis without secondary signs of ureteral obstruction. 4. Cardiomegaly, bibasilar atelectasis and a small amount of retrosternal air, likely related to previous thoracic intervention.   01-28-15: ct of head: 1. No acute intracranial findings demonstrated. Mild atrophy and periventricular white matter disease. 2. Bilateral mastoid effusions with opacification of the left middle ear.  06-17-15: chest x-ray; Cardiomegaly with mild pulmonary vascular congestion  06-17-15: ct of abdomen and pelvis: 1. Peripherally enhancing fluid collection or collections centered about the right-side of the prostate and pelvic cul-de-sac. Suspicious for abscess/ abscesses. 2. Air within the urinary bladder and left renal collecting system/ureter. Suspicious for ascending infection. Edema in the left hemipelvis, along the course of the left ureter, is likely secondary. 3. Fluid-filled large and small bowel loops are suspicious for adynamic ileus. No focal transition point identified. 4. Bilateral nephrolithiasis. 5.  Atherosclerosis, including within the coronary arteries. 6. Mild motion degradation.  06-19-15: ct guided prostate abscess drain: Fluid collection along the right side of the prostate compatible with an abscess. 30 mL of purulent fluid was removed.  06-24-15: ct of abdomen and pelvis:  Fatty infiltration of the liver. Bilateral nonobstructive nephrolithiasis. Atherosclerosis of abdominal aorta without aneurysm formation. Sigmoid diverticulosis is noted without inflammation. Interval placement of transgluteal drainage catheter into right side of the pelvis. Fluid collection noted adjacent to prostate gland on prior exam has been completely decompressed.  06-25-15: kub: Moderate ileus pattern.    LABS REVIEWED:   12-31-14: hgb a1c 7.5; 01-23-15: hgb a1c 7.2  01-31-15: vit b12: 304 02-13-15: hgb a1c 6.3 03-14-15: wbc 8.5; hgb 12.;1 hct 36.3; mcv 93.1; plt 251; glucose 112; bun 20; creat 0.82; k+3.0; na++135; phos 3.7; mag 1.2; albumin 3.2 03-25-15: wbc 8.5; hgb 11.7; hct 36.4; mcv 95.0; plt 240; glucose 111; bun 20; creat 1.01; k+3.4; na++138  04-01-15: glucose 112; bun 19; creat 0.97; k+3.3; na++139 04-03-15: inr 1.63 coumadin 6 mg 04-09-15: inr 2.16 coumadin 7 mg 04-15-15: inr 2.16 coumadin 7 mg 04-12-15: glucose 101; bun 20; creat 0.99; k+3.3; na++ 140 06-17-15: wbc 12.6; hgb 13.6; hct 41.0; mcv 86.1; plt 282; glucose 137; bun 24; creat 0.90; k+ 3.3; na++137; alk phos  173; albumin 3.1; blood culture: no growth 06-18-15: hgb a1c 6.4 06-20-15: wbv 9.6; hgb 11.6; hct 36.7; mvc  87.4 plt 335 06-22-15: wbc 11.7; hgb 13.1; hct 40.3; mcv 85.4; plt 420; glucose 114; bun 19; creat 0.95; k+ 3.4; na++133 06-25-15: wbc 11.4; hgb 13.1; hct 41.1; mcv 84.7; plt 461; glucose 128; bun 36; creat 1.23; k+ 3.3; na++135; mag 2.1  06-28-15: wbc 11.;5 hgb 13.5; hct 39.6; mcv 82.2; plt 462; glucose 106; bun 40; creat 1.44; k+ 3.6; na++134 07-01-15: INR 2.19     Review of Systems Constitutional: Negative for appetite change and fatigue.  HENT: Negative for congestion.   Respiratory: Negative for cough, chest tightness and shortness of breath.   Cardiovascular: Negative for chest pain, palpitations and leg swelling.  Gastrointestinal: Negative for nausea, abdominal pain, diarrhea and constipation.    Musculoskeletal: Negative for myalgias and arthralgias.  Skin: Negative for pallor.  Neurological: Negative for dizziness.  Psychiatric/Behavioral: The patient is not nervous/anxious.     Physical Exam Constitutional: He is oriented to person, place, and time. No distress. is obese  Eyes: Conjunctivae are normal.  Neck: Neck supple. No JVD present. No thyromegaly present.  Cardiovascular: Normal rate, regular rhythm and intact distal pulses.   Respiratory: Effort normal and breath sounds normal. No respiratory distress. He has no wheezes.  Has capped trach   GI: Soft. Bowel sounds are normal. He exhibits no distension. There is no tenderness.    Musculoskeletal: He exhibits no edema.  Able to move all extremities   Lymphadenopathy:    He has no cervical adenopathy.  Neurological: He is alert and oriented to person, place, and time.  Skin: Skin is warm and dry. He is not diaphoretic.  Psychiatric: He has a normal mood and affect.     ASSESSMENT/ PLAN:  1. Chronic pain: his pain is presently being managed with lidoderm patch to right shoulder; will not make changes will monitor his status   2. COPD; chronic respiratory failure; obesity hypoventilation syndrome:   is presenlty stable is trach dependent is presently not on medications;   3. Diastolic heart failure: will continue lasix 40 mg daily zaroxolyn 5 mg daily   4. Hypokalemia: will continue  k+  40 meq 4 times daily    5. Hypertension: will continue hydralazine 37.5 mg twice daily; isordil 20 mg twice daily   6. Jerrye Bushy: will continue reglan 10 mg four times daily and pepcid 20 mg daily take zofran 4 mg four times daily for nausea   7. Constipation: will continue miralax daily colace twice daily   8. Depression with anxiety: will continue zoloft 100 mg daily; take klonopin 0.5 mg three times daily for anxiety  9.  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 Prostate abscess: is on cripo 500 mg twice daily through 07-31-15 will monitor  Will check bmp this week   Time spent with patient 50   minutes >50% time spent counseling; reviewing medical record; tests; labs; and developing future plan of care      Ok Edwards NP [facility]  Contact [phone] Monday through Friday 8am- 5pm  After hours call [phone]

## 2015-09-05 ENCOUNTER — Encounter: Payer: Self-pay | Admitting: Adult Health

## 2015-09-05 DIAGNOSIS — H6012 Cellulitis of left external ear: Secondary | ICD-10-CM | POA: Insufficient documentation

## 2015-09-05 NOTE — Progress Notes (Signed)
Patient ID: [name], male   DOB: [birth year], 64 y.o.   MRN: [id number]    Facility: [facility]      No Known Allergies  Chief Complaint  Patient presents with  . Medical Management of Chronic Issues    HPI:  He is a resident of this facility being seen for the management of his chronic illnesses. Overall there is little change in his status. His left ear is inflamed hot; swollen and very tender to touch. There are no reports of fever present.    Past Medical History  Diagnosis Date  . Arthritis   . RCT (rotator cuff tear)   . Multiple abrasions     rt arm  . Morbid obesity ([place])   . Hypertension   . Hyperlipidemia   . COPD (chronic obstructive pulmonary disease) ([place])   . CHF (congestive heart failure) [facility])     Past Surgical History  Procedure Laterality Date  . No past surgeries    . Shoulder arthroscopy with subacromial decompression, rotator cuff repair and bicep tendon repair Right 12/28/2014    Procedure: RIGHT SHOULDER ARTHROSCOPY WITH SUBACROMIAL DECOMPRESSION, DISTAL CLAVICLE RESECTION, ROTATOR CUFF REPAIR ;  Surgeon: Sydnee Cabal, MD;  Location: [facility];  Service: Orthopedics;  Laterality: Right;  . Tracheostomy tube placement N/A 01/16/2015    Procedure: TRACHEOSTOMY;  Surgeon: Jodi Marble, MD;  Location: [place];  Service: ENT;  Laterality: N/A;  . Tracheostomy revision N/A 01/18/2015    Procedure: TRACHEOSTOMY REVISION;  Surgeon: Ruby Cola, MD;  Location: [place];  Service: ENT;  Laterality: N/A;  . Tracheostomy tube placement N/A 01/22/2015    Procedure: TRACHEOSTOMY;  Surgeon: Jodi Marble, MD;  Location: [place];  Service: ENT;  Laterality: N/A;  . [id number]      VITAL SIGNS BP 132/80 mmHg  Pulse 79  Ht 6' (1.829 m)  Wt 243 lb (110.224 kg)  BMI 32.95 kg/m2  Patient's Medications  New Prescriptions   No medications on file  Previous Medications   CLONAZEPAM (KLONOPIN) 0.5 MG TABLET    Take 1 tablet (0.5 mg total) by mouth 2 (two) times daily.     DOCUSATE SODIUM (COLACE) 100 MG CAPSULE    Take 1 capsule (100 mg total) by mouth 2 (two) times daily.   FAMOTIDINE (PEPCID) 20 MG TABLET    Take 20 mg by mouth daily.   FOLIC ACID (FOLVITE) 1 MG TABLET    Take 1 mg by mouth daily.   FUROSEMIDE (LASIX) 40 MG TABLET    Take 1 tablet (40 mg total) by mouth daily.   HYDRALAZINE (APRESOLINE) 25 MG TABLET    Take 37.5 mg by mouth 2 (two) times daily.   IPRATROPIUM-ALBUTEROL (DUONEB) 0.5-2.5 (3) MG/3ML SOLN    Take 3 mLs by nebulization every 6 (six) hours as needed. For shortness of breath   ISOSORBIDE MONONITRATE (ISMO,MONOKET) 20 MG TABLET    Take 20 mg by mouth 2 (two) times daily at 10 AM and 5 PM.   LIDOCAINE (LIDODERM) 5 %    Place 1 patch onto the skin daily. Remove & Discard patch within 12 hours or as directed by MD To right shoulder   METOCLOPRAMIDE (REGLAN) 10 MG TABLET    Take 10 mg by mouth 4 (four) times daily -  before meals and at bedtime.   METOLAZONE (ZAROXOLYN) 5 MG TABLET    Take 5 mg by mouth daily.   OMEGA-3 FATTY ACIDS (FISH OIL) 1000 MG CAPS  Take 1,000 mg by mouth daily.    ONDANSETRON (ZOFRAN) 4 MG TABLET    Take 4 mg by mouth every 6 (six) hours.   POLYETHYLENE GLYCOL (MIRALAX / GLYCOLAX) PACKET    Take 17 g by mouth daily.   POTASSIUM CHLORIDE SA (K-DUR,KLOR-CON) 20 MEQ TABLET    Take 2 tablets (40 mEq total) by mouth 4 (four) times daily.   SACCHAROMYCES BOULARDII (FLORASTOR) 250 MG CAPSULE    Take 250 mg by mouth 2 (two) times daily.   SERTRALINE (ZOLOFT) 100 MG TABLET    Take 100 mg by mouth daily.   Coumadin 6 mg Take 6 mg every night    TRAMADOL (ULTRAM) 50 MG TABLET    Take 50 mg by mouth every 6 (six) hours as needed for moderate pain.  Modified Medications   No medications on file  Discontinued Medications   No medications on file     SIGNIFICANT DIAGNOSTIC EXAMS   12-29-14: testicle ultrasound: Two solid extratesticular right scrotal masses. Primary differential diagnosis for extratesticular masses  include primary or metastatic neoplasms including adenomatoid tumor, fibroma, leiomyoma, mesothelioma, or sarcoma. Urology consultation is recommended. No sonographic evidence for testicular torsion or intratesticular mass.  01-01-15: 2-d echo: Left ventricle: The cavity size was normal. There was mild focal basal and mild concentric hypertrophy of the septum. Systolic function was normal. The estimated ejection fraction was in the range of 60% to 65%. Wall motion was normal; there were no regional wall motion abnormalities. Doppler parameters are consistent with abnormal left ventricular relaxation (grade 1 diastolic dysfunction). There was no evidence of elevate ventricular filling pressure by Doppler parameters. - Aortic valve: There was no regurgitation. - Aortic root: The aortic root was normal in size. - Mitral valve: Structurally normal valve. There was no regurgitation. - Right ventricle: Systolic function was normal. - Right atrium: The atrium was normal in size. - Tricuspid valve: There was trivial regurgitation. - Pulmonary arteries: Systolic pressure was within the normal range.   01-07-15: renal ultrasound: Limited examination demonstrating no acute abnormality. Negative for Hydronephrosis. Possible nonobstructing stone lower pole left kidney.  01-18-15: IVC filter placement: Successful IVC filter placement. This is temporary or can remain in place to become permanent.  01-28-15: ct of abdomen: 1. No significant colonic interposition between the anterior abdominal wall and stomach. 2. Hepatic steatosis. 3. Bilateral nephrolithiasis. Possible left UPJ stenosis without secondary signs of ureteral obstruction. 4. Cardiomegaly, bibasilar atelectasis and a small amount of retrosternal air, likely related to previous thoracic intervention.   01-28-15: ct of head: 1. No acute intracranial findings demonstrated. Mild atrophy and periventricular white matter disease. 2. Bilateral mastoid  effusions with opacification of the left middle ear.  06-17-15: chest x-ray; Cardiomegaly with mild pulmonary vascular congestion  06-17-15: ct of abdomen and pelvis: 1. Peripherally enhancing fluid collection or collections centered about the right-side of the prostate and pelvic cul-de-sac. Suspicious for abscess/ abscesses. 2. Air within the urinary bladder and left renal collecting system/ureter. Suspicious for ascending infection. Edema in the left hemipelvis, along the course of the left ureter, is likely secondary. 3. Fluid-filled large and small bowel loops are suspicious for adynamic ileus. No focal transition point identified. 4. Bilateral nephrolithiasis. 5.  Atherosclerosis, including within the coronary arteries. 6. Mild motion degradation.  06-19-15: ct guided prostate abscess drain: Fluid collection along the right side of the prostate compatible with an abscess. 30 mL of purulent fluid was removed.  06-24-15: ct of abdomen and pelvis: Fatty infiltration  of the liver. Bilateral nonobstructive nephrolithiasis. Atherosclerosis of abdominal aorta without aneurysm formation. Sigmoid diverticulosis is noted without inflammation. Interval placement of transgluteal drainage catheter into right side of the pelvis. Fluid collection noted adjacent to prostate gland on prior exam has been completely decompressed.  06-25-15: kub: Moderate ileus pattern.    LABS REVIEWED:   12-31-14: hgb a1c 7.5; 01-23-15: hgb a1c 7.2  01-31-15: vit b12: 304 02-13-15: hgb a1c 6.3 03-14-15: wbc 8.5; hgb 12.;1 hct 36.3; mcv 93.1; plt 251; glucose 112; bun 20; creat 0.82; k+3.0; na++135; phos 3.7; mag 1.2; albumin 3.2 03-25-15: wbc 8.5; hgb 11.7; hct 36.4; mcv 95.0; plt 240; glucose 111; bun 20; creat 1.01; k+3.4; na++138  04-01-15: glucose 112; bun 19; creat 0.97; k+3.3; na++139 04-03-15: inr 1.63 coumadin 6 mg 04-09-15: inr 2.16 coumadin 7 mg 04-15-15: inr 2.16 coumadin 7 mg 04-12-15: glucose 101; bun 20; creat  0.99; k+3.3; na++ 140 06-17-15: wbc 12.6; hgb 13.6; hct 41.0; mcv 86.1; plt 282; glucose 137; bun 24; creat 0.90; k+ 3.3; na++137; alk phos  173; albumin 3.1; blood culture: no growth 06-18-15: hgb a1c 6.4 06-20-15: wbv 9.6; hgb 11.6; hct 36.7; mvc 87.4 plt 335 06-22-15: wbc 11.7; hgb 13.1; hct 40.3; mcv 85.4; plt 420; glucose 114; bun 19; creat 0.95; k+ 3.4; na++133 06-25-15: wbc 11.4; hgb 13.1; hct 41.1; mcv 84.7; plt 461; glucose 128; bun 36; creat 1.23; k+ 3.3; na++135; mag 2.1  06-28-15: wbc 11.;5 hgb 13.5; hct 39.6; mcv 82.2; plt 462; glucose 106; bun 40; creat 1.44; k+ 3.6; na++134 07-01-15: INR 2.19 07-29-15; INR 2.68      Review of Systems Constitutional: Negative for appetite change and fatigue.  HENT: Negative for congestion.   Respiratory: Negative for cough, chest tightness and shortness of breath.   Cardiovascular: Negative for chest pain, palpitations and leg swelling.  Gastrointestinal: Negative for nausea, abdominal pain, diarrhea and constipation.  Musculoskeletal: Negative for myalgias and arthralgias.  Skin: Negative for pallor.  Neurological: Negative for dizziness.  Psychiatric/Behavioral: The patient is not nervous/anxious.     Physical Exam Constitutional: He is oriented to person, place, and time. No distress. is obese  Eyes: Conjunctivae are normal.  Neck: Neck supple. No JVD present. No thyromegaly present.  Cardiovascular: Normal rate, regular rhythm and intact distal pulses.   Respiratory: Effort normal and breath sounds normal. No respiratory distress. He has no wheezes.  Has capped trach   GI: Soft. Bowel sounds are normal. He exhibits no distension. There is no tenderness.    Musculoskeletal: He exhibits no edema.  Able to move all extremities   Lymphadenopathy:    He has no cervical adenopathy.  Neurological: He is alert and oriented to person, place, and time.  Skin: Skin is warm and dry. He is not diaphoretic. Left pinna is hot red swollen and  tender to touch.   Psychiatric: He has a normal mood and affect.     ASSESSMENT/ PLAN:  1. Chronic pain: his pain is presently being managed with lidoderm patch to right shoulder; will not make changes will monitor his status   2. COPD; chronic respiratory failure; obesity hypoventilation syndrome:   is presenlty stable is trach dependent is presently not on medications;   3. Diastolic heart failure: will continue lasix 40 mg daily zaroxolyn 5 mg daily   4. Hypokalemia: will continue  k+  40 meq 4 times daily    5. Hypertension: will continue hydralazine 37.5 mg twice daily; isordil 20 mg twice daily  6. Jerrye Bushy: will continue reglan 10 mg four times daily and pepcid 20 mg daily take zofran 4 mg four times daily for nausea   7. Constipation: will continue miralax daily colace twice daily   8. Depression with anxiety: will continue zoloft 100 mg daily; take klonopin 0.5 mg three times daily for anxiety  9.  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 Left ear cellulitis: will begin doxycycline 100 mg twice daily for 2 weeks with florastor twice daily         Ok Edwards NP [facility] Adult [facility]  Contact [phone] Monday through Friday 8am- 5pm  After hours call [phone]

## 2015-09-12 ENCOUNTER — Non-Acute Institutional Stay: Payer: BLUE CROSS/BLUE SHIELD | Admitting: Adult Health

## 2015-09-12 DIAGNOSIS — E662 Morbid (severe) obesity with alveolar hypoventilation: Secondary | ICD-10-CM | POA: Diagnosis not present

## 2015-09-12 DIAGNOSIS — I5032 Chronic diastolic (congestive) heart failure: Secondary | ICD-10-CM

## 2015-09-12 DIAGNOSIS — I82501 Chronic embolism and thrombosis of unspecified deep veins of right lower extremity: Secondary | ICD-10-CM

## 2015-09-12 DIAGNOSIS — J9611 Chronic respiratory failure with hypoxia: Secondary | ICD-10-CM | POA: Diagnosis not present

## 2015-09-12 DIAGNOSIS — Z930 Tracheostomy status: Secondary | ICD-10-CM

## 2015-09-12 DIAGNOSIS — I10 Essential (primary) hypertension: Secondary | ICD-10-CM

## 2015-09-12 DIAGNOSIS — F418 Other specified anxiety disorders: Secondary | ICD-10-CM | POA: Diagnosis not present

## 2015-09-20 ENCOUNTER — Non-Acute Institutional Stay: Payer: BLUE CROSS/BLUE SHIELD | Admitting: Adult Health

## 2015-09-20 DIAGNOSIS — I10 Essential (primary) hypertension: Secondary | ICD-10-CM

## 2015-09-20 DIAGNOSIS — E785 Hyperlipidemia, unspecified: Secondary | ICD-10-CM | POA: Diagnosis not present

## 2015-10-03 ENCOUNTER — Non-Acute Institutional Stay: Payer: BLUE CROSS/BLUE SHIELD | Admitting: Adult Health

## 2015-10-03 DIAGNOSIS — N412 Abscess of prostate: Secondary | ICD-10-CM | POA: Diagnosis not present

## 2015-10-03 DIAGNOSIS — H6002 Abscess of left external ear: Secondary | ICD-10-CM | POA: Diagnosis not present

## 2015-10-03 DIAGNOSIS — H60392 Other infective otitis externa, left ear: Secondary | ICD-10-CM

## 2015-10-14 ENCOUNTER — Encounter: Payer: Self-pay | Admitting: Adult Health

## 2015-10-14 ENCOUNTER — Non-Acute Institutional Stay: Payer: BLUE CROSS/BLUE SHIELD | Admitting: Adult Health

## 2015-10-14 DIAGNOSIS — H60392 Other infective otitis externa, left ear: Secondary | ICD-10-CM

## 2015-10-14 DIAGNOSIS — H6002 Abscess of left external ear: Secondary | ICD-10-CM | POA: Diagnosis not present

## 2015-10-14 NOTE — Progress Notes (Signed)
Patient ID: [name], male   DOB: 1951-06-11, 65 y.o.   MRN: [id number]    Facility: [facility]       No Known Allergies  Chief Complaint  Patient presents with  . Medical Management of Chronic Issues    HPI:  He is a resident of this facility being seen for the management of his chronic illnesses. Overall his status is stable. He is not voicing any concerns or complaints at this time. He did tell me that he is feeling good. There are no nursing concerns at this time.    Past Medical History  Diagnosis Date  . Arthritis   . RCT (rotator cuff tear)   . Multiple abrasions     rt arm  . Morbid obesity ([place])   . Hypertension   . Hyperlipidemia   . COPD (chronic obstructive pulmonary disease) ([place])   . CHF (congestive heart failure) [facility])     Past Surgical History  Procedure Laterality Date  . No past surgeries    . Shoulder arthroscopy with subacromial decompression, rotator cuff repair and bicep tendon repair Right 12/28/2014    Procedure: RIGHT SHOULDER ARTHROSCOPY WITH SUBACROMIAL DECOMPRESSION, DISTAL CLAVICLE RESECTION, ROTATOR CUFF REPAIR ;  Surgeon: Sydnee Cabal, MD;  Location: [facility];  Service: Orthopedics;  Laterality: Right;  . Tracheostomy tube placement N/A 01/16/2015    Procedure: TRACHEOSTOMY;  Surgeon: Jodi Marble, MD;  Location: [facility];  Service: ENT;  Laterality: N/A;  . Tracheostomy revision N/A 01/18/2015    Procedure: TRACHEOSTOMY REVISION;  Surgeon: Ruby Cola, MD;  Location: [place];  Service: ENT;  Laterality: N/A;  . Tracheostomy tube placement N/A 01/22/2015    Procedure: TRACHEOSTOMY;  Surgeon: Jodi Marble, MD;  Location: [place];  Service: ENT;  Laterality: N/A;  . [id number]      VITAL SIGNS BP 118/80 mmHg  Pulse 78  Ht 6' (1.829 m)  Wt 252 lb (114.306 kg)  BMI 34.17 kg/m2  SpO2 96%  Patient's Medications  New Prescriptions   No medications on file  Previous Medications   CLONAZEPAM (KLONOPIN) 0.5 MG TABLET    Take 0.5 mg by mouth at  bedtime.   DOCUSATE SODIUM (COLACE) 100 MG CAPSULE    Take 1 capsule (100 mg total) by mouth 2 (two) times daily.   DULOXETINE (CYMBALTA) 60 MG CAPSULE    Take 60 mg by mouth daily.   FAMOTIDINE (PEPCID) 20 MG TABLET    Take 20 mg by mouth daily.   FOLIC ACID (FOLVITE) 1 MG TABLET    Take 1 mg by mouth daily.   FUROSEMIDE (LASIX) 40 MG TABLET    Take 1 tablet (40 mg total) by mouth daily.   HYDRALAZINE (APRESOLINE) 25 MG TABLET    Take 37.5 mg by mouth 2 (two) times daily.   IPRATROPIUM-ALBUTEROL (DUONEB) 0.5-2.5 (3) MG/3ML SOLN    Take 3 mLs by nebulization every 6 (six) hours as needed. For shortness of breath   ISOSORBIDE MONONITRATE (ISMO,MONOKET) 20 MG TABLET    Take 20 mg by mouth 2 (two) times daily at 10 AM and 5 PM.   LIDOCAINE (LIDODERM) 5 %    Place 1 patch onto the skin daily. Remove & Discard patch within 12 hours or as directed by MD To right shoulder   METOCLOPRAMIDE (REGLAN) 10 MG TABLET    Take 10 mg by mouth 4 (four) times daily -  before meals and at bedtime.   METOLAZONE (ZAROXOLYN) 5 MG TABLET  Take 5 mg by mouth daily.   OMEGA-3 FATTY ACIDS (FISH OIL) 1000 MG CAPS    Take 1,000 mg by mouth daily.    ONDANSETRON (ZOFRAN) 4 MG TABLET    Take 4 mg by mouth every 6 (six) hours.   POLYETHYLENE GLYCOL (MIRALAX / GLYCOLAX) PACKET    Take 17 g by mouth daily.   POTASSIUM CHLORIDE SA (K-DUR,KLOR-CON) 20 MEQ TABLET    Take 2 tablets (40 mEq total) by mouth 4 (four) times daily.   SERTRALINE (ZOLOFT) 100 MG TABLET    Take 100 mg by mouth daily. Reported on 10/14/2015   TRAMADOL (ULTRAM) 50 MG TABLET    Take 50 mg by mouth every 6 (six) hours as needed for moderate pain.   WARFARIN (COUMADIN) 6 MG TABLET    Take 6 mg by mouth daily at 6 PM.  Modified Medications   No medications on file  Discontinued Medications     SIGNIFICANT DIAGNOSTIC EXAMS   12-29-14: testicle ultrasound: Two solid extratesticular right scrotal masses. Primary differential diagnosis for extratesticular  masses include primary or metastatic neoplasms including adenomatoid tumor, fibroma, leiomyoma, mesothelioma, or sarcoma. Urology consultation is recommended. No sonographic evidence for testicular torsion or intratesticular mass.  01-01-15: 2-d echo: Left ventricle: The cavity size was normal. There was mild focal basal and mild concentric hypertrophy of the septum. Systolic function was normal. The estimated ejection fraction was in the range of 60% to 65%. Wall motion was normal; there were no regional wall motion abnormalities. Doppler parameters are consistent with abnormal left ventricular relaxation (grade 1 diastolic dysfunction). There was no evidence of elevate ventricular filling pressure by Doppler parameters. - Aortic valve: There was no regurgitation. - Aortic root: The aortic root was normal in size. - Mitral valve: Structurally normal valve. There was no regurgitation. - Right ventricle: Systolic function was normal. - Right atrium: The atrium was normal in size. - Tricuspid valve: There was trivial regurgitation. - Pulmonary arteries: Systolic pressure was within the normal range.   01-07-15: renal ultrasound: Limited examination demonstrating no acute abnormality. Negative for Hydronephrosis. Possible nonobstructing stone lower pole left kidney.  01-18-15: IVC filter placement: Successful IVC filter placement. This is temporary or can remain in place to become permanent.  01-28-15: ct of abdomen: 1. No significant colonic interposition between the anterior abdominal wall and stomach. 2. Hepatic steatosis. 3. Bilateral nephrolithiasis. Possible left UPJ stenosis without secondary signs of ureteral obstruction. 4. Cardiomegaly, bibasilar atelectasis and a small amount of retrosternal air, likely related to previous thoracic intervention.   01-28-15: ct of head: 1. No acute intracranial findings demonstrated. Mild atrophy and periventricular white matter disease. 2. Bilateral  mastoid effusions with opacification of the left middle ear.  06-17-15: chest x-ray; Cardiomegaly with mild pulmonary vascular congestion  06-17-15: ct of abdomen and pelvis: 1. Peripherally enhancing fluid collection or collections centered about the right-side of the prostate and pelvic cul-de-sac. Suspicious for abscess/ abscesses. 2. Air within the urinary bladder and left renal collecting system/ureter. Suspicious for ascending infection. Edema in the left hemipelvis, along the course of the left ureter, is likely secondary. 3. Fluid-filled large and small bowel loops are suspicious for adynamic ileus. No focal transition point identified. 4. Bilateral nephrolithiasis. 5.  Atherosclerosis, including within the coronary arteries. 6. Mild motion degradation.  06-19-15: ct guided prostate abscess drain: Fluid collection along the right side of the prostate compatible with an abscess. 30 mL of purulent fluid was removed.  06-24-15: ct of abdomen  and pelvis: Fatty infiltration of the liver. Bilateral nonobstructive nephrolithiasis. Atherosclerosis of abdominal aorta without aneurysm formation. Sigmoid diverticulosis is noted without inflammation. Interval placement of transgluteal drainage catheter into right side of the pelvis. Fluid collection noted adjacent to prostate gland on prior exam has been completely decompressed.  06-25-15: kub: Moderate ileus pattern.    LABS REVIEWED:   12-31-14: hgb a1c 7.5; 01-23-15: hgb a1c 7.2  01-31-15: vit b12: 304 02-13-15: hgb a1c 6.3 03-14-15: wbc 8.5; hgb 12.;1 hct 36.3; mcv 93.1; plt 251; glucose 112; bun 20; creat 0.82; k+3.0; na++135; phos 3.7; mag 1.2; albumin 3.2 03-25-15: wbc 8.5; hgb 11.7; hct 36.4; mcv 95.0; plt 240; glucose 111; bun 20; creat 1.01; k+3.4; na++138  04-01-15: glucose 112; bun 19; creat 0.97; k+3.3; na++139 04-03-15: inr 1.63 coumadin 6 mg 04-09-15: inr 2.16 coumadin 7 mg 04-15-15: inr 2.16 coumadin 7 mg 04-12-15: glucose 101; bun  20; creat 0.99; k+3.3; na++ 140 06-17-15: wbc 12.6; hgb 13.6; hct 41.0; mcv 86.1; plt 282; glucose 137; bun 24; creat 0.90; k+ 3.3; na++137; alk phos  173; albumin 3.1; blood culture: no growth 06-18-15: hgb a1c 6.4 06-20-15: wbv 9.6; hgb 11.6; hct 36.7; mvc 87.4 plt 335 06-22-15: wbc 11.7; hgb 13.1; hct 40.3; mcv 85.4; plt 420; glucose 114; bun 19; creat 0.95; k+ 3.4; na++133 06-25-15: wbc 11.4; hgb 13.1; hct 41.1; mcv 84.7; plt 461; glucose 128; bun 36; creat 1.23; k+ 3.3; na++135; mag 2.1  06-28-15: wbc 11.;5 hgb 13.5; hct 39.6; mcv 82.2; plt 462; glucose 106; bun 40; creat 1.44; k+ 3.6; na++134 07-01-15: INR 2.19 07-29-15; INR 2.68      Review of Systems Constitutional: Negative for appetite change and fatigue.  HENT: Negative for congestion.   Respiratory: Negative for cough, chest tightness and shortness of breath.   Cardiovascular: Negative for chest pain, palpitations and leg swelling.  Gastrointestinal: Negative for nausea, abdominal pain, diarrhea and constipation.  Musculoskeletal: Negative for myalgias and arthralgias.  Skin: Negative for pallor.  Neurological: Negative for dizziness.  Psychiatric/Behavioral: The patient is not nervous/anxious.     Physical Exam Constitutional: He is oriented to person, place, and time. No distress. is obese  Eyes: Conjunctivae are normal.  Neck: Neck supple. No JVD present. No thyromegaly present.  Cardiovascular: Normal rate, regular rhythm and intact distal pulses.   Respiratory: Effort normal and breath sounds normal. No respiratory distress. He has no wheezes.  Has capped trach   GI: Soft. Bowel sounds are normal. He exhibits no distension. There is no tenderness.    Musculoskeletal:trace right lower leg edema.  Able to move all extremities  Bilateral lower extremities discolored from impaired circulation   Lymphadenopathy:    He has no cervical adenopathy.  Neurological: He is alert and oriented to person, place, and time.    Skin: Skin is warm and dry. He is not diaphoretic.   Psychiatric: He has a normal mood and affect.     ASSESSMENT/ PLAN:  1. Chronic pain: his pain is presently being managed with lidoderm patch to right shoulder; will not make changes will monitor his status   2. COPD; chronic respiratory failure; obesity hypoventilation syndrome:   is presenlty stable is trach dependent is presently not on medications;   3. Diastolic heart failure: will continue lasix 40 mg daily zaroxolyn 5 mg daily   4. Hypokalemia: will continue  k+  40 meq 4 times daily    5. Hypertension: will continue hydralazine 37.5 mg twice daily; isordil 20 mg twice  daily   6. Jerrye Bushy: will continue reglan 10 mg four times daily and pepcid 20 mg daily take zofran 4 mg four times daily for nausea   7. Constipation: will continue miralax daily colace twice daily   8. Depression with anxiety: will continue cymbalta 60 mg  daily; take klonopin 0.5 mg nightly  for anxiety  9.  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

## 2015-11-10 ENCOUNTER — Encounter: Payer: Self-pay | Admitting: Adult Health

## 2015-11-10 DIAGNOSIS — H60392 Other infective otitis externa, left ear: Secondary | ICD-10-CM | POA: Insufficient documentation

## 2015-11-10 DIAGNOSIS — E785 Hyperlipidemia, unspecified: Secondary | ICD-10-CM | POA: Insufficient documentation

## 2015-11-10 NOTE — Progress Notes (Signed)
Patient ID: [name], male   DOB: 11-28-1950, 65 y.o.   MRN: [id number]   Facility: [facility]       No Known Allergies  Chief Complaint  Patient presents with  . Acute Visit    left ear lobe infection     HPI:  Staff reports that his left ear lobe is swollen and red there is drainage present. There are no reports of fever. He does have pain in his left ear lobe.   Past Medical History  Diagnosis Date  . Arthritis   . RCT (rotator cuff tear)   . Multiple abrasions     rt arm  . Morbid obesity ([place])   . Hypertension   . Hyperlipidemia   . COPD (chronic obstructive pulmonary disease) ([place])   . CHF (congestive heart failure) [facility])     Past Surgical History  Procedure Laterality Date  . No past surgeries    . Shoulder arthroscopy with subacromial decompression, rotator cuff repair and bicep tendon repair Right 12/28/2014    Procedure: RIGHT SHOULDER ARTHROSCOPY WITH SUBACROMIAL DECOMPRESSION, DISTAL CLAVICLE RESECTION, ROTATOR CUFF REPAIR ;  Surgeon: Sydnee Cabal, MD;  Location: [facility];  Service: Orthopedics;  Laterality: Right;  . Tracheostomy tube placement N/A 01/16/2015    Procedure: TRACHEOSTOMY;  Surgeon: Jodi Marble, MD;  Location: [place];  Service: ENT;  Laterality: N/A;  . Tracheostomy revision N/A 01/18/2015    Procedure: TRACHEOSTOMY REVISION;  Surgeon: Ruby Cola, MD;  Location: [place];  Service: ENT;  Laterality: N/A;  . Tracheostomy tube placement N/A 01/22/2015    Procedure: TRACHEOSTOMY;  Surgeon: Jodi Marble, MD;  Location: [place];  Service: ENT;  Laterality: N/A;  . [id number]      VITAL SIGNS BP 108/50 mmHg  Pulse 78  Ht 6' (1.829 m)  Wt 245 lb (111.131 kg)  BMI 33.22 kg/m2  SpO2 96%  Patient's Medications  New Prescriptions   No medications on file  Previous Medications   ATORVASTATIN (LIPITOR) 20 MG TABLET    Take 20 mg by mouth daily.   CLONAZEPAM (KLONOPIN) 0.5 MG TABLET    Take 0.5 mg by mouth at bedtime.   DOCUSATE SODIUM (COLACE)  100 MG CAPSULE    Take 1 capsule (100 mg total) by mouth 2 (two) times daily.   DULOXETINE (CYMBALTA) 60 MG CAPSULE    Take 60 mg by mouth daily.   FAMOTIDINE (PEPCID) 20 MG TABLET    Take 20 mg by mouth daily.   FOLIC ACID (FOLVITE) 1 MG TABLET    Take 1 mg by mouth daily.   FUROSEMIDE (LASIX) 40 MG TABLET    Take 1 tablet (40 mg total) by mouth daily.   HYDRALAZINE (APRESOLINE) 25 MG TABLET    Take 37.5 mg by mouth 3 (three) times daily.    IPRATROPIUM-ALBUTEROL (DUONEB) 0.5-2.5 (3) MG/3ML SOLN    Take 3 mLs by nebulization every 6 (six) hours as needed. For shortness of breath   ISOSORBIDE MONONITRATE (ISMO,MONOKET) 20 MG TABLET    Take 20 mg by mouth 2 (two) times daily at 10 AM and 5 PM.   LIDOCAINE (LIDODERM) 5 %    Place 1 patch onto the skin daily. Remove & Discard patch within 12 hours or as directed by MD To right shoulder   METOCLOPRAMIDE (REGLAN) 10 MG TABLET    Take 10 mg by mouth 4 (four) times daily -  before meals and at bedtime.   METOLAZONE (ZAROXOLYN) 5 MG TABLET  Take 5 mg by mouth daily.   OMEGA-3 FATTY ACIDS (FISH OIL) 1000 MG CAPS    Take 1,000 mg by mouth daily.    ONDANSETRON (ZOFRAN) 4 MG TABLET    Take 4 mg by mouth every 6 (six) hours.   POLYETHYLENE GLYCOL (MIRALAX / GLYCOLAX) PACKET    Take 17 g by mouth daily.   POTASSIUM CHLORIDE SA (K-DUR,KLOR-CON) 20 MEQ TABLET    Take 2 tablets (40 mEq total) by mouth 4 (four) times daily.   SERTRALINE (ZOLOFT) 100 MG TABLET    Take 100 mg by mouth daily. Reported on 10/14/2015   TRAMADOL (ULTRAM) 50 MG TABLET    Take 50 mg by mouth every 6 (six) hours as needed for moderate pain.   WARFARIN (COUMADIN) 6 MG TABLET    Take 6 mg by mouth daily at 6 PM.  Modified Medications   No medications on file  Discontinued Medications   No medications on file     SIGNIFICANT DIAGNOSTIC EXAMS    12-29-14: testicle ultrasound: Two solid extratesticular right scrotal masses. Primary differential diagnosis for extratesticular masses  include primary or metastatic neoplasms including adenomatoid tumor, fibroma, leiomyoma, mesothelioma, or sarcoma. Urology consultation is recommended. No sonographic evidence for testicular torsion or intratesticular mass.  01-01-15: 2-d echo: Left ventricle: The cavity size was normal. There was mild focal basal and mild concentric hypertrophy of the septum. Systolic function was normal. The estimated ejection fraction was in the range of 60% to 65%. Wall motion was normal; there were no regional wall motion abnormalities. Doppler parameters are consistent with abnormal left ventricular relaxation (grade 1 diastolic dysfunction). There was no evidence of elevate ventricular filling pressure by Doppler parameters. - Aortic valve: There was no regurgitation. - Aortic root: The aortic root was normal in size. - Mitral valve: Structurally normal valve. There was no regurgitation. - Right ventricle: Systolic function was normal. - Right atrium: The atrium was normal in size. - Tricuspid valve: There was trivial regurgitation. - Pulmonary arteries: Systolic pressure was within the normal range.   01-07-15: renal ultrasound: Limited examination demonstrating no acute abnormality. Negative for Hydronephrosis. Possible nonobstructing stone lower pole left kidney.  01-18-15: IVC filter placement: Successful IVC filter placement. This is temporary or can remain in place to become permanent.  01-28-15: ct of abdomen: 1. No significant colonic interposition between the anterior abdominal wall and stomach. 2. Hepatic steatosis. 3. Bilateral nephrolithiasis. Possible left UPJ stenosis without secondary signs of ureteral obstruction. 4. Cardiomegaly, bibasilar atelectasis and a small amount of retrosternal air, likely related to previous thoracic intervention.   01-28-15: ct of head: 1. No acute intracranial findings demonstrated. Mild atrophy and periventricular white matter disease. 2. Bilateral mastoid  effusions with opacification of the left middle ear.  06-17-15: chest x-ray; Cardiomegaly with mild pulmonary vascular congestion  06-17-15: ct of abdomen and pelvis: 1. Peripherally enhancing fluid collection or collections centered about the right-side of the prostate and pelvic cul-de-sac. Suspicious for abscess/ abscesses. 2. Air within the urinary bladder and left renal collecting system/ureter. Suspicious for ascending infection. Edema in the left hemipelvis, along the course of the left ureter, is likely secondary. 3. Fluid-filled large and small bowel loops are suspicious for adynamic ileus. No focal transition point identified. 4. Bilateral nephrolithiasis. 5.  Atherosclerosis, including within the coronary arteries. 6. Mild motion degradation.  06-19-15: ct guided prostate abscess drain: Fluid collection along the right side of the prostate compatible with an abscess. 30 mL of purulent fluid  was removed.  06-24-15: ct of abdomen and pelvis: Fatty infiltration of the liver. Bilateral nonobstructive nephrolithiasis. Atherosclerosis of abdominal aorta without aneurysm formation. Sigmoid diverticulosis is noted without inflammation. Interval placement of transgluteal drainage catheter into right side of the pelvis. Fluid collection noted adjacent to prostate gland on prior exam has been completely decompressed.  06-25-15: kub: Moderate ileus pattern.    LABS REVIEWED:   12-31-14: hgb a1c 7.5; 01-23-15: hgb a1c 7.2  01-31-15: vit b12: 304 02-13-15: hgb a1c 6.3 03-14-15: wbc 8.5; hgb 12.;1 hct 36.3; mcv 93.1; plt 251; glucose 112; bun 20; creat 0.82; k+3.0; na++135; phos 3.7; mag 1.2; albumin 3.2 03-25-15: wbc 8.5; hgb 11.7; hct 36.4; mcv 95.0; plt 240; glucose 111; bun 20; creat 1.01; k+3.4; na++138  04-01-15: glucose 112; bun 19; creat 0.97; k+3.3; na++139 04-03-15: inr 1.63 coumadin 6 mg 04-09-15: inr 2.16 coumadin 7 mg 04-15-15: inr 2.16 coumadin 7 mg 04-12-15: glucose 101; bun 20; creat  0.99; k+3.3; na++ 140 06-17-15: wbc 12.6; hgb 13.6; hct 41.0; mcv 86.1; plt 282; glucose 137; bun 24; creat 0.90; k+ 3.3; na++137; alk phos  173; albumin 3.1; blood culture: no growth 06-18-15: hgb a1c 6.4 06-20-15: wbv 9.6; hgb 11.6; hct 36.7; mvc 87.4 plt 335 06-22-15: wbc 11.7; hgb 13.1; hct 40.3; mcv 85.4; plt 420; glucose 114; bun 19; creat 0.95; k+ 3.4; na++133 06-25-15: wbc 11.4; hgb 13.1; hct 41.1; mcv 84.7; plt 461; glucose 128; bun 36; creat 1.23; k+ 3.3; na++135; mag 2.1  06-28-15: wbc 11.;5 hgb 13.5; hct 39.6; mcv 82.2; plt 462; glucose 106; bun 40; creat 1.44; k+ 3.6; na++134 07-01-15: INR 2.19 07-29-15; INR 2.68  09-13-15: chol 263; ldl 175; trig 188; hdl 50; hgb a1c 5.5      Review of Systems Constitutional: Negative for appetite change and fatigue.  HENT: Negative for congestion.   Respiratory: Negative for cough, chest tightness and shortness of breath.   Cardiovascular: Negative for chest pain, palpitations and leg swelling.  Gastrointestinal: Negative for nausea, abdominal pain, diarrhea and constipation.  Musculoskeletal: Negative for myalgias and arthralgias.  Skin: Negative for pallor.  Neurological: Negative for dizziness.  Psychiatric/Behavioral: The patient is not nervous/anxious.     Physical Exam Constitutional: He is oriented to person, place, and time. No distress. is obese  Eyes: Conjunctivae are normal.  Neck: Neck supple. No JVD present. No thyromegaly present.  Cardiovascular: Normal rate, regular rhythm and intact distal pulses.   Respiratory: Effort normal and breath sounds normal. No respiratory distress. He has no wheezes.  Has capped trach   GI: Soft. Bowel sounds are normal. He exhibits no distension. There is no tenderness.    Musculoskeletal:trace right lower leg edema.  Able to move all extremities  Bilateral lower extremities discolored from impaired circulation   Lymphadenopathy:    He has no cervical adenopathy.  Neurological: He is  alert and oriented to person, place, and time.  Skin: Skin is warm and dry. He is not diaphoretic. Left ear lobe inflamed; red swollen with purulent drainage present   Psychiatric: He has a normal mood and affect.        ASSESSMENT/ PLAN:  1. Left ear lobe infection: will begin doxycycline 100 mg twice daily for 10 days with florastor twice daily  Will use warm packs. Will check inr three times weekly while on abt.     Ok Edwards NP [facility]  Contact [phone] Monday through Friday 8am- 5pm  After hours call [phone]

## 2015-11-10 NOTE — Progress Notes (Signed)
Patient ID: [name], male   DOB: [birth year], 65 y.o.   MRN: [id number]   Facility: [facility]       No Known Allergies  Chief Complaint  Patient presents with  . Acute Visit    hypertension and lab work     HPI:  His blood pressure reading have been elevated and he will require his medications to be adjusted. His lipid profile is elevated and he will require statin therapy. He is not voicing any complaints or concerns at this time.   Past Medical History  Diagnosis Date  . Arthritis   . RCT (rotator cuff tear)   . Multiple abrasions     rt arm  . Morbid obesity ([place])   . Hypertension   . Hyperlipidemia   . COPD (chronic obstructive pulmonary disease) ([place])   . CHF (congestive heart failure) [facility])     Past Surgical History  Procedure Laterality Date  . No past surgeries    . Shoulder arthroscopy with subacromial decompression, rotator cuff repair and bicep tendon repair Right 12/28/2014    Procedure: RIGHT SHOULDER ARTHROSCOPY WITH SUBACROMIAL DECOMPRESSION, DISTAL CLAVICLE RESECTION, ROTATOR CUFF REPAIR ;  Surgeon: Sydnee Cabal, MD;  Location: [facility];  Service: Orthopedics;  Laterality: Right;  . Tracheostomy tube placement N/A 01/16/2015    Procedure: TRACHEOSTOMY;  Surgeon: Jodi Marble, MD;  Location: [place];  Service: ENT;  Laterality: N/A;  . Tracheostomy revision N/A 01/18/2015    Procedure: TRACHEOSTOMY REVISION;  Surgeon: Ruby Cola, MD;  Location: [place];  Service: ENT;  Laterality: N/A;  . Tracheostomy tube placement N/A 01/22/2015    Procedure: TRACHEOSTOMY;  Surgeon: Jodi Marble, MD;  Location: [place];  Service: ENT;  Laterality: N/A;  . [id number]      VITAL SIGNS BP 160/80 mmHg  Pulse 78  Temp(Src) 97.4 F (36.3 C)  Ht 6' (1.829 m)  Wt 252 lb (114.306 kg)  BMI 34.17 kg/m2  SpO2 94%  Patient's Medications  New Prescriptions   No medications on file  Previous Medications   CLONAZEPAM (KLONOPIN) 0.5 MG TABLET    Take 0.5 mg by mouth at  bedtime.   DOCUSATE SODIUM (COLACE) 100 MG CAPSULE    Take 1 capsule (100 mg total) by mouth 2 (two) times daily.   DULOXETINE (CYMBALTA) 60 MG CAPSULE    Take 60 mg by mouth daily.   FAMOTIDINE (PEPCID) 20 MG TABLET    Take 20 mg by mouth daily.   FOLIC ACID (FOLVITE) 1 MG TABLET    Take 1 mg by mouth daily.   FUROSEMIDE (LASIX) 40 MG TABLET    Take 1 tablet (40 mg total) by mouth daily.   HYDRALAZINE (APRESOLINE) 25 MG TABLET    Take 37.5 mg by mouth 2 (two) times daily.   IPRATROPIUM-ALBUTEROL (DUONEB) 0.5-2.5 (3) MG/3ML SOLN    Take 3 mLs by nebulization every 6 (six) hours as needed. For shortness of breath   ISOSORBIDE MONONITRATE (ISMO,MONOKET) 20 MG TABLET    Take 20 mg by mouth 2 (two) times daily at 10 AM and 5 PM.   LIDOCAINE (LIDODERM) 5 %    Place 1 patch onto the skin daily. Remove & Discard patch within 12 hours or as directed by MD To right shoulder   METOCLOPRAMIDE (REGLAN) 10 MG TABLET    Take 10 mg by mouth 4 (four) times daily -  before meals and at bedtime.   METOLAZONE (ZAROXOLYN) 5 MG TABLET    Take  5 mg by mouth daily.   OMEGA-3 FATTY ACIDS (FISH OIL) 1000 MG CAPS    Take 1,000 mg by mouth daily.    ONDANSETRON (ZOFRAN) 4 MG TABLET    Take 4 mg by mouth every 6 (six) hours.   POLYETHYLENE GLYCOL (MIRALAX / GLYCOLAX) PACKET    Take 17 g by mouth daily.   POTASSIUM CHLORIDE SA (K-DUR,KLOR-CON) 20 MEQ TABLET    Take 2 tablets (40 mEq total) by mouth 4 (four) times daily.   SERTRALINE (ZOLOFT) 100 MG TABLET    Take 100 mg by mouth daily. Reported on 10/14/2015   TRAMADOL (ULTRAM) 50 MG TABLET    Take 50 mg by mouth every 6 (six) hours as needed for moderate pain.   WARFARIN (COUMADIN) 6 MG TABLET    Take 6 mg by mouth daily at 6 PM.  Modified Medications   No medications on file  Discontinued Medications   No medications on file     SIGNIFICANT DIAGNOSTIC EXAMS    12-29-14: testicle ultrasound: Two solid extratesticular right scrotal masses. Primary  differential diagnosis for extratesticular masses include primary or metastatic neoplasms including adenomatoid tumor, fibroma, leiomyoma, mesothelioma, or sarcoma. Urology consultation is recommended. No sonographic evidence for testicular torsion or intratesticular mass.  01-01-15: 2-d echo: Left ventricle: The cavity size was normal. There was mild focal basal and mild concentric hypertrophy of the septum. Systolic function was normal. The estimated ejection fraction was in the range of 60% to 65%. Wall motion was normal; there were no regional wall motion abnormalities. Doppler parameters are consistent with abnormal left ventricular relaxation (grade 1 diastolic dysfunction). There was no evidence of elevate ventricular filling pressure by Doppler parameters. - Aortic valve: There was no regurgitation. - Aortic root: The aortic root was normal in size. - Mitral valve: Structurally normal valve. There was no regurgitation. - Right ventricle: Systolic function was normal. - Right atrium: The atrium was normal in size. - Tricuspid valve: There was trivial regurgitation. - Pulmonary arteries: Systolic pressure was within the normal range.   01-07-15: renal ultrasound: Limited examination demonstrating no acute abnormality. Negative for Hydronephrosis. Possible nonobstructing stone lower pole left kidney.  01-18-15: IVC filter placement: Successful IVC filter placement. This is temporary or can remain in place to become permanent.  01-28-15: ct of abdomen: 1. No significant colonic interposition between the anterior abdominal wall and stomach. 2. Hepatic steatosis. 3. Bilateral nephrolithiasis. Possible left UPJ stenosis without secondary signs of ureteral obstruction. 4. Cardiomegaly, bibasilar atelectasis and a small amount of retrosternal air, likely related to previous thoracic intervention.   01-28-15: ct of head: 1. No acute intracranial findings demonstrated. Mild atrophy and  periventricular white matter disease. 2. Bilateral mastoid effusions with opacification of the left middle ear.  06-17-15: chest x-ray; Cardiomegaly with mild pulmonary vascular congestion  06-17-15: ct of abdomen and pelvis: 1. Peripherally enhancing fluid collection or collections centered about the right-side of the prostate and pelvic cul-de-sac. Suspicious for abscess/ abscesses. 2. Air within the urinary bladder and left renal collecting system/ureter. Suspicious for ascending infection. Edema in the left hemipelvis, along the course of the left ureter, is likely secondary. 3. Fluid-filled large and small bowel loops are suspicious for adynamic ileus. No focal transition point identified. 4. Bilateral nephrolithiasis. 5.  Atherosclerosis, including within the coronary arteries. 6. Mild motion degradation.  06-19-15: ct guided prostate abscess drain: Fluid collection along the right side of the prostate compatible with an abscess. 30 mL of purulent fluid was  removed.  06-24-15: ct of abdomen and pelvis: Fatty infiltration of the liver. Bilateral nonobstructive nephrolithiasis. Atherosclerosis of abdominal aorta without aneurysm formation. Sigmoid diverticulosis is noted without inflammation. Interval placement of transgluteal drainage catheter into right side of the pelvis. Fluid collection noted adjacent to prostate gland on prior exam has been completely decompressed.  06-25-15: kub: Moderate ileus pattern.    LABS REVIEWED:   12-31-14: hgb a1c 7.5; 01-23-15: hgb a1c 7.2  01-31-15: vit b12: 304 02-13-15: hgb a1c 6.3 03-14-15: wbc 8.5; hgb 12.;1 hct 36.3; mcv 93.1; plt 251; glucose 112; bun 20; creat 0.82; k+3.0; na++135; phos 3.7; mag 1.2; albumin 3.2 03-25-15: wbc 8.5; hgb 11.7; hct 36.4; mcv 95.0; plt 240; glucose 111; bun 20; creat 1.01; k+3.4; na++138  04-01-15: glucose 112; bun 19; creat 0.97; k+3.3; na++139 04-03-15: inr 1.63 coumadin 6 mg 04-09-15: inr 2.16 coumadin 7 mg 04-15-15:  inr 2.16 coumadin 7 mg 04-12-15: glucose 101; bun 20; creat 0.99; k+3.3; na++ 140 06-17-15: wbc 12.6; hgb 13.6; hct 41.0; mcv 86.1; plt 282; glucose 137; bun 24; creat 0.90; k+ 3.3; na++137; alk phos  173; albumin 3.1; blood culture: no growth 06-18-15: hgb a1c 6.4 06-20-15: wbv 9.6; hgb 11.6; hct 36.7; mvc 87.4 plt 335 06-22-15: wbc 11.7; hgb 13.1; hct 40.3; mcv 85.4; plt 420; glucose 114; bun 19; creat 0.95; k+ 3.4; na++133 06-25-15: wbc 11.4; hgb 13.1; hct 41.1; mcv 84.7; plt 461; glucose 128; bun 36; creat 1.23; k+ 3.3; na++135; mag 2.1  06-28-15: wbc 11.;5 hgb 13.5; hct 39.6; mcv 82.2; plt 462; glucose 106; bun 40; creat 1.44; k+ 3.6; na++134 07-01-15: INR 2.19 07-29-15; INR 2.68  09-13-15: chol 263; ldl 175; trig 188; hdl 50; hgb a1c 5.5      Review of Systems Constitutional: Negative for appetite change and fatigue.  HENT: Negative for congestion.   Respiratory: Negative for cough, chest tightness and shortness of breath.   Cardiovascular: Negative for chest pain, palpitations and leg swelling.  Gastrointestinal: Negative for nausea, abdominal pain, diarrhea and constipation.  Musculoskeletal: Negative for myalgias and arthralgias.  Skin: Negative for pallor.  Neurological: Negative for dizziness.  Psychiatric/Behavioral: The patient is not nervous/anxious.     Physical Exam Constitutional: He is oriented to person, place, and time. No distress. is obese  Eyes: Conjunctivae are normal.  Neck: Neck supple. No JVD present. No thyromegaly present.  Cardiovascular: Normal rate, regular rhythm and intact distal pulses.   Respiratory: Effort normal and breath sounds normal. No respiratory distress. He has no wheezes.  Has capped trach   GI: Soft. Bowel sounds are normal. He exhibits no distension. There is no tenderness.    Musculoskeletal:trace right lower leg edema.  Able to move all extremities  Bilateral lower extremities discolored from impaired circulation   Lymphadenopathy:     He has no cervical adenopathy.  Neurological: He is alert and oriented to person, place, and time.  Skin: Skin is warm and dry. He is not diaphoretic.   Psychiatric: He has a normal mood and affect.     ASSESSMENT/ PLAN:   1. Hypertension: will continue isordil 20 mg twice daily  Will increase hydralazine to 37.5 mg three times daily   2. Dyslipidemia: will begin lipitor 20 mg daily and will check lipids in 2 months.      Ok Edwards NP [facility]  Contact [phone] Monday through Friday 8am- 5pm  After hours call [phone]

## 2015-11-10 NOTE — Progress Notes (Signed)
Patient ID: [name], male   DOB: 09-19-1950, 65 y.o.   MRN: [id number]   Facility: [facility]       No Known Allergies  Chief Complaint  Patient presents with  . Acute Visit    left ear lobe infection     HPI:  He continues to have drainage from his left ear lobe and is due to complete his doxycycline today. I was able to express a large amount drainage out of the cyst. There are no fevers present. He states that he is feeling better. He denies any pain.    Past Medical History  Diagnosis Date  . Arthritis   . RCT (rotator cuff tear)   . Multiple abrasions     rt arm  . Morbid obesity ([place])   . Hypertension   . Hyperlipidemia   . COPD (chronic obstructive pulmonary disease) ([place])   . CHF (congestive heart failure) [facility])     Past Surgical History  Procedure Laterality Date  . No past surgeries    . Shoulder arthroscopy with subacromial decompression, rotator cuff repair and bicep tendon repair Right 12/28/2014    Procedure: RIGHT SHOULDER ARTHROSCOPY WITH SUBACROMIAL DECOMPRESSION, DISTAL CLAVICLE RESECTION, ROTATOR CUFF REPAIR ;  Surgeon: Sydnee Cabal, MD;  Location: [facility];  Service: Orthopedics;  Laterality: Right;  . Tracheostomy tube placement N/A 01/16/2015    Procedure: TRACHEOSTOMY;  Surgeon: Jodi Marble, MD;  Location: [place];  Service: ENT;  Laterality: N/A;  . Tracheostomy revision N/A 01/18/2015    Procedure: TRACHEOSTOMY REVISION;  Surgeon: Ruby Cola, MD;  Location: [place];  Service: ENT;  Laterality: N/A;  . Tracheostomy tube placement N/A 01/22/2015    Procedure: TRACHEOSTOMY;  Surgeon: Jodi Marble, MD;  Location: [place];  Service: ENT;  Laterality: N/A;  . [id number]      VITAL SIGNS BP 129/70 mmHg  Pulse 76  Temp(Src) 97.4 F (36.3 C)  Ht 6' (1.829 m)  Wt 251 lb (113.853 kg)  BMI 34.03 kg/m2  SpO2 95%  Patient's Medications  New Prescriptions   No medications on file  Previous Medications   ATORVASTATIN (LIPITOR) 20 MG TABLET    Take  20 mg by mouth daily.   CLONAZEPAM (KLONOPIN) 0.5 MG TABLET    Take 0.5 mg by mouth at bedtime.   DOCUSATE SODIUM (COLACE) 100 MG CAPSULE    Take 1 capsule (100 mg total) by mouth 2 (two) times daily.   DULOXETINE (CYMBALTA) 60 MG CAPSULE    Take 60 mg by mouth daily.   FAMOTIDINE (PEPCID) 20 MG TABLET    Take 20 mg by mouth daily.   FOLIC ACID (FOLVITE) 1 MG TABLET    Take 1 mg by mouth daily.   FUROSEMIDE (LASIX) 40 MG TABLET    Take 1 tablet (40 mg total) by mouth daily.   HYDRALAZINE (APRESOLINE) 25 MG TABLET    Take 37.5 mg by mouth 3 (three) times daily.    IPRATROPIUM-ALBUTEROL (DUONEB) 0.5-2.5 (3) MG/3ML SOLN    Take 3 mLs by nebulization every 6 (six) hours as needed. For shortness of breath   ISOSORBIDE MONONITRATE (ISMO,MONOKET) 20 MG TABLET    Take 20 mg by mouth 2 (two) times daily at 10 AM and 5 PM.   LIDOCAINE (LIDODERM) 5 %    Place 1 patch onto the skin daily. Remove & Discard patch within 12 hours or as directed by MD To right shoulder   METOCLOPRAMIDE (REGLAN) 10 MG TABLET  Take 10 mg by mouth 4 (four) times daily -  before meals and at bedtime.   METOLAZONE (ZAROXOLYN) 5 MG TABLET    Take 5 mg by mouth daily.   OMEGA-3 FATTY ACIDS (FISH OIL) 1000 MG CAPS    Take 1,000 mg by mouth daily.    ONDANSETRON (ZOFRAN) 4 MG TABLET    Take 4 mg by mouth every 6 (six) hours.   POLYETHYLENE GLYCOL (MIRALAX / GLYCOLAX) PACKET    Take 17 g by mouth daily.   POTASSIUM CHLORIDE SA (K-DUR,KLOR-CON) 20 MEQ TABLET    Take 2 tablets (40 mEq total) by mouth 4 (four) times daily.   SERTRALINE (ZOLOFT) 100 MG TABLET    Take 100 mg by mouth daily. Reported on 10/14/2015   TRAMADOL (ULTRAM) 50 MG TABLET    Take 50 mg by mouth every 6 (six) hours as needed for moderate pain.   WARFARIN (COUMADIN) 6 MG TABLET    Take 6 mg by mouth daily at 6 PM.  Modified Medications   No medications on file  Discontinued Medications   No medications on file     SIGNIFICANT DIAGNOSTIC EXAMS  12-29-14: testicle  ultrasound: Two solid extratesticular right scrotal masses. Primary differential diagnosis for extratesticular masses include primary or metastatic neoplasms including adenomatoid tumor, fibroma, leiomyoma, mesothelioma, or sarcoma. Urology consultation is recommended. No sonographic evidence for testicular torsion or intratesticular mass.  01-01-15: 2-d echo: Left ventricle: The cavity size was normal. There was mild focal basal and mild concentric hypertrophy of the septum. Systolic function was normal. The estimated ejection fraction was in the range of 60% to 65%. Wall motion was normal; there were no regional wall motion abnormalities. Doppler parameters are consistent with abnormal left ventricular relaxation (grade 1 diastolic dysfunction). There was no evidence of elevate ventricular filling pressure by Doppler parameters. - Aortic valve: There was no regurgitation. - Aortic root: The aortic root was normal in size. - Mitral valve: Structurally normal valve. There was no regurgitation. - Right ventricle: Systolic function was normal. - Right atrium: The atrium was normal in size. - Tricuspid valve: There was trivial regurgitation. - Pulmonary arteries: Systolic pressure was within the normal range.   01-07-15: renal ultrasound: Limited examination demonstrating no acute abnormality. Negative for Hydronephrosis. Possible nonobstructing stone lower pole left kidney.  01-18-15: IVC filter placement: Successful IVC filter placement. This is temporary or can remain in place to become permanent.  01-28-15: ct of abdomen: 1. No significant colonic interposition between the anterior abdominal wall and stomach. 2. Hepatic steatosis. 3. Bilateral nephrolithiasis. Possible left UPJ stenosis without secondary signs of ureteral obstruction. 4. Cardiomegaly, bibasilar atelectasis and a small amount of retrosternal air, likely related to previous thoracic intervention.   01-28-15: ct of head: 1. No  acute intracranial findings demonstrated. Mild atrophy and periventricular white matter disease. 2. Bilateral mastoid effusions with opacification of the left middle ear.  06-17-15: chest x-ray; Cardiomegaly with mild pulmonary vascular congestion  06-17-15: ct of abdomen and pelvis: 1. Peripherally enhancing fluid collection or collections centered about the right-side of the prostate and pelvic cul-de-sac. Suspicious for abscess/ abscesses. 2. Air within the urinary bladder and left renal collecting system/ureter. Suspicious for ascending infection. Edema in the left hemipelvis, along the course of the left ureter, is likely secondary. 3. Fluid-filled large and small bowel loops are suspicious for adynamic ileus. No focal transition point identified. 4. Bilateral nephrolithiasis. 5.  Atherosclerosis, including within the coronary arteries. 6. Mild motion degradation.  06-19-15: ct guided prostate abscess drain: Fluid collection along the right side of the prostate compatible with an abscess. 30 mL of purulent fluid was removed.  06-24-15: ct of abdomen and pelvis: Fatty infiltration of the liver. Bilateral nonobstructive nephrolithiasis. Atherosclerosis of abdominal aorta without aneurysm formation. Sigmoid diverticulosis is noted without inflammation. Interval placement of transgluteal drainage catheter into right side of the pelvis. Fluid collection noted adjacent to prostate gland on prior exam has been completely decompressed.  06-25-15: kub: Moderate ileus pattern.    LABS REVIEWED:   12-31-14: hgb a1c 7.5; 01-23-15: hgb a1c 7.2  01-31-15: vit b12: 304 02-13-15: hgb a1c 6.3 03-14-15: wbc 8.5; hgb 12.;1 hct 36.3; mcv 93.1; plt 251; glucose 112; bun 20; creat 0.82; k+3.0; na++135; phos 3.7; mag 1.2; albumin 3.2 03-25-15: wbc 8.5; hgb 11.7; hct 36.4; mcv 95.0; plt 240; glucose 111; bun 20; creat 1.01; k+3.4; na++138  04-01-15: glucose 112; bun 19; creat 0.97; k+3.3; na++139 04-03-15: inr  1.63 coumadin 6 mg 04-09-15: inr 2.16 coumadin 7 mg 04-15-15: inr 2.16 coumadin 7 mg 04-12-15: glucose 101; bun 20; creat 0.99; k+3.3; na++ 140 06-17-15: wbc 12.6; hgb 13.6; hct 41.0; mcv 86.1; plt 282; glucose 137; bun 24; creat 0.90; k+ 3.3; na++137; alk phos  173; albumin 3.1; blood culture: no growth 06-18-15: hgb a1c 6.4 06-20-15: wbv 9.6; hgb 11.6; hct 36.7; mvc 87.4 plt 335 06-22-15: wbc 11.7; hgb 13.1; hct 40.3; mcv 85.4; plt 420; glucose 114; bun 19; creat 0.95; k+ 3.4; na++133 06-25-15: wbc 11.4; hgb 13.1; hct 41.1; mcv 84.7; plt 461; glucose 128; bun 36; creat 1.23; k+ 3.3; na++135; mag 2.1  06-28-15: wbc 11.;5 hgb 13.5; hct 39.6; mcv 82.2; plt 462; glucose 106; bun 40; creat 1.44; k+ 3.6; na++134 07-01-15: INR 2.19 07-29-15; INR 2.68  09-13-15: chol 263; ldl 175; trig 188; hdl 50; hgb a1c 5.5  10-04-15: wbc 8.4; hgb 13.7; hct 41.4; mcv 82.3; plt 335;  10-14-15; INR 2.52 coumadin 5.5 mg      Review of Systems Constitutional: Negative for appetite change and fatigue.  HENT: Negative for congestion.   Respiratory: Negative for cough, chest tightness and shortness of breath.   Cardiovascular: Negative for chest pain, palpitations and leg swelling.  Gastrointestinal: Negative for nausea, abdominal pain, diarrhea and constipation.  Musculoskeletal: Negative for myalgias and arthralgias.  Skin: Negative for pallor.  Neurological: Negative for dizziness.  Psychiatric/Behavioral: The patient is not nervous/anxious.     Physical Exam Constitutional: He is oriented to person, place, and time. No distress. is obese  Eyes: Conjunctivae are normal.  Neck: Neck supple. No JVD present. No thyromegaly present.  Cardiovascular: Normal rate, regular rhythm and intact distal pulses.   Respiratory: Effort normal and breath sounds normal. No respiratory distress. He has no wheezes.  Has capped trach   GI: Soft. Bowel sounds are normal. He exhibits no distension. There is no tenderness.      Musculoskeletal:trace right lower leg edema.  Able to move all extremities  Bilateral lower extremities discolored from impaired circulation   Lymphadenopathy:    He has no cervical adenopathy.  Neurological: He is alert and oriented to person, place, and time.  Skin: Skin is warm and dry. He is not diaphoretic. Left ear lobe inflamed; red swollen with purulent drainage present   Psychiatric: He has a normal mood and affect.        ASSESSMENT/ PLAN:  1. Left ear lobe infection: will extend the doxycycline for one more week with florastor twice  daily  Will use warm packs. Will check inr three times weekly while on abt.          Ok Edwards NP [facility]  Contact [phone] Monday through Friday 8am- 5pm  After hours call [phone]

## 2015-11-11 ENCOUNTER — Encounter: Payer: Self-pay | Admitting: Adult Health

## 2015-11-11 ENCOUNTER — Non-Acute Institutional Stay: Payer: BLUE CROSS/BLUE SHIELD | Admitting: Adult Health

## 2015-11-11 DIAGNOSIS — Z930 Tracheostomy status: Secondary | ICD-10-CM

## 2015-11-11 DIAGNOSIS — J9611 Chronic respiratory failure with hypoxia: Secondary | ICD-10-CM

## 2015-11-11 DIAGNOSIS — E785 Hyperlipidemia, unspecified: Secondary | ICD-10-CM | POA: Diagnosis not present

## 2015-11-11 DIAGNOSIS — I10 Essential (primary) hypertension: Secondary | ICD-10-CM

## 2015-11-11 DIAGNOSIS — I82501 Chronic embolism and thrombosis of unspecified deep veins of right lower extremity: Secondary | ICD-10-CM

## 2015-11-11 DIAGNOSIS — F418 Other specified anxiety disorders: Secondary | ICD-10-CM | POA: Diagnosis not present

## 2015-11-11 DIAGNOSIS — I5032 Chronic diastolic (congestive) heart failure: Secondary | ICD-10-CM

## 2015-11-11 NOTE — Progress Notes (Signed)
Patient ID: [name], male   DOB: 10-06-1950, 65 y.o.   MRN: [id number]   Facility: [facility]       No Known Allergies  Chief Complaint  Patient presents with  . Medical Management of Chronic Issues    Follow up    HPI:  He is a long term resident of this facility being seen for the management of his chronic illnesses. Overall his status is stable his earlobe has improved without further infections. He is not voicing any complaints or concerns at this time. There are no nursing concerns at this time.   Past Medical History  Diagnosis Date  . Arthritis   . RCT (rotator cuff tear)   . Multiple abrasions     rt arm  . Morbid obesity ([place])   . Hypertension   . Hyperlipidemia   . COPD (chronic obstructive pulmonary disease) ([place])   . CHF (congestive heart failure) [facility])     Past Surgical History  Procedure Laterality Date  . No past surgeries    . Shoulder arthroscopy with subacromial decompression, rotator cuff repair and bicep tendon repair Right 12/28/2014    Procedure: RIGHT SHOULDER ARTHROSCOPY WITH SUBACROMIAL DECOMPRESSION, DISTAL CLAVICLE RESECTION, ROTATOR CUFF REPAIR ;  Surgeon: Sydnee Cabal, MD;  Location: [facility];  Service: Orthopedics;  Laterality: Right;  . Tracheostomy tube placement N/A 01/16/2015    Procedure: TRACHEOSTOMY;  Surgeon: Jodi Marble, MD;  Location: [place];  Service: ENT;  Laterality: N/A;  . Tracheostomy revision N/A 01/18/2015    Procedure: TRACHEOSTOMY REVISION;  Surgeon: Ruby Cola, MD;  Location: [place];  Service: ENT;  Laterality: N/A;  . Tracheostomy tube placement N/A 01/22/2015    Procedure: TRACHEOSTOMY;  Surgeon: Jodi Marble, MD;  Location: [place];  Service: ENT;  Laterality: N/A;  . [id number]      VITAL SIGNS BP 114/72 mmHg  Pulse 80  Temp(Src) 98.2 F (36.8 C) (Oral)  Resp 18  Ht 6' (1.829 m)  Wt 247 lb (112.038 kg)  BMI 33.49 kg/m2  SpO2 95%  Patient's Medications  New Prescriptions   No medications on file    Previous Medications   ATORVASTATIN (LIPITOR) 20 MG TABLET    Take 20 mg by mouth daily.   CLONAZEPAM (KLONOPIN) 0.5 MG TABLET    Take 0.5 mg by mouth at bedtime.   DOCUSATE SODIUM (COLACE) 100 MG CAPSULE    Take 1 capsule (100 mg total) by mouth 2 (two) times daily.   DULOXETINE (CYMBALTA) 60 MG CAPSULE    Take 60 mg by mouth daily.   FAMOTIDINE (PEPCID) 20 MG TABLET    Take 20 mg by mouth daily.   FOLIC ACID (FOLVITE) 1 MG TABLET    Take 1 mg by mouth daily.   FUROSEMIDE (LASIX) 40 MG TABLET    Take 1 tablet (40 mg total) by mouth daily.   HYDRALAZINE (APRESOLINE) 25 MG TABLET    Take 37.5 mg by mouth 3 (three) times daily.    IPRATROPIUM-ALBUTEROL (DUONEB) 0.5-2.5 (3) MG/3ML SOLN    Take 3 mLs by nebulization every 6 (six) hours as needed. For shortness of breath   ISOSORBIDE MONONITRATE (ISMO,MONOKET) 20 MG TABLET    Take 20 mg by mouth 2 (two) times daily at 10 AM and 5 PM.   LIDOCAINE (LIDODERM) 5 %    Place 1 patch onto the skin daily. Remove & Discard patch within 12 hours or as directed by MD To right shoulder   METOCLOPRAMIDE (REGLAN)  10 MG TABLET    Take 10 mg by mouth 4 (four) times daily -  before meals and at bedtime.   METOLAZONE (ZAROXOLYN) 5 MG TABLET    Take 5 mg by mouth daily.   OMEGA-3 FATTY ACIDS (FISH OIL) 1000 MG CAPS    Take 1,000 mg by mouth daily.    ONDANSETRON (ZOFRAN) 4 MG TABLET    Take 4 mg by mouth every 6 (six) hours.   POLYETHYLENE GLYCOL (MIRALAX / GLYCOLAX) PACKET    Take 17 g by mouth daily.   POTASSIUM CHLORIDE SA (K-DUR,KLOR-CON) 20 MEQ TABLET    Take 2 tablets (40 mEq total) by mouth 4 (four) times daily.   TRAMADOL (ULTRAM) 50 MG TABLET    Take 50 mg by mouth every 6 (six) hours as needed for moderate pain.   WARFARIN (COUMADIN) 6 MG TABLET    Take 6 mg by mouth daily at 6 PM.  Modified Medications   No medications on file     SIGNIFICANT DIAGNOSTIC EXAMS  12-29-14: testicle ultrasound: Two solid extratesticular right scrotal masses. Primary  differential diagnosis for extratesticular masses include primary or metastatic neoplasms including adenomatoid tumor, fibroma, leiomyoma, mesothelioma, or sarcoma. Urology consultation is recommended. No sonographic evidence for testicular torsion or intratesticular mass.  01-01-15: 2-d echo: Left ventricle: The cavity size was normal. There was mild focal basal and mild concentric hypertrophy of the septum. Systolic function was normal. The estimated ejection fraction was in the range of 60% to 65%. Wall motion was normal; there were no regional wall motion abnormalities. Doppler parameters are consistent with abnormal left ventricular relaxation (grade 1 diastolic dysfunction). There was no evidence of elevate ventricular filling pressure by Doppler parameters. - Aortic valve: There was no regurgitation. - Aortic root: The aortic root was normal in size. - Mitral valve: Structurally normal valve. There was no regurgitation. - Right ventricle: Systolic function was normal. - Right atrium: The atrium was normal in size. - Tricuspid valve: There was trivial regurgitation. - Pulmonary arteries: Systolic pressure was within the normal range.   01-07-15: renal ultrasound: Limited examination demonstrating no acute abnormality. Negative for Hydronephrosis. Possible nonobstructing stone lower pole left kidney.  01-18-15: IVC filter placement: Successful IVC filter placement. This is temporary or can remain in place to become permanent.  01-28-15: ct of abdomen: 1. No significant colonic interposition between the anterior abdominal wall and stomach. 2. Hepatic steatosis. 3. Bilateral nephrolithiasis. Possible left UPJ stenosis without secondary signs of ureteral obstruction. 4. Cardiomegaly, bibasilar atelectasis and a small amount of retrosternal air, likely related to previous thoracic intervention.   01-28-15: ct of head: 1. No acute intracranial findings demonstrated. Mild atrophy and  periventricular white matter disease. 2. Bilateral mastoid effusions with opacification of the left middle ear.  06-17-15: chest x-ray; Cardiomegaly with mild pulmonary vascular congestion  06-17-15: ct of abdomen and pelvis: 1. Peripherally enhancing fluid collection or collections centered about the right-side of the prostate and pelvic cul-de-sac. Suspicious for abscess/ abscesses. 2. Air within the urinary bladder and left renal collecting system/ureter. Suspicious for ascending infection. Edema in the left hemipelvis, along the course of the left ureter, is likely secondary. 3. Fluid-filled large and small bowel loops are suspicious for adynamic ileus. No focal transition point identified. 4. Bilateral nephrolithiasis. 5.  Atherosclerosis, including within the coronary arteries. 6. Mild motion degradation.  06-19-15: ct guided prostate abscess drain: Fluid collection along the right side of the prostate compatible with an abscess. 30 mL of  purulent fluid was removed.  06-24-15: ct of abdomen and pelvis: Fatty infiltration of the liver. Bilateral nonobstructive nephrolithiasis. Atherosclerosis of abdominal aorta without aneurysm formation. Sigmoid diverticulosis is noted without inflammation. Interval placement of transgluteal drainage catheter into right side of the pelvis. Fluid collection noted adjacent to prostate gland on prior exam has been completely decompressed.  06-25-15: kub: Moderate ileus pattern.    LABS REVIEWED:   12-31-14: hgb a1c 7.5; 01-23-15: hgb a1c 7.2  01-31-15: vit b12: 304 02-13-15: hgb a1c 6.3 03-14-15: wbc 8.5; hgb 12.;1 hct 36.3; mcv 93.1; plt 251; glucose 112; bun 20; creat 0.82; k+3.0; na++135; phos 3.7; mag 1.2; albumin 3.2 03-25-15: wbc 8.5; hgb 11.7; hct 36.4; mcv 95.0; plt 240; glucose 111; bun 20; creat 1.01; k+3.4; na++138  04-01-15: glucose 112; bun 19; creat 0.97; k+3.3; na++139 04-03-15: inr 1.63 coumadin 6 mg 04-09-15: inr 2.16 coumadin 7 mg 04-15-15:  inr 2.16 coumadin 7 mg 04-12-15: glucose 101; bun 20; creat 0.99; k+3.3; na++ 140 06-17-15: wbc 12.6; hgb 13.6; hct 41.0; mcv 86.1; plt 282; glucose 137; bun 24; creat 0.90; k+ 3.3; na++137; alk phos  173; albumin 3.1; blood culture: no growth 06-18-15: hgb a1c 6.4 06-20-15: wbv 9.6; hgb 11.6; hct 36.7; mvc 87.4 plt 335 06-22-15: wbc 11.7; hgb 13.1; hct 40.3; mcv 85.4; plt 420; glucose 114; bun 19; creat 0.95; k+ 3.4; na++133 06-25-15: wbc 11.4; hgb 13.1; hct 41.1; mcv 84.7; plt 461; glucose 128; bun 36; creat 1.23; k+ 3.3; na++135; mag 2.1  06-28-15: wbc 11.;5 hgb 13.5; hct 39.6; mcv 82.2; plt 462; glucose 106; bun 40; creat 1.44; k+ 3.6; na++134 07-01-15: INR 2.19 07-29-15; INR 2.68  11-11-15: inr 2.26: coumadin 6 mg      Review of Systems Constitutional: Negative for appetite change and fatigue.  HENT: Negative for congestion.   Respiratory: Negative for cough, chest tightness and shortness of breath.   Cardiovascular: Negative for chest pain, palpitations and leg swelling.  Gastrointestinal: Negative for nausea, abdominal pain, diarrhea and constipation.  Musculoskeletal: Negative for myalgias and arthralgias.  Skin: Negative for pallor.  Neurological: Negative for dizziness.  Psychiatric/Behavioral: The patient is not nervous/anxious.     Physical Exam Constitutional: He is oriented to person, place, and time. No distress. is obese  Eyes: Conjunctivae are normal.  Neck: Neck supple. No JVD present. No thyromegaly present.  Cardiovascular: Normal rate, regular rhythm and intact distal pulses.   Respiratory: Effort normal and breath sounds normal. No respiratory distress. He has no wheezes.  Has capped trach   GI: Soft. Bowel sounds are normal. He exhibits no distension. There is no tenderness.    Musculoskeletal:trace right lower leg edema.  Able to move all extremities  Bilateral lower extremities discolored from impaired circulation   Lymphadenopathy:    He has no cervical  adenopathy.  Neurological: He is alert and oriented to person, place, and time.  Skin: Skin is warm and dry. He is not diaphoretic.   Psychiatric: He has a normal mood and affect.     ASSESSMENT/ PLAN:  1. Chronic pain: his pain is presently being managed with lidoderm patch to right shoulder; will not make changes will monitor his status   2.  chronic respiratory failure; obesity hypoventilation syndrome:   is presenlty stable is trach dependent is presently not on medications;   3. Diastolic heart failure: will continue lasix 40 mg daily zaroxolyn 5 mg daily   4. Hypokalemia: will continue  k+  40 meq 4 times daily  5. Hypertension: will continue hydralazine 37.5 mg twice daily; isordil 20 mg twice daily   6. Jerrye Bushy: will continue reglan 10 mg four times daily and pepcid 20 mg daily take zofran 4 mg four times daily for nausea   7. Constipation: will continue miralax daily colace twice daily   8. Depression with anxiety: will continue cymbalta 60 mg  daily; take klonopin 0.5 mg nightly  for anxiety  9.  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    Will place him on the eye doctor list and foot doctor list Will setup cologuard             Ok Edwards NP [facility]  Contact [phone] Monday through Friday 8am- 5pm  After hours call [phone]

## 2015-12-12 ENCOUNTER — Non-Acute Institutional Stay: Payer: BLUE CROSS/BLUE SHIELD | Admitting: Adult Health

## 2015-12-12 DIAGNOSIS — J9611 Chronic respiratory failure with hypoxia: Secondary | ICD-10-CM

## 2015-12-12 DIAGNOSIS — Z930 Tracheostomy status: Secondary | ICD-10-CM | POA: Diagnosis not present

## 2015-12-12 DIAGNOSIS — I110 Hypertensive heart disease with heart failure: Secondary | ICD-10-CM

## 2015-12-12 DIAGNOSIS — F418 Other specified anxiety disorders: Secondary | ICD-10-CM | POA: Diagnosis not present

## 2015-12-12 DIAGNOSIS — I5032 Chronic diastolic (congestive) heart failure: Secondary | ICD-10-CM

## 2015-12-12 DIAGNOSIS — I82501 Chronic embolism and thrombosis of unspecified deep veins of right lower extremity: Secondary | ICD-10-CM

## 2015-12-12 DIAGNOSIS — E785 Hyperlipidemia, unspecified: Secondary | ICD-10-CM

## 2015-12-13 ENCOUNTER — Encounter: Payer: Self-pay | Admitting: Adult Health

## 2015-12-13 NOTE — Progress Notes (Signed)
Patient ID: [name], male   DOB: Jun 25, 1951, 65 y.o.   MRN: [id number]   Fac[facility]       No Known Allergies  Chief Complaint  Patient presents with  . Medical Management of Chronic Issues    Follow up    HPI:  He is a long term resident of this facility being seen for the management of his chronic illnesses. Overall he is doing well. He is not voicing any complaints or concerns stating that he is doing well. There are no nursing concerns at this time.    Past Medical History  Diagnosis Date  . Arthritis   . RCT (rotator cuff tear)   . Multiple abrasions     rt arm  . Morbid obesity ([place])   . Hypertension   . Hyperlipidemia   . COPD (chronic obstructive pulmonary disease) ([place])   . CHF (congestive heart failure) [facility])     Past Surgical History  Procedure Laterality Date  . No past surgeries    . Shoulder arthroscopy with subacromial decompression, rotator cuff repair and bicep tendon repair Right 12/28/2014    Procedure: RIGHT SHOULDER ARTHROSCOPY WITH SUBACROMIAL DECOMPRESSION, DISTAL CLAVICLE RESECTION, ROTATOR CUFF REPAIR ;  Surgeon: Sydnee Cabal, MD;  Location[facility];  Service: Orthopedics;  Laterality: Right;  . Tracheostomy tube placement N/A 01/16/2015    Procedure: TRACHEOSTOMY;  Surgeon: Jodi Marble, MD;  Location: [place];  Service: ENT;  Laterality: N/A;  . Tracheostomy revision N/A 01/18/2015    Procedure: TRACHEOSTOMY REVISION;  Surgeon: Ruby Cola, MD;  Location: [place];  Service: ENT;  Laterality: N/A;  . Tracheostomy tube placement N/A 01/22/2015    Procedure: TRACHEOSTOMY;  Surgeon: Jodi Marble, MD;  Location: [place];  Service: ENT;  Laterality: N/A;  . [id number]      VITAL SIGNS BP 107/76 mmHg  Pulse 76  Ht 6' (1.829 m)  Wt 239 lb (108.41 kg)  BMI 32.41 kg/m2  SpO2 97%  Patient's Medications  New Prescriptions   No medications on file  Previous Medications   ATORVASTATIN (LIPITOR) 20 MG TABLET    Take 20 mg by mouth daily.   CLONAZEPAM (KLONOPIN) 0.5 MG TABLET    Take 0.25 mg by mouth at bedtime.    DOCUSATE SODIUM (COLACE) 100 MG CAPSULE    Take 1 capsule (100 mg total) by mouth 2 (two) times daily.   DULOXETINE (CYMBALTA) 60 MG CAPSULE    Take 60 mg by mouth daily.   FAMOTIDINE (PEPCID) 20 MG TABLET    Take 20 mg by mouth daily.   FOLIC ACID (FOLVITE) 1 MG TABLET    Take 1 mg by mouth daily.   FUROSEMIDE (LASIX) 40 MG TABLET    Take 1 tablet (40 mg total) by mouth daily.   HYDRALAZINE (APRESOLINE) 25 MG TABLET    Take 37.5 mg by mouth 3 (three) times daily.    IPRATROPIUM-ALBUTEROL (DUONEB) 0.5-2.5 (3) MG/3ML SOLN    Take 3 mLs by nebulization every 6 (six) hours as needed. For shortness of breath   ISOSORBIDE MONONITRATE (ISMO,MONOKET) 20 MG TABLET    Take 20 mg by mouth 2 (two) times daily at 10 AM and 5 PM.   LIDOCAINE (LIDODERM) 5 %    Place 1 patch onto the skin daily. Remove & Discard patch within 12 hours or as directed by MD To right shoulder   METOCLOPRAMIDE (REGLAN) 10 MG TABLET    Take 10 mg by mouth 4 (four) times  daily -  before meals and at bedtime.   METOLAZONE (ZAROXOLYN) 5 MG TABLET    Take 5 mg by mouth daily.   OMEGA-3 FATTY ACIDS (FISH OIL) 1000 MG CAPS    Take 1,000 mg by mouth daily.    ONDANSETRON (ZOFRAN) 4 MG TABLET    Take 4 mg by mouth every 6 (six) hours.   POLYETHYLENE GLYCOL (MIRALAX / GLYCOLAX) PACKET    Take 17 g by mouth daily.   POTASSIUM CHLORIDE SA (K-DUR,KLOR-CON) 20 MEQ TABLET    Take 2 tablets (40 mEq total) by mouth 4 (four) times daily.   TRAMADOL (ULTRAM) 50 MG TABLET    Take 50 mg by mouth every 6 (six) hours as needed for moderate pain.   WARFARIN (COUMADIN) 6 MG TABLET    Take 6.5 mg by mouth daily at 6 PM.   Modified Medications   No medications on file  Discontinued Medications   No medications on file     SIGNIFICANT DIAGNOSTIC EXAMS  12-29-14: testicle ultrasound: Two solid extratesticular right scrotal masses. Primary differential diagnosis for  extratesticular masses include primary or metastatic neoplasms including adenomatoid tumor, fibroma, leiomyoma, mesothelioma, or sarcoma. Urology consultation is recommended. No sonographic evidence for testicular torsion or intratesticular mass.  01-01-15: 2-d echo: Left ventricle: The cavity size was normal. There was mild focal basal and mild concentric hypertrophy of the septum. Systolic function was normal. The estimated ejection fraction was in the range of 60% to 65%. Wall motion was normal; there were no regional wall motion abnormalities. Doppler parameters are consistent with abnormal left ventricular relaxation (grade 1 diastolic dysfunction). There was no evidence of elevate ventricular filling pressure by Doppler parameters. - Aortic valve: There was no regurgitation. - Aortic root: The aortic root was normal in size. - Mitral valve: Structurally normal valve. There was no regurgitation. - Right ventricle: Systolic function was normal. - Right atrium: The atrium was normal in size. - Tricuspid valve: There was trivial regurgitation. - Pulmonary arteries: Systolic pressure was within the normal range.   01-07-15: renal ultrasound: Limited examination demonstrating no acute abnormality. Negative for Hydronephrosis. Possible nonobstructing stone lower pole left kidney.  01-18-15: IVC filter placement: Successful IVC filter placement. This is temporary or can remain in place to become permanent.  01-28-15: ct of abdomen: 1. No significant colonic interposition between the anterior abdominal wall and stomach. 2. Hepatic steatosis. 3. Bilateral nephrolithiasis. Possible left UPJ stenosis without secondary signs of ureteral obstruction. 4. Cardiomegaly, bibasilar atelectasis and a small amount of retrosternal air, likely related to previous thoracic intervention.   01-28-15: ct of head: 1. No acute intracranial findings demonstrated. Mild atrophy and periventricular white matter disease. 2.  Bilateral mastoid effusions with opacification of the left middle ear.  06-17-15: chest x-ray; Cardiomegaly with mild pulmonary vascular congestion  06-17-15: ct of abdomen and pelvis: 1. Peripherally enhancing fluid collection or collections centered about the right-side of the prostate and pelvic cul-de-sac. Suspicious for abscess/ abscesses. 2. Air within the urinary bladder and left renal collecting system/ureter. Suspicious for ascending infection. Edema in the left hemipelvis, along the course of the left ureter, is likely secondary. 3. Fluid-filled large and small bowel loops are suspicious for adynamic ileus. No focal transition point identified. 4. Bilateral nephrolithiasis. 5.  Atherosclerosis, including within the coronary arteries. 6. Mild motion degradation.  06-19-15: ct guided prostate abscess drain: Fluid collection along the right side of the prostate compatible with an abscess. 30 mL of purulent fluid was removed.  06-24-15: ct of abdomen and pelvis: Fatty infiltration of the liver. Bilateral nonobstructive nephrolithiasis. Atherosclerosis of abdominal aorta without aneurysm formation. Sigmoid diverticulosis is noted without inflammation. Interval placement of transgluteal drainage catheter into right side of the pelvis. Fluid collection noted adjacent to prostate gland on prior exam has been completely decompressed.  06-25-15: kub: Moderate ileus pattern.    LABS REVIEWED:   12-31-14: hgb a1c 7.5; 01-23-15: hgb a1c 7.2  01-31-15: vit b12: 304 02-13-15: hgb a1c 6.3 03-14-15: wbc 8.5; hgb 12.;1 hct 36.3; mcv 93.1; plt 251; glucose 112; bun 20; creat 0.82; k+3.0; na++135; phos 3.7; mag 1.2; albumin 3.2 03-25-15: wbc 8.5; hgb 11.7; hct 36.4; mcv 95.0; plt 240; glucose 111; bun 20; creat 1.01; k+3.4; na++138  04-01-15: glucose 112; bun 19; creat 0.97; k+3.3; na++139 04-03-15: inr 1.63 coumadin 6 mg 04-09-15: inr 2.16 coumadin 7 mg 04-15-15: inr 2.16 coumadin 7 mg 04-12-15: glucose  101; bun 20; creat 0.99; k+3.3; na++ 140 06-17-15: wbc 12.6; hgb 13.6; hct 41.0; mcv 86.1; plt 282; glucose 137; bun 24; creat 0.90; k+ 3.3; na++137; alk phos  173; albumin 3.1; blood culture: no growth 06-18-15: hgb a1c 6.4 06-20-15: wbv 9.6; hgb 11.6; hct 36.7; mvc 87.4 plt 335 06-22-15: wbc 11.7; hgb 13.1; hct 40.3; mcv 85.4; plt 420; glucose 114; bun 19; creat 0.95; k+ 3.4; na++133 06-25-15: wbc 11.4; hgb 13.1; hct 41.1; mcv 84.7; plt 461; glucose 128; bun 36; creat 1.23; k+ 3.3; na++135; mag 2.1  06-28-15: wbc 11.;5 hgb 13.5; hct 39.6; mcv 82.2; plt 462; glucose 106; bun 40; creat 1.44; k+ 3.6; na++134 07-01-15: INR 2.19 07-29-15; INR 2.68  11-11-15: inr 2.26: coumadin 6 mg  12-02-15: INR 2.56 11-17-15: liver normal albumin 3.6; chol 154; ldl 92; trig 101; hdl 42      Review of Systems Constitutional: Negative for appetite change and fatigue.  HENT: Negative for congestion.   Respiratory: Negative for cough, chest tightness and shortness of breath.   Cardiovascular: Negative for chest pain, palpitations and leg swelling.  Gastrointestinal: Negative for nausea, abdominal pain, diarrhea and constipation.  Musculoskeletal: Negative for myalgias and arthralgias.  Skin: Negative for pallor.  Neurological: Negative for dizziness.  Psychiatric/Behavioral: The patient is not nervous/anxious.     Physical Exam Constitutional: He is oriented to person, place, and time. No distress. is obese  Eyes: Conjunctivae are normal.  Neck: Neck supple. No JVD present. No thyromegaly present.  Cardiovascular: Normal rate, regular rhythm and intact distal pulses.   Respiratory: Effort normal and breath sounds normal. No respiratory distress. He has no wheezes.  Has capped trach   GI: Soft. Bowel sounds are normal. He exhibits no distension. There is no tenderness.    Musculoskeletal: no lower extremity edema   Able to move all extremities  Bilateral lower extremities discolored from impaired  circulation   Lymphadenopathy:    He has no cervical adenopathy.  Neurological: He is alert and oriented to person, place, and time.  Skin: Skin is warm and dry. He is not diaphoretic.   Psychiatric: He has a normal mood and affect.     ASSESSMENT/ PLAN:  1. Chronic pain: his pain is presently being managed with lidoderm patch to right shoulder; will not make changes will monitor his status   2.  chronic respiratory failure; obesity hypoventilation syndrome:   is presenlty stable is trach dependent is presently not on medications;   3. Diastolic heart failure: will continue lasix 40 mg daily zaroxolyn 5 mg daily  4. Hypokalemia: will continue  k+  40 meq 4 times daily    5. Hypertension: will continue hydralazine 37.5 mg twice daily; isordil 20 mg twice daily   6. Jerrye Bushy: will continue reglan 10 mg four times daily and pepcid 20 mg daily take zofran 4 mg four times daily for nausea   7. Constipation: will continue miralax daily colace twice daily   8. Depression with anxiety: will continue cymbalta 60 mg  daily; take klonopin 0.5 mg nightly  for anxiety  9.  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

## 2015-12-23 LAB — POCT INR: INR: 2.7 — AB (ref 0.9–1.1)

## 2015-12-23 LAB — PROTIME-INR: PROTIME: 29.1 s — AB (ref 10.0–13.8)

## 2015-12-27 DIAGNOSIS — I110 Hypertensive heart disease with heart failure: Secondary | ICD-10-CM | POA: Insufficient documentation

## 2016-01-06 ENCOUNTER — Encounter: Payer: Self-pay | Admitting: Adult Health

## 2016-01-06 ENCOUNTER — Non-Acute Institutional Stay: Payer: BLUE CROSS/BLUE SHIELD | Admitting: Adult Health

## 2016-01-06 DIAGNOSIS — Z930 Tracheostomy status: Secondary | ICD-10-CM | POA: Diagnosis not present

## 2016-01-06 DIAGNOSIS — I5032 Chronic diastolic (congestive) heart failure: Secondary | ICD-10-CM | POA: Diagnosis not present

## 2016-01-06 DIAGNOSIS — I82501 Chronic embolism and thrombosis of unspecified deep veins of right lower extremity: Secondary | ICD-10-CM

## 2016-01-06 DIAGNOSIS — E785 Hyperlipidemia, unspecified: Secondary | ICD-10-CM | POA: Diagnosis not present

## 2016-01-06 DIAGNOSIS — I110 Hypertensive heart disease with heart failure: Secondary | ICD-10-CM

## 2016-01-06 DIAGNOSIS — F418 Other specified anxiety disorders: Secondary | ICD-10-CM | POA: Diagnosis not present

## 2016-01-06 DIAGNOSIS — J9611 Chronic respiratory failure with hypoxia: Secondary | ICD-10-CM

## 2016-01-06 DIAGNOSIS — Z95828 Presence of other vascular implants and grafts: Secondary | ICD-10-CM | POA: Diagnosis not present

## 2016-01-06 NOTE — Progress Notes (Signed)
Patient ID: [name], male   DOB: Dec 19, 1950, 65 y.o.   MRN: [id number]    Facility: [facility]     CODE STATUS: Full Code  No Known Allergies  Chief Complaint  Patient presents with  . Medical Management of Chronic Issues    Follow up    HPI:  He is a long term resident of this facility being seen for the management of his chronic illnesses. His status remains stable. He is not voicing any complaints or concerns. He states that he is feeling good. He has been seen by the [facility] clinic and the size of his trach has been reduced. There are no nursing concerns at this time.    Past Medical History  Diagnosis Date  . Arthritis   . RCT (rotator cuff tear)   . Multiple abrasions     rt arm  . Morbid obesity ([place])   . Hypertension   . Hyperlipidemia   . COPD (chronic obstructive pulmonary disease) ([place])   . CHF (congestive heart failure) [facility])     Past Surgical History  Procedure Laterality Date  . No past surgeries    . Shoulder arthroscopy with subacromial decompression, rotator cuff repair and bicep tendon repair Right 12/28/2014    Procedure: RIGHT SHOULDER ARTHROSCOPY WITH SUBACROMIAL DECOMPRESSION, DISTAL CLAVICLE RESECTION, ROTATOR CUFF REPAIR ;  Surgeon: Sydnee Cabal, MD;  Location: [facility];  Service: Orthopedics;  Laterality: Right;  . Tracheostomy tube placement N/A 01/16/2015    Procedure: TRACHEOSTOMY;  Surgeon: Jodi Marble, MD;  Location: [place];  Service: ENT;  Laterality: N/A;  . Tracheostomy revision N/A 01/18/2015    Procedure: TRACHEOSTOMY REVISION;  Surgeon: Ruby Cola, MD;  Location: [place];  Service: ENT;  Laterality: N/A;  . Tracheostomy tube placement N/A 01/22/2015    Procedure: TRACHEOSTOMY;  Surgeon: Jodi Marble, MD;  Location: [place];  Service: ENT;  Laterality: N/A;  . [id number]      Social History   Social History  . Marital Status: Single    Spouse Name: N/A  . Number of Children: N/A  . Years of Education: N/A   Occupational History    . Not on file.   Social History Main Topics  . Smoking status: Former Smoker -- 0.20 packs/day    Types: Cigarettes  . Smokeless tobacco: Not on file  . Alcohol Use: No  . Drug Use: No  . Sexual Activity: Not on file   Other Topics Concern  . Not on file   Social History Narrative   Family History  Problem Relation Age of Onset  . Heart disease Mother   . Heart attack Mother   . Heart disease Brother       VITAL SIGNS BP 118/74 mmHg  Pulse 74  Temp(Src) 97.6 F (36.4 C) (Oral)  Resp 18  Ht 6' (1.829 m)  Wt 237 lb (107.502 kg)  BMI 32.14 kg/m2  SpO2 96%  Patient's Medications  New Prescriptions   No medications on file  Previous Medications   ATORVASTATIN (LIPITOR) 20 MG TABLET    Take 20 mg by mouth daily.   CLONAZEPAM (KLONOPIN) 0.5 MG TABLET    Take 0.25 mg by mouth at bedtime.    DOCUSATE SODIUM (COLACE) 100 MG CAPSULE    Take 1 capsule (100 mg total) by mouth 2 (two) times daily.   DULOXETINE (CYMBALTA) 60 MG CAPSULE    Take 60 mg by mouth daily.   FAMOTIDINE (PEPCID) 20 MG TABLET  Take 20 mg by mouth daily.   FOLIC ACID (FOLVITE) 1 MG TABLET    Take 1 mg by mouth daily.   FUROSEMIDE (LASIX) 40 MG TABLET    Take 1 tablet (40 mg total) by mouth daily.   HYDRALAZINE (APRESOLINE) 25 MG TABLET    Take 37.5 mg by mouth 3 (three) times daily.    IPRATROPIUM-ALBUTEROL (DUONEB) 0.5-2.5 (3) MG/3ML SOLN    Take 3 mLs by nebulization every 6 (six) hours as needed. For shortness of breath   ISOSORBIDE MONONITRATE (ISMO,MONOKET) 20 MG TABLET    Take 20 mg by mouth 2 (two) times daily at 10 AM and 5 PM.   LIDOCAINE (LIDODERM) 5 %    Place 1 patch onto the skin daily. Remove & Discard patch within 12 hours or as directed by MD To right shoulder   METOCLOPRAMIDE (REGLAN) 10 MG TABLET    Take 10 mg by mouth 4 (four) times daily -  before meals and at bedtime.   METOLAZONE (ZAROXOLYN) 5 MG TABLET    Take 5 mg by mouth daily.   OMEGA-3 FATTY ACIDS (FISH OIL) 1000 MG CAPS     Take 1,000 mg by mouth daily.    ONDANSETRON (ZOFRAN) 4 MG TABLET    Take 4 mg by mouth every 6 (six) hours.   POLYETHYLENE GLYCOL (MIRALAX / GLYCOLAX) PACKET    Take 17 g by mouth daily.   POTASSIUM CHLORIDE SA (K-DUR,KLOR-CON) 20 MEQ TABLET    Take 2 tablets (40 mEq total) by mouth 4 (four) times daily.   TRAMADOL (ULTRAM) 50 MG TABLET    Take 50 mg by mouth every 6 (six) hours as needed for moderate pain.   WARFARIN (COUMADIN) 6 MG TABLET    Take 6.5 mg by mouth daily at 6 PM.   Modified Medications   No medications on file  Discontinued Medications   No medications on file     SIGNIFICANT DIAGNOSTIC EXAMS  12-29-14: testicle ultrasound: Two solid extratesticular right scrotal masses. Primary differential diagnosis for extratesticular masses include primary or metastatic neoplasms including adenomatoid tumor, fibroma, leiomyoma, mesothelioma, or sarcoma. Urology consultation is recommended. No sonographic evidence for testicular torsion or intratesticular mass.  01-01-15: 2-d echo: Left ventricle: The cavity size was normal. There was mild focal basal and mild concentric hypertrophy of the septum. Systolic function was normal. The estimated ejection fraction was in the range of 60% to 65%. Wall motion was normal; there were no regional wall motion abnormalities. Doppler parameters are consistent with abnormal left ventricular relaxation (grade 1 diastolic dysfunction). There was no evidence of elevate ventricular filling pressure by Doppler parameters. - Aortic valve: There was no regurgitation. - Aortic root: The aortic root was normal in size. - Mitral valve: Structurally normal valve. There was no regurgitation. - Right ventricle: Systolic function was normal. - Right atrium: The atrium was normal in size. - Tricuspid valve: There was trivial regurgitation. - Pulmonary arteries: Systolic pressure was within the normal range.   01-07-15: renal ultrasound: Limited examination  demonstrating no acute abnormality. Negative for Hydronephrosis. Possible nonobstructing stone lower pole left kidney.  01-18-15: IVC filter placement: Successful IVC filter placement. This is temporary or can remain in place to become permanent.  01-28-15: ct of abdomen: 1. No significant colonic interposition between the anterior abdominal wall and stomach. 2. Hepatic steatosis. 3. Bilateral nephrolithiasis. Possible left UPJ stenosis without secondary signs of ureteral obstruction. 4. Cardiomegaly, bibasilar atelectasis and a small amount of retrosternal air,  likely related to previous thoracic intervention.   01-28-15: ct of head: 1. No acute intracranial findings demonstrated. Mild atrophy and periventricular white matter disease. 2. Bilateral mastoid effusions with opacification of the left middle ear.  06-17-15: chest x-ray; Cardiomegaly with mild pulmonary vascular congestion  06-17-15: ct of abdomen and pelvis: 1. Peripherally enhancing fluid collection or collections centered about the right-side of the prostate and pelvic cul-de-sac. Suspicious for abscess/ abscesses. 2. Air within the urinary bladder and left renal collecting system/ureter. Suspicious for ascending infection. Edema in the left hemipelvis, along the course of the left ureter, is likely secondary. 3. Fluid-filled large and small bowel loops are suspicious for adynamic ileus. No focal transition point identified. 4. Bilateral nephrolithiasis. 5.  Atherosclerosis, including within the coronary arteries. 6. Mild motion degradation.  06-19-15: ct guided prostate abscess drain: Fluid collection along the right side of the prostate compatible with an abscess. 30 mL of purulent fluid was removed.  06-24-15: ct of abdomen and pelvis: Fatty infiltration of the liver. Bilateral nonobstructive nephrolithiasis. Atherosclerosis of abdominal aorta without aneurysm formation. Sigmoid diverticulosis is noted without  inflammation. Interval placement of transgluteal drainage catheter into right side of the pelvis. Fluid collection noted adjacent to prostate gland on prior exam has been completely decompressed.  06-25-15: kub: Moderate ileus pattern.    LABS REVIEWED:   01-23-15: hgb a1c 7.2  01-31-15: vit b12: 304 02-13-15: hgb a1c 6.3 03-14-15: wbc 8.5; hgb 12.;1 hct 36.3; mcv 93.1; plt 251; glucose 112; bun 20; creat 0.82; k+3.0; na++135; phos 3.7; mag 1.2; albumin 3.2 03-25-15: wbc 8.5; hgb 11.7; hct 36.4; mcv 95.0; plt 240; glucose 111; bun 20; creat 1.01; k+3.4; na++138  04-01-15: glucose 112; bun 19; creat 0.97; k+3.3; na++139 04-03-15: inr 1.63 coumadin 6 mg 04-09-15: inr 2.16 coumadin 7 mg 04-15-15: inr 2.16 coumadin 7 mg 04-12-15: glucose 101; bun 20; creat 0.99; k+3.3; na++ 140 06-17-15: wbc 12.6; hgb 13.6; hct 41.0; mcv 86.1; plt 282; glucose 137; bun 24; creat 0.90; k+ 3.3; na++137; alk phos  173; albumin 3.1; blood culture: no growth 06-18-15: hgb a1c 6.4 06-20-15: wbv 9.6; hgb 11.6; hct 36.7; mvc 87.4 plt 335 06-22-15: wbc 11.7; hgb 13.1; hct 40.3; mcv 85.4; plt 420; glucose 114; bun 19; creat 0.95; k+ 3.4; na++133 06-25-15: wbc 11.4; hgb 13.1; hct 41.1; mcv 84.7; plt 461; glucose 128; bun 36; creat 1.23; k+ 3.3; na++135; mag 2.1  06-28-15: wbc 11.;5 hgb 13.5; hct 39.6; mcv 82.2; plt 462; glucose 106; bun 40; creat 1.44; k+ 3.6; na++134 07-01-15: INR 2.19 07-29-15; INR 2.68  11-11-15: inr 2.26: coumadin 6 mg  12-02-15: INR 2.56 11-17-15: liver normal albumin 3.6; chol 154; ldl 92; trig 101; hdl 42      Review of Systems Constitutional: Negative for appetite change and fatigue.  HENT: Negative for congestion.   Respiratory: Negative for cough, chest tightness and shortness of breath.   Cardiovascular: Negative for chest pain, palpitations and leg swelling.  Gastrointestinal: Negative for nausea, abdominal pain, diarrhea and constipation.  Musculoskeletal: Negative for myalgias and arthralgias.    Skin: Negative for pallor.  Neurological: Negative for dizziness.  Psychiatric/Behavioral: The patient is not nervous/anxious.     Physical Exam Constitutional: He is oriented to person, place, and time. No distress. is obese  Eyes: Conjunctivae are normal.  Neck: Neck supple. No JVD present. No thyromegaly present.  Cardiovascular: Normal rate, regular rhythm and intact distal pulses.   Respiratory: Effort normal and breath sounds normal. No respiratory distress. He  has no wheezes.  Has capped trach   GI: Soft. Bowel sounds are normal. He exhibits no distension. There is no tenderness.    Musculoskeletal: no lower extremity edema   Able to move all extremities  Bilateral lower extremities discolored from impaired circulation   Lymphadenopathy:    He has no cervical adenopathy.  Neurological: He is alert and oriented to person, place, and time.  Skin: Skin is warm and dry. He is not diaphoretic.   Psychiatric: He has a normal mood and affect.     ASSESSMENT/ PLAN:  1. Chronic pain: his pain is presently being managed with lidoderm patch to right shoulder; will not make changes will monitor his status   2.  chronic respiratory failure; obesity hypoventilation syndrome:   is presenlty stable is trach dependent is presently not on medications;   3. Diastolic heart failure: will continue lasix 40 mg daily zaroxolyn 5 mg daily   4. Hypokalemia: will continue  k+  40 meq 4 times daily    5. Hypertension: will continue hydralazine 37.5 mg twice daily; isordil 20 mg twice daily   6. Jerrye Bushy: will continue reglan 10 mg four times daily and pepcid 20 mg daily take zofran 4 mg four times daily for nausea   7. Constipation: will continue miralax daily colace twice daily   8. Depression with anxiety: will continue cymbalta 60 mg  daily; take klonopin 0.5 mg nightly  for anxiety  9.  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

## 2016-01-16 ENCOUNTER — Emergency Department (HOSPITAL_COMMUNITY)
Admission: EM | Admit: 2016-01-16 | Discharge: 2016-01-16 | Disposition: A | Discharge status: Home / Self Care | Payer: No Typology Code available for payment source | Attending: Emergency Medicine | Admitting: Emergency Medicine

## 2016-01-16 ENCOUNTER — Encounter (HOSPITAL_COMMUNITY): Payer: Self-pay | Admitting: Emergency Medicine

## 2016-01-16 DIAGNOSIS — Z87891 Personal history of nicotine dependence: Secondary | ICD-10-CM | POA: Insufficient documentation

## 2016-01-16 DIAGNOSIS — I509 Heart failure, unspecified: Secondary | ICD-10-CM | POA: Insufficient documentation

## 2016-01-16 DIAGNOSIS — E785 Hyperlipidemia, unspecified: Secondary | ICD-10-CM | POA: Insufficient documentation

## 2016-01-16 DIAGNOSIS — Z430 Encounter for attention to tracheostomy: Secondary | ICD-10-CM | POA: Insufficient documentation

## 2016-01-16 DIAGNOSIS — J449 Chronic obstructive pulmonary disease, unspecified: Secondary | ICD-10-CM | POA: Insufficient documentation

## 2016-01-16 DIAGNOSIS — I10 Essential (primary) hypertension: Secondary | ICD-10-CM | POA: Insufficient documentation

## 2016-01-16 DIAGNOSIS — Z8739 Personal history of other diseases of the musculoskeletal system and connective tissue: Secondary | ICD-10-CM | POA: Insufficient documentation

## 2016-01-16 DIAGNOSIS — Z79899 Other long term (current) drug therapy: Secondary | ICD-10-CM | POA: Insufficient documentation

## 2016-01-16 DIAGNOSIS — Z7901 Long term (current) use of anticoagulants: Secondary | ICD-10-CM | POA: Insufficient documentation

## 2016-01-16 DIAGNOSIS — Z87828 Personal history of other (healed) physical injury and trauma: Secondary | ICD-10-CM | POA: Insufficient documentation

## 2016-01-16 NOTE — ED Notes (Signed)
[facility] contacted for transportation.  

## 2016-01-16 NOTE — ED Notes (Signed)
[facility].  [name] has become dislodged.  Pt started at a size 8 then went to 6 then a 4 about 2 months ago.  Staff tried to clean and replace it, unable to get it seated so it continues to come dislarged.  Pt states he never uses O2 and has never had an inner cannula.  97% RA, P76, BP 130/72  A&O x 3 which is baseline per facilty.

## 2016-01-16 NOTE — ED Notes (Signed)
Bed: WA07 Expected date:  Expected time:  Means of arrival:  Comments: Ems-trache dislodged

## 2016-01-16 NOTE — ED Provider Notes (Signed)
CSN: [id number]     Arrival date & time 01/16/16  1757 History   First MD Initiated Contact with Patient 01/16/16 1823     Chief Complaint  Patient presents with  . Tracheostomy Tube Change     (Consider location/radiation/quality/duration/timing/severity/associated sxs/prior Treatment) HPI   [name] is a 65 y.o. male, with a history of CHF, COPD, and tracheostomy, presenting to the ED  For tracheostomy tube change. Patient has a size 4 in place. Patient states that his [name] tube became unseated from the collar and keeps trying to slip out. Patient denies shortness of breath, pain, fever/chills, vomiting, or any other complaints.    Past Medical History  Diagnosis Date  . Arthritis   . RCT (rotator cuff tear)   . Multiple abrasions     rt arm  . Morbid obesity ([facility])   . Hypertension   . Hyperlipidemia   . COPD (chronic obstructive pulmonary disease) ([facility])   . CHF (congestive heart failure) [facility])    Past Surgical History  Procedure Laterality Date  . No past surgeries    . Shoulder arthroscopy with subacromial decompression, rotator cuff repair and bicep tendon repair Right 12/28/2014    Procedure: RIGHT SHOULDER ARTHROSCOPY WITH SUBACROMIAL DECOMPRESSION, DISTAL CLAVICLE RESECTION, ROTATOR CUFF REPAIR ;  Surgeon: Eugenia Mcalpine, MD;  Location: [facility];  Service: Orthopedics;  Laterality: Right;  . Tracheostomy tube placement N/A 01/16/2015    Procedure: TRACHEOSTOMY;  Surgeon: Flo Shanks, MD;  Location: [facility];  Service: ENT;  Laterality: N/A;  . Tracheostomy revision N/A 01/18/2015    Procedure: TRACHEOSTOMY REVISION;  Surgeon: Melvenia Beam, MD;  Location: [facility];  Service: ENT;  Laterality: N/A;  . Tracheostomy tube placement N/A 01/22/2015    Procedure: TRACHEOSTOMY;  Surgeon: Flo Shanks, MD;  Location: [facility] OR;  Service: ENT;  Laterality: N/A;  . [id number]     Family History  Problem Relation Age of Onset  . Heart disease Mother   . Heart attack Mother   . Heart  disease Brother    Social History  Substance Use Topics  . Smoking status: Former Smoker -- 0.20 packs/day    Types: Cigarettes  . Smokeless tobacco: None  . Alcohol Use: No    Review of Systems  Constitutional: Negative for fever and chills.  Respiratory: Negative for shortness of breath.        Tracheostomy tube change  Skin: Negative for color change and pallor.      Allergies  Review of patient's allergies indicates no known allergies.  Home Medications   Prior to Admission medications   Medication Sig Start Date End Date Taking? Authorizing Provider  atorvastatin (LIPITOR) 20 MG tablet Take 20 mg by mouth daily.    Historical Provider, MD  clonazePAM (KLONOPIN) 0.5 MG tablet Take 0.25 mg by mouth at bedtime.     Historical Provider, MD  docusate sodium (COLACE) 100 MG capsule Take 1 capsule (100 mg total) by mouth 2 (two) times daily. 06/26/15   Dorothea Ogle, MD  DULoxetine (CYMBALTA) 60 MG capsule Take 60 mg by mouth daily.    Historical Provider, MD  famotidine (PEPCID) 20 MG tablet Take 20 mg by mouth daily.    Historical Provider, MD  folic acid (FOLVITE) 1 MG tablet Take 1 mg by mouth daily.    Historical Provider, MD  furosemide (LASIX) 40 MG tablet Take 1 tablet (40 mg total) by mouth daily. 06/13/15   Ruben Im, MD  hydrALAZINE (APRESOLINE) 25 MG  tablet Take 37.5 mg by mouth 3 (three) times daily.     Historical Provider, MD  ipratropium-albuterol (DUONEB) 0.5-2.5 (3) MG/3ML SOLN Take 3 mLs by nebulization every 6 (six) hours as needed. For shortness of breath    Historical Provider, MD  isosorbide mononitrate (ISMO,MONOKET) 20 MG tablet Take 20 mg by mouth 2 (two) times daily at 10 AM and 5 PM.    Historical Provider, MD  lidocaine (LIDODERM) 5 % Place 1 patch onto the skin daily. Remove & Discard patch within 12 hours or as directed by MD To right shoulder    Historical Provider, MD  metoCLOPramide (REGLAN) 10 MG tablet Take 10 mg by mouth 4 (four) times daily -   before meals and at bedtime.    Historical Provider, MD  metolazone (ZAROXOLYN) 5 MG tablet Take 5 mg by mouth daily.    Historical Provider, MD  Omega-3 Fatty Acids (FISH OIL) 1000 MG CAPS Take 1,000 mg by mouth daily.     Historical Provider, MD  ondansetron (ZOFRAN) 4 MG tablet Take 4 mg by mouth every 6 (six) hours.    Historical Provider, MD  polyethylene glycol (MIRALAX / GLYCOLAX) packet Take 17 g by mouth daily. 06/26/15   Dorothea OgleIskra M Myers, MD  potassium chloride SA (K-DUR,KLOR-CON) 20 MEQ tablet Take 2 tablets (40 mEq total) by mouth 4 (four) times daily. 04/22/15   Sharee Holstereborah S Green, NP  traMADol (ULTRAM) 50 MG tablet Take 50 mg by mouth every 6 (six) hours as needed for moderate pain.    Historical Provider, MD  warfarin (COUMADIN) 6 MG tablet Take 6.5 mg by mouth daily at 6 PM.     Historical Provider, MD   BP 117/84 mmHg  Pulse 85  Temp(Src) 97.7 F (36.5 C) (Oral)  Resp 16  SpO2 96% Physical Exam  Constitutional: He appears well-developed and well-nourished. No distress.  HENT:  Head: Normocephalic and atraumatic.  Eyes: Conjunctivae are normal.  Neck:  4.0 tracheostomy tube in place. Detached from collar. When not held in place, tube begins to dislodge.  Cardiovascular: Normal rate and regular rhythm.   Pulmonary/Chest: Effort normal.  Lymphadenopathy:    He has no cervical adenopathy.  Neurological: He is alert.  Skin: Skin is warm and dry. He is not diaphoretic.  Nursing note and vitals reviewed.   ED Course  TRACHEOSTOMY REPLACEMENT Date/Time: 01/16/2016 7:14 PM Performed by: Anselm PancoastJOY, SHAWN C Authorized by: Harolyn RutherfordJOY, SHAWN C Consent: Verbal consent obtained. Risks and benefits: risks, benefits and alternatives were discussed Consent given by: patient Patient understanding: patient states understanding of the procedure being performed Patient consent: the patient's understanding of the procedure matches consent given Procedure consent: procedure consent matches procedure  scheduled Patient identity confirmed: verbally with patient and arm band Indications: became dislodged Local anesthesia used: no Patient sedated: no Tube type: single cannula Tube cuff: cuffless Tube size: 4.0 mm Seldinger technique: Seldinger technique used Patient tolerance: Patient tolerated the procedure well with no immediate complications   (including critical care time)   MDM   Final diagnoses:  Attention to tracheostomy tube [facility](HCC)    [name] presents for tracheostomy tube replacement.  Patient has no complaints and is in no distress. Tracheostomy tube replaced without incident. No suctioning required. Good color change on capnometer. Tracheostomy tube care and return precautions discussed. Patient voiced understanding of these instructions and is comfortable with discharge.    Anselm PancoastShawn C Joy, PA-C 01/16/16 1917  Gerhard Munchobert Lockwood, MD 01/16/16 2237

## 2016-01-16 NOTE — Discharge Instructions (Signed)
You have been seen today for tracheostomy tube exchange. Follow up with PCP as needed. Return to ED should symptoms worsen.

## 2016-01-16 NOTE — ED Notes (Signed)
Pt assisted with toileting 

## 2016-01-16 NOTE — Progress Notes (Signed)
RT assisted MD with #4 CFS trach exchange- uneventful. Existing trach upon arrival was broken with no inner cannula. Sp02 97% on RA (PT refuses ATC), BS clear diminished, positive end tidal. PT states he is breathing fine at this time.

## 2016-01-27 LAB — POCT INR: INR: 2.5 — AB (ref 0.9–1.1)

## 2016-01-27 LAB — PROTIME-INR: Protime: 27.4 seconds — AB (ref 10.0–13.8)

## 2016-02-06 ENCOUNTER — Non-Acute Institutional Stay: Payer: BLUE CROSS/BLUE SHIELD | Admitting: Adult Health

## 2016-02-06 ENCOUNTER — Encounter: Payer: Self-pay | Admitting: Adult Health

## 2016-02-06 DIAGNOSIS — E785 Hyperlipidemia, unspecified: Secondary | ICD-10-CM

## 2016-02-06 DIAGNOSIS — Z930 Tracheostomy status: Secondary | ICD-10-CM | POA: Diagnosis not present

## 2016-02-06 DIAGNOSIS — E876 Hypokalemia: Secondary | ICD-10-CM

## 2016-02-06 DIAGNOSIS — J9611 Chronic respiratory failure with hypoxia: Secondary | ICD-10-CM

## 2016-02-06 DIAGNOSIS — Z95828 Presence of other vascular implants and grafts: Secondary | ICD-10-CM | POA: Diagnosis not present

## 2016-02-06 DIAGNOSIS — E662 Morbid (severe) obesity with alveolar hypoventilation: Secondary | ICD-10-CM | POA: Diagnosis not present

## 2016-02-06 DIAGNOSIS — I5032 Chronic diastolic (congestive) heart failure: Secondary | ICD-10-CM

## 2016-02-06 DIAGNOSIS — I82501 Chronic embolism and thrombosis of unspecified deep veins of right lower extremity: Secondary | ICD-10-CM

## 2016-02-06 DIAGNOSIS — I110 Hypertensive heart disease with heart failure: Secondary | ICD-10-CM | POA: Diagnosis not present

## 2016-02-06 NOTE — Progress Notes (Signed)
Patient ID: [name], male   DOB: November 11, 1950, 65 y.o.   MRN: [id number]   Location:  [place] Room Number: 141-A Place of Service:  SNF (31)   CODE STATUS: Full Code  No Known Allergies  Chief Complaint  Patient presents with  . Medical Management of Chronic Issues    Follow up    HPI:  He is a long term resident of this facility being seen for the management of his chronic illnesses. He tells me that he is feeling good and has no concerns. There are no nursing concerns at this time. Overall his status is without significant change.   Past Medical History  Diagnosis Date  . Arthritis   . RCT (rotator cuff tear)   . Multiple abrasions     rt arm  . Morbid obesity ([place])   . Hypertension   . Hyperlipidemia   . COPD (chronic obstructive pulmonary disease) ([place])   . CHF (congestive heart failure) [facility])     Past Surgical History  Procedure Laterality Date  . No past surgeries    . Shoulder arthroscopy with subacromial decompression, rotator cuff repair and bicep tendon repair Right 12/28/2014    Procedure: RIGHT SHOULDER ARTHROSCOPY WITH SUBACROMIAL DECOMPRESSION, DISTAL CLAVICLE RESECTION, ROTATOR CUFF REPAIR ;  Surgeon: Sydnee Cabal, MD;  Location: [facility];  Service: Orthopedics;  Laterality: Right;  . Tracheostomy tube placement N/A 01/16/2015    Procedure: TRACHEOSTOMY;  Surgeon: Jodi Marble, MD;  Location: [place];  Service: ENT;  Laterality: N/A;  . Tracheostomy revision N/A 01/18/2015    Procedure: TRACHEOSTOMY REVISION;  Surgeon: Ruby Cola, MD;  Location: [place];  Service: ENT;  Laterality: N/A;  . Tracheostomy tube placement N/A 01/22/2015    Procedure: TRACHEOSTOMY;  Surgeon: Jodi Marble, MD;  Location: [place];  Service: ENT;  Laterality: N/A;  . [id number]      Social History   Social History  . Marital Status: Single    Spouse Name: N/A  . Number of Children: N/A  . Years of Education: N/A   Occupational History  . Not  on file.   Social History Main Topics  . Smoking status: Former Smoker -- 0.20 packs/day    Types: Cigarettes  . Smokeless tobacco: Not on file  . Alcohol Use: No  . Drug Use: No  . Sexual Activity: Not on file   Other Topics Concern  . Not on file   Social History Narrative   Family History  Problem Relation Age of Onset  . Heart disease Mother   . Heart attack Mother   . Heart disease Brother       VITAL SIGNS BP 148/60 mmHg  Pulse 80  Temp(Src) 98.9 F (37.2 C) (Oral)  Resp 18  Ht 6' (1.829 m)  Wt 230 lb (104.327 kg)  BMI 31.19 kg/m2  SpO2 96%  Patient's Medications  New Prescriptions   No medications on file  Previous Medications   ATORVASTATIN (LIPITOR) 20 MG TABLET    Take 20 mg by mouth daily.   CLONAZEPAM (KLONOPIN) 0.5 MG TABLET    Take 0.25 mg by mouth at bedtime.    DOCUSATE SODIUM (COLACE) 100 MG CAPSULE    Take 1 capsule (100 mg total) by mouth 2 (two) times daily.   DULOXETINE (CYMBALTA) 60 MG CAPSULE    Take 60 mg by mouth daily.   FAMOTIDINE (PEPCID) 20 MG TABLET    Take 20 mg by mouth daily.  FOLIC ACID (FOLVITE) 1 MG TABLET    Take 1 mg by mouth daily.   FUROSEMIDE (LASIX) 40 MG TABLET    Take 1 tablet (40 mg total) by mouth daily.   HYDRALAZINE (APRESOLINE) 25 MG TABLET    Take 37.5 mg by mouth 3 (three) times daily.    IPRATROPIUM-ALBUTEROL (DUONEB) 0.5-2.5 (3) MG/3ML SOLN    Take 3 mLs by nebulization every 6 (six) hours as needed. For shortness of breath   ISOSORBIDE MONONITRATE (ISMO,MONOKET) 20 MG TABLET    Take 20 mg by mouth 2 (two) times daily at 10 AM and 5 PM.   LIDOCAINE (LIDODERM) 5 %    Place 1 patch onto the skin daily. Remove & Discard patch within 12 hours or as directed by MD To right shoulder   METOCLOPRAMIDE (REGLAN) 10 MG TABLET    Take 10 mg by mouth 4 (four) times daily -  before meals and at bedtime.   METOLAZONE (ZAROXOLYN) 5 MG TABLET    Take 5 mg by mouth daily.   OMEGA-3 FATTY ACIDS (FISH OIL) 1000 MG CAPS    Take  1,000 mg by mouth daily.    ONDANSETRON (ZOFRAN) 4 MG TABLET    Take 4 mg by mouth every 6 (six) hours.   POLYETHYLENE GLYCOL (MIRALAX / GLYCOLAX) PACKET    Take 17 g by mouth daily.   POTASSIUM CHLORIDE SA (K-DUR,KLOR-CON) 20 MEQ TABLET    Take 2 tablets (40 mEq total) by mouth 4 (four) times daily.   WARFARIN (COUMADIN) 6 MG TABLET    Take 6.5 mg by mouth daily at 6 PM.   Modified Medications   No medications on file  Discontinued Medications   TRAMADOL (ULTRAM) 50 MG TABLET    Take 50 mg by mouth every 6 (six) hours as needed for moderate pain. Reported on 02/06/2016     SIGNIFICANT DIAGNOSTIC EXAMS  12-29-14: testicle ultrasound: Two solid extratesticular right scrotal masses. Primary differential diagnosis for extratesticular masses include primary or metastatic neoplasms including adenomatoid tumor, fibroma, leiomyoma, mesothelioma, or sarcoma. Urology consultation is recommended. No sonographic evidence for testicular torsion or intratesticular mass.  01-01-15: 2-d echo: Left ventricle: The cavity size was normal. There was mild focal basal and mild concentric hypertrophy of the septum. Systolic function was normal. The estimated ejection fraction was in the range of 60% to 65%. Wall motion was normal; there were no regional wall motion abnormalities. Doppler parameters are consistent with abnormal left ventricular relaxation (grade 1 diastolic dysfunction). There was no evidence of elevate ventricular filling pressure by Doppler parameters. - Aortic valve: There was no regurgitation. - Aortic root: The aortic root was normal in size. - Mitral valve: Structurally normal valve. There was no regurgitation. - Right ventricle: Systolic function was normal. - Right atrium: The atrium was normal in size. - Tricuspid valve: There was trivial regurgitation. - Pulmonary arteries: Systolic pressure was within the normal range.   01-07-15: renal ultrasound: Limited examination demonstrating no  acute abnormality. Negative for Hydronephrosis. Possible nonobstructing stone lower pole left kidney.  01-18-15: IVC filter placement: Successful IVC filter placement. This is temporary or can remain in place to become permanent.  01-28-15: ct of abdomen: 1. No significant colonic interposition between the anterior abdominal wall and stomach. 2. Hepatic steatosis. 3. Bilateral nephrolithiasis. Possible left UPJ stenosis without secondary signs of ureteral obstruction. 4. Cardiomegaly, bibasilar atelectasis and a small amount of retrosternal air, likely related to previous thoracic intervention.   01-28-15: ct of  head: 1. No acute intracranial findings demonstrated. Mild atrophy and periventricular white matter disease. 2. Bilateral mastoid effusions with opacification of the left middle ear.  06-17-15: chest x-ray; Cardiomegaly with mild pulmonary vascular congestion  06-19-15: ct guided prostate abscess drain: Fluid collection along the right side of the prostate compatible with an abscess. 30 mL of purulent fluid was removed.  06-24-15: ct of abdomen and pelvis: Fatty infiltration of the liver. Bilateral nonobstructive nephrolithiasis. Atherosclerosis of abdominal aorta without aneurysm formation. Sigmoid diverticulosis is noted without inflammation. Interval placement of transgluteal drainage catheter into right side of the pelvis. Fluid collection noted adjacent to prostate gland on prior exam has been completely decompressed.  06-25-15: kub: Moderate ileus pattern.    LABS REVIEWED:   02-13-15: hgb a1c 6.3 03-14-15: wbc 8.5; hgb 12.;1 hct 36.3; mcv 93.1; plt 251; glucose 112; bun 20; creat 0.82; k+3.0; na++135; phos 3.7; mag 1.2; albumin 3.2 03-25-15: wbc 8.5; hgb 11.7; hct 36.4; mcv 95.0; plt 240; glucose 111; bun 20; creat 1.01; k+3.4; na++138  04-01-15: glucose 112; bun 19; creat 0.97; k+3.3; na++139 04-03-15: inr 1.63 coumadin 6 mg 04-09-15: inr 2.16 coumadin 7 mg 04-15-15: inr 2.16  coumadin 7 mg 04-12-15: glucose 101; bun 20; creat 0.99; k+3.3; na++ 140 06-17-15: wbc 12.6; hgb 13.6; hct 41.0; mcv 86.1; plt 282; glucose 137; bun 24; creat 0.90; k+ 3.3; na++137; alk phos  173; albumin 3.1; blood culture: no growth 06-18-15: hgb a1c 6.4 06-20-15: wbv 9.6; hgb 11.6; hct 36.7; mvc 87.4 plt 335 06-22-15: wbc 11.7; hgb 13.1; hct 40.3; mcv 85.4; plt 420; glucose 114; bun 19; creat 0.95; k+ 3.4; na++133 06-25-15: wbc 11.4; hgb 13.1; hct 41.1; mcv 84.7; plt 461; glucose 128; bun 36; creat 1.23; k+ 3.3; na++135; mag 2.1  06-28-15: wbc 11.;5 hgb 13.5; hct 39.6; mcv 82.2; plt 462; glucose 106; bun 40; creat 1.44; k+ 3.6; na++134 07-01-15: INR 2.19 07-29-15; INR 2.68  11-11-15: inr 2.26: coumadin 6 mg  12-02-15: INR 2.56 11-17-15: liver normal albumin 3.6; chol 154; ldl 92; trig 101; hdl 42 01-27-16: INR 2.5       Review of Systems Constitutional: Negative for appetite change and fatigue.  HENT: Negative for congestion.   Respiratory: Negative for cough, chest tightness and shortness of breath.   Cardiovascular: Negative for chest pain, palpitations and leg swelling.  Gastrointestinal: Negative for nausea, abdominal pain, diarrhea and constipation.  Musculoskeletal: Negative for myalgias and arthralgias.  Skin: Negative for pallor.  Neurological: Negative for dizziness.  Psychiatric/Behavioral: The patient is not nervous/anxious.     Physical Exam Constitutional: He is oriented to person, place, and time. No distress. is obese  Eyes: Conjunctivae are normal.  Neck: Neck supple. No JVD present. No thyromegaly present.  Cardiovascular: Normal rate, regular rhythm and intact distal pulses.   Respiratory: Effort normal and breath sounds normal. No respiratory distress. He has no wheezes.  Has capped trach   GI: Soft. Bowel sounds are normal. He exhibits no distension. There is no tenderness.    Musculoskeletal: no lower extremity edema   Able to move all extremities  Bilateral  lower extremities discolored from impaired circulation   Lymphadenopathy:    He has no cervical adenopathy.  Neurological: He is alert and oriented to person, place, and time.  Skin: Skin is warm and dry. He is not diaphoretic.   Psychiatric: He has a normal mood and affect.     ASSESSMENT/ PLAN:  1. Chronic pain: his pain is presently being managed with  lidoderm patch to right shoulder; will not make changes will monitor his status   2.  chronic respiratory failure; obesity hypoventilation syndrome:   is presenlty stable is trach for structure support. His trach is slowly begin reduced. Will monitor    3. Diastolic heart failure: will continue lasix 40 mg daily zaroxolyn 5 mg daily   4. Hypokalemia: will continue  k+  40 meq 4 times daily    5. Hypertension: will continue hydralazine 37.5 mg twice daily; isordil 20 mg twice daily   6. Jerrye Bushy: will continue reglan 10 mg four times daily and pepcid 20 mg daily take zofran 4 mg four times daily for nausea   7. Constipation: will continue miralax daily colace twice daily   8. Depression with anxiety: will continue cymbalta 60 mg  daily; take klonopin 0.5 mg nightly  for anxiety  9.  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

## 2016-02-10 ENCOUNTER — Encounter: Payer: Self-pay | Admitting: Adult Health

## 2016-02-10 ENCOUNTER — Non-Acute Institutional Stay: Payer: BLUE CROSS/BLUE SHIELD | Admitting: Adult Health

## 2016-02-10 DIAGNOSIS — E876 Hypokalemia: Secondary | ICD-10-CM | POA: Diagnosis not present

## 2016-02-10 DIAGNOSIS — I5032 Chronic diastolic (congestive) heart failure: Secondary | ICD-10-CM

## 2016-02-10 LAB — PROTIME-INR: PROTIME: 27.3 s — AB (ref 10.0–13.8)

## 2016-02-10 LAB — POCT INR: INR: 2.5 — AB (ref <=1.1)

## 2016-02-10 LAB — LIPID PANEL
Cholesterol: 155 mg/dL (ref 0–200)
HDL: 49 mg/dL (ref 35–70)
LDL CALC: 83 mg/dL
Triglycerides: 115 mg/dL (ref 40–160)

## 2016-02-10 NOTE — Progress Notes (Signed)
Patient ID: [name], male   DOB: 27-Jun-1951, 65 y.o.   MRN: [id number]   Location:  [place] Room Number: 141 Place of Service:  SNF (31)   CODE STATUS: Full Code  No Known Allergies  Chief Complaint  Patient presents with  . Acute Visit    Lab follow up    HPI:  His k+ is very low at 2.6. He will require further adjustment of his k+ will also need to stop his zaroxolyn due to his renal function. He tells me that he is feeling good. He has no complaints or concerns at this time.    Past Medical History  Diagnosis Date  . Arthritis   . RCT (rotator cuff tear)   . Multiple abrasions     rt arm  . Morbid obesity ([place])   . Hypertension   . Hyperlipidemia   . COPD (chronic obstructive pulmonary disease) ([place])   . CHF (congestive heart failure) [facility])     Past Surgical History  Procedure Laterality Date  . No past surgeries    . Shoulder arthroscopy with subacromial decompression, rotator cuff repair and bicep tendon repair Right 12/28/2014    Procedure: RIGHT SHOULDER ARTHROSCOPY WITH SUBACROMIAL DECOMPRESSION, DISTAL CLAVICLE RESECTION, ROTATOR CUFF REPAIR ;  Surgeon: Sydnee Cabal, MD;  Location: [facility];  Service: Orthopedics;  Laterality: Right;  . Tracheostomy tube placement N/A 01/16/2015    Procedure: TRACHEOSTOMY;  Surgeon: Jodi Marble, MD;  Location: [place];  Service: ENT;  Laterality: N/A;  . Tracheostomy revision N/A 01/18/2015    Procedure: TRACHEOSTOMY REVISION;  Surgeon: Ruby Cola, MD;  Location: [place];  Service: ENT;  Laterality: N/A;  . Tracheostomy tube placement N/A 01/22/2015    Procedure: TRACHEOSTOMY;  Surgeon: Jodi Marble, MD;  Location: [place];  Service: ENT;  Laterality: N/A;  . [id number]      Social History   Social History  . Marital Status: Single    Spouse Name: N/A  . Number of Children: N/A  . Years of Education: N/A   Occupational History  . Not on file.   Social History Main Topics  . Smoking  status: Former Smoker -- 0.20 packs/day    Types: Cigarettes  . Smokeless tobacco: Not on file  . Alcohol Use: No  . Drug Use: No  . Sexual Activity: Not on file   Other Topics Concern  . Not on file   Social History Narrative   Family History  Problem Relation Age of Onset  . Heart disease Mother   . Heart attack Mother   . Heart disease Brother       VITAL SIGNS BP 146/78 mmHg  Pulse 76  Temp(Src) 97.6 F (36.4 C) (Oral)  Resp 18  Ht 6' (1.829 m)  Wt 232 lb (105.235 kg)  BMI 31.46 kg/m2  SpO2 96%  Patient's Medications  New Prescriptions   No medications on file  Previous Medications   ATORVASTATIN (LIPITOR) 20 MG TABLET    Take 20 mg by mouth daily.   CLONAZEPAM (KLONOPIN) 0.5 MG TABLET    Take 0.25 mg by mouth at bedtime.    DOCUSATE SODIUM (COLACE) 100 MG CAPSULE    Take 1 capsule (100 mg total) by mouth 2 (two) times daily.   DULOXETINE (CYMBALTA) 60 MG CAPSULE    Take 60 mg by mouth daily.   FAMOTIDINE (PEPCID) 20 MG TABLET    Take 20 mg by mouth daily.   FOLIC ACID (  FOLVITE) 1 MG TABLET    Take 1 mg by mouth daily.   FUROSEMIDE (LASIX) 40 MG TABLET    Take 1 tablet (40 mg total) by mouth daily.   HYDRALAZINE (APRESOLINE) 25 MG TABLET    Take 37.5 mg by mouth 3 (three) times daily.    IPRATROPIUM-ALBUTEROL (DUONEB) 0.5-2.5 (3) MG/3ML SOLN    Take 3 mLs by nebulization every 6 (six) hours as needed. For shortness of breath   ISOSORBIDE MONONITRATE (ISMO,MONOKET) 20 MG TABLET    Take 20 mg by mouth 2 (two) times daily at 10 AM and 5 PM.   LIDOCAINE (LIDODERM) 5 %    Place 1 patch onto the skin daily. Remove & Discard patch within 12 hours or as directed by MD To right shoulder   METOCLOPRAMIDE (REGLAN) 10 MG TABLET    Take 10 mg by mouth 4 (four) times daily -  before meals and at bedtime.   METOLAZONE (ZAROXOLYN) 5 MG TABLET    Take 5 mg by mouth daily.   OMEGA-3 FATTY ACIDS (FISH OIL) 1000 MG CAPS    Take 1,000 mg by mouth daily.    ONDANSETRON (ZOFRAN) 4 MG  TABLET    Take 4 mg by mouth every 6 (six) hours.   POLYETHYLENE GLYCOL (MIRALAX / GLYCOLAX) PACKET    Take 17 g by mouth daily.   POTASSIUM CHLORIDE SA (K-DUR,KLOR-CON) 20 MEQ TABLET    Take 2 tablets (40 mEq total) by mouth 4 (four) times daily.   WARFARIN (COUMADIN) 6 MG TABLET    Take 6.5 mg by mouth daily at 6 PM.   Modified Medications   No medications on file  Discontinued Medications   No medications on file     SIGNIFICANT DIAGNOSTIC EXAMS  12-29-14: testicle ultrasound: Two solid extratesticular right scrotal masses. Primary differential diagnosis for extratesticular masses include primary or metastatic neoplasms including adenomatoid tumor, fibroma, leiomyoma, mesothelioma, or sarcoma. Urology consultation is recommended. No sonographic evidence for testicular torsion or intratesticular mass.  01-01-15: 2-d echo: Left ventricle: The cavity size was normal. There was mild focal basal and mild concentric hypertrophy of the septum. Systolic function was normal. The estimated ejection fraction was in the range of 60% to 65%. Wall motion was normal; there were no regional wall motion abnormalities. Doppler parameters are consistent with abnormal left ventricular relaxation (grade 1 diastolic dysfunction). There was no evidence of elevate ventricular filling pressure by Doppler parameters. - Aortic valve: There was no regurgitation. - Aortic root: The aortic root was normal in size. - Mitral valve: Structurally normal valve. There was no regurgitation. - Right ventricle: Systolic function was normal. - Right atrium: The atrium was normal in size. - Tricuspid valve: There was trivial regurgitation. - Pulmonary arteries: Systolic pressure was within the normal range.   01-07-15: renal ultrasound: Limited examination demonstrating no acute abnormality. Negative for Hydronephrosis. Possible nonobstructing stone lower pole left kidney.  01-18-15: IVC filter placement: Successful IVC filter  placement. This is temporary or can remain in place to become permanent.  01-28-15: ct of head: 1. No acute intracranial findings demonstrated. Mild atrophy and periventricular white matter disease. 2. Bilateral mastoid effusions with opacification of the left middle ear.  06-17-15: chest x-ray; Cardiomegaly with mild pulmonary vascular congestion  06-19-15: ct guided prostate abscess drain: Fluid collection along the right side of the prostate compatible with an abscess. 30 mL of purulent fluid was removed.  06-24-15: ct of abdomen and pelvis: Fatty infiltration of the liver.  Bilateral nonobstructive nephrolithiasis. Atherosclerosis of abdominal aorta without aneurysm formation. Sigmoid diverticulosis is noted without inflammation. Interval placement of transgluteal drainage catheter into right side of the pelvis. Fluid collection noted adjacent to prostate gland on prior exam has been completely decompressed.  06-25-15: kub: Moderate ileus pattern.    LABS REVIEWED:   02-13-15: hgb a1c 6.3 03-14-15: wbc 8.5; hgb 12.;1 hct 36.3; mcv 93.1; plt 251; glucose 112; bun 20; creat 0.82; k+3.0; na++135; phos 3.7; mag 1.2; albumin 3.2 03-25-15: wbc 8.5; hgb 11.7; hct 36.4; mcv 95.0; plt 240; glucose 111; bun 20; creat 1.01; k+3.4; na++138  04-01-15: glucose 112; bun 19; creat 0.97; k+3.3; na++139 04-03-15: inr 1.63 coumadin 6 mg 04-09-15: inr 2.16 coumadin 7 mg 04-15-15: inr 2.16 coumadin 7 mg 04-12-15: glucose 101; bun 20; creat 0.99; k+3.3; na++ 140 06-17-15: wbc 12.6; hgb 13.6; hct 41.0; mcv 86.1; plt 282; glucose 137; bun 24; creat 0.90; k+ 3.3; na++137; alk phos  173; albumin 3.1; blood culture: no growth 06-18-15: hgb a1c 6.4 06-20-15: wbv 9.6; hgb 11.6; hct 36.7; mvc 87.4 plt 335 06-22-15: wbc 11.7; hgb 13.1; hct 40.3; mcv 85.4; plt 420; glucose 114; bun 19; creat 0.95; k+ 3.4; na++133 06-25-15: wbc 11.4; hgb 13.1; hct 41.1; mcv 84.7; plt 461; glucose 128; bun 36; creat 1.23; k+ 3.3; na++135; mag  2.1  06-28-15: wbc 11.;5 hgb 13.5; hct 39.6; mcv 82.2; plt 462; glucose 106; bun 40; creat 1.44; k+ 3.6; na++134 07-01-15: INR 2.19 07-29-15; INR 2.68  11-11-15: inr 2.26: coumadin 6 mg  12-02-15: INR 2.56 11-17-15: liver normal albumin 3.6; chol 154; ldl 92; trig 101; hdl 42  02-10-16: wbc 9.5; hgb 13.8; hct 40.4; mcv 82.3 ;plt 322; glucose 98; bun 35; creat 1.46; k+ 2.6; na++ 132; alk phos 174; albumin 3.9; chol 155; ldl 83; trig 115; hdl 49      Review of Systems Constitutional: Negative for appetite change and fatigue.  HENT: Negative for congestion.   Respiratory: Negative for cough, chest tightness and shortness of breath.   Cardiovascular: Negative for chest pain, palpitations and leg swelling.  Gastrointestinal: Negative for nausea, abdominal pain, diarrhea and constipation.  Musculoskeletal: Negative for myalgias and arthralgias.  Skin: Negative for pallor.  Neurological: Negative for dizziness.  Psychiatric/Behavioral: The patient is not nervous/anxious.     Physical Exam Constitutional: He is oriented to person, place, and time. No distress. is obese  Eyes: Conjunctivae are normal.  Neck: Neck supple. No JVD present. No thyromegaly present.  Cardiovascular: Normal rate, regular rhythm and intact distal pulses.   Respiratory: Effort normal and breath sounds normal. No respiratory distress. He has no wheezes.  Has capped trach   GI: Soft. Bowel sounds are normal. He exhibits no distension. There is no tenderness.    Musculoskeletal: no lower extremity edema   Able to move all extremities  Bilateral lower extremities discolored from impaired circulation   Lymphadenopathy:    He has no cervical adenopathy.  Neurological: He is alert and oriented to person, place, and time.  Skin: Skin is warm and dry. He is not diaphoretic.   Psychiatric: He has a normal mood and affect.     ASSESSMENT/ PLAN:  1. Diastolic heart failure: will continue lasix 40 mg daily will stop the  zaroxolyn due to his renal function    2. Hypokalemia: will continue k+ 40 meq four times daily will give an 40 meq X2 today and will check bmp on 02-12-16.      Ok Edwards NP  [place] [phone] Monday through Friday 8am- 5pm  After hours call [phone]

## 2016-02-13 ENCOUNTER — Encounter: Payer: Self-pay | Admitting: Adult Health

## 2016-02-13 ENCOUNTER — Non-Acute Institutional Stay: Payer: BLUE CROSS/BLUE SHIELD | Admitting: Adult Health

## 2016-02-13 DIAGNOSIS — E876 Hypokalemia: Secondary | ICD-10-CM

## 2016-02-13 DIAGNOSIS — I5032 Chronic diastolic (congestive) heart failure: Secondary | ICD-10-CM | POA: Diagnosis not present

## 2016-02-13 LAB — BASIC METABOLIC PANEL
BUN: 26 mg/dL — AB (ref 4–21)
Creatinine: 1.2 mg/dL (ref 0.6–1.3)
Glucose: 112 mg/dL
Potassium: 2.7 mmol/L — AB (ref 3.4–5.3)
SODIUM: 132 mmol/L — AB (ref 137–147)

## 2016-02-13 NOTE — Progress Notes (Signed)
Location:  [place] of Service:  SNF (31)   CODE STATUS: full code   No Known Allergies  Chief Complaint  Patient presents with  . Acute Visit    follow up hypokalemia     HPI:  He continues to struggle with his electrolyte balance. His lab from today is pending. Unfortunately the zaroxolyn was not stopped. He tells me that he is feeling good. At this time there are no nursing concerns at this time.   Past Medical History  Diagnosis Date  . Arthritis   . RCT (rotator cuff tear)   . Multiple abrasions     rt arm  . Morbid obesity ([place])   . Hypertension   . Hyperlipidemia   . COPD (chronic obstructive pulmonary disease) ([place])   . CHF (congestive heart failure) [facility])     Past Surgical History  Procedure Laterality Date  . No past surgeries    . Shoulder arthroscopy with subacromial decompression, rotator cuff repair and bicep tendon repair Right 12/28/2014    Procedure: RIGHT SHOULDER ARTHROSCOPY WITH SUBACROMIAL DECOMPRESSION, DISTAL CLAVICLE RESECTION, ROTATOR CUFF REPAIR ;  Surgeon: Sydnee Cabal, MD;  Location: [facility];  Service: Orthopedics;  Laterality: Right;  . Tracheostomy tube placement N/A 01/16/2015    Procedure: TRACHEOSTOMY;  Surgeon: Jodi Marble, MD;  Location: [place];  Service: ENT;  Laterality: N/A;  . Tracheostomy revision N/A 01/18/2015    Procedure: TRACHEOSTOMY REVISION;  Surgeon: Ruby Cola, MD;  Location: [place];  Service: ENT;  Laterality: N/A;  . Tracheostomy tube placement N/A 01/22/2015    Procedure: TRACHEOSTOMY;  Surgeon: Jodi Marble, MD;  Location: [place];  Service: ENT;  Laterality: N/A;  . [id number]      Social History   Social History  . Marital Status: Single    Spouse Name: N/A  . Number of Children: N/A  . Years of Education: N/A   Occupational History  . Not on file.   Social History Main Topics  . Smoking status: Former Smoker -- 0.20 packs/day    Types: Cigarettes  . Smokeless tobacco: Not on file  .  Alcohol Use: No  . Drug Use: No  . Sexual Activity: Not on file   Other Topics Concern  . Not on file   Social History Narrative   Family History  Problem Relation Age of Onset  . Heart disease Mother   . Heart attack Mother   . Heart disease Brother       VITAL SIGNS BP 130/70 mmHg  Pulse 78  Temp(Src) 97.9 F (36.6 C)  Resp 18  Ht 6' (1.829 m)  Wt 227 lb 9.6 oz (103.239 kg)  BMI 30.86 kg/m2  SpO2 96%  Patient's Medications  New Prescriptions   No medications on file  Previous Medications   ATORVASTATIN (LIPITOR) 20 MG TABLET    Take 20 mg by mouth daily.   CLONAZEPAM (KLONOPIN) 0.5 MG TABLET    Take 0.25 mg by mouth at bedtime.    DOCUSATE SODIUM (COLACE) 100 MG CAPSULE    Take 1 capsule (100 mg total) by mouth 2 (two) times daily.   DULOXETINE (CYMBALTA) 60 MG CAPSULE    Take 60 mg by mouth daily.   FAMOTIDINE (PEPCID) 20 MG TABLET    Take 20 mg by mouth daily.   FOLIC ACID (FOLVITE) 1 MG TABLET    Take 1 mg by mouth daily.   FUROSEMIDE (LASIX) 40 MG TABLET  Take 1 tablet (40 mg total) by mouth daily.   HYDRALAZINE (APRESOLINE) 25 MG TABLET    Take 37.5 mg by mouth 3 (three) times daily.    IPRATROPIUM-ALBUTEROL (DUONEB) 0.5-2.5 (3) MG/3ML SOLN    Take 3 mLs by nebulization every 6 (six) hours as needed. For shortness of breath   ISOSORBIDE MONONITRATE (ISMO,MONOKET) 20 MG TABLET    Take 20 mg by mouth 2 (two) times daily at 10 AM and 5 PM.   LIDOCAINE (LIDODERM) 5 %    Place 1 patch onto the skin daily. Remove & Discard patch within 12 hours or as directed by MD To right shoulder   METOCLOPRAMIDE (REGLAN) 10 MG TABLET    Take 10 mg by mouth 4 (four) times daily -  before meals and at bedtime.   METOLAZONE (ZAROXOLYN) 5 MG TABLET    Take 5 mg by mouth daily.   OMEGA-3 FATTY ACIDS (FISH OIL) 1000 MG CAPS    Take 1,000 mg by mouth daily.    ONDANSETRON (ZOFRAN) 4 MG TABLET    Take 4 mg by mouth every 6 (six) hours.   POLYETHYLENE GLYCOL (MIRALAX / GLYCOLAX) PACKET     Take 17 g by mouth daily.   POTASSIUM CHLORIDE SA (K-DUR,KLOR-CON) 20 MEQ TABLET    Take 2 tablets (40 mEq total) by mouth 4 (four) times daily.   WARFARIN (COUMADIN) 6 MG TABLET    Take 6.5 mg by mouth daily at 6 PM.   Modified Medications   No medications on file  Discontinued Medications   No medications on file     SIGNIFICANT DIAGNOSTIC EXAMS   12-29-14: testicle ultrasound: Two solid extratesticular right scrotal masses. Primary differential diagnosis for extratesticular masses include primary or metastatic neoplasms including adenomatoid tumor, fibroma, leiomyoma, mesothelioma, or sarcoma. Urology consultation is recommended. No sonographic evidence for testicular torsion or intratesticular mass.  01-01-15: 2-d echo: Left ventricle: The cavity size was normal. There was mild focal basal and mild concentric hypertrophy of the septum. Systolic function was normal. The estimated ejection fraction was in the range of 60% to 65%. Wall motion was normal; there were no regional wall motion abnormalities. Doppler parameters are consistent with abnormal left ventricular relaxation (grade 1 diastolic dysfunction). There was no evidence of elevate ventricular filling pressure by Doppler parameters. - Aortic valve: There was no regurgitation. - Aortic root: The aortic root was normal in size. - Mitral valve: Structurally normal valve. There was no regurgitation. - Right ventricle: Systolic function was normal. - Right atrium: The atrium was normal in size. - Tricuspid valve: There was trivial regurgitation. - Pulmonary arteries: Systolic pressure was within the normal range.   01-07-15: renal ultrasound: Limited examination demonstrating no acute abnormality. Negative for Hydronephrosis. Possible nonobstructing stone lower pole left kidney.  01-18-15: IVC filter placement: Successful IVC filter placement. This is temporary or can remain in place to become permanent.  01-28-15: ct of head: 1.  No acute intracranial findings demonstrated. Mild atrophy and periventricular white matter disease. 2. Bilateral mastoid effusions with opacification of the left middle ear.  06-17-15: chest x-ray; Cardiomegaly with mild pulmonary vascular congestion  06-19-15: ct guided prostate abscess drain: Fluid collection along the right side of the prostate compatible with an abscess. 30 mL of purulent fluid was removed.  06-24-15: ct of abdomen and pelvis: Fatty infiltration of the liver. Bilateral nonobstructive nephrolithiasis. Atherosclerosis of abdominal aorta without aneurysm formation. Sigmoid diverticulosis is noted without inflammation. Interval placement of transgluteal drainage catheter  into right side of the pelvis. Fluid collection noted adjacent to prostate gland on prior exam has been completely decompressed.  06-25-15: kub: Moderate ileus pattern.    LABS REVIEWED:   03-14-15: wbc 8.5; hgb 12.;1 hct 36.3; mcv 93.1; plt 251; glucose 112; bun 20; creat 0.82; k+3.0; na++135; phos 3.7; mag 1.2; albumin 3.2 03-25-15: wbc 8.5; hgb 11.7; hct 36.4; mcv 95.0; plt 240; glucose 111; bun 20; creat 1.01; k+3.4; na++138  04-01-15: glucose 112; bun 19; creat 0.97; k+3.3; na++139 04-03-15: inr 1.63 coumadin 6 mg 04-09-15: inr 2.16 coumadin 7 mg 04-15-15: inr 2.16 coumadin 7 mg 04-12-15: glucose 101; bun 20; creat 0.99; k+3.3; na++ 140 06-17-15: wbc 12.6; hgb 13.6; hct 41.0; mcv 86.1; plt 282; glucose 137; bun 24; creat 0.90; k+ 3.3; na++137; alk phos  173; albumin 3.1; blood culture: no growth 06-18-15: hgb a1c 6.4 06-20-15: wbv 9.6; hgb 11.6; hct 36.7; mvc 87.4 plt 335 06-22-15: wbc 11.7; hgb 13.1; hct 40.3; mcv 85.4; plt 420; glucose 114; bun 19; creat 0.95; k+ 3.4; na++133 06-25-15: wbc 11.4; hgb 13.1; hct 41.1; mcv 84.7; plt 461; glucose 128; bun 36; creat 1.23; k+ 3.3; na++135; mag 2.1  06-28-15: wbc 11.;5 hgb 13.5; hct 39.6; mcv 82.2; plt 462; glucose 106; bun 40; creat 1.44; k+ 3.6;  na++134 07-01-15: INR 2.19 07-29-15; INR 2.68  11-11-15: inr 2.26: coumadin 6 mg  12-02-15: INR 2.56 11-17-15: liver normal albumin 3.6; chol 154; ldl 92; trig 101; hdl 42  02-10-16: wbc 9.5; hgb 13.8; hct 40.4; mcv 82.3 ;plt 322; glucose 98; bun 35; creat 1.46; k+ 2.6; na++ 132; alk phos 174; albumin 3.9; chol 155; ldl 83; trig 115; hdl 49      Review of Systems Constitutional: Negative for appetite change and fatigue.  HENT: Negative for congestion.   Respiratory: Negative for cough, chest tightness and shortness of breath.   Cardiovascular: Negative for chest pain, palpitations and leg swelling.  Gastrointestinal: Negative for nausea, abdominal pain, diarrhea and constipation.  Musculoskeletal: Negative for myalgias and arthralgias.  Skin: Negative for pallor.  Neurological: Negative for dizziness.  Psychiatric/Behavioral: The patient is not nervous/anxious.     Physical Exam Constitutional: He is oriented to person, place, and time. No distress. is obese  Eyes: Conjunctivae are normal.  Neck: Neck supple. No JVD present. No thyromegaly present.  Cardiovascular: Normal rate, regular rhythm and intact distal pulses.   Respiratory: Effort normal and breath sounds normal. No respiratory distress. He has no wheezes.  Has capped trach   GI: Soft. Bowel sounds are normal. He exhibits no distension. There is no tenderness.    Musculoskeletal: no lower extremity edema   Able to move all extremities  Bilateral lower extremities discolored from impaired circulation   Lymphadenopathy:    He has no cervical adenopathy.  Neurological: He is alert and oriented to person, place, and time.  Skin: Skin is warm and dry. He is not diaphoretic.   Psychiatric: He has a normal mood and affect.     ASSESSMENT/ PLAN:  1. Diastolic heart failure: will continue lasix 40 mg daily will stop the zaroxolyn due to his renal function    2. Hypokalemia: will continue k+ 40 meq four times daily his labs are  pending.          Ok Edwards NP [facility]  Contact [phone] Monday through Friday 8am- 5pm  After hours call [phone]

## 2016-02-14 ENCOUNTER — Emergency Department (HOSPITAL_COMMUNITY)
Admission: EM | Admit: 2016-02-14 | Discharge: 2016-02-14 | Disposition: A | Discharge status: Home / Self Care | Payer: BLUE CROSS/BLUE SHIELD | Attending: Emergency Medicine | Admitting: Emergency Medicine

## 2016-02-14 ENCOUNTER — Encounter (HOSPITAL_COMMUNITY): Payer: Self-pay | Admitting: Emergency Medicine

## 2016-02-14 DIAGNOSIS — I10 Essential (primary) hypertension: Secondary | ICD-10-CM | POA: Diagnosis not present

## 2016-02-14 DIAGNOSIS — Z87828 Personal history of other (healed) physical injury and trauma: Secondary | ICD-10-CM | POA: Insufficient documentation

## 2016-02-14 DIAGNOSIS — Z7901 Long term (current) use of anticoagulants: Secondary | ICD-10-CM | POA: Diagnosis not present

## 2016-02-14 DIAGNOSIS — J449 Chronic obstructive pulmonary disease, unspecified: Secondary | ICD-10-CM | POA: Insufficient documentation

## 2016-02-14 DIAGNOSIS — Z87891 Personal history of nicotine dependence: Secondary | ICD-10-CM | POA: Insufficient documentation

## 2016-02-14 DIAGNOSIS — E785 Hyperlipidemia, unspecified: Secondary | ICD-10-CM | POA: Insufficient documentation

## 2016-02-14 DIAGNOSIS — I509 Heart failure, unspecified: Secondary | ICD-10-CM | POA: Diagnosis not present

## 2016-02-14 DIAGNOSIS — E876 Hypokalemia: Secondary | ICD-10-CM | POA: Diagnosis not present

## 2016-02-14 DIAGNOSIS — Z8739 Personal history of other diseases of the musculoskeletal system and connective tissue: Secondary | ICD-10-CM | POA: Insufficient documentation

## 2016-02-14 DIAGNOSIS — Z79899 Other long term (current) drug therapy: Secondary | ICD-10-CM | POA: Diagnosis not present

## 2016-02-14 DIAGNOSIS — R7989 Other specified abnormal findings of blood chemistry: Secondary | ICD-10-CM | POA: Diagnosis present

## 2016-02-14 LAB — CBC WITH DIFFERENTIAL/PLATELET
BASOS ABS: 0 10*3/uL (ref 0.0–0.1)
BASOS PCT: 0 %
EOS ABS: 0.3 10*3/uL (ref 0.0–0.7)
EOS PCT: 3 %
HCT: 42.1 % (ref 39.0–52.0)
Hemoglobin: 14.2 g/dL (ref 13.0–17.0)
LYMPHS PCT: 30 %
Lymphs Abs: 2.8 10*3/uL (ref 0.7–4.0)
MCH: 27.5 pg (ref 26.0–34.0)
MCHC: 33.7 g/dL (ref 30.0–36.0)
MCV: 81.6 fL (ref 78.0–100.0)
MONO ABS: 1.1 10*3/uL — AB (ref 0.1–1.0)
Monocytes Relative: 12 %
Neutro Abs: 5.1 10*3/uL (ref 1.7–7.7)
Neutrophils Relative %: 55 %
Platelets: 326 10*3/uL (ref 150–400)
RBC: 5.16 MIL/uL (ref 4.22–5.81)
RDW: 15.4 % (ref 11.5–15.5)
WBC: 9.4 10*3/uL (ref 4.0–10.5)

## 2016-02-14 LAB — COMPREHENSIVE METABOLIC PANEL
ALBUMIN: 3.7 g/dL (ref 3.5–5.0)
ALT: 27 U/L (ref 17–63)
AST: 33 U/L (ref 15–41)
Alkaline Phosphatase: 174 U/L — ABNORMAL HIGH (ref 38–126)
Anion gap: 13 (ref 5–15)
BUN: 27 mg/dL — AB (ref 6–20)
CHLORIDE: 94 mmol/L — AB (ref 101–111)
CO2: 26 mmol/L (ref 22–32)
CREATININE: 1.42 mg/dL — AB (ref 0.61–1.24)
Calcium: 9.7 mg/dL (ref 8.9–10.3)
GFR calc Af Amer: 60 mL/min — ABNORMAL LOW (ref >60)
GFR calc non Af Amer: 51 mL/min — ABNORMAL LOW (ref >60)
GLUCOSE: 99 mg/dL (ref 65–99)
POTASSIUM: 3 mmol/L — AB (ref 3.5–5.1)
SODIUM: 133 mmol/L — AB (ref 135–145)
Total Bilirubin: 0.9 mg/dL (ref 0.3–1.2)
Total Protein: 8.1 g/dL (ref 6.5–8.1)

## 2016-02-14 MED ORDER — POTASSIUM CHLORIDE CRYS ER 20 MEQ PO TBCR
60.0000 meq | EXTENDED_RELEASE_TABLET | Freq: Once | ORAL | Status: AC
  Administered 2016-02-14: 60 meq via ORAL
  Filled 2016-02-14: qty 3

## 2016-02-14 NOTE — ED Notes (Signed)
Contacted [facility] regarding pt to be returning to facility and reviewed d/c instructions w/ RN. No further questions/concerns. Contacting [facility] for transport.

## 2016-02-14 NOTE — ED Notes (Signed)
[facility] arrived to transport pt to [facility].

## 2016-02-14 NOTE — Discharge Instructions (Signed)
Hypokalemia °Hypokalemia means that the amount of potassium in the blood is lower than normal. Potassium is a chemical, called an electrolyte, that helps regulate the amount of fluid in the body. It also stimulates muscle contraction and helps nerves function properly. Most of the body's potassium is inside of cells, and only a very small amount is in the blood. Because the amount in the blood is so small, minor changes can be life-threatening. °CAUSES °· Antibiotics. °· Diarrhea or vomiting. °· Using laxatives too much, which can cause diarrhea. °· Chronic kidney disease. °· Water pills (diuretics). °· Eating disorders (bulimia). °· Low magnesium level. °· Sweating a lot. °SIGNS AND SYMPTOMS °· Weakness. °· Constipation. °· Fatigue. °· Muscle cramps. °· Mental confusion. °· Skipped heartbeats or irregular heartbeat (palpitations). °· Tingling or numbness. °DIAGNOSIS  °Your health care provider can diagnose hypokalemia with blood tests. In addition to checking your potassium level, your health care provider may also check other lab tests. °TREATMENT °Hypokalemia can be treated with potassium supplements taken by mouth or adjustments in your [place] medicines. If your potassium level is very low, you may need to get potassium through a vein (IV) and be monitored in the hospital. A diet high in potassium is also helpful. Foods high in potassium are: °· Nuts, such as peanuts and pistachios. °· Seeds, such as sunflower seeds and pumpkin seeds. °· Peas, lentils, and lima beans. °· Whole grain and bran cereals and breads. °· Fresh fruit and vegetables, such as apricots, avocado, bananas, cantaloupe, kiwi, oranges, tomatoes, asparagus, and potatoes. °· Orange and tomato juices. °· Red meats. °· Fruit yogurt. °HOME CARE INSTRUCTIONS °· Take all medicines as prescribed by your health care provider. °· Maintain a healthy diet by including nutritious food, such as fruits, vegetables, nuts, whole grains, and lean meats. °· If  you are taking a laxative, be sure to follow the directions on the label. °SEEK MEDICAL CARE IF: °· Your weakness gets worse. °· You feel your heart pounding or racing. °· You are vomiting or having diarrhea. °· You are diabetic and having trouble keeping your blood glucose in the normal range. °SEEK IMMEDIATE MEDICAL CARE IF: °· You have chest pain, shortness of breath, or dizziness. °· You are vomiting or having diarrhea for more than 2 days. °· You faint. °MAKE SURE YOU:  °· Understand these instructions. °· Will watch your condition. °· Will get help right away if you are not doing well or get worse. °  °This information is not intended to replace advice given to you by your health care provider. Make sure you discuss any questions you have with your health care provider. °  °Document Released: 08/24/2005 Document Revised: 09/14/2014 Document Reviewed: 02/24/2013 °Elsevier Interactive Patient Education ©2016 [employer] ° °

## 2016-02-14 NOTE — ED Notes (Signed)
Pt arrives via EMS [facility] [address] with c/o low potassium 2.7 per SNF paperwork. Hx of the same this week. Takes lasix, per SNF also takes KChl supplements four times a day.

## 2016-02-14 NOTE — ED Provider Notes (Signed)
CSN: [id number]     Arrival date & time 02/14/16  0133 History   First MD Initiated Contact with Patient 02/14/16 0208     Chief Complaint  Patient presents with  . Abnormal Lab     (Consider location/radiation/quality/duration/timing/severity/associated sxs/prior Treatment) HPI Comments: The [facility] and was transported for evaluation and treatment of low potassium. He denies symptoms of pain, SOB, nausea, sweating or weakness. He states he takes oral potassium supplements, "2 pills 4 times a day".   The history is provided by the patient and the nursing home. No language interpreter was used.    Past Medical History  Diagnosis Date  . Arthritis   . RCT (rotator cuff tear)   . Multiple abrasions     rt arm  . Morbid obesity ([facility])   . Hypertension   . Hyperlipidemia   . COPD (chronic obstructive pulmonary disease) ([facility])   . CHF (congestive heart failure) [facility])    Past Surgical History  Procedure Laterality Date  . No past surgeries    . Shoulder arthroscopy with subacromial decompression, rotator cuff repair and bicep tendon repair Right 12/28/2014    Procedure: RIGHT SHOULDER ARTHROSCOPY WITH SUBACROMIAL DECOMPRESSION, DISTAL CLAVICLE RESECTION, ROTATOR CUFF REPAIR ;  Surgeon: Eugenia Mcalpineobert Collins, MD;  Location: [facility];  Service: Orthopedics;  Laterality: Right;  . Tracheostomy tube placement N/A 01/16/2015    Procedure: TRACHEOSTOMY;  Surgeon: Flo ShanksKarol Wolicki, MD;  [facility];  Service: ENT;  Laterality: N/A;  . Tracheostomy revision N/A 01/18/2015    Procedure: TRACHEOSTOMY REVISION;  Surgeon: Melvenia BeamMitchell [name], MD;  [facility];  Service: ENT;  Laterality: N/A;  . Tracheostomy tube placement N/A 01/22/2015    Procedure: TRACHEOSTOMY;  Surgeon: Flo ShanksKarol Wolicki, MD;  [facility];  Service: ENT;  Laterality: N/A;  . [id number]     Family History  Problem Relation Age of Onset  . Heart disease Mother   . Heart attack Mother   . Heart disease Brother     Social History  Substance Use Topics  . Smoking status: Former Smoker -- 0.20 packs/day    Types: Cigarettes  . Smokeless tobacco: None  . Alcohol Use: No    Review of Systems  Constitutional: Negative for fever and chills.  HENT: Negative.   Respiratory: Negative.   Cardiovascular: Negative.   Gastrointestinal: Negative.   Musculoskeletal: Negative.   Skin: Negative.   Neurological: Negative.       Allergies  Review of patient's allergies indicates no known allergies.  Home Medications   Prior to Admission medications   Medication Sig Start Date End Date Taking? Authorizing Provider  atorvastatin (LIPITOR) 20 MG tablet Take 20 mg by mouth daily.    Historical Provider, MD  clonazePAM (KLONOPIN) 0.5 MG tablet Take 0.25 mg by mouth at bedtime.     Historical Provider, MD  docusate sodium (COLACE) 100 MG capsule Take 1 capsule (100 mg total) by mouth 2 (two) times daily. 06/26/15   Dorothea OgleIskra M Myers, MD  DULoxetine (CYMBALTA) 60 MG capsule Take 60 mg by mouth daily.    Historical Provider, MD  famotidine (PEPCID) 20 MG tablet Take 20 mg by mouth daily.    Historical Provider, MD  folic acid (FOLVITE) 1 MG tablet Take 1 mg by mouth daily.    Historical Provider, MD  furosemide (LASIX) 40 MG tablet Take 1 tablet (40 mg total) by mouth daily. 06/13/15   Ruben ImJeremy Ford, MD  hydrALAZINE (APRESOLINE) 25 MG tablet  Take 37.5 mg by mouth 3 (three) times daily.     Historical Provider, MD  ipratropium-albuterol (DUONEB) 0.5-2.5 (3) MG/3ML SOLN Take 3 mLs by nebulization every 6 (six) hours as needed. For shortness of breath    Historical Provider, MD  isosorbide mononitrate (ISMO,MONOKET) 20 MG tablet Take 20 mg by mouth 2 (two) times daily at 10 AM and 5 PM.    Historical Provider, MD  lidocaine (LIDODERM) 5 % Place 1 patch onto the skin daily. Remove & Discard patch within 12 hours or as directed by MD To right shoulder    Historical Provider, MD  metoCLOPramide (REGLAN) 10 MG tablet Take  10 mg by mouth 4 (four) times daily -  before meals and at bedtime.    Historical Provider, MD  metolazone (ZAROXOLYN) 5 MG tablet Take 5 mg by mouth daily.    Historical Provider, MD  Omega-3 Fatty Acids (FISH OIL) 1000 MG CAPS Take 1,000 mg by mouth daily.     Historical Provider, MD  ondansetron (ZOFRAN) 4 MG tablet Take 4 mg by mouth every 6 (six) hours.    Historical Provider, MD  polyethylene glycol (MIRALAX / GLYCOLAX) packet Take 17 g by mouth daily. 06/26/15   Dorothea Ogle, MD  potassium chloride SA (K-DUR,KLOR-CON) 20 MEQ tablet Take 2 tablets (40 mEq total) by mouth 4 (four) times daily. 04/22/15   Sharee Holster, NP  warfarin (COUMADIN) 6 MG tablet Take 6.5 mg by mouth daily at 6 PM.     Historical Provider, MD   BP 114/80 mmHg  Pulse 71  Temp(Src) 98.1 F (36.7 C) (Oral)  Resp 20  SpO2 98% Physical Exam  Constitutional: He is oriented to person, place, and time. He appears well-developed and well-nourished.  HENT:  Head: Normocephalic.  Neck: Normal range of motion. Neck supple.  Cardiovascular: Normal rate and regular rhythm.   No murmur heard. Pulmonary/Chest: Effort normal and breath sounds normal.  Abdominal: Soft. Bowel sounds are normal. There is no tenderness. There is no rebound and no guarding.  Musculoskeletal: Normal range of motion. He exhibits no edema.  Neurological: He is alert and oriented to person, place, and time.  Skin: Skin is warm and dry. No rash noted.  Psychiatric: He has a normal mood and affect.    ED Course  Procedures (including critical care time) Labs Review Labs Reviewed  CBC WITH DIFFERENTIAL/PLATELET - Abnormal; Notable for the following:    Monocytes Absolute 1.1 (*)    All other components within normal limits  COMPREHENSIVE METABOLIC PANEL - Abnormal; Notable for the following:    Sodium 133 (*)    Potassium 3.0 (*)    Chloride 94 (*)    BUN 27 (*)    Creatinine, Ser 1.42 (*)    Alkaline Phosphatase 174 (*)    GFR calc non  Af Amer 51 (*)    GFR calc Af Amer 60 (*)    All other components within normal limits    Imaging Review No results found. I have personally reviewed and evaluated these images and lab results as part of my medical decision-making.   EKG Interpretation   Date/Time:  Friday February 14 2016 01:49:08 EDT Ventricular Rate:  71 PR Interval:  176 QRS Duration: 134 QT Interval:  414 QTC Calculation: 449 R Axis:   86 Text Interpretation:  Normal sinus rhythm Right bundle branch block  Abnormal ECG No significant change since last tracing Confirmed by Erroll Luna [phone]) on  02/14/2016 2:40:51 AM      MDM   Final diagnoses:  None    1. Hypokalemia  Patient presents by nursing home directive for potassium repletion. K+ found to be mildly low here at 3.0. 60 mEq K+ provided. He is felt stable for discharge back to the facility.    Elpidio Anis, PA-C 02/14/16 1610  Tomasita Crumble, MD 02/14/16 [phone]

## 2016-02-17 ENCOUNTER — Encounter: Payer: Self-pay | Admitting: Adult Health

## 2016-02-17 ENCOUNTER — Non-Acute Institutional Stay: Payer: BLUE CROSS/BLUE SHIELD | Admitting: Adult Health

## 2016-02-17 ENCOUNTER — Ambulatory Visit (HOSPITAL_BASED_OUTPATIENT_CLINIC_OR_DEPARTMENT_OTHER): Payer: BLUE CROSS/BLUE SHIELD

## 2016-02-17 DIAGNOSIS — N182 Chronic kidney disease, stage 2 (mild): Secondary | ICD-10-CM | POA: Diagnosis not present

## 2016-02-17 DIAGNOSIS — I110 Hypertensive heart disease with heart failure: Secondary | ICD-10-CM | POA: Diagnosis not present

## 2016-02-17 DIAGNOSIS — E876 Hypokalemia: Secondary | ICD-10-CM

## 2016-02-17 DIAGNOSIS — I5032 Chronic diastolic (congestive) heart failure: Secondary | ICD-10-CM

## 2016-02-17 LAB — BASIC METABOLIC PANEL
BUN: 31 mg/dL — AB (ref 4–21)
CREATININE: 1.5 mg/dL — AB (ref 0.6–1.3)
Glucose: 83 mg/dL
Potassium: 3.2 mmol/L — AB (ref 3.4–5.3)
SODIUM: 136 mmol/L — AB (ref 137–147)

## 2016-02-17 MED ORDER — POTASSIUM CHLORIDE CRYS ER 20 MEQ PO TBCR: 40.0000 meq | EXTENDED_RELEASE_TABLET | Freq: Two times a day (BID) | ORAL | Status: DC

## 2016-02-17 MED ORDER — HYDRALAZINE HCL 25 MG PO TABS: 37.5000 mg | ORAL_TABLET | Freq: Three times a day (TID) | ORAL | Status: DC

## 2016-02-17 MED ORDER — TRIAMTERENE 50 MG PO CAPS: 50.0000 mg | ORAL_CAPSULE | Freq: Every day | ORAL | Status: DC

## 2016-02-17 NOTE — Progress Notes (Signed)
Location:  [place] Room Number: 141 A Place of Service:  SNF (31)   CODE STATUS: full code   No Known Allergies  Chief Complaint  Patient presents with  . Acute Visit    follow up lab work     HPI:  His k+ is improving to 3.2 today. His renal function is slowly worsening. He does not voice any complaints or concerns at this time. He was taken the ED on 02-14-16 for his low k+.   Past Medical History  Diagnosis Date  . Arthritis   . RCT (rotator cuff tear)   . Multiple abrasions     rt arm  . Morbid obesity ([place])   . Hypertension   . Hyperlipidemia   . COPD (chronic obstructive pulmonary disease) ([place])   . CHF (congestive heart failure) [facility])     Past Surgical History  Procedure Laterality Date  . No past surgeries    . Shoulder arthroscopy with subacromial decompression, rotator cuff repair and bicep tendon repair Right 12/28/2014    Procedure: RIGHT SHOULDER ARTHROSCOPY WITH SUBACROMIAL DECOMPRESSION, DISTAL CLAVICLE RESECTION, ROTATOR CUFF REPAIR ;  Surgeon: Sydnee Cabal, MD;  Location: [facility];  Service: Orthopedics;  Laterality: Right;  . Tracheostomy tube placement N/A 01/16/2015    Procedure: TRACHEOSTOMY;  Surgeon: Jodi Marble, MD;  Location: [place];  Service: ENT;  Laterality: N/A;  . Tracheostomy revision N/A 01/18/2015    Procedure: TRACHEOSTOMY REVISION;  Surgeon: Ruby Cola, MD;  Location: [place];  Service: ENT;  Laterality: N/A;  . Tracheostomy tube placement N/A 01/22/2015    Procedure: TRACHEOSTOMY;  Surgeon: Jodi Marble, MD;  Location: [place];  Service: ENT;  Laterality: N/A;  . [id number]      Social History   Social History  . Marital Status: Single    Spouse Name: N/A  . Number of Children: N/A  . Years of Education: N/A   Occupational History  . Not on file.   Social History Main Topics  . Smoking status: Former Smoker -- 0.20 packs/day    Types: Cigarettes  . Smokeless tobacco: Not on file  .  Alcohol Use: No  . Drug Use: No  . Sexual Activity: Not on file   Other Topics Concern  . Not on file   Social History Narrative   Family History  Problem Relation Age of Onset  . Heart disease Mother   . Heart attack Mother   . Heart disease Brother       VITAL SIGNS BP 100/70 mmHg  Pulse 76  Temp(Src) 98 F (36.7 C)  Resp 18  Ht 6' (1.829 m)  Wt 227 lb (102.967 kg)  BMI 30.78 kg/m2  SpO2 95%  Patient's Medications  New Prescriptions   No medications on file  Previous Medications   ATORVASTATIN (LIPITOR) 20 MG TABLET    Take 20 mg by mouth daily.   CLONAZEPAM (KLONOPIN) 0.5 MG TABLET    Take 0.25 mg by mouth at bedtime.    DOCUSATE SODIUM (COLACE) 100 MG CAPSULE    Take 1 capsule (100 mg total) by mouth 2 (two) times daily.   DULOXETINE (CYMBALTA) 60 MG CAPSULE    Take 60 mg by mouth daily.   FAMOTIDINE (PEPCID) 20 MG TABLET    Take 20 mg by mouth daily.   FOLIC ACID (FOLVITE) 1 MG TABLET    Take 1 mg by mouth daily.   FUROSEMIDE (LASIX) 40 MG TABLET  Take 1 tablet (40 mg total) by mouth daily.   HYDRALAZINE (APRESOLINE) 25 MG TABLET    Take 37.5 mg by mouth 3 (three) times daily.    IPRATROPIUM-ALBUTEROL (DUONEB) 0.5-2.5 (3) MG/3ML SOLN    Take 3 mLs by nebulization every 6 (six) hours as needed. For shortness of breath   ISOSORBIDE MONONITRATE (ISMO,MONOKET) 20 MG TABLET    Take 20 mg by mouth 2 (two) times daily at 10 AM and 5 PM.   LIDOCAINE (LIDODERM) 5 %    Place 1 patch onto the skin daily. Remove & Discard patch within 12 hours or as directed by MD To right shoulder   METOCLOPRAMIDE (REGLAN) 10 MG TABLET    Take 10 mg by mouth 4 (four) times daily -  before meals and at bedtime.   OMEGA-3 FATTY ACIDS (FISH OIL) 1000 MG CAPS    Take 1,000 mg by mouth daily.    ONDANSETRON (ZOFRAN) 4 MG TABLET    Take 4 mg by mouth every 6 (six) hours.   POLYETHYLENE GLYCOL (MIRALAX / GLYCOLAX) PACKET    Take 17 g by mouth daily.   POTASSIUM CHLORIDE SA (K-DUR,KLOR-CON) 20  MEQ TABLET    Take 2 tablets (40 mEq total) by mouth 4 (four) times daily.   WARFARIN (COUMADIN) 6 MG TABLET    Take 6.5 mg by mouth daily at 6 PM.   Modified Medications   No medications on file  Discontinued Medications   METOLAZONE (ZAROXOLYN) 5 MG TABLET    Take 5 mg by mouth daily.     SIGNIFICANT DIAGNOSTIC EXAMS   12-29-14: testicle ultrasound: Two solid extratesticular right scrotal masses. Primary differential diagnosis for extratesticular masses include primary or metastatic neoplasms including adenomatoid tumor, fibroma, leiomyoma, mesothelioma, or sarcoma. Urology consultation is recommended. No sonographic evidence for testicular torsion or intratesticular mass.  01-01-15: 2-d echo: Left ventricle: The cavity size was normal. There was mild focal basal and mild concentric hypertrophy of the septum. Systolic function was normal. The estimated ejection fraction was in the range of 60% to 65%. Wall motion was normal; there were no regional wall motion abnormalities. Doppler parameters are consistent with abnormal left ventricular relaxation (grade 1 diastolic dysfunction). There was no evidence of elevate ventricular filling pressure by Doppler parameters. - Aortic valve: There was no regurgitation. - Aortic root: The aortic root was normal in size. - Mitral valve: Structurally normal valve. There was no regurgitation. - Right ventricle: Systolic function was normal. - Right atrium: The atrium was normal in size. - Tricuspid valve: There was trivial regurgitation. - Pulmonary arteries: Systolic pressure was within the normal range.   01-07-15: renal ultrasound: Limited examination demonstrating no acute abnormality. Negative for Hydronephrosis. Possible nonobstructing stone lower pole left kidney.  01-18-15: IVC filter placement: Successful IVC filter placement. This is temporary or can remain in place to become permanent.  01-28-15: ct of head: 1. No acute intracranial findings  demonstrated. Mild atrophy and periventricular white matter disease. 2. Bilateral mastoid effusions with opacification of the left middle ear.  06-17-15: chest x-ray; Cardiomegaly with mild pulmonary vascular congestion  06-19-15: ct guided prostate abscess drain: Fluid collection along the right side of the prostate compatible with an abscess. 30 mL of purulent fluid was removed.  06-24-15: ct of abdomen and pelvis: Fatty infiltration of the liver. Bilateral nonobstructive nephrolithiasis. Atherosclerosis of abdominal aorta without aneurysm formation. Sigmoid diverticulosis is noted without inflammation. Interval placement of transgluteal drainage catheter into right side of the pelvis.  Fluid collection noted adjacent to prostate gland on prior exam has been completely decompressed.  06-25-15: kub: Moderate ileus pattern.    LABS REVIEWED:   02-13-15: hgb a1c 6.3 03-14-15: wbc 8.5; hgb 12.;1 hct 36.3; mcv 93.1; plt 251; glucose 112; bun 20; creat 0.82; k+3.0; na++135; phos 3.7; mag 1.2; albumin 3.2 03-25-15: wbc 8.5; hgb 11.7; hct 36.4; mcv 95.0; plt 240; glucose 111; bun 20; creat 1.01; k+3.4; na++138  04-01-15: glucose 112; bun 19; creat 0.97; k+3.3; na++139 04-03-15: inr 1.63 coumadin 6 mg 04-09-15: inr 2.16 coumadin 7 mg 04-15-15: inr 2.16 coumadin 7 mg 04-12-15: glucose 101; bun 20; creat 0.99; k+3.3; na++ 140 06-17-15: wbc 12.6; hgb 13.6; hct 41.0; mcv 86.1; plt 282; glucose 137; bun 24; creat 0.90; k+ 3.3; na++137; alk phos  173; albumin 3.1; blood culture: no growth 06-18-15: hgb a1c 6.4 06-20-15: wbv 9.6; hgb 11.6; hct 36.7; mvc 87.4 plt 335 06-22-15: wbc 11.7; hgb 13.1; hct 40.3; mcv 85.4; plt 420; glucose 114; bun 19; creat 0.95; k+ 3.4; na++133 06-25-15: wbc 11.4; hgb 13.1; hct 41.1; mcv 84.7; plt 461; glucose 128; bun 36; creat 1.23; k+ 3.3; na++135; mag 2.1  06-28-15: wbc 11.;5 hgb 13.5; hct 39.6; mcv 82.2; plt 462; glucose 106; bun 40; creat 1.44; k+ 3.6; na++134 07-01-15: INR  2.19 07-29-15; INR 2.68  11-11-15: inr 2.26: coumadin 6 mg  12-02-15: INR 2.56 11-17-15: liver normal albumin 3.6; chol 154; ldl 92; trig 101; hdl 42  02-10-16: wbc 9.5; hgb 13.8; hct 40.4; mcv 82.3 ;plt 322; glucose 98; bun 35; creat 1.46; k+ 2.6; na++ 132; alk phos 174; albumin 3.9; chol 155; ldl 83; trig 115; hdl 49  02-13-16: glucose 112; bun 26; creat 1.25; k+ 2.7; na++ 132  02-14-16: wbc 9.4; hgb 14.2; hct 42.1; mcv 81.6; plt 326; glucose 99; bun 27 creat 1.42; k+ 3.0; na++ 133; alk phos 17 ast 33; alt 27; albumin 3.7  02-17-16: glucose 83; bun 31; creat 1.53; k+ 3.2; na++ 136      Review of Systems Constitutional: Negative for appetite change and fatigue.  HENT: Negative for congestion.   Respiratory: Negative for cough, chest tightness and shortness of breath.   Cardiovascular: Negative for chest pain, palpitations and leg swelling.  Gastrointestinal: Negative for nausea, abdominal pain, diarrhea and constipation.  Musculoskeletal: Negative for myalgias and arthralgias.  Skin: Negative for pallor.  Neurological: Negative for dizziness.  Psychiatric/Behavioral: The patient is not nervous/anxious.     Physical Exam Constitutional: He is oriented to person, place, and time. No distress. is obese  Eyes: Conjunctivae are normal.  Neck: Neck supple. No JVD present. No thyromegaly present.  Cardiovascular: Normal rate, regular rhythm and intact distal pulses.   Respiratory: Effort normal and breath sounds normal. No respiratory distress. He has no wheezes.  Has  trach   GI: Soft. Bowel sounds are normal. He exhibits no distension. There is no tenderness.    Musculoskeletal: no lower extremity edema   Able to move all extremities  Bilateral lower extremities discolored from impaired circulation   Lymphadenopathy:    He has no cervical adenopathy.  Neurological: He is alert and oriented to person, place, and time.  Skin: Skin is warm and dry. He is not diaphoretic.   Psychiatric: He has a  normal mood and affect.      ASSESSMENT/ PLAN:  1. Hypokalemia 2. Renal failure stage II 3. Chronic diastolic heart failure 4. Hypertensive heart disease  I have discussed his status with Dr. Eulas Post.  Will stop the lasix Will begin triamterene 50 mg daily  Will lower hydralazine to 37.5 mg twice daily Will reduce k+ to 40 meq three times daily  Will check bmp on 02-20-16   Time spent with patient  45  minutes >50% time spent counseling; reviewing medical record; tests; labs; and developing future plan of care   Ok Edwards NP [facility]  Contact [phone] Monday through Friday 8am- 5pm  After hours call [phone]

## 2016-02-20 ENCOUNTER — Non-Acute Institutional Stay: Payer: BLUE CROSS/BLUE SHIELD | Admitting: Adult Health

## 2016-02-20 ENCOUNTER — Encounter: Payer: Self-pay | Admitting: Adult Health

## 2016-02-20 DIAGNOSIS — I110 Hypertensive heart disease with heart failure: Secondary | ICD-10-CM | POA: Diagnosis not present

## 2016-02-20 DIAGNOSIS — I5032 Chronic diastolic (congestive) heart failure: Secondary | ICD-10-CM

## 2016-02-20 DIAGNOSIS — E876 Hypokalemia: Secondary | ICD-10-CM

## 2016-02-20 LAB — BASIC METABOLIC PANEL
BUN: 26 mg/dL — AB (ref 4–21)
Creatinine: 1.6 mg/dL — AB (ref 0.6–1.3)
GLUCOSE: 93 mg/dL
Potassium: 3.8 mmol/L (ref 3.4–5.3)
Sodium: 133 mmol/L — AB (ref 137–147)

## 2016-02-20 NOTE — Progress Notes (Signed)
Patient ID: [name], male   DOB: 1950/11/24, 65 y.o.   MRN: [id number]   Location:  [place] Room Number: 141-A Place of Service:  SNF (31)   CODE STATUS: Full Code  No Known Allergies  Chief Complaint  Patient presents with  . Acute Visit    Hypokalemia    HPI:  His k+ level is 3.8. He is not voicing any concerns at this time. His weight has increased from 226 pounds on 6-12 to his [place] weight os 234 pounds; since coming off the lasix. He will need his lasix back; but will start him on a lower does.   Past Medical History  Diagnosis Date  . Arthritis   . RCT (rotator cuff tear)   . Multiple abrasions     rt arm  . Morbid obesity ([place])   . Hypertension   . Hyperlipidemia   . COPD (chronic obstructive pulmonary disease) ([place])   . CHF (congestive heart failure) [facility])     Past Surgical History  Procedure Laterality Date  . No past surgeries    . Shoulder arthroscopy with subacromial decompression, rotator cuff repair and bicep tendon repair Right 12/28/2014    Procedure: RIGHT SHOULDER ARTHROSCOPY WITH SUBACROMIAL DECOMPRESSION, DISTAL CLAVICLE RESECTION, ROTATOR CUFF REPAIR ;  Surgeon: Sydnee Cabal, MD;  Location: [facility];  Service: Orthopedics;  Laterality: Right;  . Tracheostomy tube placement N/A 01/16/2015    Procedure: TRACHEOSTOMY;  Surgeon: Jodi Marble, MD;  Location: [place];  Service: ENT;  Laterality: N/A;  . Tracheostomy revision N/A 01/18/2015    Procedure: TRACHEOSTOMY REVISION;  Surgeon: Ruby Cola, MD;  Location: [place];  Service: ENT;  Laterality: N/A;  . Tracheostomy tube placement N/A 01/22/2015    Procedure: TRACHEOSTOMY;  Surgeon: Jodi Marble, MD;  Location: [place];  Service: ENT;  Laterality: N/A;  . [id number]      Social History   Social History  . Marital Status: Single    Spouse Name: N/A  . Number of Children: N/A  . Years of Education: N/A   Occupational History  . Not on file.   Social History  Main Topics  . Smoking status: Former Smoker -- 0.20 packs/day    Types: Cigarettes  . Smokeless tobacco: Not on file  . Alcohol Use: No  . Drug Use: No  . Sexual Activity: Not on file   Other Topics Concern  . Not on file   Social History Narrative   Family History  Problem Relation Age of Onset  . Heart disease Mother   . Heart attack Mother   . Heart disease Brother       VITAL SIGNS BP 104/73 mmHg  Pulse 68  Temp(Src) 97.7 F (36.5 C) (Oral)  Resp 20  Ht 6' (1.829 m)  Wt 234 lb (106.142 kg)  BMI 31.73 kg/m2  SpO2 96%  Patient's Medications  New Prescriptions   No medications on file  Previous Medications   ATORVASTATIN (LIPITOR) 20 MG TABLET    Take 20 mg by mouth daily.   CLONAZEPAM (KLONOPIN) 0.5 MG TABLET    Take 0.25 mg by mouth at bedtime.    DOCUSATE SODIUM (COLACE) 100 MG CAPSULE    Take 1 capsule (100 mg total) by mouth 2 (two) times daily.   DULOXETINE (CYMBALTA) 60 MG CAPSULE    Take 60 mg by mouth daily.   FAMOTIDINE (PEPCID) 20 MG TABLET    Take 20 mg by mouth daily.  FOLIC ACID (FOLVITE) 1 MG TABLET    Take 1 mg by mouth daily.   HYDRALAZINE (APRESOLINE) 25 MG TABLET    Take 1.5 tablets (37.5 mg total) by mouth 3 (three) times daily.   IPRATROPIUM-ALBUTEROL (DUONEB) 0.5-2.5 (3) MG/3ML SOLN    Take 3 mLs by nebulization every 6 (six) hours as needed. For shortness of breath   ISOSORBIDE MONONITRATE (ISMO,MONOKET) 20 MG TABLET    Take 20 mg by mouth 2 (two) times daily at 10 AM and 5 PM.   LIDOCAINE (LIDODERM) 5 %    Place 1 patch onto the skin daily. Remove & Discard patch within 12 hours or as directed by MD To right shoulder   METOCLOPRAMIDE (REGLAN) 10 MG TABLET    Take 10 mg by mouth 4 (four) times daily -  before meals and at bedtime.   OMEGA-3 FATTY ACIDS (FISH OIL) 1000 MG CAPS    Take 1,000 mg by mouth daily.    ONDANSETRON (ZOFRAN) 4 MG TABLET    Take 4 mg by mouth every 6 (six) hours.   POLYETHYLENE GLYCOL (MIRALAX / GLYCOLAX) PACKET     Take 17 g by mouth daily.   POTASSIUM CHLORIDE SA (K-DUR,KLOR-CON) 20 MEQ TABLET    Take 2 tablets (40 mEq total) by mouth 2 (two) times daily.   TRIAMTERENE (DYRENIUM) 50 MG CAPSULE    Take 1 capsule (50 mg total) by mouth daily.   WARFARIN (COUMADIN) 6 MG TABLET    Take 6.5 mg by mouth daily at 6 PM.   Modified Medications   No medications on file  Discontinued Medications   No medications on file     SIGNIFICANT DIAGNOSTIC EXAMS  12-29-14: testicle ultrasound: Two solid extratesticular right scrotal masses. Primary differential diagnosis for extratesticular masses include primary or metastatic neoplasms including adenomatoid tumor, fibroma, leiomyoma, mesothelioma, or sarcoma. Urology consultation is recommended. No sonographic evidence for testicular torsion or intratesticular mass.  01-01-15: 2-d echo: Left ventricle: The cavity size was normal. There was mild focal basal and mild concentric hypertrophy of the septum. Systolic function was normal. The estimated ejection fraction was in the range of 60% to 65%. Wall motion was normal; there were no regional wall motion abnormalities. Doppler parameters are consistent with abnormal left ventricular relaxation (grade 1 diastolic dysfunction). There was no evidence of elevate ventricular filling pressure by Doppler parameters. - Aortic valve: There was no regurgitation. - Aortic root: The aortic root was normal in size. - Mitral valve: Structurally normal valve. There was no regurgitation. - Right ventricle: Systolic function was normal. - Right atrium: The atrium was normal in size. - Tricuspid valve: There was trivial regurgitation. - Pulmonary arteries: Systolic pressure was within the normal range.   01-07-15: renal ultrasound: Limited examination demonstrating no acute abnormality. Negative for Hydronephrosis. Possible nonobstructing stone lower pole left kidney.  01-18-15: IVC filter placement: Successful IVC filter placement. This is  temporary or can remain in place to become permanent.  01-28-15: ct of head: 1. No acute intracranial findings demonstrated. Mild atrophy and periventricular white matter disease. 2. Bilateral mastoid effusions with opacification of the left middle ear.  06-17-15: chest x-ray; Cardiomegaly with mild pulmonary vascular congestion  06-19-15: ct guided prostate abscess drain: Fluid collection along the right side of the prostate compatible with an abscess. 30 mL of purulent fluid was removed.  06-24-15: ct of abdomen and pelvis: Fatty infiltration of the liver. Bilateral nonobstructive nephrolithiasis. Atherosclerosis of abdominal aorta without aneurysm formation. Sigmoid diverticulosis  is noted without inflammation. Interval placement of transgluteal drainage catheter into right side of the pelvis. Fluid collection noted adjacent to prostate gland on prior exam has been completely decompressed.  06-25-15: kub: Moderate ileus pattern.    LABS REVIEWED:   03-14-15: wbc 8.5; hgb 12.;1 hct 36.3; mcv 93.1; plt 251; glucose 112; bun 20; creat 0.82; k+3.0; na++135; phos 3.7; mag 1.2; albumin 3.2 03-25-15: wbc 8.5; hgb 11.7; hct 36.4; mcv 95.0; plt 240; glucose 111; bun 20; creat 1.01; k+3.4; na++138  04-01-15: glucose 112; bun 19; creat 0.97; k+3.3; na++139 04-03-15: inr 1.63 coumadin 6 mg 04-09-15: inr 2.16 coumadin 7 mg 04-15-15: inr 2.16 coumadin 7 mg 04-12-15: glucose 101; bun 20; creat 0.99; k+3.3; na++ 140 06-17-15: wbc 12.6; hgb 13.6; hct 41.0; mcv 86.1; plt 282; glucose 137; bun 24; creat 0.90; k+ 3.3; na++137; alk phos  173; albumin 3.1; blood culture: no growth 06-18-15: hgb a1c 6.4 06-20-15: wbv 9.6; hgb 11.6; hct 36.7; mvc 87.4 plt 335 06-22-15: wbc 11.7; hgb 13.1; hct 40.3; mcv 85.4; plt 420; glucose 114; bun 19; creat 0.95; k+ 3.4; na++133 06-25-15: wbc 11.4; hgb 13.1; hct 41.1; mcv 84.7; plt 461; glucose 128; bun 36; creat 1.23; k+ 3.3; na++135; mag 2.1  06-28-15: wbc 11.;5 hgb 13.5; hct  39.6; mcv 82.2; plt 462; glucose 106; bun 40; creat 1.44; k+ 3.6; na++134 07-01-15: INR 2.19 07-29-15; INR 2.68  11-11-15: inr 2.26: coumadin 6 mg  12-02-15: INR 2.56 11-17-15: liver normal albumin 3.6; chol 154; ldl 92; trig 101; hdl 42  02-13-20: wbc 9.5; hgb 13.8; hct 40.4; mcv 82.3 ;plt 322; glucose 98; bun 35; creat 1.46; k+ 2.6; na++ 132; alk phos 174; albumin 3.9; chol 155; ldl 83; trig 115; hdl 49  01-08-82: glucose 112; bun 26; creat 1.25; k+ 2.7; na++ 132  02-14-16: wbc 9.4; hgb 14.2; hct 42.1; mcv 81.6; plt 326; glucose 99; bun 27 creat 1.42; k+ 3.0; na++ 133; alk phos 17 ast 33; alt 27; albumin 3.7  02-17-16: glucose 83; bun 31; creat 1.53; k+ 3.2; na++ 136  02-20-16: glucose 93; bun 26; creat 1.59; k+ 3.8; na++ 133      Review of Systems Constitutional: Negative for appetite change and fatigue.  HENT: Negative for congestion.   Respiratory: Negative for cough, chest tightness and shortness of breath.   Cardiovascular: Negative for chest pain, palpitations and leg swelling.  Gastrointestinal: Negative for nausea, abdominal pain, diarrhea and constipation.  Musculoskeletal: Negative for myalgias and arthralgias.  Skin: Negative for pallor.  Neurological: Negative for dizziness.  Psychiatric/Behavioral: The patient is not nervous/anxious.     Physical Exam Constitutional: He is oriented to person, place, and time. No distress. is obese  Eyes: Conjunctivae are normal.  Neck: Neck supple. No JVD present. No thyromegaly present.  Cardiovascular: Normal rate, regular rhythm and intact distal pulses.   Respiratory: Effort normal and breath sounds normal. No respiratory distress. He has no wheezes.  Has  trach   GI: Soft. Bowel sounds are normal. He exhibits no distension. There is no tenderness.    Musculoskeletal: no lower extremity edema   Able to move all extremities  Bilateral lower extremities discolored from impaired circulation   Lymphadenopathy:    He has no cervical  adenopathy.  Neurological: He is alert and oriented to person, place, and time.  Skin: Skin is warm and dry. He is not diaphoretic.   Psychiatric: He has a normal mood and affect.      ASSESSMENT/ PLAN:  1.  Hypokalemia 2. Renal failure stage II 3. Chronic diastolic heart failure 4. Hypertensive heart disease  Will restart lasix at 20 mg daily Will return k+ to 40 meq four times daily  Will repeat bmp on 02-24-16;  Will monitor   Time spent with patient  30  minutes >50% time spent counseling; reviewing medical record; tests; labs; and developing future plan of care      Ok Edwards NP [facility]  Contact [phone] Monday through Friday 8am- 5pm  After hours call [phone]

## 2016-02-27 ENCOUNTER — Non-Acute Institutional Stay: Payer: BLUE CROSS/BLUE SHIELD | Admitting: Adult Health

## 2016-02-27 ENCOUNTER — Encounter: Payer: Self-pay | Admitting: Adult Health

## 2016-02-27 DIAGNOSIS — K219 Gastro-esophageal reflux disease without esophagitis: Secondary | ICD-10-CM

## 2016-02-27 DIAGNOSIS — R251 Tremor, unspecified: Secondary | ICD-10-CM | POA: Diagnosis not present

## 2016-02-27 LAB — OSMOLALITY, URINE
CHLORIDE, URINE RANDOM: 104
Osmolality, Ur: 351
Potassium Urine: 57 mmol/hr
Sodium, Ur: 53

## 2016-02-27 NOTE — Progress Notes (Signed)
Patient ID: [name], male   DOB: 09-15-1950, 65 y.o.   MRN: [id number]   Location:    Nursing Home Room Number: 141-A Place of Service:  SNF (31)   CODE STATUS: Full Code  No Known Allergies  Chief Complaint  Patient presents with  . Acute Visit    Tremors    HPI:  Staff reports that he has increased tremors present. There is concern that the reglan could be causing this side effect. He and I discussed this possibility and he is in agreement to come off the medication. He is concerned about increased heart burn; will taper this medication off.   Past Medical History  Diagnosis Date  . Arthritis   . RCT (rotator cuff tear)   . Multiple abrasions     rt arm  . Morbid obesity ([place])   . Hypertension   . Hyperlipidemia   . COPD (chronic obstructive pulmonary disease) ([place])   . CHF (congestive heart failure) [facility])     Past Surgical History  Procedure Laterality Date  . No past surgeries    . Shoulder arthroscopy with subacromial decompression, rotator cuff repair and bicep tendon repair Right 12/28/2014    Procedure: RIGHT SHOULDER ARTHROSCOPY WITH SUBACROMIAL DECOMPRESSION, DISTAL CLAVICLE RESECTION, ROTATOR CUFF REPAIR ;  Surgeon: Sydnee Cabal, MD;  Location: [facility];  Service: Orthopedics;  Laterality: Right;  . Tracheostomy tube placement N/A 01/16/2015    Procedure: TRACHEOSTOMY;  Surgeon: Jodi Marble, MD;  Location: [place];  Service: ENT;  Laterality: N/A;  . Tracheostomy revision N/A 01/18/2015    Procedure: TRACHEOSTOMY REVISION;  Surgeon: Ruby Cola, MD;  Location: [place];  Service: ENT;  Laterality: N/A;  . Tracheostomy tube placement N/A 01/22/2015    Procedure: TRACHEOSTOMY;  Surgeon: Jodi Marble, MD;  Location: [place];  Service: ENT;  Laterality: N/A;  . [id number]      Social History   Social History  . Marital Status: Single    Spouse Name: N/A  . Number of Children: N/A  . Years of Education: N/A   Occupational History  . Not on file.   Social  History Main Topics  . Smoking status: Former Smoker -- 0.20 packs/day    Types: Cigarettes  . Smokeless tobacco: Not on file  . Alcohol Use: No  . Drug Use: No  . Sexual Activity: Not on file   Other Topics Concern  . Not on file   Social History Narrative   Family History  Problem Relation Age of Onset  . Heart disease Mother   . Heart attack Mother   . Heart disease Brother       VITAL SIGNS BP 109/88 mmHg  Pulse 68  Temp(Src) 97.3 F (36.3 C) (Oral)  Resp 20  Ht 6' (1.829 m)  Wt 232 lb (105.235 kg)  BMI 31.46 kg/m2  SpO2 96%  Patient's Medications  New Prescriptions   No medications on file  Previous Medications   ATORVASTATIN (LIPITOR) 20 MG TABLET    Take 20 mg by mouth daily.   CLONAZEPAM (KLONOPIN) 0.5 MG TABLET    Take 0.25 mg by mouth at bedtime.    DOCUSATE SODIUM (COLACE) 100 MG CAPSULE    Take 1 capsule (100 mg total) by mouth 2 (two) times daily.   DULOXETINE (CYMBALTA) 60 MG CAPSULE    Take 60 mg by mouth daily.   FAMOTIDINE (PEPCID) 20 MG TABLET    Take 20 mg by mouth daily.   FOLIC ACID (FOLVITE)  1 MG TABLET    Take 1 mg by mouth daily.   FUROSEMIDE (LASIX) 20 MG TABLET    Take 20 mg by mouth daily.   HYDRALAZINE (APRESOLINE) 25 MG TABLET    Take 1.5 tablets (37.5 mg total) by mouth 3 (three) times daily.   IPRATROPIUM-ALBUTEROL (DUONEB) 0.5-2.5 (3) MG/3ML SOLN    Take 3 mLs by nebulization every 6 (six) hours as needed. For shortness of breath   ISOSORBIDE MONONITRATE (ISMO,MONOKET) 20 MG TABLET    Take 20 mg by mouth 2 (two) times daily at 10 AM and 5 PM.   LIDOCAINE (LIDODERM) 5 %    Place 1 patch onto the skin daily. Remove & Discard patch within 12 hours or as directed by MD To right shoulder   METOCLOPRAMIDE (REGLAN) 10 MG TABLET    Take 10 mg by mouth 4 (four) times daily -  before meals and at bedtime.   OMEGA-3 FATTY ACIDS (FISH OIL) 1000 MG CAPS    Take 1,000 mg by mouth daily.    ONDANSETRON (ZOFRAN) 4 MG TABLET    Take 4 mg by mouth  every 6 (six) hours.   POLYETHYLENE GLYCOL (MIRALAX / GLYCOLAX) PACKET    Take 17 g by mouth daily.   POTASSIUM (POTASSIMIN PO)    Take 40 mEq by mouth 4 (four) times daily.   TRIAMTERENE (DYRENIUM) 50 MG CAPSULE    Take 1 capsule (50 mg total) by mouth daily.   WARFARIN (COUMADIN) 6 MG TABLET    Take 6.5 mg by mouth daily at 6 PM.   Modified Medications   No medications on file  Discontinued Medications   POTASSIUM CHLORIDE SA (K-DUR,KLOR-CON) 20 MEQ TABLET    Take 2 tablets (40 mEq total) by mouth 2 (two) times daily.     SIGNIFICANT DIAGNOSTIC EXAMS  12-29-14: testicle ultrasound: Two solid extratesticular right scrotal masses. Primary differential diagnosis for extratesticular masses include primary or metastatic neoplasms including adenomatoid tumor, fibroma, leiomyoma, mesothelioma, or sarcoma. Urology consultation is recommended. No sonographic evidence for testicular torsion or intratesticular mass.  01-01-15: 2-d echo: Left ventricle: The cavity size was normal. There was mild focal basal and mild concentric hypertrophy of the septum. Systolic function was normal. The estimated ejection fraction was in the range of 60% to 65%. Wall motion was normal; there were no regional wall motion abnormalities. Doppler parameters are consistent with abnormal left ventricular relaxation (grade 1 diastolic dysfunction). There was no evidence of elevate ventricular filling pressure by Doppler parameters. - Aortic valve: There was no regurgitation. - Aortic root: The aortic root was normal in size. - Mitral valve: Structurally normal valve. There was no regurgitation. - Right ventricle: Systolic function was normal. - Right atrium: The atrium was normal in size. - Tricuspid valve: There was trivial regurgitation. - Pulmonary arteries: Systolic pressure was within the normal range.   01-07-15: renal ultrasound: Limited examination demonstrating no acute abnormality. Negative for Hydronephrosis.  Possible nonobstructing stone lower pole left kidney.  01-18-15: IVC filter placement: Successful IVC filter placement. This is temporary or can remain in place to become permanent.  01-28-15: ct of head: 1. No acute intracranial findings demonstrated. Mild atrophy and periventricular white matter disease. 2. Bilateral mastoid effusions with opacification of the left middle ear.  06-17-15: chest x-ray; Cardiomegaly with mild pulmonary vascular congestion  06-19-15: ct guided prostate abscess drain: Fluid collection along the right side of the prostate compatible with an abscess. 30 mL of purulent fluid was removed.  06-24-15: ct of abdomen and pelvis: Fatty infiltration of the liver. Bilateral nonobstructive nephrolithiasis. Atherosclerosis of abdominal aorta without aneurysm formation. Sigmoid diverticulosis is noted without inflammation. Interval placement of transgluteal drainage catheter into right side of the pelvis. Fluid collection noted adjacent to prostate gland on prior exam has been completely decompressed.  06-25-15: kub: Moderate ileus pattern.    LABS REVIEWED:   03-14-15: wbc 8.5; hgb 12.;1 hct 36.3; mcv 93.1; plt 251; glucose 112; bun 20; creat 0.82; k+3.0; na++135; phos 3.7; mag 1.2; albumin 3.2 03-25-15: wbc 8.5; hgb 11.7; hct 36.4; mcv 95.0; plt 240; glucose 111; bun 20; creat 1.01; k+3.4; na++138  04-01-15: glucose 112; bun 19; creat 0.97; k+3.3; na++139 04-03-15: inr 1.63 coumadin 6 mg 04-09-15: inr 2.16 coumadin 7 mg 04-15-15: inr 2.16 coumadin 7 mg 04-12-15: glucose 101; bun 20; creat 0.99; k+3.3; na++ 140 06-17-15: wbc 12.6; hgb 13.6; hct 41.0; mcv 86.1; plt 282; glucose 137; bun 24; creat 0.90; k+ 3.3; na++137; alk phos  173; albumin 3.1; blood culture: no growth 06-18-15: hgb a1c 6.4 06-20-15: wbv 9.6; hgb 11.6; hct 36.7; mvc 87.4 plt 335 06-22-15: wbc 11.7; hgb 13.1; hct 40.3; mcv 85.4; plt 420; glucose 114; bun 19; creat 0.95; k+ 3.4; na++133 06-25-15: wbc 11.4; hgb  13.1; hct 41.1; mcv 84.7; plt 461; glucose 128; bun 36; creat 1.23; k+ 3.3; na++135; mag 2.1  06-28-15: wbc 11.;5 hgb 13.5; hct 39.6; mcv 82.2; plt 462; glucose 106; bun 40; creat 1.44; k+ 3.6; na++134 07-01-15: INR 2.19 07-29-15; INR 2.68  11-11-15: inr 2.26: coumadin 6 mg  12-02-15: INR 2.56 11-17-15: liver normal albumin 3.6; chol 154; ldl 92; trig 101; hdl 42  02-10-16: wbc 9.5; hgb 13.8; hct 40.4; mcv 82.3 ;plt 322; glucose 98; bun 35; creat 1.46; k+ 2.6; na++ 132; alk phos 174; albumin 3.9; chol 155; ldl 83; trig 115; hdl 49  02-13-16: glucose 112; bun 26; creat 1.25; k+ 2.7; na++ 132  02-14-16: wbc 9.4; hgb 14.2; hct 42.1; mcv 81.6; plt 326; glucose 99; bun 27 creat 1.42; k+ 3.0; na++ 133; alk phos 17 ast 33; alt 27; albumin 3.7  02-17-16: glucose 83; bun 31; creat 1.53; k+ 3.2; na++ 136  02-20-16: glucose 93; bun 26; creat 1.59; k+ 3.8; na++ 133  02-25-16: glucose 88; bun 16; creat 1.24; k+ 5.0; na++ 138 02-27-16; glucose 101; bun 15; creat 1.16; k+ 4.5; na++ 139      Review of Systems Constitutional: Negative for appetite change and fatigue.  HENT: Negative for congestion.   Respiratory: Negative for cough, chest tightness and shortness of breath.   Cardiovascular: Negative for chest pain, palpitations and leg swelling.  Gastrointestinal: Negative for nausea, abdominal pain, diarrhea and constipation.  Musculoskeletal: Negative for myalgias and arthralgias.  Skin: Negative for pallor.  Neurological: Negative for dizziness. has bilateral hand tremors present.  Psychiatric/Behavioral: The patient is not nervous/anxious.     Physical Exam Constitutional: He is oriented to person, place, and time. No distress. is obese  Eyes: Conjunctivae are normal.  Neck: Neck supple. No JVD present. No thyromegaly present.  Cardiovascular: Normal rate, regular rhythm and intact distal pulses.   Respiratory: Effort normal and breath sounds normal. No respiratory distress. He has no wheezes.  Has  trach     GI: Soft. Bowel sounds are normal. He exhibits no distension. There is no tenderness.    Musculoskeletal: no lower extremity edema   Able to move all extremities  Bilateral lower extremities discolored from impaired circulation Has  bilateral hand tremor present; is causing him increased difficulty with eating.    Lymphadenopathy:    He has no cervical adenopathy.  Neurological: He is alert and oriented to person, place, and time.  Skin: Skin is warm and dry. He is not diaphoretic.   Psychiatric: He has a normal mood and affect.      ASSESSMENT/ PLAN:  1. gerd 2. Tremor Will lower reglan to 5 mg four times daily for 5 days; then 2.5 mg four times daily for 5 daily the twice daily for 5 days then stop Will monitor his tremor    Ok Edwards NP [facility]  Contact [phone] Monday through Friday 8am- 5pm  After hours call [phone]

## 2016-02-29 DIAGNOSIS — K219 Gastro-esophageal reflux disease without esophagitis: Secondary | ICD-10-CM | POA: Insufficient documentation

## 2016-02-29 DIAGNOSIS — R251 Tremor, unspecified: Secondary | ICD-10-CM | POA: Insufficient documentation

## 2016-03-14 ENCOUNTER — Encounter (HOSPITAL_COMMUNITY): Payer: Self-pay | Admitting: Emergency Medicine

## 2016-03-14 ENCOUNTER — Emergency Department (HOSPITAL_COMMUNITY): Payer: BLUE CROSS/BLUE SHIELD

## 2016-03-14 ENCOUNTER — Emergency Department (HOSPITAL_COMMUNITY)
Admission: EM | Admit: 2016-03-14 | Discharge: 2016-03-14 | Disposition: A | Discharge status: Home / Self Care | Payer: BLUE CROSS/BLUE SHIELD | Attending: Emergency Medicine | Admitting: Emergency Medicine

## 2016-03-14 DIAGNOSIS — Z87891 Personal history of nicotine dependence: Secondary | ICD-10-CM | POA: Insufficient documentation

## 2016-03-14 DIAGNOSIS — I110 Hypertensive heart disease with heart failure: Secondary | ICD-10-CM | POA: Insufficient documentation

## 2016-03-14 DIAGNOSIS — Z7901 Long term (current) use of anticoagulants: Secondary | ICD-10-CM | POA: Insufficient documentation

## 2016-03-14 DIAGNOSIS — J9500 Unspecified tracheostomy complication: Secondary | ICD-10-CM | POA: Diagnosis present

## 2016-03-14 DIAGNOSIS — J449 Chronic obstructive pulmonary disease, unspecified: Secondary | ICD-10-CM | POA: Insufficient documentation

## 2016-03-14 DIAGNOSIS — I509 Heart failure, unspecified: Secondary | ICD-10-CM | POA: Insufficient documentation

## 2016-03-14 DIAGNOSIS — Z79899 Other long term (current) drug therapy: Secondary | ICD-10-CM | POA: Diagnosis not present

## 2016-03-14 DIAGNOSIS — E785 Hyperlipidemia, unspecified: Secondary | ICD-10-CM | POA: Insufficient documentation

## 2016-03-14 NOTE — ED Notes (Addendum)
[facility] reporting trach out upon waking.  EMS reports SpO2 94%. Initial O2 on arrival 89%, RT at bedside. Pt AOx4, NAD noted.

## 2016-03-14 NOTE — ED Provider Notes (Signed)
CSN: [id number]     Arrival date & time 03/14/16  1103 History   First MD Initiated Contact with Patient 03/14/16 1109     Chief Complaint  Patient presents with  . Tracheostomy Tube Change     (Consider location/radiation/quality/duration/timing/severity/associated sxs/prior Treatment) HPI Comments: Patient is a 65 year old male with history of CHF, COPD, prior tracheostomy. He presents for evaluation of a dislodged trach. He woke this morning at the extended care facility with this dislodged. He denies any difficulty breathing. He denies other complaints.  The history is provided by the patient.    Past Medical History  Diagnosis Date  . Arthritis   . RCT (rotator cuff tear)   . Multiple abrasions     rt arm  . Morbid obesity ([facility])   . Hypertension   . Hyperlipidemia   . COPD (chronic obstructive pulmonary disease) ([facility])   . CHF (congestive heart failure) [facility])    Past Surgical History  Procedure Laterality Date  . No past surgeries    . Shoulder arthroscopy with subacromial decompression, rotator cuff repair and bicep tendon repair Right 12/28/2014    Procedure: RIGHT SHOULDER ARTHROSCOPY WITH SUBACROMIAL DECOMPRESSION, DISTAL CLAVICLE RESECTION, ROTATOR CUFF REPAIR ;  Surgeon: Eugenia Mcalpineobert Collins, MD;  Location: [facility];  Service: Orthopedics;  Laterality: Right;  . Tracheostomy tube placement N/A 01/16/2015    Procedure: TRACHEOSTOMY;  Surgeon: Flo ShanksKarol Wolicki, MD;  [facility];  Service: ENT;  Laterality: N/A;  . Tracheostomy revision N/A 01/18/2015    Procedure: TRACHEOSTOMY REVISION;  Surgeon: Melvenia BeamMitchell [name], MD;  [facility];  Service: ENT;  Laterality: N/A;  . Tracheostomy tube placement N/A 01/22/2015    Procedure: TRACHEOSTOMY;  Surgeon: Flo ShanksKarol Wolicki, MD;  [facility];  Service: ENT;  Laterality: N/A;  . [id number]     Family History  Problem Relation Age of Onset  . Heart disease Mother   . Heart attack Mother   . Heart disease Brother    Social History   Substance Use Topics  . Smoking status: Former Smoker -- 0.20 packs/day    Types: Cigarettes  . Smokeless tobacco: None  . Alcohol Use: No    Review of Systems  All other systems reviewed and are negative.     Allergies  Review of patient's allergies indicates no known allergies.  Home Medications   Prior to Admission medications   Medication Sig Start Date End Date Taking? Authorizing Provider  atorvastatin (LIPITOR) 20 MG tablet Take 20 mg by mouth daily.    Historical Provider, MD  clonazePAM (KLONOPIN) 0.5 MG tablet Take 0.25 mg by mouth at bedtime.     Historical Provider, MD  docusate sodium (COLACE) 100 MG capsule Take 1 capsule (100 mg total) by mouth 2 (two) times daily. 06/26/15   Dorothea OgleIskra M Myers, MD  DULoxetine (CYMBALTA) 60 MG capsule Take 60 mg by mouth daily.    Historical Provider, MD  famotidine (PEPCID) 20 MG tablet Take 20 mg by mouth daily.    Historical Provider, MD  folic acid (FOLVITE) 1 MG tablet Take 1 mg by mouth daily.    Historical Provider, MD  furosemide (LASIX) 20 MG tablet Take 20 mg by mouth daily.    Historical Provider, MD  hydrALAZINE (APRESOLINE) 25 MG tablet Take 1.5 tablets (37.5 mg total) by mouth 3 (three) times daily. Patient taking differently: Take 37.5 mg by mouth 2 (two) times daily.  02/17/16   Sharee Holstereborah S Green, NP  ipratropium-albuterol (DUONEB) 0.5-2.5 (3) MG/3ML SOLN  Take 3 mLs by nebulization every 6 (six) hours as needed. For shortness of breath    Historical Provider, MD  isosorbide mononitrate (ISMO,MONOKET) 20 MG tablet Take 20 mg by mouth 2 (two) times daily at 10 AM and 5 PM.    Historical Provider, MD  lidocaine (LIDODERM) 5 % Place 1 patch onto the skin daily. Remove & Discard patch within 12 hours or as directed by MD To right shoulder    Historical Provider, MD  metoCLOPramide (REGLAN) 10 MG tablet Take 10 mg by mouth 4 (four) times daily -  before meals and at bedtime.    Historical Provider, MD  Omega-3 Fatty Acids (FISH  OIL) 1000 MG CAPS Take 1,000 mg by mouth daily.     Historical Provider, MD  ondansetron (ZOFRAN) 4 MG tablet Take 4 mg by mouth every 6 (six) hours.    Historical Provider, MD  polyethylene glycol (MIRALAX / GLYCOLAX) packet Take 17 g by mouth daily. 06/26/15   Dorothea Ogle, MD  Potassium (POTASSIMIN PO) Take 40 mEq by mouth 4 (four) times daily.    Historical Provider, MD  triamterene (DYRENIUM) 50 MG capsule Take 1 capsule (50 mg total) by mouth daily. 02/17/16   Sharee Holster, NP  warfarin (COUMADIN) 6 MG tablet Take 6.5 mg by mouth daily at 6 PM.     Historical Provider, MD   BP 124/84 mmHg  Pulse 63  Temp(Src) 98 F (36.7 C) (Oral)  Resp 18  SpO2 89% Physical Exam  Constitutional: He is oriented to person, place, and time.  Patient is a chronically ill appearing 65 year old male. He is in no acute distress.  HENT:  Head: Normocephalic and atraumatic.  The neck is noted have a tracheostomy site. There is slight bleeding, however it appears patent and there is good air movement.  Neck: Normal range of motion. Neck supple.  Cardiovascular: Normal rate, regular rhythm and normal heart sounds.   No murmur heard. Pulmonary/Chest: Effort normal and breath sounds normal. No respiratory distress. He has no wheezes. He has no rales.  Abdominal: Soft. Bowel sounds are normal.  Neurological: He is alert and oriented to person, place, and time.  Skin: Skin is warm and dry.  Nursing note and vitals reviewed.   ED Course  Procedures (including critical care time) Labs Review Labs Reviewed - No data to display  Imaging Review No results found. I have personally reviewed and evaluated these images and lab results as part of my medical decision-making.   MDM   Final diagnoses:  None    The tracheostomy tube was replaced with a new trach by respiratory. He is now breathing comfortably and chest x-ray appears to show adequate placement. I feel as though he is appropriate for  discharge.    Geoffery Lyons, MD 03/14/16 [phone]

## 2016-03-14 NOTE — Progress Notes (Signed)
RT late entry: RT called to patient room for trach chance. [name] was not in place and patient desating but not in distress. Nonrebreather applied sats increased to 100%. RT replaced patients trach and placed the correct inner canula in trach. Patient had a deposable inner canula in the trach instead of correct non despoable.Patients sight under trach flange was reddened  with no drain sponge in place. RT placed a drain sponge and ensured that trach ties were secured properly so trach would not become dislodged again. RT placed patient on 28% ATC and suctioned moderated white bloody secretions. Patients vital signs stable at this time.

## 2016-03-14 NOTE — ED Notes (Signed)
Per Beverely LowJeannie, care manager, Rt's concerns re trach care should be followed up by social work. Consult to social work placed by this Charity fundraiserN.

## 2016-03-14 NOTE — ED Notes (Signed)
RT spoke at length with RN at nursing home RE improving care for trach.  Cannula equip sent home with pt per RT.

## 2016-03-14 NOTE — ED Notes (Signed)
Materials Management paged and responded for 4 uncuffed shiley.  A11 tubed.

## 2016-03-14 NOTE — ED Notes (Signed)
Pt breathing without difficulty.  Notified of plan of care.

## 2016-03-14 NOTE — ED Notes (Signed)
[facility] called to transport [facility] to [facility]

## 2016-03-16 ENCOUNTER — Non-Acute Institutional Stay: Payer: BLUE CROSS/BLUE SHIELD | Admitting: Adult Health

## 2016-03-16 ENCOUNTER — Encounter: Payer: Self-pay | Admitting: Adult Health

## 2016-03-16 DIAGNOSIS — I5032 Chronic diastolic (congestive) heart failure: Secondary | ICD-10-CM | POA: Diagnosis not present

## 2016-03-16 DIAGNOSIS — E876 Hypokalemia: Secondary | ICD-10-CM | POA: Diagnosis not present

## 2016-03-16 DIAGNOSIS — E785 Hyperlipidemia, unspecified: Secondary | ICD-10-CM

## 2016-03-16 DIAGNOSIS — F418 Other specified anxiety disorders: Secondary | ICD-10-CM | POA: Diagnosis not present

## 2016-03-16 DIAGNOSIS — E662 Morbid (severe) obesity with alveolar hypoventilation: Secondary | ICD-10-CM

## 2016-03-16 DIAGNOSIS — I82501 Chronic embolism and thrombosis of unspecified deep veins of right lower extremity: Secondary | ICD-10-CM | POA: Diagnosis not present

## 2016-03-16 DIAGNOSIS — Z95828 Presence of other vascular implants and grafts: Secondary | ICD-10-CM | POA: Diagnosis not present

## 2016-03-16 DIAGNOSIS — I110 Hypertensive heart disease with heart failure: Secondary | ICD-10-CM | POA: Diagnosis not present

## 2016-03-16 DIAGNOSIS — J9611 Chronic respiratory failure with hypoxia: Secondary | ICD-10-CM | POA: Diagnosis not present

## 2016-03-16 MED ORDER — ONDANSETRON HCL 4 MG PO TABS: 4.0000 mg | ORAL_TABLET | Freq: Four times a day (QID) | ORAL | Status: DC | PRN

## 2016-03-16 NOTE — Progress Notes (Signed)
Patient ID: [name], male   DOB: 1951/02/08, 65 y.o.   MRN: [id number]   Location:   [place] Room Number: 141-A Place of Service:  SNF (31)   CODE STATUS: Full Code  No Known Allergies  Chief Complaint  Patient presents with  . Medical Management of Chronic Issues    Follow up    HPI:  He is a long term resident of this facility being seen for the management of his chronic illnesses. Overall his status is stable at this time. He has required numerous medication changes over the past month related to his diastolic heart failure and hypokalemia. His weight is stable at 224 pounds. He is not voicing any concerns at this time. His tremor is improving with stopping the reglan. There are no nursing concerns at this time.   Past Medical History  Diagnosis Date  . Arthritis   . RCT (rotator cuff tear)   . Multiple abrasions     rt arm  . Morbid obesity ([place])   . Hypertension   . Hyperlipidemia   . COPD (chronic obstructive pulmonary disease) ([place])   . CHF (congestive heart failure) [facility])     Past Surgical History  Procedure Laterality Date  . No past surgeries    . Shoulder arthroscopy with subacromial decompression, rotator cuff repair and bicep tendon repair Right 12/28/2014    Procedure: RIGHT SHOULDER ARTHROSCOPY WITH SUBACROMIAL DECOMPRESSION, DISTAL CLAVICLE RESECTION, ROTATOR CUFF REPAIR ;  Surgeon: Sydnee Cabal, MD;  Location: [facility];  Service: Orthopedics;  Laterality: Right;  . Tracheostomy tube placement N/A 01/16/2015    Procedure: TRACHEOSTOMY;  Surgeon: Jodi Marble, MD;  Location: [place];  Service: ENT;  Laterality: N/A;  . Tracheostomy revision N/A 01/18/2015    Procedure: TRACHEOSTOMY REVISION;  Surgeon: Ruby Cola, MD;  Location: [place];  Service: ENT;  Laterality: N/A;  . Tracheostomy tube placement N/A 01/22/2015    Procedure: TRACHEOSTOMY;  Surgeon: Jodi Marble, MD;  Location: [place];  Service: ENT;  Laterality: N/A;  . [id number]       Social History   Social History  . Marital Status: Single    Spouse Name: N/A  . Number of Children: N/A  . Years of Education: N/A   Occupational History  . Not on file.   Social History Main Topics  . Smoking status: Former Smoker -- 0.20 packs/day    Types: Cigarettes  . Smokeless tobacco: Not on file  . Alcohol Use: No  . Drug Use: No  . Sexual Activity: Not on file   Other Topics Concern  . Not on file   Social History Narrative   Family History  Problem Relation Age of Onset  . Heart disease Mother   . Heart attack Mother   . Heart disease Brother       VITAL SIGNS BP 122/93 mmHg  Pulse 75  Temp(Src) 97.9 F (36.6 C) (Oral)  Resp 18  Ht 6' (1.829 m)  Wt 224 lb 4 oz (101.719 kg)  BMI 30.41 kg/m2  SpO2 95%  Patient's Medications  New Prescriptions   No medications on file  Previous Medications   ATORVASTATIN (LIPITOR) 20 MG TABLET    Take 20 mg by mouth daily.   CLONAZEPAM (KLONOPIN) 0.5 MG TABLET    Take 0.25 mg by mouth at bedtime.    DOCUSATE SODIUM (COLACE) 100 MG CAPSULE    Take 1 capsule (100 mg total) by mouth 2 (two) times daily.  DULOXETINE (CYMBALTA) 60 MG CAPSULE    Take 60 mg by mouth daily.   FAMOTIDINE (PEPCID) 20 MG TABLET    Take 20 mg by mouth daily.   FOLIC ACID (FOLVITE) 1 MG TABLET    Take 1 mg by mouth daily.   FUROSEMIDE (LASIX) 20 MG TABLET    Take 20 mg by mouth daily.   HYDRALAZINE (APRESOLINE) 25 MG TABLET    Take 1.5 tablets (37.5 mg total) by mouth 3 (three) times daily.   IPRATROPIUM-ALBUTEROL (DUONEB) 0.5-2.5 (3) MG/3ML SOLN    Take 3 mLs by nebulization every 6 (six) hours as needed. For shortness of breath   ISOSORBIDE MONONITRATE (ISMO,MONOKET) 20 MG TABLET    Take 20 mg by mouth 2 (two) times daily at 10 AM and 5 PM.   LIDOCAINE (LIDODERM) 5 %    Place 1 patch onto the skin daily. Remove & Discard patch within 12 hours or as directed by MD To right shoulder   OMEGA-3 FATTY ACIDS (FISH OIL) 1000 MG CAPS    Take  1,000 mg by mouth daily.    ONDANSETRON (ZOFRAN) 4 MG TABLET    Take 4 mg by mouth every 6 (six) hours.   POLYETHYLENE GLYCOL (MIRALAX / GLYCOLAX) PACKET    Take 17 g by mouth daily.   POTASSIUM (POTASSIMIN PO)    Take 40 mEq by mouth 4 (four) times daily.   TRIAMTERENE (DYRENIUM) 50 MG CAPSULE    Take 1 capsule (50 mg total) by mouth daily.   WARFARIN (COUMADIN) 6 MG TABLET    Take 6.5 mg by mouth daily at 6 PM.   Modified Medications   No medications on file  Discontinued Medications   METOCLOPRAMIDE (REGLAN) 10 MG TABLET    Take 10 mg by mouth 4 (four) times daily -  before meals and at bedtime. Reported on 03/16/2016     SIGNIFICANT DIAGNOSTIC EXAMS  12-29-14: testicle ultrasound: Two solid extratesticular right scrotal masses. Primary differential diagnosis for extratesticular masses include primary or metastatic neoplasms including adenomatoid tumor, fibroma, leiomyoma, mesothelioma, or sarcoma. Urology consultation is recommended. No sonographic evidence for testicular torsion or intratesticular mass.  01-01-15: 2-d echo: Left ventricle: The cavity size was normal. There was mild focal basal and mild concentric hypertrophy of the septum. Systolic function was normal. The estimated ejection fraction was in the range of 60% to 65%. Wall motion was normal; there were no regional wall motion abnormalities. Doppler parameters are consistent with abnormal left ventricular relaxation (grade 1 diastolic dysfunction). There was no evidence of elevate ventricular filling pressure by Doppler parameters. - Aortic valve: There was no regurgitation. - Aortic root: The aortic root was normal in size. - Mitral valve: Structurally normal valve. There was no regurgitation. - Right ventricle: Systolic function was normal. - Right atrium: The atrium was normal in size. - Tricuspid valve: There was trivial regurgitation. - Pulmonary arteries: Systolic pressure was within the normal range.   01-07-15: renal  ultrasound: Limited examination demonstrating no acute abnormality. Negative for Hydronephrosis. Possible nonobstructing stone lower pole left kidney.  01-18-15: IVC filter placement: Successful IVC filter placement. This is temporary or can remain in place to become permanent.  01-28-15: ct of head: 1. No acute intracranial findings demonstrated. Mild atrophy and periventricular white matter disease. 2. Bilateral mastoid effusions with opacification of the left middle ear.  06-17-15: chest x-ray; Cardiomegaly with mild pulmonary vascular congestion  06-19-15: ct guided prostate abscess drain: Fluid collection along the right side  of the prostate compatible with an abscess. 30 mL of purulent fluid was removed.  06-24-15: ct of abdomen and pelvis: Fatty infiltration of the liver. Bilateral nonobstructive nephrolithiasis. Atherosclerosis of abdominal aorta without aneurysm formation. Sigmoid diverticulosis is noted without inflammation. Interval placement of transgluteal drainage catheter into right side of the pelvis. Fluid collection noted adjacent to prostate gland on prior exam has been completely decompressed.  06-25-15: kub: Moderate ileus pattern.    LABS REVIEWED:   03-14-15: wbc 8.5; hgb 12.;1 hct 36.3; mcv 93.1; plt 251; glucose 112; bun 20; creat 0.82; k+3.0; na++135; phos 3.7; mag 1.2; albumin 3.2 03-25-15: wbc 8.5; hgb 11.7; hct 36.4; mcv 95.0; plt 240; glucose 111; bun 20; creat 1.01; k+3.4; na++138  04-01-15: glucose 112; bun 19; creat 0.97; k+3.3; na++139 04-03-15: inr 1.63 coumadin 6 mg 04-09-15: inr 2.16 coumadin 7 mg 04-15-15: inr 2.16 coumadin 7 mg 04-12-15: glucose 101; bun 20; creat 0.99; k+3.3; na++ 140 06-17-15: wbc 12.6; hgb 13.6; hct 41.0; mcv 86.1; plt 282; glucose 137; bun 24; creat 0.90; k+ 3.3; na++137; alk phos  173; albumin 3.1; blood culture: no growth 06-18-15: hgb a1c 6.4 06-20-15: wbv 9.6; hgb 11.6; hct 36.7; mvc 87.4 plt 335 06-22-15: wbc 11.7; hgb 13.1; hct  40.3; mcv 85.4; plt 420; glucose 114; bun 19; creat 0.95; k+ 3.4; na++133 06-25-15: wbc 11.4; hgb 13.1; hct 41.1; mcv 84.7; plt 461; glucose 128; bun 36; creat 1.23; k+ 3.3; na++135; mag 2.1  06-28-15: wbc 11.;5 hgb 13.5; hct 39.6; mcv 82.2; plt 462; glucose 106; bun 40; creat 1.44; k+ 3.6; na++134 07-01-15: INR 2.19 07-29-15; INR 2.68  11-11-15: inr 2.26: coumadin 6 mg  12-02-15: INR 2.56 11-17-15: liver normal albumin 3.6; chol 154; ldl 92; trig 101; hdl 42  02-10-16: wbc 9.5; hgb 13.8; hct 40.4; mcv 82.3 ;plt 322; glucose 98; bun 35; creat 1.46; k+ 2.6; na++ 132; alk phos 174; albumin 3.9; chol 155; ldl 83; trig 115; hdl 49  02-13-16: glucose 112; bun 26; creat 1.25; k+ 2.7; na++ 132  02-14-16: wbc 9.4; hgb 14.2; hct 42.1; mcv 81.6; plt 326; glucose 99; bun 27 creat 1.42; k+ 3.0; na++ 133; alk phos 17 ast 33; alt 27; albumin 3.7  02-17-16: glucose 83; bun 31; creat 1.53; k+ 3.2; na++ 136  02-19-16: urine osmolality: 351;  02-20-16: glucose 93; bun 26; creat 1.59; k+ 3.8; na++ 133  02-25-16: glucose 88; bun 16; creat 1.24; k+ 5.0; na++ 138 02-27-16; glucose 101; bun 15; creat 1.16; k+ 4.5; na++ 139  03-02-16: INR 2.9: coumadin 6.5 mg daily     Review of Systems Constitutional: Negative for appetite change and fatigue.  HENT: Negative for congestion.   Respiratory: Negative for cough, chest tightness and shortness of breath.   Cardiovascular: Negative for chest pain, palpitations and leg swelling.  Gastrointestinal: Negative for nausea, abdominal pain, diarrhea and constipation.  Musculoskeletal: Negative for myalgias and arthralgias.  Skin: Negative for pallor.  Neurological: Negative for dizziness. has bilateral hand tremors present.  Psychiatric/Behavioral: The patient is not nervous/anxious.     Physical Exam Constitutional: He is oriented to person, place, and time. No distress. is obese  Eyes: Conjunctivae are normal.  Neck: Neck supple. No JVD present. No thyromegaly present.    Cardiovascular: Normal rate, regular rhythm and intact distal pulses.   Respiratory: Effort normal and breath sounds normal. No respiratory distress. He has no wheezes.  Has  trach   GI: Soft. Bowel sounds are normal. He exhibits no distension. There  is no tenderness.    Musculoskeletal: no lower extremity edema   Able to move all extremities  Bilateral lower extremities discolored from impaired circulation Has bilateral hand tremor present; is less Lymphadenopathy:    He has no cervical adenopathy.  Neurological: He is alert and oriented to person, place, and time.  Skin: Skin is warm and dry. He is not diaphoretic.   Psychiatric: He has a normal mood and affect.      ASSESSMENT/ PLAN:   1. Chronic pain: his pain is presently being managed with lidoderm patch to right shoulder; will not make changes will monitor his status   2.  chronic respiratory failure; obesity hypoventilation syndrome: has duoneb every 6 hours as needed;   is presenlty stable is trach for structure support. His trach is slowly being reduced. Will monitor    3. Diastolic heart failure: will continue lasix 20 mg daily; dyrenium 50 mg daily  his zaroxolyn was stopped due to his renal function.   4. Hypokalemia: will continue  k+  40 meq 4 times daily    5. Hypertension: will continue hydralazine 37.5 mg three times  daily; isordil 20 mg twice daily   6. Jerrye Bushy: will continue pepcid 20 mg daily will change the zofran to 4 mg every 6 hours as needed; his reglan as stopped due to tremors; will monitor   7. Constipation: will continue miralax daily colace twice daily   8. Depression with anxiety: will continue cymbalta 60 mg  daily; take klonopin 0.25 mg nightly  for anxiety  9.  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 Dyslipidemia: will continue lipitor 20 mg daily and fish oil 1 gm daily ldl 83; trig [place] NP [facility]  Contact [phone] Monday through Friday 8am- 5pm  After hours call [phone]

## 2016-03-23 LAB — POCT INR: INR: 1.8 — AB (ref 0.9–1.1)

## 2016-03-23 LAB — PROTIME-INR: PROTIME: 18.9 s — AB (ref 10.0–13.8)

## 2016-03-31 ENCOUNTER — Emergency Department (HOSPITAL_COMMUNITY)
Admission: EM | Admit: 2016-03-31 | Discharge: 2016-03-31 | Disposition: A | Discharge status: Home / Self Care | Payer: BLUE CROSS/BLUE SHIELD | Attending: Emergency Medicine | Admitting: Emergency Medicine

## 2016-03-31 ENCOUNTER — Encounter (HOSPITAL_COMMUNITY): Payer: Self-pay | Admitting: Emergency Medicine

## 2016-03-31 DIAGNOSIS — Z7901 Long term (current) use of anticoagulants: Secondary | ICD-10-CM | POA: Insufficient documentation

## 2016-03-31 DIAGNOSIS — Z87891 Personal history of nicotine dependence: Secondary | ICD-10-CM | POA: Insufficient documentation

## 2016-03-31 DIAGNOSIS — I5032 Chronic diastolic (congestive) heart failure: Secondary | ICD-10-CM | POA: Diagnosis not present

## 2016-03-31 DIAGNOSIS — J9500 Unspecified tracheostomy complication: Secondary | ICD-10-CM

## 2016-03-31 DIAGNOSIS — N182 Chronic kidney disease, stage 2 (mild): Secondary | ICD-10-CM | POA: Insufficient documentation

## 2016-03-31 DIAGNOSIS — J449 Chronic obstructive pulmonary disease, unspecified: Secondary | ICD-10-CM | POA: Insufficient documentation

## 2016-03-31 DIAGNOSIS — J9509 Other tracheostomy complication: Secondary | ICD-10-CM | POA: Insufficient documentation

## 2016-03-31 DIAGNOSIS — I130 Hypertensive heart and chronic kidney disease with heart failure and stage 1 through stage 4 chronic kidney disease, or unspecified chronic kidney disease: Secondary | ICD-10-CM | POA: Diagnosis not present

## 2016-03-31 NOTE — ED Notes (Signed)
MD unable to replace trach, patient maintaining his airway, respiratory e/u, O2 Sat 99% on room air.

## 2016-03-31 NOTE — ED Triage Notes (Signed)
Pt arrives via [facility] from [place] with c/o his trach coming out last night. Pt a/o, resp e/u, nad.

## 2016-03-31 NOTE — ED Provider Notes (Signed)
[facility]-EMERGENCY DEPT Provider Note   CSN: [id number] Arrival date & time: 03/31/16  1049  First Provider Contact:  None       History   Chief Complaint Chief Complaint  Patient presents with  . Tracheostomy Tube Change    HPI [name] is a 65 y.o. male.  HPI Patient has a history of chronic tracheostomy which she reports became dislodged last night when he was coughing.  It has not been in place since then.  He has a 40 uncuffed trach.  This is managed and monitored by Dr. Lazarus Salines per the patient.  No fevers or chills.  No other complaint.  Requests tracheostomy be replaced    Past Medical History:  Diagnosis Date  . Arthritis   . CHF (congestive heart failure) ([facility])   . COPD (chronic obstructive pulmonary disease) ([facility])   . Hyperlipidemia   . Hypertension   . Morbid obesity ([facility])   . Multiple abrasions    rt arm  . RCT (rotator cuff tear)     Patient Active Problem List   Diagnosis Date Noted  . Tremor of both hands 02/29/2016  . GERD without esophagitis 02/29/2016  . Chronic renal failure, stage 2 (mild) 02/17/2016  . Presence of IVC filter 01/06/2016  . Hypertensive heart disease with CHF (congestive heart failure) ([facility]) 12/27/2015  . Dyslipidemia 11/10/2015  . Depression with anxiety 09/02/2015  . Chronic diastolic congestive heart failure ([facility]) 04/22/2015  . Hypokalemia 04/22/2015  . Chronic respiratory failure with hypoxia ([facility])   . Tracheostomy status ([facility])   . Obesity hypoventilation syndrome ([facility]) 01/20/2015  . Chronic deep vein thrombosis (DVT) ([facility])   . BMI 50.0-59.9, adult ([facility]) 11/29/2014    Past Surgical History:  Procedure Laterality Date  . 40981    . NO PAST SURGERIES    . SHOULDER ARTHROSCOPY WITH SUBACROMIAL DECOMPRESSION, ROTATOR CUFF REPAIR AND BICEP TENDON REPAIR Right 12/28/2014   Procedure: RIGHT SHOULDER ARTHROSCOPY WITH SUBACROMIAL DECOMPRESSION, DISTAL CLAVICLE RESECTION, ROTATOR CUFF REPAIR ;  Surgeon: Eugenia Mcalpine, MD;   Location: [facility];  Service: Orthopedics;  Laterality: Right;  . TRACHEOSTOMY REVISION N/A 01/18/2015   Procedure: TRACHEOSTOMY REVISION;  Surgeon: Melvenia Beam, MD;  Location: [facility] OR;  Service: ENT;  Laterality: N/A;  . TRACHEOSTOMY TUBE PLACEMENT N/A 01/16/2015   Procedure: TRACHEOSTOMY;  Surgeon: Flo Shanks, MD;  Location: [facility] OR;  Service: ENT;  Laterality: N/A;  . TRACHEOSTOMY TUBE PLACEMENT N/A 01/22/2015   Procedure: TRACHEOSTOMY;  Surgeon: Flo Shanks, MD;  Location: [facility] OR;  Service: ENT;  Laterality: N/A;       Home Medications    Prior to Admission medications   Medication Sig Start Date End Date Taking? Authorizing Provider  atorvastatin (LIPITOR) 20 MG tablet Take 20 mg by mouth daily.    Historical Provider, MD  clonazePAM (KLONOPIN) 0.5 MG tablet Take 0.25 mg by mouth at bedtime.     Historical Provider, MD  docusate sodium (COLACE) 100 MG capsule Take 1 capsule (100 mg total) by mouth 2 (two) times daily. 06/26/15   Dorothea Ogle, MD  DULoxetine (CYMBALTA) 60 MG capsule Take 60 mg by mouth daily.    Historical Provider, MD  famotidine (PEPCID) 20 MG tablet Take 20 mg by mouth daily.    Historical Provider, MD  folic acid (FOLVITE) 1 MG tablet Take 1 mg by mouth daily.    Historical Provider, MD  furosemide (LASIX) 20 MG tablet Take 20 mg by mouth daily.    Historical Provider,  MD  hydrALAZINE (APRESOLINE) 25 MG tablet Take 1.5 tablets (37.5 mg total) by mouth 3 (three) times daily. Patient taking differently: Take 37.5 mg by mouth 2 (two) times daily.  02/17/16   Sharee Holster, NP  ipratropium-albuterol (DUONEB) 0.5-2.5 (3) MG/3ML SOLN Take 3 mLs by nebulization every 6 (six) hours as needed. For shortness of breath    Historical Provider, MD  isosorbide mononitrate (ISMO,MONOKET) 20 MG tablet Take 20 mg by mouth 2 (two) times daily at 10 AM and 5 PM.    Historical Provider, MD  lidocaine (LIDODERM) 5 % Place 1 patch onto the skin daily. Remove & Discard patch within 12  hours or as directed by MD To right shoulder    Historical Provider, MD  Omega-3 Fatty Acids (FISH OIL) 1000 MG CAPS Take 1,000 mg by mouth daily.     Historical Provider, MD  ondansetron (ZOFRAN) 4 MG tablet Take 1 tablet (4 mg total) by mouth every 6 (six) hours as needed for nausea or vomiting. 03/16/16   Sharee Holster, NP  polyethylene glycol (MIRALAX / GLYCOLAX) packet Take 17 g by mouth daily. 06/26/15   Dorothea Ogle, MD  Potassium (POTASSIMIN PO) Take 40 mEq by mouth 4 (four) times daily.    Historical Provider, MD  triamterene (DYRENIUM) 50 MG capsule Take 1 capsule (50 mg total) by mouth daily. 02/17/16   Sharee Holster, NP  warfarin (COUMADIN) 6 MG tablet Take 6.5 mg by mouth daily at 6 PM.     Historical Provider, MD    Family History Family History  Problem Relation Age of Onset  . Heart disease Mother   . Heart attack Mother   . Heart disease Brother     Social History Social History  Substance Use Topics  . Smoking status: Former Smoker    Packs/day: 0.20    Types: Cigarettes  . Smokeless tobacco: Not on file  . Alcohol use No     Allergies   Review of patient's allergies indicates no known allergies.   Review of Systems Review of Systems  All other systems reviewed and are negative.    Physical Exam Updated Vital Signs BP 117/92   Pulse 61   Temp 98 F (36.7 C) (Oral)   Resp 18   SpO2 100%   Physical Exam  Constitutional: He is oriented to person, place, and time. He appears well-developed and well-nourished.  HENT:  Head: Normocephalic and atraumatic.  Eyes: EOM are normal.  Neck: Neck supple.  Ostomy in the neck is visible without active bleeding.  Cardiovascular: Normal rate and regular rhythm.   Pulmonary/Chest: Effort normal. No respiratory distress.  Abdominal: He exhibits no distension. There is no tenderness.  Neurological: He is alert and oriented to person, place, and time.  Skin: Skin is warm and dry.  Psychiatric: He has a  normal mood and affect. Judgment normal.  Nursing note and vitals reviewed.    ED Treatments / Results  Labs (all labs ordered are listed, but only abnormal results are displayed) Labs Reviewed - No data to display  EKG  EKG Interpretation None       Radiology No results found.  Procedures Procedures (including critical care time)  Medications Ordered in ED Medications - No data to display   Initial Impression / Assessment and Plan / ED Course  I have reviewed the triage vital signs and the nursing notes.  Pertinent labs & imaging results that were available during my care of  the patient were reviewed by me and considered in my medical decision making (see chart for details).  Clinical Course    Unfortunately I was unable to pass the 4-0 uncuffed tracheostomy tube.  I was able to pass a 5-0 endotracheal tube without difficulty.  Without his trach in place he is breathing well.  I spoke with Dr. Lazarus Salines who will see the patient and the office.  Patient be transported there via [facility] ambulance with a new 4-0 uncuffed trach  Final Clinical Impressions(s) / ED Diagnoses   Final diagnoses:  None    New Prescriptions New Prescriptions   No medications on file     Azalia Bilis, MD 03/31/16 1207

## 2016-03-31 NOTE — ED Notes (Signed)
Paged Dr. Wolicki to [phone] 

## 2016-03-31 NOTE — ED Notes (Signed)
[facility] at bedside to transport patient to dr [facility] office

## 2016-03-31 NOTE — ED Notes (Signed)
Dr. Patria Mane and [facility] at bedside to replace trach

## 2016-03-31 NOTE — ED Notes (Signed)
Respiratory and Dr. Patria Mane at bedside

## 2016-04-02 ENCOUNTER — Encounter: Payer: Self-pay | Admitting: Adult Health

## 2016-04-02 ENCOUNTER — Non-Acute Institutional Stay: Payer: BLUE CROSS/BLUE SHIELD | Admitting: Adult Health

## 2016-04-02 ENCOUNTER — Ambulatory Visit (HOSPITAL_BASED_OUTPATIENT_CLINIC_OR_DEPARTMENT_OTHER): Payer: BLUE CROSS/BLUE SHIELD | Attending: Otolaryngology | Admitting: Internal Medicine

## 2016-04-02 VITALS — Ht 72.0 in | Wt 224.0 lb

## 2016-04-02 DIAGNOSIS — Z95828 Presence of other vascular implants and grafts: Secondary | ICD-10-CM | POA: Diagnosis not present

## 2016-04-02 DIAGNOSIS — Z930 Tracheostomy status: Secondary | ICD-10-CM | POA: Insufficient documentation

## 2016-04-02 DIAGNOSIS — R0683 Snoring: Secondary | ICD-10-CM | POA: Insufficient documentation

## 2016-04-02 DIAGNOSIS — Z7901 Long term (current) use of anticoagulants: Secondary | ICD-10-CM | POA: Diagnosis not present

## 2016-04-02 DIAGNOSIS — I82501 Chronic embolism and thrombosis of unspecified deep veins of right lower extremity: Secondary | ICD-10-CM | POA: Diagnosis not present

## 2016-04-02 DIAGNOSIS — G4733 Obstructive sleep apnea (adult) (pediatric): Secondary | ICD-10-CM | POA: Diagnosis not present

## 2016-04-02 DIAGNOSIS — G4737 Central sleep apnea in conditions classified elsewhere: Secondary | ICD-10-CM | POA: Insufficient documentation

## 2016-04-02 DIAGNOSIS — Z5181 Encounter for therapeutic drug level monitoring: Secondary | ICD-10-CM

## 2016-04-02 NOTE — Progress Notes (Signed)
Patient ID: [name], male   DOB: 1951/07/23, 65 y.o.   MRN: [id number]    Location:   [facility] Room Number: 141-A Place of Service:  SNF (31)   CODE STATUS: Full Code  No Known Allergies  Chief Complaint  Patient presents with  . Acute Visit    Anticoagulation    HPI:  He is on long term anticoagulation therapy. His inr has been variable. He and I have discussed his treatment. We have decided to begin him on xarelto and to stop his coumadin in order to provide better anticoagulation management. He does understand the benefits and risks of this medications.   Past Medical History:  Diagnosis Date  . Arthritis   . CHF (congestive heart failure) ([place])   . COPD (chronic obstructive pulmonary disease) ([place])   . Hyperlipidemia   . Hypertension   . Morbid obesity ([place])   . Multiple abrasions    rt arm  . RCT (rotator cuff tear)     Past Surgical History:  Procedure Laterality Date  . 25819    . NO PAST SURGERIES    . SHOULDER ARTHROSCOPY WITH SUBACROMIAL DECOMPRESSION, ROTATOR CUFF REPAIR AND BICEP TENDON REPAIR Right 12/28/2014   Procedure: RIGHT SHOULDER ARTHROSCOPY WITH SUBACROMIAL DECOMPRESSION, DISTAL CLAVICLE RESECTION, ROTATOR CUFF REPAIR ;  Surgeon: Sydnee Cabal, MD;  Location: [facility];  Service: Orthopedics;  Laterality: Right;  . TRACHEOSTOMY REVISION N/A 01/18/2015   Procedure: TRACHEOSTOMY REVISION;  Surgeon: Ruby Cola, MD;  Location: [facility] OR;  Service: ENT;  Laterality: N/A;  . TRACHEOSTOMY TUBE PLACEMENT N/A 01/16/2015   Procedure: TRACHEOSTOMY;  Surgeon: Jodi Marble, MD;  Location: [facility];  Service: ENT;  Laterality: N/A;  . TRACHEOSTOMY TUBE PLACEMENT N/A 01/22/2015   Procedure: TRACHEOSTOMY;  Surgeon: Jodi Marble, MD;  Location: [facility];  Service: ENT;  Laterality: N/A;    Social History   Social History  . Marital status: Single    Spouse name: N/A  . Number of children: N/A  . Years of education: N/A   Occupational History  .  Not on file.   Social History Main Topics  . Smoking status: Former Smoker    Packs/day: 0.20    Types: Cigarettes  . Smokeless tobacco: Not on file  . Alcohol use No  . Drug use: No  . Sexual activity: Not on file   Other Topics Concern  . Not on file   Social History Narrative  . No narrative on file   Family History  Problem Relation Age of Onset  . Heart disease Mother   . Heart attack Mother   . Heart disease Brother       VITAL SIGNS BP 138/77   Pulse 66   Temp 97.5 F (36.4 C) (Oral)   Resp 16   Ht 6' (1.829 m)   Wt 225 lb (102.1 kg)   SpO2 96%   BMI 30.52 kg/m   Patient's Medications  New Prescriptions   No medications on file  Previous Medications   ATORVASTATIN (LIPITOR) 20 MG TABLET    Take 20 mg by mouth daily.   CLONAZEPAM (KLONOPIN) 0.5 MG TABLET    Take 0.25 mg by mouth at bedtime.    DOCUSATE SODIUM (COLACE) 100 MG CAPSULE    Take 1 capsule (100 mg total) by mouth 2 (two) times daily.   DULOXETINE (CYMBALTA) 60 MG CAPSULE    Take 60 mg by mouth daily.   FAMOTIDINE (PEPCID) 20 MG TABLET  Take 20 mg by mouth daily.   FOLIC ACID (FOLVITE) 1 MG TABLET    Take 1 mg by mouth daily.   FUROSEMIDE (LASIX) 20 MG TABLET    Take 20 mg by mouth daily.   HYDRALAZINE (APRESOLINE) 25 MG TABLET    Take 1.5 tablets (37.5 mg total) by mouth 3 (three) times daily.   IPRATROPIUM-ALBUTEROL (DUONEB) 0.5-2.5 (3) MG/3ML SOLN    Take 3 mLs by nebulization every 6 (six) hours as needed. For shortness of breath   ISOSORBIDE MONONITRATE (ISMO,MONOKET) 20 MG TABLET    Take 20 mg by mouth 2 (two) times daily at 10 AM and 5 PM.   LIDOCAINE (LIDODERM) 5 %    Place 1 patch onto the skin daily. Remove & Discard patch within 12 hours or as directed by MD To right shoulder   OMEGA-3 FATTY ACIDS (FISH OIL) 1000 MG CAPS    Take 1,000 mg by mouth daily.    ONDANSETRON (ZOFRAN) 4 MG TABLET    Take 1 tablet (4 mg total) by mouth every 6 (six) hours as needed for nausea or vomiting.    POLYETHYLENE GLYCOL (MIRALAX / GLYCOLAX) PACKET    Take 17 g by mouth daily.   POTASSIUM (POTASSIMIN PO)    Take 40 mEq by mouth 3 (three) times daily.    TRIAMTERENE (DYRENIUM) 50 MG CAPSULE    Take 1 capsule (50 mg total) by mouth daily.   WARFARIN (COUMADIN) 6 MG TABLET    Take 6.5 mg by mouth daily at 6 PM.   Modified Medications   No medications on file  Discontinued Medications   No medications on file     SIGNIFICANT DIAGNOSTIC EXAMS  12-29-14: testicle ultrasound: Two solid extratesticular right scrotal masses. Primary differential diagnosis for extratesticular masses include primary or metastatic neoplasms including adenomatoid tumor, fibroma, leiomyoma, mesothelioma, or sarcoma. Urology consultation is recommended. No sonographic evidence for testicular torsion or intratesticular mass.  01-01-15: 2-d echo: Left ventricle: The cavity size was normal. There was mild focal basal and mild concentric hypertrophy of the septum. Systolic function was normal. The estimated ejection fraction was in the range of 60% to 65%. Wall motion was normal; there were no regional wall motion abnormalities. Doppler parameters are consistent with abnormal left ventricular relaxation (grade 1 diastolic dysfunction). There was no evidence of elevate ventricular filling pressure by Doppler parameters. - Aortic valve: There was no regurgitation. - Aortic root: The aortic root was normal in size. - Mitral valve: Structurally normal valve. There was no regurgitation. - Right ventricle: Systolic function was normal. - Right atrium: The atrium was normal in size. - Tricuspid valve: There was trivial regurgitation. - Pulmonary arteries: Systolic pressure was within the normal range.   01-07-15: renal ultrasound: Limited examination demonstrating no acute abnormality. Negative for Hydronephrosis. Possible nonobstructing stone lower pole left kidney.  01-18-15: IVC filter placement: Successful IVC filter  placement. This is temporary or can remain in place to become permanent.  01-28-15: ct of head: 1. No acute intracranial findings demonstrated. Mild atrophy and periventricular white matter disease. 2. Bilateral mastoid effusions with opacification of the left middle ear.  06-17-15: chest x-ray; Cardiomegaly with mild pulmonary vascular congestion  06-19-15: ct guided prostate abscess drain: Fluid collection along the right side of the prostate compatible with an abscess. 30 mL of purulent fluid was removed.  06-24-15: ct of abdomen and pelvis: Fatty infiltration of the liver. Bilateral nonobstructive nephrolithiasis. Atherosclerosis of abdominal aorta without aneurysm formation. Sigmoid  diverticulosis is noted without inflammation. Interval placement of transgluteal drainage catheter into right side of the pelvis. Fluid collection noted adjacent to prostate gland on prior exam has been completely decompressed.  06-25-15: kub: Moderate ileus pattern.    LABS REVIEWED:   04-09-15: inr 2.16 coumadin 7 mg 04-15-15: inr 2.16 coumadin 7 mg 04-12-15: glucose 101; bun 20; creat 0.99; k+3.3; na++ 140 06-17-15: wbc 12.6; hgb 13.6; hct 41.0; mcv 86.1; plt 282; glucose 137; bun 24; creat 0.90; k+ 3.3; na++137; alk phos  173; albumin 3.1; blood culture: no growth 06-18-15: hgb a1c 6.4 06-20-15: wbv 9.6; hgb 11.6; hct 36.7; mvc 87.4 plt 335 06-22-15: wbc 11.7; hgb 13.1; hct 40.3; mcv 85.4; plt 420; glucose 114; bun 19; creat 0.95; k+ 3.4; na++133 06-25-15: wbc 11.4; hgb 13.1; hct 41.1; mcv 84.7; plt 461; glucose 128; bun 36; creat 1.23; k+ 3.3; na++135; mag 2.1  06-28-15: wbc 11.;5 hgb 13.5; hct 39.6; mcv 82.2; plt 462; glucose 106; bun 40; creat 1.44; k+ 3.6; na++134 07-01-15: INR 2.19 07-29-15; INR 2.68  11-11-15: inr 2.26: coumadin 6 mg  12-02-15: INR 2.56 11-17-15: liver normal albumin 3.6; chol 154; ldl 92; trig 101; hdl 42  02-10-16: wbc 9.5; hgb 13.8; hct 40.4; mcv 82.3 ;plt 322; glucose 98; bun 35;  creat 1.46; k+ 2.6; na++ 132; alk phos 174; albumin 3.9; chol 155; ldl 83; trig 115; hdl 49  02-13-16: glucose 112; bun 26; creat 1.25; k+ 2.7; na++ 132  02-14-16: wbc 9.4; hgb 14.2; hct 42.1; mcv 81.6; plt 326; glucose 99; bun 27 creat 1.42; k+ 3.0; na++ 133; alk phos 17 ast 33; alt 27; albumin 3.7  02-17-16: glucose 83; bun 31; creat 1.53; k+ 3.2; na++ 136  02-19-16: urine osmolality: 351;  02-20-16: glucose 93; bun 26; creat 1.59; k+ 3.8; na++ 133  02-25-16: glucose 88; bun 16; creat 1.24; k+ 5.0; na++ 138 02-27-16; glucose 101; bun 15; creat 1.16; k+ 4.5; na++ 139  03-02-16: INR 2.9: coumadin 6.5 mg daily  03-23-16: INR 1.8 03-30-16: INR 1.4    Review of Systems Constitutional: Negative for appetite change and fatigue.  HENT: Negative for congestion.   Respiratory: Negative for cough, chest tightness and shortness of breath.   Cardiovascular: Negative for chest pain, palpitations and leg swelling.  Gastrointestinal: Negative for nausea, abdominal pain, diarrhea and constipation.  Musculoskeletal: Negative for myalgias and arthralgias.  Skin: Negative for pallor.  Neurological: Negative for dizziness. has bilateral hand tremors present.  Psychiatric/Behavioral: The patient is not nervous/anxious.     Physical Exam Constitutional: He is oriented to person, place, and time. No distress. is obese  Eyes: Conjunctivae are normal.  Neck: Neck supple. No JVD present. No thyromegaly present.  Cardiovascular: Normal rate, regular rhythm and intact distal pulses.   Respiratory: Effort normal and breath sounds normal. No respiratory distress. He has no wheezes.  Has  trach   GI: Soft. Bowel sounds are normal. He exhibits no distension. There is no tenderness.    Musculoskeletal: no lower extremity edema   Able to move all extremities  Bilateral lower extremities discolored from impaired circulation Has bilateral hand tremor present; is less Lymphadenopathy:    He has no cervical adenopathy.    Neurological: He is alert and oriented to person, place, and time.  Skin: Skin is warm and dry. He is not diaphoretic.   Psychiatric: He has a normal mood and affect.      ASSESSMENT/ PLAN:  1.  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 Will stop coumadin and will start xarelto 20 mg every evening and will monitor his status.     Ok Edwards NP [facility]  Contact [phone] Monday through Friday 8am- 5pm  After hours call [phone]

## 2016-04-11 ENCOUNTER — Emergency Department (HOSPITAL_COMMUNITY)
Admission: EM | Admit: 2016-04-11 | Discharge: 2016-04-11 | Disposition: A | Discharge status: Home / Self Care | Payer: BLUE CROSS/BLUE SHIELD | Attending: Emergency Medicine | Admitting: Emergency Medicine

## 2016-04-11 ENCOUNTER — Encounter (HOSPITAL_COMMUNITY): Payer: Self-pay

## 2016-04-11 DIAGNOSIS — G4733 Obstructive sleep apnea (adult) (pediatric): Secondary | ICD-10-CM

## 2016-04-11 DIAGNOSIS — Z87891 Personal history of nicotine dependence: Secondary | ICD-10-CM | POA: Diagnosis not present

## 2016-04-11 DIAGNOSIS — Z7901 Long term (current) use of anticoagulants: Secondary | ICD-10-CM | POA: Diagnosis not present

## 2016-04-11 DIAGNOSIS — Z430 Encounter for attention to tracheostomy: Secondary | ICD-10-CM

## 2016-04-11 DIAGNOSIS — J449 Chronic obstructive pulmonary disease, unspecified: Secondary | ICD-10-CM | POA: Insufficient documentation

## 2016-04-11 DIAGNOSIS — N182 Chronic kidney disease, stage 2 (mild): Secondary | ICD-10-CM | POA: Insufficient documentation

## 2016-04-11 DIAGNOSIS — I130 Hypertensive heart and chronic kidney disease with heart failure and stage 1 through stage 4 chronic kidney disease, or unspecified chronic kidney disease: Secondary | ICD-10-CM | POA: Insufficient documentation

## 2016-04-11 DIAGNOSIS — I5032 Chronic diastolic (congestive) heart failure: Secondary | ICD-10-CM | POA: Insufficient documentation

## 2016-04-11 NOTE — Progress Notes (Signed)
Dr. Donnald Garre placed #4CFS Shiley back into stoma, patient tolerated well, secured trach with a trach tie, good color change on ETCO2 detector, BBS clear, good strong cough, SATS 100% on room air, HR 84 BPM, will continue to monitor patient.

## 2016-04-11 NOTE — Discharge Instructions (Signed)
Continue trach care as per previously managed.

## 2016-04-11 NOTE — Consult Note (Signed)
Tried to reinsert #4cfs trach back in per Dr. Donnald Garre order, stoma very narrow trach would not go back in, informed MD and she suggested to place a 5.0 ETT in stoma for now until ENT is consulted, 5.0 ETT placed in stoma with no difficulty, SATS remained between 97-99% on room air,  Good color change on ETCO2 detector, patient tolerated well, secured ETT with tape and informed RN, will continue to monitor patient.

## 2016-04-11 NOTE — ED Notes (Signed)
Report called to [employer] [facility]

## 2016-04-11 NOTE — ED Provider Notes (Signed)
[facility]-EMERGENCY DEPT Provider Note   CSN: [id number] Arrival date & time: 04/11/16  1018  First Provider Contact:  First MD Initiated Contact with Patient 04/11/16 1112        History   Chief Complaint No chief complaint on file.   HPI [name] is a 65 y.o. male.  HPI Patient awoke this morning to find his trach out. He is sent from [facility] for trach replacement. Patient denies any other acute complaints. Past Medical History:  Diagnosis Date  . Arthritis   . CHF (congestive heart failure) ([facility])   . COPD (chronic obstructive pulmonary disease) ([facility])   . Hyperlipidemia   . Hypertension   . Morbid obesity ([facility])   . Multiple abrasions    rt arm  . RCT (rotator cuff tear)     Patient Active Problem List   Diagnosis Date Noted  . Tremor of both hands 02/29/2016  . GERD without esophagitis 02/29/2016  . Chronic renal failure, stage 2 (mild) 02/17/2016  . Presence of IVC filter 01/06/2016  . Hypertensive heart disease with CHF (congestive heart failure) ([facility]) 12/27/2015  . Dyslipidemia 11/10/2015  . Depression with anxiety 09/02/2015  . Chronic diastolic congestive heart failure ([facility]) 04/22/2015  . Hypokalemia 04/22/2015  . Chronic respiratory failure with hypoxia ([facility])   . Tracheostomy status ([facility])   . Obesity hypoventilation syndrome ([facility]) 01/20/2015  . Chronic deep vein thrombosis (DVT) ([facility])   . BMI 50.0-59.9, adult ([facility]) 11/29/2014    Past Surgical History:  Procedure Laterality Date  . 40981    . NO PAST SURGERIES    . SHOULDER ARTHROSCOPY WITH SUBACROMIAL DECOMPRESSION, ROTATOR CUFF REPAIR AND BICEP TENDON REPAIR Right 12/28/2014   Procedure: RIGHT SHOULDER ARTHROSCOPY WITH SUBACROMIAL DECOMPRESSION, DISTAL CLAVICLE RESECTION, ROTATOR CUFF REPAIR ;  Surgeon: Eugenia Mcalpine, MD;  Location: [facility];  Service: Orthopedics;  Laterality: Right;  . TRACHEOSTOMY REVISION N/A 01/18/2015   Procedure: TRACHEOSTOMY REVISION;  Surgeon: Melvenia Beam, MD;  Location: [facility] OR;   Service: ENT;  Laterality: N/A;  . TRACHEOSTOMY TUBE PLACEMENT N/A 01/16/2015   Procedure: TRACHEOSTOMY;  Surgeon: Flo Shanks, MD;  Location: [facility] OR;  Service: ENT;  Laterality: N/A;  . TRACHEOSTOMY TUBE PLACEMENT N/A 01/22/2015   Procedure: TRACHEOSTOMY;  Surgeon: Flo Shanks, MD;  Location: [facility] OR;  Service: ENT;  Laterality: N/A;       Home Medications    Prior to Admission medications   Medication Sig Start Date End Date Taking? Authorizing Provider  atorvastatin (LIPITOR) 20 MG tablet Take 20 mg by mouth daily.   Yes Historical Provider, MD  clonazePAM (KLONOPIN) 0.5 MG tablet Take 0.25 mg by mouth at bedtime.    Yes Historical Provider, MD  docusate sodium (COLACE) 100 MG capsule Take 1 capsule (100 mg total) by mouth 2 (two) times daily. 06/26/15  Yes Dorothea Ogle, MD  DULoxetine (CYMBALTA) 60 MG capsule Take 60 mg by mouth daily.   Yes Historical Provider, MD  famotidine (PEPCID) 20 MG tablet Take 20 mg by mouth daily.   Yes Historical Provider, MD  folic acid (FOLVITE) 1 MG tablet Take 1 mg by mouth daily.   Yes Historical Provider, MD  furosemide (LASIX) 20 MG tablet Take 20 mg by mouth daily.   Yes Historical Provider, MD  hydrALAZINE (APRESOLINE) 25 MG tablet Take 1.5 tablets (37.5 mg total) by mouth 3 (three) times daily. Patient taking differently: Take 37.5 mg by mouth 2 (two) times daily.  02/17/16  Yes Sharee Holster, NP  ipratropium-albuterol (DUONEB) 0.5-2.5 (3) MG/3ML SOLN Take 3 mLs by nebulization every 6 (six) hours as needed. For shortness of breath   Yes Historical Provider, MD  isosorbide mononitrate (ISMO,MONOKET) 20 MG tablet Take 20 mg by mouth 2 (two) times daily at 10 AM and 5 PM.   Yes Historical Provider, MD  lidocaine (LIDODERM) 5 % Place 1 patch onto the skin daily. Remove & Discard patch within 12 hours or as directed by MD To right shoulder   Yes Historical Provider, MD  Omega-3 Fatty Acids (FISH OIL) 1000 MG CAPS Take 1,000 mg by mouth daily.    Yes  Historical Provider, MD  ondansetron (ZOFRAN) 4 MG tablet Take 1 tablet (4 mg total) by mouth every 6 (six) hours as needed for nausea or vomiting. 03/16/16  Yes Sharee Holster, NP  polyethylene glycol (MIRALAX / GLYCOLAX) packet Take 17 g by mouth daily. 06/26/15  Yes Dorothea Ogle, MD  Potassium (POTASSIMIN PO) Take 40 mEq by mouth 4 (four) times daily.    Yes Historical Provider, MD  rivaroxaban (XARELTO) 20 MG TABS tablet Take 20 mg by mouth daily with supper.   Yes Historical Provider, MD  triamterene (DYRENIUM) 50 MG capsule Take 1 capsule (50 mg total) by mouth daily. 02/17/16  Yes Sharee Holster, NP    Family History Family History  Problem Relation Age of Onset  . Heart disease Mother   . Heart attack Mother   . Heart disease Brother     Social History Social History  Substance Use Topics  . Smoking status: Former Smoker    Packs/day: 0.20    Types: Cigarettes  . Smokeless tobacco: Former Neurosurgeon  . Alcohol use No     Allergies   Review of patient's allergies indicates no known allergies.   Review of Systems Review of Systems Constitutional: No recent fever chills Respiratory: No shortness of breath cough or chest pain.  Physical Exam Updated Vital Signs BP 125/86   Pulse 60   Temp 97.5 F (36.4 C)   Resp 20   SpO2 97%   Physical Exam  Constitutional:  Patient is alert and nontoxic. No respiratory distress. Moderate obesity.  HENT:  Head: Normocephalic and atraumatic.  Eyes: EOM are normal.  Neck: Neck supple.  Ostomy is patent with no bleeding or discharge. Janina Mayo is completely discharged from ostomy site  Cardiovascular: Normal rate, regular rhythm, normal heart sounds and intact distal pulses.   Pulmonary/Chest: Effort normal and breath sounds normal.  Neurological: He is alert.  Skin: Skin is warm and dry.  Psychiatric: He has a normal mood and affect.     ED Treatments / Results  Labs (all labs ordered are listed, but only abnormal results are  displayed) Labs Reviewed - No data to display  EKG  EKG Interpretation None       Radiology No results found.  Procedures Procedures (including critical care time)  Tracheostomy tube replacement.  Respiratory had placed a 5.0 cuffed endotracheal tube to maintain placement after being unable to pass the 4.0 uncuffed Shiley. Respiratory cleaned the patient's  tracheostomy with obturator. After removing the 5.0 endotracheal tube, I was then able to pass the patient's tracheostomy with some slight positioning adjustments. Patient tolerated well. Good air flow.  Medications Ordered in ED Medications - No data to display   Initial Impression / Assessment and Plan / ED Course  I have reviewed the triage vital signs and the nursing notes.  Pertinent labs & imaging  results that were available during my care of the patient were reviewed by me and considered in my medical decision making (see chart for details).  Clinical Course   Consult: ENT Dr. Jenne Pane. He requested after initial attempt at placement of a tracheostomy to attempt again with the obturator as the 5.0 ET tube was able to pass, we would likely be able to re-insert the 4.0 Shiley.  Final Clinical Impressions(s) / ED Diagnoses   Final diagnoses:  Encounter for tracheostomy tube change [facility])    New Prescriptions New Prescriptions   No medications on file     Arby Barrette, MD 04/11/16 1401

## 2016-04-11 NOTE — ED Triage Notes (Signed)
BIB [facility] with his trach out. Report sts that it came out in his sleep and this happened a few times in the past few weeks. Able to talk, A/O x4. Lung sounds clear, and O2 sats 97%

## 2016-04-12 NOTE — Procedures (Signed)
   Patient Name: [name], [name] GramsJames Study Date: 04/02/2016 Gender: Male D.O.B: 08-May-1951 Age (years): 64 Referring Provider: Flo ShanksKarol Wolicki Height (inches): 72 Interpreting Physician: Jetty Duhamellinton Charly Holcomb MD, ABSM Weight (lbs): 224 RPSGT: Rolene ArbourMcConnico, Yvonne BMI: 30 MRN: [id number] Neck Size: 18.00 CLINICAL INFORMATION Sleep Study Type: NPSG with tracheostomy capped and uncapped   Indication for sleep study: OSA   Epworth Sleepiness Score: 12/24   SLEEP STUDY TECHNIQUE As per the AASM Manual for the Scoring of Sleep and Associated Events v2.3 (April 2016) with a hypopnea requiring 4% desaturations. The channels recorded and monitored were frontal, central and occipital EEG, electrooculogram (EOG), submentalis EMG (chin), nasal and oral airflow, thoracic and abdominal wall motion, anterior tibialis EMG, snore microphone, electrocardiogram, and pulse oximetry.  MEDICATIONS Patient's medications include: charted for review. Medications self-administered by patient during sleep study : No sleep medicine administered.  SLEEP ARCHITECTURE The study was initiated at 10:42:36 PM and ended at 4:49:36 AM. Sleep onset time was 10.2 minutes and the sleep efficiency was 74.2%. The total sleep time was 272.3 minutes. Stage REM latency was N/A minutes. The patient spent 6.98% of the night in stage N1 sleep, 93.02% in stage N2 sleep, 0.00% in stage N3 and 0.00% in REM. Alpha intrusion was absent. Supine sleep was 91.57%.  RESPIRATORY PARAMETERS The overall apnea/hypopnea index (AHI) was 39.9 per hour. There were 146 total apneas, including 62 obstructive, 84 central and 0 mixed apneas. There were 35 hypopneas and 10 RERAs. The AHI during Stage REM sleep was N/A per hour. AHI while supine was 42.6 per hour. The mean oxygen saturation was 93.74%. The minimum SpO2 during sleep was 85.00%. Moderate snoring was noted during this study.  CARDIAC DATA The 2 lead EKG demonstrated sinus rhythm. The mean  heart rate was 60.63 beats per minute. Other EKG findings include: None.  LEG MOVEMENT DATA The total PLMS were 39 with a resulting PLMS index of 8.59. Associated arousal with leg movement index was 0.2 .  IMPRESSIONS - Severe obstructive sleep apnea occurred during this study (AHI = 39.9/h). 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 - Tracheostomy cap removed at 2:18 AM - Mild central sleep apnea occurred during this study (CAI = 18.5/h). - Mild oxygen desaturation was noted during this study (Min O2 = 85.00%). - The patient snored with Moderate snoring volume while trach capped. - No cardiac abnormalities were noted during this study. - Mild periodic limb movements of sleep occurred during the study. No significant associated arousals.  DIAGNOSIS - Obstructive Sleep Apnea (327.23 [G47.33 ICD-10]) - Central Sleep Apnea (327.27 [G47.37 ICD-10])  RECOMMENDATIONS - Return to provider to discuss treatment options. - Positional therapy avoiding supine position during sleep. - Avoid alcohol, sedatives and other CNS depressants that may worsen sleep apnea and disrupt normal sleep architecture. - Sleep hygiene should be reviewed to assess factors that may improve sleep quality. - Weight management and regular exercise should be initiated or continued if appropriate.  [Electronically signed] 04/12/2016 01:00 PM  Jetty Duhamellinton Anthany Thornhill MD, ABSM Diplomate, American Board of Sleep Medicine   NPI: [id number]  Waymon BudgeYOUNG,Cornisha Zetino D Diplomate, American Board of Sleep Medicine  ELECTRONICALLY SIGNED ON:  04/12/2016, 12:41 PM  [facility] PH: [phone]   FX: [phone] ACCREDITED BY THE AMERICAN ACADEMY OF SLEEP MEDICINE

## 2016-04-18 DIAGNOSIS — Z5181 Encounter for therapeutic drug level monitoring: Secondary | ICD-10-CM | POA: Insufficient documentation

## 2016-04-18 DIAGNOSIS — Z7901 Long term (current) use of anticoagulants: Secondary | ICD-10-CM

## 2016-04-20 ENCOUNTER — Non-Acute Institutional Stay: Payer: BLUE CROSS/BLUE SHIELD | Admitting: Adult Health

## 2016-04-20 ENCOUNTER — Encounter: Payer: Self-pay | Admitting: Adult Health

## 2016-04-20 DIAGNOSIS — E1169 Type 2 diabetes mellitus with other specified complication: Secondary | ICD-10-CM | POA: Insufficient documentation

## 2016-04-20 DIAGNOSIS — J9611 Chronic respiratory failure with hypoxia: Secondary | ICD-10-CM

## 2016-04-20 DIAGNOSIS — E662 Morbid (severe) obesity with alveolar hypoventilation: Secondary | ICD-10-CM | POA: Diagnosis not present

## 2016-04-20 DIAGNOSIS — Z6841 Body Mass Index (BMI) 40.0 and over, adult: Secondary | ICD-10-CM | POA: Diagnosis not present

## 2016-04-20 DIAGNOSIS — N182 Chronic kidney disease, stage 2 (mild): Secondary | ICD-10-CM | POA: Diagnosis not present

## 2016-04-20 DIAGNOSIS — E785 Hyperlipidemia, unspecified: Secondary | ICD-10-CM

## 2016-04-20 DIAGNOSIS — I82501 Chronic embolism and thrombosis of unspecified deep veins of right lower extremity: Secondary | ICD-10-CM | POA: Diagnosis not present

## 2016-04-20 DIAGNOSIS — E1129 Type 2 diabetes mellitus with other diabetic kidney complication: Secondary | ICD-10-CM | POA: Insufficient documentation

## 2016-04-20 DIAGNOSIS — E876 Hypokalemia: Secondary | ICD-10-CM

## 2016-04-20 DIAGNOSIS — I110 Hypertensive heart disease with heart failure: Secondary | ICD-10-CM

## 2016-04-20 DIAGNOSIS — I5032 Chronic diastolic (congestive) heart failure: Secondary | ICD-10-CM

## 2016-04-20 DIAGNOSIS — E1122 Type 2 diabetes mellitus with diabetic chronic kidney disease: Secondary | ICD-10-CM | POA: Diagnosis not present

## 2016-04-20 NOTE — Progress Notes (Signed)
Patient ID: [name], male   DOB: 1950-11-16, 65 y.o.   MRN: [id number]   Location:   [place] Room Number: 141-A Place of Service:  SNF (31)   CODE STATUS: Full Code  No Known Allergies  Chief Complaint  Patient presents with  . Medical Management of Chronic Issues    Follow up    HPI:  He is a long term resident of this facility being seen for the management of his chronic illnesses. Overall his status is stable. He tells me that he is feeling good. His weight is stable at 220 pounds. There are no nursing concerns at this time.    Past Medical History:  Diagnosis Date  . Arthritis   . CHF (congestive heart failure) ([place])   . COPD (chronic obstructive pulmonary disease) ([place])   . Hyperlipidemia   . Hypertension   . Morbid obesity ([place])   . Multiple abrasions    rt arm  . RCT (rotator cuff tear)     Past Surgical History:  Procedure Laterality Date  . 25819    . NO PAST SURGERIES    . SHOULDER ARTHROSCOPY WITH SUBACROMIAL DECOMPRESSION, ROTATOR CUFF REPAIR AND BICEP TENDON REPAIR Right 12/28/2014   Procedure: RIGHT SHOULDER ARTHROSCOPY WITH SUBACROMIAL DECOMPRESSION, DISTAL CLAVICLE RESECTION, ROTATOR CUFF REPAIR ;  Surgeon: Sydnee Cabal, MD;  Location: [facility];  Service: Orthopedics;  Laterality: Right;  . TRACHEOSTOMY REVISION N/A 01/18/2015   Procedure: TRACHEOSTOMY REVISION;  Surgeon: Ruby Cola, MD;  Location: [facility] OR;  Service: ENT;  Laterality: N/A;  . TRACHEOSTOMY TUBE PLACEMENT N/A 01/16/2015   Procedure: TRACHEOSTOMY;  Surgeon: Jodi Marble, MD;  Location: [place];  Service: ENT;  Laterality: N/A;  . TRACHEOSTOMY TUBE PLACEMENT N/A 01/22/2015   Procedure: TRACHEOSTOMY;  Surgeon: Jodi Marble, MD;  Location: [place];  Service: ENT;  Laterality: N/A;    Social History   Social History  . Marital status: Single    Spouse name: N/A  . Number of children: N/A  . Years of education: N/A   Occupational History  . Not on file.   Social  History Main Topics  . Smoking status: Former Smoker    Packs/day: 0.20    Types: Cigarettes  . Smokeless tobacco: Former Systems developer  . Alcohol use No  . Drug use: No  . Sexual activity: Not on file   Other Topics Concern  . Not on file   Social History Narrative  . No narrative on file   Family History  Problem Relation Age of Onset  . Heart disease Mother   . Heart attack Mother   . Heart disease Brother       VITAL SIGNS BP 120/78   Pulse 78   Temp 98 F (36.7 C) (Oral)   Resp 18   Ht 6' (1.829 m)   Wt 220 lb 4 oz (99.9 kg)   SpO2 94%   BMI 29.87 kg/m   Patient's Medications  New Prescriptions   No medications on file  Previous Medications   ATORVASTATIN (LIPITOR) 20 MG TABLET    Take 20 mg by mouth daily.   CLONAZEPAM (KLONOPIN) 0.5 MG TABLET    Take 0.25 mg by mouth at bedtime.    DOCUSATE SODIUM (COLACE) 100 MG CAPSULE    Take 1 capsule (100 mg total) by mouth 2 (two) times daily.   DULOXETINE (CYMBALTA) 60 MG CAPSULE    Take 60 mg by mouth daily.   FAMOTIDINE (PEPCID) 20 MG  TABLET    Take 20 mg by mouth daily.   FOLIC ACID (FOLVITE) 1 MG TABLET    Take 1 mg by mouth daily.   FUROSEMIDE (LASIX) 20 MG TABLET    Take 20 mg by mouth daily.   HYDRALAZINE (APRESOLINE) 25 MG TABLET    Take 25 mg by mouth 2 (two) times daily.   IPRATROPIUM-ALBUTEROL (DUONEB) 0.5-2.5 (3) MG/3ML SOLN    Take 3 mLs by nebulization every 6 (six) hours as needed. For shortness of breath   ISOSORBIDE MONONITRATE (ISMO,MONOKET) 20 MG TABLET    Take 20 mg by mouth 2 (two) times daily at 10 AM and 5 PM.   LIDOCAINE (LIDODERM) 5 %    Place 1 patch onto the skin daily. Remove & Discard patch within 12 hours or as directed by MD To right shoulder   OMEGA-3 FATTY ACIDS (FISH OIL) 1000 MG CAPS    Take 1,000 mg by mouth daily.    ONDANSETRON (ZOFRAN) 4 MG TABLET    Take 1 tablet (4 mg total) by mouth every 6 (six) hours as needed for nausea or vomiting.   POLYETHYLENE GLYCOL (MIRALAX / GLYCOLAX)  PACKET    Take 17 g by mouth daily.   POTASSIUM (POTASSIMIN PO)    Take 40 mEq by mouth 4 (four) times daily.    RIVAROXABAN (XARELTO) 20 MG TABS TABLET    Take 20 mg by mouth daily with supper.   TORSEMIDE (DEMADEX) 10 MG TABLET    Take 10 mg by mouth daily.   TRIAMTERENE (DYRENIUM) 50 MG CAPSULE    Take 1 capsule (50 mg total) by mouth daily.  Modified Medications   No medications on file  Discontinued Medications   HYDRALAZINE (APRESOLINE) 25 MG TABLET    Take 1.5 tablets (37.5 mg total) by mouth 3 (three) times daily.     SIGNIFICANT DIAGNOSTIC EXAMS  01-01-15: 2-d echo: Left ventricle: The cavity size was normal. There was mild focal basal and mild concentric hypertrophy of the septum. Systolic function was normal. The estimated ejection fraction was in the range of 60% to 65%. Wall motion was normal; there were no regional wall motion abnormalities. Doppler parameters are consistent with abnormal left ventricular relaxation (grade 1 diastolic dysfunction). There was no evidence of elevate ventricular filling pressure by Doppler parameters. - Aortic valve: There was no regurgitation. - Aortic root: The aortic root was normal in size. - Mitral valve: Structurally normal valve. There was no regurgitation. - Right ventricle: Systolic function was normal. - Right atrium: The atrium was normal in size. - Tricuspid valve: There was trivial regurgitation. - Pulmonary arteries: Systolic pressure was within the normal range.  01-07-15: renal ultrasound: Limited examination demonstrating no acute abnormality. Negative for Hydronephrosis. Possible nonobstructing stone lower pole left kidney.  01-18-15: IVC filter placement: Successful IVC filter placement. This is temporary or can remain in place to become permanent.  01-28-15: ct of head: 1. No acute intracranial findings demonstrated. Mild atrophy and periventricular white matter disease. 2. Bilateral mastoid effusions with opacification of the  left middle ear.  06-17-15: chest x-ray; Cardiomegaly with mild pulmonary vascular congestion  06-19-15: ct guided prostate abscess drain: Fluid collection along the right side of the prostate compatible with an abscess. 30 mL of purulent fluid was removed.  06-24-15: ct of abdomen and pelvis: Fatty infiltration of the liver. Bilateral nonobstructive nephrolithiasis. Atherosclerosis of abdominal aorta without aneurysm formation. Sigmoid diverticulosis is noted without inflammation. Interval placement of transgluteal drainage catheter  into right side of the pelvis. Fluid collection noted adjacent to prostate gland on prior exam has been completely decompressed.  06-25-15: kub: Moderate ileus pattern.    LABS REVIEWED:   04-09-15: inr 2.16 coumadin 7 mg 04-15-15: inr 2.16 coumadin 7 mg 04-12-15: glucose 101; bun 20; creat 0.99; k+3.3; na++ 140 06-17-15: wbc 12.6; hgb 13.6; hct 41.0; mcv 86.1; plt 282; glucose 137; bun 24; creat 0.90; k+ 3.3; na++137; alk phos  173; albumin 3.1; blood culture: no growth 06-18-15: hgb a1c 6.4 06-20-15: wbv 9.6; hgb 11.6; hct 36.7; mvc 87.4 plt 335 06-22-15: wbc 11.7; hgb 13.1; hct 40.3; mcv 85.4; plt 420; glucose 114; bun 19; creat 0.95; k+ 3.4; na++133 06-25-15: wbc 11.4; hgb 13.1; hct 41.1; mcv 84.7; plt 461; glucose 128; bun 36; creat 1.23; k+ 3.3; na++135; mag 2.1  06-28-15: wbc 11.;5 hgb 13.5; hct 39.6; mcv 82.2; plt 462; glucose 106; bun 40; creat 1.44; k+ 3.6; na++134 07-01-15: INR 2.19 07-29-15; INR 2.68  11-11-15: inr 2.26: coumadin 6 mg  12-02-15: INR 2.56 11-17-15: liver normal albumin 3.6; chol 154; ldl 92; trig 101; hdl 42  02-10-16: wbc 9.5; hgb 13.8; hct 40.4; mcv 82.3 ;plt 322; glucose 98; bun 35; creat 1.46; k+ 2.6; na++ 132; alk phos 174; albumin 3.9; chol 155; ldl 83; trig 115; hdl 49  02-13-16: glucose 112; bun 26; creat 1.25; k+ 2.7; na++ 132  02-14-16: wbc 9.4; hgb 14.2; hct 42.1; mcv 81.6; plt 326; glucose 99; bun 27 creat 1.42; k+ 3.0; na++ 133;  alk phos 17 ast 33; alt 27; albumin 3.7  02-17-16: glucose 83; bun 31; creat 1.53; k+ 3.2; na++ 136  02-19-16: urine osmolality: 351;  02-20-16: glucose 93; bun 26; creat 1.59; k+ 3.8; na++ 133  02-25-16: glucose 88; bun 16; creat 1.24; k+ 5.0; na++ 138 02-27-16; glucose 101; bun 15; creat 1.16; k+ 4.5; na++ 139  03-02-16: INR 2.9: coumadin 6.5 mg daily  03-23-16: INR 1.8 03-30-16: INR 1.4    Review of Systems Constitutional: Negative for appetite change and fatigue.  HENT: Negative for congestion.   Respiratory: Negative for cough, chest tightness and shortness of breath.   Cardiovascular: Negative for chest pain, palpitations and leg swelling.  Gastrointestinal: Negative for nausea, abdominal pain, diarrhea and constipation.  Musculoskeletal: Negative for myalgias and arthralgias.  Skin: Negative for pallor.  Neurological: Negative for dizziness.  Psychiatric/Behavioral: The patient is not nervous/anxious.     Physical Exam Constitutional: He is oriented to person, place, and time. No distress. is obese  Eyes: Conjunctivae are normal.  Neck: Neck supple. No JVD present. No thyromegaly present.  Cardiovascular: Normal rate, regular rhythm and intact distal pulses.   Respiratory: Effort normal and breath sounds normal. No respiratory distress. He has no wheezes.  Has  trach   GI: Soft. Bowel sounds are normal. He exhibits no distension. There is no tenderness.    Musculoskeletal: no lower extremity edema   Able to move all extremities  Bilateral lower extremities discolored from impaired circulation Lymphadenopathy:    He has no cervical adenopathy.  Neurological: He is alert and oriented to person, place, and time.  Skin: Skin is warm and dry. He is not diaphoretic.   Psychiatric: He has a normal mood and affect.      ASSESSMENT/ PLAN:  1. Chronic pain: his pain is presently being managed with lidoderm patch to right shoulder; will not make changes will monitor his status   2.   chronic respiratory failure; obesity hypoventilation  syndrome: has duoneb every 6 hours as needed;   is presenlty stable is trach for structure support. His trach is slowly being reduced. Will monitor    3. Diastolic heart failure: will continue lasix 20 mg daily; dyrenium 50 mg daily  his zaroxolyn was stopped due to his renal function.   4. Hypokalemia: will continue  k+  40 meq 4 times daily    5. Hypertension: will continue hydralazine 25 mg twice daily  isordil 20 mg twice daily   6. Jerrye Bushy: will continue pepcid 20 mg daily will change the zofran to 4 mg every 6 hours as needed; his reglan as stopped due to tremors; will monitor   7. Constipation: will continue miralax daily colace twice daily   8. Depression with anxiety: will continue cymbalta 60 mg  daily; take klonopin 0.25 mg nightly  for anxiety  9.  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   10. Dyslipidemia: will continue lipitor 20 mg daily and fish oil 1 gm daily ldl 83; trig 115  11. Diabetes: his hgb a1c is 6.4. Is presently not on medications; will not make changes and will monitor his status.    Will check cmp; hgb a1c urine micro-albumin    Ok Edwards NP [facility]  Contact [phone] Monday through Friday 8am- 5pm  After hours call [phone]

## 2016-04-22 LAB — HEPATIC FUNCTION PANEL
ALT: 14 U/L (ref 10–40)
AST: 15 U/L (ref 14–40)
Alkaline Phosphatase: 115 U/L (ref 25–125)
BILIRUBIN, TOTAL: 0.8 mg/dL

## 2016-04-22 LAB — HEMOGLOBIN A1C: Hemoglobin A1C: 4.8

## 2016-04-28 ENCOUNTER — Encounter: Payer: Self-pay | Admitting: Internal Medicine

## 2016-04-28 ENCOUNTER — Non-Acute Institutional Stay: Payer: BLUE CROSS/BLUE SHIELD | Admitting: Internal Medicine

## 2016-04-28 DIAGNOSIS — E1122 Type 2 diabetes mellitus with diabetic chronic kidney disease: Secondary | ICD-10-CM | POA: Diagnosis not present

## 2016-04-28 DIAGNOSIS — L309 Dermatitis, unspecified: Secondary | ICD-10-CM | POA: Diagnosis not present

## 2016-04-28 DIAGNOSIS — N182 Chronic kidney disease, stage 2 (mild): Secondary | ICD-10-CM | POA: Diagnosis not present

## 2016-04-28 NOTE — Progress Notes (Signed)
Patient ID: [name], male   DOB: Jan 15, 1951, 65 y.o.   MRN: [id number]    DATE: 04/28/16   Location:    [facility] Room Number: 141 A Place of Service: SNF (31)   Extended Emergency Contact Information Primary Emergency Contact: Sneed,Charles Address: [place], [place] [address] [place] Home Phone: [phone] Mobile Phone: [phone] Relation: Friend  Advanced Directive information Does patient have an advance directive?: No, Would patient like information on creating an advanced directive?: No - patient declined information  Chief Complaint  Patient presents with  . Acute Visit    Rash on the right side of body     HPI:  65 yo male long term resident seen today for rash. He reports 2 day hx itchy rash on right lateral thigh and b/l UE. No pain. Nursing reports washing detergent changed recently.  No insect bites. No recent travel hx. He has hx DM. CBG 101 today.  Past Medical History:  Diagnosis Date  . Arthritis   . CHF (congestive heart failure) ([facility])   . COPD (chronic obstructive pulmonary disease) ([facility])   . Hyperlipidemia   . Hypertension   . Morbid obesity ([facility])   . Multiple abrasions    rt arm  . RCT (rotator cuff tear)     Past Surgical History:  Procedure Laterality Date  . 60779    . NO PAST SURGERIES    . SHOULDER ARTHROSCOPY WITH SUBACROMIAL DECOMPRESSION, ROTATOR CUFF REPAIR AND BICEP TENDON REPAIR Right 12/28/2014   Procedure: RIGHT SHOULDER ARTHROSCOPY WITH SUBACROMIAL DECOMPRESSION, DISTAL CLAVICLE RESECTION, ROTATOR CUFF REPAIR ;  Surgeon: Eugenia Mcalpine, MD;  Location: [facility];  Service: Orthopedics;  Laterality: Right;  . TRACHEOSTOMY REVISION N/A 01/18/2015   Procedure: TRACHEOSTOMY REVISION;  Surgeon: Melvenia Beam, MD;  Location: [facility] OR;  Service: ENT;  Laterality: N/A;  . TRACHEOSTOMY TUBE PLACEMENT N/A 01/16/2015   Procedure: TRACHEOSTOMY;  Surgeon: Flo Shanks, MD;  Location: [facility] OR;  Service:  ENT;  Laterality: N/A;  . TRACHEOSTOMY TUBE PLACEMENT N/A 01/22/2015   Procedure: TRACHEOSTOMY;  Surgeon: Flo Shanks, MD;  Location: [facility] OR;  Service: ENT;  Laterality: N/A;    Patient Care Team: Kirt Boys, DO as PCP - General (Internal Medicine) Sharee Holster, NP as Nurse Practitioner (Geriatric Medicine) [facility] (Skilled Nursing Facility)  Social History   Social History  . Marital status: Single    Spouse name: N/A  . Number of children: N/A  . Years of education: N/A   Occupational History  . Not on file.   Social History Main Topics  . Smoking status: Former Smoker    Packs/day: 0.20    Types: Cigarettes  . Smokeless tobacco: Former Neurosurgeon  . Alcohol use No  . Drug use: No  . Sexual activity: Not on file   Other Topics Concern  . Not on file   Social History Narrative  . No narrative on file     reports that he has quit smoking. His smoking use included Cigarettes. He smoked 0.20 packs per day. He has quit using smokeless tobacco. He reports that he does not drink alcohol or use drugs.  Family History  Problem Relation Age of Onset  . Heart disease Mother   . Heart attack Mother   . Heart disease Brother    Family Status  Relation Status  . Mother Deceased  . Father Deceased  . Sister  Alive  . Brother Alive  . Brother Deceased    Immunization History  Administered Date(s) Administered  . Pneumococcal Polysaccharide-23 01/14/2015    No Known Allergies  Medications: Patient's Medications  New Prescriptions   No medications on file  Previous Medications   ATORVASTATIN (LIPITOR) 20 MG TABLET    Take 20 mg by mouth daily.   CLONAZEPAM (KLONOPIN) 0.5 MG TABLET    Take 0.25 mg by mouth at bedtime.    DOCUSATE SODIUM (COLACE) 100 MG CAPSULE    Take 1 capsule (100 mg total) by mouth 2 (two) times daily.   DULOXETINE (CYMBALTA) 60 MG CAPSULE    Take 60 mg by mouth daily.   FAMOTIDINE (PEPCID) 20 MG TABLET    Take 20 mg by mouth  daily.   FOLIC ACID (FOLVITE) 1 MG TABLET    Take 1 mg by mouth daily.   HYDRALAZINE (APRESOLINE) 25 MG TABLET    Take 25 mg by mouth 2 (two) times daily.   IPRATROPIUM-ALBUTEROL (DUONEB) 0.5-2.5 (3) MG/3ML SOLN    Take 3 mLs by nebulization every 6 (six) hours as needed. For shortness of breath   ISOSORBIDE MONONITRATE (ISMO,MONOKET) 20 MG TABLET    Take 20 mg by mouth 2 (two) times daily at 10 AM and 5 PM.   LIDOCAINE (LIDODERM) 5 %    Place 1 patch onto the skin daily. Remove & Discard patch within 12 hours or as directed by MD To right shoulder   OMEGA-3 FATTY ACIDS (FISH OIL) 1000 MG CAPS    Take 1,000 mg by mouth daily.    ONDANSETRON (ZOFRAN) 4 MG TABLET    Take 1 tablet (4 mg total) by mouth every 6 (six) hours as needed for nausea or vomiting.   POLYETHYLENE GLYCOL (MIRALAX / GLYCOLAX) PACKET    Take 17 g by mouth daily.   POTASSIUM (POTASSIMIN PO)    Take 40 mEq by mouth 4 (four) times daily.    RIVAROXABAN (XARELTO) 20 MG TABS TABLET    Take 20 mg by mouth daily with supper.   TORSEMIDE (DEMADEX) 10 MG TABLET    Take 10 mg by mouth daily.   TRIAMTERENE (DYRENIUM) 50 MG CAPSULE    Take 1 capsule (50 mg total) by mouth daily.  Modified Medications   No medications on file  Discontinued Medications   FUROSEMIDE (LASIX) 20 MG TABLET    Take 20 mg by mouth daily.    Review of Systems  Skin: Positive for rash.  All other systems reviewed and are negative.   Vitals:   04/28/16 1243  BP: 138/78  Pulse: 80  Resp: 18  Temp: 98.9 F (37.2 C)  TempSrc: Oral  SpO2: 96%  Weight: 224 lb (101.6 kg)  Height: 6' (1.829 m)   Body mass index is 30.38 kg/m.  Physical Exam  Constitutional:  sitting on bed in NAD  Neck:  Trach intact  Musculoskeletal: He exhibits edema.  Neurological: He is alert.  Skin: Skin is warm and dry. Rash (right raised red blancheable rash on lateral thigh with similar rash on b/l UE) noted.  Psychiatric: He has a normal mood and affect. His behavior is  normal.     Labs reviewed: Nursing Home on 04/20/2016  Component Date Value Ref Range Status  . INR 03/23/2016 1.8* 0.9 - 1.1 Final  . Protime 03/23/2016 18.9* 10.0 - 13.8 seconds Final  Nursing Home on 03/16/2016  Component Date Value Ref Range Status  . INR 02/10/2016  2.5* 0.9 - 1.1 Final  . Protime 02/10/2016 27.3* 10.0 - 13.8 seconds Final  . Glucose 02/20/2016 93  mg/dL Final  . BUN 02/20/2016 26* 4 - 21 mg/dL Final  . Creatinine 02/20/2016 1.6* 0.6 - 1.3 mg/dL Final  . Potassium 02/20/2016 3.8  3.4 - 5.3 mmol/L Final  . Sodium 02/20/2016 133* 137 - 147 mmol/L Final  . Triglycerides 02/10/2016 115  40 - 160 mg/dL Final  . Cholesterol 02/10/2016 155  0 - 200 mg/dL Final  . HDL 02/10/2016 49  35 - 70 mg/dL Final  . LDL Cholesterol 02/10/2016 83  mg/dL Final  Nursing Home on 02/27/2016  Component Date Value Ref Range Status  . Glucose 02/17/2016 83  mg/dL Final  . BUN 02/17/2016 31* 4 - 21 mg/dL Final  . Creatinine 02/17/2016 1.5* 0.6 - 1.3 mg/dL Final  . Potassium 02/17/2016 3.2* 3.4 - 5.3 mmol/L Final  . Sodium 02/17/2016 136* 137 - 147 mmol/L Final  . Osmolality, Ur 02/27/2016 351   Final  . Chloride, Urine Random 02/27/2016 104   Final  . Sodium, Ur 02/27/2016 53   Final  . Potassium Urine 02/27/2016 57  mmol/hr Final  Nursing Home on 02/20/2016  Component Date Value Ref Range Status  . Glucose 02/13/2016 112  mg/dL Final  . BUN 02/13/2016 26* 4 - 21 mg/dL Final  . Creatinine 02/13/2016 1.2  0.6 - 1.3 mg/dL Final  . Potassium 02/13/2016 2.7* 3.4 - 5.3 mmol/L Final  . Sodium 02/13/2016 132* 137 - 147 mmol/L Final  Admission on 02/14/2016, Discharged on 02/14/2016  Component Date Value Ref Range Status  . WBC 02/14/2016 9.4  4.0 - 10.5 K/uL Final  . RBC 02/14/2016 5.16  4.22 - 5.81 MIL/uL Final  . Hemoglobin 02/14/2016 14.2  13.0 - 17.0 g/dL Final  . HCT 02/14/2016 42.1  39.0 - 52.0 % Final  . MCV 02/14/2016 81.6  78.0 - 100.0 fL Final  . MCH 02/14/2016 27.5  26.0 -  34.0 pg Final  . MCHC 02/14/2016 33.7  30.0 - 36.0 g/dL Final  . RDW 02/14/2016 15.4  11.5 - 15.5 % Final  . Platelets 02/14/2016 326  150 - 400 K/uL Final  . Neutrophils Relative % 02/14/2016 55  % Final  . Neutro Abs 02/14/2016 5.1  1.7 - 7.7 K/uL Final  . Lymphocytes Relative 02/14/2016 30  % Final  . Lymphs Abs 02/14/2016 2.8  0.7 - 4.0 K/uL Final  . Monocytes Relative 02/14/2016 12  % Final  . Monocytes Absolute 02/14/2016 1.1* 0.1 - 1.0 K/uL Final  . Eosinophils Relative 02/14/2016 3  % Final  . Eosinophils Absolute 02/14/2016 0.3  0.0 - 0.7 K/uL Final  . Basophils Relative 02/14/2016 0  % Final  . Basophils Absolute 02/14/2016 0.0  0.0 - 0.1 K/uL Final  . Sodium 02/14/2016 133* 135 - 145 mmol/L Final  . Potassium 02/14/2016 3.0* 3.5 - 5.1 mmol/L Final  . Chloride 02/14/2016 94* 101 - 111 mmol/L Final  . CO2 02/14/2016 26  22 - 32 mmol/L Final  . Glucose, Bld 02/14/2016 99  65 - 99 mg/dL Final  . BUN 02/14/2016 27* 6 - 20 mg/dL Final  . Creatinine, Ser 02/14/2016 1.42* 0.61 - 1.24 mg/dL Final  . Calcium 02/14/2016 9.7  8.9 - 10.3 mg/dL Final  . Total Protein 02/14/2016 8.1  6.5 - 8.1 g/dL Final  . Albumin 02/14/2016 3.7  3.5 - 5.0 g/dL Final  . AST 02/14/2016 33  15 - 41  U/L Final  . ALT 02/14/2016 27  17 - 63 U/L Final  . Alkaline Phosphatase 02/14/2016 174* 38 - 126 U/L Final  . Total Bilirubin 02/14/2016 0.9  0.3 - 1.2 mg/dL Final  . GFR calc non Af Amer 02/14/2016 51* >60 mL/min Final  . GFR calc Af Amer 02/14/2016 60* >60 mL/min Final   Comment: (NOTE) The eGFR has been calculated using the CKD EPI equation. This calculation has not been validated in all clinical situations. eGFR's persistently <60 mL/min signify possible Chronic Kidney Disease.   . Anion gap 02/14/2016 13  5 - 15 Final  Nursing Home on 02/06/2016  Component Date Value Ref Range Status  . INR 01/27/2016 2.5* 0.9 - 1.1 Final  . Protime 01/27/2016 27.4* 10.0 - 13.8 seconds Final    No results  found.   Assessment/Plan   ICD-9-CM ICD-10-CM   1. Dermatitis 692.9 L30.9   2. Controlled type 2 diabetes mellitus with stage 2 chronic kidney disease, without long-term [place] use of insulin (HCC) 250.40 E11.22    585.2 N18.2    Start prednisone taper '10mg'$  - take 3 tabs po daily x 2 --> 2 tabs --> 1 tab --> stop  Cont other meds as ordered  Wound care as indicated  Will follow  Naviyah Schaffert S. Perlie Gold  [facility] [address] [place], [place] [address] [phone] Cell (Monday-Friday 8 AM - 5 PM) [phone] After 5 PM and follow prompts

## 2016-05-01 LAB — BASIC METABOLIC PANEL
BUN: 22 mg/dL — AB (ref 4–21)
CREATININE: 1.2 mg/dL (ref 0.6–1.3)
Glucose: 79 mg/dL
Potassium: 4 mmol/L (ref 3.4–5.3)
Sodium: 140 mmol/L (ref 137–147)

## 2016-05-01 LAB — MICROALBUMIN, URINE: MICROALB UR: 1.3

## 2016-05-25 ENCOUNTER — Non-Acute Institutional Stay: Payer: BLUE CROSS/BLUE SHIELD | Admitting: Internal Medicine

## 2016-05-25 ENCOUNTER — Encounter: Payer: Self-pay | Admitting: Internal Medicine

## 2016-05-25 DIAGNOSIS — B86 Scabies: Secondary | ICD-10-CM | POA: Diagnosis not present

## 2016-05-25 NOTE — Progress Notes (Signed)
Patient ID: [name], male   DOB: March 08, 1951, 65 y.o.   MRN: [id number]    DATE: 05/25/2016  Location:    [facility] Room Number: 141 A Place of Service: SNF (31)   Extended Emergency Contact Information Primary Emergency Contact: Sneed,Charles Address: [place], [place] [address] [place] Home Phone: [phone] Mobile Phone: [phone] Relation: Friend  Advanced Directive information Does patient have an advance directive?: No, Would patient like information on creating an advanced directive?: No - patient declined information  Chief Complaint  Patient presents with  . Acute Visit    rash on legs possible Scabies     HPI:  65 yo male long term resident seen today for rash. Pt reports he completed prednisone taper for dermatitis last month. He now has a crusting, intensely pruritic rash x 2 weeks on b/l thigh, back and UE. No insect bites. No recent travel hx. No f/c. Pruritus interrupts sleep. He has a hx DM. No low BS reactions.   Past Medical History:  Diagnosis Date  . Arthritis   . CHF (congestive heart failure) (HCC)   . COPD (chronic obstructive pulmonary disease) (HCC)   . Hyperlipidemia   . Hypertension   . Morbid obesity (HCC)   . Multiple abrasions    rt arm  . RCT (rotator cuff tear)     Past Surgical History:  Procedure Laterality Date  . 69629    . NO PAST SURGERIES    . SHOULDER ARTHROSCOPY WITH SUBACROMIAL DECOMPRESSION, ROTATOR CUFF REPAIR AND BICEP TENDON REPAIR Right 12/28/2014   Procedure: RIGHT SHOULDER ARTHROSCOPY WITH SUBACROMIAL DECOMPRESSION, DISTAL CLAVICLE RESECTION, ROTATOR CUFF REPAIR ;  Surgeon: Eugenia Mcalpine, MD;  Location: [facility];  Service: Orthopedics;  Laterality: Right;  . TRACHEOSTOMY REVISION N/A 01/18/2015   Procedure: TRACHEOSTOMY REVISION;  Surgeon: Melvenia Beam, MD;  Location: [facility] OR;  Service: ENT;  Laterality: N/A;  . TRACHEOSTOMY TUBE PLACEMENT N/A 01/16/2015   Procedure:  TRACHEOSTOMY;  Surgeon: Flo Shanks, MD;  Location: [facility] OR;  Service: ENT;  Laterality: N/A;  . TRACHEOSTOMY TUBE PLACEMENT N/A 01/22/2015   Procedure: TRACHEOSTOMY;  Surgeon: Flo Shanks, MD;  Location: [facility] OR;  Service: ENT;  Laterality: N/A;    Patient Care Team: Kirt Boys, DO as PCP - General (Internal Medicine) Sharee Holster, NP as Nurse Practitioner (Geriatric Medicine) [facility]  Social History   Social History  . Marital status: Single    Spouse name: N/A  . Number of children: N/A  . Years of education: N/A   Occupational History  . Not on file.   Social History Main Topics  . Smoking status: Former Smoker    Packs/day: 0.20    Types: Cigarettes  . Smokeless tobacco: Former Neurosurgeon  . Alcohol use No  . Drug use: No  . Sexual activity: Not on file   Other Topics Concern  . Not on file   Social History Narrative  . No narrative on file     reports that he has quit smoking. His smoking use included Cigarettes. He smoked 0.20 packs per day. He has quit using smokeless tobacco. He reports that he does not drink alcohol or use drugs.  Family History  Problem Relation Age of Onset  . Heart disease Mother   . Heart attack Mother   . Heart disease Brother    Family Status  Relation Status  .  Mother Deceased  . Father Deceased  . Sister Alive  . Brother Alive  . Brother Deceased    Immunization History  Administered Date(s) Administered  . Pneumococcal Polysaccharide-23 01/14/2015    No Known Allergies  Medications: Patient's Medications  New Prescriptions   No medications on file  Previous Medications   ATORVASTATIN (LIPITOR) 20 MG TABLET    Take 20 mg by mouth daily.   CLONAZEPAM (KLONOPIN) 0.5 MG TABLET    Take 0.25 mg by mouth at bedtime.    DIPHENHYDRAMINE (BENADRYL) 25 MG TABLET    Take 25 mg by mouth every 6 (six) hours as needed for itching.   DOCUSATE SODIUM (COLACE) 100 MG CAPSULE    Take 1  capsule (100 mg total) by mouth 2 (two) times daily.   DULOXETINE (CYMBALTA) 60 MG CAPSULE    Take 60 mg by mouth daily.   FAMOTIDINE (PEPCID) 20 MG TABLET    Take 20 mg by mouth daily.   FOLIC ACID (FOLVITE) 1 MG TABLET    Take 1 mg by mouth daily.   HYDRALAZINE (APRESOLINE) 25 MG TABLET    Give 1.5 mg by mouth two times daily   IPRATROPIUM-ALBUTEROL (DUONEB) 0.5-2.5 (3) MG/3ML SOLN    Take 3 mLs by nebulization every 6 (six) hours as needed. For shortness of breath   ISOSORBIDE MONONITRATE (ISMO,MONOKET) 20 MG TABLET    Take 20 mg by mouth 2 (two) times daily at 10 AM and 5 PM.   LIDOCAINE (LIDODERM) 5 %    Place 1 patch onto the skin daily. Remove & Discard patch within 12 hours or as directed by MD To right shoulder   OMEGA-3 FATTY ACIDS (FISH OIL) 1000 MG CAPS    Take 1,000 mg by mouth daily.    ONDANSETRON (ZOFRAN) 4 MG TABLET    Take 1 tablet (4 mg total) by mouth every 6 (six) hours as needed for nausea or vomiting.   POLYETHYLENE GLYCOL (MIRALAX / GLYCOLAX) PACKET    Take 17 g by mouth daily.   POTASSIUM (POTASSIMIN PO)    Take 40 mEq by mouth 4 (four) times daily.    RIVAROXABAN (XARELTO) 20 MG TABS TABLET    Take 20 mg by mouth daily with supper.   TORSEMIDE (DEMADEX) 10 MG TABLET    Take 10 mg by mouth daily.   TRIAMTERENE (DYRENIUM) 50 MG CAPSULE    Take 1 capsule (50 mg total) by mouth daily.  Modified Medications   No medications on file  Discontinued Medications   No medications on file    Review of Systems  Skin: Positive for rash.  Psychiatric/Behavioral: Positive for sleep disturbance.  All other systems reviewed and are negative.   Vitals:   05/25/16 0949  BP: 116/70  Pulse: 60  Resp: 20  Temp: 98.8 F (37.1 C)  TempSrc: Oral  SpO2: 96%  Weight: 230 lb 3.2 oz (104.4 kg)  Height: 6' (1.829 m)   Body mass index is 31.22 kg/m.  Physical Exam  Constitutional: He appears well-developed and well-nourished.  sitting on bed in NAD  Neck:  Trach intact    Musculoskeletal: He exhibits edema.  Neurological: He is alert.  Skin: Skin is warm and dry. Rash (b/l anterior thigh crusting rash, red, no linear burrows or vesicular formation. red rasied whelps on back and b/l UE. no web of finger involvement) noted.  Psychiatric: He has a normal mood and affect. His behavior is normal. Thought content normal.  Labs reviewed: Nursing Home on 04/20/2016  Component Date Value Ref Range Status  . INR 03/23/2016 1.8* 0.9 - 1.1 Final  . Protime 03/23/2016 18.9* 10.0 - 13.8 seconds Final  Nursing Home on 03/16/2016  Component Date Value Ref Range Status  . INR 02/10/2016 2.5* 0.9 - 1.1 Final  . Protime 02/10/2016 27.3* 10.0 - 13.8 seconds Final  . Glucose 02/20/2016 93  mg/dL Final  . BUN 78/29/562106/15/2017 26* 4 - 21 mg/dL Final  . Creatinine 30/86/578406/15/2017 1.6* 0.6 - 1.3 mg/dL Final  . Potassium 69/62/952806/15/2017 3.8  3.4 - 5.3 mmol/L Final  . Sodium 02/20/2016 133* 137 - 147 mmol/L Final  . Triglycerides 02/10/2016 115  40 - 160 mg/dL Final  . Cholesterol 41/32/440106/01/2016 155  0 - 200 mg/dL Final  . HDL 02/72/536606/01/2016 49  35 - 70 mg/dL Final  . LDL Cholesterol 02/10/2016 83  mg/dL Final  Nursing Home on 02/27/2016  Component Date Value Ref Range Status  . Glucose 02/17/2016 83  mg/dL Final  . BUN 44/03/474206/08/2016 31* 4 - 21 mg/dL Final  . Creatinine 59/56/387506/08/2016 1.5* 0.6 - 1.3 mg/dL Final  . Potassium 64/33/295106/08/2016 3.2* 3.4 - 5.3 mmol/L Final  . Sodium 02/17/2016 136* 137 - 147 mmol/L Final  . Osmolality, Ur 02/27/2016 351   Final  . Chloride, Urine Random 02/27/2016 104   Final  . Sodium, Ur 02/27/2016 53   Final  . Potassium Urine 02/27/2016 57  mmol/hr Final    No results found.   Assessment/Plan   ICD-9-CM ICD-10-CM   1. Scabies 133.0 B86    early crusting noted    Start Permithrin 5% crm - apply head to toe in PM and wash off in AM. Repeat dose in 1 week.  Contact isolation until tx completed  T/c derm eval if no better  Will follow   Deona Novitski S. Ancil Linseyarter, D.  O., F. A. C. O. I.  [facility] [address] [place], [place] [address] [phone] Cell (Monday-Friday 8 AM - 5 PM) [phone] After 5 PM and follow prompts

## 2016-05-28 DIAGNOSIS — Z0271 Encounter for disability determination: Secondary | ICD-10-CM

## 2016-06-01 ENCOUNTER — Non-Acute Institutional Stay: Payer: BLUE CROSS/BLUE SHIELD | Admitting: Adult Health

## 2016-06-01 ENCOUNTER — Encounter: Payer: Self-pay | Admitting: Adult Health

## 2016-06-01 DIAGNOSIS — E785 Hyperlipidemia, unspecified: Secondary | ICD-10-CM

## 2016-06-01 DIAGNOSIS — I110 Hypertensive heart disease with heart failure: Secondary | ICD-10-CM | POA: Diagnosis not present

## 2016-06-01 DIAGNOSIS — N182 Chronic kidney disease, stage 2 (mild): Secondary | ICD-10-CM | POA: Diagnosis not present

## 2016-06-01 DIAGNOSIS — I82501 Chronic embolism and thrombosis of unspecified deep veins of right lower extremity: Secondary | ICD-10-CM | POA: Diagnosis not present

## 2016-06-01 DIAGNOSIS — E1122 Type 2 diabetes mellitus with diabetic chronic kidney disease: Secondary | ICD-10-CM

## 2016-06-01 DIAGNOSIS — J9611 Chronic respiratory failure with hypoxia: Secondary | ICD-10-CM | POA: Diagnosis not present

## 2016-06-01 DIAGNOSIS — E1169 Type 2 diabetes mellitus with other specified complication: Secondary | ICD-10-CM

## 2016-06-01 DIAGNOSIS — I5032 Chronic diastolic (congestive) heart failure: Secondary | ICD-10-CM

## 2016-06-01 NOTE — Progress Notes (Signed)
Patient ID: [name], male   DOB: 1951/04/05, 65 y.o.   MRN: [id number]    Location:   [place] Room Number: 141-A Place of Service:  SNF (31)   CODE STATUS: Full Code  No Known Allergies  Chief Complaint  Patient presents with  . Medical Management of Chronic Issues    follow up    HPI:  He is a long term resident of this facility being seen for the management of his chronic illnesses. Overall his status is stable; he is not voicing any complaints at this time. There are no nursing concerns at this time.  He is presently being treated for a scabies rash   Past Medical History:  Diagnosis Date  . Arthritis   . CHF (congestive heart failure) ([place])   . COPD (chronic obstructive pulmonary disease) ([place])   . Hyperlipidemia   . Hypertension   . Morbid obesity ([place])   . Multiple abrasions    rt arm  . RCT (rotator cuff tear)     Past Surgical History:  Procedure Laterality Date  . 25819    . NO PAST SURGERIES    . SHOULDER ARTHROSCOPY WITH SUBACROMIAL DECOMPRESSION, ROTATOR CUFF REPAIR AND BICEP TENDON REPAIR Right 12/28/2014   Procedure: RIGHT SHOULDER ARTHROSCOPY WITH SUBACROMIAL DECOMPRESSION, DISTAL CLAVICLE RESECTION, ROTATOR CUFF REPAIR ;  Surgeon: Sydnee Cabal, MD;  Location: [facility];  Service: Orthopedics;  Laterality: Right;  . TRACHEOSTOMY REVISION N/A 01/18/2015   Procedure: TRACHEOSTOMY REVISION;  Surgeon: Ruby Cola, MD;  Location: [facility] OR;  Service: ENT;  Laterality: N/A;  . TRACHEOSTOMY TUBE PLACEMENT N/A 01/16/2015   Procedure: TRACHEOSTOMY;  Surgeon: Jodi Marble, MD;  Location: [place];  Service: ENT;  Laterality: N/A;  . TRACHEOSTOMY TUBE PLACEMENT N/A 01/22/2015   Procedure: TRACHEOSTOMY;  Surgeon: Jodi Marble, MD;  Location: [place];  Service: ENT;  Laterality: N/A;    Social History   Social History  . Marital status: Single    Spouse name: N/A  . Number of children: N/A  . Years of education: N/A   Occupational History  . Not  on file.   Social History Main Topics  . Smoking status: Former Smoker    Packs/day: 0.20    Types: Cigarettes  . Smokeless tobacco: Former Systems developer  . Alcohol use No  . Drug use: No  . Sexual activity: Not on file   Other Topics Concern  . Not on file   Social History Narrative  . No narrative on file   Family History  Problem Relation Age of Onset  . Heart disease Mother   . Heart attack Mother   . Heart disease Brother       VITAL SIGNS BP (!) 142/80   Pulse 82   Temp 97.6 F (36.4 C) (Oral)   Resp 18   Ht 6' (1.829 m)   Wt 207 lb 8 oz (94.1 kg)   SpO2 97%   BMI 28.14 kg/m   Patient's Medications  New Prescriptions   No medications on file  Previous Medications   ATORVASTATIN (LIPITOR) 20 MG TABLET    Take 20 mg by mouth daily.   CLONAZEPAM (KLONOPIN) 0.5 MG TABLET    Take 0.25 mg by mouth at bedtime.    DIPHENHYDRAMINE (BENADRYL) 25 MG TABLET    Take 25 mg by mouth every 6 (six) hours as needed for itching.   DOCUSATE SODIUM (COLACE) 100 MG CAPSULE    Take 1 capsule (100 mg total)  by mouth 2 (two) times daily.   DULOXETINE (CYMBALTA) 60 MG CAPSULE    Take 60 mg by mouth daily.   FAMOTIDINE (PEPCID) 20 MG TABLET    Take 20 mg by mouth daily.   FOLIC ACID (FOLVITE) 1 MG TABLET    Take 1 mg by mouth daily.   HYDRALAZINE (APRESOLINE) 25 MG TABLET    Give 1.5 mg by mouth two times daily   IPRATROPIUM-ALBUTEROL (DUONEB) 0.5-2.5 (3) MG/3ML SOLN    Take 3 mLs by nebulization every 6 (six) hours as needed. For shortness of breath   ISOSORBIDE MONONITRATE (ISMO,MONOKET) 20 MG TABLET    Take 20 mg by mouth 2 (two) times daily at 10 AM and 5 PM.   LIDOCAINE (LIDODERM) 5 %    Place 1 patch onto the skin daily. Remove & Discard patch within 12 hours or as directed by MD To right shoulder   OMEGA-3 FATTY ACIDS (FISH OIL) 1000 MG CAPS    Take 1,000 mg by mouth daily.    ONDANSETRON (ZOFRAN) 4 MG TABLET    Take 1 tablet (4 mg total) by mouth every 6 (six) hours as needed for  nausea or vomiting.   POLYETHYLENE GLYCOL (MIRALAX / GLYCOLAX) PACKET    Take 17 g by mouth daily.   POTASSIUM (POTASSIMIN PO)    Take 40 mEq by mouth 4 (four) times daily.    RIVAROXABAN (XARELTO) 20 MG TABS TABLET    Take 20 mg by mouth daily with supper.   TORSEMIDE (DEMADEX) 10 MG TABLET    Take 10 mg by mouth daily.   TRIAMTERENE (DYRENIUM) 50 MG CAPSULE    Take 1 capsule (50 mg total) by mouth daily.  Modified Medications   No medications on file  Discontinued Medications   No medications on file     SIGNIFICANT DIAGNOSTIC EXAMS  01-01-15: 2-d echo: Left ventricle: The cavity size was normal. There was mild focal basal and mild concentric hypertrophy of the septum. Systolic function was normal. The estimated ejection fraction was in the range of 60% to 65%. Wall motion was normal; there were no regional wall motion abnormalities. Doppler parameters are consistent with abnormal left ventricular relaxation (grade 1 diastolic dysfunction). There was no evidence of elevate ventricular filling pressure by Doppler parameters. - Aortic valve: There was no regurgitation. - Aortic root: The aortic root was normal in size. - Mitral valve: Structurally normal valve. There was no regurgitation. - Right ventricle: Systolic function was normal. - Right atrium: The atrium was normal in size. - Tricuspid valve: There was trivial regurgitation. - Pulmonary arteries: Systolic pressure was within the normal range.  01-07-15: renal ultrasound: Limited examination demonstrating no acute abnormality. Negative for Hydronephrosis. Possible nonobstructing stone lower pole left kidney.  01-18-15: IVC filter placement: Successful IVC filter placement. This is temporary or can remain in place to become permanent.  01-28-15: ct of head: 1. No acute intracranial findings demonstrated. Mild atrophy and periventricular white matter disease. 2. Bilateral mastoid effusions with opacification of the left middle  ear.  06-17-15: chest x-ray; Cardiomegaly with mild pulmonary vascular congestion  06-19-15: ct guided prostate abscess drain: Fluid collection along the right side of the prostate compatible with an abscess. 30 mL of purulent fluid was removed.  06-24-15: ct of abdomen and pelvis: Fatty infiltration of the liver. Bilateral nonobstructive nephrolithiasis. Atherosclerosis of abdominal aorta without aneurysm formation. Sigmoid diverticulosis is noted without inflammation. Interval placement of transgluteal drainage catheter into right side of the  pelvis. Fluid collection noted adjacent to prostate gland on prior exam has been completely decompressed.  06-25-15: kub: Moderate ileus pattern.    LABS REVIEWED:   06-17-15: wbc 12.6; hgb 13.6; hct 41.0; mcv 86.1; plt 282; glucose 137; bun 24; creat 0.90; k+ 3.3; na++137; alk phos  173; albumin 3.1; blood culture: no growth 06-18-15: hgb a1c 6.4 06-20-15: wbv 9.6; hgb 11.6; hct 36.7; mvc 87.4 plt 335 06-22-15: wbc 11.7; hgb 13.1; hct 40.3; mcv 85.4; plt 420; glucose 114; bun 19; creat 0.95; k+ 3.4; na++133 06-25-15: wbc 11.4; hgb 13.1; hct 41.1; mcv 84.7; plt 461; glucose 128; bun 36; creat 1.23; k+ 3.3; na++135; mag 2.1  06-28-15: wbc 11.;5 hgb 13.5; hct 39.6; mcv 82.2; plt 462; glucose 106; bun 40; creat 1.44; k+ 3.6; na++134 07-01-15: INR 2.19 07-29-15; INR 2.68  11-11-15: inr 2.26: coumadin 6 mg  12-02-15: INR 2.56 11-17-15: liver normal albumin 3.6; chol 154; ldl 92; trig 101; hdl 42  02-10-16: wbc 9.5; hgb 13.8; hct 40.4; mcv 82.3 ;plt 322; glucose 98; bun 35; creat 1.46; k+ 2.6; na++ 132; alk phos 174; albumin 3.9; chol 155; ldl 83; trig 115; hdl 49  02-13-16: glucose 112; bun 26; creat 1.25; k+ 2.7; na++ 132  02-14-16: wbc 9.4; hgb 14.2; hct 42.1; mcv 81.6; plt 326; glucose 99; bun 27 creat 1.42; k+ 3.0; na++ 133; alk phos 17 ast 33; alt 27; albumin 3.7  02-17-16: glucose 83; bun 31; creat 1.53; k+ 3.2; na++ 136  02-19-16: urine osmolality: 351;   02-20-16: glucose 93; bun 26; creat 1.59; k+ 3.8; na++ 133  02-25-16: glucose 88; bun 16; creat 1.24; k+ 5.0; na++ 138 02-27-16; glucose 101; bun 15; creat 1.16; k+ 4.5; na++ 139  03-02-16: INR 2.9: coumadin 6.5 mg daily  03-23-16: INR 1.8 03-30-16: INR 1.4  04-22-16: glucose 82; bun 23; creat 1.16; k+ 4.2; na++ 139; liver normal albumin 3.7; hgb a1c 4.8 04-23-16: urine micro-albumin 1.3 05-01-16: glucose 79; bun 22; creat 1.20; k+ 4.0; na++ 140;    Review of Systems Constitutional: Negative for appetite change and fatigue.  HENT: Negative for congestion.   Respiratory: Negative for cough, chest tightness and shortness of breath.   Cardiovascular: Negative for chest pain, palpitations and leg swelling.  Gastrointestinal: Negative for nausea, abdominal pain, diarrhea and constipation.  Musculoskeletal: Negative for myalgias and arthralgias.  Skin: Negative for pallor.  Neurological: Negative for dizziness.  Psychiatric/Behavioral: The patient is not nervous/anxious.     Physical Exam Constitutional: He is oriented to person, place, and time. No distress. is obese  Eyes: Conjunctivae are normal.  Neck: Neck supple. No JVD present. No thyromegaly present.  Cardiovascular: Normal rate, regular rhythm and intact distal pulses.   Respiratory: Effort normal and breath sounds normal. No respiratory distress. He has no wheezes.  Has  trach   GI: Soft. Bowel sounds are normal. He exhibits no distension. There is no tenderness.    Musculoskeletal: no lower extremity edema   Able to move all extremities  Bilateral lower extremities discolored from impaired circulation Lymphadenopathy:    He has no cervical adenopathy.  Neurological: He is alert and oriented to person, place, and time.  Skin: Skin is warm and dry. He is not diaphoretic.   Psychiatric: He has a normal mood and affect.      ASSESSMENT/ PLAN:  1. Chronic pain: his pain is presently being managed with lidoderm patch to right  shoulder; will not make changes will monitor his status  2.  chronic respiratory failure; obesity hypoventilation syndrome: has duoneb every 6 hours as needed;   is presenlty stable is trach for structure support. His trach is slowly being reduced. Will monitor    3. Diastolic heart failure: will continue lasix 20 mg daily; dyrenium 50 mg daily  his zaroxolyn was stopped due to his renal function.   4. Hypokalemia: will continue  k+  40 meq 4 times daily    5. Hypertension: will continue hydralazine 25 mg twice daily  isordil 20 mg twice daily   6. Jerrye Bushy: will continue pepcid 20 mg daily will change the zofran to 4 mg every 6 hours as needed; his reglan as stopped due to tremors; will monitor   7. Constipation: will continue miralax daily colace twice daily   8. Depression with anxiety: will continue cymbalta 60 mg  daily; take klonopin 0.25 mg nightly  for anxiety  9.  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   10. Dyslipidemia: will continue lipitor 20 mg daily and fish oil 1 gm daily ldl 83; trig 115  11. Diabetes: his hgb a1c is 4.8. Is presently not on medications; will not make changes and will monitor his status.     MD is aware of resident's narcotic use and is in agreement with [facility] plan of care. We will attempt to wean resident as apropriate   Ok Edwards NP [facility]  Contact [phone] Monday through Friday 8am- 5pm  After hours call [phone]

## 2016-06-08 ENCOUNTER — Encounter: Payer: Self-pay | Admitting: Adult Health

## 2016-06-08 ENCOUNTER — Non-Acute Institutional Stay: Payer: BLUE CROSS/BLUE SHIELD | Admitting: Adult Health

## 2016-06-08 DIAGNOSIS — B86 Scabies: Secondary | ICD-10-CM

## 2016-06-08 NOTE — Progress Notes (Signed)
Patient ID: [name], male   DOB: Mar 04, 1951, 65 y.o.   MRN: [id number]   Location:   [place] Room Number: 141-A Place of Service:  SNF (31)   CODE STATUS: Full Code  No Known Allergies  Chief Complaint  Patient presents with  . Acute Visit    Scabies    HPI:  He has a scabies appearing rash on his arms abdomen and legs. He does have significant itching present. There are no reports of fever present.   Past Medical History:  Diagnosis Date  . Arthritis   . CHF (congestive heart failure) ([place])   . COPD (chronic obstructive pulmonary disease) ([place])   . Hyperlipidemia   . Hypertension   . Morbid obesity ([place])   . Multiple abrasions    rt arm  . RCT (rotator cuff tear)     Past Surgical History:  Procedure Laterality Date  . 25819    . NO PAST SURGERIES    . SHOULDER ARTHROSCOPY WITH SUBACROMIAL DECOMPRESSION, ROTATOR CUFF REPAIR AND BICEP TENDON REPAIR Right 12/28/2014   Procedure: RIGHT SHOULDER ARTHROSCOPY WITH SUBACROMIAL DECOMPRESSION, DISTAL CLAVICLE RESECTION, ROTATOR CUFF REPAIR ;  Surgeon: Sydnee Cabal, MD;  Location: [facility];  Service: Orthopedics;  Laterality: Right;  . TRACHEOSTOMY REVISION N/A 01/18/2015   Procedure: TRACHEOSTOMY REVISION;  Surgeon: Ruby Cola, MD;  Location: [facility] OR;  Service: ENT;  Laterality: N/A;  . TRACHEOSTOMY TUBE PLACEMENT N/A 01/16/2015   Procedure: TRACHEOSTOMY;  Surgeon: Jodi Marble, MD;  Location: [place];  Service: ENT;  Laterality: N/A;  . TRACHEOSTOMY TUBE PLACEMENT N/A 01/22/2015   Procedure: TRACHEOSTOMY;  Surgeon: Jodi Marble, MD;  Location: [place];  Service: ENT;  Laterality: N/A;    Social History   Social History  . Marital status: Single    Spouse name: N/A  . Number of children: N/A  . Years of education: N/A   Occupational History  . Not on file.   Social History Main Topics  . Smoking status: Former Smoker    Packs/day: 0.20    Types: Cigarettes  . Smokeless tobacco: Former Systems developer  .  Alcohol use No  . Drug use: No  . Sexual activity: Not on file   Other Topics Concern  . Not on file   Social History Narrative  . No narrative on file   Family History  Problem Relation Age of Onset  . Heart disease Mother   . Heart attack Mother   . Heart disease Brother       VITAL SIGNS BP (!) 142/68   Pulse 74   Temp 97.7 F (36.5 C) (Oral)   Resp (!) 24   Ht 6' (1.829 m)   Wt 230 lb (104.3 kg)   SpO2 96%   BMI 31.19 kg/m   Patient's Medications  New Prescriptions   No medications on file  Previous Medications   ATORVASTATIN (LIPITOR) 20 MG TABLET    Take 20 mg by mouth daily.   CLONAZEPAM (KLONOPIN) 0.5 MG TABLET    Take 0.25 mg by mouth at bedtime.    DIPHENHYDRAMINE (BENADRYL) 25 MG TABLET    Take 25 mg by mouth every 6 (six) hours as needed for itching.   DOCUSATE SODIUM (COLACE) 100 MG CAPSULE    Take 1 capsule (100 mg total) by mouth 2 (two) times daily.   DULOXETINE (CYMBALTA) 60 MG CAPSULE    Take 60 mg by mouth daily.   FAMOTIDINE (PEPCID) 20 MG TABLET  Take 20 mg by mouth daily.   FOLIC ACID (FOLVITE) 1 MG TABLET    Take 1 mg by mouth daily.   HYDRALAZINE (APRESOLINE) 25 MG TABLET    Give 1.5 mg by mouth two times daily   IPRATROPIUM-ALBUTEROL (DUONEB) 0.5-2.5 (3) MG/3ML SOLN    Take 3 mLs by nebulization every 6 (six) hours as needed. For shortness of breath   ISOSORBIDE MONONITRATE (ISMO,MONOKET) 20 MG TABLET    Take 20 mg by mouth 2 (two) times daily at 10 AM and 5 PM.   LIDOCAINE (LIDODERM) 5 %    Place 1 patch onto the skin daily. Remove & Discard patch within 12 hours or as directed by MD To right shoulder   OMEGA-3 FATTY ACIDS (FISH OIL) 1000 MG CAPS    Take 1,000 mg by mouth daily.    ONDANSETRON (ZOFRAN) 4 MG TABLET    Take 1 tablet (4 mg total) by mouth every 6 (six) hours as needed for nausea or vomiting.   POLYETHYLENE GLYCOL (MIRALAX / GLYCOLAX) PACKET    Take 17 g by mouth daily.   POTASSIUM (POTASSIMIN PO)    Take 40 mEq by mouth 4  (four) times daily.    RIVAROXABAN (XARELTO) 20 MG TABS TABLET    Take 20 mg by mouth daily with supper.   TORSEMIDE (DEMADEX) 10 MG TABLET    Take 10 mg by mouth daily.   TRIAMTERENE (DYRENIUM) 50 MG CAPSULE    Take 1 capsule (50 mg total) by mouth daily.  Modified Medications   No medications on file  Discontinued Medications   No medications on file     SIGNIFICANT DIAGNOSTIC EXAMS  01-01-15: 2-d echo: Left ventricle: The cavity size was normal. There was mild focal basal and mild concentric hypertrophy of the septum. Systolic function was normal. The estimated ejection fraction was in the range of 60% to 65%. Wall motion was normal; there were no regional wall motion abnormalities. Doppler parameters are consistent with abnormal left ventricular relaxation (grade 1 diastolic dysfunction). There was no evidence of elevate ventricular filling pressure by Doppler parameters. - Aortic valve: There was no regurgitation. - Aortic root: The aortic root was normal in size. - Mitral valve: Structurally normal valve. There was no regurgitation. - Right ventricle: Systolic function was normal. - Right atrium: The atrium was normal in size. - Tricuspid valve: There was trivial regurgitation. - Pulmonary arteries: Systolic pressure was within the normal range.  01-07-15: renal ultrasound: Limited examination demonstrating no acute abnormality. Negative for Hydronephrosis. Possible nonobstructing stone lower pole left kidney.  01-18-15: IVC filter placement: Successful IVC filter placement. This is temporary or can remain in place to become permanent.  01-28-15: ct of head: 1. No acute intracranial findings demonstrated. Mild atrophy and periventricular white matter disease. 2. Bilateral mastoid effusions with opacification of the left middle ear.  06-17-15: chest x-ray; Cardiomegaly with mild pulmonary vascular congestion  06-19-15: ct guided prostate abscess drain: Fluid collection along the  right side of the prostate compatible with an abscess. 30 mL of purulent fluid was removed.  06-24-15: ct of abdomen and pelvis: Fatty infiltration of the liver. Bilateral nonobstructive nephrolithiasis. Atherosclerosis of abdominal aorta without aneurysm formation. Sigmoid diverticulosis is noted without inflammation. Interval placement of transgluteal drainage catheter into right side of the pelvis. Fluid collection noted adjacent to prostate gland on prior exam has been completely decompressed.  06-25-15: kub: Moderate ileus pattern.    LABS REVIEWED:   06-17-15: wbc 12.6; hgb  13.6; hct 41.0; mcv 86.1; plt 282; glucose 137; bun 24; creat 0.90; k+ 3.3; na++137; alk phos  173; albumin 3.1; blood culture: no growth 06-18-15: hgb a1c 6.4 06-20-15: wbv 9.6; hgb 11.6; hct 36.7; mvc 87.4 plt 335 06-22-15: wbc 11.7; hgb 13.1; hct 40.3; mcv 85.4; plt 420; glucose 114; bun 19; creat 0.95; k+ 3.4; na++133 06-25-15: wbc 11.4; hgb 13.1; hct 41.1; mcv 84.7; plt 461; glucose 128; bun 36; creat 1.23; k+ 3.3; na++135; mag 2.1  06-28-15: wbc 11.;5 hgb 13.5; hct 39.6; mcv 82.2; plt 462; glucose 106; bun 40; creat 1.44; k+ 3.6; na++134 07-01-15: INR 2.19 07-29-15; INR 2.68  11-11-15: inr 2.26: coumadin 6 mg  12-02-15: INR 2.56 11-17-15: liver normal albumin 3.6; chol 154; ldl 92; trig 101; hdl 42  02-10-16: wbc 9.5; hgb 13.8; hct 40.4; mcv 82.3 ;plt 322; glucose 98; bun 35; creat 1.46; k+ 2.6; na++ 132; alk phos 174; albumin 3.9; chol 155; ldl 83; trig 115; hdl 49  02-13-16: glucose 112; bun 26; creat 1.25; k+ 2.7; na++ 132  02-14-16: wbc 9.4; hgb 14.2; hct 42.1; mcv 81.6; plt 326; glucose 99; bun 27 creat 1.42; k+ 3.0; na++ 133; alk phos 17 ast 33; alt 27; albumin 3.7  02-17-16: glucose 83; bun 31; creat 1.53; k+ 3.2; na++ 136  02-19-16: urine osmolality: 351;  02-20-16: glucose 93; bun 26; creat 1.59; k+ 3.8; na++ 133  02-25-16: glucose 88; bun 16; creat 1.24; k+ 5.0; na++ 138 02-27-16; glucose 101; bun 15; creat  1.16; k+ 4.5; na++ 139  03-02-16: INR 2.9: coumadin 6.5 mg daily  03-23-16: INR 1.8 03-30-16: INR 1.4  04-22-16: glucose 82; bun 23; creat 1.16; k+ 4.2; na++ 139; liver normal albumin 3.7; hgb a1c 4.8 04-23-16: urine micro-albumin 1.3 05-01-16: glucose 79; bun 22; creat 1.20; k+ 4.0; na++ 140;    Review of Systems Constitutional: Negative for appetite change and fatigue.  HENT: Negative for congestion.   Respiratory: Negative for cough, chest tightness and shortness of breath.   Cardiovascular: Negative for chest pain, palpitations and leg swelling.  Gastrointestinal: Negative for nausea, abdominal pain, diarrhea and constipation.  Musculoskeletal: Negative for myalgias and arthralgias.  Skin: has itchy rash on arms abdomen an legs.   Neurological: Negative for dizziness.  Psychiatric/Behavioral: The patient is not nervous/anxious.     Physical Exam Constitutional: He is oriented to person, place, and time. No distress. is obese  Eyes: Conjunctivae are normal.  Neck: Neck supple. No JVD present. No thyromegaly present.  Cardiovascular: Normal rate, regular rhythm and intact distal pulses.   Respiratory: Effort normal and breath sounds normal. No respiratory distress. He has no wheezes.  Has  trach   GI: Soft. Bowel sounds are normal. He exhibits no distension. There is no tenderness.    Musculoskeletal: no lower extremity edema   Able to move all extremities  Bilateral lower extremities discolored from impaired circulation Lymphadenopathy:    He has no cervical adenopathy.  Neurological: He is alert and oriented to person, place, and time.  Skin: has scabies rash on arms legs and abdomen   Psychiatric: He has a normal mood and affect.      ASSESSMENT/ PLAN:  1. Scabies: will begin permitherin cream to rash and will repeat in 2 weeks will monitor   MD is aware of resident's narcotic use and is in agreement with [facility] plan of care. We will attempt to wean resident as  appropriate.     Ok Edwards NP [facility]  Contact [phone] Monday through Friday 8am- 5pm  After hours call [phone]

## 2016-06-11 ENCOUNTER — Encounter: Payer: Self-pay | Admitting: Adult Health

## 2016-06-11 ENCOUNTER — Non-Acute Institutional Stay: Payer: BLUE CROSS/BLUE SHIELD | Admitting: Adult Health

## 2016-06-11 DIAGNOSIS — J9502 Infection of tracheostomy stoma: Secondary | ICD-10-CM | POA: Diagnosis not present

## 2016-06-11 NOTE — Progress Notes (Signed)
Patient ID: [name], male   DOB: 1950-12-23, 65 y.o.   MRN: [id number]   Location:   [place] Room Number: 141-A Place of Service:  SNF (31)   CODE STATUS: Full Code  No Known Allergies  Chief Complaint  Patient presents with  . Acute Visit    Acute    HPI:  His trach has purulent drainage present. There is redness and inflammation present as well. There are no reports of fever present. He tells me that the area is irritated and sore.    Past Medical History:  Diagnosis Date  . Arthritis   . CHF (congestive heart failure) ([place])   . COPD (chronic obstructive pulmonary disease) ([place])   . Hyperlipidemia   . Hypertension   . Morbid obesity ([place])   . Multiple abrasions    rt arm  . RCT (rotator cuff tear)     Past Surgical History:  Procedure Laterality Date  . 25819    . NO PAST SURGERIES    . SHOULDER ARTHROSCOPY WITH SUBACROMIAL DECOMPRESSION, ROTATOR CUFF REPAIR AND BICEP TENDON REPAIR Right 12/28/2014   Procedure: RIGHT SHOULDER ARTHROSCOPY WITH SUBACROMIAL DECOMPRESSION, DISTAL CLAVICLE RESECTION, ROTATOR CUFF REPAIR ;  Surgeon: Sydnee Cabal, MD;  Location: [facility];  Service: Orthopedics;  Laterality: Right;  . TRACHEOSTOMY REVISION N/A 01/18/2015   Procedure: TRACHEOSTOMY REVISION;  Surgeon: Ruby Cola, MD;  Location: [facility] OR;  Service: ENT;  Laterality: N/A;  . TRACHEOSTOMY TUBE PLACEMENT N/A 01/16/2015   Procedure: TRACHEOSTOMY;  Surgeon: Jodi Marble, MD;  Location: [place];  Service: ENT;  Laterality: N/A;  . TRACHEOSTOMY TUBE PLACEMENT N/A 01/22/2015   Procedure: TRACHEOSTOMY;  Surgeon: Jodi Marble, MD;  Location: [place];  Service: ENT;  Laterality: N/A;    Social History   Social History  . Marital status: Single    Spouse name: N/A  . Number of children: N/A  . Years of education: N/A   Occupational History  . Not on file.   Social History Main Topics  . Smoking status: Former Smoker    Packs/day: 0.20    Types: Cigarettes  .  Smokeless tobacco: Former Systems developer  . Alcohol use No  . Drug use: No  . Sexual activity: Not on file   Other Topics Concern  . Not on file   Social History Narrative  . No narrative on file   Family History  Problem Relation Age of Onset  . Heart disease Mother   . Heart attack Mother   . Heart disease Brother       VITAL SIGNS BP (!) 142/72   Pulse 80   Temp 97 F (36.1 C) (Oral)   Resp 18   Ht 6' (1.829 m)   Wt 233 lb (105.7 kg)   SpO2 95%   BMI 31.60 kg/m   Patient's Medications  New Prescriptions   No medications on file  Previous Medications   ATORVASTATIN (LIPITOR) 20 MG TABLET    Take 20 mg by mouth daily.   CLONAZEPAM (KLONOPIN) 0.5 MG TABLET    Take 0.25 mg by mouth at bedtime.    DIPHENHYDRAMINE (BENADRYL) 25 MG TABLET    Take 25 mg by mouth every 6 (six) hours as needed for itching.   DOCUSATE SODIUM (COLACE) 100 MG CAPSULE    Take 1 capsule (100 mg total) by mouth 2 (two) times daily.   DULOXETINE (CYMBALTA) 60 MG CAPSULE    Take 60 mg by mouth daily.   FAMOTIDINE (  PEPCID) 20 MG TABLET    Take 20 mg by mouth daily.   FOLIC ACID (FOLVITE) 1 MG TABLET    Take 1 mg by mouth daily.   HYDRALAZINE (APRESOLINE) 25 MG TABLET    Give 1.5 mg by mouth two times daily   IPRATROPIUM-ALBUTEROL (DUONEB) 0.5-2.5 (3) MG/3ML SOLN    Take 3 mLs by nebulization every 6 (six) hours as needed. For shortness of breath   ISOSORBIDE MONONITRATE (ISMO,MONOKET) 20 MG TABLET    Take 20 mg by mouth 2 (two) times daily at 10 AM and 5 PM.   LIDOCAINE (LIDODERM) 5 %    Place 1 patch onto the skin daily. Remove & Discard patch within 12 hours or as directed by MD To right shoulder   OMEGA-3 FATTY ACIDS (FISH OIL) 1000 MG CAPS    Take 1,000 mg by mouth daily.    ONDANSETRON (ZOFRAN) 4 MG TABLET    Take 1 tablet (4 mg total) by mouth every 6 (six) hours as needed for nausea or vomiting.   PERMETHRIN (ELIMITE) 5 % CREAM    Apply 1 application topically once. X 14 days   POLYETHYLENE GLYCOL  (MIRALAX / GLYCOLAX) PACKET    Take 17 g by mouth daily.   POTASSIUM (POTASSIMIN PO)    Take 40 mEq by mouth 4 (four) times daily.    RIVAROXABAN (XARELTO) 20 MG TABS TABLET    Take 20 mg by mouth daily with supper.   TORSEMIDE (DEMADEX) 10 MG TABLET    Take 10 mg by mouth daily.   TRIAMTERENE (DYRENIUM) 50 MG CAPSULE    Take 1 capsule (50 mg total) by mouth daily.  Modified Medications   No medications on file  Discontinued Medications   No medications on file     SIGNIFICANT DIAGNOSTIC EXAMS  01-01-15: 2-d echo: Left ventricle: The cavity size was normal. There was mild focal basal and mild concentric hypertrophy of the septum. Systolic function was normal. The estimated ejection fraction was in the range of 60% to 65%. Wall motion was normal; there were no regional wall motion abnormalities. Doppler parameters are consistent with abnormal left ventricular relaxation (grade 1 diastolic dysfunction). There was no evidence of elevate ventricular filling pressure by Doppler parameters. - Aortic valve: There was no regurgitation. - Aortic root: The aortic root was normal in size. - Mitral valve: Structurally normal valve. There was no regurgitation. - Right ventricle: Systolic function was normal. - Right atrium: The atrium was normal in size. - Tricuspid valve: There was trivial regurgitation. - Pulmonary arteries: Systolic pressure was within the normal range.  01-07-15: renal ultrasound: Limited examination demonstrating no acute abnormality. Negative for Hydronephrosis. Possible nonobstructing stone lower pole left kidney.  01-18-15: IVC filter placement: Successful IVC filter placement. This is temporary or can remain in place to become permanent.  01-28-15: ct of head: 1. No acute intracranial findings demonstrated. Mild atrophy and periventricular white matter disease. 2. Bilateral mastoid effusions with opacification of the left middle ear.  06-17-15: chest x-ray; Cardiomegaly with  mild pulmonary vascular congestion  06-19-15: ct guided prostate abscess drain: Fluid collection along the right side of the prostate compatible with an abscess. 30 mL of purulent fluid was removed.  06-24-15: ct of abdomen and pelvis: Fatty infiltration of the liver. Bilateral nonobstructive nephrolithiasis. Atherosclerosis of abdominal aorta without aneurysm formation. Sigmoid diverticulosis is noted without inflammation. Interval placement of transgluteal drainage catheter into right side of the pelvis. Fluid collection noted adjacent to prostate  gland on prior exam has been completely decompressed.  06-25-15: kub: Moderate ileus pattern.    LABS REVIEWED:   06-17-15: wbc 12.6; hgb 13.6; hct 41.0; mcv 86.1; plt 282; glucose 137; bun 24; creat 0.90; k+ 3.3; na++137; alk phos  173; albumin 3.1; blood culture: no growth 06-18-15: hgb a1c 6.4 06-20-15: wbv 9.6; hgb 11.6; hct 36.7; mvc 87.4 plt 335 06-22-15: wbc 11.7; hgb 13.1; hct 40.3; mcv 85.4; plt 420; glucose 114; bun 19; creat 0.95; k+ 3.4; na++133 06-25-15: wbc 11.4; hgb 13.1; hct 41.1; mcv 84.7; plt 461; glucose 128; bun 36; creat 1.23; k+ 3.3; na++135; mag 2.1  06-28-15: wbc 11.;5 hgb 13.5; hct 39.6; mcv 82.2; plt 462; glucose 106; bun 40; creat 1.44; k+ 3.6; na++134 07-01-15: INR 2.19 07-29-15; INR 2.68  11-11-15: inr 2.26: coumadin 6 mg  12-02-15: INR 2.56 11-17-15: liver normal albumin 3.6; chol 154; ldl 92; trig 101; hdl 42  02-10-16: wbc 9.5; hgb 13.8; hct 40.4; mcv 82.3 ;plt 322; glucose 98; bun 35; creat 1.46; k+ 2.6; na++ 132; alk phos 174; albumin 3.9; chol 155; ldl 83; trig 115; hdl 49  02-13-16: glucose 112; bun 26; creat 1.25; k+ 2.7; na++ 132  02-14-16: wbc 9.4; hgb 14.2; hct 42.1; mcv 81.6; plt 326; glucose 99; bun 27 creat 1.42; k+ 3.0; na++ 133; alk phos 17 ast 33; alt 27; albumin 3.7  02-17-16: glucose 83; bun 31; creat 1.53; k+ 3.2; na++ 136  02-19-16: urine osmolality: 351;  02-20-16: glucose 93; bun 26; creat 1.59; k+  3.8; na++ 133  02-25-16: glucose 88; bun 16; creat 1.24; k+ 5.0; na++ 138 02-27-16; glucose 101; bun 15; creat 1.16; k+ 4.5; na++ 139  03-02-16: INR 2.9: coumadin 6.5 mg daily  03-23-16: INR 1.8 03-30-16: INR 1.4  04-22-16: glucose 82; bun 23; creat 1.16; k+ 4.2; na++ 139; liver normal albumin 3.7; hgb a1c 4.8 04-23-16: urine micro-albumin 1.3 05-01-16: glucose 79; bun 22; creat 1.20; k+ 4.0; na++ 140;    Review of Systems Trach is inflamed and sore  Constitutional: Negative for appetite change and fatigue.  HENT: Negative for congestion.   Respiratory: Negative for cough, chest tightness and shortness of breath.   Cardiovascular: Negative for chest pain, palpitations and leg swelling.  Gastrointestinal: Negative for nausea, abdominal pain, diarrhea and constipation.  Musculoskeletal: Negative for myalgias and arthralgias.  Skin: Negative for pallor.  Neurological: Negative for dizziness.  Psychiatric/Behavioral: The patient is not nervous/anxious.     Physical Exam Constitutional: He is oriented to person, place, and time. No distress. is obese  Eyes: Conjunctivae are normal.  Neck: Neck supple. No JVD present. No thyromegaly present.  Cardiovascular: Normal rate, regular rhythm and intact distal pulses.   Respiratory: Effort normal and breath sounds normal. No respiratory distress. He has no wheezes.  Has  trach   GI: Soft. Bowel sounds are normal. He exhibits no distension. There is no tenderness.    Musculoskeletal: no lower extremity edema   Able to move all extremities  Bilateral lower extremities discolored from impaired circulation Lymphadenopathy:    He has no cervical adenopathy.  Neurological: He is alert and oriented to person, place, and time.  Skin: Skin is warm and dry. He is not diaphoretic.   Trach with purulent drainage present; warmth redness present.  Psychiatric: He has a normal mood and affect.      ASSESSMENT/ PLAN:  1. Cellulitis of trach site: will  begin doxycycline 100 mg twice daily for 10 days and will  culture drainage. Will change abt if needed once culture report is available. Will monitor    Ok Edwards NP [facility]  Contact [phone] Monday through Friday 8am- 5pm  After hours call [phone]

## 2016-06-17 ENCOUNTER — Ambulatory Visit (HOSPITAL_COMMUNITY)
Admission: RE | Admit: 2016-06-17 | Discharge: 2016-06-17 | Disposition: A | Discharge status: Home / Self Care | Payer: BLUE CROSS/BLUE SHIELD | Source: Ambulatory Visit | Attending: Acute Care | Admitting: Acute Care

## 2016-06-17 DIAGNOSIS — I110 Hypertensive heart disease with heart failure: Secondary | ICD-10-CM | POA: Diagnosis not present

## 2016-06-17 DIAGNOSIS — G4731 Primary central sleep apnea: Secondary | ICD-10-CM | POA: Insufficient documentation

## 2016-06-17 DIAGNOSIS — J449 Chronic obstructive pulmonary disease, unspecified: Secondary | ICD-10-CM | POA: Diagnosis not present

## 2016-06-17 DIAGNOSIS — M1990 Unspecified osteoarthritis, unspecified site: Secondary | ICD-10-CM | POA: Diagnosis not present

## 2016-06-17 DIAGNOSIS — G4733 Obstructive sleep apnea (adult) (pediatric): Secondary | ICD-10-CM

## 2016-06-17 DIAGNOSIS — Z7901 Long term (current) use of anticoagulants: Secondary | ICD-10-CM | POA: Diagnosis not present

## 2016-06-17 DIAGNOSIS — Z87891 Personal history of nicotine dependence: Secondary | ICD-10-CM | POA: Diagnosis not present

## 2016-06-17 DIAGNOSIS — I5032 Chronic diastolic (congestive) heart failure: Secondary | ICD-10-CM | POA: Diagnosis not present

## 2016-06-17 DIAGNOSIS — E785 Hyperlipidemia, unspecified: Secondary | ICD-10-CM | POA: Diagnosis not present

## 2016-06-17 DIAGNOSIS — Z430 Encounter for attention to tracheostomy: Secondary | ICD-10-CM | POA: Diagnosis present

## 2016-06-17 DIAGNOSIS — Z930 Tracheostomy status: Secondary | ICD-10-CM | POA: Insufficient documentation

## 2016-06-17 NOTE — Progress Notes (Signed)
Tracheostomy Procedure Note  [name] [id number] 09-08-1950  Pre Procedure Tracheostomy Information  Trach Brand: Shiley Size: 4.0 Style: Uncuffed Secured by: Velcro   Procedure: trach  change    Post Procedure Tracheostomy Information  Trach Brand: Shiley Size: 4.0 Style: Uncuffed Secured by: Velcro   Post Procedure Evaluation:  ETCO2 positive color change from yellow to purple : Yes.   Vital signs:blood pressure 153/97, pulse 82, respirations 18 and pulse oximetry 99 % Patients [place] condition: stable Complications: No apparent complications Trach site exam: clean, dry Wound care done: dry Patient did tolerate procedure well.   Education: none  Prescription needs: none    Additional needs: [name] ties and gauze sponges

## 2016-06-19 NOTE — Progress Notes (Signed)
Re: trach clinic establish visit   Subjective:    Patient ID: [name], male    DOB: May 26, 1951, 65 y.o.   MRN: [id number]  HPI  Mr Goggins reports today to get established to trach clinic. He is s/p a prolonged critical illness about 1 year prior where he had a long and drawn out hospital course after a complicated shoulder surgery. This course was high-lighted by cardio-pulmonary arrest d/t loss of airway from trach tube dislodgement, as well as difficulty weaning from mechanical ventilator, profound deconditioning and ultimately trach dependence. He does have a significant history of obstructive sleep apnea which was again verified by PSG with capping trials. On arrival [name] is awake, appropriate, conversant and  Phonating well w/ PMV in place. He was referred to me for further trach care; as well as for consideration for decannulation.   Past Medical History:  Diagnosis Date  . Arthritis   . CHF (congestive heart failure) (HCC)   . COPD (chronic obstructive pulmonary disease) (HCC)   . Hyperlipidemia   . Hypertension   . Morbid obesity (HCC)   . Multiple abrasions    rt arm  . RCT (rotator cuff tear)    Social History   Social History  . Marital status: Single    Spouse name: N/A  . Number of children: N/A  . Years of education: N/A   Occupational History  . Not on file.   Social History Main Topics  . Smoking status: Former Smoker    Packs/day: 0.20    Types: Cigarettes  . Smokeless tobacco: Former NeurosurgeonUser  . Alcohol use No  . Drug use: No  . Sexual activity: Not on file   Other Topics Concern  . Not on file   Social History Narrative  . No narrative on file  No Known Allergies   [facility] Prescriptions:  .  atorvastatin (LIPITOR) 20 MG tablet, Take 20 mg by mouth daily., Disp: , Rfl:  .  clonazePAM (KLONOPIN) 0.5 MG tablet, Take 0.25 mg by mouth at bedtime. , Disp: , Rfl:  .  diphenhydrAMINE (BENADRYL) 25 MG tablet, Take 25 mg by mouth every 6  (six) hours as needed for itching., Disp: , Rfl:  .  docusate sodium (COLACE) 100 MG capsule, Take 1 capsule (100 mg total) by mouth 2 (two) times daily., Disp: 10 capsule, Rfl: 0 .  DULoxetine (CYMBALTA) 60 MG capsule, Take 60 mg by mouth daily., Disp: , Rfl:  .  famotidine (PEPCID) 20 MG tablet, Take 20 mg by mouth daily., Disp: , Rfl:  .  folic acid (FOLVITE) 1 MG tablet, Take 1 mg by mouth daily., Disp: , Rfl:  .  hydrALAZINE (APRESOLINE) 25 MG tablet, Give 1.5 mg by mouth two times daily, Disp: , Rfl:  .  ipratropium-albuterol (DUONEB) 0.5-2.5 (3) MG/3ML SOLN, Take 3 mLs by nebulization every 6 (six) hours as needed. For shortness of breath, Disp: , Rfl:  .  isosorbide mononitrate (ISMO,MONOKET) 20 MG tablet, Take 20 mg by mouth 2 (two) times daily at 10 AM and 5 PM., Disp: , Rfl:  .  lidocaine (LIDODERM) 5 %, Place 1 patch onto the skin daily. Remove & Discard patch within 12 hours or as directed by MD To right shoulder, Disp: , Rfl:  .  Omega-3 Fatty Acids (FISH OIL) 1000 MG CAPS, Take 1,000 mg by mouth daily. , Disp: , Rfl:  .  ondansetron (ZOFRAN) 4 MG tablet, Take 1 tablet (4 mg total) by mouth every  6 (six) hours as needed for nausea or vomiting., Disp: 20 tablet, Rfl: prn .  permethrin (ELIMITE) 5 % cream, Apply 1 application topically once. X 14 days, Disp: , Rfl:  .  polyethylene glycol (MIRALAX / GLYCOLAX) packet, Take 17 g by mouth daily., Disp: 14 each, Rfl: 0 .  Potassium (POTASSIMIN PO), Take 40 mEq by mouth 4 (four) times daily. , Disp: , Rfl:  .  rivaroxaban (XARELTO) 20 MG TABS tablet, Take 20 mg by mouth daily with supper., Disp: , Rfl:  .  torsemide (DEMADEX) 10 MG tablet, Take 10 mg by mouth daily., Disp: , Rfl:  .  triamterene (DYRENIUM) 50 MG capsule, Take 1 capsule (50 mg total) by mouth daily., Disp: 90 capsule, Rfl: prn   Review of Systems  Constitutional: Negative.   HENT: Negative for congestion, drooling and postnasal drip.   Eyes: Negative.   Respiratory:  Negative for apnea, cough, choking, chest tightness, shortness of breath and stridor.        Excellent phonation w/ PMV Tolerated finger occlusion   Cardiovascular: Negative.   Gastrointestinal: Negative.   Endocrine: Negative.   Genitourinary: Negative.   Musculoskeletal: Negative.   Skin: Positive for rash.  Allergic/Immunologic: Negative.   Neurological: Negative.   Hematological: Negative.   Psychiatric/Behavioral: Negative.       Vital signs:blood pressure 153/97, pulse 82, respirations 18 and pulse oximetry 99 % Objective:   Physical Exam  Constitutional: He is oriented to person, place, and time. He appears well-nourished. No distress.  HENT:  Head: Atraumatic.  Mouth/Throat: Oropharyngeal exudate present.  Eyes: Conjunctivae are normal. Pupils are equal, round, and reactive to light. Right eye exhibits no discharge. Left eye exhibits no discharge.  Neck: Normal range of motion. No tracheal deviation present. No thyromegaly present.  Trach unremarkable  Excellent phonation   Cardiovascular: Normal rate, regular rhythm and normal heart sounds.  Exam reveals no gallop and no friction rub.   No murmur heard. Pulmonary/Chest: Effort normal. No stridor. No respiratory distress. He has no wheezes. He has rales.  Abdominal: Soft. Bowel sounds are normal. He exhibits no distension.  Musculoskeletal: Normal range of motion. He exhibits no edema.  Neurological: He is alert and oriented to person, place, and time.  Skin: Skin is warm and dry. Rash noted. He is not diaphoretic.  Psychiatric: He has a normal mood and affect. His behavior is normal.    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   Simonne Martinet ACNP-BC [facility] Pulmonary/Critical Care Pager # [phone] OR # [phone] if no answer

## 2016-06-20 DIAGNOSIS — B86 Scabies: Secondary | ICD-10-CM | POA: Insufficient documentation

## 2016-06-23 DIAGNOSIS — G4733 Obstructive sleep apnea (adult) (pediatric): Secondary | ICD-10-CM | POA: Insufficient documentation

## 2016-07-09 ENCOUNTER — Encounter: Payer: Self-pay | Admitting: Adult Health

## 2016-07-09 ENCOUNTER — Non-Acute Institutional Stay: Payer: BLUE CROSS/BLUE SHIELD | Admitting: Adult Health

## 2016-07-09 DIAGNOSIS — N182 Chronic kidney disease, stage 2 (mild): Secondary | ICD-10-CM | POA: Diagnosis not present

## 2016-07-09 DIAGNOSIS — I82501 Chronic embolism and thrombosis of unspecified deep veins of right lower extremity: Secondary | ICD-10-CM | POA: Diagnosis not present

## 2016-07-09 DIAGNOSIS — J9611 Chronic respiratory failure with hypoxia: Secondary | ICD-10-CM

## 2016-07-09 DIAGNOSIS — Z95828 Presence of other vascular implants and grafts: Secondary | ICD-10-CM

## 2016-07-09 DIAGNOSIS — I5032 Chronic diastolic (congestive) heart failure: Secondary | ICD-10-CM | POA: Diagnosis not present

## 2016-07-09 DIAGNOSIS — E785 Hyperlipidemia, unspecified: Secondary | ICD-10-CM

## 2016-07-09 DIAGNOSIS — Z930 Tracheostomy status: Secondary | ICD-10-CM | POA: Diagnosis not present

## 2016-07-09 DIAGNOSIS — E1169 Type 2 diabetes mellitus with other specified complication: Secondary | ICD-10-CM

## 2016-07-09 DIAGNOSIS — E1122 Type 2 diabetes mellitus with diabetic chronic kidney disease: Secondary | ICD-10-CM | POA: Diagnosis not present

## 2016-07-09 DIAGNOSIS — I110 Hypertensive heart disease with heart failure: Secondary | ICD-10-CM | POA: Diagnosis not present

## 2016-07-09 NOTE — Progress Notes (Signed)
Patient ID: [name], male   DOB: 1951-08-13, 65 y.o.   MRN: [id number]   Provider:  Ok Edwards, NP Location:  [facility] Room Number: 141-A Place of Service:  SNF (31)   PCP: Gildardo Cranker, DO Patient Care Team: Gildardo Cranker, DO as PCP - General (Internal Medicine) Gerlene Fee, NP as Nurse Practitioner (Geriatric Medicine) [facility] ([place])  Extended Emergency Contact Information Primary Emergency Contact: Sneed,Charles Address: [address], [place] [address] [address] [address] [place] Phone: [phone] Mobile Phone: [phone] Relation: Friend  Code Status: Full Code Goals of Care: Advanced Directive information Advanced Directives 07/09/2016  Does patient have an advance directive? No  Copy of advanced directive(s) in chart? -  Would patient like information on creating an advanced directive? No - patient declined information      No Known Allergies   Chief Complaint  Patient presents with  . Annual Exam    Annual    HPI: Patient is a 64 y.o. male seen today for an annual comprehensive examination. His status has remained stable overall this past year. He has not been hospitalized over the past year.  He is doing well overall. He is not voicing any complaints at this time. There are no nursing concerns at this time.    Past Medical History:  Diagnosis Date  . Arthritis   . CHF (congestive heart failure) ([place])   . COPD (chronic obstructive pulmonary disease) ([place])   . Hyperlipidemia   . Hypertension   . Morbid obesity ([place])   . Multiple abrasions    rt arm  . RCT (rotator cuff tear)    Past Surgical History:  Procedure Laterality Date  . 25819    . NO PAST SURGERIES    . SHOULDER ARTHROSCOPY WITH SUBACROMIAL DECOMPRESSION, ROTATOR CUFF REPAIR AND BICEP TENDON REPAIR Right 12/28/2014   Procedure: RIGHT SHOULDER ARTHROSCOPY WITH SUBACROMIAL DECOMPRESSION, DISTAL CLAVICLE RESECTION,  ROTATOR CUFF REPAIR ;  Surgeon: Sydnee Cabal, MD;  Location: [facility] ORS;  Service: Orthopedics;  Laterality: Right;  . TRACHEOSTOMY REVISION N/A 01/18/2015   Procedure: TRACHEOSTOMY REVISION;  Surgeon: Ruby Cola, MD;  [facility];  Service: ENT;  Laterality: N/A;  . TRACHEOSTOMY TUBE PLACEMENT N/A 01/16/2015   Procedure: TRACHEOSTOMY;  Surgeon: Jodi Marble, MD;  Location: [facility];  Service: ENT;  Laterality: N/A;  . TRACHEOSTOMY TUBE PLACEMENT N/A 01/22/2015   Procedure: TRACHEOSTOMY;  Surgeon: Jodi Marble, MD;  Location: [facility] [place];  Service: ENT;  Laterality: N/A;    reports that he has quit smoking. His smoking use included Cigarettes. He smoked 0.20 packs per day. He has quit using smokeless tobacco. He reports that he does not drink alcohol or use drugs. Social History   Social History  . Marital status: Single    Spouse name: N/A  . Number of children: N/A  . Years of education: N/A   Occupational History  . Not on file.   Social History Main Topics  . Smoking status: Former Smoker    Packs/day: 0.20    Types: Cigarettes  . Smokeless tobacco: Former Systems developer  . Alcohol use No  . Drug use: No  . Sexual activity: Not on file   Other Topics Concern  . Not on file   Social History Narrative  . No narrative on file   Family History  Problem Relation Age of Onset  . Heart disease Mother   .  Heart attack Mother   . Heart disease Brother     Vitals:   07/09/16 1422  BP: 140/72  Pulse: 66  Resp: 18  Temp: 98.4 F (36.9 C)  TempSrc: Oral  SpO2: 95%  Weight: 233 lb 5 oz (105.8 kg)  Height: 6' (1.829 m)   Body mass index is 31.64 kg/m.    Medication List       Accurate as of 07/09/16  2:27 PM. Always use your most recent med list.          atorvastatin 20 MG tablet Commonly known as:  LIPITOR Take 20 mg by mouth daily.   clonazePAM 0.5 MG tablet Commonly known as:  KLONOPIN Take 0.25 mg by mouth at bedtime.   diphenhydrAMINE 25 MG tablet Commonly known  as:  BENADRYL Take 25 mg by mouth every 6 (six) hours as needed for itching.   docusate sodium 100 MG capsule Commonly known as:  COLACE Take 1 capsule (100 mg total) by mouth 2 (two) times daily.   DULoxetine 60 MG capsule Commonly known as:  CYMBALTA Take 60 mg by mouth daily.   famotidine 20 MG tablet Commonly known as:  PEPCID Take 20 mg by mouth daily.   Fish Oil 1000 MG Caps Take 1,000 mg by mouth daily.   folic acid 1 MG tablet Commonly known as:  FOLVITE Take 1 mg by mouth daily.   hydrALAZINE 25 MG tablet Commonly known as:  APRESOLINE Give 1.5 mg by mouth two times daily   ipratropium-albuterol 0.5-2.5 (3) MG/3ML Soln Commonly known as:  DUONEB Take 3 mLs by nebulization every 6 (six) hours as needed. For shortness of breath   isosorbide mononitrate 20 MG tablet Commonly known as:  ISMO,MONOKET Take 20 mg by mouth 2 (two) times daily at 10 AM and 5 PM.   lidocaine 5 % Commonly known as:  LIDODERM Place 1 patch onto the skin daily. Remove & Discard patch within 12 hours or as directed by MD To right shoulder   ondansetron 4 MG tablet Commonly known as:  ZOFRAN Take 1 tablet (4 mg total) by mouth every 6 (six) hours as needed for nausea or vomiting.   permethrin 5 % cream Commonly known as:  ELIMITE Apply 1 application topically once. X 14 days   polyethylene glycol packet Commonly known as:  MIRALAX / GLYCOLAX Take 17 g by mouth daily.   POTASSIMIN PO Take 40 mEq by mouth 4 (four) times daily.   rivaroxaban 20 MG Tabs tablet Commonly known as:  XARELTO Take 20 mg by mouth daily with supper.   torsemide 10 MG tablet Commonly known as:  DEMADEX Take 10 mg by mouth daily.   triamterene 50 MG capsule Commonly known as:  DYRENIUM Take 1 capsule (50 mg total) by mouth daily.        SIGNIFICANT DIAGNOSTIC EXAMS  01-01-15: 2-d echo: Left ventricle: The cavity size was normal. There was mild focal basal and mild concentric hypertrophy of the  septum. Systolic function was normal. The estimated ejection fraction was in the range of 60% to 65%. Wall motion was normal; there were no regional wall motion abnormalities. Doppler parameters are consistent with abnormal left ventricular relaxation (grade 1 diastolic dysfunction). There was no evidence of elevate ventricular filling pressure by Doppler parameters. - Aortic valve: There was no regurgitation. - Aortic root: The aortic root was normal in size. - Mitral valve: Structurally normal valve. There was no regurgitation. - Right ventricle: Systolic function  was normal. - Right atrium: The atrium was normal in size. - Tricuspid valve: There was trivial regurgitation. - Pulmonary arteries: Systolic pressure was within the normal range.  01-07-15: renal ultrasound: Limited examination demonstrating no acute abnormality. Negative for Hydronephrosis. Possible nonobstructing stone lower pole left kidney.  01-18-15: IVC filter placement: Successful IVC filter placement. This is temporary or can remain in place to become permanent.  01-28-15: ct of head: 1. No acute intracranial findings demonstrated. Mild atrophy and periventricular white matter disease. 2. Bilateral mastoid effusions with opacification of the left middle ear.  06-17-15: chest x-ray; Cardiomegaly with mild pulmonary vascular congestion  06-19-15: ct guided prostate abscess drain: Fluid collection along the right side of the prostate compatible with an abscess. 30 mL of purulent fluid was removed.  06-24-15: ct of abdomen and pelvis: Fatty infiltration of the liver. Bilateral nonobstructive nephrolithiasis. Atherosclerosis of abdominal aorta without aneurysm formation. Sigmoid diverticulosis is noted without inflammation. Interval placement of transgluteal drainage catheter into right side of the pelvis. Fluid collection noted adjacent to prostate gland on prior exam has been completely decompressed.  06-25-15: kub: Moderate  ileus pattern.    LABS REVIEWED:   07-29-15; INR 2.68  11-11-15: inr 2.26: coumadin 6 mg  12-02-15: INR 2.56 11-17-15: liver normal albumin 3.6; chol 154; ldl 92; trig 101; hdl 42  02-10-16: wbc 9.5; hgb 13.8; hct 40.4; mcv 82.3 ;plt 322; glucose 98; bun 35; creat 1.46; k+ 2.6; na++ 132; alk phos 174; albumin 3.9; chol 155; ldl 83; trig 115; hdl 49  02-13-16: glucose 112; bun 26; creat 1.25; k+ 2.7; na++ 132  02-14-16: wbc 9.4; hgb 14.2; hct 42.1; mcv 81.6; plt 326; glucose 99; bun 27 creat 1.42; k+ 3.0; na++ 133; alk phos 17 ast 33; alt 27; albumin 3.7  02-17-16: glucose 83; bun 31; creat 1.53; k+ 3.2; na++ 136  02-19-16: urine osmolality: 351;  02-20-16: glucose 93; bun 26; creat 1.59; k+ 3.8; na++ 133  02-25-16: glucose 88; bun 16; creat 1.24; k+ 5.0; na++ 138 02-27-16; glucose 101; bun 15; creat 1.16; k+ 4.5; na++ 139  03-02-16: INR 2.9: coumadin 6.5 mg daily  03-23-16: INR 1.8 03-30-16: INR 1.4  04-22-16: glucose 82; bun 23; creat 1.16; k+ 4.2; na++ 139; liver normal albumin 3.7; hgb a1c 4.8 04-23-16: urine micro-albumin 1.3 05-01-16: glucose 79; bun 22; creat 1.20; k+ 4.0; na++ 140;    Review of Systems Constitutional: Negative for appetite change and fatigue.  HENT: Negative for congestion.   Respiratory: Negative for cough, chest tightness and shortness of breath.   Cardiovascular: Negative for chest pain, palpitations and leg swelling.  Gastrointestinal: Negative for nausea, abdominal pain, diarrhea and constipation.  Musculoskeletal: Negative for myalgias and arthralgias.  Skin: Negative for pallor.  Neurological: Negative for dizziness.  Psychiatric/Behavioral: The patient is not nervous/anxious.     Physical Exam Constitutional: He is oriented to person, place, and time. No distress. is obese  Eyes: Conjunctivae are normal.  Neck: Neck supple. No JVD present. No thyromegaly present.  Cardiovascular: Normal rate, regular rhythm and intact distal pulses.   Respiratory: Effort normal  and breath sounds normal. No respiratory distress. He has no wheezes.  Has  trach   GI: Soft. Bowel sounds are normal. He exhibits no distension. There is no tenderness.    Musculoskeletal: no lower extremity edema   Able to move all extremities  Bilateral lower extremities discolored from impaired circulation Lymphadenopathy:    He has no cervical adenopathy.  Neurological: He is  alert and oriented to person, place, and time.  Skin: Skin is warm and dry. He is not diaphoretic.   Psychiatric: He has a normal mood and affect.      ASSESSMENT/ PLAN:  1. Chronic pain: his pain is presently being managed with lidoderm patch to right shoulder; will not make changes will monitor his status   2.  chronic respiratory failure; obesity hypoventilation syndrome: has duoneb every 6 hours as needed;   is presenlty stable is trach for structure support. His trach is slowly being reduced. Will monitor    3. Diastolic heart failure: will continue lasix 20 mg daily; dyrenium 50 mg daily  his zaroxolyn was stopped due to his renal function.   4. Hypokalemia: will continue  k+  40 meq 4 times daily    5. Hypertension: will continue hydralazine 25 mg twice daily  isordil 20 mg twice daily   6. Jerrye Bushy: will continue pepcid 20 mg daily will change the zofran to 4 mg every 6 hours as needed; his reglan as stopped due to tremors; will monitor   7. Constipation: will continue miralax daily colace twice daily   8. Depression with anxiety: will continue cymbalta 60 mg  daily; take klonopin 0.25 mg nightly  for anxiety  9.  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   10. Dyslipidemia: will continue lipitor 20 mg daily and fish oil 1 gm daily ldl 83; trig 115  11. Diabetes: his hgb a1c is 4.8. Is presently not on medications; will not make changes and will monitor his status.   His health maintenance is up to date    Time spent with  patient 45    minutes >50% time spent counseling; reviewing medical record; tests; labs; and developing future plan of care    MD is aware of resident's narcotic use and is in agreement with [facility] plan of care. We will wean dosage as appropriate for resident   Ok Edwards NP [facility] Adult Medicine  Contact [phone] Monday through Friday 8am- 5pm  After hours call [phone]

## 2016-07-15 ENCOUNTER — Ambulatory Visit (HOSPITAL_COMMUNITY): Payer: Self-pay

## 2016-07-18 ENCOUNTER — Emergency Department (HOSPITAL_COMMUNITY)
Admission: EM | Admit: 2016-07-18 | Discharge: 2016-07-18 | Disposition: A | Discharge status: Skilled Nursing Facility | Payer: BLUE CROSS/BLUE SHIELD | Attending: Emergency Medicine | Admitting: Emergency Medicine

## 2016-07-18 ENCOUNTER — Encounter (HOSPITAL_COMMUNITY): Payer: Self-pay | Admitting: Emergency Medicine

## 2016-07-18 DIAGNOSIS — J9500 Unspecified tracheostomy complication: Secondary | ICD-10-CM | POA: Insufficient documentation

## 2016-07-18 DIAGNOSIS — Z79899 Other long term (current) drug therapy: Secondary | ICD-10-CM | POA: Insufficient documentation

## 2016-07-18 DIAGNOSIS — I110 Hypertensive heart disease with heart failure: Secondary | ICD-10-CM | POA: Diagnosis not present

## 2016-07-18 DIAGNOSIS — Z87891 Personal history of nicotine dependence: Secondary | ICD-10-CM | POA: Insufficient documentation

## 2016-07-18 DIAGNOSIS — Z430 Encounter for attention to tracheostomy: Secondary | ICD-10-CM

## 2016-07-18 DIAGNOSIS — I5032 Chronic diastolic (congestive) heart failure: Secondary | ICD-10-CM | POA: Insufficient documentation

## 2016-07-18 DIAGNOSIS — J449 Chronic obstructive pulmonary disease, unspecified: Secondary | ICD-10-CM | POA: Diagnosis not present

## 2016-07-18 DIAGNOSIS — Z7901 Long term (current) use of anticoagulants: Secondary | ICD-10-CM | POA: Diagnosis not present

## 2016-07-18 DIAGNOSIS — E119 Type 2 diabetes mellitus without complications: Secondary | ICD-10-CM | POA: Diagnosis not present

## 2016-07-18 NOTE — Procedures (Signed)
Pt arrived by EMS with trach out. Pt taken to room. This RT inserted a shiley size 4 cuffed.deflated with no distress noted. Trach secured CO2 positive color change. Pt Sats 97% at this time and in no distress.

## 2016-07-18 NOTE — ED Notes (Signed)
Patient left at this time with all belongings. 

## 2016-07-18 NOTE — ED Provider Notes (Signed)
[facility]-EMERGENCY DEPT Provider Note   CSN: [id number] Arrival date & time: 07/18/16  0534     History   Chief Complaint No chief complaint on file.   HPI [name] is a 65 y.o. male.  HPI Patient is coming from rehab by EMS after trach was displaced earlier this evening. States trach has been in place for greater than 2 years. Unable to replace it by rehabilitation staff. Denies any other symptoms. Denies fever or chills. Denies chest pain or shortness of breath. Past Medical History:  Diagnosis Date  . Arthritis   . CHF (congestive heart failure) (HCC)   . COPD (chronic obstructive pulmonary disease) (HCC)   . Hyperlipidemia   . Hypertension   . Morbid obesity (HCC)   . Multiple abrasions    rt arm  . RCT (rotator cuff tear)     Patient Active Problem List   Diagnosis Date Noted  . OSA (obstructive sleep apnea)   . Scabies infestation 06/20/2016  . Dyslipidemia associated with type 2 diabetes mellitus (HCC) 04/20/2016  . DM (diabetes mellitus) type II controlled with renal manifestation (HCC) 04/20/2016  . Anticoagulation management encounter 04/18/2016  . Tremor of both hands 02/29/2016  . GERD without esophagitis 02/29/2016  . Chronic renal failure, stage 2 (mild) 02/17/2016  . Presence of IVC filter 01/06/2016  . Hypertensive heart disease with CHF (congestive heart failure) (HCC) 12/27/2015  . Dyslipidemia 11/10/2015  . Depression with anxiety 09/02/2015  . Chronic diastolic congestive heart failure (HCC) 04/22/2015  . Hypokalemia 04/22/2015  . Chronic respiratory failure with hypoxia (HCC)   . Tracheostomy status (HCC)   . Obesity hypoventilation syndrome (HCC) 01/20/2015  . Chronic deep vein thrombosis (DVT) (HCC)   . Tracheostomy care (HCC)   . BMI 50.0-59.9, adult (HCC) 11/29/2014    Past Surgical History:  Procedure Laterality Date  . 40981    . NO PAST SURGERIES    . SHOULDER ARTHROSCOPY WITH SUBACROMIAL DECOMPRESSION, ROTATOR CUFF REPAIR AND  BICEP TENDON REPAIR Right 12/28/2014   Procedure: RIGHT SHOULDER ARTHROSCOPY WITH SUBACROMIAL DECOMPRESSION, DISTAL CLAVICLE RESECTION, ROTATOR CUFF REPAIR ;  Surgeon: Eugenia Mcalpine, MD;  Location: [facility];  Service: Orthopedics;  Laterality: Right;  . TRACHEOSTOMY REVISION N/A 01/18/2015   Procedure: TRACHEOSTOMY REVISION;  Surgeon: Melvenia Beam, MD;  [facility] OR;  Service: ENT;  Laterality: N/A;  . TRACHEOSTOMY TUBE PLACEMENT N/A 01/16/2015   Procedure: TRACHEOSTOMY;  Surgeon: Flo Shanks, MD;  Location: [facility] OR;  Service: ENT;  Laterality: N/A;  . TRACHEOSTOMY TUBE PLACEMENT N/A 01/22/2015   Procedure: TRACHEOSTOMY;  Surgeon: Flo Shanks, MD;  Location: [facility] OR;  Service: ENT;  Laterality: N/A;       Home Medications    Prior to Admission medications   Medication Sig Start Date End Date Taking? Authorizing Provider  atorvastatin (LIPITOR) 20 MG tablet Take 20 mg by mouth daily.    Historical Provider, MD  clonazePAM (KLONOPIN) 0.5 MG tablet Take 0.25 mg by mouth at bedtime.     Historical Provider, MD  diphenhydrAMINE (BENADRYL) 25 MG tablet Take 25 mg by mouth every 6 (six) hours as needed for itching.    Historical Provider, MD  docusate sodium (COLACE) 100 MG capsule Take 1 capsule (100 mg total) by mouth 2 (two) times daily. 06/26/15   Dorothea Ogle, MD  DULoxetine (CYMBALTA) 60 MG capsule Take 60 mg by mouth daily.    Historical Provider, MD  famotidine (PEPCID) 20 MG tablet Take 20 mg by mouth daily.  Historical Provider, MD  folic acid (FOLVITE) 1 MG tablet Take 1 mg by mouth daily.    Historical Provider, MD  hydrALAZINE (APRESOLINE) 25 MG tablet Give 1.5 mg by mouth two times daily    Historical Provider, MD  ipratropium-albuterol (DUONEB) 0.5-2.5 (3) MG/3ML SOLN Take 3 mLs by nebulization every 6 (six) hours as needed. For shortness of breath    Historical Provider, MD  isosorbide mononitrate (ISMO,MONOKET) 20 MG tablet Take 20 mg by mouth 2 (two) times daily at 10 AM and 5  PM.    Historical Provider, MD  lidocaine (LIDODERM) 5 % Place 1 patch onto the skin daily. Remove & Discard patch within 12 hours or as directed by MD To right shoulder    Historical Provider, MD  Omega-3 Fatty Acids (FISH OIL) 1000 MG CAPS Take 1,000 mg by mouth daily.     Historical Provider, MD  ondansetron (ZOFRAN) 4 MG tablet Take 1 tablet (4 mg total) by mouth every 6 (six) hours as needed for nausea or vomiting. 03/16/16   Sharee Holstereborah S Green, NP  permethrin (ELIMITE) 5 % cream Apply 1 application topically once. X 14 days    Historical Provider, MD  polyethylene glycol (MIRALAX / GLYCOLAX) packet Take 17 g by mouth daily. 06/26/15   Dorothea OgleIskra M Myers, MD  Potassium (POTASSIMIN PO) Take 40 mEq by mouth 4 (four) times daily.     Historical Provider, MD  rivaroxaban (XARELTO) 20 MG TABS tablet Take 20 mg by mouth daily with supper.    Historical Provider, MD  torsemide (DEMADEX) 10 MG tablet Take 10 mg by mouth daily.    Historical Provider, MD  triamterene (DYRENIUM) 50 MG capsule Take 1 capsule (50 mg total) by mouth daily. 02/17/16   Sharee Holstereborah S Green, NP    Family History Family History  Problem Relation Age of Onset  . Heart disease Mother   . Heart attack Mother   . Heart disease Brother     Social History Social History  Substance Use Topics  . Smoking status: Former Smoker    Packs/day: 0.20    Types: Cigarettes  . Smokeless tobacco: Former NeurosurgeonUser  . Alcohol use No     Allergies   Patient has no known allergies.   Review of Systems Review of Systems  Constitutional: Negative for chills and fever.  Respiratory: Negative for shortness of breath and wheezing.   Cardiovascular: Negative for chest pain, palpitations and leg swelling.  Gastrointestinal: Negative for abdominal pain, nausea and vomiting.  Musculoskeletal: Negative for back pain and neck pain.  Skin: Negative for rash and wound.  Neurological: Negative for dizziness, weakness, light-headedness, numbness and  headaches.  All other systems reviewed and are negative.    Physical Exam Updated Vital Signs BP 148/89 (BP Location: Left Arm)   Pulse 69   Temp 97.6 F (36.4 C) (Oral)   Resp 22   Ht 6' (1.829 m)   Wt 217 lb (98.4 kg)   SpO2 94%   BMI 29.43 kg/m   Physical Exam  Constitutional: He is oriented to person, place, and time. He appears well-developed and well-nourished. No distress.  HENT:  Head: Normocephalic and atraumatic.  Mouth/Throat: Oropharynx is clear and moist.  Eyes: EOM are normal. Pupils are equal, round, and reactive to light.  Neck: Normal range of motion. Neck supple.  Trach stoma with no signs of trauma. No active bleeding.  Cardiovascular: Normal rate and regular rhythm.   Pulmonary/Chest: Effort normal and breath sounds  normal. No respiratory distress. He has no wheezes. He has no rales. He exhibits no tenderness.  Abdominal: Soft. Bowel sounds are normal. There is no tenderness. There is no rebound and no guarding.  Musculoskeletal: Normal range of motion. He exhibits no edema or tenderness.  Neurological: He is alert and oriented to person, place, and time.  Skin: Skin is warm and dry. Capillary refill takes less than 2 seconds. No rash noted. No erythema.  Psychiatric: He has a normal mood and affect. His behavior is normal.  Nursing note and vitals reviewed.    ED Treatments / Results  Labs (all labs ordered are listed, but only abnormal results are displayed) Labs Reviewed - No data to display  EKG  EKG Interpretation None       Radiology No results found.  Procedures Procedures (including critical care time)  Medications Ordered in ED Medications - No data to display   Initial Impression / Assessment and Plan / ED Course  I have reviewed the triage vital signs and the nursing notes.  Pertinent labs & imaging results that were available during my care of the patient were reviewed by me and considered in my medical decision making (see  chart for details).  Clinical Course    Trach replaced by respiratory therapist. Secured in place. We'll discharge home to follow-up with his primary physician. Return precautions given.  Final Clinical Impressions(s) / ED Diagnoses   Final diagnoses:  Acute tracheostomy management [facility](HCC)    New Prescriptions New Prescriptions   No medications on file     Loren Raceravid Taniela Feltus, MD 07/18/16 [phone]

## 2016-07-18 NOTE — ED Triage Notes (Signed)
Pt [place] [facility] EMS [facility] [place], reports trach fell out while he was sleeping. Appears comfortable, alert and oriented, ambulatory. Says this happens a lot. Resp tech at bedside

## 2016-07-21 ENCOUNTER — Encounter: Payer: Self-pay | Admitting: Internal Medicine

## 2016-07-21 ENCOUNTER — Non-Acute Institutional Stay: Payer: BLUE CROSS/BLUE SHIELD | Admitting: Internal Medicine

## 2016-07-21 DIAGNOSIS — J9611 Chronic respiratory failure with hypoxia: Secondary | ICD-10-CM | POA: Diagnosis not present

## 2016-07-21 DIAGNOSIS — Z930 Tracheostomy status: Secondary | ICD-10-CM

## 2016-07-21 DIAGNOSIS — I5032 Chronic diastolic (congestive) heart failure: Secondary | ICD-10-CM

## 2016-07-21 NOTE — Progress Notes (Signed)
Patient ID: [name], male   DOB: November 10, 1950, 65 y.o.   MRN: [id number]    DATE:  07/21/2016  [facility] Room Number: 141 A Place of Service: SNF (31)   Extended Emergency Contact Information Primary Emergency Contact: [name] Address: [address]          [place], [place] [address] [name] Home Phone: [phone] Mobile Phone: [phone] Relation: Friend  Advanced Directive information Does patient have an advance directive?: No, Would patient like information on creating an advanced directive?: No - patient declined information  Chief Complaint  Patient presents with  . Acute Visit    trach issues    HPI:  65 yo male long term resident seen today for trach concerns. Per nursing and pt, his trach is siting high above insertion site. He c/o that it is irritating him. No bleeding. No difficulty breathing. No other concerns. No falls. No f/c.  Chronic pain syndrome/right shoulder - stable on lidoderm patch to right shoulder  chronic respiratory failure/obesity hypoventilation syndrome - s/p trach; stable on duoneb every 6 hours as needed; His trach is slowly being reduced by ENT  Diastolic heart failure - stable on lasix 20 mg daily; dyrenium 50 mg daily; zaroxolyn was stopped due to his renal function.   Hypokalemia - stable on k+  40 meq 4 times daily    Hypertension - stable on hydralazine 25 mg twice daily;  isordil 20 mg twice daily   GERD - stable on pepcid 20 mg daily; zofran 4 mg every 6 hours as needed; his reglan as stopped due to tremors;  Constipation - stable on miralax daily; colace twice daily   Depression with anxiety - stable on cymbalta 60 mg  daily; take klonopin 0.25 mg nightly  for anxiety  Hx Bilateral DVT - s/p  IVC filter; is on xarelto 20 mg daily   Hyperlipidemia - stable on  lipitor 20 mg daily and fish oil 1 gm daily. LDL 83; trig 115  DM - diet controlled. A1c 4.8%  Past Medical History:    Diagnosis Date  . Arthritis   . CHF (congestive heart failure) (HCC)   . COPD (chronic obstructive pulmonary disease) (HCC)   . Hyperlipidemia   . Hypertension   . Morbid obesity (HCC)   . Multiple abrasions    rt arm  . RCT (rotator cuff tear)     Past Surgical History:  Procedure Laterality Date  . [id number]    . NO PAST SURGERIES    . SHOULDER ARTHROSCOPY WITH SUBACROMIAL DECOMPRESSION, ROTATOR CUFF REPAIR AND BICEP TENDON REPAIR Right 12/28/2014   Procedure: RIGHT SHOULDER ARTHROSCOPY WITH SUBACROMIAL DECOMPRESSION, DISTAL CLAVICLE RESECTION, ROTATOR CUFF REPAIR ;  Surgeon: Eugenia Mcalpineobert Collins, MD;  Location: [facility];  Service: Orthopedics;  Laterality: Right;  . TRACHEOSTOMY REVISION N/A 01/18/2015   Procedure: TRACHEOSTOMY REVISION;  Surgeon: Melvenia BeamMitchell [name], MD;  [facility];  Service: ENT;  Laterality: N/A;  . TRACHEOSTOMY TUBE PLACEMENT N/A 01/16/2015   Procedure: TRACHEOSTOMY;  Surgeon: Flo ShanksKarol Wolicki, MD;  [facility];  Service: ENT;  Laterality: N/A;  . TRACHEOSTOMY TUBE PLACEMENT N/A 01/22/2015   Procedure: TRACHEOSTOMY;  Surgeon: Flo ShanksKarol Wolicki, MD;  [facility];  Service: ENT;  Laterality: N/A;    Patient Care Team: Kirt BoysMonica Aadhira Heffernan, DO as PCP - General (Internal Medicine) Nicholas Holstereborah S Green, NP as Nurse Practitioner (Geriatric Medicine) [facility]  Social History   Social History  .  Marital status: Single    Spouse name: N/A  . Number of children: N/A  . Years of education: N/A   Occupational History  . Not on file.   Social History Main Topics  . Smoking status: Former Smoker    Packs/day: 0.20    Types: Cigarettes  . Smokeless tobacco: Former NeurosurgeonUser  . Alcohol use No  . Drug use: No  . Sexual activity: Not on file   Other Topics Concern  . Not on file   Social History Narrative  . No narrative on file     reports that he has quit smoking. His smoking use included Cigarettes. He smoked 0.20 packs per day. He has  quit using smokeless tobacco. He reports that he does not drink alcohol or use drugs.  Family History  Problem Relation Age of Onset  . Heart disease Mother   . Heart attack Mother   . Heart disease Brother    Family Status  Relation Status  . Mother Deceased  . Father Deceased  . Sister Alive  . Brother Alive  . Brother Deceased    Immunization History  Administered Date(s) Administered  . Pneumococcal Polysaccharide-23 01/14/2015    No Known Allergies  Medications: Patient's Medications  New Prescriptions   No medications on file  Previous Medications   ATORVASTATIN (LIPITOR) 20 MG TABLET    Take 20 mg by mouth daily.   CLONAZEPAM (KLONOPIN) 0.5 MG TABLET    Take 0.25 mg by mouth at bedtime.    DIPHENHYDRAMINE (BENADRYL) 25 MG TABLET    Take 25 mg by mouth every 6 (six) hours as needed for itching.   DOCUSATE SODIUM (COLACE) 100 MG CAPSULE    Take 1 capsule (100 mg total) by mouth 2 (two) times daily.   DULOXETINE (CYMBALTA) 60 MG CAPSULE    Take 60 mg by mouth daily.   FAMOTIDINE (PEPCID) 20 MG TABLET    Take 20 mg by mouth daily.   FOLIC ACID (FOLVITE) 1 MG TABLET    Take 1 mg by mouth daily.   HYDRALAZINE (APRESOLINE) 25 MG TABLET    Give 1.5 mg by mouth two times daily   IPRATROPIUM-ALBUTEROL (DUONEB) 0.5-2.5 (3) MG/3ML SOLN    Take 3 mLs by nebulization every 6 (six) hours as needed. For shortness of breath   ISOSORBIDE MONONITRATE (ISMO,MONOKET) 20 MG TABLET    Take 20 mg by mouth 2 (two) times daily at 10 AM and 5 PM.   LIDOCAINE (LIDODERM) 5 %    Place 1 patch onto the skin daily. Remove & Discard patch within 12 hours or as directed by MD To right shoulder   OMEGA-3 FATTY ACIDS (FISH OIL) 1000 MG CAPS    Take 1,000 mg by mouth daily.    ONDANSETRON (ZOFRAN) 4 MG TABLET    Take 1 tablet (4 mg total) by mouth every 6 (six) hours as needed for nausea or vomiting.   POLYETHYLENE GLYCOL (MIRALAX / GLYCOLAX) PACKET    Take 17 g by mouth daily.   POTASSIUM (POTASSIMIN  PO)    Take 40 mEq by mouth 4 (four) times daily.    RIVAROXABAN (XARELTO) 20 MG TABS TABLET    Take 20 mg by mouth daily with supper.   TORSEMIDE (DEMADEX) 10 MG TABLET    Take 10 mg by mouth daily.   TRIAMTERENE (DYRENIUM) 50 MG CAPSULE    Take 1 capsule (50 mg total) by mouth daily.  Modified Medications   No medications  on file  Discontinued Medications   PERMETHRIN (ELIMITE) 5 % CREAM    Apply 1 application topically once. X 14 days    Review of Systems  Musculoskeletal: Positive for arthralgias.  All other systems reviewed and are negative.   Vitals:   07/21/16 1233  BP: (!) 141/91  Pulse: 74  Resp: 20  Temp: 98 F (36.7 C)  TempSrc: Oral  SpO2: 95%  Weight: 234 lb 1.6 oz (106.2 kg)  Height: 6' (1.829 m)   Body mass index is 31.75 kg/m.  Physical Exam  Constitutional: He is oriented to person, place, and time. He appears well-developed and well-nourished.  HENT:  Mouth/Throat: Oropharynx is clear and moist. No oropharyngeal exudate.  Neck: Neck supple.  Trach not sitting against skin but sitting about 1-2 in above the skin; no bleeding, leakage or palpable subcut air  Cardiovascular: Normal rate, regular rhythm, normal heart sounds and intact distal pulses.  Exam reveals no gallop and no friction rub.   No murmur heard. Pulmonary/Chest: No respiratory distress. He has no wheezes. He has no rales.  Musculoskeletal: He exhibits edema.  Lymphadenopathy:    He has no cervical adenopathy.  Neurological: He is alert and oriented to person, place, and time.  Skin: Skin is warm and dry. No rash noted.  Psychiatric: He has a normal mood and affect. His behavior is normal. Thought content normal.  Vitals reviewed.    Labs reviewed: Nursing Home on 06/01/2016  Component Date Value Ref Range Status  . Microalb, Ur 05/01/2016 1.3   Final  . Glucose 05/01/2016 79  mg/dL Final  . BUN 16/06/9603 22* 4 - 21 mg/dL Final  . Creatinine 54/05/8118 1.2  0.6 - 1.3 mg/dL Final    . Potassium 14/78/2956 4.0  3.4 - 5.3 mmol/L Final  . Sodium 05/01/2016 140  137 - 147 mmol/L Final  . Alkaline Phosphatase 04/22/2016 115  25 - 125 U/L Final  . ALT 04/22/2016 14  10 - 40 U/L Final  . AST 04/22/2016 15  14 - 40 U/L Final  . Bilirubin, Total 04/22/2016 0.8  mg/dL Final  . Hemoglobin O1H 04/22/2016 4.8   Final    No results found.   Assessment/Plan   ICD-9-CM ICD-10-CM   1. Tracheostomy in place [facility]) V44.0 Z93.0   2. Chronic respiratory failure with hypoxia (HCC) 518.83 J96.11    799.02    3. Chronic diastolic congestive heart failure (HCC) 428.32 I50.32    428.0      Check plain film xray of trach site to determine if dislodged  F/u with trach clinic vs ED visit pending xray results  Follow VS  Will follow  Damarkus Balis S. Ancil Linsey  [facility] [address] [place], [place] [address] [phone] Cell (Monday-Friday 8 AM - 5 PM) [phone] After 5 PM and follow prompts

## 2016-07-22 ENCOUNTER — Ambulatory Visit (HOSPITAL_COMMUNITY)
Admission: RE | Admit: 2016-07-22 | Discharge: 2016-07-22 | Disposition: A | Discharge status: Home / Self Care | Payer: BLUE CROSS/BLUE SHIELD | Source: Ambulatory Visit | Attending: Acute Care | Admitting: Acute Care

## 2016-07-22 DIAGNOSIS — Z4682 Encounter for fitting and adjustment of non-vascular catheter: Secondary | ICD-10-CM | POA: Diagnosis not present

## 2016-07-22 DIAGNOSIS — I5032 Chronic diastolic (congestive) heart failure: Secondary | ICD-10-CM | POA: Diagnosis not present

## 2016-07-22 DIAGNOSIS — G4731 Primary central sleep apnea: Secondary | ICD-10-CM | POA: Insufficient documentation

## 2016-07-22 DIAGNOSIS — G4733 Obstructive sleep apnea (adult) (pediatric): Secondary | ICD-10-CM | POA: Insufficient documentation

## 2016-07-22 DIAGNOSIS — Z930 Tracheostomy status: Secondary | ICD-10-CM | POA: Diagnosis present

## 2016-07-22 NOTE — Progress Notes (Signed)
Tracheostomy Procedure Note  [name] [id number] [birth year]  Pre Procedure Tracheostomy Information  Trach Brand: Shiley Size: 4.0 Style: Cuffed Secured by: Velcro   Procedure: changed trach to 5.0 bivona uncuffed    Post Procedure Tracheostomy Information  Trach Brand: bivona cuffless Size: 5.0 Style: Uncuffed Secured by: Velcro   Post Procedure Evaluation:  ETCO2 positive color change from yellow to purple : Yes.   Vital signs:blood pressure 108/72, pulse 77, respirations 20 and pulse oximetry 97 % [name] condition: stable Complications: No apparent complications Trach site exam: clean, dry Wound care done: dry Patient did tolerate procedure well.   Education: none  Prescription needs: none    Additional needs: none

## 2016-07-22 NOTE — Progress Notes (Signed)
    Subjective:  Scheduled ROV   Patient ID: [name], male    DOB: 05-Mar-1951, 65 y.o.   MRN: [id number]  HPI  Mr Lape returns today for planned trach change given plan to keep trach indefinitely. Since our last visit he did go to the ER after his trach dislodged. This was replaced w/ a #4 cuffed. On evaluation his phonation was much more turbulent than baseline   Review of Systems  Constitutional: Negative.  Negative for activity change.  HENT: Positive for congestion and postnasal drip. Negative for dental problem.   Eyes: Negative.   Respiratory: Negative for apnea, cough, chest tightness and shortness of breath.   Cardiovascular: Negative.   Gastrointestinal: Negative.   Endocrine: Negative.   Genitourinary: Negative.   Musculoskeletal: Negative.   Neurological: Negative.   Psychiatric/Behavioral: Negative.      Objective:   Physical Exam  Constitutional: He is oriented to person, place, and time. He appears well-nourished.  HENT:  Head: Normocephalic.  Mouth/Throat: No oropharyngeal exudate.  Eyes: EOM are normal. Pupils are equal, round, and reactive to light.  Neck: Normal range of motion.    Cardiovascular: Normal rate, regular rhythm, S1 normal, S2 normal and normal pulses.  Exam reveals no distant heart sounds and no friction rub.   Pulmonary/Chest: Effort normal and breath sounds normal. No accessory muscle usage. No respiratory distress.  Abdominal: Soft. Normal appearance and bowel sounds are normal. There is no tenderness.  Musculoskeletal: Normal range of motion.       Right shoulder: He exhibits no tenderness, no bony tenderness, no swelling and no deformity.  Neurological: He is oriented to person, place, and time. He has normal strength. GCS eye subscore is 4. GCS verbal subscore is 5. GCS motor subscore is 6.  Psychiatric: He has a normal mood and affect. His speech is normal and behavior is normal. Thought content normal.   Procedure:  Trach  Brand: bivona cuffless Size: 5.0 Style: Uncuffed Secured by: Velcro  Comment:  Did have some difficult passing the trach initially. Met small of resistance met, then easily passed. Given the trauma noted and the fact that the trach was attempted to place w/out obturator     Assessment & Plan:  Tracheostomy dependence  OSA and Central Sleep Apnea (severe as assessed by PSG on 7/27) In-tolerant of CPAP  Chronic Diastolic Heart Failure  Bilateral DVT ->on xarelto  -->not a candidate for decannulation given inability to tolerate CPAP  Plan Cont routine trach care ROV 3 months   Erick Colace ACNP-BC [place] Pager # [phone] OR # [phone] if no answer

## 2016-07-22 NOTE — Discharge Planning (Addendum)
     1)  Continue routine skin care at trach site 2)  Do not remove trach 3)  suction as needed.  4)  call IF any issues w/ trach 5)  GO TO ER IF Trach ever removed! Be sure to bring new trach with you  6)   See you in 3 month  7)   You would benefit from nasal steroid for your post-nasal gtt.   Please ask your medical provider about: nasonex 2 puffs daily Or equivalent. This will help w/ your post-nasal gtt and cut down on trach secretions    Call if needed [phone]  Simonne MartinetPeter E Jennice Renegar ACNP-BC [facility]

## 2016-07-23 ENCOUNTER — Encounter: Payer: Self-pay | Admitting: Adult Health

## 2016-07-23 ENCOUNTER — Non-Acute Institutional Stay: Payer: BLUE CROSS/BLUE SHIELD | Admitting: Adult Health

## 2016-07-23 DIAGNOSIS — L97919 Non-pressure chronic ulcer of unspecified part of right lower leg with unspecified severity: Principal | ICD-10-CM

## 2016-07-23 DIAGNOSIS — I83019 Varicose veins of right lower extremity with ulcer of unspecified site: Secondary | ICD-10-CM

## 2016-07-23 NOTE — Progress Notes (Signed)
Patient ID: [name], male   DOB: [birth year], 65 y.o.   MRN: [id number]  Location:    [place] Room Number: 141-A Place of Service:  SNF (31)   CODE STATUS: Full Code  No Known Allergies  Chief Complaint  Patient presents with  . Acute Visit    HPI:  I have been asked to see him regarding regarding his lower extremity venous ulcerations. There areas are nearly resolved. There are no signs of infection present. There are no reports of fever present.   Past Medical History:  Diagnosis Date  . Arthritis   . CHF (congestive heart failure) ([place])   . COPD (chronic obstructive pulmonary disease) ([place])   . Hyperlipidemia   . Hypertension   . Morbid obesity ([place])   . Multiple abrasions    rt arm  . RCT (rotator cuff tear)     Past Surgical History:  Procedure Laterality Date  . 25819    . NO PAST SURGERIES    . SHOULDER ARTHROSCOPY WITH SUBACROMIAL DECOMPRESSION, ROTATOR CUFF REPAIR AND BICEP TENDON REPAIR Right 12/28/2014   Procedure: RIGHT SHOULDER ARTHROSCOPY WITH SUBACROMIAL DECOMPRESSION, DISTAL CLAVICLE RESECTION, ROTATOR CUFF REPAIR ;  Surgeon: Sydnee Cabal, MD;  Location: [facility];  Service: Orthopedics;  Laterality: Right;  . TRACHEOSTOMY REVISION N/A 01/18/2015   Procedure: TRACHEOSTOMY REVISION;  Surgeon: Ruby Cola, MD;  Location: [facility] OR;  Service: ENT;  Laterality: N/A;  . TRACHEOSTOMY TUBE PLACEMENT N/A 01/16/2015   Procedure: TRACHEOSTOMY;  Surgeon: Jodi Marble, MD;  Location: [place];  Service: ENT;  Laterality: N/A;  . TRACHEOSTOMY TUBE PLACEMENT N/A 01/22/2015   Procedure: TRACHEOSTOMY;  Surgeon: Jodi Marble, MD;  Location: [place];  Service: ENT;  Laterality: N/A;    Social History   Social History  . Marital status: Single    Spouse name: N/A  . Number of children: N/A  . Years of education: N/A   Occupational History  . Not on file.   Social History Main Topics  . Smoking status: Former Smoker    Packs/day: 0.20    Types:  Cigarettes  . Smokeless tobacco: Former Systems developer  . Alcohol use No  . Drug use: No  . Sexual activity: Not on file   Other Topics Concern  . Not on file   Social History Narrative  . No narrative on file   Family History  Problem Relation Age of Onset  . Heart disease Mother   . Heart attack Mother   . Heart disease Brother       VITAL SIGNS BP 109/67   Pulse 71   Temp 98.3 F (36.8 C) (Oral)   Resp 18   Ht 6' (1.829 m)   Wt 234 lb (106.1 kg)   SpO2 96%   BMI 31.74 kg/m   Patient's Medications  New Prescriptions   No medications on file  Previous Medications   ATORVASTATIN (LIPITOR) 20 MG TABLET    Take 20 mg by mouth daily.   CLONAZEPAM (KLONOPIN) 0.5 MG TABLET    Take 0.25 mg by mouth at bedtime.    DIPHENHYDRAMINE (BENADRYL) 25 MG TABLET    Take 25 mg by mouth every 6 (six) hours as needed for itching.   DOCUSATE SODIUM (COLACE) 100 MG CAPSULE    Take 1 capsule (100 mg total) by mouth 2 (two) times daily.   DULOXETINE (CYMBALTA) 60 MG CAPSULE    Take 60 mg by mouth daily.   FAMOTIDINE (PEPCID) 20 MG TABLET  Take 20 mg by mouth daily.   FOLIC ACID (FOLVITE) 1 MG TABLET    Take 1 mg by mouth daily.   HYDRALAZINE (APRESOLINE) 25 MG TABLET    Give 1.5 mg by mouth two times daily   IPRATROPIUM-ALBUTEROL (DUONEB) 0.5-2.5 (3) MG/3ML SOLN    Take 3 mLs by nebulization every 6 (six) hours as needed. For shortness of breath   ISOSORBIDE MONONITRATE (ISMO,MONOKET) 20 MG TABLET    Take 20 mg by mouth 2 (two) times daily at 10 AM and 5 PM.   LIDOCAINE (LIDODERM) 5 %    Place 1 patch onto the skin daily. Remove & Discard patch within 12 hours or as directed by MD To right shoulder   MOMETASONE (NASONEX) 50 MCG/ACT NASAL SPRAY    2 sprays daily.   OMEGA-3 FATTY ACIDS (FISH OIL) 1000 MG CAPS    Take 1,000 mg by mouth daily.    ONDANSETRON (ZOFRAN) 4 MG TABLET    Take 1 tablet (4 mg total) by mouth every 6 (six) hours as needed for nausea or vomiting.   POLYETHYLENE GLYCOL  (MIRALAX / GLYCOLAX) PACKET    Take 17 g by mouth daily.   POTASSIUM (POTASSIMIN PO)    Take 40 mEq by mouth 4 (four) times daily.    RIVAROXABAN (XARELTO) 20 MG TABS TABLET    Take 20 mg by mouth daily with supper.   TORSEMIDE (DEMADEX) 10 MG TABLET    Take 10 mg by mouth daily.   TRIAMTERENE (DYRENIUM) 50 MG CAPSULE    Take 1 capsule (50 mg total) by mouth daily.  Modified Medications   No medications on file  Discontinued Medications   No medications on file     SIGNIFICANT DIAGNOSTIC EXAMS  01-01-15: 2-d echo: Left ventricle: The cavity size was normal. There was mild focal basal and mild concentric hypertrophy of the septum. Systolic function was normal. The estimated ejection fraction was in the range of 60% to 65%. Wall motion was normal; there were no regional wall motion abnormalities. Doppler parameters are consistent with abnormal left ventricular relaxation (grade 1 diastolic dysfunction). There was no evidence of elevate ventricular filling pressure by Doppler parameters. - Aortic valve: There was no regurgitation. - Aortic root: The aortic root was normal in size. - Mitral valve: Structurally normal valve. There was no regurgitation. - Right ventricle: Systolic function was normal. - Right atrium: The atrium was normal in size. - Tricuspid valve: There was trivial regurgitation. - Pulmonary arteries: Systolic pressure was within the normal range.  01-07-15: renal ultrasound: Limited examination demonstrating no acute abnormality. Negative for Hydronephrosis. Possible nonobstructing stone lower pole left kidney.  01-18-15: IVC filter placement: Successful IVC filter placement. This is temporary or can remain in place to become permanent.  01-28-15: ct of head: 1. No acute intracranial findings demonstrated. Mild atrophy and periventricular white matter disease. 2. Bilateral mastoid effusions with opacification of the left middle ear.  06-17-15: chest x-ray; Cardiomegaly with  mild pulmonary vascular congestion  06-19-15: ct guided prostate abscess drain: Fluid collection along the right side of the prostate compatible with an abscess. 30 mL of purulent fluid was removed.  06-24-15: ct of abdomen and pelvis: Fatty infiltration of the liver. Bilateral nonobstructive nephrolithiasis. Atherosclerosis of abdominal aorta without aneurysm formation. Sigmoid diverticulosis is noted without inflammation. Interval placement of transgluteal drainage catheter into right side of the pelvis. Fluid collection noted adjacent to prostate gland on prior exam has been completely decompressed.  06-25-15: kub:  Moderate ileus pattern.    LABS REVIEWED:   07-29-15; INR 2.68  11-11-15: inr 2.26: coumadin 6 mg  12-02-15: INR 2.56 11-17-15: liver normal albumin 3.6; chol 154; ldl 92; trig 101; hdl 42  02-10-16: wbc 9.5; hgb 13.8; hct 40.4; mcv 82.3 ;plt 322; glucose 98; bun 35; creat 1.46; k+ 2.6; na++ 132; alk phos 174; albumin 3.9; chol 155; ldl 83; trig 115; hdl 49  02-13-16: glucose 112; bun 26; creat 1.25; k+ 2.7; na++ 132  02-14-16: wbc 9.4; hgb 14.2; hct 42.1; mcv 81.6; plt 326; glucose 99; bun 27 creat 1.42; k+ 3.0; na++ 133; alk phos 17 ast 33; alt 27; albumin 3.7  02-17-16: glucose 83; bun 31; creat 1.53; k+ 3.2; na++ 136  02-19-16: urine osmolality: 351;  02-20-16: glucose 93; bun 26; creat 1.59; k+ 3.8; na++ 133  02-25-16: glucose 88; bun 16; creat 1.24; k+ 5.0; na++ 138 02-27-16; glucose 101; bun 15; creat 1.16; k+ 4.5; na++ 139  03-02-16: INR 2.9: coumadin 6.5 mg daily  03-23-16: INR 1.8 03-30-16: INR 1.4  04-22-16: glucose 82; bun 23; creat 1.16; k+ 4.2; na++ 139; liver normal albumin 3.7; hgb a1c 4.8 04-23-16: urine micro-albumin 1.3 05-01-16: glucose 79; bun 22; creat 1.20; k+ 4.0; na++ 140;    Review of Systems Constitutional: Negative for appetite change and fatigue.  HENT: Negative for congestion.   Respiratory: Negative for cough, chest tightness and shortness of breath.     Cardiovascular: Negative for chest pain, palpitations and leg swelling.  Gastrointestinal: Negative for nausea, abdominal pain, diarrhea and constipation.  Musculoskeletal: Negative for myalgias and arthralgias.  Skin: Negative for pallor.has sores  Neurological: Negative for dizziness.  Psychiatric/Behavioral: The patient is not nervous/anxious.     Physical Exam Constitutional: He is oriented to person, place, and time. No distress. is obese  Eyes: Conjunctivae are normal.  Neck: Neck supple. No JVD present. No thyromegaly present.  Cardiovascular: Normal rate, regular rhythm and intact distal pulses.   Respiratory: Effort normal and breath sounds normal. No respiratory distress. He has no wheezes.  Has  trach   GI: Soft. Bowel sounds are normal. He exhibits no distension. There is no tenderness.    Musculoskeletal: no lower extremity edema   Able to move all extremities  Bilateral lower extremities discolored from impaired circulation Lymphadenopathy:    He has no cervical adenopathy.  Neurological: He is alert and oriented to person, place, and time.  Skin: Skin is warm and dry. He is not diaphoretic.   Right lower leg venous ulceration: 0.5 x 0.5 cm xeroform   Psychiatric: He has a normal mood and affect.      ASSESSMENT/ PLAN:  1. Right lower extremity venous ulceration: will continue [place] treatment and will continue to monitor his status. 4  MD is aware of resident's narcotic use and is in agreement with [facility] plan of care. We will attempt to wean resident as appropriate.     Ok Edwards NP [facility]  Contact [phone] Monday through Friday 8am- 5pm  After hours call [phone]

## 2016-07-24 ENCOUNTER — Ambulatory Visit (HOSPITAL_COMMUNITY)
Admission: RE | Admit: 2016-07-24 | Discharge: 2016-07-24 | Disposition: A | Discharge status: Home / Self Care | Payer: BLUE CROSS/BLUE SHIELD | Source: Ambulatory Visit | Attending: Acute Care | Admitting: Acute Care

## 2016-07-24 DIAGNOSIS — G4733 Obstructive sleep apnea (adult) (pediatric): Secondary | ICD-10-CM | POA: Insufficient documentation

## 2016-07-24 DIAGNOSIS — I5032 Chronic diastolic (congestive) heart failure: Secondary | ICD-10-CM | POA: Insufficient documentation

## 2016-07-24 DIAGNOSIS — G4731 Primary central sleep apnea: Secondary | ICD-10-CM | POA: Diagnosis not present

## 2016-07-24 DIAGNOSIS — L929 Granulomatous disorder of the skin and subcutaneous tissue, unspecified: Secondary | ICD-10-CM | POA: Insufficient documentation

## 2016-07-24 DIAGNOSIS — Z430 Encounter for attention to tracheostomy: Secondary | ICD-10-CM | POA: Diagnosis not present

## 2016-07-24 DIAGNOSIS — J398 Other specified diseases of upper respiratory tract: Secondary | ICD-10-CM | POA: Insufficient documentation

## 2016-07-24 DIAGNOSIS — J9503 Malfunction of tracheostomy stoma: Secondary | ICD-10-CM | POA: Insufficient documentation

## 2016-07-24 NOTE — Progress Notes (Signed)
Subjective:  Scheduled ROV   Patient ID: [name], male    DOB: [birth year], 65 y.o.   MRN: [id number]  HPI  Mr Machi returns today for Emergent trach dislodgement. I had changed his trach just tow days ago. I got a call from the nursing home stating the trach was "riding high" which [name] has alerted the nursing staff to. On arrival the trach was clearly malpositioned. I was able to place the obturator alone thru the trach stoma and had excellent air movement. This allowed me to dilate the stoma enough to replace a # 4 cuffless trach. At that point I asked for Dr Tyson AliasFeinstein to [facility]. I was concerned about possible airway narrowing and/or granuloma perhaps causing this frequent malpositioning. At bedside we did indeed demonstrate narrowing of the airway at the tip of the trach, this appeared like tissue granulation. Also noted significant tracheal malacia. We then replaced the #4 trach w/out difficult.    Review of Systems  Constitutional: Negative.  Negative for activity change.  HENT: Positive for congestion and postnasal drip. Negative for dental problem.   Eyes: Negative.   Respiratory: Negative for apnea, cough, chest tightness and shortness of breath.        Trach riding high  Cardiovascular: Negative.   Gastrointestinal: Negative.   Endocrine: Negative.   Genitourinary: Negative.   Musculoskeletal: Negative.   Neurological: Negative.   Psychiatric/Behavioral: Negative.    HR 878 rr 20 bp 139/85 sats 94%  Objective:   Physical Exam  Constitutional: He is oriented to person, place, and time. He appears well-nourished.  HENT:  Head: Normocephalic.  Mouth/Throat: No oropharyngeal exudate.  Eyes: EOM are normal. Pupils are equal, round, and reactive to light.  Neck: Normal range of motion.    Cardiovascular: Normal rate, regular rhythm, S1 normal, S2 normal and normal pulses.  Exam reveals no distant heart sounds and no friction rub.     Pulmonary/Chest: Effort normal and breath sounds normal. No accessory muscle usage. No respiratory distress.  Abdominal: Soft. Normal appearance and bowel sounds are normal. There is no tenderness.  Musculoskeletal: Normal range of motion.       Right shoulder: He exhibits no tenderness, no bony tenderness, no swelling and no deformity.  Neurological: He is oriented to person, place, and time. He has normal strength. GCS eye subscore is 4. GCS verbal subscore is 5. GCS motor subscore is 6.  Psychiatric: He has a normal mood and affect. His speech is normal and behavior is normal. Thought content normal.   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  Plan Cont routine trach care Will order # 5 distal XLT. Plan to change in next 3d or so.  I have discussed w/ [name] about this plan of care.  We will send extra trach back w/ him   Simonne MartinetPeter E Babcock ACNP-BC [facility] Pager # [phone] OR # [phone] if no answer  STAFF NOTE: I, Rory Percyaniel Rashaud Ybarbo, MD FACP have personally reviewed patient's available data, including medical history, events of note, physical examination and test results as  part of my evaluation. I have discussed with resident/NP and other care providers such as pharmacist, RN and RRT. In addition, I personally evaluated patient and elicited key findings of: awake no distress, some ronchi mild, concern is frequent trach lost out of neck, today trach replaced, recommend bronch to assess granulation tissue and seating of trach, may need extra long, I updated pt in  full  Nicholas Rossettianiel J. Tyson AliasFeinstein, MD, FACP Pgr: [phone] [name] Pulmonary & Critical Care 07/24/2016 12:17 PM

## 2016-07-24 NOTE — Procedures (Signed)
Bronchoscopy Procedure Note Nicholas LiesJames [name] [id number] March 15, 1951  Procedure: Bronchoscopy Indications: Diagnostic evaluation of the airways  Procedure Details Consent: Risks of procedure as well as the alternatives and risks of each were explained to the (patient/caregiver).  Consent for procedure obtained. Time Out: Verified patient identification, verified procedure, site/side was marked, verified correct patient position, special equipment/implants available, medications/allergies/relevent history reviewed, required imaging and test results available.  Performed  In preparation for procedure, patient was given 100% FiO2 and bronchoscope lubricated. Sedation: none  Airway entered and the following bronchi were examined: Bronchi.   Procedures performed: Brushings performed - no Bronchoscope removed.    Evaluation Hemodynamic Status: BP stable throughout; O2 sats: stable throughout Patient's [place] Condition: stable Specimens:  None Complications: No apparent complications Patient did tolerate procedure well.   [name],Nicholas J. 07/24/2016   1. [name] makes it into airway but seems short 2. Tracheal stenosis mild, with prominent granulation forming left lateral wall 3. Long airway with plenty of room for extra long  Plan extra long about 8095 cm trach   Nicholas Rossettianiel J. Tyson AliasFeinstein, [name], [name] Pgr: [phone] [name] 07/24/2016 12:20 PM

## 2016-07-28 ENCOUNTER — Ambulatory Visit (HOSPITAL_COMMUNITY)
Admission: RE | Admit: 2016-07-28 | Discharge: 2016-07-28 | Disposition: A | Discharge status: Home / Self Care | Payer: BLUE CROSS/BLUE SHIELD | Source: Ambulatory Visit | Attending: Acute Care | Admitting: Acute Care

## 2016-07-28 DIAGNOSIS — Z430 Encounter for attention to tracheostomy: Secondary | ICD-10-CM | POA: Insufficient documentation

## 2016-07-28 NOTE — Progress Notes (Signed)
Tracheostomy Procedure Note  Fayrene FearingJames Graef [id number] May 12, 1951  Pre Procedure Tracheostomy Information  Trach Brand: Shiley Size: 4.0 uncuffed Style: Uncuffed Secured by: Velcro   Procedure: routine trach change, BP 138/80, RR 14, Pulse 81, Sat RA 95 %    Post Procedure Tracheostomy Information  Trach Brand: Shiley Size: 5.0 XLT Distal Style: Uncuffed Secured by: Velcro   Post Procedure Evaluation:   ETCO2 positive color change from yellow to purple : Yes.   Vital signs:blood pressure132/89pulse 99, respirations 14and pulse oximetry 94 % . Patients [place] condition: stable Complications: No apparent complications Trach site exam: clean, dry Wound care done: dry and 4 x 4 gauze Patient did tolerate procedure well.   Education: none  Prescription needsnone    Additional need send back to faciloity, 5XLT distal trach, disp inner cannulas 1 bx

## 2016-08-10 ENCOUNTER — Non-Acute Institutional Stay: Payer: BLUE CROSS/BLUE SHIELD | Admitting: Adult Health

## 2016-08-10 ENCOUNTER — Encounter: Payer: Self-pay | Admitting: Adult Health

## 2016-08-10 DIAGNOSIS — E1122 Type 2 diabetes mellitus with diabetic chronic kidney disease: Secondary | ICD-10-CM | POA: Diagnosis not present

## 2016-08-10 DIAGNOSIS — E876 Hypokalemia: Secondary | ICD-10-CM | POA: Diagnosis not present

## 2016-08-10 DIAGNOSIS — J9611 Chronic respiratory failure with hypoxia: Secondary | ICD-10-CM

## 2016-08-10 DIAGNOSIS — N182 Chronic kidney disease, stage 2 (mild): Secondary | ICD-10-CM | POA: Diagnosis not present

## 2016-08-10 DIAGNOSIS — I5032 Chronic diastolic (congestive) heart failure: Secondary | ICD-10-CM

## 2016-08-10 DIAGNOSIS — I82501 Chronic embolism and thrombosis of unspecified deep veins of right lower extremity: Secondary | ICD-10-CM | POA: Diagnosis not present

## 2016-08-10 DIAGNOSIS — E785 Hyperlipidemia, unspecified: Secondary | ICD-10-CM | POA: Diagnosis not present

## 2016-08-10 DIAGNOSIS — L97919 Non-pressure chronic ulcer of unspecified part of right lower leg with unspecified severity: Principal | ICD-10-CM

## 2016-08-10 DIAGNOSIS — I110 Hypertensive heart disease with heart failure: Secondary | ICD-10-CM

## 2016-08-10 DIAGNOSIS — I83019 Varicose veins of right lower extremity with ulcer of unspecified site: Secondary | ICD-10-CM | POA: Insufficient documentation

## 2016-08-10 DIAGNOSIS — E1169 Type 2 diabetes mellitus with other specified complication: Secondary | ICD-10-CM

## 2016-08-10 NOTE — Progress Notes (Signed)
Patient ID: [name], male   DOB: Feb 08, 1951, 65 y.o.   MRN: [id number]   Location:    [place] Room Number: 141-A Place of Service:  SNF (31)   CODE STATUS: Full Code  No Known Allergies  Chief Complaint  Patient presents with  . Medical Management of Chronic Issues    Follow up    HPI:  He is a long term resident of this facility being seen for the management of his chronic illnesses. Overall his status is stable. He had to have a bronchoscopy on 07-24-16 due to trach dislodgement. He is not voicing any complaints. There are no nursing concerns at this time.    Past Medical History:  Diagnosis Date  . Arthritis   . CHF (congestive heart failure) ([place])   . COPD (chronic obstructive pulmonary disease) ([place])   . Hyperlipidemia   . Hypertension   . Morbid obesity ([place])   . Multiple abrasions    rt arm  . RCT (rotator cuff tear)     Past Surgical History:  Procedure Laterality Date  . 25819    . NO PAST SURGERIES    . SHOULDER ARTHROSCOPY WITH SUBACROMIAL DECOMPRESSION, ROTATOR CUFF REPAIR AND BICEP TENDON REPAIR Right 12/28/2014   Procedure: RIGHT SHOULDER ARTHROSCOPY WITH SUBACROMIAL DECOMPRESSION, DISTAL CLAVICLE RESECTION, ROTATOR CUFF REPAIR ;  Surgeon: Sydnee Cabal, MD;  Location: [facility];  Service: Orthopedics;  Laterality: Right;  . TRACHEOSTOMY REVISION N/A 01/18/2015   Procedure: TRACHEOSTOMY REVISION;  Surgeon: Ruby Cola, MD;  Location: [facility] OR;  Service: ENT;  Laterality: N/A;  . TRACHEOSTOMY TUBE PLACEMENT N/A 01/16/2015   Procedure: TRACHEOSTOMY;  Surgeon: Jodi Marble, MD;  Location: [place];  Service: ENT;  Laterality: N/A;  . TRACHEOSTOMY TUBE PLACEMENT N/A 01/22/2015   Procedure: TRACHEOSTOMY;  Surgeon: Jodi Marble, MD;  Location: [place];  Service: ENT;  Laterality: N/A;    Social History   Social History  . Marital status: Single    Spouse name: N/A  . Number of children: N/A  . Years of education: N/A   Occupational History    . Not on file.   Social History Main Topics  . Smoking status: Former Smoker    Packs/day: 0.20    Types: Cigarettes  . Smokeless tobacco: Former Systems developer  . Alcohol use No  . Drug use: No  . Sexual activity: Not on file   Other Topics Concern  . Not on file   Social History Narrative  . No narrative on file   Family History  Problem Relation Age of Onset  . Heart disease Mother   . Heart attack Mother   . Heart disease Brother       VITAL SIGNS BP 118/74   Pulse 76   Temp 97.9 F (36.6 C) (Oral)   Resp 18   Ht 6' (1.829 m)   Wt 233 lb (105.7 kg)   SpO2 97%   BMI 31.60 kg/m   Patient's Medications  New Prescriptions   No medications on file  Previous Medications   ATORVASTATIN (LIPITOR) 20 MG TABLET    Take 20 mg by mouth daily.   CLONAZEPAM (KLONOPIN) 0.5 MG TABLET    Take 0.25 mg by mouth at bedtime.    DIPHENHYDRAMINE (BENADRYL) 25 MG TABLET    Take 25 mg by mouth every 6 (six) hours as needed for itching.   DOCUSATE SODIUM (COLACE) 100 MG CAPSULE    Take 1 capsule (100 mg total) by mouth  2 (two) times daily.   DULOXETINE (CYMBALTA) 60 MG CAPSULE    Take 60 mg by mouth daily.   FAMOTIDINE (PEPCID) 20 MG TABLET    Take 20 mg by mouth daily.   FOLIC ACID (FOLVITE) 1 MG TABLET    Take 1 mg by mouth daily.   HYDRALAZINE (APRESOLINE) 25 MG TABLET    Give 1.5 mg by mouth two times daily   IPRATROPIUM-ALBUTEROL (DUONEB) 0.5-2.5 (3) MG/3ML SOLN    Take 3 mLs by nebulization every 6 (six) hours as needed. For shortness of breath   ISOSORBIDE MONONITRATE (ISMO,MONOKET) 20 MG TABLET    Take 20 mg by mouth 2 (two) times daily at 10 AM and 5 PM.   LIDOCAINE (LIDODERM) 5 %    Place 1 patch onto the skin daily. Remove & Discard patch within 12 hours or as directed by MD To right shoulder   MOMETASONE (NASONEX) 50 MCG/ACT NASAL SPRAY    2 sprays daily.   OMEGA-3 FATTY ACIDS (FISH OIL) 1000 MG CAPS    Take 1,000 mg by mouth daily.    ONDANSETRON (ZOFRAN) 4 MG TABLET    Take 1  tablet (4 mg total) by mouth every 6 (six) hours as needed for nausea or vomiting.   POLYETHYLENE GLYCOL (MIRALAX / GLYCOLAX) PACKET    Take 17 g by mouth daily.   POTASSIUM (POTASSIMIN PO)    Take 40 mEq by mouth 4 (four) times daily.    RIVAROXABAN (XARELTO) 20 MG TABS TABLET    Take 20 mg by mouth daily with supper.   TORSEMIDE (DEMADEX) 10 MG TABLET    Take 10 mg by mouth daily.   TRIAMTERENE (DYRENIUM) 50 MG CAPSULE    Take 1 capsule (50 mg total) by mouth daily.  Modified Medications   No medications on file  Discontinued Medications   No medications on file     SIGNIFICANT DIAGNOSTIC EXAMS  01-01-15: 2-d echo: Left ventricle: The cavity size was normal. There was mild focal basal and mild concentric hypertrophy of the septum. Systolic function was normal. The estimated ejection fraction was in the range of 60% to 65%. Wall motion was normal; there were no regional wall motion abnormalities. Doppler parameters are consistent with abnormal left ventricular relaxation (grade 1 diastolic dysfunction). There was no evidence of elevate ventricular filling pressure by Doppler parameters. - Aortic valve: There was no regurgitation. - Aortic root: The aortic root was normal in size. - Mitral valve: Structurally normal valve. There was no regurgitation. - Right ventricle: Systolic function was normal. - Right atrium: The atrium was normal in size. - Tricuspid valve: There was trivial regurgitation. - Pulmonary arteries: Systolic pressure was within the normal range.  01-07-15: renal ultrasound: Limited examination demonstrating no acute abnormality. Negative for Hydronephrosis. Possible nonobstructing stone lower pole left kidney.  01-18-15: IVC filter placement: Successful IVC filter placement. This is temporary or can remain in place to become permanent.  01-28-15: ct of head: 1. No acute intracranial findings demonstrated. Mild atrophy and periventricular white matter disease. 2. Bilateral  mastoid effusions with opacification of the left middle ear.  06-17-15: chest x-ray; Cardiomegaly with mild pulmonary vascular congestion  06-19-15: ct guided prostate abscess drain: Fluid collection along the right side of the prostate compatible with an abscess. 30 mL of purulent fluid was removed.  06-24-15: ct of abdomen and pelvis: Fatty infiltration of the liver. Bilateral nonobstructive nephrolithiasis. Atherosclerosis of abdominal aorta without aneurysm formation. Sigmoid diverticulosis is noted without  inflammation. Interval placement of transgluteal drainage catheter into right side of the pelvis. Fluid collection noted adjacent to prostate gland on prior exam has been completely decompressed.  06-25-15: kub: Moderate ileus pattern.    LABS REVIEWED:   07-29-15; INR 2.68  11-11-15: inr 2.26: coumadin 6 mg  12-02-15: INR 2.56 11-17-15: liver normal albumin 3.6; chol 154; ldl 92; trig 101; hdl 42  02-10-16: wbc 9.5; hgb 13.8; hct 40.4; mcv 82.3 ;plt 322; glucose 98; bun 35; creat 1.46; k+ 2.6; na++ 132; alk phos 174; albumin 3.9; chol 155; ldl 83; trig 115; hdl 49  02-13-16: glucose 112; bun 26; creat 1.25; k+ 2.7; na++ 132  02-14-16: wbc 9.4; hgb 14.2; hct 42.1; mcv 81.6; plt 326; glucose 99; bun 27 creat 1.42; k+ 3.0; na++ 133; alk phos 17 ast 33; alt 27; albumin 3.7  02-17-16: glucose 83; bun 31; creat 1.53; k+ 3.2; na++ 136  02-19-16: urine osmolality: 351;  02-20-16: glucose 93; bun 26; creat 1.59; k+ 3.8; na++ 133  02-25-16: glucose 88; bun 16; creat 1.24; k+ 5.0; na++ 138 02-27-16; glucose 101; bun 15; creat 1.16; k+ 4.5; na++ 139  03-02-16: INR 2.9: coumadin 6.5 mg daily  03-23-16: INR 1.8 03-30-16: INR 1.4  04-22-16: glucose 82; bun 23; creat 1.16; k+ 4.2; na++ 139; liver normal albumin 3.7; hgb a1c 4.8 04-23-16: urine micro-albumin 1.3 05-01-16: glucose 79; bun 22; creat 1.20; k+ 4.0; na++ 140;    Review of Systems Constitutional: Negative for appetite change and fatigue.  HENT:  Negative for congestion.   Respiratory: Negative for cough, chest tightness and shortness of breath.   Cardiovascular: Negative for chest pain, palpitations and leg swelling.  Gastrointestinal: Negative for nausea, abdominal pain, diarrhea and constipation.  Musculoskeletal: Negative for myalgias and arthralgias.  Skin: Negative for pallor.  Neurological: Negative for dizziness.  Psychiatric/Behavioral: The patient is not nervous/anxious.     Physical Exam Constitutional: He is oriented to person, place, and time. No distress. is obese  Eyes: Conjunctivae are normal.  Neck: Neck supple. No JVD present. No thyromegaly present.  Cardiovascular: Normal rate, regular rhythm and intact distal pulses.   Respiratory: Effort normal and breath sounds normal. No respiratory distress. He has no wheezes.  Has  trach   GI: Soft. Bowel sounds are normal. He exhibits no distension. There is no tenderness.    Musculoskeletal: no lower extremity edema   Able to move all extremities  Bilateral lower extremities discolored from impaired circulation Lymphadenopathy:    He has no cervical adenopathy.  Neurological: He is alert and oriented to person, place, and time.  Skin: Skin is warm and dry. He is not diaphoretic.   Psychiatric: He has a normal mood and affect.      ASSESSMENT/ PLAN:  1. Chronic pain: his pain is presently being managed with lidoderm patch to right shoulder; will not make changes will monitor his status   2.  chronic respiratory failure; obesity hypoventilation syndrome: has duoneb every 6 hours as needed;   is presenlty stable is trach for structure support.  Will continue nasonex daily  Will monitor    3. Diastolic heart failure: will continue demadex 10  mg daily; dyrenium 50 mg daily  His renal function could not support zaroxolyn thera[y   4. Hypokalemia: will continue  k+  40 meq 4 times daily    5. Hypertension: will continue hydralazine 37.5 mg twice daily  isordil 20  mg twice daily   6. Gerd: will continue pepcid  20 mg daily   7. Constipation: will continue miralax daily colace twice daily   8. Depression with anxiety: will continue cymbalta 60 mg  daily; take klonopin 0.25 mg nightly  for anxiety  9.  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   10. Dyslipidemia: will continue lipitor 20 mg daily and fish oil 1 gm daily ldl 83; trig 115  11. Diabetes: his hgb a1c is 4.8. Is presently not on medications; will not make changes and will monitor his status.      MD is aware of resident's narcotic use and is in agreement with [facility] plan of care. We will attempt to wean resident as appropriate.     Ok Edwards NP [facility]  Contact [phone] Monday through Friday 8am- 5pm  After hours call [phone]

## 2016-08-17 ENCOUNTER — Non-Acute Institutional Stay: Payer: BLUE CROSS/BLUE SHIELD | Admitting: Adult Health

## 2016-08-17 ENCOUNTER — Encounter: Payer: Self-pay | Admitting: Adult Health

## 2016-08-17 DIAGNOSIS — I5032 Chronic diastolic (congestive) heart failure: Secondary | ICD-10-CM

## 2016-08-17 NOTE — Progress Notes (Signed)
Patient ID: [name], male   DOB: 03/02/1951, 65 y.o.   MRN: [id number]   Location:   [place] Room Number: 141-A Place of Service:  SNF (31)   CODE STATUS: Full Code  No Known Allergies  Chief Complaint  Patient presents with  . Acute Visit    Weight Gain    HPI:  He has had a weight gain from 238 pounds on 08-11-16 to his [place] weight of 244 pounds. He is not complaining of any increased shortness or chest pain. He tells me that he is feeing fine.    Past Medical History:  Diagnosis Date  . Arthritis   . CHF (congestive heart failure) ([place])   . COPD (chronic obstructive pulmonary disease) ([place])   . Hyperlipidemia   . Hypertension   . Morbid obesity ([place])   . Multiple abrasions    rt arm  . RCT (rotator cuff tear)     Past Surgical History:  Procedure Laterality Date  . 25819    . NO PAST SURGERIES    . SHOULDER ARTHROSCOPY WITH SUBACROMIAL DECOMPRESSION, ROTATOR CUFF REPAIR AND BICEP TENDON REPAIR Right 12/28/2014   Procedure: RIGHT SHOULDER ARTHROSCOPY WITH SUBACROMIAL DECOMPRESSION, DISTAL CLAVICLE RESECTION, ROTATOR CUFF REPAIR ;  Surgeon: Sydnee Cabal, MD;  Location: [facility];  Service: Orthopedics;  Laterality: Right;  . TRACHEOSTOMY REVISION N/A 01/18/2015   Procedure: TRACHEOSTOMY REVISION;  Surgeon: Ruby Cola, MD;  Location: [facility] OR;  Service: ENT;  Laterality: N/A;  . TRACHEOSTOMY TUBE PLACEMENT N/A 01/16/2015   Procedure: TRACHEOSTOMY;  Surgeon: Jodi Marble, MD;  Location: [place];  Service: ENT;  Laterality: N/A;  . TRACHEOSTOMY TUBE PLACEMENT N/A 01/22/2015   Procedure: TRACHEOSTOMY;  Surgeon: Jodi Marble, MD;  Location: [place];  Service: ENT;  Laterality: N/A;    Social History   Social History  . Marital status: Single    Spouse name: N/A  . Number of children: N/A  . Years of education: N/A   Occupational History  . Not on file.   Social History Main Topics  . Smoking status: Former Smoker    Packs/day: 0.20    Types:  Cigarettes  . Smokeless tobacco: Former Systems developer  . Alcohol use No  . Drug use: No  . Sexual activity: Not on file   Other Topics Concern  . Not on file   Social History Narrative  . No narrative on file   Family History  Problem Relation Age of Onset  . Heart disease Mother   . Heart attack Mother   . Heart disease Brother       VITAL SIGNS BP (!) 148/84   Pulse 74   Temp 98 F (36.7 C) (Oral)   Resp 18   Ht 6' (1.829 m)   Wt 244 lb (110.7 kg)   SpO2 96%   BMI 33.09 kg/m   Patient's Medications  New Prescriptions   No medications on file  Previous Medications   ATORVASTATIN (LIPITOR) 20 MG TABLET    Take 20 mg by mouth daily.   CLONAZEPAM (KLONOPIN) 0.5 MG TABLET    Take 0.25 mg by mouth at bedtime.    DIPHENHYDRAMINE (BENADRYL) 25 MG TABLET    Take 25 mg by mouth every 6 (six) hours as needed for itching.   DOCUSATE SODIUM (COLACE) 100 MG CAPSULE    Take 1 capsule (100 mg total) by mouth 2 (two) times daily.   DULOXETINE (CYMBALTA) 60 MG CAPSULE    Take 60  mg by mouth daily.   FAMOTIDINE (PEPCID) 20 MG TABLET    Take 20 mg by mouth daily.   FOLIC ACID (FOLVITE) 1 MG TABLET    Take 1 mg by mouth daily.   HYDRALAZINE (APRESOLINE) 25 MG TABLET    Give 1.5 mg by mouth two times daily   IPRATROPIUM-ALBUTEROL (DUONEB) 0.5-2.5 (3) MG/3ML SOLN    Take 3 mLs by nebulization every 6 (six) hours as needed. For shortness of breath   ISOSORBIDE MONONITRATE (ISMO,MONOKET) 20 MG TABLET    Take 20 mg by mouth 2 (two) times daily at 10 AM and 5 PM.   LIDOCAINE (LIDODERM) 5 %    Place 1 patch onto the skin daily. Remove & Discard patch within 12 hours or as directed by MD To right shoulder   MOMETASONE (NASONEX) 50 MCG/ACT NASAL SPRAY    2 sprays daily.   OMEGA-3 FATTY ACIDS (FISH OIL) 1000 MG CAPS    Take 1,000 mg by mouth daily.    ONDANSETRON (ZOFRAN) 4 MG TABLET    Take 1 tablet (4 mg total) by mouth every 6 (six) hours as needed for nausea or vomiting.   POTASSIUM (POTASSIMIN PO)     Take 40 mEq by mouth 4 (four) times daily.    RIVAROXABAN (XARELTO) 20 MG TABS TABLET    Take 20 mg by mouth daily with supper.   TORSEMIDE (DEMADEX) 10 MG TABLET    Take 10 mg by mouth daily.   TRIAMTERENE (DYRENIUM) 50 MG CAPSULE    Take 1 capsule (50 mg total) by mouth daily.  Modified Medications   No medications on file  Discontinued Medications   POLYETHYLENE GLYCOL (MIRALAX / GLYCOLAX) PACKET    Take 17 g by mouth daily.     SIGNIFICANT DIAGNOSTIC EXAMS  01-01-15: 2-d echo: Left ventricle: The cavity size was normal. There was mild focal basal and mild concentric hypertrophy of the septum. Systolic function was normal. The estimated ejection fraction was in the range of 60% to 65%. Wall motion was normal; there were no regional wall motion abnormalities. Doppler parameters are consistent with abnormal left ventricular relaxation (grade 1 diastolic dysfunction). There was no evidence of elevate ventricular filling pressure by Doppler parameters. - Aortic valve: There was no regurgitation. - Aortic root: The aortic root was normal in size. - Mitral valve: Structurally normal valve. There was no regurgitation. - Right ventricle: Systolic function was normal. - Right atrium: The atrium was normal in size. - Tricuspid valve: There was trivial regurgitation. - Pulmonary arteries: Systolic pressure was within the normal range.  01-07-15: renal ultrasound: Limited examination demonstrating no acute abnormality. Negative for Hydronephrosis. Possible nonobstructing stone lower pole left kidney.  01-18-15: IVC filter placement: Successful IVC filter placement. This is temporary or can remain in place to become permanent.  01-28-15: ct of head: 1. No acute intracranial findings demonstrated. Mild atrophy and periventricular white matter disease. 2. Bilateral mastoid effusions with opacification of the left middle ear.  06-24-15: ct of abdomen and pelvis: Fatty infiltration of the  liver. Bilateral nonobstructive nephrolithiasis. Atherosclerosis of abdominal aorta without aneurysm formation. Sigmoid diverticulosis is noted without inflammation. Interval placement of transgluteal drainage catheter into right side of the pelvis. Fluid collection noted adjacent to prostate gland on prior exam has been completely decompressed.   LABS REVIEWED:   11-11-15: inr 2.26: coumadin 6 mg  12-02-15: INR 2.56 11-17-15: liver normal albumin 3.6; chol 154; ldl 92; trig 101; hdl 42  0-5-86: wbc  9.5; hgb 13.8; hct 40.4; mcv 82.3 ;plt 322; glucose 98; bun 35; creat 1.46; k+ 2.6; na++ 132; alk phos 174; albumin 3.9; chol 155; ldl 83; trig 115; hdl 49  02-13-16: glucose 112; bun 26; creat 1.25; k+ 2.7; na++ 132  02-14-16: wbc 9.4; hgb 14.2; hct 42.1; mcv 81.6; plt 326; glucose 99; bun 27 creat 1.42; k+ 3.0; na++ 133; alk phos 17 ast 33; alt 27; albumin 3.7  02-17-16: glucose 83; bun 31; creat 1.53; k+ 3.2; na++ 136  02-19-16: urine osmolality: 351;  02-20-16: glucose 93; bun 26; creat 1.59; k+ 3.8; na++ 133  02-25-16: glucose 88; bun 16; creat 1.24; k+ 5.0; na++ 138 02-27-16; glucose 101; bun 15; creat 1.16; k+ 4.5; na++ 139  03-02-16: INR 2.9: coumadin 6.5 mg daily  03-23-16: INR 1.8 03-30-16: INR 1.4  04-22-16: glucose 82; bun 23; creat 1.16; k+ 4.2; na++ 139; liver normal albumin 3.7; hgb a1c 4.8 04-23-16: urine micro-albumin 1.3 05-01-16: glucose 79; bun 22; creat 1.20; k+ 4.0; na++ 140;    Review of Systems Constitutional: Negative for appetite change and fatigue.  HENT: Negative for congestion.   Respiratory: Negative for cough, chest tightness and shortness of breath.   Cardiovascular: Negative for chest pain, palpitations and leg swelling.  Gastrointestinal: Negative for nausea, abdominal pain, diarrhea and constipation.  Musculoskeletal: Negative for myalgias and arthralgias.  Skin: Negative for pallor.  Neurological: Negative for dizziness.  Psychiatric/Behavioral: The patient is not  nervous/anxious.     Physical Exam Constitutional: He is oriented to person, place, and time. No distress. is obese  Eyes: Conjunctivae are normal.  Neck: Neck supple. No JVD present. No thyromegaly present.  Cardiovascular: Normal rate, regular rhythm and intact distal pulses.   Respiratory: Effort normal and breath sounds normal. No respiratory distress. He has no wheezes.  Has  trach   GI: Soft. Bowel sounds are normal. He exhibits no distension. There is no tenderness.    Musculoskeletal: trace extremity edema   Able to move all extremities  Bilateral lower extremities discolored from impaired circulation Lymphadenopathy:    He has no cervical adenopathy.  Neurological: He is alert and oriented to person, place, and time.  Skin: Skin is warm and dry. He is not diaphoretic.   Psychiatric: He has a normal mood and affect.      ASSESSMENT/ PLAN:  1. Diastolic heart failure: will continue  dyrenium 50 mg daily  Will increase demadex to 20 mg int he AM and 10 mg in the PM.  His renal function could not support zaroxolyn thera[y   Will check bmp in one week.   MD is aware of resident's narcotic use and is in agreement with [place] plan of care. We will attempt to wean resident as appropriate.     Ok Edwards NP [facility]  Contact [phone] Monday through Friday 8am- 5pm  After hours call [phone]

## 2016-09-14 ENCOUNTER — Encounter: Payer: Self-pay | Admitting: Adult Health

## 2016-09-14 ENCOUNTER — Non-Acute Institutional Stay (SKILLED_NURSING_FACILITY): Payer: BLUE CROSS/BLUE SHIELD | Admitting: Adult Health

## 2016-09-14 DIAGNOSIS — E1122 Type 2 diabetes mellitus with diabetic chronic kidney disease: Secondary | ICD-10-CM | POA: Diagnosis not present

## 2016-09-14 DIAGNOSIS — E1169 Type 2 diabetes mellitus with other specified complication: Secondary | ICD-10-CM

## 2016-09-14 DIAGNOSIS — Z95828 Presence of other vascular implants and grafts: Secondary | ICD-10-CM

## 2016-09-14 DIAGNOSIS — Z930 Tracheostomy status: Secondary | ICD-10-CM

## 2016-09-14 DIAGNOSIS — N182 Chronic kidney disease, stage 2 (mild): Secondary | ICD-10-CM

## 2016-09-14 DIAGNOSIS — E785 Hyperlipidemia, unspecified: Secondary | ICD-10-CM | POA: Diagnosis not present

## 2016-09-14 DIAGNOSIS — J9611 Chronic respiratory failure with hypoxia: Secondary | ICD-10-CM | POA: Diagnosis not present

## 2016-09-14 DIAGNOSIS — I82501 Chronic embolism and thrombosis of unspecified deep veins of right lower extremity: Secondary | ICD-10-CM

## 2016-09-14 DIAGNOSIS — I5032 Chronic diastolic (congestive) heart failure: Secondary | ICD-10-CM | POA: Diagnosis not present

## 2016-09-14 DIAGNOSIS — I110 Hypertensive heart disease with heart failure: Secondary | ICD-10-CM | POA: Diagnosis not present

## 2016-09-14 NOTE — Progress Notes (Signed)
Location:   Psychiatrist of Service:  SNF (31)   CODE STATUS: full code  No Known Allergies  Chief Complaint  Patient presents with  . Medical Management of Chronic Issues    HPI:  He is a long term resident of this facitly being seen for the management of his chronic illnesses. Overall his status is stable. He tells me that he is feeling good and has no complaints. There are no nursing concerns at this time.    Past Medical History:  Diagnosis Date  . Arthritis   . CHF (congestive heart failure) ([place])   . COPD (chronic obstructive pulmonary disease) ([place])   . Hyperlipidemia   . Hypertension   . Morbid obesity ([place])   . Multiple abrasions    rt arm  . RCT (rotator cuff tear)     Past Surgical History:  Procedure Laterality Date  . 25819    . NO PAST SURGERIES    . SHOULDER ARTHROSCOPY WITH SUBACROMIAL DECOMPRESSION, ROTATOR CUFF REPAIR AND BICEP TENDON REPAIR Right 12/28/2014   Procedure: RIGHT SHOULDER ARTHROSCOPY WITH SUBACROMIAL DECOMPRESSION, DISTAL CLAVICLE RESECTION, ROTATOR CUFF REPAIR ;  Surgeon: Sydnee Cabal, MD;  Location: [facility];  Service: Orthopedics;  Laterality: Right;  . TRACHEOSTOMY REVISION N/A 01/18/2015   Procedure: TRACHEOSTOMY REVISION;  Surgeon: Ruby Cola, MD;  Location: [facility] OR;  Service: ENT;  Laterality: N/A;  . TRACHEOSTOMY TUBE PLACEMENT N/A 01/16/2015   Procedure: TRACHEOSTOMY;  Surgeon: Jodi Marble, MD;  Location: [place];  Service: ENT;  Laterality: N/A;  . TRACHEOSTOMY TUBE PLACEMENT N/A 01/22/2015   Procedure: TRACHEOSTOMY;  Surgeon: Jodi Marble, MD;  Location: [place];  Service: ENT;  Laterality: N/A;    Social History   Social History  . Marital status: Single    Spouse name: N/A  . Number of children: N/A  . Years of education: N/A   Occupational History  . Not on file.   Social History Main Topics  . Smoking status: Former Smoker    Packs/day: 0.20    Types: Cigarettes  . Smokeless tobacco: Former Systems developer  .  Alcohol use No  . Drug use: No  . Sexual activity: Not on file   Other Topics Concern  . Not on file   Social History Narrative  . No narrative on file   Family History  Problem Relation Age of Onset  . Heart disease Mother   . Heart attack Mother   . Heart disease Brother       VITAL SIGNS BP 116/64   Pulse 100   Temp 97.5 F (36.4 C)   Resp 20   Ht 6' (1.829 m)   Wt 250 lb (113.4 kg)   SpO2 97%   BMI 33.91 kg/m   Patient's Medications  New Prescriptions   No medications on file  Previous Medications   ATORVASTATIN (LIPITOR) 20 MG TABLET    Take 20 mg by mouth daily.   CLONAZEPAM (KLONOPIN) 0.5 MG TABLET    Take 0.25 mg by mouth at bedtime.    DIPHENHYDRAMINE (BENADRYL) 25 MG TABLET    Take 25 mg by mouth every 6 (six) hours as needed for itching.   DOCUSATE SODIUM (COLACE) 100 MG CAPSULE    Take 1 capsule (100 mg total) by mouth 2 (two) times daily.   DULOXETINE (CYMBALTA) 60 MG CAPSULE    Take 60 mg by mouth daily.   FAMOTIDINE (PEPCID) 20 MG TABLET    Take 20  mg by mouth daily.   FOLIC ACID (FOLVITE) 1 MG TABLET    Take 1 mg by mouth daily.   HYDRALAZINE (APRESOLINE) 25 MG TABLET    Give 1.5 mg by mouth two times daily   IPRATROPIUM-ALBUTEROL (DUONEB) 0.5-2.5 (3) MG/3ML SOLN    Take 3 mLs by nebulization every 6 (six) hours as needed. For shortness of breath   ISOSORBIDE MONONITRATE (ISMO,MONOKET) 20 MG TABLET    Take 20 mg by mouth 2 (two) times daily at 10 AM and 5 PM.   LIDOCAINE (LIDODERM) 5 %    Place 1 patch onto the skin daily. Remove & Discard patch within 12 hours or as directed by MD To right shoulder   MOMETASONE (NASONEX) 50 MCG/ACT NASAL SPRAY    2 sprays daily.   OMEGA-3 FATTY ACIDS (FISH OIL) 1000 MG CAPS    Take 1,000 mg by mouth daily.    ONDANSETRON (ZOFRAN) 4 MG TABLET    Take 1 tablet (4 mg total) by mouth every 6 (six) hours as needed for nausea or vomiting.   POTASSIUM (POTASSIMIN PO)    Take 40 mEq by mouth 4 (four) times daily.     RIVAROXABAN (XARELTO) 20 MG TABS TABLET    Take 20 mg by mouth daily with supper.   TORSEMIDE (DEMADEX) 10 MG TABLET    Take 10 mg by mouth daily.   TRIAMTERENE (DYRENIUM) 50 MG CAPSULE    Take 1 capsule (50 mg total) by mouth daily.  Modified Medications   No medications on file  Discontinued Medications   No medications on file     SIGNIFICANT DIAGNOSTIC EXAMS   01-01-15: 2-d echo: Left ventricle: The cavity size was normal. There was mild focal basal and mild concentric hypertrophy of the septum. Systolic function was normal. The estimated ejection fraction was in the range of 60% to 65%. Wall motion was normal; there were no regional wall motion abnormalities. Doppler parameters are consistent with abnormal left ventricular relaxation (grade 1 diastolic dysfunction). There was no evidence of elevate ventricular filling pressure by Doppler parameters. - Aortic valve: There was no regurgitation. - Aortic root: The aortic root was normal in size. - Mitral valve: Structurally normal valve. There was no regurgitation. - Right ventricle: Systolic function was normal. - Right atrium: The atrium was normal in size. - Tricuspid valve: There was trivial regurgitation. - Pulmonary arteries: Systolic pressure was within the normal range.  01-07-15: renal ultrasound: Limited examination demonstrating no acute abnormality. Negative for Hydronephrosis. Possible nonobstructing stone lower pole left kidney.  01-18-15: IVC filter placement: Successful IVC filter placement. This is temporary or can remain in place to become permanent.  01-28-15: ct of head: 1. No acute intracranial findings demonstrated. Mild atrophy and periventricular white matter disease. 2. Bilateral mastoid effusions with opacification of the left middle ear.  06-17-15: chest x-ray; Cardiomegaly with mild pulmonary vascular congestion  06-19-15: ct guided prostate abscess drain: Fluid collection along the right side of the prostate  compatible with an abscess. 30 mL of purulent fluid was removed.  06-24-15: ct of abdomen and pelvis: Fatty infiltration of the liver. Bilateral nonobstructive nephrolithiasis. Atherosclerosis of abdominal aorta without aneurysm formation. Sigmoid diverticulosis is noted without inflammation. Interval placement of transgluteal drainage catheter into right side of the pelvis. Fluid collection noted adjacent to prostate gland on prior exam has been completely decompressed.  06-25-15: kub: Moderate ileus pattern.  07-11-16: neck x-ray: unremarkable soft neck tissue     LABS REVIEWED:  11-11-15: inr 2.26: coumadin 6 mg  12-02-15: INR 2.56 11-17-15: liver normal albumin 3.6; chol 154; ldl 92; trig 101; hdl 42  02-10-16: wbc 9.5; hgb 13.8; hct 40.4; mcv 82.3 ;plt 322; glucose 98; bun 35; creat 1.46; k+ 2.6; na++ 132; alk phos 174; albumin 3.9; chol 155; ldl 83; trig 115; hdl 49  02-13-16: glucose 112; bun 26; creat 1.25; k+ 2.7; na++ 132  02-14-16: wbc 9.4; hgb 14.2; hct 42.1; mcv 81.6; plt 326; glucose 99; bun 27 creat 1.42; k+ 3.0; na++ 133; alk phos 17 ast 33; alt 27; albumin 3.7  02-17-16: glucose 83; bun 31; creat 1.53; k+ 3.2; na++ 136  02-19-16: urine osmolality: 351;  02-20-16: glucose 93; bun 26; creat 1.59; k+ 3.8; na++ 133  02-25-16: glucose 88; bun 16; creat 1.24; k+ 5.0; na++ 138 02-27-16; glucose 101; bun 15; creat 1.16; k+ 4.5; na++ 139  03-02-16: INR 2.9: coumadin 6.5 mg daily  03-23-16: INR 1.8 03-30-16: INR 1.4  04-22-16: glucose 82; bun 23; creat 1.16; k+ 4.2; na++ 139; liver normal albumin 3.7; hgb a1c 4.8 04-23-16: urine micro-albumin 1.3 05-01-16: glucose 79; bun 22; creat 1.20; k+ 4.0; na++ 140;    Review of Systems Constitutional: Negative for appetite change and fatigue.  HENT: Negative for congestion.   Respiratory: Negative for cough, chest tightness and shortness of breath.   Cardiovascular: Negative for chest pain, palpitations and leg swelling.  Gastrointestinal: Negative  for nausea, abdominal pain, diarrhea and constipation.  Musculoskeletal: Negative for myalgias and arthralgias.  Skin: Negative for pallor.  Neurological: Negative for dizziness.  Psychiatric/Behavioral: The patient is not nervous/anxious.     Physical Exam Constitutional: He is oriented to person, place, and time. No distress. is obese  Eyes: Conjunctivae are normal.  Neck: Neck supple. No JVD present. No thyromegaly present.  Cardiovascular: Normal rate, regular rhythm and intact distal pulses.   Respiratory: Effort normal and breath sounds normal. No respiratory distress. He has no wheezes.  Has  trach   GI: Soft. Bowel sounds are normal. He exhibits no distension. There is no tenderness.    Musculoskeletal: no lower extremity edema   Able to move all extremities  Bilateral lower extremities discolored from impaired circulation Lymphadenopathy:    He has no cervical adenopathy.  Neurological: He is alert and oriented to person, place, and time.  Skin: Skin is warm and dry. He is not diaphoretic.   Psychiatric: He has a normal mood and affect.      ASSESSMENT/ PLAN:  1. Chronic pain: his pain is presently being managed with lidoderm patch to right shoulder; will not make changes will monitor his status   2.  chronic respiratory failure; obesity hypoventilation syndrome: has duoneb every 6 hours as needed;   is presenlty stable is trach for structure support.  Will continue nasonex daily  Will monitor    3. Diastolic heart failure: will continue demadex 10  mg daily; dyrenium 50 mg daily  His renal function could not support zaroxolyn thera[y   4. Hypokalemia: will continue  k+  40 meq 4 times daily    5. Hypertension: will continue hydralazine 37.5 mg twice daily  isordil 20 mg twice daily   6. Gerd: will continue pepcid 20 mg daily   7. Constipation: will continue miralax daily colace twice daily   8. Depression with anxiety: will continue cymbalta 60 mg  daily; take  klonopin 0.25 mg nightly  for anxiety  9.  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   10. Dyslipidemia: will continue lipitor 20 mg daily and fish oil 1 gm daily ldl 83; trig 115  11. Diabetes: his hgb a1c is 4.8. Is presently not on medications; will not make changes and will monitor his status.    Will check cbc; cmp hgb a1c lipids    MD is aware of resident's narcotic use and is in agreement with [facility] plan of care. We will attempt to wean resident as apropriate   Ok Edwards NP [facility]  Contact [phone] Monday through Friday 8am- 5pm  After hours call [phone]

## 2016-09-16 IMAGING — DX DG CHEST 1V PORT
1 series · 1 of 1 positions shown · non-contrast
Comparison: None.

CLINICAL DATA: Hypoxia.  Respiratory distress.

EXAM:
PORTABLE CHEST - 1 VIEW

[chest ap]
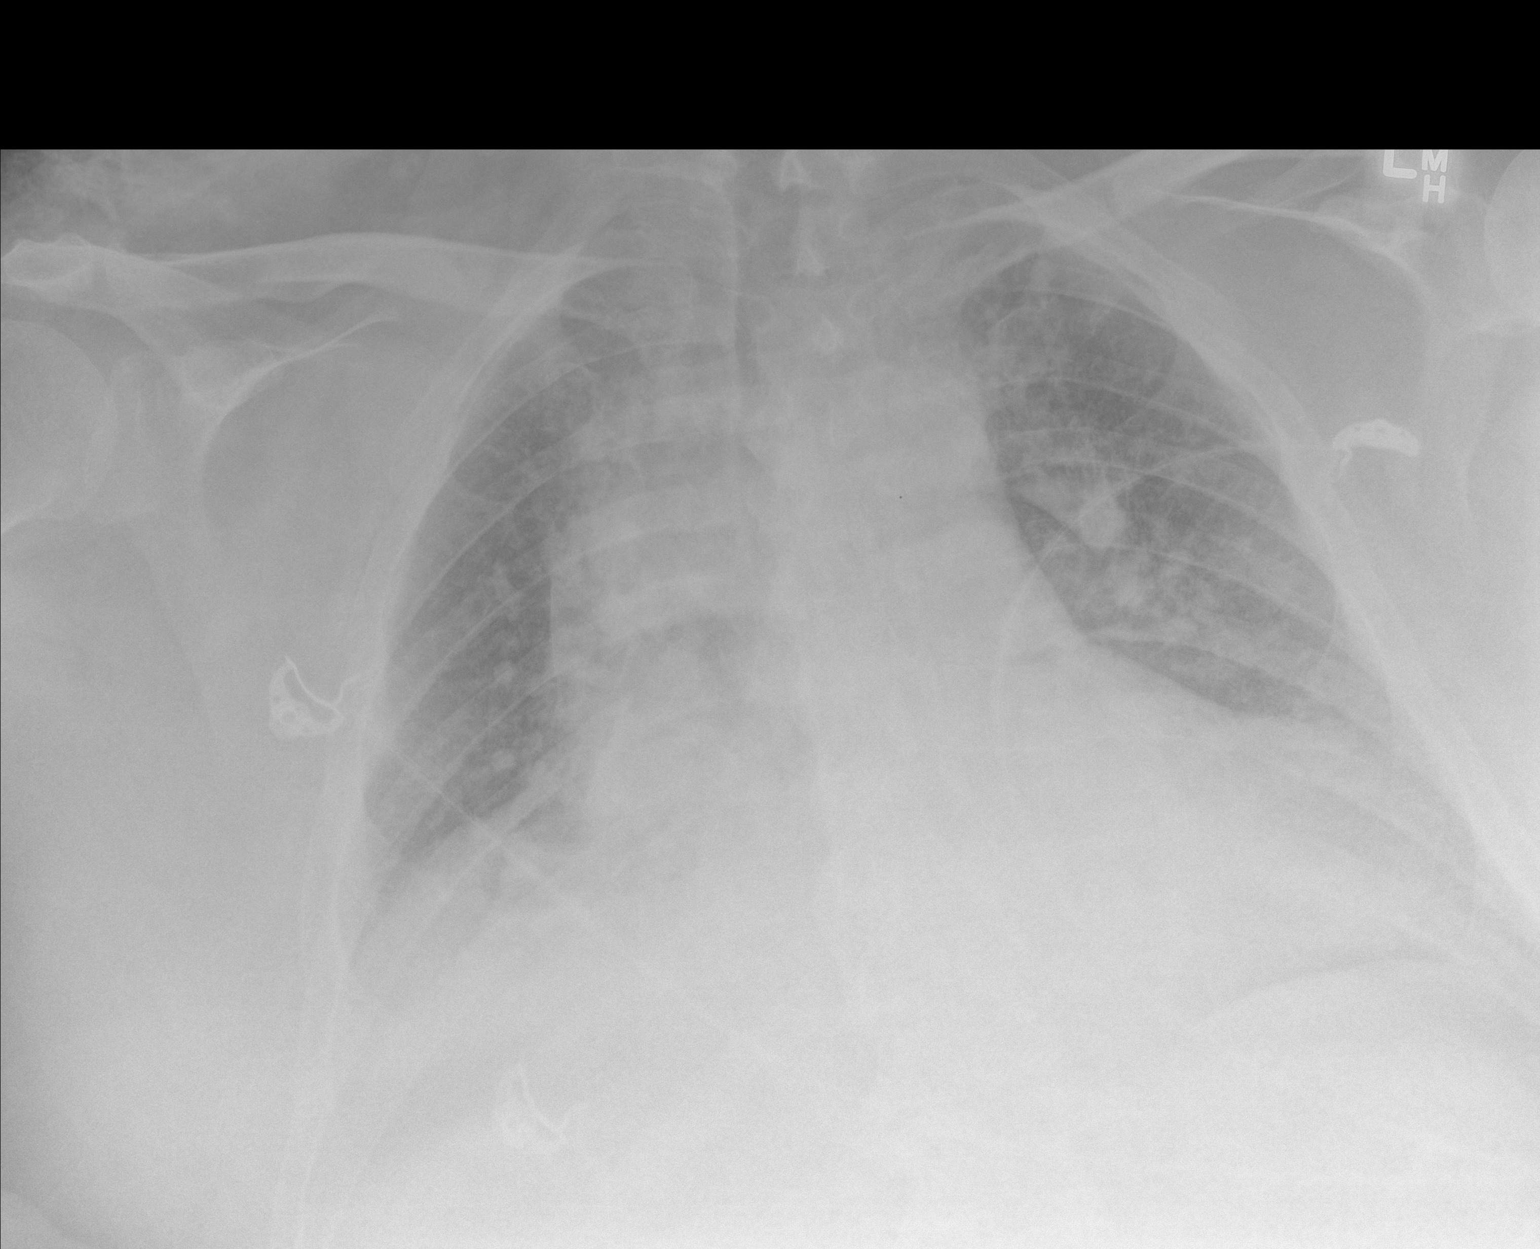

[1 of 1 positions shown; findings below may reference images not displayed]

FINDINGS: There is cardiomegaly. Pulmonary vascularity is normal. There
appears to be atelectasis at the right lung base. No discrete
effusions. No acute osseous abnormality.
IMPRESSION: Cardiomegaly.  Probable atelectasis at the right lung base.

## 2016-09-17 IMAGING — DX DG CHEST 1V PORT
1 series · 1 of 1 positions shown · non-contrast
Comparison: 12/28/2014 at [DATE] p.m.

CLINICAL DATA: Acute respiratory failure.  Difficulty intubation.

EXAM:
PORTABLE CHEST - 1 VIEW [DATE] a.m.

[chest ap]
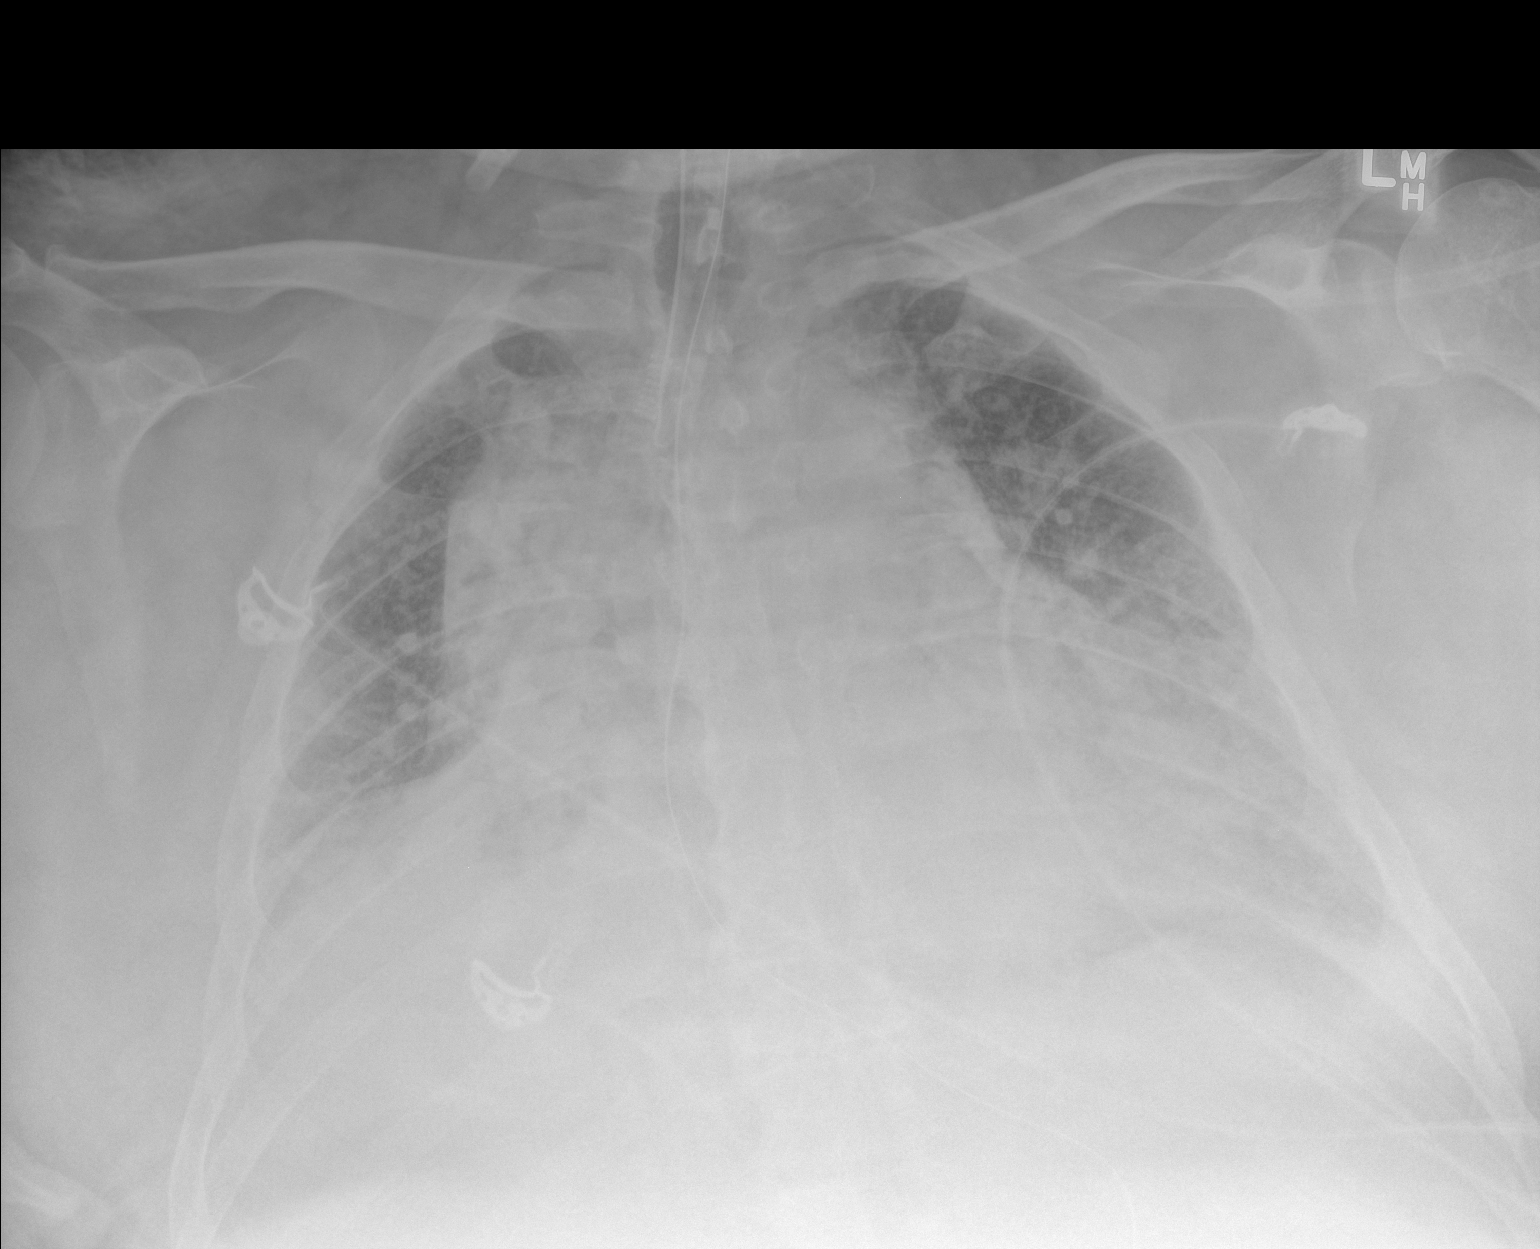

[1 of 1 positions shown; findings below may reference images not displayed]

FINDINGS: Endotracheal tube is at the T3-4 level in good position. OG tube tip
is below the diaphragm.

There is progression of the infiltrate on the right and there is new
perihilar infiltrate on the left. Chronic cardiomegaly. Pulmonary
vascularity remains normal.
IMPRESSION: Endotracheal tube in good position. Progressive right and new left
pulmonary infiltrates.

## 2016-09-18 IMAGING — DX DG CHEST 1V PORT
1 series · 1 of 1 positions shown · non-contrast
Comparison: 12/29/2014

CLINICAL DATA: Respiratory failure

EXAM:
PORTABLE CHEST - 1 VIEW

[chest ap]
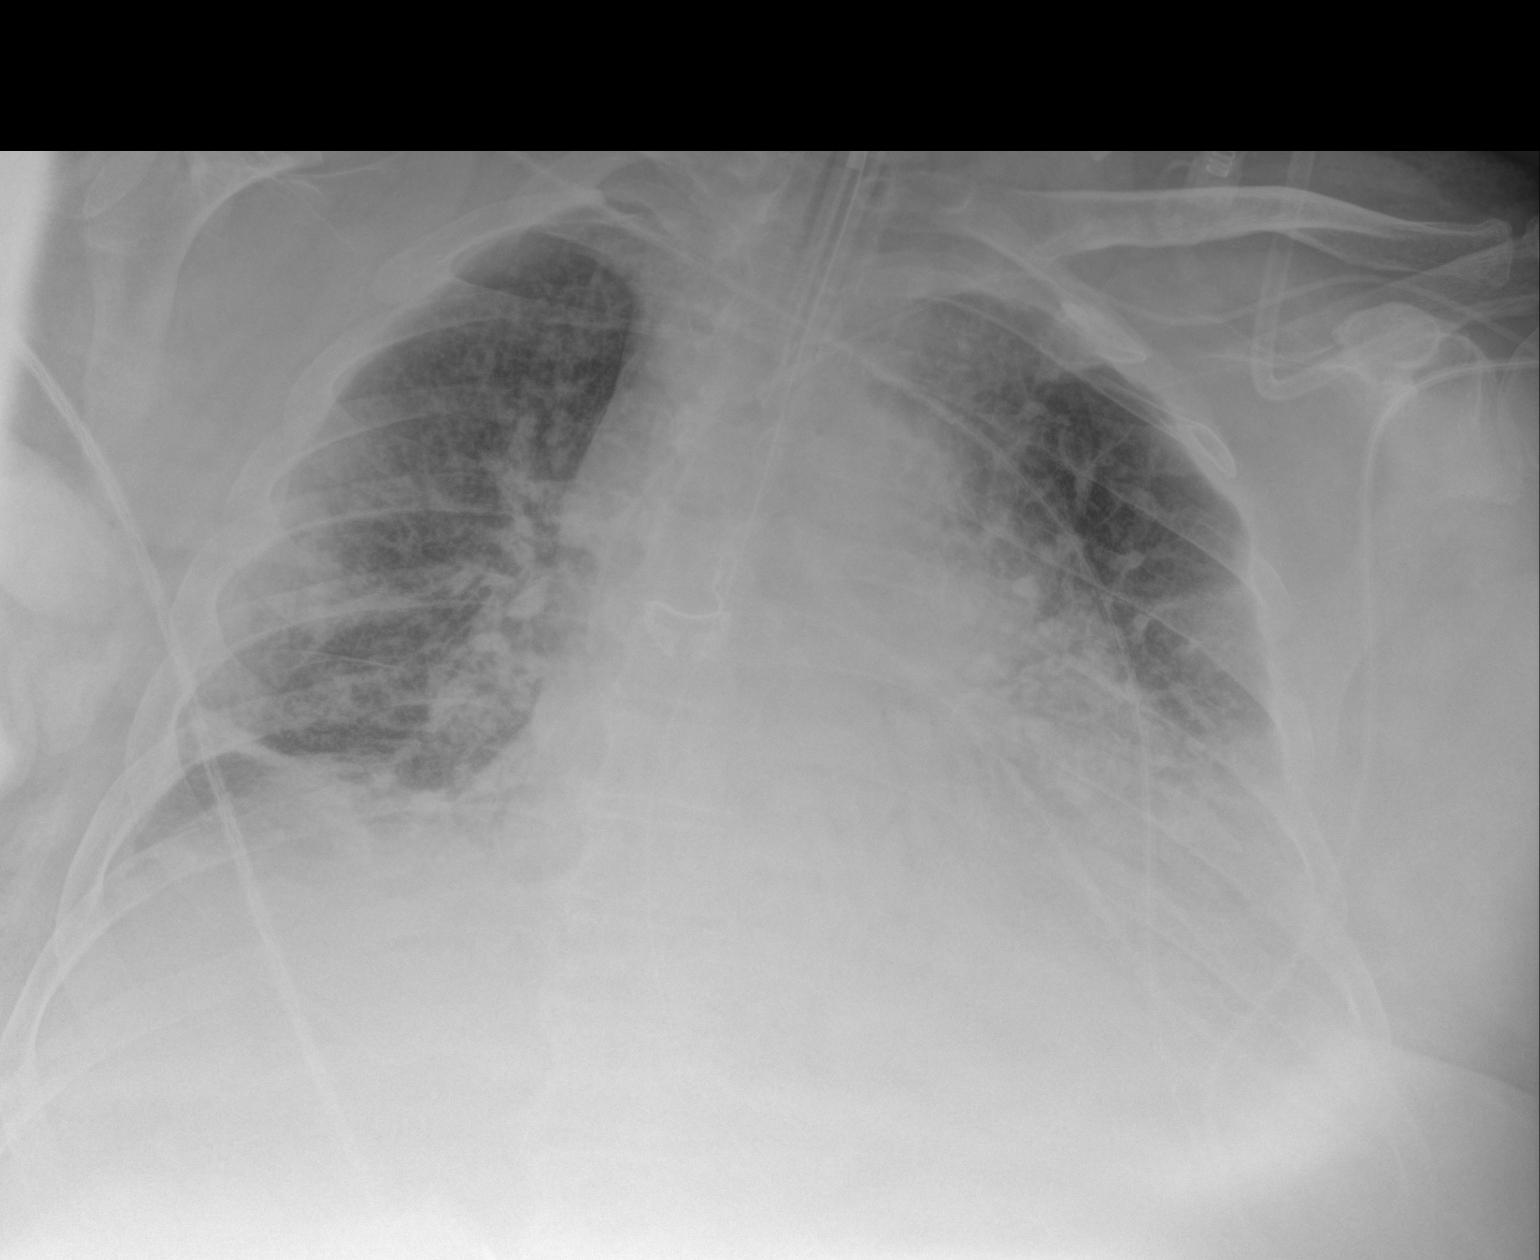

[1 of 1 positions shown; findings below may reference images not displayed]

FINDINGS: Endotracheal tube is appropriately positioned. Nasogastric tube tip
terminates below the level of the diaphragms but is not included in
the field of view. Moderate enlargement of the cardiac silhouette is
noted with stable hazy perihilar airspace opacities. Slight
improvement in right basilar aeration with stable right lower lobe
curvilinear presumed superimposed atelectasis. Left sided PICC line
tip over cavoatrial junction.
IMPRESSION: Stable cardiomegaly with hazy perihilar airspace opacities and
probable superimposed right lower lobe atelectasis. Edema,
pneumonia, ARDS, or other diffuse alveolar filling processes could
appear similar.

## 2016-09-19 IMAGING — CR DG CHEST 1V PORT
1 series · 1 of 1 positions shown · non-contrast
Comparison: 12/30/2014.

CLINICAL DATA: Respiratory failure.

EXAM:
PORTABLE CHEST - 1 VIEW

[AP]
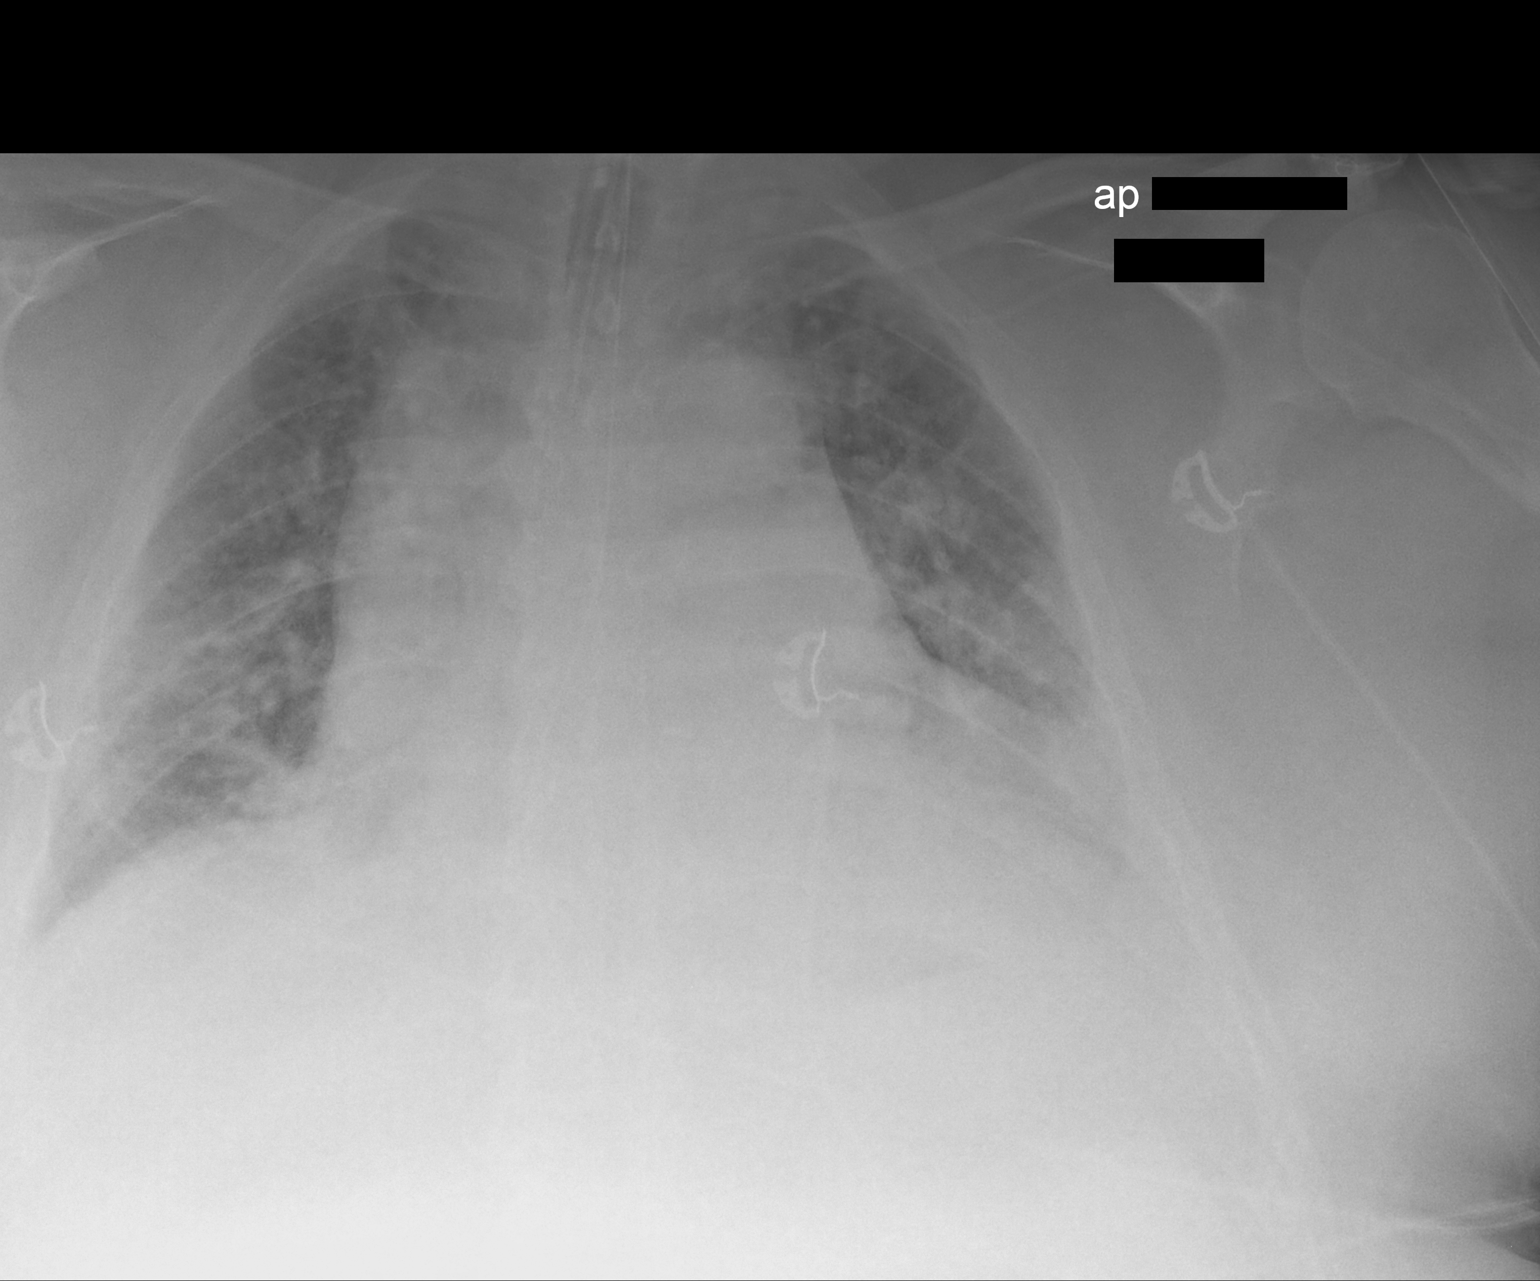

[1 of 1 positions shown; findings below may reference images not displayed]

FINDINGS: Endotracheal tube and NG tube in stable position. Left PICC line in
stable position. Persistent severe cardiomegaly. Pulmonary vascular
prominence and diffuse interstitial prominence noted. These findings
are consistent with persistent congestive heart failure. Tiny left
pleural effusion cannot be excluded. No pneumothorax.
IMPRESSION: 1. Lines and tubes in stable position.
2. Persistent cardiomegaly with diffuse interstitial prominence and
small left pleural effusion. These findings are consistent with
congestive heart failure.

## 2016-09-20 IMAGING — CR DG CHEST 1V PORT
1 series · 1 of 1 positions shown · non-contrast
Comparison: 12/31/2014 .

CLINICAL DATA: Respiratory failure.

EXAM:
PORTABLE CHEST - 1 VIEW

[AP]
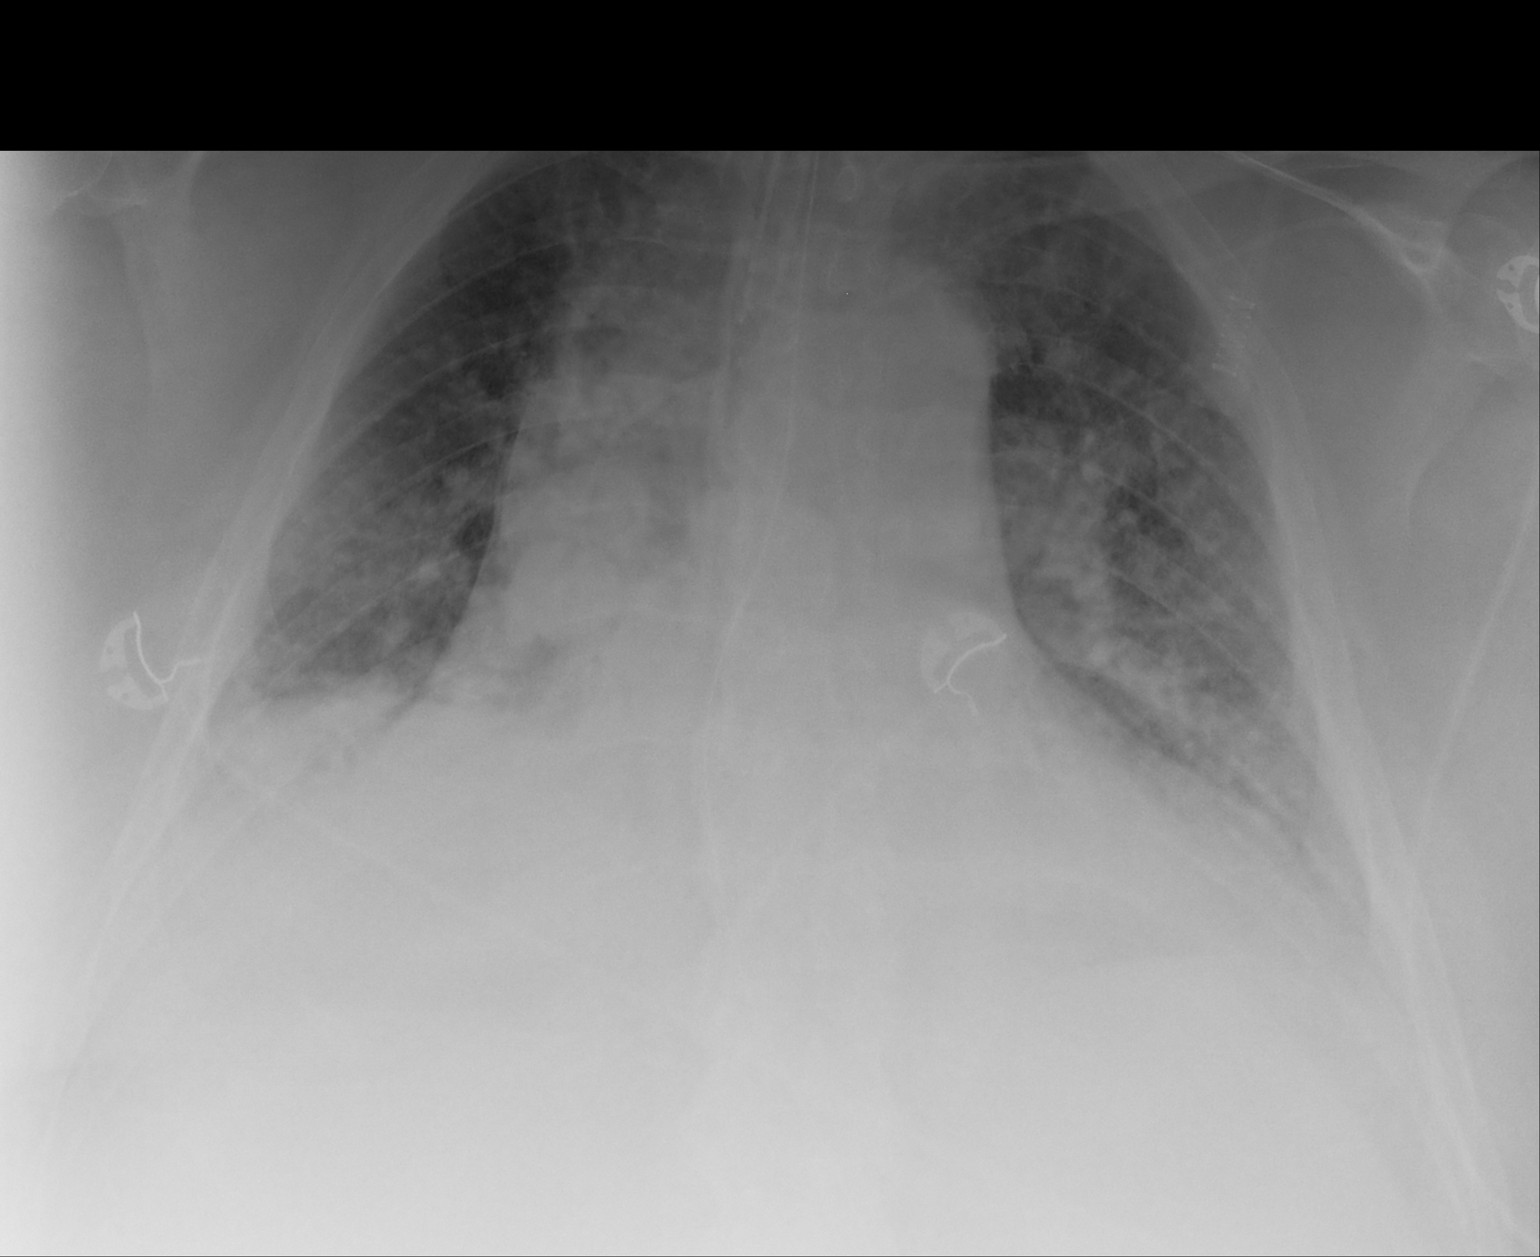

[1 of 1 positions shown; findings below may reference images not displayed]

FINDINGS: Endotracheal tube, NG tube, and left PICC line in stable position.
Cardiomegaly with diffuse bilateral pulmonary interstitial
prominence. Findings consistent congestive heart failure. Interim
clearing of left pleural effusion. No pneumothorax. No acute bony
abnormality P
IMPRESSION: 1. Lines and tubes in stable position.
2. Stable severe cardiomegaly with diffuse pulmonary interstitial
prominence consistent with congestive heart failure. Interim
clearing of small left pleural effusion.

## 2016-10-15 ENCOUNTER — Non-Acute Institutional Stay (SKILLED_NURSING_FACILITY): Payer: Self-pay | Admitting: Adult Health

## 2016-10-15 ENCOUNTER — Encounter: Payer: Self-pay | Admitting: Adult Health

## 2016-10-15 DIAGNOSIS — I110 Hypertensive heart disease with heart failure: Secondary | ICD-10-CM

## 2016-10-15 DIAGNOSIS — Z930 Tracheostomy status: Secondary | ICD-10-CM

## 2016-10-15 DIAGNOSIS — I5032 Chronic diastolic (congestive) heart failure: Secondary | ICD-10-CM

## 2016-10-15 DIAGNOSIS — Z95828 Presence of other vascular implants and grafts: Secondary | ICD-10-CM

## 2016-10-15 DIAGNOSIS — J9611 Chronic respiratory failure with hypoxia: Secondary | ICD-10-CM

## 2016-10-15 NOTE — Progress Notes (Signed)
Location:   [facility] Room Number: 141a  Place of Service:  SNF (31)    CODE STATUS: full code   No Known Allergies  Chief Complaint  Patient presents with  . Discharge Note    HPI:  After a prolonged admission to this facility he is going home with home health for pt/ot/rn/rt. He will need trach supplies and neb machine . He will need his prescriptions to be written and will need to follow up with his medical provider. He does have a long term trach in place. He is happy and excited to be going home.    Past Medical History:  Diagnosis Date  . Arthritis   . CHF (congestive heart failure) ([place])   . COPD (chronic obstructive pulmonary disease) ([place])   . Hyperlipidemia   . Hypertension   . Morbid obesity ([place])   . Multiple abrasions    rt arm  . RCT (rotator cuff tear)     Past Surgical History:  Procedure Laterality Date  . 25819    . NO PAST SURGERIES    . SHOULDER ARTHROSCOPY WITH SUBACROMIAL DECOMPRESSION, ROTATOR CUFF REPAIR AND BICEP TENDON REPAIR Right 12/28/2014   Procedure: RIGHT SHOULDER ARTHROSCOPY WITH SUBACROMIAL DECOMPRESSION, DISTAL CLAVICLE RESECTION, ROTATOR CUFF REPAIR ;  Surgeon: Sydnee Cabal, MD;  Location: [facility] ORS;  Service: Orthopedics;  Laterality: Right;  . TRACHEOSTOMY REVISION N/A 01/18/2015   Procedure: TRACHEOSTOMY REVISION;  Surgeon: Ruby Cola, MD;  Location: [facility] OR;  Service: ENT;  Laterality: N/A;  . TRACHEOSTOMY TUBE PLACEMENT N/A 01/16/2015   Procedure: TRACHEOSTOMY;  Surgeon: Jodi Marble, MD;  Location: [facility];  Service: ENT;  Laterality: N/A;  . TRACHEOSTOMY TUBE PLACEMENT N/A 01/22/2015   Procedure: TRACHEOSTOMY;  Surgeon: Jodi Marble, MD;  Location: [facility];  Service: ENT;  Laterality: N/A;    Social History   Social History  . Marital status: Single    Spouse name: N/A  . Number of children: N/A  . Years of education: N/A   Occupational History  . Not on file.   Social History Main Topics  . Smoking  status: Former Smoker    Packs/day: 0.20    Types: Cigarettes  . Smokeless tobacco: Former Systems developer  . Alcohol use No  . Drug use: No  . Sexual activity: Not on file   Other Topics Concern  . Not on file   Social History Narrative  . No narrative on file   Family History  Problem Relation Age of Onset  . Heart disease Mother   . Heart attack Mother   . Heart disease Brother     VITAL SIGNS BP 110/61   Pulse 78   Temp 98.1 F (36.7 C)   Resp 18   Ht 6' (1.829 m)   Wt 261 lb (118.4 kg)   SpO2 97%   BMI 35.40 kg/m   Patient's Medications  New Prescriptions   No medications on file  Previous Medications   ATORVASTATIN (LIPITOR) 20 MG TABLET    Take 20 mg by mouth daily.   CLONAZEPAM (KLONOPIN) 0.5 MG TABLET    Take 0.25 mg by mouth at bedtime.    DIPHENHYDRAMINE (BENADRYL) 25 MG TABLET    Take 25 mg by mouth every 6 (six) hours as needed for itching.   DOCUSATE SODIUM (COLACE) 100 MG CAPSULE    Take 1 capsule (100 mg total) by mouth 2 (two) times daily.   DULOXETINE (CYMBALTA) 60 MG CAPSULE  Take 60 mg by mouth daily.   FAMOTIDINE (PEPCID) 20 MG TABLET    Take 20 mg by mouth daily.   FOLIC ACID (FOLVITE) 1 MG TABLET    Take 1 mg by mouth daily.   HYDRALAZINE (APRESOLINE) 25 MG TABLET    Give 1.5 mg by mouth two times daily   IPRATROPIUM-ALBUTEROL (DUONEB) 0.5-2.5 (3) MG/3ML SOLN    Take 3 mLs by nebulization every 6 (six) hours as needed. For shortness of breath   ISOSORBIDE MONONITRATE (ISMO,MONOKET) 20 MG TABLET    Take 20 mg by mouth 2 (two) times daily at 10 AM and 5 PM.   LIDOCAINE (LIDODERM) 5 %    Place 1 patch onto the skin daily. Remove & Discard patch within 12 hours or as directed by MD To right shoulder   MOMETASONE (NASONEX) 50 MCG/ACT NASAL SPRAY    2 sprays daily.   OMEGA-3 FATTY ACIDS (FISH OIL) 1000 MG CAPS    Take 1,000 mg by mouth daily.    ONDANSETRON (ZOFRAN) 4 MG TABLET    Take 1 tablet (4 mg total) by mouth every 6 (six) hours as needed for nausea  or vomiting.   POTASSIUM (POTASSIMIN PO)    Take 40 mEq by mouth 4 (four) times daily.    RIVAROXABAN (XARELTO) 20 MG TABS TABLET    Take 20 mg by mouth daily with supper.   TORSEMIDE (DEMADEX) 10 MG TABLET    Take 10 mg by mouth daily.   TRIAMTERENE (DYRENIUM) 50 MG CAPSULE    Take 1 capsule (50 mg total) by mouth daily.  Modified Medications   No medications on file  Discontinued Medications   No medications on file     SIGNIFICANT DIAGNOSTIC EXAMS   01-01-15: 2-d echo: Left ventricle: The cavity size was normal. There was mild focal basal and mild concentric hypertrophy of the septum. Systolic function was normal. The estimated ejection fraction was in the range of 60% to 65%. Wall motion was normal; there were no regional wall motion abnormalities. Doppler parameters are consistent with abnormal left ventricular relaxation (grade 1 diastolic dysfunction). There was no evidence of elevate ventricular filling pressure by Doppler parameters. - Aortic valve: There was no regurgitation. - Aortic root: The aortic root was normal in size. - Mitral valve: Structurally normal valve. There was no regurgitation. - Right ventricle: Systolic function was normal. - Right atrium: The atrium was normal in size. - Tricuspid valve: There was trivial regurgitation. - Pulmonary arteries: Systolic pressure was within the normal range.  01-07-15: renal ultrasound: Limited examination demonstrating no acute abnormality. Negative for Hydronephrosis. Possible nonobstructing stone lower pole left kidney.  01-18-15: IVC filter placement: Successful IVC filter placement. This is temporary or can remain in place to become permanent.  01-28-15: ct of head: 1. No acute intracranial findings demonstrated. Mild atrophy and periventricular white matter disease. 2. Bilateral mastoid effusions with opacification of the left middle ear.  06-17-15: chest x-ray; Cardiomegaly with mild pulmonary vascular  congestion  06-19-15: ct guided prostate abscess drain: Fluid collection along the right side of the prostate compatible with an abscess. 30 mL of purulent fluid was removed.  06-24-15: ct of abdomen and pelvis: Fatty infiltration of the liver. Bilateral nonobstructive nephrolithiasis. Atherosclerosis of abdominal aorta without aneurysm formation. Sigmoid diverticulosis is noted without inflammation. Interval placement of transgluteal drainage catheter into right side of the pelvis. Fluid collection noted adjacent to prostate gland on prior exam has been completely decompressed.  06-25-15: kub:  Moderate ileus pattern.  07-11-16: neck x-ray: unremarkable soft neck tissue     LABS REVIEWED:   11-11-15: inr 2.26: coumadin 6 mg  12-02-15: INR 2.56 11-17-15: liver normal albumin 3.6; chol 154; ldl 92; trig 101; hdl 42  02-10-16: wbc 9.5; hgb 13.8; hct 40.4; mcv 82.3 ;plt 322; glucose 98; bun 35; creat 1.46; k+ 2.6; na++ 132; alk phos 174; albumin 3.9; chol 155; ldl 83; trig 115; hdl 49  02-13-16: glucose 112; bun 26; creat 1.25; k+ 2.7; na++ 132  02-14-16: wbc 9.4; hgb 14.2; hct 42.1; mcv 81.6; plt 326; glucose 99; bun 27 creat 1.42; k+ 3.0; na++ 133; alk phos 17 ast 33; alt 27; albumin 3.7  02-17-16: glucose 83; bun 31; creat 1.53; k+ 3.2; na++ 136  02-19-16: urine osmolality: 351;  02-20-16: glucose 93; bun 26; creat 1.59; k+ 3.8; na++ 133  02-25-16: glucose 88; bun 16; creat 1.24; k+ 5.0; na++ 138 02-27-16; glucose 101; bun 15; creat 1.16; k+ 4.5; na++ 139  03-02-16: INR 2.9: coumadin 6.5 mg daily  03-23-16: INR 1.8 03-30-16: INR 1.4  04-22-16: glucose 82; bun 23; creat 1.16; k+ 4.2; na++ 139; liver normal albumin 3.7; hgb a1c 4.8 04-23-16: urine micro-albumin 1.3 05-01-16: glucose 79; bun 22; creat 1.20; k+ 4.0; na++ 140;    Review of Systems Constitutional: Negative for appetite change and fatigue.  HENT: Negative for congestion.   Respiratory: Negative for cough, chest tightness and shortness of  breath.   Cardiovascular: Negative for chest pain, palpitations and leg swelling.  Gastrointestinal: Negative for nausea, abdominal pain, diarrhea and constipation.  Musculoskeletal: Negative for myalgias and arthralgias.  Skin: Negative for pallor.  Neurological: Negative for dizziness.  Psychiatric/Behavioral: The patient is not nervous/anxious.     Physical Exam Constitutional: He is oriented to person, place, and time. No distress. is obese  Eyes: Conjunctivae are normal.  Neck: Neck supple. No JVD present. No thyromegaly present.  Cardiovascular: Normal rate, regular rhythm and intact distal pulses.   Respiratory: Effort normal and breath sounds normal. No respiratory distress. He has no wheezes.  Has  trach   GI: Soft. Bowel sounds are normal. He exhibits no distension. There is no tenderness.    Musculoskeletal: no lower extremity edema   Able to move all extremities  Bilateral lower extremities discolored from impaired circulation Lymphadenopathy:    He has no cervical adenopathy.  Neurological: He is alert and oriented to person, place, and time.  Skin: Skin is warm and dry. He is not diaphoretic.   Psychiatric: He has a normal mood and affect.     ASSESSMENT/ PLAN:  Patient is being discharged with the following home health services:  Pt/ot/rn/rt: to evaluate and treat as indicated for gait balance strength adl training medication management and trach management   Patient is being discharged with the following durable medical equipment:  #4 disposable shiley trach with trach supplies; neb machine    Patient has been advised to f/u with their PCP in 1-2 weeks to bring them up to date on their rehab stay.  Social services at facility was responsible for arranging this appointment.  Pt was provided with a 30 day supply of prescriptions for medications and refills must be obtained from their PCP.  For controlled substances, a more limited supply may be provided adequate until  PCP appointment only. #10 klonopin 0.5 mg tabs.    Time spent with patient  45  minutes >50% time spent counseling; reviewing medical record; tests; labs; and  developing future plan of care    Ok Edwards NP [facility]  Contact [phone] Monday through Friday 8am- 5pm  After hours call [phone]

## 2016-10-26 ENCOUNTER — Other Ambulatory Visit: Payer: Self-pay | Admitting: Acute Care

## 2016-10-26 DIAGNOSIS — Z930 Tracheostomy status: Secondary | ICD-10-CM

## 2016-10-28 ENCOUNTER — Ambulatory Visit (HOSPITAL_COMMUNITY)
Admission: RE | Admit: 2016-10-28 | Discharge: 2016-10-28 | Disposition: A | Discharge status: Home / Self Care | Payer: Self-pay | Source: Ambulatory Visit | Attending: Acute Care | Admitting: Acute Care

## 2016-10-28 DIAGNOSIS — Z930 Tracheostomy status: Secondary | ICD-10-CM

## 2016-10-28 DIAGNOSIS — Z720 Tobacco use: Secondary | ICD-10-CM | POA: Insufficient documentation

## 2016-10-28 DIAGNOSIS — I5032 Chronic diastolic (congestive) heart failure: Secondary | ICD-10-CM | POA: Insufficient documentation

## 2016-10-28 DIAGNOSIS — G4733 Obstructive sleep apnea (adult) (pediatric): Secondary | ICD-10-CM | POA: Insufficient documentation

## 2016-10-28 DIAGNOSIS — G4731 Primary central sleep apnea: Secondary | ICD-10-CM | POA: Insufficient documentation

## 2016-10-28 DIAGNOSIS — Z430 Encounter for attention to tracheostomy: Secondary | ICD-10-CM | POA: Insufficient documentation

## 2016-10-28 NOTE — Progress Notes (Signed)
Tracheostomy Procedure Note  Fayrene FearingJames Colglazier [id number] 08/02/1951  Pre Procedure Tracheostomy Information  Trach Brand: Shiley Size: 5.0 Style: Distal Secured by: Velcro  HR 88 RR 12 SpO2 96 BP 116/65  Procedure:LIdoncane neb given through trach. Bronchoscope passed through trach to visualize carina. Pt tolerated well.    Post Procedure Tracheostomy Information  Trach Brand: Bivona Size: 6.0 Style: Uncuffed Secured by: Velcro   Post Procedure Evaluation:  ETCO2 positive color change from yellow to purple : No. Visualized with bronchoscope Vital signs: pulse 86, respirations 12 and pulse oximetry 96 % Patients [place] condition: stable Complications: No apparent complications Trach site exam: clean Wound care done: dry Patient did tolerate procedure well.   Education:   Prescription needs:     Additional needs: 10 week follow up

## 2016-10-28 NOTE — Progress Notes (Signed)
  ROV  Subjective:    Patient ID: [name], male    DOB: 11/30/1950, 66 y.o.   MRN: [id number]  HPI [name] is well known to me w/ a h/o severe central and obstructive sleep apnea, diastolic heart failure, tobacco abuse, CPAP intolerance and tracheostomy status. He returns today for routine trach follow-up.    Review of Systems  Constitutional: Negative.   HENT: Negative.   Eyes: Negative.   Respiratory: Negative.   Cardiovascular: Negative.   Gastrointestinal: Negative.   Endocrine: Negative.   Genitourinary: Negative.   Musculoskeletal: Negative.   Neurological: Negative.     HR 88 RR 12 SpO2 96 BP 116/65    Objective:   Physical Exam  Constitutional: He is oriented to person, place, and time. He appears well-developed and well-nourished. No distress.  HENT:  Head: Normocephalic.  Mouth/Throat: Oropharynx is clear and moist. No oropharyngeal exudate.  Eyes: Conjunctivae are normal. Pupils are equal, round, and reactive to light. Right eye exhibits no discharge. Left eye exhibits no discharge. No scleral icterus.  Neck: Normal range of motion. No JVD present. No tracheal deviation present.  Trach stoma unremarkable   Cardiovascular: Normal rate and regular rhythm.   Pulmonary/Chest: Effort normal and breath sounds normal. No respiratory distress. He has no wheezes. He has no rales.  Abdominal: Soft. Bowel sounds are normal. He exhibits no distension.  Musculoskeletal: Normal range of motion. He exhibits no edema.  Neurological: He is alert and oriented to person, place, and time.  Skin: Skin is warm and dry. No rash noted. He is not diaphoretic. No erythema.  Psychiatric: He has a normal mood and affect. His behavior is normal.   Procedure: Bronchoscopy eval.  Pt was pre-medicated w/ inhaled lidocaine. Scope was passed through trach stoma and there was plenty of room from the tip of the trach to the carina. After that the new #6 fixed XLT bivona was placed. Again this  was evaluated bronchoscopically to assure adequate positioning w/ excellent space between trach tip and carina. The trach was then fixed in place w/ trach ties.     Assessment & Plan:  Severe OSA and central sleep apnea  Tracheostomy status Tobacco abuse  Tracheal granulation  Tracheal malacia  Chronic diastolic HF Bilateral DVT In-tolerant of CPAP   Discussion [name] is looking good. He is now at home and doing well. I was disappointed to hear that he has taken up smoking again. He says he will try to stop but I doubt he will. I spent time with him explaining again the dangers of smoking and have again advised him to stop. We changed his trach to a XL bivona fixed size 6. Primarily to make it easier for him to manage at home. Have ordered him aerosol trach collar and instructed him to wear it at night.   Plan Cont routine trach care ROV  ~10 weeks  Humidified ATC at night Stop smoking  I spent 50 minutes of time with the patient and providing direct care and council   Simonne MartinetPeter E Babcock ACNP-BC [facility] Pul[facility]/Critical Care Pager # [phone] OR # [phone] if no answer

## 2016-11-02 ENCOUNTER — Other Ambulatory Visit: Payer: Self-pay | Admitting: Adult Health

## 2016-11-19 ENCOUNTER — Ambulatory Visit (HOSPITAL_COMMUNITY)
Admission: RE | Admit: 2016-11-19 | Discharge: 2016-11-19 | Disposition: A | Discharge status: Home / Self Care | Payer: Self-pay | Source: Ambulatory Visit | Attending: Acute Care | Admitting: Acute Care

## 2016-11-19 DIAGNOSIS — Z930 Tracheostomy status: Secondary | ICD-10-CM

## 2016-11-19 DIAGNOSIS — Z9119 Patient's noncompliance with other medical treatment and regimen: Secondary | ICD-10-CM | POA: Insufficient documentation

## 2016-11-19 DIAGNOSIS — G4733 Obstructive sleep apnea (adult) (pediatric): Secondary | ICD-10-CM | POA: Insufficient documentation

## 2016-11-19 DIAGNOSIS — J449 Chronic obstructive pulmonary disease, unspecified: Secondary | ICD-10-CM | POA: Insufficient documentation

## 2016-11-19 DIAGNOSIS — Z430 Encounter for attention to tracheostomy: Secondary | ICD-10-CM | POA: Insufficient documentation

## 2016-11-19 DIAGNOSIS — J9509 Other tracheostomy complication: Secondary | ICD-10-CM | POA: Insufficient documentation

## 2016-11-19 NOTE — Progress Notes (Signed)
Tracheostomy Procedure Note  [name] [name] [id number] Jun 25, 1951  Pre Procedure Tracheostomy Information  Trach Brand: Bivona Size: 6.0 Style: Uncuffed Secured by: Velcro   Procedure: Lidocaine neb given with 6 cc of 1% lidocaine.Trach evaluation and visualization don, trach change and Bronchoscope and new passy muir valve given to patient. BP 128/79, RR 20, HR 83  02sats 95% on RA    Post Procedure Tracheostomy Information  Trach Brand: Bivona Size 6.0:  Style:Bivona Secured by: Velcro    Post Procedure Evaluation:  ETCO2 positive color change from yellow to purple : Yes.   Vital signs:blood pressure 138/75, pulse 86, respirations 85 and pulse oximetry 98 % Patients [place] condition: stable Complications: No apparent complications Trach site exam: clean Wound care done: dry Patient did tolerate procedure well.   Education:  Home health called to set up and instruct patient with humidification unit  Prescription needs: no    Additional needs: none

## 2016-11-20 NOTE — Progress Notes (Signed)
   Subjective:  Trach "trouble"  Patient ID: [name], male    DOB: 1951/07/21, 66 y.o.   MRN: [id number]  HPI  [name] is well known to me. Has h/o severe OSA, COPD, active smoking and some degree of medical non-compliance. I recently changed [name] to an Extra-long fixed Bivona trach as trach to make trach management easier. He called today reporting that he was having trouble with his trach. Was a little more short of breath and was concerned that it was occluded essentially. I advised him he needed to be seen. On further discussion he has not been using his Trach collar at night.     Review of Systems  Constitutional: Negative.   HENT: Positive for congestion. Negative for nosebleeds.   Eyes: Negative.   Respiratory: Positive for shortness of breath. Negative for wheezing.   Cardiovascular: Negative.   Gastrointestinal: Negative.   Endocrine: Negative.   Genitourinary: Negative.   Musculoskeletal: Negative.   Allergic/Immunologic: Negative.   Neurological: Negative.   Hematological: Negative.   Psychiatric/Behavioral: Negative.        Objective:   Physical Exam  Constitutional: He is oriented to person, place, and time. He appears well-developed and well-nourished. No distress.  HENT:  Head: Normocephalic and atraumatic.  Mouth/Throat: Oropharynx is clear and moist.  Eyes: Conjunctivae and EOM are normal. Pupils are equal, round, and reactive to light.  Neck: Normal range of motion.  [name] was occluded.  (see bronch eval) Otherwise no discharge or drainage.   Cardiovascular: Normal rate, regular rhythm and normal heart sounds.   Pulmonary/Chest: Effort normal and breath sounds normal. No respiratory distress. He has no wheezes.  Abdominal: Soft. Bowel sounds are normal.  Musculoskeletal: Normal range of motion.  Neurological: He is alert and oriented to person, place, and time.  Skin: Skin is warm and dry. He is not diaphoretic.  Psychiatric: He has a normal mood and  affect.  Vitals reviewed.   Bronchscopic eval The trach was completely occluded w/ mucous. I could not initially pass Bronch through. I replaced his trach w/ new trach. At that point visual evaluation to level of carina was carried out. He had no secretions, no ulcerations or findings for concern.     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  I called [facility] to help him set this up I will check on him next week to see his progress.  Othewise ROV 8-10 weeks   [name] ACNP-BC [facility] Pulmonary/Critical Care Pager # [phone] OR # [phone] if no answer

## 2017-01-06 ENCOUNTER — Ambulatory Visit (HOSPITAL_COMMUNITY): Payer: Self-pay

## 2017-01-13 ENCOUNTER — Ambulatory Visit (HOSPITAL_COMMUNITY)
Admission: RE | Admit: 2017-01-13 | Discharge: 2017-01-13 | Disposition: A | Discharge status: Home / Self Care | Payer: BLUE CROSS/BLUE SHIELD | Source: Ambulatory Visit | Attending: Acute Care | Admitting: Acute Care

## 2017-01-13 DIAGNOSIS — Z930 Tracheostomy status: Secondary | ICD-10-CM | POA: Insufficient documentation

## 2017-01-13 NOTE — Progress Notes (Signed)
Tracheostomy Procedure Note  Fayrene FearingJames Seedorf [id number] [birth year]  Pre Procedure Tracheostomy Information  Trach Brand: Bivona Size: 6 xl Style: Uncuffed Secured by: Velcro   Procedure: trach change    Post Procedure Tracheostomy Information  Trach Brand: Bivona Aire-Cuf Size: 6 xl Style: Uncuffed Secured by: Velcro   Post Procedure Evaluation:  ETCO2 positive color change from yellow to purple : Yes.   Vital signs:blood pressure 128/68, pulse 90, respirations 18 and pulse oximetry 97% on RA Patients [place] condition: stable Complications: No apparent complications Trach site exam: clean, dry Wound care done: 4 x 4 gauze Patient did tolerate procedure well.   Education: None  See back in trach clinic in 1 month

## 2017-01-14 DIAGNOSIS — Z930 Tracheostomy status: Secondary | ICD-10-CM | POA: Insufficient documentation

## 2017-01-14 NOTE — Progress Notes (Signed)
   Subjective:  ROV   Patient ID: [name], male    DOB: 1951/02/05, 66 y.o.   MRN: [id number]  HPI [name] returns today for routine trach care. Doing well w/out any acute complaints. Does feel as thought the trach is "clogging up". He attributes this to not using nocturnal humidification which I agree. He is not having any new cough, shortness of breath, mucous production or acute issues.    Review of Systems  All other systems reviewed and are negative.    Vital signs:blood pressure 128/68, pulse 90, respirations 18 and pulse oximetry 97% on RA    Objective:   Physical Exam  Constitutional: He is oriented to person, place, and time. He appears well-developed and well-nourished.  HENT:  Head: Normocephalic and atraumatic.  Mouth/Throat: Oropharynx is clear and moist.  Eyes: Conjunctivae and EOM are normal. Pupils are equal, round, and reactive to light. Right eye exhibits no discharge.  Neck: Normal range of motion. Neck supple.  [name] site is unremarkable   Cardiovascular: Normal rate and regular rhythm.   Murmur heard. Pulmonary/Chest: Effort normal and breath sounds normal. No respiratory distress.  Abdominal: Soft. Bowel sounds are normal. He exhibits no distension. There is no tenderness.  Musculoskeletal: Normal range of motion. He exhibits edema and tenderness. He exhibits no deformity.  Neurological: He is alert and oriented to person, place, and time.  Skin: Skin is warm and dry. He is not diaphoretic.  Psychiatric: He has a normal mood and affect.      Trach BrandModena Jansky: Bivona Size: 6 xl Style: Uncuffed Secured by: Velcro  Assessment & Plan:  Tracheostomy dependence  OSA Tobacco abuse  Obesity  Discussion  No sig change. [name] and I did discuss the fact that his smoking is harmful and again I have advised him to stop. I am doubtful that he will. He is actually doing fairly well. He manages his trach well. His primary issue is that he is not using his humidifier.  Says he needs someone to show him how to set it up. I do think if he was using nocturnal humidification his trach would clog less.   Plan Will change trach q 4 weeks Will contact DME to see if we can get someone out there to help him w/ humidification   Simonne MartinetPeter E Jesalyn Finazzo ACNP-BC [facility] Pulmonary/Critical Care Pager # [phone] OR # [phone] if no answer

## 2017-02-10 ENCOUNTER — Ambulatory Visit (HOSPITAL_COMMUNITY)
Admission: RE | Admit: 2017-02-10 | Discharge: 2017-02-10 | Disposition: A | Discharge status: Home / Self Care | Payer: Self-pay | Source: Ambulatory Visit | Attending: Acute Care | Admitting: Acute Care

## 2017-02-10 DIAGNOSIS — F17200 Nicotine dependence, unspecified, uncomplicated: Secondary | ICD-10-CM | POA: Insufficient documentation

## 2017-02-10 DIAGNOSIS — G4733 Obstructive sleep apnea (adult) (pediatric): Secondary | ICD-10-CM | POA: Insufficient documentation

## 2017-02-10 DIAGNOSIS — J309 Allergic rhinitis, unspecified: Secondary | ICD-10-CM | POA: Insufficient documentation

## 2017-02-10 DIAGNOSIS — Z430 Encounter for attention to tracheostomy: Secondary | ICD-10-CM | POA: Insufficient documentation

## 2017-02-10 DIAGNOSIS — Z930 Tracheostomy status: Secondary | ICD-10-CM

## 2017-02-10 NOTE — Progress Notes (Signed)
   Subjective:  ROV trach change   Patient ID: [name], male    DOB: 02/23/1951, 66 y.o.   MRN: [id number]  HPI [name] returns today for routine trach change. He is trach dependent d/t MO, OSA and non-compliance w/ CPAP in past. He unfortunately still smokes (we address this every visit). In spite of this he is doing well. He does have his humidification finally for his trach. He did have one event where he awoke feeling his trach was plugged but he was able to remedy this w/ a Q-tip. We spoke about that and I recommended we get him a trach suction machine but he tells be he will not use it. He also tells me he has seasonal allergies and has noted an up-tick in his post-nasal gtt.    Review of Systems  Constitutional: Negative.   HENT: Positive for congestion, postnasal drip, rhinorrhea and sinus pressure. Negative for dental problem, drooling, facial swelling, mouth sores, nosebleeds and sore throat.        Intermittent post-nasal gtt.    Eyes: Negative.   Respiratory: Negative.   Cardiovascular: Negative.   Gastrointestinal: Negative.   Endocrine: Negative.   Musculoskeletal: Negative.   Allergic/Immunologic: Positive for environmental allergies.  Neurological: Negative.   Hematological: Negative.   Psychiatric/Behavioral: Negative.       HR 105, rr 17, bp 147/99 sats 95% room air w/ PMV in place.  Objective:   Physical Exam  Constitutional: He is oriented to person, place, and time. He appears well-developed and well-nourished. No distress.  HENT:  Head: Normocephalic and atraumatic.  Mouth/Throat: Oropharynx is clear and moist.  Eyes: EOM are normal. Pupils are equal, round, and reactive to light.  Neck: Normal range of motion.  Trach stoma is clean.  His dressing is fouls smelling. The trach is caked w/ old secretions.   Cardiovascular: Normal rate and regular rhythm.   Pulmonary/Chest: Effort normal and breath sounds normal. No respiratory distress.  Abdominal: Soft.  Bowel sounds are normal. He exhibits no distension.  Musculoskeletal: Normal range of motion. He exhibits no edema.  Neurological: He is alert and oriented to person, place, and time.  Skin: Skin is warm and dry. He is not diaphoretic.  Psychiatric: He has a normal mood and affect. His behavior is normal.   Procedure  Brovana 6 XLT changed.     Assessment & Plan:  Tracheostomy dependence  Severe OSA Allergic Rhinitis  Tobacco abuse   Discussion Today we performed routine trach change. In regards to his trach there are no new findings or recommendations. We discussed his post-nasal gtt which I advised him to continue his Flonase, we also again talked about smoking cessation. [name] remains noncommittal to stopping.   Plan ROV 4 weeks for trach change.   Simonne MartinetPeter E Jazsmine Macari ACNP-BC [facility] Pulmonary/Critical Care Pager # [phone] OR # [phone] if no answer

## 2017-02-10 NOTE — Progress Notes (Signed)
Tracheostomy Procedure Note  Nicholas LiesJames [name] [id number] 05-08-1951  Pre Procedure Tracheostomy Information  Vital pre trach change HR 105 SpO2 95 RR 17 BP 147/99 Trach Brand: Bivona Size: 6.0 XLT Style: Uncuffed Secured by: Velcro   Procedure: trach change    Post Procedure Tracheostomy Information  Trach Brand: Bivona Size: 6.0 XLT Style: Uncuffed Secured by: Velcro   Post Procedure Evaluation:  ETCO2 positive color change from yellow to purple : Yes.   Vital signs:blood pressure 145/90, pulse 105, respirations 17 and pulse oximetry 96 % Patients [name] condition: stable Complications: No apparent complications Trach site exam: clean, dry Wound care done: dry Patient did tolerate procedure well.   Education: none  Prescription needs: none    Additional needs: Follow up in 3 weeks

## 2017-03-03 ENCOUNTER — Ambulatory Visit (HOSPITAL_COMMUNITY)
Admission: RE | Admit: 2017-03-03 | Discharge: 2017-03-03 | Disposition: A | Discharge status: Home / Self Care | Payer: Self-pay | Source: Ambulatory Visit | Attending: Acute Care | Admitting: Acute Care

## 2017-03-03 DIAGNOSIS — J9610 Chronic respiratory failure, unspecified whether with hypoxia or hypercapnia: Secondary | ICD-10-CM | POA: Insufficient documentation

## 2017-03-03 DIAGNOSIS — Z930 Tracheostomy status: Secondary | ICD-10-CM

## 2017-03-03 DIAGNOSIS — F17210 Nicotine dependence, cigarettes, uncomplicated: Secondary | ICD-10-CM | POA: Insufficient documentation

## 2017-03-03 DIAGNOSIS — Z430 Encounter for attention to tracheostomy: Secondary | ICD-10-CM | POA: Insufficient documentation

## 2017-03-03 DIAGNOSIS — Z720 Tobacco use: Secondary | ICD-10-CM

## 2017-03-03 DIAGNOSIS — G4733 Obstructive sleep apnea (adult) (pediatric): Secondary | ICD-10-CM | POA: Insufficient documentation

## 2017-03-03 DIAGNOSIS — R600 Localized edema: Secondary | ICD-10-CM | POA: Insufficient documentation

## 2017-03-03 NOTE — Progress Notes (Signed)
Tracheostomy Procedure Note  Fayrene FearingJames Ocanas [id number] 1951/05/28  Pre Procedure Tracheostomy Information  Trach Brand: Bivona Size: 6.0XL Style: Uncuffed Secured by: Velcro   Procedure: trach change    Post Procedure Tracheostomy Information  Trach Brand: Bivona Size: 6.0XL Style: Uncuffed Secured by: Velcro   Post Procedure Evaluation:  ETCO2 positive color change from yellow to purple : Yes.   Vital signs:blood pressure 118/70, pulse 89, respirations 17 and pulse oximetry 93 % Patients [place] condition: stable Complications: No apparent complications Trach site exam: clean, dry Wound care done: 4 x 4 gauze Patient did tolerate procedure well.   Education: none  Prescription needs: none    Additional needs: Follow up in 4 weeks

## 2017-03-03 NOTE — Progress Notes (Signed)
   Subjective:  ROV   Patient ID: [name] [name], male    DOB: Aug 29, 1951, 66 y.o.   MRN: [id number]  HPI Nicholas MarshallJimmy is well known to me has h/o severe OSA, and tracheostomy dependence. I follow him for trach management about every 5 weeks.   Review of Systems  Constitutional: Negative for activity change, appetite change and fatigue.  HENT: Negative for congestion and postnasal drip.   Eyes: Negative.   Respiratory: Negative.   Cardiovascular: Negative.   Gastrointestinal: Negative.   Endocrine: Negative.   Genitourinary: Negative.   Musculoskeletal: Positive for back pain.  Neurological: Negative.   Hematological: Negative.   Psychiatric/Behavioral: Negative.       HR 89 bp 118/70 rr 17 sats 93%  Objective:   Physical Exam  Constitutional: He is oriented to person, place, and time. He appears well-developed and well-nourished.  HENT:  Head: Normocephalic and atraumatic.  Mouth/Throat: No oropharyngeal exudate.  Eyes: Conjunctivae are normal. Pupils are equal, round, and reactive to light.  Neck: Normal range of motion.  Trach site: stoma itself is clean. Trach w/ thick yellow/brown secretions   Cardiovascular: Normal rate, regular rhythm and normal heart sounds.   Pulmonary/Chest: Effort normal. He has decreased breath sounds.  Abdominal: Soft. Normal appearance. There is no tenderness.  Musculoskeletal:  Marked lower extremity swelling   Neurological: He is alert and oriented to person, place, and time.  Skin: Skin is warm, dry and intact. He is not diaphoretic.  Psychiatric: He has a normal mood and affect. His speech is normal and behavior is normal. Thought content normal.      Assessment & Plan:  Chronic respiratory failure Severe OSA Tracheostomy dependence  Tobacco abuse  Lower extremity edema   Discussion Nicholas MarshallJimmy is doing well from a trach stand-point. My major concern in regards to his airway is the specialty trach and the fact that he still smokes AND the fact  that he isn't using his humidification system. He also today asked me about the swelling in his lower extremities. I suspect that he needs to have his diuretics titrated up but would not want to do this with-out seeing his lab work. I advised him to see his PCP, but also in the mean time council him on lowering his sodium intake (his average diet consists of barbeque, canned soup, bo-jangles, and fried chicken; tells me he doesn't eat anything green").   Plan ROV 4 weeks Advised him to see his PCP-->likely needs increased diuretic but would be hesitant to do this w/out having lab work readily available Asked him again to cut back on smoking. He said he would try to cut back at least 2 cigarettes a day  Will see about getting him a HME device to go on his trach as he has not been using his humidifier at home and still has trouble w/ mucous plugging.  Simonne MartinetPeter E Vernadette Stutsman ACNP-BC [facility]/Critical [facility] Pager # [phone] OR # [phone] if no answer

## 2017-03-31 ENCOUNTER — Inpatient Hospital Stay (HOSPITAL_COMMUNITY): Admission: RE | Admit: 2017-03-31 | Payer: Self-pay | Source: Ambulatory Visit

## 2017-04-08 ENCOUNTER — Ambulatory Visit (HOSPITAL_COMMUNITY): Payer: Self-pay

## 2017-04-14 ENCOUNTER — Inpatient Hospital Stay (HOSPITAL_COMMUNITY): Admission: RE | Admit: 2017-04-14 | Payer: Self-pay | Source: Ambulatory Visit

## 2017-04-15 ENCOUNTER — Ambulatory Visit (HOSPITAL_COMMUNITY): Payer: Self-pay

## 2017-04-24 ENCOUNTER — Emergency Department (HOSPITAL_COMMUNITY): Payer: Medicaid Other

## 2017-04-24 ENCOUNTER — Encounter (HOSPITAL_COMMUNITY): Payer: Self-pay | Admitting: *Deleted

## 2017-04-24 ENCOUNTER — Inpatient Hospital Stay (HOSPITAL_COMMUNITY)
Admission: EM | Admit: 2017-04-24 | Discharge: 2017-05-25 | DRG: 871 | Disposition: A | Discharge status: Skilled Nursing Facility | Payer: Medicaid Other | Attending: Internal Medicine | Admitting: Internal Medicine

## 2017-04-24 DIAGNOSIS — I5032 Chronic diastolic (congestive) heart failure: Secondary | ICD-10-CM | POA: Diagnosis present

## 2017-04-24 DIAGNOSIS — E662 Morbid (severe) obesity with alveolar hypoventilation: Secondary | ICD-10-CM | POA: Diagnosis present

## 2017-04-24 DIAGNOSIS — I82509 Chronic embolism and thrombosis of unspecified deep veins of unspecified lower extremity: Secondary | ICD-10-CM | POA: Diagnosis present

## 2017-04-24 DIAGNOSIS — Z8249 Family history of ischemic heart disease and other diseases of the circulatory system: Secondary | ICD-10-CM

## 2017-04-24 DIAGNOSIS — D72829 Elevated white blood cell count, unspecified: Secondary | ICD-10-CM | POA: Diagnosis present

## 2017-04-24 DIAGNOSIS — R32 Unspecified urinary incontinence: Secondary | ICD-10-CM | POA: Diagnosis present

## 2017-04-24 DIAGNOSIS — F0390 Unspecified dementia without behavioral disturbance: Secondary | ICD-10-CM | POA: Diagnosis present

## 2017-04-24 DIAGNOSIS — E860 Dehydration: Secondary | ICD-10-CM | POA: Diagnosis present

## 2017-04-24 DIAGNOSIS — E876 Hypokalemia: Secondary | ICD-10-CM | POA: Diagnosis present

## 2017-04-24 DIAGNOSIS — N448 Other noninflammatory disorders of the testis: Secondary | ICD-10-CM | POA: Diagnosis present

## 2017-04-24 DIAGNOSIS — R159 Full incontinence of feces: Secondary | ICD-10-CM | POA: Diagnosis present

## 2017-04-24 DIAGNOSIS — E871 Hypo-osmolality and hyponatremia: Secondary | ICD-10-CM | POA: Diagnosis present

## 2017-04-24 DIAGNOSIS — Z930 Tracheostomy status: Secondary | ICD-10-CM

## 2017-04-24 DIAGNOSIS — E1122 Type 2 diabetes mellitus with diabetic chronic kidney disease: Secondary | ICD-10-CM | POA: Diagnosis present

## 2017-04-24 DIAGNOSIS — N182 Chronic kidney disease, stage 2 (mild): Secondary | ICD-10-CM | POA: Diagnosis present

## 2017-04-24 DIAGNOSIS — E861 Hypovolemia: Secondary | ICD-10-CM | POA: Diagnosis present

## 2017-04-24 DIAGNOSIS — E785 Hyperlipidemia, unspecified: Secondary | ICD-10-CM | POA: Diagnosis present

## 2017-04-24 DIAGNOSIS — N179 Acute kidney failure, unspecified: Secondary | ICD-10-CM | POA: Diagnosis present

## 2017-04-24 DIAGNOSIS — G9341 Metabolic encephalopathy: Secondary | ICD-10-CM | POA: Diagnosis present

## 2017-04-24 DIAGNOSIS — F418 Other specified anxiety disorders: Secondary | ICD-10-CM

## 2017-04-24 DIAGNOSIS — E872 Acidosis: Secondary | ICD-10-CM | POA: Diagnosis present

## 2017-04-24 DIAGNOSIS — N390 Urinary tract infection, site not specified: Secondary | ICD-10-CM | POA: Diagnosis present

## 2017-04-24 DIAGNOSIS — Z87891 Personal history of nicotine dependence: Secondary | ICD-10-CM | POA: Diagnosis not present

## 2017-04-24 DIAGNOSIS — D649 Anemia, unspecified: Secondary | ICD-10-CM | POA: Diagnosis present

## 2017-04-24 DIAGNOSIS — I4510 Unspecified right bundle-branch block: Secondary | ICD-10-CM | POA: Diagnosis present

## 2017-04-24 DIAGNOSIS — E875 Hyperkalemia: Secondary | ICD-10-CM | POA: Diagnosis not present

## 2017-04-24 DIAGNOSIS — R627 Adult failure to thrive: Secondary | ICD-10-CM | POA: Diagnosis present

## 2017-04-24 DIAGNOSIS — I130 Hypertensive heart and chronic kidney disease with heart failure and stage 1 through stage 4 chronic kidney disease, or unspecified chronic kidney disease: Secondary | ICD-10-CM | POA: Diagnosis present

## 2017-04-24 DIAGNOSIS — I82501 Chronic embolism and thrombosis of unspecified deep veins of right lower extremity: Secondary | ICD-10-CM

## 2017-04-24 DIAGNOSIS — G4733 Obstructive sleep apnea (adult) (pediatric): Secondary | ICD-10-CM | POA: Diagnosis present

## 2017-04-24 DIAGNOSIS — F329 Major depressive disorder, single episode, unspecified: Secondary | ICD-10-CM | POA: Diagnosis present

## 2017-04-24 DIAGNOSIS — A419 Sepsis, unspecified organism: Principal | ICD-10-CM | POA: Diagnosis present

## 2017-04-24 DIAGNOSIS — N183 Chronic kidney disease, stage 3 (moderate): Secondary | ICD-10-CM | POA: Diagnosis present

## 2017-04-24 DIAGNOSIS — Z95828 Presence of other vascular implants and grafts: Secondary | ICD-10-CM

## 2017-04-24 DIAGNOSIS — L304 Erythema intertrigo: Secondary | ICD-10-CM | POA: Diagnosis present

## 2017-04-24 DIAGNOSIS — J449 Chronic obstructive pulmonary disease, unspecified: Secondary | ICD-10-CM | POA: Diagnosis present

## 2017-04-24 DIAGNOSIS — J9611 Chronic respiratory failure with hypoxia: Secondary | ICD-10-CM | POA: Diagnosis present

## 2017-04-24 DIAGNOSIS — R000 Tachycardia, unspecified: Secondary | ICD-10-CM | POA: Diagnosis present

## 2017-04-24 DIAGNOSIS — F419 Anxiety disorder, unspecified: Secondary | ICD-10-CM | POA: Diagnosis present

## 2017-04-24 DIAGNOSIS — Z6841 Body Mass Index (BMI) 40.0 and over, adult: Secondary | ICD-10-CM | POA: Diagnosis not present

## 2017-04-24 DIAGNOSIS — R4587 Impulsiveness: Secondary | ICD-10-CM | POA: Diagnosis present

## 2017-04-24 DIAGNOSIS — Z7901 Long term (current) use of anticoagulants: Secondary | ICD-10-CM

## 2017-04-24 DIAGNOSIS — E1129 Type 2 diabetes mellitus with other diabetic kidney complication: Secondary | ICD-10-CM | POA: Diagnosis present

## 2017-04-24 DIAGNOSIS — L8990 Pressure ulcer of unspecified site, unspecified stage: Secondary | ICD-10-CM | POA: Insufficient documentation

## 2017-04-24 DIAGNOSIS — J9612 Chronic respiratory failure with hypercapnia: Secondary | ICD-10-CM | POA: Diagnosis present

## 2017-04-24 HISTORY — DX: Type 2 diabetes mellitus without complications: E11.9

## 2017-04-24 LAB — CBC WITH DIFFERENTIAL/PLATELET
Basophils Absolute: 0 10*3/uL (ref 0.0–0.1)
Basophils Relative: 0 %
Eosinophils Absolute: 0.4 10*3/uL (ref 0.0–0.7)
Eosinophils Relative: 2 %
HCT: 33.8 % — ABNORMAL LOW (ref 39.0–52.0)
HEMOGLOBIN: 11.6 g/dL — AB (ref 13.0–17.0)
LYMPHS PCT: 7 %
Lymphs Abs: 1.2 10*3/uL (ref 0.7–4.0)
MCH: 27.2 pg (ref 26.0–34.0)
MCHC: 34.3 g/dL (ref 30.0–36.0)
MCV: 79.3 fL (ref 78.0–100.0)
MONO ABS: 1.1 10*3/uL — AB (ref 0.1–1.0)
MONOS PCT: 6 %
NEUTROS ABS: 15.5 10*3/uL — AB (ref 1.7–7.7)
NEUTROS PCT: 85 %
PLATELETS: 498 10*3/uL — AB (ref 150–400)
RBC: 4.26 MIL/uL (ref 4.22–5.81)
RDW: 17.8 % — AB (ref 11.5–15.5)
WBC: 18.2 10*3/uL — ABNORMAL HIGH (ref 4.0–10.5)

## 2017-04-24 LAB — COMPREHENSIVE METABOLIC PANEL
ALT: 23 U/L (ref 17–63)
AST: 40 U/L (ref 15–41)
Albumin: 3.1 g/dL — ABNORMAL LOW (ref 3.5–5.0)
Alkaline Phosphatase: 139 U/L — ABNORMAL HIGH (ref 38–126)
Anion gap: 21 — ABNORMAL HIGH (ref 5–15)
BUN: 110 mg/dL — ABNORMAL HIGH (ref 6–20)
CHLORIDE: 80 mmol/L — AB (ref 101–111)
CO2: 27 mmol/L (ref 22–32)
CREATININE: 3.43 mg/dL — AB (ref 0.61–1.24)
Calcium: 9.5 mg/dL (ref 8.9–10.3)
GFR calc non Af Amer: 18 mL/min — ABNORMAL LOW (ref >60)
GFR, EST AFRICAN AMERICAN: 20 mL/min — AB (ref >60)
Glucose, Bld: 151 mg/dL — ABNORMAL HIGH (ref 65–99)
Potassium: 2.7 mmol/L — CL (ref 3.5–5.1)
SODIUM: 128 mmol/L — AB (ref 135–145)
Total Bilirubin: 1.8 mg/dL — ABNORMAL HIGH (ref 0.3–1.2)
Total Protein: 8.4 g/dL — ABNORMAL HIGH (ref 6.5–8.1)

## 2017-04-24 LAB — I-STAT TROPONIN, ED: TROPONIN I, POC: 0.06 ng/mL (ref 0.00–0.08)

## 2017-04-24 LAB — BRAIN NATRIURETIC PEPTIDE: B Natriuretic Peptide: 48.6 pg/mL (ref 0.0–100.0)

## 2017-04-24 MED ORDER — SENNOSIDES-DOCUSATE SODIUM 8.6-50 MG PO TABS
1.0000 | ORAL_TABLET | Freq: Every evening | ORAL | Status: DC | PRN
  Filled 2017-04-24: qty 1

## 2017-04-24 MED ORDER — FOLIC ACID 1 MG PO TABS
1.0000 mg | ORAL_TABLET | Freq: Every day | ORAL | Status: DC
  Administered 2017-04-25: 1 mg via ORAL
  Filled 2017-04-24: qty 1

## 2017-04-24 MED ORDER — CLONAZEPAM 0.5 MG PO TABS
0.2500 mg | ORAL_TABLET | Freq: Every day | ORAL | Status: DC
  Administered 2017-04-25: 0.25 mg via ORAL
  Filled 2017-04-24: qty 1

## 2017-04-24 MED ORDER — MAGNESIUM SULFATE 2 GM/50ML IV SOLN
2.0000 g | Freq: Once | INTRAVENOUS | Status: AC
  Administered 2017-04-25: 2 g via INTRAVENOUS
  Filled 2017-04-24: qty 50

## 2017-04-24 MED ORDER — ISOSORBIDE MONONITRATE 20 MG PO TABS
20.0000 mg | ORAL_TABLET | Freq: Two times a day (BID) | ORAL | Status: DC
  Administered 2017-04-25: 20 mg via ORAL
  Filled 2017-04-24: qty 1

## 2017-04-24 MED ORDER — INSULIN ASPART 100 UNIT/ML ~~LOC~~ SOLN
0.0000 [IU] | Freq: Three times a day (TID) | SUBCUTANEOUS | Status: DC
  Administered 2017-04-25 – 2017-04-26 (×4): 1 [IU] via SUBCUTANEOUS
  Administered 2017-04-28: 2 [IU] via SUBCUTANEOUS
  Administered 2017-04-29: 1 [IU] via SUBCUTANEOUS
  Administered 2017-05-05: 2 [IU] via SUBCUTANEOUS
  Administered 2017-05-08: 1 [IU] via SUBCUTANEOUS
  Administered 2017-05-12: 2 [IU] via SUBCUTANEOUS
  Administered 2017-05-13 – 2017-05-19 (×6): 1 [IU] via SUBCUTANEOUS
  Administered 2017-05-21: 2 [IU] via SUBCUTANEOUS
  Administered 2017-05-22 – 2017-05-25 (×4): 1 [IU] via SUBCUTANEOUS

## 2017-04-24 MED ORDER — POTASSIUM CHLORIDE CRYS ER 20 MEQ PO TBCR
40.0000 meq | EXTENDED_RELEASE_TABLET | Freq: Once | ORAL | Status: AC
  Administered 2017-04-25: 40 meq via ORAL
  Filled 2017-04-24: qty 2

## 2017-04-24 MED ORDER — IPRATROPIUM-ALBUTEROL 0.5-2.5 (3) MG/3ML IN SOLN
3.0000 mL | Freq: Four times a day (QID) | RESPIRATORY_TRACT | Status: DC | PRN
  Administered 2017-04-26 – 2017-05-21 (×2): 3 mL via RESPIRATORY_TRACT
  Filled 2017-04-24 (×2): qty 3

## 2017-04-24 MED ORDER — OMEGA-3-ACID ETHYL ESTERS 1 G PO CAPS
1.0000 g | ORAL_CAPSULE | Freq: Every day | ORAL | Status: DC
  Administered 2017-04-25: 1 g via ORAL
  Filled 2017-04-24: qty 1

## 2017-04-24 MED ORDER — SODIUM CHLORIDE 0.9 % IV BOLUS (SEPSIS)
1000.0000 mL | Freq: Once | INTRAVENOUS | Status: AC
  Administered 2017-04-25: 1000 mL via INTRAVENOUS

## 2017-04-24 MED ORDER — ONDANSETRON HCL 4 MG/2ML IJ SOLN
4.0000 mg | Freq: Four times a day (QID) | INTRAMUSCULAR | Status: DC | PRN
  Administered 2017-05-03 – 2017-05-05 (×2): 4 mg via INTRAVENOUS
  Filled 2017-04-24 (×2): qty 2

## 2017-04-24 MED ORDER — SODIUM CHLORIDE 0.9% FLUSH
3.0000 mL | Freq: Two times a day (BID) | INTRAVENOUS | Status: DC
  Administered 2017-04-25 – 2017-05-18 (×32): 3 mL via INTRAVENOUS

## 2017-04-24 MED ORDER — DOCUSATE SODIUM 100 MG PO CAPS
100.0000 mg | ORAL_CAPSULE | Freq: Two times a day (BID) | ORAL | Status: DC
  Administered 2017-04-25 – 2017-05-22 (×37): 100 mg via ORAL
  Filled 2017-04-24 (×55): qty 1

## 2017-04-24 MED ORDER — FLUTICASONE PROPIONATE 50 MCG/ACT NA SUSP
2.0000 | Freq: Every day | NASAL | Status: DC
  Administered 2017-04-25 – 2017-05-24 (×30): 2 via NASAL
  Filled 2017-04-24: qty 16

## 2017-04-24 MED ORDER — POTASSIUM CHLORIDE IN NACL 20-0.9 MEQ/L-% IV SOLN
INTRAVENOUS | Status: DC
  Administered 2017-04-25: 03:00:00 via INTRAVENOUS
  Filled 2017-04-24: qty 1000

## 2017-04-24 MED ORDER — HYDROCODONE-ACETAMINOPHEN 5-325 MG PO TABS
1.0000 | ORAL_TABLET | ORAL | Status: DC | PRN
  Administered 2017-04-25 – 2017-05-03 (×7): 2 via ORAL
  Administered 2017-05-03 – 2017-05-05 (×3): 1 via ORAL
  Administered 2017-05-11 – 2017-05-15 (×9): 2 via ORAL
  Administered 2017-05-17 – 2017-05-23 (×2): 1 via ORAL
  Filled 2017-04-24 (×4): qty 2
  Filled 2017-04-24 (×3): qty 1
  Filled 2017-04-24: qty 2
  Filled 2017-04-24: qty 1
  Filled 2017-04-24 (×3): qty 2
  Filled 2017-04-24: qty 1
  Filled 2017-04-24 (×10): qty 2
  Filled 2017-04-24: qty 1

## 2017-04-24 MED ORDER — RIVAROXABAN 20 MG PO TABS: 20.0000 mg | ORAL_TABLET | Freq: Every day | ORAL | Status: DC

## 2017-04-24 MED ORDER — FAMOTIDINE 20 MG PO TABS
20.0000 mg | ORAL_TABLET | Freq: Every day | ORAL | Status: DC
  Administered 2017-04-25: 20 mg via ORAL
  Filled 2017-04-24: qty 1

## 2017-04-24 MED ORDER — BISACODYL 5 MG PO TBEC
5.0000 mg | DELAYED_RELEASE_TABLET | Freq: Every day | ORAL | Status: DC | PRN
  Filled 2017-04-24: qty 1

## 2017-04-24 MED ORDER — ONDANSETRON HCL 4 MG PO TABS
4.0000 mg | ORAL_TABLET | Freq: Four times a day (QID) | ORAL | Status: DC | PRN
  Administered 2017-05-06: 4 mg via ORAL
  Filled 2017-04-24: qty 1

## 2017-04-24 MED ORDER — HEPARIN SODIUM (PORCINE) 5000 UNIT/ML IJ SOLN: 5000.0000 [IU] | Freq: Three times a day (TID) | INTRAMUSCULAR | Status: DC

## 2017-04-24 MED ORDER — POTASSIUM CHLORIDE CRYS ER 20 MEQ PO TBCR
40.0000 meq | EXTENDED_RELEASE_TABLET | Freq: Three times a day (TID) | ORAL | Status: DC
  Administered 2017-04-25: 40 meq via ORAL
  Filled 2017-04-24: qty 2

## 2017-04-24 MED ORDER — DIPHENHYDRAMINE HCL 25 MG PO CAPS
25.0000 mg | ORAL_CAPSULE | Freq: Four times a day (QID) | ORAL | Status: DC | PRN
  Administered 2017-04-25 – 2017-05-23 (×24): 25 mg via ORAL
  Filled 2017-04-24 (×27): qty 1

## 2017-04-24 MED ORDER — INSULIN ASPART 100 UNIT/ML ~~LOC~~ SOLN: 0.0000 [IU] | Freq: Every day | SUBCUTANEOUS | Status: DC

## 2017-04-24 MED ORDER — ACETAMINOPHEN 325 MG PO TABS: 650.0000 mg | ORAL_TABLET | Freq: Four times a day (QID) | ORAL | Status: DC | PRN

## 2017-04-24 MED ORDER — DULOXETINE HCL 60 MG PO CPEP
60.0000 mg | ORAL_CAPSULE | Freq: Every day | ORAL | Status: DC
  Administered 2017-04-25: 60 mg via ORAL
  Filled 2017-04-24: qty 1

## 2017-04-24 MED ORDER — HYDRALAZINE HCL 25 MG PO TABS
37.5000 mg | ORAL_TABLET | Freq: Two times a day (BID) | ORAL | Status: DC
  Administered 2017-04-25 – 2017-05-25 (×56): 37.5 mg via ORAL
  Filled 2017-04-24 (×42): qty 2
  Filled 2017-04-24: qty 1.5
  Filled 2017-04-24 (×12): qty 2
  Filled 2017-04-24: qty 1.5
  Filled 2017-04-24 (×6): qty 2

## 2017-04-24 MED ORDER — ACETAMINOPHEN 650 MG RE SUPP: 650.0000 mg | Freq: Four times a day (QID) | RECTAL | Status: DC | PRN

## 2017-04-24 NOTE — ED Triage Notes (Addendum)
Per EMS, pt's roommate called [phone] due to increased weakness for the past month. Pt has been unable to stand for the past 2 weeks. Pt has bilateral lower leg edema. EMS states pt has been laying in his feces and urine for the past month.

## 2017-04-24 NOTE — ED Provider Notes (Signed)
[facility]-EMERGENCY DEPT Provider Note   CSN: [id number] Arrival date & time: 04/24/17  1912     History   Chief Complaint Chief Complaint  Patient presents with  . Weakness    HPI [name] is a 66 y.o. male.  HPI Patient presents to the emergency room for evaluation of weakness. Patient states for the last 2 weeks he has had weakness in his lower extremities has not been able to stand. Patient admits however that he has had progressive difficulty walking and weakness for even a longer period of time. Patient states the last time he has been able to walk outside the house and sit on the porch was probably several months ago. For the last 2 weeks the patient has been lying on a couch at a friend's house. He has not been able to stand and go to the bathroom. He has been urinating and defecating on himself. Patient denies any fevers. Denies any abdominal pain. He denies any chest pain or shortness of breath Past Medical History:  Diagnosis Date  . Arthritis   . CHF (congestive heart failure) (HCC)   . COPD (chronic obstructive pulmonary disease) (HCC)   . Hyperlipidemia   . Hypertension   . Morbid obesity (HCC)   . Multiple abrasions    rt arm  . RCT (rotator cuff tear)     Patient Active Problem List   Diagnosis Date Noted  . Tracheostomy dependence (HCC)   . Venous ulcer of right leg (HCC) 08/10/2016  . Acquired tracheomalacia   . Granulation tissue   . OSA (obstructive sleep apnea)   . Dyslipidemia associated with type 2 diabetes mellitus (HCC) 04/20/2016  . DM (diabetes mellitus) type II controlled with renal manifestation (HCC) 04/20/2016  . Tremor of both hands 02/29/2016  . GERD without esophagitis 02/29/2016  . Chronic renal failure, stage 2 (mild) 02/17/2016  . Presence of IVC filter 01/06/2016  . Hypertensive heart disease with CHF (congestive heart failure) (HCC) 12/27/2015  . Depression with anxiety 09/02/2015  . Chronic diastolic congestive heart failure  (HCC) 04/22/2015  . Hypokalemia 04/22/2015  . Chronic respiratory failure with hypoxia (HCC)   . Tracheostomy status (HCC)   . Obesity hypoventilation syndrome (HCC) 01/20/2015  . Chronic deep vein thrombosis (DVT) (HCC)   . BMI 50.0-59.9, adult (HCC) 11/29/2014  . Tobacco abuse 11/29/2014    Past Surgical History:  Procedure Laterality Date  . 40981    . NO PAST SURGERIES    . SHOULDER ARTHROSCOPY WITH SUBACROMIAL DECOMPRESSION, ROTATOR CUFF REPAIR AND BICEP TENDON REPAIR Right 12/28/2014   Procedure: RIGHT SHOULDER ARTHROSCOPY WITH SUBACROMIAL DECOMPRESSION, DISTAL CLAVICLE RESECTION, ROTATOR CUFF REPAIR ;  Surgeon: Eugenia Mcalpine, MD;  Location: [facility] ORS;  Service: Orthopedics;  Laterality: Right;  . TRACHEOSTOMY REVISION N/A 01/18/2015   Procedure: TRACHEOSTOMY REVISION;  Surgeon: Melvenia Beam, MD;  Location: [facility] OR;  Service: ENT;  Laterality: N/A;  . TRACHEOSTOMY TUBE PLACEMENT N/A 01/16/2015   Procedure: TRACHEOSTOMY;  Surgeon: Flo Shanks, MD;  Location: [facility] OR;  Service: ENT;  Laterality: N/A;  . TRACHEOSTOMY TUBE PLACEMENT N/A 01/22/2015   Procedure: TRACHEOSTOMY;  Surgeon: Flo Shanks, MD;  Location: [facility] OR;  Service: ENT;  Laterality: N/A;       Home Medications    Prior to Admission medications   Medication Sig Start Date End Date Taking? Authorizing Provider  atorvastatin (LIPITOR) 20 MG tablet Take 20 mg by mouth daily.    [provider]  clonazePAM (KLONOPIN) 0.5 MG tablet  Take 0.25 mg by mouth at bedtime.     [provider]  diphenhydrAMINE (BENADRYL) 25 MG tablet Take 25 mg by mouth every 6 (six) hours as needed for itching.    [provider]  docusate sodium (COLACE) 100 MG capsule Take 1 capsule (100 mg total) by mouth 2 (two) times daily. 06/26/15   Dorothea Ogle, MD  DULoxetine (CYMBALTA) 60 MG capsule Take 60 mg by mouth daily.    [provider]  famotidine (PEPCID) 20 MG tablet Take 20 mg by mouth daily.    [provider]  folic acid (FOLVITE) 1 MG tablet Take 1 mg by mouth daily.    [provider]  hydrALAZINE (APRESOLINE) 25 MG tablet Give 1.5 mg by mouth two times daily    [provider]  ipratropium-albuterol (DUONEB) 0.5-2.5 (3) MG/3ML SOLN Take 3 mLs by nebulization every 6 (six) hours as needed. For shortness of breath    [provider]  isosorbide mononitrate (ISMO,MONOKET) 20 MG tablet Take 20 mg by mouth 2 (two) times daily at 10 AM and 5 PM.    [provider]  lidocaine (LIDODERM) 5 % Place 1 patch onto the skin daily. Remove & Discard patch within 12 hours or as directed by MD To right shoulder    [provider]  mometasone (NASONEX) 50 MCG/ACT nasal spray 2 sprays daily.    [provider]  Omega-3 Fatty Acids (FISH OIL) 1000 MG CAPS Take 1,000 mg by mouth daily.     [provider]  ondansetron (ZOFRAN) 4 MG tablet Take 1 tablet (4 mg total) by mouth every 6 (six) hours as needed for nausea or vomiting. 03/16/16   Sharee Holster, NP  Potassium (POTASSIMIN PO) Take 40 mEq by mouth 4 (four) times daily.     [provider]  rivaroxaban (XARELTO) 20 MG TABS tablet Take 20 mg by mouth daily with supper.    [provider]  torsemide (DEMADEX) 10 MG tablet Take 10 mg by mouth daily.    [provider]  triamterene (DYRENIUM) 50 MG capsule Take 1 capsule (50 mg total) by mouth daily. 02/17/16   Sharee Holster, NP    Family History Family History  Problem Relation Age of Onset  . Heart disease Mother   . Heart attack Mother   . Heart disease Brother     Social History Social History  Substance Use Topics  . Smoking status: Former Smoker    Packs/day: 0.20    Types: Cigarettes  . Smokeless tobacco: Former Neurosurgeon  . Alcohol use No     Allergies   Patient has no known allergies.   Review of Systems Review of Systems   Physical Exam Updated Vital Signs BP (!) 117/53 (BP  Location: Left Arm)   Pulse 88   Temp 97.7 F (36.5 C) (Oral)   Resp 18   SpO2 99%   Physical Exam  Constitutional: No distress.  Morbidly obese, the patient is covered in stool covering his buttocks perineum and down his legs  HENT:  Head: Normocephalic and atraumatic.  Right Ear: External ear normal.  Left Ear: External ear normal.  Eyes: Conjunctivae are normal. Right eye exhibits no discharge. Left eye exhibits no discharge. No scleral icterus.  Neck: Neck supple. No tracheal deviation present.  Tracheostomy in place  Cardiovascular: Normal rate, regular rhythm and intact distal pulses.   Pulmonary/Chest: Effort normal and breath sounds normal. No stridor. No  respiratory distress. He has no wheezes. He has no rales.  Abdominal: Soft. Bowel sounds are normal. He exhibits no distension. There is no tenderness. There is no rebound and no guarding.  Protuberant abdomen  Genitourinary:  Genitourinary Comments: Breakdown in the tissue around his perineum and sacrum, superficial ulcerations with small amount of active bleeding large amount of stool covering the buttocks and scrotum  Musculoskeletal: He exhibits edema (chronic lymphedema changes in his lower extremities). He exhibits no tenderness.  Neurological: He is alert. No cranial nerve deficit (no facial droop, extraocular movements intact, no slurred speech) or sensory deficit. He exhibits normal muscle tone. He displays no seizure activity.  Patient has difficulty rolling over in bed, he has difficulty moving his legs but can plantar flex bilaterally although with assistance he can roll to his side, good grip strength bilaterally  Skin: Skin is warm and dry. No rash noted.  Psychiatric: He has a normal mood and affect.  Nursing note and vitals reviewed.    ED Treatments / Results  Labs (all labs ordered are listed, but only abnormal results are displayed) Labs Reviewed  CBC WITH DIFFERENTIAL/PLATELET - Abnormal; Notable for  the following:       Result Value   WBC 18.2 (*)    Hemoglobin 11.6 (*)    HCT 33.8 (*)    RDW 17.8 (*)    Platelets 498 (*)    Neutro Abs 15.5 (*)    Monocytes Absolute 1.1 (*)    All other components within normal limits  COMPREHENSIVE METABOLIC PANEL - Abnormal; Notable for the following:    Sodium 128 (*)    Potassium 2.7 (*)    Chloride 80 (*)    Glucose, Bld 151 (*)    BUN 110 (*)    Creatinine, Ser 3.43 (*)    Total Protein 8.4 (*)    Albumin 3.1 (*)    Alkaline Phosphatase 139 (*)    Total Bilirubin 1.8 (*)    GFR calc non Af Amer 18 (*)    GFR calc Af Amer 20 (*)    Anion gap 21 (*)    All other components within normal limits  BRAIN NATRIURETIC PEPTIDE  URINALYSIS, ROUTINE W REFLEX MICROSCOPIC  I-STAT TROPONIN, ED    EKG  EKG Interpretation  Date/Time:  Saturday April 24 2017 20:22:09 EDT Ventricular Rate:  77 PR Interval:    QRS Duration: 146 QT Interval:  439 QTC Calculation: 497 R Axis:   89 Text Interpretation:  Sinus rhythm Atrial premature complexes Right bundle branch block No significant change since last tracing Confirmed by Linwood Dibbles [phone]) on 04/24/2017 8:26:00 PM       Radiology Dg Chest 2 View  Result Date: 04/24/2017 CLINICAL DATA:  Increasing weakness EXAM: CHEST  2 VIEW COMPARISON:  03/14/2016 FINDINGS: Tracheostomy tube is again identified. Cardiac shadow is stable. The lungs are well aerated bilaterally. No focal infiltrate or sizable effusion is seen. Degenerative changes of the thoracic spine are noted. IMPRESSION: No acute abnormality seen. Electronically Signed   By: Alcide Clever M.D.   On: 04/24/2017 20:57   Dg Lumbar Spine Complete  Result Date: 04/24/2017 CLINICAL DATA:  Increasing weakness with inability to stand EXAM: LUMBAR SPINE - COMPLETE 4+ VIEW COMPARISON:  06/25/2015 FINDINGS: Five lumbar type vertebral bodies are well visualized. Vertebral body height is well maintained. Multilevel osteophytic changes are seen. Disc  space narrowing is noted at multiple levels worst at L2-3 and L3-4. Aortic calcifications are noted.  An IVC filter is noted in satisfactory position. No overlying soft tissue abnormality is seen. IMPRESSION: Multilevel degenerative change without acute abnormality. Electronically Signed   By: Alcide Clever M.D.   On: 04/24/2017 20:58   Ct Head Wo Contrast  Result Date: 04/24/2017 CLINICAL DATA:  Increased weakness unable to stand EXAM: CT HEAD WITHOUT CONTRAST TECHNIQUE: Contiguous axial images were obtained from the base of the skull through the vertex without intravenous contrast. COMPARISON:  01/28/2015 FINDINGS: Brain: No acute territorial infarction, hemorrhage, or intracranial mass is seen. Mild small vessel ischemic changes of the white matter. Stable ventricle size. Mild atrophy. Vascular: No hyperdense vessels.  Carotid artery calcifications. Skull: No fracture or suspicious bone lesion. Sinuses/Orbits: No acute finding. Other: None IMPRESSION: No CT evidence for acute intracranial abnormality. Small vessel ischemic changes of the white matter and mild atrophy Electronically Signed   By: Jasmine Pang M.D.   On: 04/24/2017 21:02    Procedures Procedures (including critical care time)  Medications Ordered in ED Medications  potassium chloride SA (K-DUR,KLOR-CON) CR tablet 40 mEq (not administered)  sodium chloride 0.9 % bolus 1,000 mL (not administered)     Initial Impression / Assessment and Plan / ED Course  I have reviewed the triage vital signs and the nursing notes.  Pertinent labs & imaging results that were available during my care of the patient were reviewed by me and considered in my medical decision making (see chart for details).   Patient presented to the emergency room for generalized weakness. Patient is morbidly obese and this may be a contributing factor. The patient has been lying in bed, defecating and urinating on himself. Vital signs are stable. Laboratory tests are  notable for acute renal failure.  Patient also has a leukocytosis.  Chest x-ray does not show pneumonia. He has a lot of skin breakdown but no definitive signs of cellulitis on my exam. I'm concerned about the possibility of a urinary tract infection. I ordered a urinalysis.  Patient refused urinary catheterization earlier.  Will ask him again.  Bladder scan and in and out cath ordered. Will plan on admission to the hospital, IV hydration, close monitoring of his renal failure.    Final Clinical Impressions(s) / ED Diagnoses   Final diagnoses:  Acute renal failure, unspecified acute renal failure type (HCC)  Leukocytosis, unspecified type      Linwood Dibbles, MD 04/24/17 2316

## 2017-04-24 NOTE — H&P (Signed)
History and Physical    [name] ONG:[id number] DOB: June 03, 1951 DOA: 04/24/2017  PCP: Kirt Boys, DO   Patient coming from: Home  Chief Complaint: Gen weakness  HPI: [name] is a 66 y.o. male with medical history significant for COPD, chronic diastolic CHF, hypertension, morbid obesity, chronic kidney disease stage II, and OSA, into the emergency department for evaluation of generalized weakness. Patient reports generalized weakness and worsening insidiously over the past few months, and particularly over the past 2 weeks. Over the past 2 weeks, he has been lying on a couch at a friend's home, unable to get up due to profound generalized weakness, reportedly urinating and defecating on himself over this interval. Eventually, EMS was called out tonight for transport to the hospital. Denies headache, change in vision or hearing, or focal numbness or weakness. No recent fevers or chills.denies recent fall or trauma.  ED Course: Upon arrival to the ED, patient is found to be afebrile, saturating well on room air, with vitals otherwise stable. EKG features a sinus rhythm with PACs and right bundle branch block. Chest x-ray is negative for acute cardiac pulmonary disease and no acute findings are appreciated on the lumbar spine radiography. Noncontrast head CT is negative for acute intracranial abnormality. Chemistry panel features sodium 128, potassium 2.7, BUN 110, and creatinine of 3.43, up from 1.2 last August. CBC features a leukocytosis to 18,200, chronic thrombocytosis with platelets 498,000, and a new normocytic anemia with hemoglobin of 11.6. Troponin and BNP are within normal limits. Urinalysis was ordered and remains pending. Patient was treated with a liter of normal saline and 40 mEq oral potassium in the ED. He remained hemodynamically stable and has not been in any apparent respiratory distress. He'll be admitted to telemetry unit for ongoing evaluation and management of  generalized weakness and failure to thrive with acute kidney injury and marked electrolyte derangements.  Review of Systems:  All other systems reviewed and apart from HPI, are negative.  Past Medical History:  Diagnosis Date  . Arthritis   . CHF (congestive heart failure) (HCC)   . COPD (chronic obstructive pulmonary disease) (HCC)   . Hyperlipidemia   . Hypertension   . Morbid obesity (HCC)   . Multiple abrasions    rt arm  . RCT (rotator cuff tear)     Past Surgical History:  Procedure Laterality Date  . 44010    . NO PAST SURGERIES    . SHOULDER ARTHROSCOPY WITH SUBACROMIAL DECOMPRESSION, ROTATOR CUFF REPAIR AND BICEP TENDON REPAIR Right 12/28/2014   Procedure: RIGHT SHOULDER ARTHROSCOPY WITH SUBACROMIAL DECOMPRESSION, DISTAL CLAVICLE RESECTION, ROTATOR CUFF REPAIR ;  Surgeon: Eugenia Mcalpine, MD;  Location: [facility] ORS;  Service: Orthopedics;  Laterality: Right;  . TRACHEOSTOMY REVISION N/A 01/18/2015   Procedure: TRACHEOSTOMY REVISION;  Surgeon: Melvenia Beam, MD;  Location: [facility] OR;  Service: ENT;  Laterality: N/A;  . TRACHEOSTOMY TUBE PLACEMENT N/A 01/16/2015   Procedure: TRACHEOSTOMY;  Surgeon: Flo Shanks, MD;  Location: [facility] OR;  Service: ENT;  Laterality: N/A;  . TRACHEOSTOMY TUBE PLACEMENT N/A 01/22/2015   Procedure: TRACHEOSTOMY;  Surgeon: Flo Shanks, MD;  Location: [facility] OR;  Service: ENT;  Laterality: N/A;     reports that he has quit smoking. His smoking use included Cigarettes. He smoked 0.20 packs per day. He has quit using smokeless tobacco. He reports that he does not drink alcohol or use drugs.  No Known Allergies  Family History  Problem Relation Age of Onset  . Heart disease Mother   .  Heart attack Mother   . Heart disease Brother      Prior to Admission medications   Medication Sig Start Date End Date Taking? Authorizing Provider  atorvastatin (LIPITOR) 20 MG tablet Take 20 mg by mouth daily.    [provider]  clonazePAM (KLONOPIN) 0.5 MG tablet  Take 0.25 mg by mouth at bedtime.     [provider]  diphenhydrAMINE (BENADRYL) 25 MG tablet Take 25 mg by mouth every 6 (six) hours as needed for itching.    [provider]  docusate sodium (COLACE) 100 MG capsule Take 1 capsule (100 mg total) by mouth 2 (two) times daily. 06/26/15   Dorothea Ogle, MD  DULoxetine (CYMBALTA) 60 MG capsule Take 60 mg by mouth daily.    [provider]  famotidine (PEPCID) 20 MG tablet Take 20 mg by mouth daily.    [provider]  folic acid (FOLVITE) 1 MG tablet Take 1 mg by mouth daily.    [provider]  hydrALAZINE (APRESOLINE) 25 MG tablet Give 1.5 mg by mouth two times daily    [provider]  ipratropium-albuterol (DUONEB) 0.5-2.5 (3) MG/3ML SOLN Take 3 mLs by nebulization every 6 (six) hours as needed. For shortness of breath    [provider]  isosorbide mononitrate (ISMO,MONOKET) 20 MG tablet Take 20 mg by mouth 2 (two) times daily at 10 AM and 5 PM.    [provider]  lidocaine (LIDODERM) 5 % Place 1 patch onto the skin daily. Remove & Discard patch within 12 hours or as directed by MD To right shoulder    [provider]  mometasone (NASONEX) 50 MCG/ACT nasal spray 2 sprays daily.    [provider]  Omega-3 Fatty Acids (FISH OIL) 1000 MG CAPS Take 1,000 mg by mouth daily.     [provider]  ondansetron (ZOFRAN) 4 MG tablet Take 1 tablet (4 mg total) by mouth every 6 (six) hours as needed for nausea or vomiting. 03/16/16   Sharee Holster, NP  Potassium (POTASSIMIN PO) Take 40 mEq by mouth 4 (four) times daily.     [provider]  rivaroxaban (XARELTO) 20 MG TABS tablet Take 20 mg by mouth daily with supper.    [provider]  torsemide (DEMADEX) 10 MG tablet Take 10 mg by mouth daily.    [provider]  triamterene (DYRENIUM) 50 MG capsule Take 1 capsule (50 mg total) by mouth daily. 02/17/16   Sharee Holster, NP     Physical Exam: Vitals:   04/24/17 2016  BP: (!) 117/53  Pulse: 88  Resp: 18  Temp: 97.7 F (36.5 C)  TempSrc: Oral  SpO2: 99%      Constitutional: NAD, calm, obese, disheveled.  Eyes: PERTLA, lids and conjunctivae normal ENMT: Mucous membranes are dry. Posterior pharynx clear of any exudate or lesions.   Neck: normal, supple, no masses, no thyromegaly Respiratory: Diminished bilaterally, no wheezing, no crackles. Normal respiratory effort.   Cardiovascular: S1 & S2 heard, regular rate and rhythm.  No significant JVD.  Abdomen: No distension, no tenderness, no masses palpated. Bowel sounds normal.  Musculoskeletal: no clubbing / cyanosis. No joint deformity upper and lower extremities.    Skin: no significant rashes, lesions, ulcers. Warm, dry, well-perfused. Poor turgor, skin breakdown.  Neurologic: No gross facial asymmetry. Moving all extremities. Sensation intact, DTR normal. Fine resting tremor to RUE.  Psychiatric: Alert and oriented x 3. Calm and cooperative.  Labs on Admission: I have personally reviewed following labs and imaging studies  CBC:  Recent Labs Lab 04/24/17 2059  WBC 18.2*  NEUTROABS 15.5*  HGB 11.6*  HCT 33.8*  MCV 79.3  PLT 498*   Basic Metabolic Panel:  Recent Labs Lab 04/24/17 2059  NA 128*  K 2.7*  CL 80*  CO2 27  GLUCOSE 151*  BUN 110*  CREATININE 3.43*  CALCIUM 9.5   GFR: CrCl cannot be calculated (Unknown ideal weight.). Liver Function Tests:  Recent Labs Lab 04/24/17 2059  AST 40  ALT 23  ALKPHOS 139*  BILITOT 1.8*  PROT 8.4*  ALBUMIN 3.1*   No results for input(s): LIPASE, AMYLASE in the last 168 hours. No results for input(s): AMMONIA in the last 168 hours. Coagulation Profile: No results for input(s): INR, PROTIME in the last 168 hours. Cardiac Enzymes: No results for input(s): CKTOTAL, CKMB, CKMBINDEX, TROPONINI in the last 168 hours. BNP (last 3 results) No results for input(s): PROBNP in the last  8760 hours. HbA1C: No results for input(s): HGBA1C in the last 72 hours. CBG: No results for input(s): GLUCAP in the last 168 hours. Lipid Profile: No results for input(s): CHOL, HDL, LDLCALC, TRIG, CHOLHDL, LDLDIRECT in the last 72 hours. Thyroid Function Tests: No results for input(s): TSH, T4TOTAL, FREET4, T3FREE, THYROIDAB in the last 72 hours. Anemia Panel: No results for input(s): VITAMINB12, FOLATE, FERRITIN, TIBC, IRON, RETICCTPCT in the last 72 hours. Urine analysis:    Component Value Date/Time   COLORURINE AMBER (A) 06/17/2015 1407   APPEARANCEUR TURBID (A) 06/17/2015 1407   LABSPEC 1.021 06/17/2015 1407   PHURINE 5.5 06/17/2015 1407   GLUCOSEU NEGATIVE 06/17/2015 1407   HGBUR LARGE (A) 06/17/2015 1407   BILIRUBINUR NEGATIVE 06/17/2015 1407   KETONESUR NEGATIVE 06/17/2015 1407   PROTEINUR 30 (A) 06/17/2015 1407   UROBILINOGEN 1.0 06/17/2015 1407   NITRITE POSITIVE (A) 06/17/2015 1407   LEUKOCYTESUR MODERATE (A) 06/17/2015 1407   Sepsis Labs: @LABRCNTIP (procalcitonin:4,lacticidven:4) )No results found for this or any previous visit (from the past 240 hour(s)).   Radiological Exams on Admission: Dg Chest 2 View  Result Date: 04/24/2017 CLINICAL DATA:  Increasing weakness EXAM: CHEST  2 VIEW COMPARISON:  03/14/2016 FINDINGS: Tracheostomy tube is again identified. Cardiac shadow is stable. The lungs are well aerated bilaterally. No focal infiltrate or sizable effusion is seen. Degenerative changes of the thoracic spine are noted. IMPRESSION: No acute abnormality seen. Electronically Signed   By: Alcide Clever M.D.   On: 04/24/2017 20:57   Dg Lumbar Spine Complete  Result Date: 04/24/2017 CLINICAL DATA:  Increasing weakness with inability to stand EXAM: LUMBAR SPINE - COMPLETE 4+ VIEW COMPARISON:  06/25/2015 FINDINGS: Five lumbar type vertebral bodies are well visualized. Vertebral body height is well maintained. Multilevel osteophytic changes are seen. Disc space narrowing  is noted at multiple levels worst at L2-3 and L3-4. Aortic calcifications are noted. An IVC filter is noted in satisfactory position. No overlying soft tissue abnormality is seen. IMPRESSION: Multilevel degenerative change without acute abnormality. Electronically Signed   By: Alcide Clever M.D.   On: 04/24/2017 20:58   Ct Head Wo Contrast  Result Date: 04/24/2017 CLINICAL DATA:  Increased weakness unable to stand EXAM: CT HEAD WITHOUT CONTRAST TECHNIQUE: Contiguous axial images were obtained from the base of the skull through the vertex without intravenous contrast. COMPARISON:  01/28/2015 FINDINGS: Brain: No acute territorial infarction, hemorrhage, or intracranial mass is seen. Mild small vessel ischemic changes of the white matter.  Stable ventricle size. Mild atrophy. Vascular: No hyperdense vessels.  Carotid artery calcifications. Skull: No fracture or suspicious bone lesion. Sinuses/Orbits: No acute finding. Other: None IMPRESSION: No CT evidence for acute intracranial abnormality. Small vessel ischemic changes of the white matter and mild atrophy Electronically Signed   By: Jasmine Pang M.D.   On: 04/24/2017 21:02    EKG: Independently reviewed. Sinus rhythm, PAC's, RBBB.   Assessment/Plan  1. Acute kidney injury superimposed on CKD stage II  - SCr is 3.43 on admission, up from 1.2 one year earlier with no more recent values available  - Pt is clinically hypovolemic and was given 1 liter NS in ED  - Plan to eval further with renal US and urine studies, continue IVF hydration, hold diuretics, and repeat chem panel in am   2. Hypokalemia  - Serum potassium is 2.7 on admission  - Treated in ED with 40 mEq oral potassium  - Continue cardiac monitoring, add KCl to IVF, continue oral supplement, give 1g IV magnesium, repeat chem panel in am    3. Hyponatremia  - Serum sodium is 128, likely secondary to hypovolemia  - Given 1 liter NS in ED and continued on IVF hydration with NS  - Repeat  chem panel in am   4. Leukocytosis  - WBC elevated to 18,200 on admission without fever  - No evidence for PNA; abd exam benign, skin breakdown without obvious secondary infection  - UA remains pending, will follow-up  - Culture if febrile    5. Chronic DVT  - No evidence for acute VTE - Continue Xarelto    6. Chronic diastolic CHF  - Hypovolemic on admission  - Hold diuretics, continue IVF hydration, follow daily wts and I/O's    7. Depression, anxiety  - Suspect this may be contributing to his presentation  - Social work consultation requested  - Continue Cymbalta, Klonopin   8. Normocytic anemia  - Hgb is 11.6 on admission with no bleeding evident  - Previously in 13-14 range - Likely secondary to chronic disease, will check anemia panel    DVT prophylaxis: Xarelto  Code Status: Full  Family Communication: Discussed with patient Disposition Plan: Admit to telemetry Consults called: None Admission status: Inpatient    Briscoe Deutscher, MD [facility] Pager [phone]  If 7PM-7AM, please contact night-coverage [email or web address] Password [facility]  04/24/2017, 11:59 PM

## 2017-04-24 NOTE — ED Notes (Signed)
Bed: WA07 Expected date:  Expected time:  Means of arrival:  Comments: 66 yo weakness x1 month

## 2017-04-24 NOTE — ED Notes (Signed)
Unable to collect labs patient is in xray 

## 2017-04-25 ENCOUNTER — Inpatient Hospital Stay (HOSPITAL_COMMUNITY): Payer: Medicaid Other

## 2017-04-25 LAB — URINALYSIS, ROUTINE W REFLEX MICROSCOPIC
BILIRUBIN URINE: NEGATIVE
GLUCOSE, UA: NEGATIVE mg/dL
KETONES UR: NEGATIVE mg/dL
NITRITE: POSITIVE — AB
PH: 8 (ref 5.0–8.0)
Protein, ur: 100 mg/dL — AB
Specific Gravity, Urine: 1.009 (ref 1.005–1.030)
Squamous Epithelial / LPF: NONE SEEN

## 2017-04-25 LAB — CBC WITH DIFFERENTIAL/PLATELET
Basophils Absolute: 0 10*3/uL (ref 0.0–0.1)
Basophils Relative: 0 %
EOS ABS: 0.6 10*3/uL (ref 0.0–0.7)
EOS PCT: 3 %
HCT: 33.5 % — ABNORMAL LOW (ref 39.0–52.0)
Hemoglobin: 11.3 g/dL — ABNORMAL LOW (ref 13.0–17.0)
LYMPHS ABS: 1.3 10*3/uL (ref 0.7–4.0)
Lymphocytes Relative: 7 %
MCH: 26.8 pg (ref 26.0–34.0)
MCHC: 33.7 g/dL (ref 30.0–36.0)
MCV: 79.4 fL (ref 78.0–100.0)
MONOS PCT: 7 %
Monocytes Absolute: 1.3 10*3/uL — ABNORMAL HIGH (ref 0.1–1.0)
Neutro Abs: 15.6 10*3/uL — ABNORMAL HIGH (ref 1.7–7.7)
Neutrophils Relative %: 83 %
PLATELETS: 475 10*3/uL — AB (ref 150–400)
RBC: 4.22 MIL/uL (ref 4.22–5.81)
RDW: 17.9 % — ABNORMAL HIGH (ref 11.5–15.5)
WBC: 18.9 10*3/uL — ABNORMAL HIGH (ref 4.0–10.5)

## 2017-04-25 LAB — MAGNESIUM: MAGNESIUM: 3.3 mg/dL — AB (ref 1.7–2.4)

## 2017-04-25 LAB — BASIC METABOLIC PANEL
Anion gap: 18 — ABNORMAL HIGH (ref 5–15)
BUN: 106 mg/dL — AB (ref 6–20)
CALCIUM: 9.2 mg/dL (ref 8.9–10.3)
CO2: 26 mmol/L (ref 22–32)
CREATININE: 3.23 mg/dL — AB (ref 0.61–1.24)
Chloride: 87 mmol/L — ABNORMAL LOW (ref 101–111)
GFR calc Af Amer: 22 mL/min — ABNORMAL LOW (ref >60)
GFR, EST NON AFRICAN AMERICAN: 19 mL/min — AB (ref >60)
GLUCOSE: 136 mg/dL — AB (ref 65–99)
Potassium: 2.7 mmol/L — CL (ref 3.5–5.1)
Sodium: 131 mmol/L — ABNORMAL LOW (ref 135–145)

## 2017-04-25 LAB — GLUCOSE, CAPILLARY
Glucose-Capillary: 123 mg/dL — ABNORMAL HIGH (ref 65–99)
Glucose-Capillary: 131 mg/dL — ABNORMAL HIGH (ref 65–99)
Glucose-Capillary: 135 mg/dL — ABNORMAL HIGH (ref 65–99)
Glucose-Capillary: 124 mg/dL — ABNORMAL HIGH (ref 65–99)

## 2017-04-25 LAB — VITAMIN B12: VITAMIN B 12: 313 pg/mL (ref 180–914)

## 2017-04-25 LAB — IRON AND TIBC
IRON: 26 ug/dL — AB (ref 45–182)
SATURATION RATIOS: 12 % — AB (ref 17.9–39.5)
TIBC: 223 ug/dL — ABNORMAL LOW (ref 250–450)
UIBC: 197 ug/dL

## 2017-04-25 LAB — RETICULOCYTES
RBC.: 4.22 MIL/uL (ref 4.22–5.81)
Retic Count, Absolute: 118.2 10*3/uL (ref 19.0–186.0)
Retic Ct Pct: 2.8 % (ref 0.4–3.1)

## 2017-04-25 LAB — HIV ANTIBODY (ROUTINE TESTING W REFLEX): HIV Screen 4th Generation wRfx: NONREACTIVE

## 2017-04-25 LAB — CREATININE, URINE, RANDOM: CREATININE, URINE: 58.16 mg/dL

## 2017-04-25 LAB — TSH: TSH: 1.691 u[IU]/mL (ref 0.350–4.500)

## 2017-04-25 LAB — LACTIC ACID, PLASMA: Lactic Acid, Venous: 2.6 mmol/L (ref 0.5–1.9)

## 2017-04-25 LAB — FERRITIN: Ferritin: 913 ng/mL — ABNORMAL HIGH (ref 24–336)

## 2017-04-25 LAB — FOLATE: FOLATE: 8.6 ng/mL (ref >5.9)

## 2017-04-25 LAB — HEMOGLOBIN A1C
HEMOGLOBIN A1C: 7.5 % — AB (ref 4.8–5.6)
Mean Plasma Glucose: 168.55 mg/dL

## 2017-04-25 LAB — SODIUM, URINE, RANDOM: Sodium, Ur: 58 mmol/L

## 2017-04-25 MED ORDER — SODIUM CHLORIDE 0.9 % IV SOLN
INTRAVENOUS | Status: DC
  Administered 2017-04-25 – 2017-04-29 (×8): via INTRAVENOUS

## 2017-04-25 MED ORDER — CEFTRIAXONE SODIUM 1 G IJ SOLR
1.0000 g | Freq: Every day | INTRAMUSCULAR | Status: DC
  Administered 2017-04-25 – 2017-04-30 (×7): 1 g via INTRAVENOUS
  Filled 2017-04-25 (×7): qty 10

## 2017-04-25 MED ORDER — POTASSIUM CHLORIDE 10 MEQ/100ML IV SOLN
10.0000 meq | INTRAVENOUS | Status: AC
  Administered 2017-04-25 (×3): 10 meq via INTRAVENOUS
  Filled 2017-04-25 (×3): qty 100

## 2017-04-25 MED ORDER — GUAIFENESIN-DM 100-10 MG/5ML PO SYRP
5.0000 mL | ORAL_SOLUTION | ORAL | Status: DC | PRN
  Administered 2017-04-25 – 2017-04-28 (×7): 5 mL via ORAL
  Filled 2017-04-25 (×7): qty 10

## 2017-04-25 MED ORDER — CHLORHEXIDINE GLUCONATE 0.12 % MT SOLN
15.0000 mL | Freq: Two times a day (BID) | OROMUCOSAL | Status: DC
  Administered 2017-04-25 – 2017-05-25 (×51): 15 mL via OROMUCOSAL
  Filled 2017-04-25 (×57): qty 15

## 2017-04-25 MED ORDER — HEPARIN SODIUM (PORCINE) 5000 UNIT/ML IJ SOLN
5000.0000 [IU] | Freq: Three times a day (TID) | INTRAMUSCULAR | Status: DC
  Administered 2017-04-25 – 2017-05-18 (×67): 5000 [IU] via SUBCUTANEOUS
  Filled 2017-04-25 (×64): qty 1

## 2017-04-25 MED ORDER — HYDRALAZINE HCL 20 MG/ML IJ SOLN
20.0000 mg | Freq: Four times a day (QID) | INTRAMUSCULAR | Status: DC | PRN
  Filled 2017-04-25: qty 1

## 2017-04-25 MED ORDER — ORAL CARE MOUTH RINSE
15.0000 mL | Freq: Two times a day (BID) | OROMUCOSAL | Status: DC
  Administered 2017-04-25 – 2017-05-20 (×37): 15 mL via OROMUCOSAL

## 2017-04-25 MED ORDER — POTASSIUM CHLORIDE CRYS ER 20 MEQ PO TBCR
40.0000 meq | EXTENDED_RELEASE_TABLET | Freq: Three times a day (TID) | ORAL | Status: AC
  Administered 2017-04-25 (×2): 40 meq via ORAL
  Filled 2017-04-25 (×2): qty 2

## 2017-04-25 NOTE — ED Notes (Signed)
Please call RN for report at 0030. Number [phone]

## 2017-04-25 NOTE — Progress Notes (Signed)
CRITICAL VALUE ALERT  Critical Value:  K+2.7  Date & Time Notied:  04/25/17 0620  Provider Notified: Opyd [id number]  Orders Received/Actions taken:

## 2017-04-25 NOTE — Evaluation (Signed)
Physical Therapy Evaluation Patient Details Name: [name] MRN: [id number] DOB: [birth year] Today's Date: 04/25/2017   History of Present Illness   Tanvir Pangelinan is a 66 y.o. male with medical history significant for COPD, chronic diastolic CHF, hypertension, morbid obesity, chronic kidney disease stage II, and OSA, into the emergency department for evaluation of generalized weakness--per chart pt has been too weak to get off the couch and has been urinating and defecating on himself for days  Clinical Impression  Pt admitted with above diagnosis. Pt currently with functional limitations due to the deficits listed below (see PT Problem List).  Pt will benefit from skilled PT to increase their independence and safety with mobility to allow discharge to the venue listed below.  Pt will need SNF placement post acute; Pt is significantly weak, although difficult to determine his baseline d/t pt confusion; will continue to follow      Follow Up Recommendations SNF    Equipment Recommendations  None recommended by PT    Recommendations for Other Services       Precautions / Restrictions Precautions Precautions: Fall Precaution Comments: chronic trach with PMV      Mobility  Bed Mobility Overal bed mobility: Needs Assistance Bed Mobility: Rolling Rolling: Mod assist;Min assist         General bed mobility comments: pt performs partial turn to right with multi-modal cues for technique, participation, bed rail; pt refuses EOB  Transfers                    Ambulation/Gait                Stairs            Wheelchair Mobility    Modified Rankin (Stroke Patients Only)       Balance Overall balance assessment:  (unable to assess, pt refused)                                           Pertinent Vitals/Pain Pain Assessment: Faces Faces Pain Scale: Hurts little more Pain Location: LEs with movement Pain Descriptors / Indicators:  Sore;Grimacing Pain Intervention(s): Monitored during session    Home Living Family/patient expects to be discharged to:: Unsure Living Arrangements: Non-relatives/Friends               Additional Comments: pt reports he lives with a roommate    Prior Function Level of Independence: Independent with assistive device(s)         Comments: "I walk when I can with rollator" pt is mildly confused and an unreliable historian; he cannot state the last time he walked     Hand Dominance        Extremity/Trunk Assessment   Upper Extremity Assessment Upper Extremity Assessment:  (AROM grossly WFL)    Lower Extremity Assessment Lower Extremity Assessment: LLE deficits/detail;RLE deficits/detail RLE Deficits / Details: weaker than L, 2+/5 LLE Deficits / Details: 3/5; pt states he can't bend knee then actively bends knee ~110* to scratch ankle       Communication   Communication: Passy-Muir valve  Cognition Arousal/Alertness: Awake/alert Behavior During Therapy: Flat affect Overall Cognitive Status: Impaired/Different from baseline Area of Impairment: Orientation;Following commands;Problem solving;Safety/judgement                 Orientation Level:  (difficult to assess,pt does not answer all questions)  Following Commands: Follows one step commands with increased time;Follows multi-step commands inconsistently Safety/Judgement: Decreased awareness of deficits   Problem Solving: Slow processing;Decreased initiation;Requires verbal cues;Requires tactile cues General Comments: pt staets he is "in rehab", unable to state year or city      General Comments      Exercises     Assessment/Plan    PT Assessment Patient needs continued PT services  PT Problem List Decreased strength;Decreased range of motion;Decreased activity tolerance;Decreased mobility;Decreased knowledge of use of DME       PT Treatment Interventions DME instruction;Functional mobility  training;Therapeutic activities;Therapeutic exercise;Patient/family education    PT Goals (Ardelean goals can be found in the Care Plan section)  Acute Rehab PT Goals Patient Stated Goal: none stated PT Goal Formulation: Patient unable to participate in goal setting Time For Goal Achievement: 05/09/17 Potential to Achieve Goals: Fair    Frequency Min 2X/week   Barriers to discharge        Co-evaluation               AM-PAC PT "6 Clicks" Daily Activity  Outcome Measure Difficulty turning over in bed (including adjusting bedclothes, sheets and blankets)?: Unable Difficulty moving from lying on back to sitting on the side of the bed? : Unable Difficulty sitting down on and standing up from a chair with arms (e.g., wheelchair, bedside commode, etc,.)?: Unable Help needed moving to and from a bed to chair (including a wheelchair)?: Total Help needed walking in hospital room?: Total Help needed climbing 3-5 steps with a railing? : Total 6 Click Score: 6    End of Session   Activity Tolerance: Patient tolerated treatment well Patient left: with call bell/phone within reach;in bed;with bed alarm set   PT Visit Diagnosis: Muscle weakness (generalized) (M62.81)    Time: 3810-1751 PT Time Calculation (min) (ACUTE ONLY): 14 min   Charges:   PT Evaluation $PT Eval Moderate Complexity: 1 Mod     PT G Codes:          Naveyah Iacovelli 05-05-2017, 4:07 PM

## 2017-04-25 NOTE — Progress Notes (Addendum)
[facility] Progress Note  Subjective: pt confused but responding to most questions. No cp or SOB. Has foley in draining pus and urine.   Vitals:   04/25/17 0454 04/25/17 0735 04/25/17 0833 04/25/17 0850  BP: (!) 139/118 (!) 163/126 106/61   Pulse: 77   77  Resp: 18   18  Temp: 98.5 F (36.9 C)     TempSrc: Oral     SpO2: 93%   94%  Weight:      Height:        Inpatient medications: . chlorhexidine  15 mL Mouth Rinse BID  . clonazePAM  0.25 mg Oral QHS  . docusate sodium  100 mg Oral BID  . DULoxetine  60 mg Oral Daily  . famotidine  20 mg Oral Daily  . fluticasone  2 spray Each Nare Daily  . folic acid  1 mg Oral Daily  . heparin subcutaneous  5,000 Units Subcutaneous Q8H  . hydrALAZINE  37.5 mg Oral BID  . insulin aspart  0-5 Units Subcutaneous QHS  . insulin aspart  0-9 Units Subcutaneous TID WC  . isosorbide mononitrate  20 mg Oral BID  . mouth rinse  15 mL Mouth Rinse q12n4p  . omega-3 acid ethyl esters  1 g Oral Daily  . potassium chloride  40 mEq Oral TID  . sodium chloride flush  3 mL Intravenous Q12H   . cefTRIAXone (ROCEPHIN)  IV Stopped (04/25/17 0514)   acetaminophen **OR** acetaminophen, bisacodyl, diphenhydrAMINE, guaiFENesin-dextromethorphan, hydrALAZINE, HYDROcodone-acetaminophen, ipratropium-albuterol, ondansetron **OR** ondansetron (ZOFRAN) IV, senna-docusate  Exam: Disheveled adult, groggy but mostly awake and responsive NO jvd Neck w chronic trach Chest clear bilat  RRR no mrg ABd obese soft ntnd EXt deep wrinkles in bilat lower legs, chronic   NF, still confused , Ox 2  Home meds:  - torsemide 20 qd/ hydralazine 37.5 bid/ indapamide 2.5 mg qd/ triamterene 50 mg qd - xarelto 20 mg qhs - lipitor/ benadryl/ duoneb/ colace - (dc'd meds/ not taking > klonopin, cymbalta, pepcid, folate, imdur, lidocaine, KCl, fish oil, mometazone, zofran)      Impression: 1. AKI - baseline creat 1.2 in Aug 2017.  Suspect hypovolemia from poor intake/ on  3 diuretics in setting of probable sepsis/ UTI/ depression. Has been on the couch for 2 wks per HPI urinating and defecating on himself. Has grossly purulent urine in foley tube.  Plan cont IVF's and IV abx, holding all diuretics, f/u urine Cx, f/u renal function w/ hydration.  Renal US negative. 2. Pyuria/grossly purulent urine / leukocytosis - cx pend. Cont IV abx 3. Hypokalemia - cont KCl 4.  Hyponatremia - cont NS 75/hr 5.  Chronic DVT - cont Xarelto 6.  Anxiety/ depression - may be playing a role. SW consult requested. Not taking Klonopin/ cymbalta anymore, will dc.   7.  Anemia, normocytic - Hb 11.6, baseline 13-14. Prob d/t chronic disease. Low tsat w/ high ferritin.  8.  Chronic trach - since 2016 per pt 9.  Living situation - not sure who is helping him at home, will need further investigation, SW consulted  Plan - as above   Vinson Moselle MD [facility] pgr [phone] 04/25/2017, 12:05 PM    Recent Labs Lab 04/24/17 2059 04/25/17 0500  NA 128* 131*  K 2.7* 2.7*  CL 80* 87*  CO2 27 26  GLUCOSE 151* 136*  BUN 110* 106*  CREATININE 3.43* 3.23*  CALCIUM 9.5 9.2    Recent Labs Lab 04/24/17 2059  AST 40  ALT 23  ALKPHOS 139*  BILITOT 1.8*  PROT 8.4*  ALBUMIN 3.1*    Recent Labs Lab 04/24/17 2059 04/25/17 0500  WBC 18.2* 18.9*  NEUTROABS 15.5* 15.6*  HGB 11.6* 11.3*  HCT 33.8* 33.5*  MCV 79.3 79.4  PLT 498* 475*   Iron/TIBC/Ferritin/ %Sat    Component Value Date/Time   IRON 26 (L) 04/25/2017 0500   TIBC 223 (L) 04/25/2017 0500   FERRITIN 913 (H) 04/25/2017 0500   IRONPCTSAT 12 (L) 04/25/2017 0500

## 2017-04-25 NOTE — Progress Notes (Signed)
Pt seen, has #6 bivona cuffless trach in place, with no inner cannula.  Pt states he does not use oxygen/atc at home.  Pt states when he tried an atc in the past, it made him feel congested and does not want to wear one at this time.  Pt also states he only used cpap prior to his trach placement.  MD aware, at bedside.  Pt currently on room air, HR97, RR17, spo2 97%.  Pt has a strong cough and refused tracheal suctioning at this time.  RT will continue to monitor and assess pt.

## 2017-04-26 ENCOUNTER — Inpatient Hospital Stay (HOSPITAL_COMMUNITY): Payer: Medicaid Other

## 2017-04-26 DIAGNOSIS — J9611 Chronic respiratory failure with hypoxia: Secondary | ICD-10-CM

## 2017-04-26 LAB — HEMOGLOBIN A1C
Hgb A1c MFr Bld: 7.3 % — ABNORMAL HIGH (ref 4.8–5.6)
MEAN PLASMA GLUCOSE: 162.81 mg/dL

## 2017-04-26 LAB — CBC
HCT: 32.1 % — ABNORMAL LOW (ref 39.0–52.0)
HEMOGLOBIN: 10.5 g/dL — AB (ref 13.0–17.0)
MCH: 26.2 pg (ref 26.0–34.0)
MCHC: 32.7 g/dL (ref 30.0–36.0)
MCV: 80 fL (ref 78.0–100.0)
Platelets: 432 10*3/uL — ABNORMAL HIGH (ref 150–400)
RBC: 4.01 MIL/uL — ABNORMAL LOW (ref 4.22–5.81)
RDW: 18.1 % — ABNORMAL HIGH (ref 11.5–15.5)
WBC: 11.1 10*3/uL — AB (ref 4.0–10.5)

## 2017-04-26 LAB — COMPREHENSIVE METABOLIC PANEL
ALBUMIN: 2.7 g/dL — AB (ref 3.5–5.0)
ALK PHOS: 109 U/L (ref 38–126)
ALT: 19 U/L (ref 17–63)
ANION GAP: 12 (ref 5–15)
AST: 31 U/L (ref 15–41)
BILIRUBIN TOTAL: 0.9 mg/dL (ref 0.3–1.2)
BUN: 79 mg/dL — ABNORMAL HIGH (ref 6–20)
CALCIUM: 9 mg/dL (ref 8.9–10.3)
CO2: 26 mmol/L (ref 22–32)
Chloride: 98 mmol/L — ABNORMAL LOW (ref 101–111)
Creatinine, Ser: 2.14 mg/dL — ABNORMAL HIGH (ref 0.61–1.24)
GFR, EST AFRICAN AMERICAN: 36 mL/min — AB (ref >60)
GFR, EST NON AFRICAN AMERICAN: 31 mL/min — AB (ref >60)
Glucose, Bld: 112 mg/dL — ABNORMAL HIGH (ref 65–99)
Potassium: 3.4 mmol/L — ABNORMAL LOW (ref 3.5–5.1)
Sodium: 136 mmol/L (ref 135–145)
TOTAL PROTEIN: 7.2 g/dL (ref 6.5–8.1)

## 2017-04-26 LAB — GLUCOSE, CAPILLARY
GLUCOSE-CAPILLARY: 124 mg/dL — AB (ref 65–99)
GLUCOSE-CAPILLARY: 124 mg/dL — AB (ref 65–99)
Glucose-Capillary: 110 mg/dL — ABNORMAL HIGH (ref 65–99)
Glucose-Capillary: 116 mg/dL — ABNORMAL HIGH (ref 65–99)

## 2017-04-26 MED ORDER — GERHARDT'S BUTT CREAM
TOPICAL_CREAM | Freq: Two times a day (BID) | CUTANEOUS | Status: DC
  Administered 2017-04-26 – 2017-04-28 (×5): via TOPICAL
  Administered 2017-04-29: 1 via TOPICAL
  Administered 2017-04-29 – 2017-05-05 (×12): via TOPICAL
  Administered 2017-05-05: 1 via TOPICAL
  Administered 2017-05-06 – 2017-05-10 (×8): via TOPICAL
  Filled 2017-04-26 (×4): qty 1

## 2017-04-26 MED ORDER — HYDROCERIN EX CREA
TOPICAL_CREAM | Freq: Two times a day (BID) | CUTANEOUS | Status: AC
  Administered 2017-04-27 – 2017-04-29 (×4): via TOPICAL
  Administered 2017-04-29: 1 via TOPICAL
  Administered 2017-04-30 – 2017-05-07 (×15): via TOPICAL
  Filled 2017-04-26 (×2): qty 113

## 2017-04-26 NOTE — Consult Note (Addendum)
WOC Nurse wound consult note Reason for Consult: Patient with long-term hygiene issues, LEs with venous insufficiency, however since he has been in bed with LEs elevated since admission, edema has subsided and we are left now with deep crevices. Feet with dry, cracked and soiled areas. Wound type: venous insufficiency, moisture associated skin damage (IAD) and intertriginous dermatitis (ITD) Pressure Injury POA: N/A Measurement: several scattered areas of partial and full thickness tissue loss in the buttock, scrotal and medial thigh regions secondary to incontinence, friction and shear and even trauma from patient scratching. These are not pressure injuries. The largest area of tissue loss measures 1.5cm x 8cm x 0.2cm  on the right buttock. The lesions on the right LE in a 14cm x 8cm areas are plaque-like coverings of dry skin over healed tissue. They will be more easily removed following cleansing and moisturizing. Intertriginous dermatitis in the bilateral inguinal areas where we find caked and dried cream. Wound EXB:MWUX, moist Drainage (amount, consistency, odor) scant serous to the intertriginous areas of the bilateral inguinal and sub pannicular areas, frank bleeding to those scattered areas on the buttocks. Periwound:intact, erythematous (blanching) Dressing procedure/placement/frequency: I will place patient on a mattress replacement today to compensate for his lack of independent turning and repositioning, but still encourage staff to perform turning and repositioning as able.  We will employ pressure redistribution heel boots as he is at risk for pressure injury to the heels.  Basic twice daily skin care with our house skin cleanser followed by Dr. Lubertha South Cream in the buttocks and medial thighs will provide tissue repair and regeneration; Eucerin cream to the LEs and feet will enhance tissue recovery.  A one-time dose (or two doses) of IV diflucan or other antifungal wound be helpful in  resolving the intertriginous dermatitis, if you agree, please order. WOC nursing team will not follow, but will remain available to this patient, the nursing and medical teams.  Please re-consult if needed. Thanks, Ladona Mow, MSN, RN, GNP, [place]  Pager# [phone]

## 2017-04-26 NOTE — Plan of Care (Signed)
Problem: Urinary Elimination: Goal: Progression of disease will be identified and treated Outcome: Completed/Met Date Met: 04/26/17 Foley cathter placed on arrival to unit. Tubing remains patent.

## 2017-04-26 NOTE — Progress Notes (Signed)
Patient is A&Ox3. Discussed plan of care, importance of telemetry and intravenous site. Pt has pulled telemetry off x11. Pt has removed 1 IV site, 2nd remains intact at this time. Site rapped to assist.

## 2017-04-26 NOTE — Progress Notes (Signed)
Patient remains A&Ox3. Pt continues to pull at lines, IV, Foley and Tele. Mittens applied. Will continue to monitor.

## 2017-04-26 NOTE — Progress Notes (Signed)
PROGRESS NOTE  [name] ZOX:[id number] DOB: 03/27/1951 DOA: 04/24/2017 PCP: No primary care provider on file.  HPI/Recap of past 70 hours:  66 year old male with past mental history of chronic respiratory failure with chronic tracheostomy, chronic CHF and COPD presented to the emergency room with worsening weakness. Patient states that he had been lying on the couch unable to get up. Patient was brought in and found to have acute kidney injury and a very large UTI with pyuria. Started on IV fluids and antibiotics  Assessment/Plan: Principal Problem:   AKI (acute kidney injury) (HCC) in the setting of stage II-III chronic kidney disease: Creatinine trending downwards. Continue IV fluids Active Problems:   BMI 50.0-59.9, adult (HCC)/morbid obesity: Patient needs criteria with BMI greater than 40   Chronic deep vein thrombosis (DVT) (HCC): Already on xarelto    Obesity hypoventilation syndrome/obstructive sleep apnea with Chronic respiratory failure with hypoxia (HCC) status post tracheostomy: Currently stable    Chronic diastolic congestive heart failure (HCC): Currently dry, not volume overloaded   Hypokalemia: Replacing   Depression with anxiety:  likely contributing factor in patient's condition that got him here.  He does not appear outwardly depressed. He is fully alert and oriented 3. He might even benefit from long-term placement. He certainly amenable to going to a short-term skilled nursing facility    DM (diabetes mellitus) type II controlled with renal manifestation [facility]): Sliding scale, checking A1c     Hyponatremia    Code Status: Full code   Family Communication: No family present    Disposition Plan: Potential discharge in next few days, depending on PT eval   consultants  None   Procedures  None   Antimicrobials:  IV Rocephin 8/19-present   DVT prophxarelto   Objective: Vitals:   04/26/17 0554 04/26/17 0928 04/26/17 1020 04/26/17 1404  BP:  100/85  113/63 98/70  Pulse: 74 75  74  Resp: 20 18  18   Temp: 98.2 F (36.8 C)   98 F (36.7 C)  TempSrc: Oral   Oral  SpO2: 97%   95%  Weight: 131.5 kg (289 lb 14.5 oz)     Height:        Intake/Output Summary (Last 24 hours) at 04/26/17 1600 Last data filed at 04/26/17 1405  Gross per 24 hour  Intake          3101.67 ml  Output             4051 ml  Net          -949.33 ml   Filed Weights   04/25/17 0136 04/25/17 0235 04/26/17 0554  Weight: (!) 147.4 kg (325 lb) 133.2 kg (293 lb 10.4 oz) 131.5 kg (289 lb 14.5 oz)    Exam:   General:  Alert and oriented 3, no acute distress  HEENT: Normocephalic, chronic tracheostomy, mucous membranes are dry, neuro airway   Cardiac: Regular rate and rhythm, S1-S2   Respiratory: decreased breath sounds throughout secondary to body habitus, otherwise no crackles or wheezes, breathing not labored   Abdomen: soft, obese, nontender, positive bowel sounds   Musculoskeletal: no clubbing or cyanosis, 1+ pitting edema, chronic venous stasis   Skin: Chronic venous stasis noted, no skin breaks, tears or lesions PsychAppropriate, no evidence of psychoses  Neuro: No focal deficits    Data Reviewed: CBC:  Recent Labs Lab 04/24/17 2059 04/25/17 0500 04/26/17 0421  WBC 18.2* 18.9* 11.1*  NEUTROABS 15.5* 15.6*  --   HGB 11.6* 11.3* 10.5*  HCT 33.8* 33.5* 32.1*  MCV 79.3 79.4 80.0  PLT 498* 475* 432*   Basic Metabolic Panel:  Recent Labs Lab 04/24/17 2059 04/25/17 0500 04/26/17 0421  NA 128* 131* 136  K 2.7* 2.7* 3.4*  CL 80* 87* 98*  CO2 27 26 26   GLUCOSE 151* 136* 112*  BUN 110* 106* 79*  CREATININE 3.43* 3.23* 2.14*  CALCIUM 9.5 9.2 9.0  MG  --  3.3*  --    GFR: Estimated Creatinine Clearance: 48.9 mL/min (A) (by C-G formula based on SCr of 2.14 mg/dL (H)). Liver Function Tests:  Recent Labs Lab 04/24/17 2059 04/26/17 0421  AST 40 31  ALT 23 19  ALKPHOS 139* 109  BILITOT 1.8* 0.9  PROT 8.4* 7.2  ALBUMIN  3.1* 2.7*   No results for input(s): LIPASE, AMYLASE in the last 168 hours. No results for input(s): AMMONIA in the last 168 hours. Coagulation Profile: No results for input(s): INR, PROTIME in the last 168 hours. Cardiac Enzymes: No results for input(s): CKTOTAL, CKMB, CKMBINDEX, TROPONINI in the last 168 hours. BNP (last 3 results) No results for input(s): PROBNP in the last 8760 hours. HbA1C:  Recent Labs  04/25/17 0015  HGBA1C 7.5*   CBG:  Recent Labs Lab 04/25/17 1217 04/25/17 1726 04/25/17 2034 04/26/17 0811 04/26/17 1207  GLUCAP 131* 135* 124* 124* 124*   Lipid Profile: No results for input(s): CHOL, HDL, LDLCALC, TRIG, CHOLHDL, LDLDIRECT in the last 72 hours. Thyroid Function Tests:  Recent Labs  04/25/17 0015  TSH 1.691   Anemia Panel:  Recent Labs  04/25/17 0500  VITAMINB12 313  FOLATE 8.6  FERRITIN 913*  TIBC 223*  IRON 26*  RETICCTPCT 2.8   Urine analysis:    Component Value Date/Time   COLORURINE YELLOW 04/24/2017 2001   APPEARANCEUR CLOUDY (A) 04/24/2017 2001   LABSPEC 1.009 04/24/2017 2001   PHURINE 8.0 04/24/2017 2001   GLUCOSEU NEGATIVE 04/24/2017 2001   HGBUR LARGE (A) 04/24/2017 2001   BILIRUBINUR NEGATIVE 04/24/2017 2001   KETONESUR NEGATIVE 04/24/2017 2001   PROTEINUR 100 (A) 04/24/2017 2001   UROBILINOGEN 1.0 06/17/2015 1407   NITRITE POSITIVE (A) 04/24/2017 2001   LEUKOCYTESUR LARGE (A) 04/24/2017 2001   Sepsis Labs: @LABRCNTIP (procalcitonin:4,lacticidven:4)  ) Recent Results (from the past 240 hour(s))  Culture, Urine     Status: Abnormal (Preliminary result)   Collection Time: 04/25/17  3:30 AM  Result Value Ref Range Status   Specimen Description URINE, RANDOM  Final   Special Requests NONE  Final   Culture >=100,000 COLONIES/mL MORGANELLA MORGANII (A)  Final   Report Status PENDING  Incomplete      Studies: No results found.  Scheduled Meds: . chlorhexidine  15 mL Mouth Rinse BID  . docusate sodium  100 mg  Oral BID  . fluticasone  2 spray Each Nare Daily  . heparin subcutaneous  5,000 Units Subcutaneous Q8H  . hydrALAZINE  37.5 mg Oral BID  . insulin aspart  0-5 Units Subcutaneous QHS  . insulin aspart  0-9 Units Subcutaneous TID WC  . mouth rinse  15 mL Mouth Rinse q12n4p  . sodium chloride flush  3 mL Intravenous Q12H    Continuous Infusions: . sodium chloride 100 mL/hr at 04/26/17 0726  . cefTRIAXone (ROCEPHIN)  IV Stopped (04/25/17 2249)     LOS: 2 days     Hollice Espy, MD [facility] Pager [phone]  If 7PM-7AM, please contact night-coverage [email or web address] Password [email or web address] 04/26/2017, 4:00 PM

## 2017-04-27 DIAGNOSIS — N390 Urinary tract infection, site not specified: Secondary | ICD-10-CM

## 2017-04-27 DIAGNOSIS — A4189 Other specified sepsis: Secondary | ICD-10-CM

## 2017-04-27 LAB — COMPREHENSIVE METABOLIC PANEL
ALBUMIN: 2.8 g/dL — AB (ref 3.5–5.0)
ALK PHOS: 105 U/L (ref 38–126)
ALT: 19 U/L (ref 17–63)
AST: 32 U/L (ref 15–41)
Anion gap: 12 (ref 5–15)
BUN: 66 mg/dL — AB (ref 6–20)
CALCIUM: 9.1 mg/dL (ref 8.9–10.3)
CO2: 27 mmol/L (ref 22–32)
CREATININE: 1.69 mg/dL — AB (ref 0.61–1.24)
Chloride: 104 mmol/L (ref 101–111)
GFR calc non Af Amer: 41 mL/min — ABNORMAL LOW (ref >60)
GFR, EST AFRICAN AMERICAN: 48 mL/min — AB (ref >60)
GLUCOSE: 113 mg/dL — AB (ref 65–99)
Potassium: 3.2 mmol/L — ABNORMAL LOW (ref 3.5–5.1)
SODIUM: 143 mmol/L (ref 135–145)
Total Bilirubin: 1.1 mg/dL (ref 0.3–1.2)
Total Protein: 7.8 g/dL (ref 6.5–8.1)

## 2017-04-27 LAB — CBC
HCT: 32.1 % — ABNORMAL LOW (ref 39.0–52.0)
Hemoglobin: 10.3 g/dL — ABNORMAL LOW (ref 13.0–17.0)
MCH: 26.5 pg (ref 26.0–34.0)
MCHC: 32.1 g/dL (ref 30.0–36.0)
MCV: 82.7 fL (ref 78.0–100.0)
PLATELETS: 432 10*3/uL — AB (ref 150–400)
RBC: 3.88 MIL/uL — ABNORMAL LOW (ref 4.22–5.81)
RDW: 18.8 % — AB (ref 11.5–15.5)
WBC: 12.7 10*3/uL — ABNORMAL HIGH (ref 4.0–10.5)

## 2017-04-27 LAB — URINE CULTURE: Culture: >=100000 — AB

## 2017-04-27 LAB — GLUCOSE, CAPILLARY
GLUCOSE-CAPILLARY: 100 mg/dL — AB (ref 65–99)
GLUCOSE-CAPILLARY: 160 mg/dL — AB (ref 65–99)
Glucose-Capillary: 104 mg/dL — ABNORMAL HIGH (ref 65–99)

## 2017-04-27 MED ORDER — FLUCONAZOLE IN SODIUM CHLORIDE 100-0.9 MG/50ML-% IV SOLN
100.0000 mg | INTRAVENOUS | Status: DC
  Filled 2017-04-27: qty 50

## 2017-04-27 MED ORDER — FLUCONAZOLE 100MG IVPB
100.0000 mg | INTRAVENOUS | Status: AC
  Administered 2017-04-27 – 2017-04-28 (×2): 100 mg via INTRAVENOUS
  Filled 2017-04-27 (×2): qty 50

## 2017-04-27 MED ORDER — POTASSIUM CHLORIDE CRYS ER 20 MEQ PO TBCR
40.0000 meq | EXTENDED_RELEASE_TABLET | Freq: Once | ORAL | Status: AC
  Administered 2017-04-27: 40 meq via ORAL
  Filled 2017-04-27: qty 2

## 2017-04-27 NOTE — Progress Notes (Signed)
Per chart review patient's roommate called EMS. CSW attempted to contact patient's roommate Jolayne Panther [phone]), no answer. CSW left voicemail requesting return phone call.   Celso Sickle, [place] Clinical Social Worker [employer] Cell#: [phone]

## 2017-04-27 NOTE — Plan of Care (Signed)
Problem: Pain Managment: Goal: General experience of comfort will improve Outcome: Completed/Met Date Met: 04/27/17 After medication provided. Pt reports pain as much better 2/10.

## 2017-04-27 NOTE — Progress Notes (Signed)
CSW attempted to assess patient, patient requested that CSW come back at a later time. CSW explained to patient that it was important to discuss SNF recommendation and discharge planning due to time constraints, patient reported that it would be better for CSW to come at a later time. CSW will check back in later with patient to complete assessment and discuss discharge planning.   Celso Sickle, [place] Clinical Social Worker [facility] Cell#: [phone]

## 2017-04-27 NOTE — Progress Notes (Signed)
Patients roommate [phone]) stopped by this evening. Roommate reports patient has daughter and son but does not keep in contact.

## 2017-04-27 NOTE — Progress Notes (Signed)
RT note: Called to floor by RN stating that her Trach pt. Is having some respiratory distress, upon arrival pt. noted to be in moderate distress with frequent non-productive coughing, upon assessment of artificial airway site, RN noted #6.0 Bivona cuffless Trach to be dislodged from site with ties remaining in place, this RT noted trach having ~1" mucus plug midway, pulse oximeter reading 96% with pt. on room air, b/l b.s. auscultated with good air movement,  this RT along with RN obtained necessary emergency equipment needed to establish replacement airway, # 60XLT Shiley Proximal cuffless Trach placed/secured w/o difficulty with (+) EtC02 reading and =b/l b.s. Obtained after placement, [facility] notified, spoke with Dr. Steva Colder for notification, pt. given prn Duoneb X1, placed pt. on humidified ATC@28 %/6 lpm, remains to have continuous pulse oximeter in place, RT to monitor.

## 2017-04-27 NOTE — Progress Notes (Signed)
PROGRESS NOTE  [name] MVE:[id number] DOB: [birth year] DOA: 04/24/2017 PCP: No primary care provider on file.  HPI/Recap of past 6 hours:  66 year old male with past mental history of chronic respiratory failure with chronic tracheostomy, chronic CHF and COPD presented to the emergency room with worsening weakness. Patient states that he had been lying on the couch unable to get up. Patient was brought in and found to have acute kidney injury and in sepsis secondary to very large UTI with pyuria. Started on IV fluids and antibiotics  Overnight, no issues. Creatinine continues to trend downward. No fever, but white count went up slightly. Urine culture is positive for Morganella sensitive to Cipro and Rocephin.  Patient himself feeling a little bit better.  Assessment/Plan: Principal Problem: Sepsis secondary to UTI: Patient met criteria for sepsis on admission given lactic acidosis, leukocytosis, tachycardia, end organ dysfunction and urinary source. Urine positive for Morganella, sensitive to Cipro and Rocephin. Sepsis resolved, lactic acidosis resolved. Changed to by mouth Cipro once white count normalizes    AKI (acute kidney injury) ([place]) in the setting of stage II chronic kidney disease: Creatinine trending downwards.Peaked as high as size 3.43.  Creatinine a year ago at 1.2. Active Problems:   BMI 50.0-59.9, adult (HCC)/morbid obesity: Patient needs criteria with BMI greater than 40   Chronic deep vein thrombosis (DVT) ([place]): Already on xarelto    Obesity hypoventilation syndrome/obstructive sleep apnea with Chronic respiratory failure with hypoxia (HCC) status post tracheostomy: Currently stable    Chronic diastolic congestive heart failure ([place]): Currently dry, not volume overloaded   Hypokalemia: Replacing   Depression with anxiety:  likely contributing factor in patient's condition that got him here.  He does not appear outwardly depressed. He is fully alert and oriented 3.  He might even benefit from long-term placement. He certainly amenable to going to a short-term skilled nursing facility     DM (diabetes mellitus) type II controlled with renal manifestation [facility]): Sliding scale, A1c at 7.3.  Interestingly, he is not on any medications at home. Diet controlled. Cover with sliding scale.    Hyponatremia: Secondary dehydration. With IV fluids, sodium improved from 128 on admission to 136 over the next 24 hours.  Moisture associated skin damage and intertriginous dermatitis: Appreciate wound care help. However IV Diflucan 2 doses. Dressings as per wound care.  Essential hypertension: Will resume once creatinine normalized.    Code Status: Full code   Family Communication: No family present    Disposition Plan: Potential discharge in next few days, depending on PT eval   consultants  None   Procedures  None   Antimicrobials:  IV Rocephin 8/19-present   DVT prophxarelto   Objective: Vitals:   04/27/17 0454 04/27/17 0457 04/27/17 0821 04/27/17 0916  BP:  119/69  123/61  Pulse:   79   Resp:  20 18   Temp:  99 F (37.2 C)    TempSrc:  Oral    SpO2: 97% 96% 96%   Weight:      Height:        Intake/Output Summary (Last 24 hours) at 04/27/17 1011 Last data filed at 04/27/17 0600  Gross per 24 hour  Intake             3010 ml  Output             2951 ml  Net               59 ml   Filed  Weights   04/25/17 0136 04/25/17 0235 04/26/17 0554  Weight: (!) 147.4 kg (325 lb) 133.2 kg (293 lb 10.4 oz) 131.5 kg (289 lb 14.5 oz)    Exam:   General:  Alert and oriented 3, no acute distress  HEENT: Normocephalic, chronic tracheostomy, mucous membranes are dry, neuro airway   Cardiac: Regular rate and rhythm, S1-S2   Respiratory: decreased breath sounds throughout secondary to body habitus, otherwise no crackles or wheezes, breathing not labored   Abdomen: soft, obese, nontender, positive bowel sounds   Musculoskeletal: no clubbing  or cyanosis, 1+ pitting edema, chronic venous stasis   Skin: Chronic venous stasis noted, no skin breaks, tears or lesions. Dermatitis on lower extremities PsychAppropriate, no evidence of psychoses  Neuro: No focal deficits    Data Reviewed: CBC:  Recent Labs Lab 04/24/17 2059 04/25/17 0500 04/26/17 0421 04/27/17 0404  WBC 18.2* 18.9* 11.1* 12.7*  NEUTROABS 15.5* 15.6*  --   --   HGB 11.6* 11.3* 10.5* 10.3*  HCT 33.8* 33.5* 32.1* 32.1*  MCV 79.3 79.4 80.0 82.7  PLT 498* 475* 432* 470*   Basic Metabolic Panel:  Recent Labs Lab 04/24/17 2059 04/25/17 0500 04/26/17 0421 04/27/17 0404  NA 128* 131* 136 143  K 2.7* 2.7* 3.4* 3.2*  CL 80* 87* 98* 104  CO2 '27 26 26 27  '$ GLUCOSE 151* 136* 112* 113*  BUN 110* 106* 79* 66*  CREATININE 3.43* 3.23* 2.14* 1.69*  CALCIUM 9.5 9.2 9.0 9.1  MG  --  3.3*  --   --    GFR: Estimated Creatinine Clearance: 61.9 mL/min (A) (by C-G formula based on SCr of 1.69 mg/dL (H)). Liver Function Tests:  Recent Labs Lab 04/24/17 2059 04/26/17 0421 04/27/17 0404  AST 40 31 32  ALT '23 19 19  '$ ALKPHOS 139* 109 105  BILITOT 1.8* 0.9 1.1  PROT 8.4* 7.2 7.8  ALBUMIN 3.1* 2.7* 2.8*   No results for input(s): LIPASE, AMYLASE in the last 168 hours. No results for input(s): AMMONIA in the last 168 hours. Coagulation Profile: No results for input(s): INR, PROTIME in the last 168 hours. Cardiac Enzymes: No results for input(s): CKTOTAL, CKMB, CKMBINDEX, TROPONINI in the last 168 hours. BNP (last 3 results) No results for input(s): PROBNP in the last 8760 hours. HbA1C:  Recent Labs  04/25/17 0015 04/26/17 1757  HGBA1C 7.5* 7.3*   CBG:  Recent Labs Lab 04/26/17 0811 04/26/17 1207 04/26/17 1634 04/26/17 2144 04/27/17 0733  GLUCAP 124* 124* 110* 116* 100*   Lipid Profile: No results for input(s): CHOL, HDL, LDLCALC, TRIG, CHOLHDL, LDLDIRECT in the last 72 hours. Thyroid Function Tests:  Recent Labs  04/25/17 0015  TSH 1.691     Anemia Panel:  Recent Labs  04/25/17 0500  VITAMINB12 313  FOLATE 8.6  FERRITIN 913*  TIBC 223*  IRON 26*  RETICCTPCT 2.8   Urine analysis:    Component Value Date/Time   COLORURINE YELLOW 04/24/2017 2001   APPEARANCEUR CLOUDY (A) 04/24/2017 2001   LABSPEC 1.009 04/24/2017 2001   PHURINE 8.0 04/24/2017 2001   GLUCOSEU NEGATIVE 04/24/2017 2001   HGBUR LARGE (A) 04/24/2017 2001   BILIRUBINUR NEGATIVE 04/24/2017 2001   [place] 04/24/2017 2001   PROTEINUR 100 (A) 04/24/2017 2001   UROBILINOGEN 1.0 06/17/2015 1407   NITRITE POSITIVE (A) 04/24/2017 2001   LEUKOCYTESUR LARGE (A) 04/24/2017 2001   Sepsis Labs: '@LABRCNTIP'$ (procalcitonin:4,lacticidven:4)  ) Recent Results (from the past 240 hour(s))  Culture, Urine     Status:  Abnormal   Collection Time: 04/25/17  3:30 AM  Result Value Ref Range Status   Specimen Description URINE, RANDOM  Final   Special Requests NONE  Final   Culture >=100,000 COLONIES/mL MORGANELLA MORGANII (A)  Final   Report Status 04/27/2017 FINAL  Final   Organism ID, Bacteria MORGANELLA MORGANII (A)  Final      Susceptibility   Morganella morganii - MIC*    AMPICILLIN >=32 RESISTANT Resistant     CEFAZOLIN >=64 RESISTANT Resistant     CEFTRIAXONE 2 SENSITIVE Sensitive     CIPROFLOXACIN 1 SENSITIVE Sensitive     GENTAMICIN <=1 SENSITIVE Sensitive     IMIPENEM 1 SENSITIVE Sensitive     NITROFURANTOIN 128 RESISTANT Resistant     TRIMETH/SULFA >=320 RESISTANT Resistant     AMPICILLIN/SULBACTAM >=32 RESISTANT Resistant     PIP/TAZO <=4 SENSITIVE Sensitive     * >=100,000 COLONIES/mL MORGANELLA MORGANII      Studies: Dg Chest Port 1 View  Result Date: 04/27/2017 CLINICAL DATA:  Re- insertion of tracheostomy tube. EXAM: PORTABLE CHEST 1 VIEW COMPARISON:  04/24/2017 FINDINGS: The tip of a tracheostomy tube is seen centered over the superior mediastinum and upper trachea with tip terminating at the level of the aortic arch. Heart is  enlarged with minimal aortic atherosclerosis. Mild vascular congestion is noted. No effusion or pneumothorax. No pulmonary consolidation. No acute nor suspicious osseous abnormality. IMPRESSION: 1. Cardiomegaly with mild pulmonary vascular congestion. 2. Minimal aortic atherosclerosis. 3. Tip of a tracheostomy tube is centered within the upper trachea with tip extending to the level of the aortic arch. Electronically Signed   By: Ashley Royalty M.D.   On: 04/27/2017 00:21    Scheduled Meds: . chlorhexidine  15 mL Mouth Rinse BID  . docusate sodium  100 mg Oral BID  . fluticasone  2 spray Each Nare Daily  . Gerhardt's butt cream   Topical BID  . heparin subcutaneous  5,000 Units Subcutaneous Q8H  . hydrALAZINE  37.5 mg Oral BID  . hydrocerin   Topical BID  . insulin aspart  0-5 Units Subcutaneous QHS  . insulin aspart  0-9 Units Subcutaneous TID WC  . mouth rinse  15 mL Mouth Rinse q12n4p  . sodium chloride flush  3 mL Intravenous Q12H    Continuous Infusions: . sodium chloride 100 mL/hr at 04/27/17 0916  . cefTRIAXone (ROCEPHIN)  IV Stopped (04/26/17 2222)  . fluconazole (DIFLUCAN) IV       LOS: 3 days     Annita Brod, MD [facility] Pager [phone]  If 7PM-7AM, please contact night-coverage [email or web address] Password [email or web address] 04/27/2017, 10:11 AM

## 2017-04-27 NOTE — Progress Notes (Signed)
CSW attempted to speak with patient on two more occassions. CSW inquired about patient's plans at discharge, patient did not respond to CSW questions. Patient mumbled some thing, CSW unable to understand. CSW asked patient to repeat himself, patient did not respond. CSW explained to patient that it is important to discuss discharge planning to ensure patient has a safe place to go at discharge, patient did not respond. Patient's RN also tried to engage patient in conversation about discharge planning, patient did not respond. CSW emphasized the importance of discharge planning, patient did not respond. CSW informed patient that RNCM would try to speak with him as well, patient responded okay.   CSW spoke with patient's RN who reported patient is alert, oriented and verbal. CSW will continue to attempt to assess to assist patient with discharge planning.   Celso Sickle, [place] Clinical Social Worker [facility] Cell#: [phone]

## 2017-04-27 NOTE — Progress Notes (Signed)
Spoke with pt concerning discharge plans. Pt would not talk at all.

## 2017-04-27 NOTE — Procedures (Signed)
Bedside Tracheostomy Insertion Procedure Note   Patient Details:   Name: [name] DOB: 1951-07-18 MRN: [id number]  Procedure: Tracheostomy  Pre Procedure Assessment: ET Tube Size: ET Tube secured at lip (cm): Bite block in place: No Breath Sounds: Clear  Post Procedure Assessment: BP 109/61 (BP Location: Right Arm)   Pulse 74   Temp 98.3 F (36.8 C) (Oral)   Resp 20   Ht 5\' 11"  (1.803 m)   Wt 289 lb 14.5 oz (131.5 kg)   SpO2 99%   BMI 40.43 kg/m  O2 sats: stable throughout Complications: No apparent complications Patient did tolerate procedure well Tracheostomy Brand:Shiley Tracheostomy Style:Cuffed Tracheostomy Size: Shiley 60XLT Proximal Cuffless Tracheostomy Secured RAX:[id number] Tracheostomy Placement Confirmation:Chest X ray ordered for placement (+)EtC02 reading  Joylene John 04/27/2017, 12:48 AM

## 2017-04-28 DIAGNOSIS — N182 Chronic kidney disease, stage 2 (mild): Secondary | ICD-10-CM

## 2017-04-28 LAB — BASIC METABOLIC PANEL
Anion gap: 9 (ref 5–15)
BUN: 37 mg/dL — AB (ref 6–20)
CALCIUM: 9.1 mg/dL (ref 8.9–10.3)
CO2: 27 mmol/L (ref 22–32)
CREATININE: 1.3 mg/dL — AB (ref 0.61–1.24)
Chloride: 110 mmol/L (ref 101–111)
GFR calc Af Amer: >60 mL/min (ref >60)
GFR, EST NON AFRICAN AMERICAN: 57 mL/min — AB (ref >60)
Glucose, Bld: 96 mg/dL (ref 65–99)
POTASSIUM: 3.5 mmol/L (ref 3.5–5.1)
SODIUM: 146 mmol/L — AB (ref 135–145)

## 2017-04-28 LAB — CBC
HCT: 33.9 % — ABNORMAL LOW (ref 39.0–52.0)
Hemoglobin: 10.7 g/dL — ABNORMAL LOW (ref 13.0–17.0)
MCH: 26.4 pg (ref 26.0–34.0)
MCHC: 31.6 g/dL (ref 30.0–36.0)
MCV: 83.5 fL (ref 78.0–100.0)
PLATELETS: 414 10*3/uL — AB (ref 150–400)
RBC: 4.06 MIL/uL — ABNORMAL LOW (ref 4.22–5.81)
RDW: 19.2 % — AB (ref 11.5–15.5)
WBC: 8.9 10*3/uL (ref 4.0–10.5)

## 2017-04-28 LAB — GLUCOSE, CAPILLARY
GLUCOSE-CAPILLARY: 162 mg/dL — AB (ref 65–99)
GLUCOSE-CAPILLARY: 110 mg/dL — AB (ref 65–99)
GLUCOSE-CAPILLARY: 100 mg/dL — AB (ref 65–99)
Glucose-Capillary: 99 mg/dL (ref 65–99)

## 2017-04-28 LAB — BLOOD GAS, ARTERIAL
ACID-BASE EXCESS: 4 mmol/L — AB (ref 0.0–2.0)
BICARBONATE: 27.1 mmol/L (ref 20.0–28.0)
Drawn by: 331471
O2 Saturation: 96.3 %
PCO2 ART: 36.5 mmHg (ref 32.0–48.0)
PH ART: 7.484 — AB (ref 7.350–7.450)
Patient temperature: 98.6
pO2, Arterial: 79.9 mmHg — ABNORMAL LOW (ref 83.0–108.0)

## 2017-04-28 NOTE — Progress Notes (Signed)
Patient stated his roommate Erasmo Downer is suppose to take care of him. Patient also stated the roommate has access to the patient's finances. Will continue to monitor closely

## 2017-04-28 NOTE — Progress Notes (Signed)
PROGRESS NOTE  [name] BTD:[id number] DOB: 1951-05-01 DOA: 04/24/2017 PCP: No primary care provider on file.   LOS: 4 days   Brief Narrative / Interim history:  66 year old male with past mental history of chronic respiratory failure with chronic tracheostomy, chronic diastolic CHF and COPD presented to the emergency room with worsening weakness. Patient states that he had been lying on the couch unable to get up. Patient was brought in and found to have acute kidney injury and in sepsis secondary to UTI with pyuria. Started on IV fluids and antibiotics  Assessment & Plan: Principal Problem:   AKI (acute kidney injury) ([place]) Active Problems:   BMI 50.0-59.9, adult (HCC)   Chronic deep vein thrombosis (DVT) (HCC)   Obesity hypoventilation syndrome (HCC)   Chronic respiratory failure with hypoxia (HCC)   Tracheostomy status (HCC)   Chronic diastolic congestive heart failure (HCC)   Hypokalemia   Depression with anxiety   Chronic renal failure, stage 2 (mild)   DM (diabetes mellitus) type II controlled with renal manifestation (HCC)   OSA (obstructive sleep apnea)   Hyponatremia   Failure to thrive in adult   Obesity, Class III, BMI 40-49.9 (morbid obesity) ([place])   Sepsis due to UTI -He met criteria for sepsis on admission with lactic acid elevation, white count, tachycardia -Sepsis physiology improved, urine cultures were positive for Morganella -He is encephalopathic and confused today, continue IV antibiotics for now  Acute encephalopathy -Likely due to #1 versus other, patient with chronic respiratory failure, ABG was checked this morning and did not appear to have CO2 retention -TSH was normal -Rule out other causes, obtain MRI of the brain -Nurse today had the patient over the weekend and he has been confused for the past several days, pulling his IVs and requiring mittens  Acute kidney injury with underlying chronic kidney disease stage II -Creatinine continues to  improve.  It was as high as 3.4 during this hospitalization, currently at 1.3 -Continue fluids but decrease rate today, will probably need to stop fluids 8/23  Morbid obesity  Obesity hypoventilation syndrome/obstructive sleep apnea with chronic hypoxic and hypercarbic respiratory failure -Respiratory status appears stable, he has tracheostomy in place -ABG this morning reassuring  Chronic diastolic CHF -He appears euvolemic today, decrease the rate of fluids  Hyperkalemia -Replacing  Depression & anxiety -Stable  Type 2 diabetes mellitus -Diet-controlled, A1c 7.3, continue sliding scale for now  Hyponatremia -Resolved  Moisture associated skin damage and intertriginous dermatitis -Appreciate wound care help. IV Diflucan 2 doses. Dressings as per wound care.  Hypertension -Continue hydralazine, currently controlled   DVT prophylaxis: heparin Code Status: Full code Family Communication: No family at bedside Disposition Plan: Home versus SNF when mental status improves  Consultants:   None  Procedures:   None   Antimicrobials:  Ceftriaxone 8/19 >>  Subjective: - no chest pain, shortness of breath, no abdominal pain, nausea or vomiting.  Confused  Objective: Vitals:   04/27/17 2326 04/28/17 0455 04/28/17 0717 04/28/17 0756  BP:   128/76   Pulse: 74 69 98   Resp: '18 18 18   '$ Temp:   97.9 F (36.6 C)   TempSrc:   Oral   SpO2: 98% 98% 98% 95%  Weight:  124.5 kg (274 lb 8 oz)    Height:        Intake/Output Summary (Last 24 hours) at 04/28/17 1026 Last data filed at 04/28/17 0825  Gross per 24 hour  Intake  2650 ml  Output             1875 ml  Net              775 ml   Filed Weights   04/25/17 0235 04/26/17 0554 04/28/17 0455  Weight: 133.2 kg (293 lb 10.4 oz) 131.5 kg (289 lb 14.5 oz) 124.5 kg (274 lb 8 oz)    Examination:  Vitals:   04/27/17 2326 04/28/17 0455 04/28/17 0717 04/28/17 0756  BP:   128/76   Pulse: 74 69 98   Resp:  '18 18 18   '$ Temp:   97.9 F (36.6 C)   TempSrc:   Oral   SpO2: 98% 98% 98% 95%  Weight:  124.5 kg (274 lb 8 oz)    Height:        Constitutional: NAD, mittens on Eyes: lids and conjunctivae normal Respiratory: clear to auscultation bilaterally, no wheezing, no crackles. Normal respiratory effort. No accessory muscle use.  Trach in place Cardiovascular: Regular rate and rhythm, no murmurs / rubs / gallops. No LE edema. 2+ pedal pulses Abdomen: no tenderness. Bowel sounds positive.  Neurologic: non focal    Data Reviewed: I have independently reviewed following labs and imaging studies   CBC:  Recent Labs Lab 04/24/17 2059 04/25/17 0500 04/26/17 0421 04/27/17 0404 04/28/17 0513  WBC 18.2* 18.9* 11.1* 12.7* 8.9  NEUTROABS 15.5* 15.6*  --   --   --   HGB 11.6* 11.3* 10.5* 10.3* 10.7*  HCT 33.8* 33.5* 32.1* 32.1* 33.9*  MCV 79.3 79.4 80.0 82.7 83.5  PLT 498* 475* 432* 432* 144*   Basic Metabolic Panel:  Recent Labs Lab 04/24/17 2059 04/25/17 0500 04/26/17 0421 04/27/17 0404 04/28/17 0513  NA 128* 131* 136 143 146*  K 2.7* 2.7* 3.4* 3.2* 3.5  CL 80* 87* 98* 104 110  CO2 '27 26 26 27 27  '$ GLUCOSE 151* 136* 112* 113* 96  BUN 110* 106* 79* 66* 37*  CREATININE 3.43* 3.23* 2.14* 1.69* 1.30*  CALCIUM 9.5 9.2 9.0 9.1 9.1  MG  --  3.3*  --   --   --    GFR: Estimated Creatinine Clearance: 78.2 mL/min (A) (by C-G formula based on SCr of 1.3 mg/dL (H)). Liver Function Tests:  Recent Labs Lab 04/24/17 2059 04/26/17 0421 04/27/17 0404  AST 40 31 32  ALT '23 19 19  '$ ALKPHOS 139* 109 105  BILITOT 1.8* 0.9 1.1  PROT 8.4* 7.2 7.8  ALBUMIN 3.1* 2.7* 2.8*   No results for input(s): LIPASE, AMYLASE in the last 168 hours. No results for input(s): AMMONIA in the last 168 hours. Coagulation Profile: No results for input(s): INR, PROTIME in the last 168 hours. Cardiac Enzymes: No results for input(s): CKTOTAL, CKMB, CKMBINDEX, TROPONINI in the last 168 hours. BNP (last 3  results) No results for input(s): PROBNP in the last 8760 hours. HbA1C:  Recent Labs  04/26/17 1757  HGBA1C 7.3*   CBG:  Recent Labs Lab 04/26/17 2144 04/27/17 0733 04/27/17 1134 04/27/17 2342 04/28/17 0824  GLUCAP 116* 100* 160* 104* 162*   Lipid Profile: No results for input(s): CHOL, HDL, LDLCALC, TRIG, CHOLHDL, LDLDIRECT in the last 72 hours. Thyroid Function Tests: No results for input(s): TSH, T4TOTAL, FREET4, T3FREE, THYROIDAB in the last 72 hours. Anemia Panel: No results for input(s): VITAMINB12, FOLATE, FERRITIN, TIBC, IRON, RETICCTPCT in the last 72 hours. Urine analysis:    Component Value Date/Time   COLORURINE YELLOW 04/24/2017 2001   APPEARANCEUR  CLOUDY (A) 04/24/2017 2001   LABSPEC 1.009 04/24/2017 2001   PHURINE 8.0 04/24/2017 2001   GLUCOSEU NEGATIVE 04/24/2017 2001   HGBUR LARGE (A) 04/24/2017 2001   BILIRUBINUR NEGATIVE 04/24/2017 2001   KETONESUR NEGATIVE 04/24/2017 2001   PROTEINUR 100 (A) 04/24/2017 2001   UROBILINOGEN 1.0 06/17/2015 1407   NITRITE POSITIVE (A) 04/24/2017 2001   LEUKOCYTESUR LARGE (A) 04/24/2017 2001   Sepsis Labs: Invalid input(s): PROCALCITONIN, LACTICIDVEN  Recent Results (from the past 240 hour(s))  Culture, Urine     Status: Abnormal   Collection Time: 04/25/17  3:30 AM  Result Value Ref Range Status   Specimen Description URINE, RANDOM  Final   Special Requests NONE  Final   Culture >=100,000 COLONIES/mL MORGANELLA MORGANII (A)  Final   Report Status 04/27/2017 FINAL  Final   Organism ID, Bacteria MORGANELLA MORGANII (A)  Final      Susceptibility   Morganella morganii - MIC*    AMPICILLIN >=32 RESISTANT Resistant     CEFAZOLIN >=64 RESISTANT Resistant     CEFTRIAXONE 2 SENSITIVE Sensitive     CIPROFLOXACIN 1 SENSITIVE Sensitive     GENTAMICIN <=1 SENSITIVE Sensitive     IMIPENEM 1 SENSITIVE Sensitive     NITROFURANTOIN 128 RESISTANT Resistant     TRIMETH/SULFA >=320 RESISTANT Resistant      AMPICILLIN/SULBACTAM >=32 RESISTANT Resistant     PIP/TAZO <=4 SENSITIVE Sensitive     * >=100,000 COLONIES/mL [facility] MORGANII      Radiology Studies: Dg Chest Port 1 View  Result Date: 04/27/2017 CLINICAL DATA:  Re- insertion of tracheostomy tube. EXAM: PORTABLE CHEST 1 VIEW COMPARISON:  04/24/2017 FINDINGS: The tip of a tracheostomy tube is seen centered over the superior mediastinum and upper trachea with tip terminating at the level of the aortic arch. Heart is enlarged with minimal aortic atherosclerosis. Mild vascular congestion is noted. No effusion or pneumothorax. No pulmonary consolidation. No acute nor suspicious osseous abnormality. IMPRESSION: 1. Cardiomegaly with mild pulmonary vascular congestion. 2. Minimal aortic atherosclerosis. 3. Tip of a tracheostomy tube is centered within the upper trachea with tip extending to the level of the aortic arch. Electronically Signed   By: Ashley Royalty M.D.   On: 04/27/2017 00:21     Scheduled Meds: . chlorhexidine  15 mL Mouth Rinse BID  . docusate sodium  100 mg Oral BID  . fluticasone  2 spray Each Nare Daily  . Gerhardt's butt cream   Topical BID  . heparin subcutaneous  5,000 Units Subcutaneous Q8H  . hydrALAZINE  37.5 mg Oral BID  . hydrocerin   Topical BID  . insulin aspart  0-5 Units Subcutaneous QHS  . insulin aspart  0-9 Units Subcutaneous TID WC  . mouth rinse  15 mL Mouth Rinse q12n4p  . sodium chloride flush  3 mL Intravenous Q12H   Continuous Infusions: . sodium chloride 100 mL/hr at 04/28/17 0032  . cefTRIAXone (ROCEPHIN)  IV Stopped (04/28/17 0032)    Marzetta Board, MD, PhD [facility] Pager [phone]  If 7PM-7AM, please contact night-coverage [email or web address] Password [facility] 04/28/2017, 10:26 AM
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
## 2017-04-28 NOTE — Progress Notes (Signed)
Physical Therapy Treatment Patient Details Name: [name] MRN: [id number] DOB: 05/22/1951 Today's Date: 04/28/2017    History of Present Illness  [name] is a 66 y.o. male with medical history significant for COPD, chronic diastolic CHF, hypertension, morbid obesity, chronic kidney disease stage II, and OSA, into the emergency department for evaluation of generalized weakness--per chart pt has been too weak to get off the couch and has been urinating and defecating on himself for days    PT Comments    Received new set of PT orders, although pt was on caseload.  Continue to recommend PT recommendation from initial evaluation of SNF.  Pt confused and not able to follow directions consistently.     Follow Up Recommendations  SNF     Equipment Recommendations  None recommended by PT    Recommendations for Other Services       Precautions / Restrictions Precautions Precautions: Fall Precaution Comments: chronic trach with PMV    Mobility  Bed Mobility Overal bed mobility: Needs Assistance Bed Mobility: Rolling Rolling: Max assist         General bed mobility comments: Pt began to initiate roll with heavy cueing and then would state, "I'm just not gong to do that today"  Transfers                    Ambulation/Gait                 Stairs            Wheelchair Mobility    Modified Rankin (Stroke Patients Only)       Balance                                            Cognition Arousal/Alertness: Awake/alert Behavior During Therapy: Restless Overall Cognitive Status: Impaired/Different from baseline Area of Impairment: Orientation;Following commands;Problem solving;Safety/judgement                 Orientation Level: Disoriented to;Time;Situation;Place     Following Commands: Follows one step commands inconsistently Safety/Judgement: Decreased awareness of safety;Decreased awareness of deficits    Problem Solving: Slow processing;Decreased initiation General Comments: Pt not oriented to situation. Pt could answer a question appropriately and then next question he would anwer inappropriately.  At one point he thought he saw a ghost.      Exercises      General Comments        Pertinent Vitals/Pain Pain Assessment: No/denies pain    Home Living                      Prior Function            PT Goals ([place] goals can now be found in the care plan section) Acute Rehab PT Goals Patient Stated Goal: none stated PT Goal Formulation: Patient unable to participate in goal setting Time For Goal Achievement: 05/09/17 Potential to Achieve Goals: Fair Progress towards PT goals: Not progressing toward goals - comment (Pt having difficulty following instructions)    Frequency    Min 2X/week      PT Plan [place] plan remains appropriate    Co-evaluation              AM-PAC PT "6 Clicks" Daily Activity  Outcome Measure  Difficulty turning over in bed (including adjusting bedclothes, sheets and  blankets)?: Unable Difficulty moving from lying on back to sitting on the side of the bed? : Unable Difficulty sitting down on and standing up from a chair with arms (e.g., wheelchair, bedside commode, etc,.)?: Unable Help needed moving to and from a bed to chair (including a wheelchair)?: Total Help needed walking in hospital room?: Total Help needed climbing 3-5 steps with a railing? : Total 6 Click Score: 6    End of Session   Activity Tolerance: Other (comment) (Pt limited by confusion) Patient left: with call bell/phone within reach;with bed alarm set Nurse Communication: Mobility status PT Visit Diagnosis: Muscle weakness (generalized) (M62.81)     Time: 1610-9604 PT Time Calculation (min) (ACUTE ONLY): 19 min  Charges:  $Therapeutic Activity: 8-22 mins                    G Codes:       [name] L. [name], [name] Pager [phone] 04/28/2017   Enzo Montgomery 04/28/2017, 10:51 AM

## 2017-04-29 DIAGNOSIS — Z6841 Body Mass Index (BMI) 40.0 and over, adult: Secondary | ICD-10-CM

## 2017-04-29 LAB — GLUCOSE, CAPILLARY
GLUCOSE-CAPILLARY: 110 mg/dL — AB (ref 65–99)
Glucose-Capillary: 93 mg/dL (ref 65–99)
Glucose-Capillary: 123 mg/dL — ABNORMAL HIGH (ref 65–99)
Glucose-Capillary: 93 mg/dL (ref 65–99)

## 2017-04-29 LAB — BASIC METABOLIC PANEL
Anion gap: 11 (ref 5–15)
BUN: 24 mg/dL — ABNORMAL HIGH (ref 6–20)
CALCIUM: 9.1 mg/dL (ref 8.9–10.3)
CHLORIDE: 107 mmol/L (ref 101–111)
CO2: 27 mmol/L (ref 22–32)
CREATININE: 1.26 mg/dL — AB (ref 0.61–1.24)
GFR calc Af Amer: >60 mL/min (ref >60)
GFR calc non Af Amer: 59 mL/min — ABNORMAL LOW (ref >60)
GLUCOSE: 90 mg/dL (ref 65–99)
Potassium: 3.9 mmol/L (ref 3.5–5.1)
Sodium: 145 mmol/L (ref 135–145)

## 2017-04-29 NOTE — Plan of Care (Signed)
AC round on patient, but received the same response -  "You are not going to help me clean up." Patient educated and informed that one of the main reasons he is here - is to treat his wounds and infection.  By not allowing [place] to clean his wounds, he is refusing care and the doctor will need to be notified. Patient agreed. Dr. Text to inform.

## 2017-04-29 NOTE — Consult Note (Addendum)
WOC Nurse wound consult note Patient was seen 8/20 by WOC team for same Reason for Consult:butock wounds Wound type: Incontinence associated skin damage, partial thickness Pressure Injury POA: NA Measurement: Patient is obese, frequent fecal incontinence, liquid stool Left upper buttock has 8cm x 2cm x 0.2cm Left lower medial buttock has 1cm circle x 0.1 cm and 2cm x 1cm x 0.1cm Left lower buttock has healing 2cm x 1.5cm partial thickness Right medial buttock has 3cm x 2cm x 0.1cm  Left testicle has open bleeding area 2cm circle x 0.1cm (Incidentally right testicle is enlarged, firm, and sitting much higher in sac up onto the pubic area, discussed with bedside RN to make sure MD has seen.) Right heel has a 3cm circle of healed area that looks like previous blister resolved. It is not in a pressure area. Pt has on old broken Prevalon Boots covered in stool at present, will order new pair. His lower extremities area extremely dry with scaly peeling skin. My associate had already ordered Eucerin Cream. All wounds have pink wound beds, scant drainage, no odor Wound bed: all pink Drainage (amount, consistency, odor) none Periwound: intact  Dressing procedure/placement/frequency:I have provided nurses with orders for New Prevalon Boots due to damage to [place] pair. Pt already seen by my associate and is already on air mattress.  Eucerine cream has already been ordered. Gerhardts Butt Cream was ordered previously. If liquid stool continues, fecal collection pouch may need to be considered, discussed with bedside RN. We will not follow, but will remain available to this patient, to nursing, and the medical and/or surgical teams.  Please re-consult if we need to assist further.   Barnett Hatter, RN-C, WTA-C, [facility] Wound Treatment Associate '

## 2017-04-29 NOTE — Plan of Care (Signed)
Patient refusing to allow staff to re-position him

## 2017-04-29 NOTE — Plan of Care (Addendum)
Tele sitter contacted to inform patient is attempting to remove guaze wrap.  When got down to room guaze had been completely removed and "hidden" from staff.  Guaze wrap replaced and re-secured with tape.  Patient verbally stated, " you just keep re-wrapping this - It's been re-wrapped several times this morning."
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
## 2017-04-29 NOTE — Plan of Care (Addendum)
Patient removed gauze wrap from IV.  Noticed by staff and guaze wrap replaced. Tele sitter also phoned and asked to inform if patient is pulling on lines or guaze wrap.

## 2017-04-29 NOTE — Plan of Care (Signed)
Patient IV restarted and wrapped. IV fluids re-started.  MRI contacted requesting to be done as soon as possible since patient has tendency to remove lines and is pending discharge (based on results).  MRI stated they would try to "fit him in" hopefully between 12-1 today. Patient informed.

## 2017-04-29 NOTE — Plan of Care (Signed)
Patient still refusing to allow staff to re-position him in bed.

## 2017-04-29 NOTE — Progress Notes (Signed)
PROGRESS NOTE  [name] EXB:[id number] DOB: [birth year] DOA: 04/24/2017 PCP: No primary care provider on file.   LOS: 5 days   Brief Narrative / Interim history:  66 year old male with past mental history of chronic respiratory failure with chronic tracheostomy, chronic diastolic CHF and COPD presented to the emergency room with worsening weakness. Patient states that he had been lying on the couch unable to get up. Patient was brought in and found to have acute kidney injury and in sepsis secondary to UTI with pyuria. Started on IV fluids and antibiotics  Assessment & Plan: Principal Problem:   AKI (acute kidney injury) ([place]) Active Problems:   BMI 50.0-59.9, adult (HCC)   Chronic deep vein thrombosis (DVT) (HCC)   Obesity hypoventilation syndrome (HCC)   Chronic respiratory failure with hypoxia (HCC)   Tracheostomy status (HCC)   Chronic diastolic congestive heart failure (HCC)   Hypokalemia   Depression with anxiety   Chronic renal failure, stage 2 (mild)   DM (diabetes mellitus) type II controlled with renal manifestation (HCC)   OSA (obstructive sleep apnea)   Hyponatremia   Failure to thrive in adult   Obesity, Class III, BMI 40-49.9 (morbid obesity) ([place])   Sepsis due to UTI -He met criteria for sepsis on admission with lactic acid elevation, white count, tachycardia -Sepsis physiology improved, urine cultures were positive for Morganella -He is encephalopathic and confused still, continue IV antibiotics for now  Acute encephalopathy -May have some degree of confusion at baseline per his friend -TSH was normal -Rule out other causes, obtain MRI of the brain, which is still pending this morning -Have asked psychiatry to evaluate.  Currently patient with impulsive and unpredictable behavior willing on his IVs and pulling on his trach tube.  He can answer orientation questions for most part, but I suspect he may lack capacity  Acute kidney injury with underlying  chronic kidney disease stage II -Creatinine continues to improve.  It was as high as 3.4 during this hospitalization, currently at 1.3 -Stop IV fluids today  Morbid obesity  Obesity hypoventilation syndrome/obstructive sleep apnea with chronic hypoxic and hypercarbic respiratory failure -Respiratory status appears stable, he has tracheostomy in place, however patient attempted to self discontinue the trach today  Chronic diastolic CHF -Stop fluids today, encourage p.o. intake  Depression & anxiety -Psychiatry to see  Type 2 diabetes mellitus -Diet-controlled, A1c 7.3, continue sliding scale for now  Hyponatremia -Resolved  Moisture associated skin damage and intertriginous dermatitis -Appreciate wound care help. IV Diflucan 2 doses. Dressings as per wound care.  Hypertension -Continue hydralazine, currently controlled   DVT prophylaxis: heparin Code Status: Full code Family Communication: No family at bedside Disposition Plan: Home versus SNF when mental status improves. Currently patient with impulsive and unpredictable behavior  Consultants:   None  Procedures:   None   Antimicrobials:  Ceftriaxone 8/19 >>  Subjective: -no complaints. Alert to self, location, states year is 1916. When asked again he tells me he is "playing games". No abdominal pain, nausea or vomiting  Objective: Vitals:   04/29/17 0544 04/29/17 0828 04/29/17 1049 04/29/17 1106  BP: 127/72  122/84   Pulse: 72 85  84  Resp: '20 19  19  '$ Temp: 98.4 F (36.9 C)     TempSrc: Oral     SpO2: 96% 97%  96%  Weight:      Height:        Intake/Output Summary (Last 24 hours) at 04/29/17 1352 Last data filed at 04/29/17 1100  Gross per 24 hour  Intake          1536.67 ml  Output             1450 ml  Net            86.67 ml   Filed Weights   04/25/17 0235 04/26/17 0554 04/28/17 0455  Weight: 133.2 kg (293 lb 10.4 oz) 131.5 kg (289 lb 14.5 oz) 124.5 kg (274 lb 8 oz)     Examination:  Vitals:   04/29/17 0544 04/29/17 0828 04/29/17 1049 04/29/17 1106  BP: 127/72  122/84   Pulse: 72 85  84  Resp: '20 19  19  '$ Temp: 98.4 F (36.9 C)     TempSrc: Oral     SpO2: 96% 97%  96%  Weight:      Height:        Constitutional: NAD Eyes: lids and conjunctivae normal  Respiratory: CTA biL, no wheezing, no crackles. Trach in place Cardiovascular: RRR without MRG. No edema. Ortho boots in place Abdomen: no tenderness. BS + Neurologic: non focal    Data Reviewed: I have independently reviewed following labs and imaging studies   CBC:  Recent Labs Lab 04/24/17 2059 04/25/17 0500 04/26/17 0421 04/27/17 0404 04/28/17 0513  WBC 18.2* 18.9* 11.1* 12.7* 8.9  NEUTROABS 15.5* 15.6*  --   --   --   HGB 11.6* 11.3* 10.5* 10.3* 10.7*  HCT 33.8* 33.5* 32.1* 32.1* 33.9*  MCV 79.3 79.4 80.0 82.7 83.5  PLT 498* 475* 432* 432* 322*   Basic Metabolic Panel:  Recent Labs Lab 04/25/17 0500 04/26/17 0421 04/27/17 0404 04/28/17 0513 04/29/17 0402  NA 131* 136 143 146* 145  K 2.7* 3.4* 3.2* 3.5 3.9  CL 87* 98* 104 110 107  CO2 '26 26 27 27 27  '$ GLUCOSE 136* 112* 113* 96 90  BUN 106* 79* 66* 37* 24*  CREATININE 3.23* 2.14* 1.69* 1.30* 1.26*  CALCIUM 9.2 9.0 9.1 9.1 9.1  MG 3.3*  --   --   --   --    GFR: Estimated Creatinine Clearance: 80.6 mL/min (A) (by C-G formula based on SCr of 1.26 mg/dL (H)). Liver Function Tests:  Recent Labs Lab 04/24/17 2059 04/26/17 0421 04/27/17 0404  AST 40 31 32  ALT '23 19 19  '$ ALKPHOS 139* 109 105  BILITOT 1.8* 0.9 1.1  PROT 8.4* 7.2 7.8  ALBUMIN 3.1* 2.7* 2.8*   No results for input(s): LIPASE, AMYLASE in the last 168 hours. No results for input(s): AMMONIA in the last 168 hours. Coagulation Profile: No results for input(s): INR, PROTIME in the last 168 hours. Cardiac Enzymes: No results for input(s): CKTOTAL, CKMB, CKMBINDEX, TROPONINI in the last 168 hours. BNP (last 3 results) No results for input(s):  PROBNP in the last 8760 hours. HbA1C:  Recent Labs  04/26/17 1757  HGBA1C 7.3*   CBG:  Recent Labs Lab 04/28/17 1203 04/28/17 1632 04/28/17 2151 04/29/17 0758 04/29/17 1155  GLUCAP 99 110* 100* 93 123*   Lipid Profile: No results for input(s): CHOL, HDL, LDLCALC, TRIG, CHOLHDL, LDLDIRECT in the last 72 hours. Thyroid Function Tests: No results for input(s): TSH, T4TOTAL, FREET4, T3FREE, THYROIDAB in the last 72 hours. Anemia Panel: No results for input(s): VITAMINB12, FOLATE, FERRITIN, TIBC, IRON, RETICCTPCT in the last 72 hours. Urine analysis:    Component Value Date/Time   COLORURINE YELLOW 04/24/2017 2001   APPEARANCEUR CLOUDY (A) 04/24/2017 2001   LABSPEC 1.009 04/24/2017 2001  PHURINE 8.0 04/24/2017 2001   GLUCOSEU NEGATIVE 04/24/2017 2001   HGBUR LARGE (A) 04/24/2017 2001   BILIRUBINUR NEGATIVE 04/24/2017 2001   [place] 04/24/2017 2001   PROTEINUR 100 (A) 04/24/2017 2001   UROBILINOGEN 1.0 06/17/2015 1407   NITRITE POSITIVE (A) 04/24/2017 2001   LEUKOCYTESUR LARGE (A) 04/24/2017 2001   Sepsis Labs: Invalid input(s): PROCALCITONIN, LACTICIDVEN  Recent Results (from the past 240 hour(s))  Culture, Urine     Status: Abnormal   Collection Time: 04/25/17  3:30 AM  Result Value Ref Range Status   Specimen Description URINE, RANDOM  Final   Special Requests NONE  Final   Culture >=100,000 COLONIES/mL MORGANELLA MORGANII (A)  Final   Report Status 04/27/2017 FINAL  Final   Organism ID, Bacteria MORGANELLA MORGANII (A)  Final      Susceptibility   Morganella morganii - MIC*    AMPICILLIN >=32 RESISTANT Resistant     CEFAZOLIN >=64 RESISTANT Resistant     CEFTRIAXONE 2 SENSITIVE Sensitive     CIPROFLOXACIN 1 SENSITIVE Sensitive     GENTAMICIN <=1 SENSITIVE Sensitive     IMIPENEM 1 SENSITIVE Sensitive     NITROFURANTOIN 128 RESISTANT Resistant     TRIMETH/SULFA >=320 RESISTANT Resistant     AMPICILLIN/SULBACTAM >=32 RESISTANT Resistant      PIP/TAZO <=4 SENSITIVE Sensitive     * >=100,000 COLONIES/mL MORGANELLA MORGANII      Radiology Studies: No results found.   Scheduled Meds: . chlorhexidine  15 mL Mouth Rinse BID  . docusate sodium  100 mg Oral BID  . fluticasone  2 spray Each Nare Daily  . Gerhardt's butt cream   Topical BID  . heparin subcutaneous  5,000 Units Subcutaneous Q8H  . hydrALAZINE  37.5 mg Oral BID  . hydrocerin   Topical BID  . insulin aspart  0-5 Units Subcutaneous QHS  . insulin aspart  0-9 Units Subcutaneous TID WC  . mouth rinse  15 mL Mouth Rinse q12n4p  . sodium chloride flush  3 mL Intravenous Q12H   Continuous Infusions: . sodium chloride 50 mL/hr at 04/29/17 0939  . cefTRIAXone (ROCEPHIN)  IV Stopped (04/28/17 2242)    Marzetta Board, MD, PhD [facility] Pager [phone]  If 7PM-7AM, please contact night-coverage [email or web address] Password [email or web address] 04/29/2017, 1:52 PM

## 2017-04-29 NOTE — Plan of Care (Signed)
Patient asked again to please let us clean him up from BM. Patient insistently stated, " I'm not letting you guys clean me up and tear my skin." RN and NA educated patient and  apologized about it hurting earlier this morning when he was cleaned up - However, unfortunately, the open, pre-existing sores are going to hurt when cleaned.  Still he needed to be cleaned to prevent further skin breakdown and possible infection.  Patient still refused.

## 2017-04-29 NOTE — Plan of Care (Signed)
Patient refusing to allow staff to help him with changing positions in the bed.

## 2017-04-29 NOTE — Plan of Care (Signed)
Linens brought to bedside and informed patient we needed to change his bed.  Patient stated, " you're not gonna touch me. I'm doing just fine!" Staff will continue to try.

## 2017-04-29 NOTE — Plan of Care (Signed)
Per [facility] and RN assessment, patient is A/O x4.  Patient is able to repeat back decisions and consequences - yet still is refusing care at his time.

## 2017-04-29 NOTE — Plan of Care (Addendum)
Patient refusing to allow staff to re-position him. He has had a BM and it's visibly soiled the entire bed.  Patient states, " no one is moving me and I'm not rolling.  I'm doing just fine!" NA currently in place to temporarily safety sit till 1500.

## 2017-04-29 NOTE — Progress Notes (Signed)
RT called to room to assess patient with trach. Patient was not in distress upon arrival. RT explained to patient the importance of maintaining trach and not pulling or attempting to remove. Patient understood and was encouraged not to mess with trach. Janina Mayo ties replaced. Patient resting at this time.

## 2017-04-29 NOTE — Plan of Care (Signed)
Attempted to clean patient up from [facility] and patient, once again refused to have anyone help.  Patient stated, "noone's gonna touch me again." [facility] contacted to attempt to help with situation and stated she would round when able.

## 2017-04-29 NOTE — Progress Notes (Signed)
Called by pts. RN to come evaluate his c/o trouble breathing, upon arrival noted pt. had continuous pulse oximeter probe removed-replaced with 98% saturations read while on room air with n/c 2 lpm humidified oxygen removed from face, replaced both n/c and PMV which was found on counter after pt. removed from previously attached connection, b/l b.s. clear/diminished, mild work of breathing observed with pt., suctioned Trach for scant amount thick tan secretions, replaced pulse oximeter probe just prior to leaving room, which pt. removed promptly, automated sitter remains in room with RN made aware of pts. condition.

## 2017-04-29 NOTE — Plan of Care (Addendum)
Patient still refusing care and will not allow [place] to clean him up from BM. Bed remains soiled and unable to be changed d/t patient refusing to turn. Dr. Eather Colas. Patient fall mats in place and bed in lowest position. Tele sitter active, IV patent and guaze wrapped. Bed alarm on. Prevalon boots x2 in place. Will pass report to 3rd shift.

## 2017-04-29 NOTE — Plan of Care (Signed)
MRI informed me (RN) this AM, they would be closing department at 1400, d/t staffing issues.  Since MRI was not completed by that time, looks like patient will probably have to wait till 8/24 to have it completed.  Now, STAT MRI order has been placed. MRI phoned, to confirm department was closed and received no answer.  Dr. Informed of situation via text.

## 2017-04-29 NOTE — Progress Notes (Signed)
While providing pt care and adjusting pt's IV tubing, the pt grabbed my groin area and stated "I like the way that feels." I advised the pt that his behavior was inappropriate and that a second incident would be reported. This event occurred between 1000-1100.   Later this morning, while dressing the pt's IV site to prevent him from pulling it out, the pt grabbed my breast. I addressed the pt and let him know that I would tell my instructor about the inappropriate activity. I made the RN aware of the incident as well. This event occurred between 1100-1200.  I informed the instructor Venetia Constable) about the occurrences and was relieved from his care. The assistant director was notified and instructed me to submit a safety zone.

## 2017-04-29 NOTE — Plan of Care (Signed)
Tele sitter contacted RN to inform patient pulling on Trach tube.  When arrived in room. Tube strap had been removed and Trach was half out.  Patient coughing made it difficult to reposition trach.  Respiratory contacted needed STAT in room. Respiratory arrived and assisted in stabilzing patient. Patient stated, " Don't worry about it - Once it's gone, it's gone." Safety sitter placed for now.

## 2017-04-30 DIAGNOSIS — R4189 Other symptoms and signs involving cognitive functions and awareness: Secondary | ICD-10-CM

## 2017-04-30 DIAGNOSIS — Z87891 Personal history of nicotine dependence: Secondary | ICD-10-CM

## 2017-04-30 DIAGNOSIS — R627 Adult failure to thrive: Secondary | ICD-10-CM

## 2017-04-30 DIAGNOSIS — F4320 Adjustment disorder, unspecified: Secondary | ICD-10-CM

## 2017-04-30 LAB — GLUCOSE, CAPILLARY
GLUCOSE-CAPILLARY: 105 mg/dL — AB (ref 65–99)
Glucose-Capillary: 105 mg/dL — ABNORMAL HIGH (ref 65–99)
Glucose-Capillary: 115 mg/dL — ABNORMAL HIGH (ref 65–99)
Glucose-Capillary: 103 mg/dL — ABNORMAL HIGH (ref 65–99)

## 2017-04-30 MED ORDER — PREMIER PROTEIN SHAKE
2.0000 [oz_av] | Freq: Two times a day (BID) | ORAL | Status: DC
  Administered 2017-05-01 – 2017-05-04 (×4): 2 [oz_av] via ORAL
  Filled 2017-04-30 (×11): qty 325.31

## 2017-04-30 NOTE — Consult Note (Signed)
[place] [facility] Consult   Reason for Consult:  Capacity evaluation Referring Physician:  Dr. Cruzita Lederer Patient Identification: [name] MRN:  [id number] Principal Diagnosis: AKI (acute kidney injury) [facility]) Diagnosis:   Patient Active Problem List   Diagnosis Date Noted  . Obesity, Class III, BMI 40-49.9 (morbid obesity) ([place]) [E66.01]   . Hyponatremia [E87.1] 04/24/2017  . Failure to thrive in adult [R62.7] 04/24/2017  . 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 Venous ulcer of right leg ([place]) [I83.019, L97.919] 08/10/2016  . 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 Granulation tissue [L92.9]   . OSA (obstructive sleep apnea) [G47.33]   . Dyslipidemia associated with type 2 diabetes mellitus ([place]) [E11.69, E78.5] 04/20/2016  . DM (diabetes mellitus) type II controlled with renal manifestation ([place]) [E11.29] 04/20/2016  . Tremor of both hands [R25.1] 02/29/2016  . GERD without esophagitis [K21.9] 02/29/2016  . Chronic renal failure, stage 2 (mild) [N18.2] 02/17/2016  . Presence of IVC filter [Z95.828] 01/06/2016  . Hypertensive heart disease with CHF (congestive heart failure) ([place]) [I11.0] 12/27/2015  . Depression with anxiety [F41.8] 09/02/2015  . Leukocytosis [D72.829] 06/25/2015  . Chronic diastolic congestive heart failure ([place]) [I50.32] 04/22/2015  . Hypokalemia [E87.6] 04/22/2015  . Chronic respiratory failure with hypoxia (HCC) [J96.11]   . Tracheostomy status ([place]) [Z93.0]   . Obesity hypoventilation syndrome ([place]) [E66.2] 01/20/2015  . Chronic deep vein thrombosis (DVT) (HCC) [I82.509]   . AKI (acute kidney injury) ([place]) [N17.9] 01/06/2015  . BMI 50.0-59.9, adult ([place]) [Z68.43] 11/29/2014  . Tobacco abuse [Z72.0] 11/29/2014    Total Time spent with patient: 1 hour  Subjective:   [name] is a 66 y.o. male patient admitted with Worsening weakness and AKI.  HPI:  [name] is a 66 years old male admitted to [facility] with multiple  medical problems including acute kidney injury. Patient with past medical history significant for chronic respiratory failure with active tracheostomy, chronic diastolic CHF and COPD. psychiatric consultation was requested for capacity evaluation as patient seems to be having difficulty to understand and seems to be confused since admitted to the hospital. Reportedly patient is also refusing general physical care. Patient was able to tell me his first name, last name and known being in hospital and has a tracheostomy tube for some time. Patient was not able to orient himself to the place, person and situation. Patient is not oriented to day, date, time and year. Patient has immediate memory 3 out of 3 recall memory 1 out of 3 indicate has suffering with memory deficits. Patient has fine fund of knowledge and his abstract thinking is limited to concrete, could not interpret simple saying like don't judge a book by its cover and don't cry over spilled milk. Patient acknowledges these sayings but having difficulty to understand and interpret. Patient has no knowledge about his multiple medical conditions and required treatment needs. Patient was not able to care for himself at least for the last 2 weeks and his roommate is not able to help him. Patient reported has no family members alive.    Past Psychiatric History: none reported.  Risk to Self: Is patient at risk for suicide?: No Risk to Others:   Prior Inpatient Therapy:   Prior Outpatient Therapy:    Past Medical History:  Past Medical History:  Diagnosis Date  . Arthritis   . CHF (congestive heart failure) ([place])   . COPD (chronic obstructive pulmonary disease) ([place])   . Hyperlipidemia   . Hypertension   .  Morbid obesity ([place])   . Multiple abrasions    rt arm  . RCT (rotator cuff tear)     Past Surgical History:  Procedure Laterality Date  . 25819    . NO PAST SURGERIES    . SHOULDER ARTHROSCOPY WITH SUBACROMIAL DECOMPRESSION, ROTATOR  CUFF REPAIR AND BICEP TENDON REPAIR Right 12/28/2014   Procedure: RIGHT SHOULDER ARTHROSCOPY WITH SUBACROMIAL DECOMPRESSION, DISTAL CLAVICLE RESECTION, ROTATOR CUFF REPAIR ;  Surgeon: Sydnee Cabal, MD;  Location: [facility];  Service: Orthopedics;  Laterality: Right;  . TRACHEOSTOMY REVISION N/A 01/18/2015   Procedure: TRACHEOSTOMY REVISION;  Surgeon: Ruby Cola, MD;  Location: [place];  Service: ENT;  Laterality: N/A;  . TRACHEOSTOMY TUBE PLACEMENT N/A 01/16/2015   Procedure: TRACHEOSTOMY;  Surgeon: Jodi Marble, MD;  Location: [place];  Service: ENT;  Laterality: N/A;  . TRACHEOSTOMY TUBE PLACEMENT N/A 01/22/2015   Procedure: TRACHEOSTOMY;  Surgeon: Jodi Marble, MD;  [facility];  Service: ENT;  Laterality: N/A;   Family History:  Family History  Problem Relation Age of Onset  . Heart disease Mother   . Heart attack Mother   . Heart disease Brother    Family Psychiatric  History: Unknown Social History:  History  Alcohol Use No     History  Drug Use No    Social History   Social History  . Marital status: Single    Spouse name: N/A  . Number of children: N/A  . Years of education: N/A   Social History Main Topics  . Smoking status: Former Smoker    Packs/day: 0.20    Types: Cigarettes  . Smokeless tobacco: Former Systems developer  . Alcohol use No  . Drug use: No  . Sexual activity: Not Asked   Other Topics Concern  . None   Social History Narrative  . None   Additional Social History:    Allergies:  No Known Allergies  Labs:  Results for orders placed or performed during the hospital encounter of 04/24/17 (from the past 48 hour(s))  Glucose, capillary     Status: Abnormal   Collection Time: 04/28/17  4:32 PM  Result Value Ref Range   Glucose-Capillary 110 (H) 65 - 99 mg/dL  Glucose, capillary     Status: Abnormal   Collection Time: 04/28/17  9:51 PM  Result Value Ref Range   Glucose-Capillary 100 (H) 65 - 99 mg/dL  Basic metabolic panel     Status: Abnormal    Collection Time: 04/29/17  4:02 AM  Result Value Ref Range   Sodium 145 135 - 145 mmol/L   Potassium 3.9 3.5 - 5.1 mmol/L   Chloride 107 101 - 111 mmol/L   CO2 27 22 - 32 mmol/L   Glucose, Bld 90 65 - 99 mg/dL   BUN 24 (H) 6 - 20 mg/dL   Creatinine, Ser 1.26 (H) 0.61 - 1.24 mg/dL   Calcium 9.1 8.9 - 10.3 mg/dL   GFR calc non Af Amer 59 (L) >60 mL/min   GFR calc Af Amer >60 >60 mL/min    Comment: (NOTE) The eGFR has been calculated using the CKD EPI equation. This calculation has not been validated in all clinical situations. eGFR's persistently <60 mL/min signify possible Chronic Kidney Disease.    Anion gap 11 5 - 15  Glucose, capillary     Status: None   Collection Time: 04/29/17  7:58 AM  Result Value Ref Range   Glucose-Capillary 93 65 - 99 mg/dL  Glucose, capillary  Status: Abnormal   Collection Time: 04/29/17 11:55 AM  Result Value Ref Range   Glucose-Capillary 123 (H) 65 - 99 mg/dL  Glucose, capillary     Status: Abnormal   Collection Time: 04/29/17  5:12 PM  Result Value Ref Range   Glucose-Capillary 110 (H) 65 - 99 mg/dL  Glucose, capillary     Status: None   Collection Time: 04/29/17 11:07 PM  Result Value Ref Range   Glucose-Capillary 93 65 - 99 mg/dL  Glucose, capillary     Status: Abnormal   Collection Time: 04/30/17  7:38 AM  Result Value Ref Range   Glucose-Capillary 105 (H) 65 - 99 mg/dL  Glucose, capillary     Status: Abnormal   Collection Time: 04/30/17 12:07 PM  Result Value Ref Range   Glucose-Capillary 105 (H) 65 - 99 mg/dL    [facility] Facility-Administered Medications  Medication Dose Route Frequency Provider Last Rate Last Dose  . acetaminophen (TYLENOL) tablet 650 mg  650 mg Oral Q6H PRN Opyd, Ilene Qua, MD       Or  . acetaminophen (TYLENOL) suppository 650 mg  650 mg Rectal Q6H PRN Opyd, Ilene Qua, MD      . bisacodyl (DULCOLAX) EC tablet 5 mg  5 mg Oral Daily PRN Opyd, Ilene Qua, MD      . cefTRIAXone (ROCEPHIN) 1 g in dextrose 5 % 50 mL  IVPB  1 g Intravenous QHS Opyd, Ilene Qua, MD   Stopped at 04/29/17 2326  . chlorhexidine (PERIDEX) 0.12 % solution 15 mL  15 mL Mouth Rinse BID Opyd, Ilene Qua, MD   15 mL at 04/30/17 1100  . diphenhydrAMINE (BENADRYL) capsule 25 mg  25 mg Oral Q6H PRN Opyd, Ilene Qua, MD   25 mg at 04/29/17 2342  . docusate sodium (COLACE) capsule 100 mg  100 mg Oral BID Opyd, Ilene Qua, MD   100 mg at 04/30/17 1100  . fluticasone (FLONASE) 50 MCG/ACT nasal spray 2 spray  2 spray Each Nare Daily Opyd, Ilene Qua, MD   2 spray at 04/30/17 1000  . Gerhardt's butt cream   Topical BID Annita Brod, MD      . guaiFENesin-dextromethorphan [facility] DM) 100-10 MG/5ML syrup 5 mL  5 mL Oral Q4H PRN Roney Jaffe, MD   5 mL at 04/28/17 0847  . heparin injection 5,000 Units  5,000 Units Subcutaneous Q8H Opyd, Ilene Qua, MD   5,000 Units at 04/30/17 1500  . hydrALAZINE (APRESOLINE) injection 20 mg  20 mg Intravenous Q6H PRN Roney Jaffe, MD      . hydrALAZINE (APRESOLINE) tablet 37.5 mg  37.5 mg Oral BID Opyd, Ilene Qua, MD   37.5 mg at 04/30/17 1100  . hydrocerin (EUCERIN) cream   Topical BID Annita Brod, MD      . HYDROcodone-acetaminophen (NORCO/VICODIN) 5-325 MG per tablet 1-2 tablet  1-2 tablet Oral Q4H PRN Opyd, Ilene Qua, MD   2 tablet at 04/30/17 1232  . insulin aspart (novoLOG) injection 0-5 Units  0-5 Units Subcutaneous QHS Opyd, Timothy S, MD      . insulin aspart (novoLOG) injection 0-9 Units  0-9 Units Subcutaneous TID WC Opyd, Ilene Qua, MD   1 Units at 04/29/17 1218  . ipratropium-albuterol (DUONEB) 0.5-2.5 (3) MG/3ML nebulizer solution 3 mL  3 mL Nebulization Q6H PRN Opyd, Ilene Qua, MD   3 mL at 04/26/17 2329  . MEDLINE mouth rinse  15 mL Mouth Rinse q12n4p Opyd, Ilene Qua, MD  15 mL at 04/30/17 1200  . ondansetron (ZOFRAN) tablet 4 mg  4 mg Oral Q6H PRN Opyd, Ilene Qua, MD       Or  . ondansetron (ZOFRAN) injection 4 mg  4 mg Intravenous Q6H PRN Opyd, Ilene Qua, MD      . protein  supplement (PREMIER PROTEIN) liquid  2 oz Oral BID BM Gherghe, Costin M, MD      . senna-docusate (Senokot-S) tablet 1 tablet  1 tablet Oral QHS PRN Opyd, Ilene Qua, MD      . sodium chloride flush (NS) 0.9 % injection 3 mL  3 mL Intravenous Q12H Opyd, Ilene Qua, MD   3 mL at 04/29/17 2257    Musculoskeletal: Strength & Muscle Tone: decreased Gait & Station: unable to stand Patient leans: N/A  Psychiatric Specialty Exam: Physical Exam as per history and physical   ROS generalized weakness, difficulty to understand his medical problems, refusing treatment needs and physical care.  No Fever-chills, No Headache, No changes with Vision or hearing, reports vertigo No problems swallowing food or Liquids, No Chest pain, Cough or Shortness of Breath, No Abdominal pain, No Nausea or Vommitting, Bowel movements are regular, No Blood in stool or Urine, No dysuria, No new skin rashes or bruises, No new joints pains-aches,  No new weakness, tingling, numbness in any extremity, No recent weight gain or loss, No polyuria, polydypsia or polyphagia,  A full 10 point Review of Systems was done, except as stated above, all other Review of Systems were negative.  Blood pressure 125/77, pulse 75, temperature 98 F (36.7 C), temperature source Oral, resp. rate 19, height '5\' 11"'$  (1.803 m), weight 133.4 kg (294 lb 1.5 oz), SpO2 96 %.Body mass index is 41.02 kg/m.  General Appearance: Disheveled and Guarded  Eye Contact:  Fair  Speech:  Clear and Coherent  Volume:  Decreased  Mood:  Euthymic  Affect:  Appropriate and Congruent  Thought Process:  Disorganized and Irrelevant  Orientation:  Full (Time, Place, and Person)  Thought Content:  Illogical and Rumination  Suicidal Thoughts:  No  Homicidal Thoughts:  No  Memory:  Immediate;   Fair Recent;   Poor Remote;   Good  Judgement:  Impaired  Insight:  Shallow  Psychomotor Activity:  Psychomotor Retardation  Concentration:  Concentration: Fair and  Attention Span: Poor  Recall:  Poor  Fund of Knowledge:  Fair  Language:  Good  Akathisia:  Negative  Handed:  Right  AIMS (if indicated):     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 Patient has a tracheostomy tube.  Based on my evaluation today patient does not meet criteria for capacity to make his own medical decisions are living arrangements. Patient does not have a comprehensive understanding about multiple complicated medical problems and required treatment needs.  Case discussed with the CSW and the requested to obtain DSS custodian as patient has no family members available and who can obtain medical care power of attorney and placement needs.  Disposition: Supportive therapy provided about ongoing stressors.  Ambrose Finland, MD 04/30/2017 4:26 PM

## 2017-04-30 NOTE — Progress Notes (Addendum)
CSW following for disposition. Met with pt today along with psychiatrist/psychiatry CSW. Psychiatry evaluation determined pt does not have capacity to make medical decisions.  No family have been identified as next of kin and pt's roommate has confirmed no family known and roommate/friends not in position to help pt make decisions.  CSW received call back from [place] (number was in pt's chart from past encounter)- states there is no case open for pt presently, and she will be back in office at the first of the week and will look into any known history since apparently DSS was involved in some way in 2016. Advised CSW proceed with making new report.  CSW called [place] DSS and left voicemail with APS report line. Awaiting returned call. Will file report and continue following to assist in pt's case. Updated attending.   Sharren Bridge, MSW, LCSW Clinical Social Work 04/30/2017 [phone]  16:37 Received call back from [place] with APS- took report from [place] and states APS will review and call back with outcome.

## 2017-04-30 NOTE — Progress Notes (Signed)
PROGRESS NOTE  Adrion Divita HYW:[id number] DOB: [birth year] DOA: 04/24/2017 PCP: No primary care provider on file.   LOS: 6 days   Brief Narrative / Interim history:  66 year old male with past mental history of chronic respiratory failure with chronic tracheostomy, chronic diastolic CHF and COPD presented to the emergency room with worsening weakness. Patient states that he had been lying on the couch unable to get up. Patient was brought in and found to have acute kidney injury and in sepsis secondary to UTI with pyuria. Started on IV fluids and antibiotics  Assessment & Plan: Principal Problem:   AKI (acute kidney injury) ([place]) Active Problems:   BMI 50.0-59.9, adult (HCC)   Chronic deep vein thrombosis (DVT) (HCC)   Obesity hypoventilation syndrome (HCC)   Chronic respiratory failure with hypoxia (HCC)   Tracheostomy status (HCC)   Chronic diastolic congestive heart failure (HCC)   Hypokalemia   Depression with anxiety   Chronic renal failure, stage 2 (mild)   DM (diabetes mellitus) type II controlled with renal manifestation (HCC)   OSA (obstructive sleep apnea)   Hyponatremia   Failure to thrive in adult   Obesity, Class III, BMI 40-49.9 (morbid obesity) ([place])   Sepsis due to UTI -He met criteria for sepsis on admission with lactic acid elevation, white count, tachycardia -Sepsis physiology improved, urine cultures were positive for Morganella -mental status improved and now he appears close to baseline  Acute encephalopathy vs underlying dementia -May have some degree of confusiondementia at baseline per his friend -TSH was normal -Have asked psychiatry to evaluate, appreciate input -Currently patient with impulsive and unpredictable behavior willing on his IVs and pulling on his trach tube intermittently.  -He can answer orientation questions for most part but he is intermittently confused so suspect he lacks capacity -SW involved  Acute kidney injury with underlying  chronic kidney disease stage II -Creatinine continues to improve, now stable, recheck in am   Morbid obesity  Obesity hypoventilation syndrome/obstructive sleep apnea with chronic hypoxic and hypercarbic respiratory failure -Respiratory status appears stable, he has tracheostomy in place, however patient attempted to self discontinue the trach 4/85  Chronic diastolic CHF -appears euvolemic  Depression & anxiety -Psychiatry to see  Type 2 diabetes mellitus -Diet-controlled, A1c 7.3, continue sliding scale for now  Hyponatremia -Resolved  Moisture associated skin damage and intertriginous dermatitis -Appreciate wound care help. IV Diflucan 2 doses. Dressings as per wound care.  Hypertension -Continue hydralazine, currently controlled   DVT prophylaxis: heparin Code Status: Full code Family Communication: No family at bedside Disposition Plan: TBD. Capacity eval pending, SW involved  Consultants:   Psychiatry   Procedures:   None   Antimicrobials:  Ceftriaxone 8/19 >>  Subjective: -upset about staff trying to clean him when he soils himself  Objective: Vitals:   04/30/17 0433 04/30/17 0546 04/30/17 1100 04/30/17 1508  BP:  121/66 120/71 125/77  Pulse: 79 67  75  Resp: '18 20  19  '$ Temp:  98.1 F (36.7 C)  98 F (36.7 C)  TempSrc:  Oral  Oral  SpO2: 95% 97%  96%  Weight:  133.4 kg (294 lb 1.5 oz)    Height:        Intake/Output Summary (Last 24 hours) at 04/30/17 1609 Last data filed at 04/30/17 1500  Gross per 24 hour  Intake              530 ml  Output  1875 ml  Net            -1345 ml   Filed Weights   04/26/17 0554 04/28/17 0455 04/30/17 0546  Weight: 131.5 kg (289 lb 14.5 oz) 124.5 kg (274 lb 8 oz) 133.4 kg (294 lb 1.5 oz)    Examination:  Vitals:   04/30/17 0433 04/30/17 0546 04/30/17 1100 04/30/17 1508  BP:  121/66 120/71 125/77  Pulse: 79 67  75  Resp: '18 20  19  '$ Temp:  98.1 F (36.7 C)  98 F (36.7 C)  TempSrc:  Oral   Oral  SpO2: 95% 97%  96%  Weight:  133.4 kg (294 lb 1.5 oz)    Height:        Constitutional: NAD Eyes: no scleral icterus Respiratory: CTA biL Cardiovascular:RRR   Data Reviewed: I have independently reviewed following labs and imaging studies   CBC:  Recent Labs Lab 04/24/17 2059 04/25/17 0500 04/26/17 0421 04/27/17 0404 04/28/17 0513  WBC 18.2* 18.9* 11.1* 12.7* 8.9  NEUTROABS 15.5* 15.6*  --   --   --   HGB 11.6* 11.3* 10.5* 10.3* 10.7*  HCT 33.8* 33.5* 32.1* 32.1* 33.9*  MCV 79.3 79.4 80.0 82.7 83.5  PLT 498* 475* 432* 432* 132*   Basic Metabolic Panel:  Recent Labs Lab 04/25/17 0500 04/26/17 0421 04/27/17 0404 04/28/17 0513 04/29/17 0402  NA 131* 136 143 146* 145  K 2.7* 3.4* 3.2* 3.5 3.9  CL 87* 98* 104 110 107  CO2 '26 26 27 27 27  '$ GLUCOSE 136* 112* 113* 96 90  BUN 106* 79* 66* 37* 24*  CREATININE 3.23* 2.14* 1.69* 1.30* 1.26*  CALCIUM 9.2 9.0 9.1 9.1 9.1  MG 3.3*  --   --   --   --    GFR: Estimated Creatinine Clearance: 83.6 mL/min (A) (by C-G formula based on SCr of 1.26 mg/dL (H)). Liver Function Tests:  Recent Labs Lab 04/24/17 2059 04/26/17 0421 04/27/17 0404  AST 40 31 32  ALT '23 19 19  '$ ALKPHOS 139* 109 105  BILITOT 1.8* 0.9 1.1  PROT 8.4* 7.2 7.8  ALBUMIN 3.1* 2.7* 2.8*   No results for input(s): LIPASE, AMYLASE in the last 168 hours. No results for input(s): AMMONIA in the last 168 hours. Coagulation Profile: No results for input(s): INR, PROTIME in the last 168 hours. Cardiac Enzymes: No results for input(s): CKTOTAL, CKMB, CKMBINDEX, TROPONINI in the last 168 hours. BNP (last 3 results) No results for input(s): PROBNP in the last 8760 hours. HbA1C: No results for input(s): HGBA1C in the last 72 hours. CBG:  Recent Labs Lab 04/29/17 1155 04/29/17 1712 04/29/17 2307 04/30/17 0738 04/30/17 1207  GLUCAP 123* 110* 93 105* 105*   Lipid Profile: No results for input(s): CHOL, HDL, LDLCALC, TRIG, CHOLHDL, LDLDIRECT in  the last 72 hours. Thyroid Function Tests: No results for input(s): TSH, T4TOTAL, FREET4, T3FREE, THYROIDAB in the last 72 hours. Anemia Panel: No results for input(s): VITAMINB12, FOLATE, FERRITIN, TIBC, IRON, RETICCTPCT in the last 72 hours. Urine analysis:    Component Value Date/Time   COLORURINE YELLOW 04/24/2017 2001   APPEARANCEUR CLOUDY (A) 04/24/2017 2001   LABSPEC 1.009 04/24/2017 2001   PHURINE 8.0 04/24/2017 2001   GLUCOSEU NEGATIVE 04/24/2017 2001   HGBUR LARGE (A) 04/24/2017 2001   BILIRUBINUR NEGATIVE 04/24/2017 2001   KETONESUR NEGATIVE 04/24/2017 2001   PROTEINUR 100 (A) 04/24/2017 2001   UROBILINOGEN 1.0 06/17/2015 1407   NITRITE POSITIVE (A) 04/24/2017 2001  LEUKOCYTESUR LARGE (A) 04/24/2017 2001   Sepsis Labs: Invalid input(s): PROCALCITONIN, LACTICIDVEN  Recent Results (from the past 240 hour(s))  Culture, Urine     Status: Abnormal   Collection Time: 04/25/17  3:30 AM  Result Value Ref Range Status   Specimen Description URINE, RANDOM  Final   Special Requests NONE  Final   Culture >=100,000 COLONIES/mL MORGANELLA MORGANII (A)  Final   Report Status 04/27/2017 FINAL  Final   Organism ID, Bacteria MORGANELLA MORGANII (A)  Final      Susceptibility   Morganella morganii - MIC*    AMPICILLIN >=32 RESISTANT Resistant     CEFAZOLIN >=64 RESISTANT Resistant     CEFTRIAXONE 2 SENSITIVE Sensitive     CIPROFLOXACIN 1 SENSITIVE Sensitive     GENTAMICIN <=1 SENSITIVE Sensitive     IMIPENEM 1 SENSITIVE Sensitive     NITROFURANTOIN 128 RESISTANT Resistant     TRIMETH/SULFA >=320 RESISTANT Resistant     AMPICILLIN/SULBACTAM >=32 RESISTANT Resistant     PIP/TAZO <=4 SENSITIVE Sensitive     * >=100,000 COLONIES/mL MORGANELLA MORGANII      Radiology Studies: No results found.   Scheduled Meds: . chlorhexidine  15 mL Mouth Rinse BID  . docusate sodium  100 mg Oral BID  . fluticasone  2 spray Each Nare Daily  . Gerhardt's butt cream   Topical BID  .  heparin subcutaneous  5,000 Units Subcutaneous Q8H  . hydrALAZINE  37.5 mg Oral BID  . hydrocerin   Topical BID  . insulin aspart  0-5 Units Subcutaneous QHS  . insulin aspart  0-9 Units Subcutaneous TID WC  . mouth rinse  15 mL Mouth Rinse q12n4p  . protein supplement shake  2 oz Oral BID BM  . sodium chloride flush  3 mL Intravenous Q12H   Continuous Infusions: . cefTRIAXone (ROCEPHIN)  IV Stopped (04/29/17 2326)    Marzetta Board, MD, PhD [facility] Pager [phone]  If 7PM-7AM, please contact night-coverage [email or web address] Password [email or web address] 04/30/2017, 4:09 PM

## 2017-04-30 NOTE — Progress Notes (Signed)
Initial Nutrition Assessment  DOCUMENTATION CODES:   Morbid obesity  INTERVENTION:   Provide Premier Protein BID, each supplement provides 160kcal and 30g protein.   NUTRITION DIAGNOSIS:   Increased nutrient needs related to wound healing as evidenced by estimated needs.  GOAL:   Patient will meet greater than or equal to 90% of their needs  MONITOR:   PO intake, Supplement acceptance, Labs, Skin  REASON FOR ASSESSMENT:   Low Braden    ASSESSMENT:   Pt with PMH of CHF, COPD, HLD, CKD II, and HTN. Complains of increased weakness over the past few months. Reports over the past two weeks he has been immobile laying on a couch incontinent of stool and urine. Presents this admission with AKI superimposed on CKD II.    Spoke with pt at bedside. Pt seems to be confused at time of visit. Pt reports having great appetite prior to admission,consuming three meals with snacks. Reports no unintentional wt loss. Records indicate pt has gained 30 lb since Feb 2018. Will provide supplementation this stay for increased protein needs.   Nutrition-Focused physical exam completed. Findings are no fat depletion, no muscle depletion, and no edema.   Medications reviewed and include: SSI, IV abx Labs reviewed: BUN 24 (H) Creatinine 1.26 (H)  Diet Order:  Diet heart healthy/carb modified Room service appropriate? Yes; Fluid consistency: Thin  Skin:   (stg III buttocks stg II buttocks)  Last BM:  04/30/17  Height:   Ht Readings from Last 1 Encounters:  04/25/17 5\' 11"  (1.803 m)    Weight:   Wt Readings from Last 1 Encounters:  04/30/17 294 lb 1.5 oz (133.4 kg)    Ideal Body Weight:  78.2 kg  BMI:  Body mass index is 41.02 kg/m.  Estimated Nutritional Needs:   Kcal:  2155 (MSJ)  Protein:  110-120 grams (1.4-1.5 g/kg IBW)  Fluid:  >2.1 L/day  EDUCATION NEEDS:   No education needs identified at this time  Vanessa Kick RD, LDN Clinical Nutrition Pager # -  [phone]

## 2017-04-30 NOTE — Progress Notes (Signed)
Attempt to provide perineal care at the beginning of shift failed . Second attempt  made and patient was told that it is unacceptable to lay in urine and stool for so long.  He  was reassured that we will be gently and ask for his corporation. Patient was non compliance, kicking and  refusing care. Security called and finally  perineal care was provided.

## 2017-04-30 NOTE — Plan of Care (Signed)
Problem: Skin Integrity: Goal: Risk for impaired skin integrity will decrease Outcome: Completed/Met Date Met: 04/30/17 Pt is on air mattress, pt has also been assessed every 2 hours as pt allows for incontinence

## 2017-05-01 LAB — BASIC METABOLIC PANEL
ANION GAP: 11 (ref 5–15)
BUN: 14 mg/dL (ref 6–20)
CO2: 25 mmol/L (ref 22–32)
Calcium: 9.1 mg/dL (ref 8.9–10.3)
Chloride: 102 mmol/L (ref 101–111)
Creatinine, Ser: 1.22 mg/dL (ref 0.61–1.24)
GFR calc Af Amer: >60 mL/min (ref >60)
GFR calc non Af Amer: >60 mL/min (ref >60)
GLUCOSE: 96 mg/dL (ref 65–99)
POTASSIUM: 3.5 mmol/L (ref 3.5–5.1)
Sodium: 138 mmol/L (ref 135–145)

## 2017-05-01 LAB — GLUCOSE, CAPILLARY
GLUCOSE-CAPILLARY: 94 mg/dL (ref 65–99)
GLUCOSE-CAPILLARY: 104 mg/dL — AB (ref 65–99)
Glucose-Capillary: 110 mg/dL — ABNORMAL HIGH (ref 65–99)
Glucose-Capillary: 107 mg/dL — ABNORMAL HIGH (ref 65–99)

## 2017-05-01 LAB — CBC
HEMATOCRIT: 35.8 % — AB (ref 39.0–52.0)
HEMOGLOBIN: 11.3 g/dL — AB (ref 13.0–17.0)
MCH: 26.7 pg (ref 26.0–34.0)
MCHC: 31.6 g/dL (ref 30.0–36.0)
MCV: 84.6 fL (ref 78.0–100.0)
Platelets: 378 10*3/uL (ref 150–400)
RBC: 4.23 MIL/uL (ref 4.22–5.81)
RDW: 19.6 % — ABNORMAL HIGH (ref 11.5–15.5)
WBC: 8.2 10*3/uL (ref 4.0–10.5)

## 2017-05-01 MED ORDER — CIPROFLOXACIN HCL 500 MG PO TABS
500.0000 mg | ORAL_TABLET | Freq: Two times a day (BID) | ORAL | Status: DC
  Administered 2017-05-01 – 2017-05-09 (×16): 500 mg via ORAL
  Filled 2017-05-01 (×17): qty 1

## 2017-05-01 NOTE — Progress Notes (Signed)
Passy muir value is missing.  RN notified RT of it missing.  RT checked room and unable to locate.

## 2017-05-01 NOTE — Progress Notes (Signed)
PROGRESS NOTE  [name] WER:[id number] DOB: 12-Sep-1950 DOA: 04/24/2017 PCP: No primary care provider on file.   LOS: 7 days   Brief Narrative / Interim history:  65 year old male with past mental history of chronic respiratory failure with chronic tracheostomy, chronic diastolic CHF and COPD presented to the emergency room with worsening weakness. Patient states that he had been lying on the couch unable to get up. Patient was brought in and found to have acute kidney injury and in sepsis secondary to UTI with pyuria. Started on IV fluids and antibiotics patient has improved clinically, and he was transitioned to p.o. antibiotics on 8/25.  Has been having intermittent impulsive behavior in the hospital, and occasionally being confused.  Psychiatry was consulted and evaluated patient on 8/24, and patient was deemed incompetent to make medical decisions.  Social work consulted for help with guardianship as patient has no family and no POA  Assessment & Plan: Principal Problem:   AKI (acute kidney injury) ([place]) Active Problems:   BMI 50.0-59.9, adult ([place])   Chronic deep vein thrombosis (DVT) (HCC)   Obesity hypoventilation syndrome (HCC)   Chronic respiratory failure with hypoxia (HCC)   Tracheostomy status (HCC)   Chronic diastolic congestive heart failure (HCC)   Hypokalemia   Depression with anxiety   Chronic renal failure, stage 2 (mild)   DM (diabetes mellitus) type II controlled with renal manifestation (HCC)   OSA (obstructive sleep apnea)   Hyponatremia   Failure to thrive in adult   Obesity, Class III, BMI 40-49.9 (morbid obesity) ([place])   Sepsis due to UTI -He met criteria for sepsis on admission with lactic acid elevation, white count, tachycardia -Sepsis physiology improved, urine cultures were positive for Morganella -mental status improved and now he appears close to baseline.  Acute encephalopathy vs underlying dementia -May have some degree of confusiondementia at  baseline per his friend -TSH was normal -Have asked psychiatry to evaluate, patient incompetent to make medical decisions for himself -Currently patient with impulsive and unpredictable behavior willing on his IVs and pulling on his trach tube intermittently.  -He can answer orientation questions for most part but he is intermittently confused and unable to grasp his medical issues.  Competent to make decisions -SW involved for guardianship assistance  Acute kidney injury with underlying chronic kidney disease stage II -Creatinine continues to improve, now stable, 1.22 this morning.  Essentially stable.    Morbid obesity  Obesity hypoventilation syndrome/obstructive sleep apnea with chronic hypoxic and hypercarbic respiratory failure -Respiratory status appears stable, he has tracheostomy in place, however patient attempted to self discontinue the trach 1/95  Chronic diastolic CHF -appears euvolemic  Type 2 diabetes mellitus -Diet-controlled, A1c 7.3, continue sliding scale for now  Hyponatremia -Resolved  Moisture associated skin damage and intertriginous dermatitis -Appreciate wound care help. IV Diflucan 2 doses. Dressings as per wound care.  Hypertension -Continue hydralazine, currently controlled   DVT prophylaxis: heparin Code Status: Full code Family Communication: No family at bedside Disposition Plan: TBD. Capacity eval pending, SW involved  Consultants:   Psychiatry   Procedures:   None   Antimicrobials:  Ceftriaxone 8/19 >> 8/25  Ciprofloxacin 8/25 >> plan for end date 8/28 (10 total days)  Subjective: -No complaints this morning, trying to "catch that train".  No shortness of breath, no chest pain, no abdominal pain nausea or vomiting.  Objective: Vitals:   05/01/17 0059 05/01/17 0311 05/01/17 0622 05/01/17 0839  BP:   115/63   Pulse: 68 74 76  Resp: _0 Temp:   98.2 F (36.8 C)   TempSrc:   Oral   SpO2: 98% 97% 95% 96%  Weight:       Height:        Intake/Output Summary (Last 24 hours) at 05/01/17 1127 Last data filed at 05/01/17 1000  Gross per 24 hour  Intake              410 ml  Output              950 ml  Net             -540 ml   Filed Weights   04/26/17 0554 04/28/17 0455 04/30/17 0546  Weight: 131.5 kg (289 lb 14.5 oz) 124.5 kg (274 lb 8 oz) 133.4 kg (294 lb 1.5 oz)    Examination:  Vitals:   05/01/17 0059 05/01/17 0311 05/01/17 0622 05/01/17 0839  BP:   115/63   Pulse: 68 74 76   Resp: _1 Temp:   98.2 F (36.8 C)   TempSrc:   Oral   SpO2: 98% 97% 95% 96%  Weight:      Height:        Constitutional: NAD Respiratory: CTA Cardiovascular:RRR   Data Reviewed: I have independently reviewed following labs and imaging studies   CBC:  Recent Labs Lab 04/24/17 2059 04/25/17 0500 04/26/17 0421 04/27/17 0404 04/28/17 0513 05/01/17 0346  WBC 18.2* 18.9* 11.1* 12.7* 8.9 8.2  NEUTROABS 15.5* 15.6*  --   --   --   --   HGB 11.6* 11.3* 10.5* 10.3* 10.7* 11.3*  HCT 33.8* 33.5* 32.1* 32.1* 33.9* 35.8*  MCV 79.3 79.4 80.0 82.7 83.5 84.6  PLT 498* 475* 432* 432* 414* 623   Basic Metabolic Panel:  Recent Labs Lab 04/25/17 0500 04/26/17 0421 04/27/17 0404 04/28/17 0513 04/29/17 0402 05/01/17 0346  NA 131* 136 143 146* 145 138  K 2.7* 3.4* 3.2* 3.5 3.9 3.5  CL 87* 98* 104 110 107 102  CO2 _2 GLUCOSE 136* 112* 113* 96 90 96  BUN 106* 79* 66* 37* 24* 14  CREATININE 3.23* 2.14* 1.69* 1.30* 1.26* 1.22  CALCIUM 9.2 9.0 9.1 9.1 9.1 9.1  MG 3.3*  --   --   --   --   --    GFR: Estimated Creatinine Clearance: 86.3 mL/min (by C-G formula based on SCr of 1.22 mg/dL). Liver Function Tests:  Recent Labs Lab 04/24/17 2059 04/26/17 0421 04/27/17 0404  AST 40 31 32  ALT _3 ALKPHOS 139* 109 105  BILITOT 1.8* 0.9 1.1  PROT 8.4* 7.2 7.8  ALBUMIN 3.1* 2.7* 2.8*   No results for input(s): LIPASE, AMYLASE in the last 168 hours. No results for input(s):  AMMONIA in the last 168 hours. Coagulation Profile: No results for input(s): INR, PROTIME in the last 168 hours. Cardiac Enzymes: No results for input(s): CKTOTAL, CKMB, CKMBINDEX, TROPONINI in the last 168 hours. BNP (last 3 results) No results for input(s): PROBNP in the last 8760 hours. HbA1C: No results for input(s): HGBA1C in the last 72 hours. CBG:  Recent Labs Lab 04/29/17 2307 04/30/17 0738 04/30/17 1207 04/30/17 1702 04/30/17 2127  GLUCAP 93 105* 105* 115* 103*   Lipid Profile: No results for input(s): CHOL, HDL, LDLCALC, TRIG, CHOLHDL, LDLDIRECT in the last 72 hours. Thyroid Function Tests: No results for input(s): TSH, T4TOTAL, FREET4, T3FREE, THYROIDAB in  the last 72 hours. Anemia Panel: No results for input(s): VITAMINB12, FOLATE, FERRITIN, TIBC, IRON, RETICCTPCT in the last 72 hours. Urine analysis:    Component Value Date/Time   COLORURINE YELLOW 04/24/2017 2001   APPEARANCEUR CLOUDY (A) 04/24/2017 2001   LABSPEC 1.009 04/24/2017 2001   PHURINE 8.0 04/24/2017 2001   GLUCOSEU NEGATIVE 04/24/2017 2001   HGBUR LARGE (A) 04/24/2017 2001   BILIRUBINUR NEGATIVE 04/24/2017 2001   [place] 04/24/2017 2001   PROTEINUR 100 (A) 04/24/2017 2001   UROBILINOGEN 1.0 06/17/2015 1407   NITRITE POSITIVE (A) 04/24/2017 2001   LEUKOCYTESUR LARGE (A) 04/24/2017 2001   Sepsis Labs: Invalid input(s): PROCALCITONIN, LACTICIDVEN  Recent Results (from the past 240 hour(s))  Culture, Urine     Status: Abnormal   Collection Time: 04/25/17  3:30 AM  Result Value Ref Range Status   Specimen Description URINE, RANDOM  Final   Special Requests NONE  Final   Culture >=100,000 COLONIES/mL MORGANELLA MORGANII (A)  Final   Report Status 04/27/2017 FINAL  Final   Organism ID, Bacteria MORGANELLA MORGANII (A)  Final      Susceptibility   Morganella morganii - MIC*    AMPICILLIN >=32 RESISTANT Resistant     CEFAZOLIN >=64 RESISTANT Resistant     CEFTRIAXONE 2 SENSITIVE  Sensitive     CIPROFLOXACIN 1 SENSITIVE Sensitive     GENTAMICIN <=1 SENSITIVE Sensitive     IMIPENEM 1 SENSITIVE Sensitive     NITROFURANTOIN 128 RESISTANT Resistant     TRIMETH/SULFA >=320 RESISTANT Resistant     AMPICILLIN/SULBACTAM >=32 RESISTANT Resistant     PIP/TAZO <=4 SENSITIVE Sensitive     * >=100,000 COLONIES/mL MORGANELLA MORGANII      Radiology Studies: No results found.   Scheduled Meds: . chlorhexidine  15 mL Mouth Rinse BID  . ciprofloxacin  500 mg Oral BID  . docusate sodium  100 mg Oral BID  . fluticasone  2 spray Each Nare Daily  . Gerhardt's butt cream   Topical BID  . heparin subcutaneous  5,000 Units Subcutaneous Q8H  . hydrALAZINE  37.5 mg Oral BID  . hydrocerin   Topical BID  . insulin aspart  0-5 Units Subcutaneous QHS  . insulin aspart  0-9 Units Subcutaneous TID WC  . mouth rinse  15 mL Mouth Rinse q12n4p  . protein supplement shake  2 oz Oral BID BM  . sodium chloride flush  3 mL Intravenous Q12H   Continuous Infusions:   Marzetta Board, MD, PhD [facility] Pager [phone]  If 7PM-7AM, please contact night-coverage [email or web address] Password [facility] 05/01/2017, 11:27 AM

## 2017-05-02 LAB — GLUCOSE, CAPILLARY
GLUCOSE-CAPILLARY: 113 mg/dL — AB (ref 65–99)
GLUCOSE-CAPILLARY: 98 mg/dL (ref 65–99)
Glucose-Capillary: 113 mg/dL — ABNORMAL HIGH (ref 65–99)

## 2017-05-02 MED ORDER — GUAIFENESIN-DM 100-10 MG/5ML PO SYRP: 5.0000 mL | ORAL_SOLUTION | ORAL | Status: DC | PRN

## 2017-05-02 NOTE — Progress Notes (Signed)
Pt refusing all evening medications including his heparin shot. This RN educated pt on the importance of his medications. On call Blount made aware. Pt also becoming increasingly agitated and trying to get out of the bed. This RN explained to pt PT needs to work with him for his safety if he would like to get out of bed d/t pt very weak and has not gotten out of bed and walked last in over 2 weeks per pt. Pt stated to this RN that "I would like to grab my hands around your neck if I could". This RN tried diversional activities and seems to help only temporarily. On call Blount notified of this and made aware. Will continue to monitor pt closely. Tele sitter is present in pt's room. Pt's door open and bed alarm on.

## 2017-05-02 NOTE — Progress Notes (Signed)
Pt refusing to wear continuous pulse ox monitor. Spot check Spo2 and HR was 96% and 74.

## 2017-05-02 NOTE — Progress Notes (Signed)
PROGRESS NOTE  [name] JAS:[id number] DOB: December 28, 1950 DOA: 04/24/2017 PCP: No primary care provider on file.   LOS: 8 days   Brief Narrative / Interim history:  67 year old male with past mental history of chronic respiratory failure with chronic tracheostomy, chronic diastolic CHF and COPD presented to the emergency room with worsening weakness. Patient states that he had been lying on the couch unable to get up. Patient was brought in and found to have acute kidney injury and in sepsis secondary to UTI with pyuria. Started on IV fluids and antibiotics patient has improved clinically, and he was transitioned to p.o. antibiotics on 8/25.  Has been having intermittent impulsive behavior in the hospital, and occasionally being confused.  Psychiatry was consulted and evaluated patient on 8/24, and patient was deemed incompetent to make medical decisions.  Social work consulted for help with guardianship as patient has no family and no POA  Assessment & Plan: Principal Problem:   AKI (acute kidney injury) ([place]) Active Problems:   BMI 50.0-59.9, adult ([place])   Chronic deep vein thrombosis (DVT) (HCC)   Obesity hypoventilation syndrome (HCC)   Chronic respiratory failure with hypoxia (HCC)   Tracheostomy status (HCC)   Chronic diastolic congestive heart failure (HCC)   Hypokalemia   Depression with anxiety   Chronic renal failure, stage 2 (mild)   DM (diabetes mellitus) type II controlled with renal manifestation (HCC)   OSA (obstructive sleep apnea)   Hyponatremia   Failure to thrive in adult   Obesity, Class III, BMI 40-49.9 (morbid obesity) ([place])   Sepsis due to UTI -He met criteria for sepsis on admission with lactic acid elevation, white count, tachycardia -Sepsis physiology improved, urine cultures were positive for Morganella -mental status improved and now he appears close to baseline.  Acute encephalopathy vs underlying dementia -May have some degree of confusiondementia at  baseline per his friend -TSH was normal -Have asked psychiatry to evaluate, patient incompetent to make medical decisions for himself -Currently patient with impulsive and unpredictable behavior willing on his IVs and pulling on his trach tube intermittently.  -He can answer orientation questions for most part but he is intermittently confused and unable to grasp his medical issues.  Competent to make decisions -SW involved for guardianship assistance  Acute kidney injury with underlying chronic kidney disease stage II -Creatinine now stable  Morbid obesity  Obesity hypoventilation syndrome/obstructive sleep apnea with chronic hypoxic and hypercarbic respiratory failure -Respiratory status appears stable, he has tracheostomy in place, however patient attempted to self discontinue the trach 4/19  Chronic diastolic CHF -appears euvolemic  Type 2 diabetes mellitus -Diet-controlled, A1c 7.3, continue sliding scale for now  Hyponatremia -Resolved  Moisture associated skin damage and intertriginous dermatitis -Appreciate wound care help. IV Diflucan 2 doses. Dressings as per wound care.  Hypertension -Continue hydralazine, currently controlled   DVT prophylaxis: heparin Code Status: Full code Family Communication: No family at bedside Disposition Plan: TBD. SW involved for guardianship  Consultants:   Psychiatry   Procedures:   None   Antimicrobials:  Ceftriaxone 8/19 >> 8/25  Ciprofloxacin 8/25 >> plan for end date 8/28 (10 total days)  Subjective: -no complaints. Trying to call a friend  Objective: Vitals:   05/02/17 0321 05/02/17 0622 05/02/17 0826 05/02/17 1137  BP:  113/61    Pulse: 77 78 74 74  Resp: _0 Temp:  98 F (36.7 C)    TempSrc:  Oral    SpO2: 96% 95% 96% 97%  Weight:  131.8 kg (290 lb 9.1 oz)    Height:        Intake/Output Summary (Last 24 hours) at 05/02/17 1409 Last data filed at 05/02/17 1156  Gross per 24 hour  Intake               240 ml  Output              650 ml  Net             -410 ml   Filed Weights   04/28/17 0455 04/30/17 0546 05/02/17 0622  Weight: 124.5 kg (274 lb 8 oz) 133.4 kg (294 lb 1.5 oz) 131.8 kg (290 lb 9.1 oz)    Examination:  Vitals:   05/02/17 0321 05/02/17 0622 05/02/17 0826 05/02/17 1137  BP:  113/61    Pulse: 77 78 74 74  Resp: _0 Temp:  98 F (36.7 C)    TempSrc:  Oral    SpO2: 96% 95% 96% 97%  Weight:  131.8 kg (290 lb 9.1 oz)    Height:        Constitutional: NAD Respiratory: CTA Cardiovascular:RRR   Data Reviewed: I have independently reviewed following labs and imaging studies   CBC:  Recent Labs Lab 04/26/17 0421 04/27/17 0404 04/28/17 0513 05/01/17 0346  WBC 11.1* 12.7* 8.9 8.2  HGB 10.5* 10.3* 10.7* 11.3*  HCT 32.1* 32.1* 33.9* 35.8*  MCV 80.0 82.7 83.5 84.6  PLT 432* 432* 414* 329   Basic Metabolic Panel:  Recent Labs Lab 04/26/17 0421 04/27/17 0404 04/28/17 0513 04/29/17 0402 05/01/17 0346  NA 136 143 146* 145 138  K 3.4* 3.2* 3.5 3.9 3.5  CL 98* 104 110 107 102  CO2 _1 GLUCOSE 112* 113* 96 90 96  BUN 79* 66* 37* 24* 14  CREATININE 2.14* 1.69* 1.30* 1.26* 1.22  CALCIUM 9.0 9.1 9.1 9.1 9.1   GFR: Estimated Creatinine Clearance: 85.8 mL/min (by C-G formula based on SCr of 1.22 mg/dL). Liver Function Tests:  Recent Labs Lab 04/26/17 0421 04/27/17 0404  AST 31 32  ALT 19 19  ALKPHOS 109 105  BILITOT 0.9 1.1  PROT 7.2 7.8  ALBUMIN 2.7* 2.8*   No results for input(s): LIPASE, AMYLASE in the last 168 hours. No results for input(s): AMMONIA in the last 168 hours. Coagulation Profile: No results for input(s): INR, PROTIME in the last 168 hours. Cardiac Enzymes: No results for input(s): CKTOTAL, CKMB, CKMBINDEX, TROPONINI in the last 168 hours. BNP (last 3 results) No results for input(s): PROBNP in the last 8760 hours. HbA1C: No results for input(s): HGBA1C in the last 72 hours. CBG:  Recent  Labs Lab 05/01/17 1134 05/01/17 1704 05/01/17 2213 05/02/17 0730 05/02/17 1153  GLUCAP 110* 107* 104* 113* 98   Lipid Profile: No results for input(s): CHOL, HDL, LDLCALC, TRIG, CHOLHDL, LDLDIRECT in the last 72 hours. Thyroid Function Tests: No results for input(s): TSH, T4TOTAL, FREET4, T3FREE, THYROIDAB in the last 72 hours. Anemia Panel: No results for input(s): VITAMINB12, FOLATE, FERRITIN, TIBC, IRON, RETICCTPCT in the last 72 hours. Urine analysis:    Component Value Date/Time   COLORURINE YELLOW 04/24/2017 2001   APPEARANCEUR CLOUDY (A) 04/24/2017 2001   LABSPEC 1.009 04/24/2017 2001   PHURINE 8.0 04/24/2017 2001   GLUCOSEU NEGATIVE 04/24/2017 2001   HGBUR LARGE (A) 04/24/2017 2001   BILIRUBINUR NEGATIVE 04/24/2017 2001   KETONESUR NEGATIVE 04/24/2017 2001   PROTEINUR 100 (  A) 04/24/2017 2001   UROBILINOGEN 1.0 06/17/2015 1407   NITRITE POSITIVE (A) 04/24/2017 2001   LEUKOCYTESUR LARGE (A) 04/24/2017 2001   Sepsis Labs: Invalid input(s): PROCALCITONIN, LACTICIDVEN  Recent Results (from the past 240 hour(s))  Culture, Urine     Status: Abnormal   Collection Time: 04/25/17  3:30 AM  Result Value Ref Range Status   Specimen Description URINE, RANDOM  Final   Special Requests NONE  Final   Culture >=100,000 COLONIES/mL MORGANELLA MORGANII (A)  Final   Report Status 04/27/2017 FINAL  Final   Organism ID, Bacteria MORGANELLA MORGANII (A)  Final      Susceptibility   Morganella morganii - MIC*    AMPICILLIN >=32 RESISTANT Resistant     CEFAZOLIN >=64 RESISTANT Resistant     CEFTRIAXONE 2 SENSITIVE Sensitive     CIPROFLOXACIN 1 SENSITIVE Sensitive     GENTAMICIN <=1 SENSITIVE Sensitive     IMIPENEM 1 SENSITIVE Sensitive     NITROFURANTOIN 128 RESISTANT Resistant     TRIMETH/SULFA >=320 RESISTANT Resistant     AMPICILLIN/SULBACTAM >=32 RESISTANT Resistant     PIP/TAZO <=4 SENSITIVE Sensitive     * >=100,000 COLONIES/mL MORGANELLA MORGANII      Radiology  Studies: No results found.   Scheduled Meds: . chlorhexidine  15 mL Mouth Rinse BID  . ciprofloxacin  500 mg Oral BID  . docusate sodium  100 mg Oral BID  . fluticasone  2 spray Each Nare Daily  . Gerhardt's butt cream   Topical BID  . heparin subcutaneous  5,000 Units Subcutaneous Q8H  . hydrALAZINE  37.5 mg Oral BID  . hydrocerin   Topical BID  . insulin aspart  0-5 Units Subcutaneous QHS  . insulin aspart  0-9 Units Subcutaneous TID WC  . mouth rinse  15 mL Mouth Rinse q12n4p  . protein supplement shake  2 oz Oral BID BM  . sodium chloride flush  3 mL Intravenous Q12H   Continuous Infusions:   Marzetta Board, MD, PhD [facility] Pager [phone]  If 7PM-7AM, please contact night-coverage [email or web address] Password [email or web address] 05/02/2017, 2:09 PM

## 2017-05-03 LAB — GLUCOSE, CAPILLARY
GLUCOSE-CAPILLARY: 97 mg/dL (ref 65–99)
Glucose-Capillary: 110 mg/dL — ABNORMAL HIGH (ref 65–99)
Glucose-Capillary: 99 mg/dL (ref 65–99)
Glucose-Capillary: 99 mg/dL (ref 65–99)

## 2017-05-03 NOTE — Progress Notes (Signed)
PROGRESS NOTE  [name] PPJ:[id number] DOB: Dec 30, 1950 DOA: 04/24/2017 PCP: No primary care provider on file.   LOS: 9 days   Brief Narrative / Interim history:  66 year old male with past mental history of chronic respiratory failure with chronic tracheostomy, chronic diastolic CHF and COPD presented to the emergency room with worsening weakness. Patient states that he had been lying on the couch unable to get up. Patient was brought in and found to have acute kidney injury and in sepsis secondary to UTI with pyuria. Started on IV fluids and antibiotics patient has improved clinically, and he was transitioned to p.o. antibiotics on 8/25.  Has been having intermittent impulsive behavior in the hospital, and occasionally being confused.  Psychiatry was consulted and evaluated patient on 8/24, and patient was deemed incompetent to make medical decisions.  Social work consulted for help with guardianship as patient has no family and no POA  Assessment & Plan: Principal Problem:   AKI (acute kidney injury) ([place]) Active Problems:   BMI 50.0-59.9, adult ([place])   Chronic deep vein thrombosis (DVT) (HCC)   Obesity hypoventilation syndrome (HCC)   Chronic respiratory failure with hypoxia (HCC)   Tracheostomy status (HCC)   Chronic diastolic congestive heart failure (HCC)   Hypokalemia   Depression with anxiety   Chronic renal failure, stage 2 (mild)   DM (diabetes mellitus) type II controlled with renal manifestation (HCC)   OSA (obstructive sleep apnea)   Hyponatremia   Failure to thrive in adult   Obesity, Class III, BMI 40-49.9 (morbid obesity) ([place])   Sepsis due to UTI -He met criteria for sepsis on admission with lactic acid elevation, white count, tachycardia -Sepsis physiology improved, urine cultures were positive for Morganella -mental status improved and now he appears close to baseline.  Acute encephalopathy vs underlying dementia -May have some degree of confusiondementia at  baseline per his friend -TSH was normal -Have asked psychiatry to evaluate, patient incompetent to make medical decisions for himself -Currently patient with impulsive and unpredictable behavior willing on his IVs and pulling on his trach tube intermittently.  -He can answer orientation questions for most part but he is intermittently confused and unable to grasp his medical issues.  Competent to make decisions -SW involved for guardianship assistance  Acute kidney injury with underlying chronic kidney disease stage II -Creatinine now stable  Morbid obesity  Obesity hypoventilation syndrome/obstructive sleep apnea with chronic hypoxic and hypercarbic respiratory failure -Respiratory status appears stable, he has tracheostomy in place, however patient attempted to self discontinue the trach 5/80  Chronic diastolic CHF -appears euvolemic  Type 2 diabetes mellitus -Diet-controlled, A1c 7.3, continue sliding scale for now  Hyponatremia -Resolved  Moisture associated skin damage and intertriginous dermatitis -Appreciate wound care help. IV Diflucan 2 doses. Dressings as per wound care.  Hypertension -Continue hydralazine, currently controlled   DVT prophylaxis: heparin Code Status: Full code Family Communication: No family at bedside Disposition Plan: TBD. SW involved for guardianship  Consultants:   Psychiatry   Procedures:   None   Antimicrobials:  Ceftriaxone 8/19 >> 8/25  Ciprofloxacin 8/25 >> plan for end date 8/28 (10 total days)  Subjective: -Complains of itching in his back.  No other complaints  Objective: Vitals:   05/03/17 0433 05/03/17 0607 05/03/17 0721 05/03/17 1143  BP:      Pulse: 74  83 80  Resp:   16 16  Temp:      TempSrc:      SpO2: 94%  98% 98%  Weight:  131.5 kg (289 lb 14.5 oz)    Height:  6' (1.829 m)      Intake/Output Summary (Last 24 hours) at 05/03/17 1209 Last data filed at 05/02/17 2200  Gross per 24 hour  Intake               120 ml  Output              450 ml  Net             -330 ml   Filed Weights   04/30/17 0546 05/02/17 0622 05/03/17 0607  Weight: 133.4 kg (294 lb 1.5 oz) 131.8 kg (290 lb 9.1 oz) 131.5 kg (289 lb 14.5 oz)    Examination:  Vitals:   05/03/17 0433 05/03/17 0607 05/03/17 0721 05/03/17 1143  BP:      Pulse: 74  83 80  Resp:   16 16  Temp:      TempSrc:      SpO2: 94%  98% 98%  Weight:  131.5 kg (289 lb 14.5 oz)    Height:  6' (1.829 m)      Constitutional: NAD Respiratory: CTA Cardiovascular: RRR  Data Reviewed: I have independently reviewed following labs and imaging studies   CBC:  Recent Labs Lab 04/27/17 0404 04/28/17 0513 05/01/17 0346  WBC 12.7* 8.9 8.2  HGB 10.3* 10.7* 11.3*  HCT 32.1* 33.9* 35.8*  MCV 82.7 83.5 84.6  PLT 432* 414* 416   Basic Metabolic Panel:  Recent Labs Lab 04/27/17 0404 04/28/17 0513 04/29/17 0402 05/01/17 0346  NA 143 146* 145 138  K 3.2* 3.5 3.9 3.5  CL 104 110 107 102  CO2 '27 27 27 25  '$ GLUCOSE 113* 96 90 96  BUN 66* 37* 24* 14  CREATININE 1.69* 1.30* 1.26* 1.22  CALCIUM 9.1 9.1 9.1 9.1   GFR: Estimated Creatinine Clearance: 87 mL/min (by C-G formula based on SCr of 1.22 mg/dL). Liver Function Tests:  Recent Labs Lab 04/27/17 0404  AST 32  ALT 19  ALKPHOS 105  BILITOT 1.1  PROT 7.8  ALBUMIN 2.8*   No results for input(s): LIPASE, AMYLASE in the last 168 hours. No results for input(s): AMMONIA in the last 168 hours. Coagulation Profile: No results for input(s): INR, PROTIME in the last 168 hours. Cardiac Enzymes: No results for input(s): CKTOTAL, CKMB, CKMBINDEX, TROPONINI in the last 168 hours. BNP (last 3 results) No results for input(s): PROBNP in the last 8760 hours. HbA1C: No results for input(s): HGBA1C in the last 72 hours. CBG:  Recent Labs Lab 05/01/17 2213 05/02/17 0730 05/02/17 1153 05/02/17 1621 05/03/17 0810  GLUCAP 104* 113* 98 113* 110*   Lipid Profile: No results for input(s):  CHOL, HDL, LDLCALC, TRIG, CHOLHDL, LDLDIRECT in the last 72 hours. Thyroid Function Tests: No results for input(s): TSH, T4TOTAL, FREET4, T3FREE, THYROIDAB in the last 72 hours. Anemia Panel: No results for input(s): VITAMINB12, FOLATE, FERRITIN, TIBC, IRON, RETICCTPCT in the last 72 hours. Urine analysis:    Component Value Date/Time   COLORURINE YELLOW 04/24/2017 2001   APPEARANCEUR CLOUDY (A) 04/24/2017 2001   LABSPEC 1.009 04/24/2017 2001   PHURINE 8.0 04/24/2017 2001   GLUCOSEU NEGATIVE 04/24/2017 2001   HGBUR LARGE (A) 04/24/2017 2001   BILIRUBINUR NEGATIVE 04/24/2017 2001   KETONESUR NEGATIVE 04/24/2017 2001   PROTEINUR 100 (A) 04/24/2017 2001   UROBILINOGEN 1.0 06/17/2015 1407   NITRITE POSITIVE (A) 04/24/2017 2001   LEUKOCYTESUR LARGE (A) 04/24/2017 2001  Sepsis Labs: Invalid input(s): PROCALCITONIN, LACTICIDVEN  Recent Results (from the past 240 hour(s))  Culture, Urine     Status: Abnormal   Collection Time: 04/25/17  3:30 AM  Result Value Ref Range Status   Specimen Description URINE, RANDOM  Final   Special Requests NONE  Final   Culture >=100,000 COLONIES/mL MORGANELLA MORGANII (A)  Final   Report Status 04/27/2017 FINAL  Final   Organism ID, Bacteria MORGANELLA MORGANII (A)  Final      Susceptibility   Morganella morganii - MIC*    AMPICILLIN >=32 RESISTANT Resistant     CEFAZOLIN >=64 RESISTANT Resistant     CEFTRIAXONE 2 SENSITIVE Sensitive     CIPROFLOXACIN 1 SENSITIVE Sensitive     GENTAMICIN <=1 SENSITIVE Sensitive     IMIPENEM 1 SENSITIVE Sensitive     NITROFURANTOIN 128 RESISTANT Resistant     TRIMETH/SULFA >=320 RESISTANT Resistant     AMPICILLIN/SULBACTAM >=32 RESISTANT Resistant     PIP/TAZO <=4 SENSITIVE Sensitive     * >=100,000 COLONIES/mL MORGANELLA MORGANII      Radiology Studies: No results found.   Scheduled Meds: . chlorhexidine  15 mL Mouth Rinse BID  . ciprofloxacin  500 mg Oral BID  . docusate sodium  100 mg Oral BID  .  fluticasone  2 spray Each Nare Daily  . Gerhardt's butt cream   Topical BID  . heparin subcutaneous  5,000 Units Subcutaneous Q8H  . hydrALAZINE  37.5 mg Oral BID  . hydrocerin   Topical BID  . insulin aspart  0-5 Units Subcutaneous QHS  . insulin aspart  0-9 Units Subcutaneous TID WC  . mouth rinse  15 mL Mouth Rinse q12n4p  . protein supplement shake  2 oz Oral BID BM  . sodium chloride flush  3 mL Intravenous Q12H   Continuous Infusions:   Marzetta Board, MD, PhD [facility] Pager [phone]  If 7PM-7AM, please contact night-coverage [email or web address] Password [facility] 05/03/2017, 12:09 PM

## 2017-05-03 NOTE — Progress Notes (Signed)
Physical Therapy Treatment Patient Details Name: [name] MRN: [id number] DOB: 24-Apr-1951 Today's Date: 05/03/2017    History of Present Illness  Michel Ticer is a 66 y.o. male with medical history significant for COPD, chronic diastolic CHF, hypertension, morbid obesity, chronic kidney disease stage II, and OSA, into the emergency department for evaluation of generalized weakness--per chart pt has been too weak to get off the couch and has been urinating and defecating on himself for days.  Diagnosed with sepsis due to UTI, acute encephalopathy    PT Comments    Pt tangential during session today.  Spent 5 minutes attempting to have pt assist to sitting upright however he did not initiate this.  Pt able to move Bil LEs well in bed and observed pt perform bridging.  Pt eventually rolled to left side and BM discovered on bed pad.  Pt eventually agreeable to allow PT and RN to assist with hygiene and barrier cream and repositioning.   Follow Up Recommendations  SNF     Equipment Recommendations  None recommended by PT    Recommendations for Other Services       Precautions / Restrictions Precautions Precautions: Fall Precaution Comments: chronic trach with PMV    Mobility  Bed Mobility Overal bed mobility: Needs Assistance Bed Mobility: Rolling Rolling: Min assist         General bed mobility comments: assist mostly to initiate rolling and guiding UEs to rail to self assist, pt eventually agreeable to roll, pt had BM and finally agreeable for PT and RN to assist with hygiene and applying barrier cream  Transfers                 General transfer comment: pt not agreeable  Ambulation/Gait                 Stairs            Wheelchair Mobility    Modified Rankin (Stroke Patients Only)       Balance                                            Cognition Arousal/Alertness: Awake/alert Behavior During Therapy: WFL for tasks  assessed/performed Overall Cognitive Status: Impaired/Different from baseline Area of Impairment: Following commands;Problem solving;Safety/judgement                       Following Commands: Follows one step commands inconsistently Safety/Judgement: Decreased awareness of safety;Decreased awareness of deficits   Problem Solving: Slow processing;Decreased initiation General Comments: very tangential, uncertain of baseline ?underlying dementia      Exercises      General Comments        Pertinent Vitals/Pain Pain Assessment: No/denies pain    Home Living                      Prior Function            PT Goals ([place] goals can now be found in the care plan section) Progress towards PT goals: Progressing toward goals    Frequency    Min 2X/week      PT Plan [place] plan remains appropriate    Co-evaluation              AM-PAC PT "6 Clicks" Daily Activity  Outcome Measure  Difficulty turning over in  bed (including adjusting bedclothes, sheets and blankets)?: Unable Difficulty moving from lying on back to sitting on the side of the bed? : Unable Difficulty sitting down on and standing up from a chair with arms (e.g., wheelchair, bedside commode, etc,.)?: Unable Help needed moving to and from a bed to chair (including a wheelchair)?: Total Help needed walking in hospital room?: Total Help needed climbing 3-5 steps with a railing? : Total 6 Click Score: 6    End of Session Equipment Utilized During Treatment: Gait belt Activity Tolerance: Other (comment) (possibly limited by cognition, flucuating participation) Patient left: with call bell/phone within reach;with bed alarm set;in bed Nurse Communication: Mobility status (RN assisted) PT Visit Diagnosis: Muscle weakness (generalized) (M62.81)     Time: 1610-9604 PT Time Calculation (min) (ACUTE ONLY): 30 min  Charges:  $Therapeutic Activity: 23-37 mins                    G Codes:        Zenovia Jarred, PT, DPT 05/03/2017 Pager: [phone]  Maida Sale E 05/03/2017, 2:42 PM

## 2017-05-03 NOTE — Care Management Note (Signed)
Case Management Note  Patient Details  Name: [name] MRN: [id number] Date of Birth: [birth year]  Subjective/Objective:  66 year old male with past mental history of chronic respiratory failure with chronic tracheostomy, chronic diastolic CHF and COPD.                   Action/Plan: Social work consulted for help with guardianship as patient has no family and no POA. Plan to discharge to SNF.    Expected Discharge Date:   Pt ready for discharge waiting on APS for guardianship.               Expected Discharge Plan:  Skilled Nursing Facility  In-House Referral:  Clinical Social Work  Discharge planning Services  CM Consult  Post Acute Care Choice:    Choice offered to:     DME Arranged:    DME Agency:     HH Arranged:    HH Agency:     Status of Service:  Completed, signed off  If discussed at [employer], dates discussed:    Additional CommentsGeni Bers, RN 05/03/2017, 2:03 PM

## 2017-05-03 NOTE — Progress Notes (Signed)
CSW contacted APS worker Salome Spotted [phone]) in regards to plans about patient's guardianship/discharge. APS worker reported that they are working on a diligent search for patient's family and looking into patient's income. APS worker reported that she was going to staff case with supervisors in regards how to move forward with patient's case and agreed to touch base with CSW in the morning. CSW awaiting return call and will continue to follow and assist patient with discharge planning.   Celso Sickle, [place] Clinical Social Worker [facility] Cell#: [phone]

## 2017-05-04 LAB — GLUCOSE, CAPILLARY
GLUCOSE-CAPILLARY: 104 mg/dL — AB (ref 65–99)
GLUCOSE-CAPILLARY: 91 mg/dL (ref 65–99)
GLUCOSE-CAPILLARY: 91 mg/dL (ref 65–99)
GLUCOSE-CAPILLARY: 135 mg/dL — AB (ref 65–99)

## 2017-05-04 NOTE — Progress Notes (Signed)
PROGRESS NOTE  [name] RSW:[id number] DOB: 13-Dec-1950 DOA: 04/24/2017 PCP: No primary care provider on file.   LOS: 10 days   Brief Narrative / Interim history:  66 year old male with past mental history of chronic respiratory failure with chronic tracheostomy, chronic diastolic CHF and COPD presented to the emergency room with worsening weakness. Patient states that he had been lying on the couch unable to get up. Patient was brought in and found to have acute kidney injury and in sepsis secondary to UTI with pyuria. Started on IV fluids and antibiotics patient has improved clinically, and he was transitioned to p.o. antibiotics on 8/25.  Has been having intermittent impulsive behavior in the hospital, and occasionally being confused.  Psychiatry was consulted and evaluated patient on 8/24, and patient was deemed incompetent to make medical decisions.  Social work consulted for help with guardianship as patient has no family and no POA.  This is her main issue, clinically has been stable for several days   Assessment & Plan: Principal Problem:   AKI (acute kidney injury) ([place]) Active Problems:   BMI 50.0-59.9, adult ([place])   Chronic deep vein thrombosis (DVT) (HCC)   Obesity hypoventilation syndrome (HCC)   Chronic respiratory failure with hypoxia (HCC)   Tracheostomy status (HCC)   Chronic diastolic congestive heart failure (HCC)   Hypokalemia   Depression with anxiety   Chronic renal failure, stage 2 (mild)   DM (diabetes mellitus) type II controlled with renal manifestation (HCC)   OSA (obstructive sleep apnea)   Hyponatremia   Failure to thrive in adult   Obesity, Class III, BMI 40-49.9 (morbid obesity) ([place])   Sepsis due to UTI -He met criteria for sepsis on admission with lactic acid elevation, white count, tachycardia -Sepsis physiology improved, urine cultures were positive for Morganella -mental status improved and now he appears close to baseline.  Acute  encephalopathy vs underlying dementia -May have some degree of confusiondementia at baseline per his friend -TSH was normal -Have asked psychiatry to evaluate, patient incompetent to make medical decisions for himself -Currently patient with impulsive and unpredictable behavior willing on his IVs and pulling on his trach tube intermittently.  -He can answer orientation questions for most part but he is intermittently confused and unable to grasp his medical issues.  Competent to make decisions -SW involved for guardianship assistance  Acute kidney injury with underlying chronic kidney disease stage II -Creatinine now stable  Morbid obesity  Obesity hypoventilation syndrome/obstructive sleep apnea with chronic hypoxic and hypercarbic respiratory failure -Respiratory status appears stable, he has tracheostomy in place, however patient attempted to self discontinue the trach 0/93  Chronic diastolic CHF -appears euvolemic  Type 2 diabetes mellitus -Diet-controlled, A1c 7.3, continue sliding scale for now  Hyponatremia -Resolved  Moisture associated skin damage and intertriginous dermatitis -Appreciate wound care help. IV Diflucan 2 doses. Dressings as per wound care.  Hypertension -Continue hydralazine, currently controlled   DVT prophylaxis: heparin Code Status: Full code Family Communication: No family at bedside Disposition Plan: TBD. SW involved for guardianship  Consultants:   Psychiatry   Procedures:   None   Antimicrobials:  Ceftriaxone 8/19 >> 8/25  Ciprofloxacin 8/25 >> plan for end date 8/28 (10 total days)  Subjective: -no complaints. No chest pain/breathing difficulties  Objective: Vitals:   05/03/17 2127 05/03/17 2356 05/04/17 0315 05/04/17 0637  BP: (!) 143/102   105/62  Pulse: 84 67 66 82  Resp: '20 20 18 20  '$ Temp: 98.2 F (36.8 C)  98.6 F (37 C)  TempSrc: Oral   Oral  SpO2: 100% 97% 96% 99%  Weight:    131 kg (288 lb 12.8 oz)  Height:         Intake/Output Summary (Last 24 hours) at 05/04/17 1127 Last data filed at 05/04/17 0900  Gross per 24 hour  Intake              360 ml  Output              700 ml  Net             -340 ml   Filed Weights   05/02/17 0622 05/03/17 0607 05/04/17 0637  Weight: 131.8 kg (290 lb 9.1 oz) 131.5 kg (289 lb 14.5 oz) 131 kg (288 lb 12.8 oz)    Examination:  Vitals:   05/03/17 2127 05/03/17 2356 05/04/17 0315 05/04/17 0637  BP: (!) 143/102   105/62  Pulse: 84 67 66 82  Resp: '20 20 18 20  '$ Temp: 98.2 F (36.8 C)   98.6 F (37 C)  TempSrc: Oral   Oral  SpO2: 100% 97% 96% 99%  Weight:    131 kg (288 lb 12.8 oz)  Height:        Constitutional: NAD Respiratory: CTA  Cardiovascular: RRR  Data Reviewed: I have independently reviewed following labs and imaging studies   CBC:  Recent Labs Lab 04/28/17 0513 05/01/17 0346  WBC 8.9 8.2  HGB 10.7* 11.3*  HCT 33.9* 35.8*  MCV 83.5 84.6  PLT 414* 875   Basic Metabolic Panel:  Recent Labs Lab 04/28/17 0513 04/29/17 0402 05/01/17 0346  NA 146* 145 138  K 3.5 3.9 3.5  CL 110 107 102  CO2 '27 27 25  '$ GLUCOSE 96 90 96  BUN 37* 24* 14  CREATININE 1.30* 1.26* 1.22  CALCIUM 9.1 9.1 9.1   GFR: Estimated Creatinine Clearance: 86.8 mL/min (by C-G formula based on SCr of 1.22 mg/dL). Liver Function Tests: No results for input(s): AST, ALT, ALKPHOS, BILITOT, PROT, ALBUMIN in the last 168 hours. No results for input(s): LIPASE, AMYLASE in the last 168 hours. No results for input(s): AMMONIA in the last 168 hours. Coagulation Profile: No results for input(s): INR, PROTIME in the last 168 hours. Cardiac Enzymes: No results for input(s): CKTOTAL, CKMB, CKMBINDEX, TROPONINI in the last 168 hours. BNP (last 3 results) No results for input(s): PROBNP in the last 8760 hours. HbA1C: No results for input(s): HGBA1C in the last 72 hours. CBG:  Recent Labs Lab 05/03/17 0810 05/03/17 1215 05/03/17 1740 05/03/17 2234 05/04/17 0748    GLUCAP 110* 99 99 97 104*   Lipid Profile: No results for input(s): CHOL, HDL, LDLCALC, TRIG, CHOLHDL, LDLDIRECT in the last 72 hours. Thyroid Function Tests: No results for input(s): TSH, T4TOTAL, FREET4, T3FREE, THYROIDAB in the last 72 hours. Anemia Panel: No results for input(s): VITAMINB12, FOLATE, FERRITIN, TIBC, IRON, RETICCTPCT in the last 72 hours. Urine analysis:    Component Value Date/Time   COLORURINE YELLOW 04/24/2017 2001   APPEARANCEUR CLOUDY (A) 04/24/2017 2001   LABSPEC 1.009 04/24/2017 2001   PHURINE 8.0 04/24/2017 2001   GLUCOSEU NEGATIVE 04/24/2017 2001   HGBUR LARGE (A) 04/24/2017 2001   BILIRUBINUR NEGATIVE 04/24/2017 2001   [place] 04/24/2017 2001   PROTEINUR 100 (A) 04/24/2017 2001   UROBILINOGEN 1.0 06/17/2015 1407   NITRITE POSITIVE (A) 04/24/2017 2001   LEUKOCYTESUR LARGE (A) 04/24/2017 2001   Sepsis Labs: Invalid input(s): PROCALCITONIN,  LACTICIDVEN  Recent Results (from the past 240 hour(s))  Culture, Urine     Status: Abnormal   Collection Time: 04/25/17  3:30 AM  Result Value Ref Range Status   Specimen Description URINE, RANDOM  Final   Special Requests NONE  Final   Culture >=100,000 COLONIES/mL MORGANELLA MORGANII (A)  Final   Report Status 04/27/2017 FINAL  Final   Organism ID, Bacteria MORGANELLA MORGANII (A)  Final      Susceptibility   Morganella morganii - MIC*    AMPICILLIN >=32 RESISTANT Resistant     CEFAZOLIN >=64 RESISTANT Resistant     CEFTRIAXONE 2 SENSITIVE Sensitive     CIPROFLOXACIN 1 SENSITIVE Sensitive     GENTAMICIN <=1 SENSITIVE Sensitive     IMIPENEM 1 SENSITIVE Sensitive     NITROFURANTOIN 128 RESISTANT Resistant     TRIMETH/SULFA >=320 RESISTANT Resistant     AMPICILLIN/SULBACTAM >=32 RESISTANT Resistant     PIP/TAZO <=4 SENSITIVE Sensitive     * >=100,000 COLONIES/mL MORGANELLA MORGANII      Radiology Studies: No results found.   Scheduled Meds: . chlorhexidine  15 mL Mouth Rinse BID  .  ciprofloxacin  500 mg Oral BID  . docusate sodium  100 mg Oral BID  . fluticasone  2 spray Each Nare Daily  . Gerhardt's butt cream   Topical BID  . heparin subcutaneous  5,000 Units Subcutaneous Q8H  . hydrALAZINE  37.5 mg Oral BID  . hydrocerin   Topical BID  . insulin aspart  0-5 Units Subcutaneous QHS  . insulin aspart  0-9 Units Subcutaneous TID WC  . mouth rinse  15 mL Mouth Rinse q12n4p  . protein supplement shake  2 oz Oral BID BM  . sodium chloride flush  3 mL Intravenous Q12H   Continuous Infusions:   Marzetta Board, MD, PhD [facility] Pager [phone]  If 7PM-7AM, please contact night-coverage [email or web address] Password [facility] 05/04/2017, 11:27 AM

## 2017-05-04 NOTE — Progress Notes (Signed)
CSW contacted by APS worker Salome Spotted [phone]) and provided with update regarding patient's case. She reported that they have not identified any information regarding patient's income and are still looking for family. APS worker requested clinical documents, CSW faxed requested documents. She reported that she would call CSW back with an update after staffing the case with supervisor and reaching out to patient's roommate and a previous SNF. CSW awaiting return call.   Celso Sickle, [place] Clinical Social Worker [facility] Cell#: [phone]

## 2017-05-05 ENCOUNTER — Ambulatory Visit (HOSPITAL_COMMUNITY): Payer: Self-pay

## 2017-05-05 DIAGNOSIS — L8990 Pressure ulcer of unspecified site, unspecified stage: Secondary | ICD-10-CM | POA: Insufficient documentation

## 2017-05-05 LAB — GLUCOSE, CAPILLARY
GLUCOSE-CAPILLARY: 104 mg/dL — AB (ref 65–99)
GLUCOSE-CAPILLARY: 117 mg/dL — AB (ref 65–99)
Glucose-Capillary: 97 mg/dL (ref 65–99)
Glucose-Capillary: 156 mg/dL — ABNORMAL HIGH (ref 65–99)

## 2017-05-05 NOTE — Progress Notes (Signed)
Pt currently has Passy Muir valve via Speech therapist.. Pt without distress. Will cont to monitor. SRP, RN

## 2017-05-05 NOTE — Progress Notes (Signed)
Trach care completed with resp therapist. Tol well, sats in the 90's. Pt alert and oriented. Talking, pt able to communicate with Trach. Additional trach supplies placed at the desk. SRP, RN

## 2017-05-05 NOTE — Progress Notes (Signed)
Spoke with Speech Therapy Misty StanleyLisa, concerning pt needing a [employer] Muir valve. Speech will follow up, will cont to check pt. Pt stable at [name] time. Resp status unlabored and even. Trach open, pt places finger over trach to speak/converse. No acute distress. Coughing occasionally. SRP, RN

## 2017-05-05 NOTE — [place] FL2 (Signed)
[place] MEDICAID FL2 LEVEL OF CARE SCREENING TOOL     IDENTIFICATION  Patient Name: [name] Birthdate: 01/13/1951 Sex: male Admission Date ([place] Location): 04/24/2017  [facility] and [place] Number:  Producer, television/film/video and Address:  [facility],  [address] [place], [place] [address]      Provider Number: [id number]  Attending Physician Name and Address:  Rolly Salter, MD  Relative Name and Phone Number:       [place] Level of Care: Hospital Recommended Level of Care: Skilled Nursing Facility Prior Approval Number:    Date Approved/Denied:   PASRR Number: [id number]  Discharge Plan: SNF    [place] Diagnoses: Patient Active Problem List   Diagnosis Date Noted  . Pressure injury of skin 05/05/2017  . Obesity, Class III, BMI 40-49.9 (morbid obesity) (HCC)   . Hyponatremia 04/24/2017  . Failure to thrive in adult 04/24/2017  . Tracheostomy dependence (HCC)   . Venous ulcer of right leg (HCC) 08/10/2016  . Acquired tracheomalacia   . Granulation tissue   . OSA (obstructive sleep apnea)   . Dyslipidemia associated with type 2 diabetes mellitus (HCC) 04/20/2016  . DM (diabetes mellitus) type II controlled with renal manifestation (HCC) 04/20/2016  . Tremor of both hands 02/29/2016  . GERD without esophagitis 02/29/2016  . Chronic renal failure, stage 2 (mild) 02/17/2016  . Presence of IVC filter 01/06/2016  . Hypertensive heart disease with CHF (congestive heart failure) (HCC) 12/27/2015  . Depression with anxiety 09/02/2015  . Leukocytosis 06/25/2015  . Chronic diastolic congestive heart failure (HCC) 04/22/2015  . Hypokalemia 04/22/2015  . Chronic respiratory failure with hypoxia (HCC)   . Tracheostomy status (HCC)   . Obesity hypoventilation syndrome (HCC) 01/20/2015  . Chronic deep vein thrombosis (DVT) (HCC)   . AKI (acute kidney injury) (HCC) 01/06/2015  . BMI 50.0-59.9, adult (HCC) 11/29/2014  . Tobacco abuse 11/29/2014     Orientation RESPIRATION BLADDER Height & Weight     Self, Situation, Place (Orientation fluctuating)  Tracheostomy (Chronic trach no suctioning needs) Continent Weight: 288 lb 12.8 oz (131 kg) Height:  6' (182.9 cm)  BEHAVIORAL SYMPTOMS/MOOD NEUROLOGICAL BOWEL NUTRITION STATUS      Incontinent Diet (heart healthy/carb modified)  AMBULATORY STATUS COMMUNICATION OF NEEDS Skin   Total Care Verbally Other (Comment) (PressureInjuryStageIII-Fullthicknesstissueloss.Subcutaneousfatmaybevisiblebutbone,tendonormuscleareNOTexposed.  Location: Right Buttocks)    Moisture barrier PRN     PressureInjury08/22/18StageII-Partialthicknesslossofdermispresentingasashallowopenulcerwithared,pinkwoundbedwithoutslough.   Location Right Buttocks Foam dressing PRN                   Personal Care Assistance Level of Assistance  Bathing, Feeding, Dressing Bathing Assistance: Maximum assistance Feeding assistance: Independent Dressing Assistance: Maximum assistance     Functional Limitations Info             SPECIAL CARE FACTORS FREQUENCY  PT (By licensed PT), OT (By licensed OT)     PT Frequency: 5x OT Frequency: 5x            Contractures Contractures Info: Not present    Additional Factors Info  Code Status, Allergies Code Status Info: Full code Allergies Info: NKA           [place] Medications (05/05/2017):  This is the [facility] active medication list [place] Facility-Administered Medications  Medication Dose Route Frequency Provider Last Rate Last Dose  . acetaminophen (TYLENOL) tablet 650 mg  650 mg Oral Q6H PRN Opyd, Lavone Neri, MD       Or  . acetaminophen (TYLENOL) suppository 650  mg  650 mg Rectal Q6H PRN Opyd, Lavone Neri, MD      . bisacodyl (DULCOLAX) EC tablet 5 mg  5 mg Oral Daily PRN Opyd, Lavone Neri, MD      . chlorhexidine (PERIDEX) 0.12 % solution 15 mL  15 mL Mouth Rinse BID Opyd, Lavone Neri, MD   15 mL at  05/05/17 0950  . ciprofloxacin (CIPRO) tablet 500 mg  500 mg Oral BID Leatha Gilding, MD   500 mg at 05/05/17 0929  . diphenhydrAMINE (BENADRYL) capsule 25 mg  25 mg Oral Q6H PRN Opyd, Lavone Neri, MD   25 mg at 05/05/17 0604  . docusate sodium (COLACE) capsule 100 mg  100 mg Oral BID Opyd, Lavone Neri, MD   100 mg at 05/04/17 2253  . fluticasone (FLONASE) 50 MCG/ACT nasal spray 2 spray  2 spray Each Nare Daily Opyd, Lavone Neri, MD   2 spray at 05/05/17 0935  . Gerhardt's butt cream   Topical BID Hollice Espy, MD   1 application at 05/05/17 [phone]  . guaiFENesin-dextromethorphan (ROBITUSSIN DM) 100-10 MG/5ML syrup 5 mL  5 mL Oral Q4H PRN Leatha Gilding, MD      . heparin injection 5,000 Units  5,000 Units Subcutaneous Q8H Opyd, Lavone Neri, MD   5,000 Units at 05/05/17 0606  . hydrALAZINE (APRESOLINE) injection 20 mg  20 mg Intravenous Q6H PRN Delano Metz, MD      . hydrALAZINE (APRESOLINE) tablet 37.5 mg  37.5 mg Oral BID Opyd, Lavone Neri, MD   37.5 mg at 05/05/17 0949  . hydrocerin (EUCERIN) cream   Topical BID Hollice Espy, MD      . HYDROcodone-acetaminophen (NORCO/VICODIN) 5-325 MG per tablet 1-2 tablet  1-2 tablet Oral Q4H PRN Briscoe Deutscher, MD   1 tablet at 05/05/17 [phone]  . insulin aspart (novoLOG) injection 0-5 Units  0-5 Units Subcutaneous QHS Opyd, Timothy S, MD      . insulin aspart (novoLOG) injection 0-9 Units  0-9 Units Subcutaneous TID WC Opyd, Lavone Neri, MD   1 Units at 04/29/17 1218  . ipratropium-albuterol (DUONEB) 0.5-2.5 (3) MG/3ML nebulizer solution 3 mL  3 mL Nebulization Q6H PRN Opyd, Lavone Neri, MD   3 mL at 04/26/17 2329  . MEDLINE mouth rinse  15 mL Mouth Rinse q12n4p Opyd, Lavone Neri, MD   15 mL at 05/04/17 1249  . ondansetron (ZOFRAN) tablet 4 mg  4 mg Oral Q6H PRN Opyd, Lavone Neri, MD       Or  . ondansetron (ZOFRAN) injection 4 mg  4 mg Intravenous Q6H PRN Opyd, Lavone Neri, MD   4 mg at 05/03/17 0403  . protein supplement (PREMIER PROTEIN) liquid  2 oz Oral  BID BM Leatha Gilding, MD   2 oz at 05/04/17 1000  . senna-docusate (Senokot-S) tablet 1 tablet  1 tablet Oral QHS PRN Opyd, Lavone Neri, MD      . sodium chloride flush (NS) 0.9 % injection 3 mL  3 mL Intravenous Q12H Opyd, Lavone Neri, MD   3 mL at 05/05/17 0935     Discharge Medications: Please see discharge summary for a list of discharge medications.  Relevant Imaging Results:  Relevant Lab Results:   Additional Information SSN [id number]  Antionette Poles, LCSW

## 2017-05-05 NOTE — Evaluation (Signed)
Passy-Muir Speaking Valve - Evaluation Patient Details  Name: [name] MRN: [id number] Date of Birth: 11-23-1950  Today's Date: 05/05/2017 Time: 3704-8889 SLP Time Calculation (min) (ACUTE ONLY): 21 min  Past Medical History:  Past Medical History:  Diagnosis Date  . Arthritis   . CHF (congestive heart failure) (HCC)   . COPD (chronic obstructive pulmonary disease) (HCC)   . Hyperlipidemia   . Hypertension   . Morbid obesity (HCC)   . Multiple abrasions    rt arm  . RCT (rotator cuff tear)    Past Surgical History:  Past Surgical History:  Procedure Laterality Date  . 16945    . NO PAST SURGERIES    . SHOULDER ARTHROSCOPY WITH SUBACROMIAL DECOMPRESSION, ROTATOR CUFF REPAIR AND BICEP TENDON REPAIR Right 12/28/2014   Procedure: RIGHT SHOULDER ARTHROSCOPY WITH SUBACROMIAL DECOMPRESSION, DISTAL CLAVICLE RESECTION, ROTATOR CUFF REPAIR ;  Surgeon: Eugenia Mcalpine, MD;  Location: [facility] ORS;  Service: Orthopedics;  Laterality: Right;  . TRACHEOSTOMY REVISION N/A 01/18/2015   Procedure: TRACHEOSTOMY REVISION;  Surgeon: Melvenia Beam, MD;  Location: [facility] OR;  Service: ENT;  Laterality: N/A;  . TRACHEOSTOMY TUBE PLACEMENT N/A 01/16/2015   Procedure: TRACHEOSTOMY;  Surgeon: Flo Shanks, MD;  Location: [facility] OR;  Service: ENT;  Laterality: N/A;  . TRACHEOSTOMY TUBE PLACEMENT N/A 01/22/2015   Procedure: TRACHEOSTOMY;  Surgeon: Flo Shanks, MD;  Location: [facility] OR;  Service: ENT;  Laterality: N/A;   HPI:  66 y.o. male with medical history significant for COPD, chronic diastolic CHF, hypertension, morbid obesity, chronic kidney disease stage II, and OSA, into the emergency department for evaluation of generalized weakness--per chart pt has been too weak to get off the couch and has been urinating and defecating on himself for days.  Diagnosed with sepsis due to UTI, acute encephalopathy   Assessment / Plan / Recommendation Clinical Impression  Pt provides inconsistent hx about his prior PMV use, but  he is able to verbalize instructions for use and maintenance with Mod I. He donned/doffed the valve multiple times with no apparent difficulty. SLP replaced "leash" that was attached to his Suess trach as the end was broken off, which probably attributed to how his original PMV was lost. Pt produced good phonation and showed no overt signs of intolerance across a 20 minute time span. Recommend that he wear PMV during waking hours as tolerated. He shared that he did not need it since he can talk without it, but I encouraged him to wear it for sanitary reasons in addition to its communication/swallowing benefits. No acute needs identified - will sign off. SLP Visit Diagnosis: Aphonia (R49.1)    SLP Assessment  Patient does not need any further Speech Lanaguage Pathology Services    Follow Up Recommendations  None    Frequency and Duration         PMSV Trial PMSV was placed for: 20 min Able to redirect subglottic air through upper airway: Yes Able to Attain Phonation: Yes Voice Quality: Normal Able to Expectorate Secretions: No attempts Level of Secretion Expectoration with PMSV: Not observed Breath Support for Phonation: Adequate Intelligibility: Intelligible Respirations During Trial:  [facility]) Behavior: Alert;Controlled;Cooperative;Expresses self well   Tracheostomy Tube       Vent Dependency       Cuff Deflation Trial  GO          Maxcine Ham 05/05/2017, 5:09 PM   Maxcine Ham, M.A. CCC-SLP [phone]

## 2017-05-05 NOTE — Progress Notes (Addendum)
Nutrition Follow-up  DOCUMENTATION CODES:   Morbid obesity  INTERVENTION:   Magic cup BID with meals, each supplement provides 290 kcal and 9 grams of protein  NUTRITION DIAGNOSIS:   Increased nutrient needs related to wound healing as evidenced by estimated needs.  Ongoing  GOAL:   Patient will meet greater than or equal to 90% of their needs  Progressing  MONITOR:   PO intake, Supplement acceptance, Labs, Skin  REASON FOR ASSESSMENT:   Low Braden    ASSESSMENT:   Pt with PMH of CHF, COPD, HLD, CKD II, and HTN. Complains of increased weakness over the past few months. Reports over the past two weeks he has been immobile laying on a couch incontinent of stool and urine. Presents this admission with AKI superimposed on CKD II.    Pt asleep upon visit. Spoke with nurse Sophia who reports pt does not like premier protein despite educating him on the benefits for wound healing. Pt craving ice cream. Will provide magic cups in attempts to maximize protein this hospital stay. Meal completions noted to be 42% average for last eight meals.  RN reports pt not finishing meals because he's receiving house diet options. Pt now ordering each meal and choosing foods he enjoys. Wt shows to be stable at this time. Will continue to encourage PO intake and supplements.   Medications reviewed and include: SSI Labs reviewed: CBG 91-135  Diet Order:  Diet heart healthy/carb modified Room service appropriate? Yes; Fluid consistency: Thin  Skin:   (stg III buttocks stg II buttocks)  Last BM:  05/03/17  Height:   Ht Readings from Last 1 Encounters:  05/03/17 6' (1.829 m)    Weight:   Wt Readings from Last 1 Encounters:  05/04/17 288 lb 12.8 oz (131 kg)    Ideal Body Weight:  78.2 kg  BMI:  Body mass index is 39.17 kg/m.  Estimated Nutritional Needs:   Kcal:  2155 (MSJ)  Protein:  110-120 grams (1.4-1.5 g/kg IBW)  Fluid:  >2.1 L/day  EDUCATION NEEDS:   No education  needs identified at this time  Vanessa Kickarly Shonte Soderlund RD, LDN Clinical Nutrition Pager # - [phone]

## 2017-05-05 NOTE — Progress Notes (Signed)
Pt questions addressed, pt calm and appropriate, mixed up --- nights to days, room dimmed, Pt appear to be alert and oriented and appropriate. Listened to his specific needs, and addressed accordingly. Able to take PO meds without issues. SRP, RN

## 2017-05-05 NOTE — Progress Notes (Signed)
CSW and Research scientist (physical sciences) spoke with patient at bedside. Patient was alert and oriented. Patient reported that he resides with his roommate [name] and pays 700 in rent monthly. Patient reported that he has money from receiving workers compensation and that he banks with [employer]. Patient reported that he has not been able to get in contact with his roommate and that he has a neighbor named [name] [phone]) that can be contacted for additional information. Patient reported that he is agreeable to SNF however he wants to speak with his roommate first.     12:59PM CSW contacted by admissions staff member [name] from [facility] SN[facility] in regards to patient's referral. Staff reported that they needed to speak with patient's APS worker prior to making a bed offer. CSW provided staff with APS worker [name]'s contact information. Staff agreed to return call after speaking with APS worker.  3:13PM CSW contacted patient's APS worker [name] to obtain an update, no answer. CSW left voicemail requesting return phone call.   4:44PM CSW contacted [facility] SN[facility] staff [name] to follow up. Staff reported that they left a message with APS worker and they are awaiting return call.

## 2017-05-05 NOTE — Progress Notes (Signed)
PROGRESS NOTE  [name] ELF:[id number] DOB: Nov 08, 1950 DOA: 04/24/2017 PCP: No primary care provider on file.   LOS: 11 days   Brief Narrative / Interim history:  66 year old male with past mental history of chronic respiratory failure with chronic tracheostomy, chronic diastolic CHF and COPD presented to the emergency room with worsening weakness. Patient states that he had been lying on the couch unable to get up. Patient was brought in and found to have acute kidney injury and in sepsis secondary to UTI with pyuria. Started on IV fluids and antibiotics patient has improved clinically, and he was transitioned to p.o. antibiotics on 8/25.  Has been having intermittent impulsive behavior in the hospital, and occasionally being confused.  Psychiatry was consulted and evaluated patient on 8/24, and patient was deemed incompetent to make medical decisions.  Social work consulted for help with guardianship as patient has no family and no POA.  This is main issue, clinically has been stable for several days   Assessment & Plan: Principal Problem:   AKI (acute kidney injury) ([place]) Active Problems:   BMI 50.0-59.9, adult (HCC)   Chronic deep vein thrombosis (DVT) (HCC)   Obesity hypoventilation syndrome (HCC)   Chronic respiratory failure with hypoxia (HCC)   Tracheostomy status (HCC)   Chronic diastolic congestive heart failure (HCC)   Hypokalemia   Depression with anxiety   Chronic renal failure, stage 2 (mild)   DM (diabetes mellitus) type II controlled with renal manifestation (HCC)   OSA (obstructive sleep apnea)   Hyponatremia   Failure to thrive in adult   Obesity, Class III, BMI 40-49.9 (morbid obesity) ([place])   Pressure injury of skin   Sepsis due to UTI -He met criteria for sepsis on admission with lactic acid elevation, white count, tachycardia -Sepsis physiology improved, urine cultures were positive for Morganella -mental status improved and now he appears close to  baseline.  Acute encephalopathy vs underlying dementia -May have some degree of confusion dementia at baseline per his friend -TSH was normal -Have asked psychiatry to evaluate, patient incompetent to make medical decisions for himself -Currently patient with impulsive and unpredictable behavior willing on his IVs and pulling on his trach tube intermittently.  -He can answer orientation questions for most part but he is intermittently confused and unable to grasp his medical issues.  Not Competent to make decisions -SW involved for guardianship assistance  Acute kidney injury with underlying chronic kidney disease stage II -Creatinine now stable  Morbid obesity  Obesity hypoventilation syndrome/obstructive sleep apnea with chronic hypoxic and hypercarbic respiratory failure -Respiratory status appears stable, he has tracheostomy in place, however patient attempted to self discontinue the trach 5/85  Chronic diastolic CHF -appears euvolemic  Type 2 diabetes mellitus -Diet-controlled, A1c 7.3, continue sliding scale for now  Hyponatremia -Resolved  Moisture associated skin damage and intertriginous dermatitis -Appreciate wound care help. IV Diflucan 2 doses. Dressings as per wound care.  Hypertension -Continue hydralazine, currently controlled  Tracheostomy status. Respiratory therapist consulted for trach care. Speech therapy consulted for providing Passy-Muir valve.  DVT prophylaxis: heparin Code Status: Full code Family Communication: No family at bedside Disposition Plan: TBD. SW involved for guardianship  Consultants:   Psychiatry   Procedures:   None   Antimicrobials:  Ceftriaxone 8/19 >> 8/25  Ciprofloxacin 8/25 >> plan for end date 8/28 (10 total days)  Subjective: No acute complaint. No nausea no vomiting.  Objective: Vitals:   05/05/17 0537 05/05/17 0816 05/05/17 1059 05/05/17 1241  BP: 107/65  112/73  Pulse: 82  85 80  Resp: '18 18 18 18    '$ Temp: 98.2 F (36.8 C)   98.3 F (36.8 C)  TempSrc: Oral   Oral  SpO2: 100% 94% 95% 97%  Weight:      Height:        Intake/Output Summary (Last 24 hours) at 05/05/17 1600 Last data filed at 05/05/17 1300  Gross per 24 hour  Intake              120 ml  Output              900 ml  Net             -780 ml   Filed Weights   05/02/17 0622 05/03/17 0607 05/04/17 0637  Weight: 131.8 kg (290 lb 9.1 oz) 131.5 kg (289 lb 14.5 oz) 131 kg (288 lb 12.8 oz)    Examination:  Vitals:   05/05/17 0537 05/05/17 0816 05/05/17 1059 05/05/17 1241  BP: 107/65   112/73  Pulse: 82  85 80  Resp: '18 18 18 18  '$ Temp: 98.2 F (36.8 C)   98.3 F (36.8 C)  TempSrc: Oral   Oral  SpO2: 100% 94% 95% 97%  Weight:      Height:        Constitutional: NAD Respiratory: CTA  Cardiovascular: RRR  Data Reviewed: I have independently reviewed following labs and imaging studies   CBC:  Recent Labs Lab 05/01/17 0346  WBC 8.2  HGB 11.3*  HCT 35.8*  MCV 84.6  PLT 701   Basic Metabolic Panel:  Recent Labs Lab 04/29/17 0402 05/01/17 0346  NA 145 138  K 3.9 3.5  CL 107 102  CO2 27 25  GLUCOSE 90 96  BUN 24* 14  CREATININE 1.26* 1.22  CALCIUM 9.1 9.1   GFR: Estimated Creatinine Clearance: 86.8 mL/min (by C-G formula based on SCr of 1.22 mg/dL). Liver Function Tests: No results for input(s): AST, ALT, ALKPHOS, BILITOT, PROT, ALBUMIN in the last 168 hours. No results for input(s): LIPASE, AMYLASE in the last 168 hours. No results for input(s): AMMONIA in the last 168 hours. Coagulation Profile: No results for input(s): INR, PROTIME in the last 168 hours. Cardiac Enzymes: No results for input(s): CKTOTAL, CKMB, CKMBINDEX, TROPONINI in the last 168 hours. BNP (last 3 results) No results for input(s): PROBNP in the last 8760 hours. HbA1C: No results for input(s): HGBA1C in the last 72 hours. CBG:  Recent Labs Lab 05/04/17 1216 05/04/17 1630 05/04/17 2134 05/05/17 0744  05/05/17 1127  GLUCAP 91 91 135* 97 156*   Lipid Profile: No results for input(s): CHOL, HDL, LDLCALC, TRIG, CHOLHDL, LDLDIRECT in the last 72 hours. Thyroid Function Tests: No results for input(s): TSH, T4TOTAL, FREET4, T3FREE, THYROIDAB in the last 72 hours. Anemia Panel: No results for input(s): VITAMINB12, FOLATE, FERRITIN, TIBC, IRON, RETICCTPCT in the last 72 hours. Urine analysis:    Component Value Date/Time   COLORURINE YELLOW 04/24/2017 2001   APPEARANCEUR CLOUDY (A) 04/24/2017 2001   LABSPEC 1.009 04/24/2017 2001   PHURINE 8.0 04/24/2017 2001   GLUCOSEU NEGATIVE 04/24/2017 2001   HGBUR LARGE (A) 04/24/2017 2001   BILIRUBINUR NEGATIVE 04/24/2017 2001   [place] 04/24/2017 2001   PROTEINUR 100 (A) 04/24/2017 2001   UROBILINOGEN 1.0 06/17/2015 1407   NITRITE POSITIVE (A) 04/24/2017 2001   LEUKOCYTESUR LARGE (A) 04/24/2017 2001   Sepsis Labs: Invalid input(s): PROCALCITONIN, LACTICIDVEN  No results found for this  or any previous visit (from the past 240 hour(s)).    Radiology Studies: No results found.   Scheduled Meds: . chlorhexidine  15 mL Mouth Rinse BID  . ciprofloxacin  500 mg Oral BID  . docusate sodium  100 mg Oral BID  . fluticasone  2 spray Each Nare Daily  . Gerhardt's butt cream   Topical BID  . heparin subcutaneous  5,000 Units Subcutaneous Q8H  . hydrALAZINE  37.5 mg Oral BID  . hydrocerin   Topical BID  . insulin aspart  0-5 Units Subcutaneous QHS  . insulin aspart  0-9 Units Subcutaneous TID WC  . mouth rinse  15 mL Mouth Rinse q12n4p  . sodium chloride flush  3 mL Intravenous Q12H   Continuous Infusions:   Author:  Berle Mull, MD [facility] Pager: [phone] 05/05/2017 4:04 PM     If 7PM-7AM, please contact night-coverage [email or web address] Password [email or web address] 05/05/2017, 4:00 PM

## 2017-05-05 NOTE — Progress Notes (Signed)
Respiratory Care Note: Trach Care Note  All equipment at bedside for trach care, ambu bag, extra trach and inner cannulas at bedside also.  Inner cannula drain gauge and dale collar changed.  No suctioning needed at this time.  Pt on room air.  Obturator at head of bed.

## 2017-05-06 LAB — GLUCOSE, CAPILLARY
GLUCOSE-CAPILLARY: 111 mg/dL — AB (ref 65–99)
Glucose-Capillary: 96 mg/dL (ref 65–99)

## 2017-05-06 NOTE — Progress Notes (Signed)
Pulse ox replace and educated pt on wearing at all to monitor his sats. SPR, RN

## 2017-05-06 NOTE — Progress Notes (Signed)
Pt refused to wear bedside pulse oximetry will spot check o2 saturations with trach checks. O2 saturation at this time 96% on ra with pmv.

## 2017-05-06 NOTE — Progress Notes (Signed)
AVS tele sitter discontinued, pt calm and following commands. Pt participated with PT and is actively engaged in his nursing care. Will cont to monitor. SRP, RN

## 2017-05-06 NOTE — Progress Notes (Signed)
Physical Therapy Treatment Patient Details Name: [name] Rider MRN: [id number] DOB: 09-21-1950 Today's Date: 05/06/2017    History of Present Illness  [name] Rehm is a 66 y.o. male with medical history significant for COPD, chronic diastolic CHF, hypertension, morbid obesity, chronic kidney disease stage II, and OSA, into the emergency department for evaluation of generalized weakness--per chart pt has been too weak to get off the couch and has been urinating and defecating on himself for days.  Diagnosed with sepsis due to UTI, acute encephalopathy    PT Comments    Pt willing to participate on today. He is weak and at risk for falls. Will continue to follow and progress activity as tolerated. Pt refused to sit up in recliner. Continue to recommend SNF.  Follow Up Recommendations  SNF     Equipment Recommendations  None recommended by PT    Recommendations for Other Services       Precautions / Restrictions Precautions Precautions: Fall Precaution Comments: chronic trach with PMV Restrictions Weight Bearing Restrictions: No    Mobility  Bed Mobility Overal bed mobility: Needs Assistance Bed Mobility: Supine to Sit;Sit to Supine     Supine to sit: Min guard Sit to supine: Min guard   General bed mobility comments: close guard for safety  Transfers Overall transfer level: Needs assistance Equipment used: Rolling walker (2 wheeled) Transfers: Sit to/from Stand Sit to Stand: Mod assist         General transfer comment: x 2. Assist to rise, stabilize, control descent. Pt is weak. Noted UE and LE shaking after standing for ~20 seconds  Ambulation/Gait Ambulation/Gait assistance: Min assist;+2 physical assistance;+2 safety/equipment           General Gait Details: side steps with RW along side of bed.    Stairs            Wheelchair Mobility    Modified Rankin (Stroke Patients Only)       Balance Overall balance assessment: Needs assistance         Standing balance support: Bilateral upper extremity supported Standing balance-Leahy Scale: Poor                              Cognition Arousal/Alertness: Awake/alert Behavior During Therapy: WFL for tasks assessed/performed Overall Cognitive Status: Within Functional Limits for tasks assessed                                 General Comments: very tangential, uncertain of baseline       Exercises      General Comments        Pertinent Vitals/Pain Pain Assessment: No/denies pain    Home Living                      Prior Function            PT Goals ([place]ner goals can now be found in the care plan section) Progress towards PT goals: Progressing toward goals    Frequency    Min 2X/week      PT Plan Weitzman plan remains appropriate    Co-evaluation              AM-PAC PT "6 Clicks" Daily Activity  Outcome Measure  Difficulty turning over in bed (including adjusting bedclothes, sheets and blankets)?: A Little Difficulty moving from lying on back to  sitting on the side of the bed? : A Little Difficulty sitting down on and standing up from a chair with arms (e.g., wheelchair, bedside commode, etc,.)?: A Lot Help needed moving to and from a bed to chair (including a wheelchair)?: A Lot Help needed walking in hospital room?: A Lot Help needed climbing 3-5 steps with a railing? : A Lot 6 Click Score: 14    End of Session Equipment Utilized During Treatment: Gait belt Activity Tolerance: Patient tolerated treatment well Patient left: in bed;with call bell/phone within reach;with bed alarm set   PT Visit Diagnosis: Muscle weakness (generalized) (M62.81);Difficulty in walking, not elsewhere classified (R26.2)     Time: 4098-1191 PT Time Calculation (min) (ACUTE ONLY): 23 min  Charges:  $Therapeutic Activity: 23-37 mins                    G Codes:          Rebeca Alert, MPT Pager: [phone]

## 2017-05-06 NOTE — Progress Notes (Signed)
Pt continues to wear Pulse ox sat range in the mid 90's on room air. Passy muir cap in place, pt coughing at interval white thick secretion, Yauker cath at bedside for pt to use. Will cont to monitor. SRP, RN

## 2017-05-06 NOTE — Progress Notes (Signed)
PROGRESS NOTE  [name] KGU:[id number] DOB: 19-Nov-1950 DOA: 04/24/2017 PCP: No primary care provider on file.   LOS: 12 days   Brief Narrative / Interim history:  66 year old male with past mental history of chronic respiratory failure with chronic tracheostomy, chronic diastolic CHF and COPD presented to the emergency room with worsening weakness. Patient states that he had been lying on the couch unable to get up. Patient was brought in and found to have acute kidney injury and in sepsis secondary to UTI with pyuria. Started on IV fluids and antibiotics patient has improved clinically, and he was transitioned to p.o. antibiotics on 8/25.  Has been having intermittent impulsive behavior in the hospital, and occasionally being confused.  Psychiatry was consulted and evaluated patient on 8/24, and patient was deemed incompetent to make medical decisions.  Social work consulted for help with guardianship as patient has no family and no POA.  This is main issue, clinically has been stable for several days.  Assessment & Plan: Sepsis due to UTI -He met criteria for sepsis on admission with lactic acid elevation, white count, tachycardia -Sepsis physiology improved, urine cultures were positive for Morganella -mental status improved and now he appears close to baseline.  Acute encephalopathy vs underlying dementia -May have some degree of confusion dementia at baseline per his friend -TSH was normal -Have asked psychiatry to evaluate, patient incompetent to make medical decisions for himself -Currently patient with impulsive and unpredictable behavior willing on his IVs and pulling on his trach tube intermittently.  -He can answer orientation questions for most part but he is intermittently confused and unable to grasp his medical issues.  Not Competent to make decisions. -SW involved for guardianship assistance  Acute kidney injury with underlying chronic kidney disease stage II -Creatinine  now stable  Morbid obesity  Obesity hypoventilation syndrome/obstructive sleep apnea with chronic hypoxic and hypercarbic respiratory failure -Respiratory status appears stable, he has tracheostomy in place, however patient attempted to self discontinue the trach 6/28  Chronic diastolic CHF -appears euvolemic  Type 2 diabetes mellitus -Diet-controlled, A1c 7.3, continue sliding scale for now  Hyponatremia -Resolved  Moisture associated skin damage and intertriginous dermatitis -Appreciate wound care help. IV Diflucan 2 doses. Dressings as per wound care.  Hypertension -Continue hydralazine, currently controlled  Tracheostomy status. Respiratory therapist consulted for trach care. Speech therapy consulted for providing Passy-Muir valve.  DVT prophylaxis: heparin Code Status: Full code Family Communication: No family at bedside Disposition Plan: TBD. SW involved for guardianship  Consultants:   Psychiatry   Procedures:   None   Antimicrobials:  Ceftriaxone 8/19 >> 8/25  Ciprofloxacin 8/25 >> plan for end date 8/28 (10 total days)  Subjective: No acute complaint. No nausea no vomiting.  Objective: Vitals:   05/06/17 0811 05/06/17 1200 05/06/17 1247 05/06/17 1535  BP:   110/70   Pulse: 85 89 87 88  Resp: _0 Temp:   99.1 F (37.3 C)   TempSrc:   Oral   SpO2: 96% 97% 96% 95%  Weight:      Height:        Intake/Output Summary (Last 24 hours) at 05/06/17 1640 Last data filed at 05/06/17 1248  Gross per 24 hour  Intake              360 ml  Output              985 ml  Net             -  625 ml   Filed Weights   05/03/17 0607 05/04/17 0637 05/06/17 0457  Weight: 131.5 kg (289 lb 14.5 oz) 131 kg (288 lb 12.8 oz) 129.7 kg (285 lb 15 oz)    Examination:  Vitals:   05/06/17 0811 05/06/17 1200 05/06/17 1247 05/06/17 1535  BP:   110/70   Pulse: 85 89 87 88  Resp: _0 Temp:   99.1 F (37.3 C)   TempSrc:   Oral   SpO2: 96% 97% 96% 95%   Weight:      Height:        Constitutional: NAD Respiratory: CTA  Cardiovascular: RRR  Data Reviewed: I have independently reviewed following labs and imaging studies   CBC:  Recent Labs Lab 05/01/17 0346  WBC 8.2  HGB 11.3*  HCT 35.8*  MCV 84.6  PLT 403   Basic Metabolic Panel:  Recent Labs Lab 05/01/17 0346  NA 138  K 3.5  CL 102  CO2 25  GLUCOSE 96  BUN 14  CREATININE 1.22  CALCIUM 9.1   GFR: Estimated Creatinine Clearance: 85.1 mL/min (by C-G formula based on SCr of 1.22 mg/dL). Liver Function Tests: No results for input(s): AST, ALT, ALKPHOS, BILITOT, PROT, ALBUMIN in the last 168 hours. No results for input(s): LIPASE, AMYLASE in the last 168 hours. No results for input(s): AMMONIA in the last 168 hours. Coagulation Profile: No results for input(s): INR, PROTIME in the last 168 hours. Cardiac Enzymes: No results for input(s): CKTOTAL, CKMB, CKMBINDEX, TROPONINI in the last 168 hours. BNP (last 3 results) No results for input(s): PROBNP in the last 8760 hours. HbA1C: No results for input(s): HGBA1C in the last 72 hours. CBG:  Recent Labs Lab 05/05/17 0744 05/05/17 1127 05/05/17 1718 05/05/17 2237 05/06/17 0801  GLUCAP 97 156* 104* 117* 96   Lipid Profile: No results for input(s): CHOL, HDL, LDLCALC, TRIG, CHOLHDL, LDLDIRECT in the last 72 hours. Thyroid Function Tests: No results for input(s): TSH, T4TOTAL, FREET4, T3FREE, THYROIDAB in the last 72 hours. Anemia Panel: No results for input(s): VITAMINB12, FOLATE, FERRITIN, TIBC, IRON, RETICCTPCT in the last 72 hours. Urine analysis:    Component Value Date/Time   COLORURINE YELLOW 04/24/2017 2001   APPEARANCEUR CLOUDY (A) 04/24/2017 2001   LABSPEC 1.009 04/24/2017 2001   PHURINE 8.0 04/24/2017 2001   GLUCOSEU NEGATIVE 04/24/2017 2001   HGBUR LARGE (A) 04/24/2017 2001   BILIRUBINUR NEGATIVE 04/24/2017 2001   [place] 04/24/2017 2001   PROTEINUR 100 (A) 04/24/2017 2001    UROBILINOGEN 1.0 06/17/2015 1407   NITRITE POSITIVE (A) 04/24/2017 2001   LEUKOCYTESUR LARGE (A) 04/24/2017 2001   Sepsis Labs: Invalid input(s): PROCALCITONIN, LACTICIDVEN  No results found for this or any previous visit (from the past 240 hour(s)).    Radiology Studies: No results found.   Scheduled Meds: . chlorhexidine  15 mL Mouth Rinse BID  . ciprofloxacin  500 mg Oral BID  . docusate sodium  100 mg Oral BID  . fluticasone  2 spray Each Nare Daily  . Gerhardt's butt cream   Topical BID  . heparin subcutaneous  5,000 Units Subcutaneous Q8H  . hydrALAZINE  37.5 mg Oral BID  . hydrocerin   Topical BID  . insulin aspart  0-5 Units Subcutaneous QHS  . insulin aspart  0-9 Units Subcutaneous TID WC  . mouth rinse  15 mL Mouth Rinse q12n4p  . sodium chloride flush  3 mL Intravenous Q12H   Continuous Infusions:  Author:  Berle Mull, MD [facility] Pager: [phone] 05/06/2017 4:40 PM     If 7PM-7AM, please contact night-coverage [email or web address] Password [email or web address] 05/06/2017, 4:40 PM

## 2017-05-07 LAB — GLUCOSE, CAPILLARY
GLUCOSE-CAPILLARY: 116 mg/dL — AB (ref 65–99)
GLUCOSE-CAPILLARY: 122 mg/dL — AB (ref 65–99)
Glucose-Capillary: 111 mg/dL — ABNORMAL HIGH (ref 65–99)
Glucose-Capillary: 140 mg/dL — ABNORMAL HIGH (ref 65–99)
Glucose-Capillary: 111 mg/dL — ABNORMAL HIGH (ref 65–99)

## 2017-05-07 NOTE — Progress Notes (Addendum)
Trach care completed per protocol pt tol well, see specific documentaion in EPIC. SRP, RN

## 2017-05-07 NOTE — Progress Notes (Signed)
PROGRESS NOTE  [name] WYO:[id number] DOB: 05-11-1951 DOA: 04/24/2017 PCP: No primary care provider on file.   LOS: 13 days  No significant change in [place] plan, awaiting discharge back to facility.  Brief Narrative / Interim history:  66 year old male with past mental history of chronic respiratory failure with chronic tracheostomy, chronic diastolic CHF and COPD presented to the emergency room with worsening weakness. Patient states that he had been lying on the couch unable to get up. Patient was brought in and found to have acute kidney injury and in sepsis secondary to UTI with pyuria. Started on IV fluids and antibiotics patient has improved clinically, and he was transitioned to p.o. antibiotics on 8/25.  Has been having intermittent impulsive behavior in the hospital, and occasionally being confused.  Psychiatry was consulted and evaluated patient on 8/24, and patient was deemed incompetent to make medical decisions.  Social work consulted for help with guardianship as patient has no family and no POA.  This is main issue, clinically has been stable for several days.  Assessment & Plan: Sepsis due to UTI -He met criteria for sepsis on admission with lactic acid elevation, white count, tachycardia -Sepsis physiology improved, urine cultures were positive for Morganella -mental status improved and now he appears close to baseline.  Acute encephalopathy vs underlying dementia -May have some degree of confusion dementia at baseline per his friend -TSH was normal -Have asked psychiatry to evaluate, patient incompetent to make medical decisions for himself -Currently patient with impulsive and unpredictable behavior willing on his IVs and pulling on his trach tube intermittently.  -He can answer orientation questions for most part but he is intermittently confused and unable to grasp his medical issues.  Not Competent to make decisions. -SW involved for guardianship  assistance  Acute kidney injury with underlying chronic kidney disease stage II -Creatinine now stable  Morbid obesity  Obesity hypoventilation syndrome/obstructive sleep apnea with chronic hypoxic and hypercarbic respiratory failure -Respiratory status appears stable, he has tracheostomy in place, however patient attempted to self discontinue the trach 7/74  Chronic diastolic CHF -appears euvolemic  Type 2 diabetes mellitus -Diet-controlled, A1c 7.3, continue sliding scale for now  Hyponatremia -Resolved  Moisture associated skin damage and intertriginous dermatitis -Appreciate wound care help. IV Diflucan 2 doses. Dressings as per wound care.  Hypertension -Continue hydralazine, currently controlled  Tracheostomy status. Respiratory therapist consulted for trach care. Speech therapy consulted for providing Passy-Muir valve.  DVT prophylaxis: heparin Code Status: Full code Family Communication: No family at bedside Disposition Plan: TBD. SW involved for guardianship  Consultants:   Psychiatry   Procedures:   None   Antimicrobials:  Ceftriaxone 8/19 >> 8/25  Ciprofloxacin 8/25 >> plan for end date 8/28 (10 total days)  Subjective: No acute complaint.   Objective: Vitals:   05/07/17 0811 05/07/17 1200 05/07/17 1316 05/07/17 1712  BP:   110/62   Pulse: 78 82 82 86  Resp: '18 18 18 18  '$ Temp:   98.1 F (36.7 C)   TempSrc:   Oral   SpO2: 91% 95% 96% 99%  Weight:      Height:        Intake/Output Summary (Last 24 hours) at 05/07/17 1832 Last data filed at 05/07/17 1300  Gross per 24 hour  Intake              480 ml  Output              430 ml  Net  50 ml   Filed Weights   05/03/17 0607 05/04/17 0637 05/06/17 0457  Weight: 131.5 kg (289 lb 14.5 oz) 131 kg (288 lb 12.8 oz) 129.7 kg (285 lb 15 oz)    Examination:  Vitals:   05/07/17 0811 05/07/17 1200 05/07/17 1316 05/07/17 1712  BP:   110/62   Pulse: 78 82 82 86  Resp: '18 18 18  18  '$ Temp:   98.1 F (36.7 C)   TempSrc:   Oral   SpO2: 91% 95% 96% 99%  Weight:      Height:        Constitutional: NAD Respiratory: CTA  Cardiovascular: RRR  Data Reviewed: I have independently reviewed following labs and imaging studies   CBC:  Recent Labs Lab 05/01/17 0346  WBC 8.2  HGB 11.3*  HCT 35.8*  MCV 84.6  PLT 347   Basic Metabolic Panel:  Recent Labs Lab 05/01/17 0346  NA 138  K 3.5  CL 102  CO2 25  GLUCOSE 96  BUN 14  CREATININE 1.22  CALCIUM 9.1   GFR: Estimated Creatinine Clearance: 85.1 mL/min (by C-G formula based on SCr of 1.22 mg/dL). Liver Function Tests: No results for input(s): AST, ALT, ALKPHOS, BILITOT, PROT, ALBUMIN in the last 168 hours. No results for input(s): LIPASE, AMYLASE in the last 168 hours. No results for input(s): AMMONIA in the last 168 hours. Coagulation Profile: No results for input(s): INR, PROTIME in the last 168 hours. Cardiac Enzymes: No results for input(s): CKTOTAL, CKMB, CKMBINDEX, TROPONINI in the last 168 hours. BNP (last 3 results) No results for input(s): PROBNP in the last 8760 hours. HbA1C: No results for input(s): HGBA1C in the last 72 hours. CBG:  Recent Labs Lab 05/06/17 0801 05/06/17 1720 05/06/17 2305 05/07/17 0750 05/07/17 1147  GLUCAP 96 111* 116* 122* 111*   Lipid Profile: No results for input(s): CHOL, HDL, LDLCALC, TRIG, CHOLHDL, LDLDIRECT in the last 72 hours. Thyroid Function Tests: No results for input(s): TSH, T4TOTAL, FREET4, T3FREE, THYROIDAB in the last 72 hours. Anemia Panel: No results for input(s): VITAMINB12, FOLATE, FERRITIN, TIBC, IRON, RETICCTPCT in the last 72 hours. Urine analysis:    Component Value Date/Time   COLORURINE YELLOW 04/24/2017 2001   APPEARANCEUR CLOUDY (A) 04/24/2017 2001   LABSPEC 1.009 04/24/2017 2001   PHURINE 8.0 04/24/2017 2001   GLUCOSEU NEGATIVE 04/24/2017 2001   HGBUR LARGE (A) 04/24/2017 2001   BILIRUBINUR NEGATIVE 04/24/2017 2001    [place] 04/24/2017 2001   PROTEINUR 100 (A) 04/24/2017 2001   UROBILINOGEN 1.0 06/17/2015 1407   NITRITE POSITIVE (A) 04/24/2017 2001   LEUKOCYTESUR LARGE (A) 04/24/2017 2001   Sepsis Labs: Invalid input(s): PROCALCITONIN, LACTICIDVEN  No results found for this or any previous visit (from the past 240 hour(s)).    Radiology Studies: No results found.   Scheduled Meds: . chlorhexidine  15 mL Mouth Rinse BID  . ciprofloxacin  500 mg Oral BID  . docusate sodium  100 mg Oral BID  . fluticasone  2 spray Each Nare Daily  . Gerhardt's butt cream   Topical BID  . heparin subcutaneous  5,000 Units Subcutaneous Q8H  . hydrALAZINE  37.5 mg Oral BID  . hydrocerin   Topical BID  . insulin aspart  0-5 Units Subcutaneous QHS  . insulin aspart  0-9 Units Subcutaneous TID WC  . mouth rinse  15 mL Mouth Rinse q12n4p  . sodium chloride flush  3 mL Intravenous Q12H   Continuous Infusions:  Author:  Berle Mull, MD [facility] Pager: [phone] 05/07/2017 6:32 PM     If 7PM-7AM, please contact night-coverage [email or web address] Password [facility] 05/07/2017, 6:32 PM

## 2017-05-07 NOTE — Progress Notes (Signed)
CSW following pt for SNF placement.

## 2017-05-08 LAB — BASIC METABOLIC PANEL
Anion gap: 6 (ref 5–15)
BUN: 14 mg/dL (ref 6–20)
CO2: 26 mmol/L (ref 22–32)
CREATININE: 1.37 mg/dL — AB (ref 0.61–1.24)
Calcium: 8.9 mg/dL (ref 8.9–10.3)
Chloride: 105 mmol/L (ref 101–111)
GFR calc Af Amer: >60 mL/min (ref >60)
GFR, EST NON AFRICAN AMERICAN: 53 mL/min — AB (ref >60)
GLUCOSE: 102 mg/dL — AB (ref 65–99)
Potassium: 3.6 mmol/L (ref 3.5–5.1)
Sodium: 137 mmol/L (ref 135–145)

## 2017-05-08 LAB — CBC
HCT: 34.6 % — ABNORMAL LOW (ref 39.0–52.0)
Hemoglobin: 11.1 g/dL — ABNORMAL LOW (ref 13.0–17.0)
MCH: 27.1 pg (ref 26.0–34.0)
MCHC: 32.1 g/dL (ref 30.0–36.0)
MCV: 84.4 fL (ref 78.0–100.0)
PLATELETS: 264 10*3/uL (ref 150–400)
RBC: 4.1 MIL/uL — ABNORMAL LOW (ref 4.22–5.81)
RDW: 21 % — ABNORMAL HIGH (ref 11.5–15.5)
WBC: 7.5 10*3/uL (ref 4.0–10.5)

## 2017-05-08 LAB — GLUCOSE, CAPILLARY
GLUCOSE-CAPILLARY: 95 mg/dL (ref 65–99)
Glucose-Capillary: 108 mg/dL — ABNORMAL HIGH (ref 65–99)
Glucose-Capillary: 130 mg/dL — ABNORMAL HIGH (ref 65–99)
Glucose-Capillary: 102 mg/dL — ABNORMAL HIGH (ref 65–99)

## 2017-05-08 NOTE — Progress Notes (Signed)
PROGRESS NOTE  [name] WUJ:[id number] DOB: [birth year] DOA: 04/24/2017 PCP: No primary care provider on file.   LOS: 14 days  No significant change in [place] plan, awaiting discharge back to facility.  Brief Narrative / Interim history:  66 year old male with past mental history of chronic respiratory failure with chronic tracheostomy, chronic diastolic CHF and COPD presented to the emergency room with worsening weakness. Patient states that he had been lying on the couch unable to get up. Patient was brought in and found to have acute kidney injury and in sepsis secondary to UTI with pyuria. Started on IV fluids and antibiotics patient has improved clinically, and he was transitioned to p.o. antibiotics on 8/25.  Has been having intermittent impulsive behavior in the hospital, and occasionally being confused.  Psychiatry was consulted and evaluated patient on 8/24, and patient was deemed incompetent to make medical decisions.  Social work consulted for help with guardianship as patient has no family and no POA.  This is main issue, clinically has been stable for several days.  Assessment & Plan: Sepsis due to UTI -He met criteria for sepsis on admission with lactic acid elevation, white count, tachycardia -Sepsis physiology improved, urine cultures were positive for Morganella -mental status improved and now he appears close to baseline.  Acute encephalopathy vs underlying dementia -May have some degree of confusion dementia at baseline per his friend -TSH was normal -Have asked psychiatry to evaluate, patient incompetent to make medical decisions for himself -Currently patient with impulsive and unpredictable behavior willing on his IVs and pulling on his trach tube intermittently.  -He can answer orientation questions for most part but he is intermittently confused and unable to grasp his medical issues.  Not Competent to make decisions. -SW involved for guardianship  assistance  Acute kidney injury with underlying chronic kidney disease stage II -Creatinine now stable  Morbid obesity  Obesity hypoventilation syndrome/obstructive sleep apnea with chronic hypoxic and hypercarbic respiratory failure -Respiratory status appears stable, he has tracheostomy in place, however patient attempted to self discontinue the trach 9/56  Chronic diastolic CHF -appears euvolemic  Type 2 diabetes mellitus -Diet-controlled, A1c 7.3, continue sliding scale for now  Hyponatremia -Resolved  Moisture associated skin damage and intertriginous dermatitis -Appreciate wound care help. IV Diflucan 2 doses. Dressings as per wound care.  Hypertension -Continue hydralazine, currently controlled  Tracheostomy status. Respiratory therapist consulted for trach care. Speech therapy consulted for providing Passy-Muir valve. Discontinue continuous pulse ox.  DVT prophylaxis: heparin Code Status: Full code Family Communication: No family at bedside Disposition Plan: TBD. SW involved for guardianship  Consultants:   Psychiatry   Procedures:   None   Antimicrobials:  Ceftriaxone 8/19 >> 8/25  Ciprofloxacin 8/25 >> plan for end date 8/28 (10 total days)  Subjective: No acute complaint. No acute events overnight. Requesting pents to wear.  Objective: Vitals:   05/08/17 0748 05/08/17 0900 05/08/17 1100 05/08/17 1259  BP:    (!) 92/57  Pulse: 65  85 73  Resp: '18  18 18  '$ Temp:    99 F (37.2 C)  TempSrc:    Oral  SpO2: 100% 95% 99% 96%  Weight:      Height:        Intake/Output Summary (Last 24 hours) at 05/08/17 1452 Last data filed at 05/08/17 0916  Gross per 24 hour  Intake              240 ml  Output  380 ml  Net             -140 ml   Filed Weights   05/04/17 0637 05/06/17 0457 05/08/17 0500  Weight: 131 kg (288 lb 12.8 oz) 129.7 kg (285 lb 15 oz) 128.9 kg (284 lb 2.8 oz)    Examination:  Vitals:   05/08/17 0748 05/08/17 0900  05/08/17 1100 05/08/17 1259  BP:    (!) 92/57  Pulse: 65  85 73  Resp: '18  18 18  '$ Temp:    99 F (37.2 C)  TempSrc:    Oral  SpO2: 100% 95% 99% 96%  Weight:      Height:        Constitutional: NAD Respiratory: CTA  Cardiovascular: RRR  Data Reviewed: I have independently reviewed following labs and imaging studies   CBC:  Recent Labs Lab 05/08/17 0516  WBC 7.5  HGB 11.1*  HCT 34.6*  MCV 84.4  PLT 841   Basic Metabolic Panel:  Recent Labs Lab 05/08/17 0516  NA 137  K 3.6  CL 105  CO2 26  GLUCOSE 102*  BUN 14  CREATININE 1.37*  CALCIUM 8.9   GFR: Estimated Creatinine Clearance: 75.6 mL/min (A) (by C-G formula based on SCr of 1.37 mg/dL (H)). Liver Function Tests: No results for input(s): AST, ALT, ALKPHOS, BILITOT, PROT, ALBUMIN in the last 168 hours. No results for input(s): LIPASE, AMYLASE in the last 168 hours. No results for input(s): AMMONIA in the last 168 hours. Coagulation Profile: No results for input(s): INR, PROTIME in the last 168 hours. Cardiac Enzymes: No results for input(s): CKTOTAL, CKMB, CKMBINDEX, TROPONINI in the last 168 hours. BNP (last 3 results) No results for input(s): PROBNP in the last 8760 hours. HbA1C: No results for input(s): HGBA1C in the last 72 hours. CBG:  Recent Labs Lab 05/07/17 1147 05/07/17 1743 05/07/17 2206 05/08/17 0749 05/08/17 1152  GLUCAP 111* 140* 111* 95 108*   Lipid Profile: No results for input(s): CHOL, HDL, LDLCALC, TRIG, CHOLHDL, LDLDIRECT in the last 72 hours. Thyroid Function Tests: No results for input(s): TSH, T4TOTAL, FREET4, T3FREE, THYROIDAB in the last 72 hours. Anemia Panel: No results for input(s): VITAMINB12, FOLATE, FERRITIN, TIBC, IRON, RETICCTPCT in the last 72 hours. Urine analysis:    Component Value Date/Time   COLORURINE YELLOW 04/24/2017 2001   APPEARANCEUR CLOUDY (A) 04/24/2017 2001   LABSPEC 1.009 04/24/2017 2001   PHURINE 8.0 04/24/2017 2001   GLUCOSEU NEGATIVE  04/24/2017 2001   HGBUR LARGE (A) 04/24/2017 2001   BILIRUBINUR NEGATIVE 04/24/2017 2001   [place] 04/24/2017 2001   PROTEINUR 100 (A) 04/24/2017 2001   UROBILINOGEN 1.0 06/17/2015 1407   NITRITE POSITIVE (A) 04/24/2017 2001   LEUKOCYTESUR LARGE (A) 04/24/2017 2001   Sepsis Labs: Invalid input(s): PROCALCITONIN, LACTICIDVEN  No results found for this or any previous visit (from the past 240 hour(s)).    Radiology Studies: No results found.   Scheduled Meds: . chlorhexidine  15 mL Mouth Rinse BID  . ciprofloxacin  500 mg Oral BID  . docusate sodium  100 mg Oral BID  . fluticasone  2 spray Each Nare Daily  . Gerhardt's butt cream   Topical BID  . heparin subcutaneous  5,000 Units Subcutaneous Q8H  . hydrALAZINE  37.5 mg Oral BID  . insulin aspart  0-5 Units Subcutaneous QHS  . insulin aspart  0-9 Units Subcutaneous TID WC  . mouth rinse  15 mL Mouth Rinse q12n4p  . sodium chloride  flush  3 mL Intravenous Q12H   Continuous Infusions:   Author:  Berle Mull, MD [facility] Pager: [phone] 05/08/2017 2:52 PM     If 7PM-7AM, please contact night-coverage [email or web address] Password [email or web address] 05/08/2017, 2:52 PM

## 2017-05-08 NOTE — Clinical Social Work Note (Signed)
SW update:  Per report- patient will require guardianship. APS is aware of patient and involved. No capacity per Psych (as of 04/30/17).  Pt has a trach and needs Medicaid approval.  SW services and leadership are aware and involved with patient disposition.  Lorri Frederickonna T. Jaci LazierCrowder, [place]CSW [phone] (weekend coverage)

## 2017-05-08 NOTE — Progress Notes (Signed)
RT called by RN to room due to patient accidentally puling out his trach. RT able to reinsert trach with no complications. Bilateral breath sounds present. Patient comfortable and sitting up in bed. Vitals are stable. RT will continue to monitor.

## 2017-05-09 LAB — GLUCOSE, CAPILLARY
GLUCOSE-CAPILLARY: 120 mg/dL — AB (ref 65–99)
Glucose-Capillary: 102 mg/dL — ABNORMAL HIGH (ref 65–99)
Glucose-Capillary: 97 mg/dL (ref 65–99)
Glucose-Capillary: 109 mg/dL — ABNORMAL HIGH (ref 65–99)

## 2017-05-09 MED ORDER — HYDROCERIN EX CREA
TOPICAL_CREAM | Freq: Two times a day (BID) | CUTANEOUS | Status: DC
  Administered 2017-05-09 – 2017-05-14 (×10): via TOPICAL
  Administered 2017-05-14 – 2017-05-15 (×2): 1 via TOPICAL
  Administered 2017-05-15 – 2017-05-16 (×2): via TOPICAL
  Administered 2017-05-17: 1 via TOPICAL
  Administered 2017-05-17 – 2017-05-18 (×2): via TOPICAL
  Administered 2017-05-18: 1 via TOPICAL
  Administered 2017-05-19 – 2017-05-23 (×7): via TOPICAL
  Filled 2017-05-09 (×3): qty 113

## 2017-05-09 NOTE — Plan of Care (Signed)
Problem: Health Behavior/Discharge Planning: Goal: Ability to manage health-related needs will improve Outcome: Not Met (add Reason) Patient not competent to make healthcare decisions per Psych.

## 2017-05-09 NOTE — Progress Notes (Signed)
PROGRESS NOTE  [name] JJH:[id number] DOB: July 31, 1951 DOA: 04/24/2017 PCP: No primary care provider on file.   LOS: 15 days  No significant change in [place] plan, awaiting discharge back to facility.  Brief Narrative / Interim history:  66 year old male with past mental history of chronic respiratory failure with chronic tracheostomy, chronic diastolic CHF and COPD presented to the emergency room with worsening weakness. Patient states that he had been lying on the couch unable to get up. Patient was brought in and found to have acute kidney injury and in sepsis secondary to UTI with pyuria. Started on IV fluids and antibiotics patient has improved clinically, and he was transitioned to p.o. antibiotics on 8/25.  Has been having intermittent impulsive behavior in the hospital, and occasionally being confused.  Psychiatry was consulted and evaluated patient on 8/24, and patient was deemed incompetent to make medical decisions.  Social work consulted for help with guardianship as patient has no family and no POA.  This is main issue, clinically has been stable for several days.  Assessment & Plan: Sepsis due to UTI, resolved -He met criteria for sepsis on admission with lactic acid elevation, white count, tachycardia - urine cultures were positive for Morganella -mental status improved and now he appears close to baseline. - Treated with IV ceftriaxone initially from 8/19 to 8/25 and transitioned to Cipro. Completed treatment.  Acute encephalopathy with underlying dementia Patient was getting agitated pulling on trach and IV line.  No behavioral disturbances since last Wednesday. Telesitter has been discontinued, since not indicated. -May have some degree of confusion dementia at baseline per his friend -TSH was normal -Have asked psychiatry to evaluate, patient incompetent to make medical decisions for himself -He can answer orientation questions for most part but he is intermittently  confused and unable to grasp his medical issues.  Not Competent to make decisions. -SW involved for guardianship assistance  Acute kidney injury with underlying chronic kidney disease stage II -Creatinine now stable  Morbid obesity  Obesity hypoventilation syndrome/obstructive sleep apnea with chronic hypoxic and hypercarbic respiratory failure -Respiratory status appears stable, he has tracheostomy in place, however patient attempted to self discontinue the trach 8/23 - No further attempt by patient due to his trach since then. Cooperating with [place] care. Adding flutter valve to continue pulmonary toilet.  Chronic diastolic CHF -appears euvolemic  Type 2 diabetes mellitus -Diet-controlled, A1c 7.3, continue sliding scale for now  Hyponatremia -Resolved  Moisture associated skin damage and intertriginous dermatitis -Appreciate wound care help. IV Diflucan 2 doses. Dressings as per wound care.  Hypertension -Continue hydralazine, currently controlled  Tracheostomy status. Respiratory therapist consulted for trach care. Speech therapy consulted for providing Passy-Muir valve. Discontinue continuous pulse ox.  DVT prophylaxis: heparin Code Status: Full code Family Communication: No family at bedside Disposition Plan: TBD. SW involved for guardianship  Consultants:   Psychiatry   Procedures:   None   Antimicrobials:  Ceftriaxone 8/19 >> 8/25  Ciprofloxacin 8/25 >> plan for end date 8/28 (10 total days)  Subjective: No acute complaint at present. Feeling better. Breathing better.  Objective: Vitals:   05/09/17 0414 05/09/17 0459 05/09/17 0820 05/09/17 0911  BP: (!) 103/51   114/66  Pulse: 79 72 79   Resp: '18 16 18   '$ Temp: 98.7 F (37.1 C)     TempSrc: Oral     SpO2: 97% 94% 99%   Weight: 128.6 kg (283 lb 8.2 oz)     Height:  Intake/Output Summary (Last 24 hours) at 05/09/17 1049 Last data filed at 05/09/17 0909  Gross per 24 hour  Intake               480 ml  Output             1325 ml  Net             -845 ml   Filed Weights   05/06/17 0457 05/08/17 0500 05/09/17 0414  Weight: 129.7 kg (285 lb 15 oz) 128.9 kg (284 lb 2.8 oz) 128.6 kg (283 lb 8.2 oz)    Examination:  Vitals:   05/09/17 0414 05/09/17 0459 05/09/17 0820 05/09/17 0911  BP: (!) 103/51   114/66  Pulse: 79 72 79   Resp: '18 16 18   '$ Temp: 98.7 F (37.1 C)     TempSrc: Oral     SpO2: 97% 94% 99%   Weight: 128.6 kg (283 lb 8.2 oz)     Height:        General: Appear in no distress, no Rash; Oral Mucosa moist. no Abnormal Mass Or lumps Cardiovascular: S1 and S2 Present, no Murmur, Respiratory: normal respiratory effort, Bilateral Air entry present and basal Crackles, no wheezes Abdomen: Bowel Sound present, Soft and no tenderness,  Extremities: bilateral Pedal edema, no calf tenderness Neurology: Alert, Awake and Oriented to Time, Place and Person. affect appropriatee. Grossly no focal neuro deficit.   Data Reviewed: I have independently reviewed following labs and imaging studies   CBC:  Recent Labs Lab 05/08/17 0516  WBC 7.5  HGB 11.1*  HCT 34.6*  MCV 84.4  PLT 562   Basic Metabolic Panel:  Recent Labs Lab 05/08/17 0516  NA 137  K 3.6  CL 105  CO2 26  GLUCOSE 102*  BUN 14  CREATININE 1.37*  CALCIUM 8.9   GFR: Estimated Creatinine Clearance: 75.5 mL/min (A) (by C-G formula based on SCr of 1.37 mg/dL (H)). Liver Function Tests: No results for input(s): AST, ALT, ALKPHOS, BILITOT, PROT, ALBUMIN in the last 168 hours. No results for input(s): LIPASE, AMYLASE in the last 168 hours. No results for input(s): AMMONIA in the last 168 hours. Coagulation Profile: No results for input(s): INR, PROTIME in the last 168 hours. Cardiac Enzymes: No results for input(s): CKTOTAL, CKMB, CKMBINDEX, TROPONINI in the last 168 hours. BNP (last 3 results) No results for input(s): PROBNP in the last 8760 hours. HbA1C: No results for input(s): HGBA1C  in the last 72 hours. CBG:  Recent Labs Lab 05/08/17 0749 05/08/17 1152 05/08/17 1650 05/08/17 2056 05/09/17 0731  GLUCAP 95 108* 130* 102* 102*   Lipid Profile: No results for input(s): CHOL, HDL, LDLCALC, TRIG, CHOLHDL, LDLDIRECT in the last 72 hours. Thyroid Function Tests: No results for input(s): TSH, T4TOTAL, FREET4, T3FREE, THYROIDAB in the last 72 hours. Anemia Panel: No results for input(s): VITAMINB12, FOLATE, FERRITIN, TIBC, IRON, RETICCTPCT in the last 72 hours. Urine analysis:    Component Value Date/Time   COLORURINE YELLOW 04/24/2017 2001   APPEARANCEUR CLOUDY (A) 04/24/2017 2001   LABSPEC 1.009 04/24/2017 2001   PHURINE 8.0 04/24/2017 2001   GLUCOSEU NEGATIVE 04/24/2017 2001   HGBUR LARGE (A) 04/24/2017 2001   BILIRUBINUR NEGATIVE 04/24/2017 2001   [place] 04/24/2017 2001   PROTEINUR 100 (A) 04/24/2017 2001   UROBILINOGEN 1.0 06/17/2015 1407   NITRITE POSITIVE (A) 04/24/2017 2001   LEUKOCYTESUR LARGE (A) 04/24/2017 2001   Sepsis Labs: Invalid input(s): PROCALCITONIN, LACTICIDVEN  No results  found for this or any previous visit (from the past 240 hour(s)).    Radiology Studies: No results found.   Scheduled Meds: . chlorhexidine  15 mL Mouth Rinse BID  . ciprofloxacin  500 mg Oral BID  . docusate sodium  100 mg Oral BID  . fluticasone  2 spray Each Nare Daily  . Gerhardt's butt cream   Topical BID  . heparin subcutaneous  5,000 Units Subcutaneous Q8H  . hydrALAZINE  37.5 mg Oral BID  . hydrocerin   Topical BID  . insulin aspart  0-5 Units Subcutaneous QHS  . insulin aspart  0-9 Units Subcutaneous TID WC  . mouth rinse  15 mL Mouth Rinse q12n4p  . sodium chloride flush  3 mL Intravenous Q12H   Continuous Infusions:   Author:  Berle Mull, MD [facility] Pager: [phone] 05/09/2017 10:49 AM     If 7PM-7AM, please contact night-coverage [email or web address] Password [facility] 05/09/2017, 10:49 AM

## 2017-05-09 NOTE — Progress Notes (Signed)
RT in to check Trach/pt. status at this time, airway check delayed due to Ventilator placed on pt. in ED, RT to monitor.

## 2017-05-10 LAB — GLUCOSE, CAPILLARY
GLUCOSE-CAPILLARY: 104 mg/dL — AB (ref 65–99)
Glucose-Capillary: 101 mg/dL — ABNORMAL HIGH (ref 65–99)
Glucose-Capillary: 101 mg/dL — ABNORMAL HIGH (ref 65–99)
Glucose-Capillary: 92 mg/dL (ref 65–99)

## 2017-05-10 MED ORDER — GUAIFENESIN-DM 100-10 MG/5ML PO SYRP
5.0000 mL | ORAL_SOLUTION | Freq: Three times a day (TID) | ORAL | Status: DC
  Administered 2017-05-10 – 2017-05-25 (×46): 5 mL via ORAL
  Filled 2017-05-10 (×45): qty 10

## 2017-05-10 MED ORDER — BENZONATATE 100 MG PO CAPS
100.0000 mg | ORAL_CAPSULE | Freq: Three times a day (TID) | ORAL | Status: DC
  Administered 2017-05-10 – 2017-05-25 (×46): 100 mg via ORAL
  Filled 2017-05-10 (×46): qty 1

## 2017-05-10 MED ORDER — MENTHOL 3 MG MT LOZG
1.0000 | LOZENGE | OROMUCOSAL | Status: DC | PRN
  Administered 2017-05-23: 3 mg via ORAL
  Filled 2017-05-10 (×2): qty 9

## 2017-05-10 MED ORDER — ZINC OXIDE 40 % EX OINT
TOPICAL_OINTMENT | Freq: Three times a day (TID) | CUTANEOUS | Status: DC
  Administered 2017-05-10 – 2017-05-11 (×4): 57 via TOPICAL
  Administered 2017-05-11 – 2017-05-14 (×7): via TOPICAL
  Administered 2017-05-14: 1 via TOPICAL
  Administered 2017-05-15 (×2): via TOPICAL
  Administered 2017-05-15: 1 via TOPICAL
  Administered 2017-05-16 – 2017-05-18 (×4): via TOPICAL
  Administered 2017-05-18: 1 via TOPICAL
  Administered 2017-05-21 – 2017-05-23 (×2): via TOPICAL
  Filled 2017-05-10: qty 57

## 2017-05-10 NOTE — Progress Notes (Signed)
WOC completed to follow up on area of buttock slow to heal and slight bleeding noted at times. Will cont to monitor. SRP, RN

## 2017-05-10 NOTE — Progress Notes (Signed)
CSW [facility] member Carollee HerterShannon to inquire about their ability to offer patient a bed with 30 day LOG. Staff reported that she had not heard back from APS and that she would need to speak with APS first before being able to offer a bed. Staff agreed to follow up with APS worker tomorrow and provide CSW with an update.  CSW Mordecai RasmussenHannah Coble contacted patient's APS worker Salome Spottedia Turner and inquired about patient's APS case. APS worker reported that they are still doing a diligent search to see if patient has any family that can be a guardian to the patient. She reported that they have not petitioned for guardianship at this time and are agreeable with patient discharging to SNF if that is the most appropriate place for patient to be.   CSW contacted staff member Carollee HerterShannon [facility] and provided with update about APS. Staff reported that she would have to run the case by her boss tomorrow to see if she is able to offer a bed to patient.   CSW contacted CSW ChiropodistAssistant Director and staffed case. CSW ChiropodistAssistant Director advised CSW to check with patient's doctor to see if patient was oriented and could sign himself into SNF. CSW ChiropodistAssistant Director reported that he would sign patient in if needed.   CSW contacted [facility] and spoke with staff member Gala RomneyDoug to see if they were able to offer patient a bed. Staff reported that he was going to review patient on the hub and contact his administrator. CSW awaiting return phone call.   Celso SickleKimberly Kiyan Burmester, [place]WA Clinical Social Worker [facility] Cell#: [phone]

## 2017-05-10 NOTE — Progress Notes (Signed)
Physical Therapy Treatment Patient Details Name: [name] MRN: [id number] DOB: 1951-06-22 Today's Date: 05/10/2017    History of Present Illness  [name] is a 66 y.o. male with medical history significant for COPD, chronic diastolic CHF, hypertension, morbid obesity, chronic kidney disease stage II, and OSA, into the emergency department for evaluation of generalized weakness--per chart pt has been too weak to get off the couch and has been urinating and defecating on himself for days.  Diagnosed with sepsis due to UTI, acute encephalopathy    PT Comments    Pt cooperative and agreeable to PT today; incr pt effort/decr assist needed this session; continue to recommend SNF  Follow Up Recommendations  SNF     Equipment Recommendations  None recommended by PT    Recommendations for Other Services       Precautions / Restrictions Precautions Precautions: Fall Precaution Comments: chronic trach with PMV Restrictions Weight Bearing Restrictions: No    Mobility  Bed Mobility   Bed Mobility: Supine to Sit     Supine to sit: Supervision     General bed mobility comments: for safety, incr time  Transfers Overall transfer level: Needs assistance Equipment used: Rolling walker (2 wheeled) Transfers: Sit to/from Stand Sit to Stand: Min assist;Min guard         General transfer comment: cues for hand placement, safety and to control descent; incr time, bed not elevated  Ambulation/Gait             General Gait Details: pt only agreeable to transfer   Stairs            Wheelchair Mobility    Modified Rankin (Stroke Patients Only)       Balance                                            Cognition Arousal/Alertness: Awake/alert Behavior During Therapy: WFL for tasks assessed/performed Overall Cognitive Status: Within Functional Limits for tasks assessed                                 General Comments:  inappropriate comments at times, pt easily redirected      Exercises      General Comments        Pertinent Vitals/Pain Pain Assessment: Faces Faces Pain Scale: Hurts a little bit Pain Location: LEs with movement Pain Descriptors / Indicators: Sore;Grimacing Pain Intervention(s): Monitored during session    Home Living                      Prior Function            PT Goals ([place] goals can now be found in the care plan section) Acute Rehab PT Goals Patient Stated Goal: none stated PT Goal Formulation: With patient Time For Goal Achievement: 05/21/17 Potential to Achieve Goals: Good Progress towards PT goals: Progressing toward goals    Frequency    Min 2X/week      PT Plan Bo plan remains appropriate    Co-evaluation              AM-PAC PT "6 Clicks" Daily Activity  Outcome Measure  Difficulty turning over in bed (including adjusting bedclothes, sheets and blankets)?: A Little Difficulty moving from lying on back to sitting on the  side of the bed? : A Little Difficulty sitting down on and standing up from a chair with arms (e.g., wheelchair, bedside commode, etc,.)?: A Little Help needed moving to and from a bed to chair (including a wheelchair)?: A Lot Help needed walking in hospital room?: A Lot Help needed climbing 3-5 steps with a railing? : A Lot 6 Click Score: 15    End of Session Equipment Utilized During Treatment: Gait belt Activity Tolerance: Patient tolerated treatment well Patient left: in chair;with call bell/phone within reach;with chair alarm set Nurse Communication: Mobility status PT Visit Diagnosis: Muscle weakness (generalized) (M62.81);Difficulty in walking, not elsewhere classified (R26.2)     Time: [id number] PT Time Calculation (min) (ACUTE ONLY): 21 min  Charges:  $Therapeutic Activity: 8-22 mins                    G Codes:           [name] 05/10/2017, 1:36 PM

## 2017-05-10 NOTE — Progress Notes (Signed)
Patient refused application of Eucerin cream for tonight, educated about the importance of routinely applying cream on the affected area pt states " just leave them alone right now " . We will continue to monitor

## 2017-05-10 NOTE — Consult Note (Addendum)
WOC Nurse wound follow up Wound type: Full thickness areas on buttocks are improved, but not healed.  LEs are improved.  Medial thighs are improved. Measurement: Two remaining lesions on right buttock: Proximal: 1.5cm x 3cm x 0.2cm with pink, moist wound bed with basement cell membrane evident Distal:  0.5cm x 4cm x 0.2cm with red, moist wound bed and 50% of wound bed with basement cell membrane evident. Fungus appears to have resolved. Wound bed:As described above Drainage (amount, consistency, odor) scant serous Periwound: intact with evidence of wound healing Dressing procedure/placement/frequency: Patient is still on a mattress replacement with low air loss feature and is becoming more mobile. A pressure redistribution chair pad is provided for when he is OOB in chair.  We will change the Dr. Dirk DressGerhart's Butt Cream for 40% zinc oxide (Desitin) today and see if we can further and complete wound healing and tissue regeneration.   WOC nursing team will not follow, but will remain available to this patient, the nursing and medical teams.  Please re-consult if needed. Thanks, Ladona MowLaurie Silveria Botz, MSN, RN, GNP, Hans EdenCWOCN, CWON-AP, FAAN  Pager# [phone]

## 2017-05-10 NOTE — Progress Notes (Signed)
Pt does not want to be suctioned at this time

## 2017-05-10 NOTE — Progress Notes (Addendum)
[facility] PROGRESS NOTE  Patient: [name] Fatzinger ZOX:[id number]   PCP: No primary care provider on file. DOB: [birth year]   DOA: 04/24/2017   DOS: 05/10/2017    Subjective: no acute complains   Objective:  Vitals:   05/10/17 1136 05/10/17 1223  BP:  94/75  Pulse: 76 79  Resp: 18 18  Temp:  98 F (36.7 C)  SpO2: 97% 97%    Clear to auscultation   Assessment and plan: Continue [place] care, Awaiting transfer to SNF  Cough  Add tessalon Perles, Cepacol lozenges, robitussin already ordered  Author: Lynden OxfordPranav Dajana Gehrig, MD [facility] Pager: [phone] 05/10/2017 4:09 PM   If 7PM-7AM, please contact night-coverage at [email or web address], password [facility]

## 2017-05-11 LAB — GLUCOSE, CAPILLARY
GLUCOSE-CAPILLARY: 86 mg/dL (ref 65–99)
Glucose-Capillary: 108 mg/dL — ABNORMAL HIGH (ref 65–99)
Glucose-Capillary: 101 mg/dL — ABNORMAL HIGH (ref 65–99)
Glucose-Capillary: 107 mg/dL — ABNORMAL HIGH (ref 65–99)

## 2017-05-11 NOTE — Progress Notes (Signed)
Nutrition Follow-up  DOCUMENTATION CODES:   Morbid obesity  INTERVENTION:   Magic cup TID with meals, each supplement provides 290 kcal and 9 grams of protein  NUTRITION DIAGNOSIS:   Increased nutrient needs related to wound healing as evidenced by estimated needs.  Ongoing  GOAL:   Patient will meet greater than or equal to 90% of their needs  Meeting  MONITOR:   PO intake, Supplement acceptance, Labs, Skin  REASON FOR ASSESSMENT:   Low Braden    ASSESSMENT:   Pt with PMH of CHF, COPD, HLD, CKD II, and HTN. Complains of increased weakness over the past few months. Reports over the past two weeks he has been immobile laying on a couch incontinent of stool and urine. Presents this admission with AKI superimposed on CKD II.   Pt in pleasant mood today and reports he is feeling much better. Appetite is progressing back to baseline, consuming 64% of last eight meals. Pt enjoys magic cups and would like to continue with them. Wt noted to be stable. Nursing spoke with pt about the importance of protein after discharge. RD reiterated. Pt to d/c to SNF possibly tomorrow.   Medications reviewed and include: SSI Labs reviewed: CBG 92-130  Diet Order:  Diet heart healthy/carb modified Room service appropriate? Yes; Fluid consistency: Thin  Skin:   (stg III buttocks stg II buttocks)  Last BM:  05/10/17  Height:   Ht Readings from Last 1 Encounters:  05/03/17 6' (1.829 m)    Weight:   Wt Readings from Last 1 Encounters:  05/10/17 283 lb 11.7 oz (128.7 kg)    Ideal Body Weight:  78.2 kg  BMI:  Body mass index is 38.48 kg/m.  Estimated Nutritional Needs:   Kcal:  2155 (MSJ)  Protein:  110-120 grams (1.4-1.5 g/kg IBW)  Fluid:  >2.1 L/day  EDUCATION NEEDS:   No education needs identified at this time  Vanessa Kickarly Shaye Elling RD, LDN Clinical Nutrition Pager # - [phone]

## 2017-05-11 NOTE — Progress Notes (Signed)
CSW followed up with staff member Doug from [facility] about ability to offer patient a bed. Staff reported that they are unable to offer patient a bed.   CS[facility] staff member Thayer OhmChris and inquired about patient's referral. Staff agreed to review and contact CSW with an update.   [facility] contacted [facility] and spoke with staff member Aggie Cosierheresa regarding patient's referral. She reported that they are unable to accept a LOG if the patient does not have insurance.  CSW contacted [facility] and spoke with staff member Velna HatchetSheila regarding patient's referral. She reported that they would not be able to accept patient with a LOG unless the patient has insurance or has applied for Medicaid. CSW agreed to update staff after speaking with APS worker.    CSW contacted patient's APS worker Salome Spottedia Turner [phone]) and inquired about update on patient's case. APS worker reported that they are going to petition for guardianship of patient and that patient will have an interim guardian. CSW informed APS worker that patient is still at the hospital and that patient does not have any bed offers. APS worker reported that she was going to speak with her supervisor to see if they could do anything to assist with the process of finding patient a SNF. APS worker reported that she is not aware if a medicaid application has been started for the patient. APS worker agreed to contact CSW with an update after speaking with her supervisor.   CSW continuing to search for SNF bed for patient.   Celso SickleKimberly Rainn Bullinger, [place] Clinical Social Worker [facility] Cell#: [phone]

## 2017-05-11 NOTE — Progress Notes (Signed)
PROGRESS NOTE  [name] OEV:[id number] DOB: Feb 22, 1951 DOA: 04/24/2017 PCP: No primary care provider on file.   LOS: 17 days  No significant change in [place] plan, awaiting discharge to facility.  Brief Narrative / Interim history:  66 year old male with past mental history of chronic respiratory failure with chronic tracheostomy, chronic diastolic CHF and COPD presented to the emergency room with worsening weakness. Patient states that he had been lying on the couch unable to get up. Patient was brought in and found to have acute kidney injury and in sepsis secondary to UTI with pyuria. Started on IV fluids and antibiotics patient has improved clinically, and he was transitioned to p.o. antibiotics on 8/25.  Has been having intermittent impulsive behavior in the hospital, and occasionally being confused.  Psychiatry was consulted and evaluated patient on 8/24, and patient was deemed incompetent to make medical decisions.  Social work consulted for help with guardianship as patient has no family and no POA.  This is main issue, clinically has been stable for several days.  Assessment & Plan: Sepsis due to UTI, resolved -He met criteria for sepsis on admission with lactic acid elevation, white count, tachycardia - urine cultures were positive for Morganella -mental status improved and now he appears close to baseline. - Treated with IV ceftriaxone initially from 8/19 to 8/25 and transitioned to Cipro. Completed treatment.  Acute encephalopathy with underlying dementia Patient was getting agitated pulling on trach and IV line.  No behavioral disturbances since last Wednesday. Telesitter has been discontinued, since not indicated. -May have some degree of confusion dementia at baseline per his friend -TSH was normal -Have asked psychiatry to evaluate, patient incompetent to make medical decisions for himself -He can answer orientation questions for most part but he is intermittently  confused and unable to grasp his medical issues.  Not Competent to make decisions. -SW involved for guardianship assistance  Acute kidney injury with underlying chronic kidney disease stage II -Creatinine now stable  Morbid obesity  Obesity hypoventilation syndrome/obstructive sleep apnea with chronic hypoxic and hypercarbic respiratory failure -Respiratory status appears stable, he has tracheostomy in place, however patient attempted to self discontinue the trach 8/23 - No further attempt by patient due to his trach since then. Cooperating with [facility] care. Therapist recommended not to use flutter valve with his trach. Recommend therapist to perform more pulmonary toilet.  Chronic diastolic CHF -appears euvolemic  Type 2 diabetes mellitus -Diet-controlled, A1c 7.3, continue sliding scale for now  Hyponatremia -Resolved  Moisture associated skin damage and intertriginous dermatitis -Appreciate wound care help. IV Diflucan 2 doses. Dressings as per wound care.  Hypertension -Continue hydralazine, currently controlled  Tracheostomy status. Respiratory therapist consulted for trach care. Speech therapy consulted for providing Passy-Muir valve. Discontinue continuous pulse ox.  Drowsiness. After receiving Benadryl. No further change in plan recommended for now.  DVT prophylaxis: heparin Code Status: Full code Family Communication: No family at bedside Disposition Plan: TBD. SW involved for guardianship  Consultants:   Psychiatry   Procedures:   None   Antimicrobials:  Ceftriaxone 8/19 >> 8/25  Ciprofloxacin 8/25 >> plan for end date 8/28 (10 total days)  Subjective: No acute complaint at present. Feeling better. Breathing better.  Objective: Vitals:   05/11/17 0343 05/11/17 0549 05/11/17 1436 05/11/17 1502  BP:  124/74 128/73   Pulse: 82 72 69   Resp: '16 20 16   '$ Temp:  98.1 F (36.7 C) 98 F (36.7 C)   TempSrc:  Oral Oral  SpO2: 96% 98% 100% 98%    Weight:      Height:        Intake/Output Summary (Last 24 hours) at 05/11/17 1831 Last data filed at 05/11/17 1500  Gross per 24 hour  Intake              383 ml  Output             1120 ml  Net             -737 ml   Filed Weights   05/08/17 0500 05/09/17 0414 05/10/17 0529  Weight: 128.9 kg (284 lb 2.8 oz) 128.6 kg (283 lb 8.2 oz) 128.7 kg (283 lb 11.7 oz)    Examination:  Vitals:   05/11/17 0343 05/11/17 0549 05/11/17 1436 05/11/17 1502  BP:  124/74 128/73   Pulse: 82 72 69   Resp: '16 20 16   '$ Temp:  98.1 F (36.7 C) 98 F (36.7 C)   TempSrc:  Oral Oral   SpO2: 96% 98% 100% 98%  Weight:      Height:        General: Appear in no distress, no Rash; Oral Mucosa moist. no Abnormal Mass Or lumps Cardiovascular: S1 and S2 Present, no Murmur, Respiratory: normal respiratory effort, Bilateral Air entry present and basal Crackles, no wheezes Abdomen: Bowel Sound present, Soft and no tenderness,  Extremities: bilateral Pedal edema, no calf tenderness Neurology: Alert, Awake and Oriented to Time, Place and Person. affect appropriatee. Grossly no focal neuro deficit.   Data Reviewed: I have independently reviewed following labs and imaging studies   CBC:  Recent Labs Lab 05/08/17 0516  WBC 7.5  HGB 11.1*  HCT 34.6*  MCV 84.4  PLT 765   Basic Metabolic Panel:  Recent Labs Lab 05/08/17 0516  NA 137  K 3.6  CL 105  CO2 26  GLUCOSE 102*  BUN 14  CREATININE 1.37*  CALCIUM 8.9   GFR: Estimated Creatinine Clearance: 75.5 mL/min (A) (by C-G formula based on SCr of 1.37 mg/dL (H)). Liver Function Tests: No results for input(s): AST, ALT, ALKPHOS, BILITOT, PROT, ALBUMIN in the last 168 hours. No results for input(s): LIPASE, AMYLASE in the last 168 hours. No results for input(s): AMMONIA in the last 168 hours. Coagulation Profile: No results for input(s): INR, PROTIME in the last 168 hours. Cardiac Enzymes: No results for input(s): CKTOTAL, CKMB, CKMBINDEX,  TROPONINI in the last 168 hours. BNP (last 3 results) No results for input(s): PROBNP in the last 8760 hours. HbA1C: No results for input(s): HGBA1C in the last 72 hours. CBG:  Recent Labs Lab 05/10/17 1643 05/10/17 2221 05/11/17 0739 05/11/17 1228 05/11/17 1745  GLUCAP 101* 92 108* 101* 86   Lipid Profile: No results for input(s): CHOL, HDL, LDLCALC, TRIG, CHOLHDL, LDLDIRECT in the last 72 hours. Thyroid Function Tests: No results for input(s): TSH, T4TOTAL, FREET4, T3FREE, THYROIDAB in the last 72 hours. Anemia Panel: No results for input(s): VITAMINB12, FOLATE, FERRITIN, TIBC, IRON, RETICCTPCT in the last 72 hours. Urine analysis:    Component Value Date/Time   COLORURINE YELLOW 04/24/2017 2001   APPEARANCEUR CLOUDY (A) 04/24/2017 2001   LABSPEC 1.009 04/24/2017 2001   PHURINE 8.0 04/24/2017 2001   GLUCOSEU NEGATIVE 04/24/2017 2001   HGBUR LARGE (A) 04/24/2017 2001   BILIRUBINUR NEGATIVE 04/24/2017 2001   KETONESUR NEGATIVE 04/24/2017 2001   PROTEINUR 100 (A) 04/24/2017 2001   UROBILINOGEN 1.0 06/17/2015 1407   NITRITE POSITIVE (A) 04/24/2017 2001  LEUKOCYTESUR LARGE (A) 04/24/2017 2001   Sepsis Labs: Invalid input(s): PROCALCITONIN, LACTICIDVEN  No results found for this or any previous visit (from the past 240 hour(s)).    Radiology Studies: No results found.   Scheduled Meds: . benzonatate  100 mg Oral TID  . chlorhexidine  15 mL Mouth Rinse BID  . docusate sodium  100 mg Oral BID  . fluticasone  2 spray Each Nare Daily  . guaiFENesin-dextromethorphan  5 mL Oral TID  . heparin subcutaneous  5,000 Units Subcutaneous Q8H  . hydrALAZINE  37.5 mg Oral BID  . hydrocerin   Topical BID  . insulin aspart  0-5 Units Subcutaneous QHS  . insulin aspart  0-9 Units Subcutaneous TID WC  . liver oil-zinc oxide   Topical TID  . mouth rinse  15 mL Mouth Rinse q12n4p  . sodium chloride flush  3 mL Intravenous Q12H   Continuous Infusions:   Author:  Berle Mull, MD [facility] Pager: [phone] 05/11/2017 6:31 PM     If 7PM-7AM, please contact night-coverage [email or web address] Password [facility] 05/11/2017, 6:31 PM

## 2017-05-11 NOTE — Progress Notes (Signed)
CSW following pt for discharge to SNF.

## 2017-05-11 NOTE — Progress Notes (Signed)
CSW informed by staff member Thayer OhmChris that CSW can contact staff member Rayfield CitizenCaroline [phone]) from [facility] from an update.   CSW contacted staff member Rayfield CitizenCaroline in regards to their ability to offer patient a bed. Staff reported that she is waiting to speak with administration regarding their SNFs ability to offer patient a bed. Staff agreed to contact CSW with an update.   CSW awaiting return phone call.  Celso SickleKimberly Rosia Syme, [place] Clinical Social Worker [facility] Cell#: [phone]

## 2017-05-11 NOTE — Progress Notes (Signed)
Physical Therapy Treatment Patient Details Name: [name] MRN: [id number] DOB: 1951/08/28 Today's Date: 05/11/2017    History of Present Illness  [name] is a 65 y.o. male with medical history significant for COPD, chronic diastolic CHF, hypertension, morbid obesity, chronic kidney disease stage II, and OSA, into the emergency department for evaluation of generalized weakness--per chart pt has been too weak to get off the couch and has been urinating and defecating on himself for days.  Diagnosed with sepsis due to UTI, acute encephalopathy    PT Comments    Continue to recommend SNF   Follow Up Recommendations  SNF     Equipment Recommendations  None recommended by PT    Recommendations for Other Services       Precautions / Restrictions Precautions Precautions: Fall Precaution Comments: chronic trach with PMV Restrictions Weight Bearing Restrictions: No    Mobility  Bed Mobility Overal bed mobility: Needs Assistance Bed Mobility: Supine to Sit;Sit to Supine     Supine to sit: Supervision Sit to supine: Min guard   General bed mobility comments: for safety, incr time  Transfers Overall transfer level: Needs assistance Equipment used: Rolling walker (2 wheeled) Transfers: Sit to/from Stand Sit to Stand: Min assist;Min guard         General transfer comment: cues for hand placement, safety and to control descent; incr time, bed not elevated  Ambulation/Gait Ambulation/Gait assistance: Min assist Ambulation Distance (Feet): 2 Feet Assistive device: Rolling walker (2 wheeled)       General Gait Details: lateral steps along EOB only, pt not agreeable to chair or furhter amb    Stairs            Wheelchair Mobility    Modified Rankin (Stroke Patients Only)       Balance   Sitting-balance support: Single extremity supported;Bilateral upper extremity supported;Feet supported Sitting balance-Leahy Scale: Good Sitting balance - Comments:  able to wt shift laterally to adjust gown     Standing balance-Leahy Scale: Poor                              Cognition Arousal/Alertness: Awake/alert Behavior During Therapy: WFL for tasks assessed/performed Overall Cognitive Status: Within Functional Limits for tasks assessed                                 General Comments: tangential at times      Exercises      General Comments        Pertinent Vitals/Pain Pain Assessment: No/denies pain    Home Living                      Prior Function            PT Goals ([place] goals can now be found in the care plan section) Acute Rehab PT Goals Patient Stated Goal: none stated PT Goal Formulation: With patient Time For Goal Achievement: 05/21/17 Potential to Achieve Goals: Good Progress towards PT goals: Progressing toward goals    Frequency    Min 2X/week      PT Plan [place] plan remains appropriate    Co-evaluation              AM-PAC PT "6 Clicks" Daily Activity  Outcome Measure  Difficulty turning over in bed (including adjusting bedclothes, sheets and blankets)?: A Little Difficulty  moving from lying on back to sitting on the side of the bed? : A Little Difficulty sitting down on and standing up from a chair with arms (e.g., wheelchair, bedside commode, etc,.)?: A Little Help needed moving to and from a bed to chair (including a wheelchair)?: A Lot Help needed walking in hospital room?: A Lot Help needed climbing 3-5 steps with a railing? : A Lot 6 Click Score: 15    End of Session   Activity Tolerance: Patient tolerated treatment well Patient left: in bed;with call bell/phone within reach Nurse Communication: Mobility status PT Visit Diagnosis: Muscle weakness (generalized) (M62.81);Difficulty in walking, not elsewhere classified (R26.2)     Time: 1202-1224 PT Time Calculation (min) (ACUTE ONLY): 22 min  Charges:  $Therapeutic Activity: 8-22 mins                     G Codes:          Emalie Mcwethy 05/11/2017, 12:27 PM

## 2017-05-12 LAB — BASIC METABOLIC PANEL
ANION GAP: 10 (ref 5–15)
BUN: 15 mg/dL (ref 6–20)
CALCIUM: 9.2 mg/dL (ref 8.9–10.3)
CO2: 23 mmol/L (ref 22–32)
Chloride: 105 mmol/L (ref 101–111)
Creatinine, Ser: 1.56 mg/dL — ABNORMAL HIGH (ref 0.61–1.24)
GFR, EST AFRICAN AMERICAN: 52 mL/min — AB (ref >60)
GFR, EST NON AFRICAN AMERICAN: 45 mL/min — AB (ref >60)
GLUCOSE: 104 mg/dL — AB (ref 65–99)
POTASSIUM: 3.6 mmol/L (ref 3.5–5.1)
Sodium: 138 mmol/L (ref 135–145)

## 2017-05-12 LAB — CBC
HEMATOCRIT: 37.1 % — AB (ref 39.0–52.0)
HEMOGLOBIN: 12.2 g/dL — AB (ref 13.0–17.0)
MCH: 28 pg (ref 26.0–34.0)
MCHC: 32.9 g/dL (ref 30.0–36.0)
MCV: 85.3 fL (ref 78.0–100.0)
Platelets: 278 10*3/uL (ref 150–400)
RBC: 4.35 MIL/uL (ref 4.22–5.81)
RDW: 21.3 % — ABNORMAL HIGH (ref 11.5–15.5)
WBC: 7.4 10*3/uL (ref 4.0–10.5)

## 2017-05-12 LAB — GLUCOSE, CAPILLARY
GLUCOSE-CAPILLARY: 98 mg/dL (ref 65–99)
GLUCOSE-CAPILLARY: 114 mg/dL — AB (ref 65–99)
Glucose-Capillary: 103 mg/dL — ABNORMAL HIGH (ref 65–99)
Glucose-Capillary: 153 mg/dL — ABNORMAL HIGH (ref 65–99)

## 2017-05-12 NOTE — Progress Notes (Signed)
Trach care done per RRT. Pt IC was changed and new IC was inserted and locked back into place. No distress or complications noted. Pt tolerated it well. PMV in place

## 2017-05-12 NOTE — Progress Notes (Addendum)
PROGRESS NOTE                                                                                                                                                                                                             Patient Demographics:    [name], is a 66 y.o. male, DOB - 1950-12-02, RUE:[id number]  Admit date - 04/24/2017   Admitting Physician Briscoe Deutscher, MD  Outpatient Primary MD for the patient is No primary care provider on file.  LOS - 18    Chief Complaint  Patient presents with  . Weakness       Brief Narrative  66 year old male with chronic respiratory failure, tracheostomy status, chronic diastolic CHF and COPD presented with weakness and being unable to get a from his couch at home. Patient was found to have sepsis secondary to UTI and acute kidney injury. Patient completed antibiotics on 8/25. Patient also having intermittent impulsive behavior in the hospital with episodic confusion. Patient evaluated by psychiatry and deemed not competent. Hospital course prolonged due to requirement of guardianship and placement.    Subjective:   Patient denies any headache, dizziness, shortness of breath, abdominal pain or chest pain symptoms. Knows he is in the hospital.   Assessment  & Plan :   Principal problem Acute metabolic encephalopathy with underlying dementia. Deemed not competent to make complex medical decision by psychiatry. Has not shown any agitation or aggressive outbursts for past 1 week. Sitter has been discontinued. TSH normal. Social worker awaiting guardianship paperwork from APS.  Sepsis secondary to UTI Resolved. Completed antibiotic course (Rocephin followed by Cipro)  Acute on chronic kidney disease stage II Secondary to sepsis and UTI. Resolved.  Morbid obesity with OHS/OSA and chronic hypoxic/hypercapnic respiratory failure Stable. On chronic trach. His trach to be  changed by respiratory therapist today. Continue pulmonary toilet. Nutrition consult appreciated.  Chronic diastolic CHF Euvolemic  Type 2 diabetes mellitus A1c of 7.3. Continue sliding scale coverage.   Hyponatremia Secondary to dehydration. Resolved with fluids.  Essential hypertension Continue hydralazine. Stable.  Moisture associated intertriginous dermatitis Seen by wound care. Appreciate recommendation. Full-thickness areas on buttocks have improved but not completely healed.   Code Status : Full code  Family Communication  : Patient does not have any close family  Disposition Plan  : SNF  Barriers For Discharge :  Awaiting paperwork for disposition  Consults  :  Psychiatry  Procedures  : None  DVT Prophylaxis  :  Lovenox -   Lab Results  Component Value Date   PLT 278 05/12/2017    Antibiotics  :   Anti-infectives    Start     Dose/Rate Route Frequency Ordered Stop   05/01/17 0930  ciprofloxacin (CIPRO) tablet 500 mg  Status:  Discontinued     500 mg Oral 2 times daily 05/01/17 0924 05/09/17 1050   04/27/17 0900  fluconazole (DIFLUCAN) IVPB 100 mg  Status:  Discontinued     100 mg 50 mL/hr over 60 Minutes Intravenous Every 24 hours 04/27/17 0752 04/27/17 0754   04/27/17 0900  fluconazole (DIFLUCAN) IVPB 100 mg     100 mg 50 mL/hr over 60 Minutes Intravenous Every 24 hours 04/27/17 0754 04/28/17 1003   04/25/17 0315  cefTRIAXone (ROCEPHIN) 1 g in dextrose 5 % 50 mL IVPB  Status:  Discontinued     1 g 100 mL/hr over 30 Minutes Intravenous Daily at bedtime 04/25/17 0310 05/01/17 0924        Objective:   Vitals:   05/12/17 0844 05/12/17 1000 05/12/17 1130 05/12/17 1400  BP:  123/69  130/70  Pulse: (!) 59 76 87 75  Resp: 18  18 18   Temp:  97.8 F (36.6 C)  97.8 F (36.6 C)  TempSrc:  Oral  Oral  SpO2: 93%  97% 97%  Weight:      Height:        Wt Readings from Last 3 Encounters:  05/12/17 129.6 kg (285 lb 11.5 oz)  10/15/16 118.4 kg (261  lb)  09/14/16 113.4 kg (250 lb)     Intake/Output Summary (Last 24 hours) at 05/12/17 1621 Last data filed at 05/12/17 1110  Gross per 24 hour  Intake                0 ml  Output              530 ml  Net             -530 ml     Physical Exam  Gen: not in distress HEENT: Tracheostomy status, moist mucosa, supple neck Chest: clear b/l, no added sounds CVS: Normal S1 and S2, no murmurs GI: soft, NT, ND,  Musculoskeletal: warm, no edema CNS: AAOX1-2    Data Review:    CBC  Recent Labs Lab 05/08/17 0516 05/12/17 0400  WBC 7.5 7.4  HGB 11.1* 12.2*  HCT 34.6* 37.1*  PLT 264 278  MCV 84.4 85.3  MCH 27.1 28.0  MCHC 32.1 32.9  RDW 21.0* 21.3*    Chemistries   Recent Labs Lab 05/08/17 0516 05/12/17 0400  NA 137 138  K 3.6 3.6  CL 105 105  CO2 26 23  GLUCOSE 102* 104*  BUN 14 15  CREATININE 1.37* 1.56*  CALCIUM 8.9 9.2   ------------------------------------------------------------------------------------------------------------------ No results for input(s): CHOL, HDL, LDLCALC, TRIG, CHOLHDL, LDLDIRECT in the last 72 hours.  Lab Results  Component Value Date   HGBA1C 7.3 (H) 04/26/2017   ------------------------------------------------------------------------------------------------------------------ No results for input(s): TSH, T4TOTAL, T3FREE, THYROIDAB in the last 72 hours.  Invalid input(s): FREET3 ------------------------------------------------------------------------------------------------------------------ No results for input(s): VITAMINB12, FOLATE, FERRITIN, TIBC, IRON, RETICCTPCT in the last 72 hours.  Coagulation profile No results for input(s): INR, PROTIME in the last 168 hours.  No results for input(s): DDIMER in the last 72 hours.  Cardiac Enzymes No  results for input(s): CKMB, TROPONINI, MYOGLOBIN in the last 168 hours.  Invalid input(s):  CK ------------------------------------------------------------------------------------------------------------------    Component Value Date/Time   BNP 48.6 04/24/2017 2059    Inpatient Medications  Scheduled Meds: . benzonatate  100 mg Oral TID  . chlorhexidine  15 mL Mouth Rinse BID  . docusate sodium  100 mg Oral BID  . fluticasone  2 spray Each Nare Daily  . guaiFENesin-dextromethorphan  5 mL Oral TID  . heparin subcutaneous  5,000 Units Subcutaneous Q8H  . hydrALAZINE  37.5 mg Oral BID  . hydrocerin   Topical BID  . insulin aspart  0-5 Units Subcutaneous QHS  . insulin aspart  0-9 Units Subcutaneous TID WC  . liver oil-zinc oxide   Topical TID  . mouth rinse  15 mL Mouth Rinse q12n4p  . sodium chloride flush  3 mL Intravenous Q12H   Continuous Infusions: PRN Meds:.acetaminophen **OR** acetaminophen, bisacodyl, diphenhydrAMINE, hydrALAZINE, HYDROcodone-acetaminophen, ipratropium-albuterol, menthol-cetylpyridinium, ondansetron **OR** ondansetron (ZOFRAN) IV, senna-docusate  Micro Results No results found for this or any previous visit (from the past 240 hour(s)).  Radiology Reports Dg Chest 2 View  Result Date: 04/24/2017 CLINICAL DATA:  Increasing weakness EXAM: CHEST  2 VIEW COMPARISON:  03/14/2016 FINDINGS: Tracheostomy tube is again identified. Cardiac shadow is stable. The lungs are well aerated bilaterally. No focal infiltrate or sizable effusion is seen. Degenerative changes of the thoracic spine are noted. IMPRESSION: No acute abnormality seen. Electronically Signed   By: Alcide Clever M.D.   On: 04/24/2017 20:57   Dg Lumbar Spine Complete  Result Date: 04/24/2017 CLINICAL DATA:  Increasing weakness with inability to stand EXAM: LUMBAR SPINE - COMPLETE 4+ VIEW COMPARISON:  06/25/2015 FINDINGS: Five lumbar type vertebral bodies are well visualized. Vertebral body height is well maintained. Multilevel osteophytic changes are seen. Disc space narrowing is noted at  multiple levels worst at L2-3 and L3-4. Aortic calcifications are noted. An IVC filter is noted in satisfactory position. No overlying soft tissue abnormality is seen. IMPRESSION: Multilevel degenerative change without acute abnormality. Electronically Signed   By: Alcide Clever M.D.   On: 04/24/2017 20:58   Ct Head Wo Contrast  Result Date: 04/24/2017 CLINICAL DATA:  Increased weakness unable to stand EXAM: CT HEAD WITHOUT CONTRAST TECHNIQUE: Contiguous axial images were obtained from the base of the skull through the vertex without intravenous contrast. COMPARISON:  01/28/2015 FINDINGS: Brain: No acute territorial infarction, hemorrhage, or intracranial mass is seen. Mild small vessel ischemic changes of the white matter. Stable ventricle size. Mild atrophy. Vascular: No hyperdense vessels.  Carotid artery calcifications. Skull: No fracture or suspicious bone lesion. Sinuses/Orbits: No acute finding. Other: None IMPRESSION: No CT evidence for acute intracranial abnormality. Small vessel ischemic changes of the white matter and mild atrophy Electronically Signed   By: Jasmine Pang M.D.   On: 04/24/2017 21:02   US Renal  Result Date: 04/25/2017 CLINICAL DATA:  Acute renal insufficiency. EXAM: RENAL / URINARY TRACT ULTRASOUND COMPLETE COMPARISON:  CT scan June 24, 2015 FINDINGS: Right Kidney: Length: 12.9 cm. Echogenicity within normal limits. No mass or hydronephrosis visualized. Left Kidney: Length: 12.5 cm. There is a 6.3 x 4.2 x 5.7 cm hypoechoic structure along the medial aspect of the left renal midpole. A prominent extrarenal pelvis was seen in this region on previous CT imaging. Bladder: The bladder was empty and not well evaluated. IMPRESSION: 1. No cause for acute renal failure identified. 2. The 6.3 x 4.2 x 5.7 cm hypoechoic structure along  the medial aspect of the left renal midpole probably correlates with a prominent extrarenal pelvis seen on previous CT imaging. Electronically Signed   By:  Gerome Samavid  Williams III M.D   On: 04/25/2017 08:26   Dg Chest Port 1 View  Result Date: 04/27/2017 CLINICAL DATA:  Re- insertion of tracheostomy tube. EXAM: PORTABLE CHEST 1 VIEW COMPARISON:  04/24/2017 FINDINGS: The tip of a tracheostomy tube is seen centered over the superior mediastinum and upper trachea with tip terminating at the level of the aortic arch. Heart is enlarged with minimal aortic atherosclerosis. Mild vascular congestion is noted. No effusion or pneumothorax. No pulmonary consolidation. No acute nor suspicious osseous abnormality. IMPRESSION: 1. Cardiomegaly with mild pulmonary vascular congestion. 2. Minimal aortic atherosclerosis. 3. Tip of a tracheostomy tube is centered within the upper trachea with tip extending to the level of the aortic arch. Electronically Signed   By: Tollie Ethavid  Kwon M.D.   On: 04/27/2017 00:21    Time Spent in minutes  15   Eddie NorthHUNGEL, Elyan Vanwieren M.D on 05/12/2017 at 4:21 PM  Between 7am to 7pm - Pager - [phone]  After 7pm go to [email or web address] - password [email or web address]  [facility] -  Office  [phone]

## 2017-05-12 NOTE — Progress Notes (Signed)
Physical Therapy Treatment Patient Details Name: [name] MRN: [id number] DOB: July 23, 1951 Today's Date: 05/12/2017    History of Present Illness  [name] is a 66 y.o. male with medical history significant for COPD, chronic diastolic CHF, hypertension, morbid obesity, chronic kidney disease stage II, and OSA, into the emergency department for evaluation of generalized weakness--per chart pt has been too weak to get off the couch and has been urinating and defecating on himself for days.  Diagnosed with sepsis due to UTI, acute encephalopathy    PT Comments    On arrival, pt sitting EOB naked.  Pt required MAX encouragement to cover up and work with me.  Pt admits he "likes to give people a hard time".  Assisted with sit to stand several times so RN could apply dressing to 3 sores on pt's buttocks.  Assisted with amb a limited distance due to B LE weakness (R>L) and fatigue.  Pt unsteady.  Deconditioned.  Limited activity tolerance.   Follow Up Recommendations  SNF     Equipment Recommendations  None recommended by PT    Recommendations for Other Services       Precautions / Restrictions Precautions Precautions: Fall Precaution Comments: chronic trach with PMV Restrictions Weight Bearing Restrictions: No    Mobility  Bed Mobility               General bed mobility comments: sitting EOB Indep naked on arrival  Transfers Overall transfer level: Needs assistance Equipment used: Rolling walker (2 wheeled) Transfers: Sit to/from Stand Sit to Stand: Supervision;Min guard         General transfer comment: using forward momentum, pt was able to self rise from elevated bed and lower level recliner.    Ambulation/Gait Ambulation/Gait assistance: Supervision;Min guard Ambulation Distance (Feet): 15 Feet Assistive device: Rolling walker (2 wheeled) Gait Pattern/deviations: Step-to pattern;Step-through pattern;Decreased step length - right;Decreased step length -  left Gait velocity: decreased   General Gait Details: very unsteady with R LE weaker than L LE.  Limited distance due to weakness/fatigue.  Student nurse following with recliner as pt stated "that's it"   Stairs            Wheelchair Mobility    Modified Rankin (Stroke Patients Only)       Balance                                            Cognition Arousal/Alertness: Awake/alert Behavior During Therapy:  (Odd) Overall Cognitive Status: Difficult to assess                                 General Comments: pt admits he likes to "give people a hard time"   requires increased time to cooperate    requires MAX direction       Exercises      General Comments        Pertinent Vitals/Pain Pain Assessment: No/denies pain    Home Living                      Prior Function            PT Goals ([place] goals can now be found in the care plan section) Progress towards PT goals: Progressing toward goals    Frequency  Min 2X/week      PT Plan [place]ter plan remains appropriate    Co-evaluation              AM-PAC PT "6 Clicks" Daily Activity  Outcome Measure  Difficulty turning over in bed (including adjusting bedclothes, sheets and blankets)?: A Little Difficulty moving from lying on back to sitting on the side of the bed? : A Little Difficulty sitting down on and standing up from a chair with arms (e.g., wheelchair, bedside commode, etc,.)?: A Little Help needed moving to and from a bed to chair (including a wheelchair)?: A Lot Help needed walking in hospital room?: A Lot Help needed climbing 3-5 steps with a railing? : A Lot 6 Click Score: 15    End of Session Equipment Utilized During Treatment: Gait belt Activity Tolerance: Patient tolerated treatment well Patient left: in bed;with call bell/phone within reach Nurse Communication: Mobility status PT Visit Diagnosis: Muscle weakness (generalized)  (M62.81);Difficulty in walking, not elsewhere classified (R26.2)     Time: [id number] PT Time Calculation (min) (ACUTE ONLY): 32 min  Charges:  $Gait Training: 8-22 mins $Therapeutic Activity: 8-22 mins                    G Codes:       Felecia ShellingLori Harlo Fabela  PTA [facility]  Acute  Rehab Pager      [phone]

## 2017-05-12 NOTE — Progress Notes (Signed)
CSW contacted by staff member [name] and informed that they are able to offer patient a bed if Guardian can sign patient in. CSW agreed to follow up with patient's APS worker [name] regarding guardianship process and give SNF an update. CSW inquired about CSW ChiropodistAssistant Director being able to sign patient in until patient is appointed a guardian, staff agreed to check with administration and get back to CSW.  CSW contacted APS worker [name] and inquired about patient's guardianship process. APS worker reported that she will file the paperwork for guardianship either today or tomorrow and that patient will be assigned a court date and guardian ad litem. APS worker agreed to fax CSW paperwork after she files paperwork for guardianship and receives a court date and guardian ad litem for patient.   CSW updated CSW ChiropodistAssistant Director, who agreed to sign patient in if necessary.  CSW followed up with staff member [name], who reported that CSW ChiropodistAssistant Director is not able to sign in patient and that it will have to be patient's guardian.   CSW awaiting guardianship paperwork from APS worker.  Nicholas SickleKimberly Fuquan [name], [place] Clinical Social Worker [name] Cell#: [phone]

## 2017-05-13 LAB — GLUCOSE, CAPILLARY
GLUCOSE-CAPILLARY: 95 mg/dL (ref 65–99)
GLUCOSE-CAPILLARY: 125 mg/dL — AB (ref 65–99)
GLUCOSE-CAPILLARY: 97 mg/dL (ref 65–99)
Glucose-Capillary: 85 mg/dL (ref 65–99)

## 2017-05-13 NOTE — Progress Notes (Signed)
Pt refused continuous bedside pulse oximetry Will spot check o2 saturatin and heart rate with trach checks. O2 saturation 98 heart rate 78 at this time.

## 2017-05-13 NOTE — Progress Notes (Signed)
PROGRESS NOTE                                                                                                                                                                                                             Patient Demographics:    [name], is a 66 y.o. male, DOB - Jul 02, 1951, ZOX:[id number]  Admit date - 04/24/2017   Admitting Physician Briscoe Deutscher, MD  Outpatient Primary MD for the patient is No primary care provider on file.  LOS - 19    Chief Complaint  Patient presents with  . Weakness       Brief Narrative  66 year old male with chronic respiratory failure, tracheostomy status, chronic diastolic CHF and COPD presented with weakness and being unable to get a from his couch at home. Patient was found to have sepsis secondary to UTI and acute kidney injury. Patient completed antibiotics on 8/25. Patient also having intermittent impulsive behavior in the hospital with episodic confusion. Patient evaluated by psychiatry and deemed not competent. Hospital course prolonged due to requirement of guardianship and placement.    Subjective:   Trach collar changed by respiratory therapist yday. No issues  Assessment  & Plan :   Principal problem Acute metabolic encephalopathy with underlying dementia. Deemed not competent to make complex medical decision by psychiatry. Has not shown any agitation or aggressive outbursts for past 1 week. Sitter has been discontinued. TSH normal. Social worker awaiting guardianship paperwork from APS.  Sepsis secondary to UTI Resolved. Completed antibiotic course (Rocephin followed by Cipro)  Acute on chronic kidney disease stage II Secondary to sepsis and UTI. Resolved.  Morbid obesity with OHS/OSA and chronic hypoxic/ hypercapnic respiratory failure Stable. On chronic trach. His trach to be changed by respiratory therapist today. Continue pulmonary  toilet. Nutrition consult appreciated.  Chronic diastolic CHF Euvolemic  Type 2 diabetes mellitus A1c of 7.3. Continue sliding scale coverage.   Hyponatremia Secondary to dehydration. Resolved with fluids.  Essential hypertension Continue hydralazine. Stable.  Moisture associated intertriginous dermatitis Seen by wound care. Appreciate recommendation. Full-thickness areas on buttocks have improved but not completely healed.   Code Status : Full code  Family Communication  : Patient does not have any close family  Disposition Plan  : SNF  Barriers For Discharge : Awaiting paperwork for disposition  Consults  :  Psychiatry  Procedures  : None  DVT Prophylaxis  :  Lovenox -   Lab Results  Component Value Date   PLT 278 05/12/2017    Antibiotics  :   Anti-infectives    Start     Dose/Rate Route Frequency Ordered Stop   05/01/17 0930  ciprofloxacin (CIPRO) tablet 500 mg  Status:  Discontinued     500 mg Oral 2 times daily 05/01/17 0924 05/09/17 1050   04/27/17 0900  fluconazole (DIFLUCAN) IVPB 100 mg  Status:  Discontinued     100 mg 50 mL/hr over 60 Minutes Intravenous Every 24 hours 04/27/17 0752 04/27/17 0754   04/27/17 0900  fluconazole (DIFLUCAN) IVPB 100 mg     100 mg 50 mL/hr over 60 Minutes Intravenous Every 24 hours 04/27/17 0754 04/28/17 1003   04/25/17 0315  cefTRIAXone (ROCEPHIN) 1 g in dextrose 5 % 50 mL IVPB  Status:  Discontinued     1 g 100 mL/hr over 30 Minutes Intravenous Daily at bedtime 04/25/17 0310 05/01/17 0924        Objective:   Vitals:   05/13/17 0628 05/13/17 0819 05/13/17 1123 05/13/17 1321  BP: 117/81   (!) 106/51  Pulse: 94 78 84 61  Resp: 18 18 18 18   Temp: 98.6 F (37 C)   98.1 F (36.7 C)  TempSrc: Oral   Oral  SpO2: 94% 98% 98% 96%  Weight: 128.9 kg (284 lb 2.8 oz)     Height:        Wt Readings from Last 3 Encounters:  05/13/17 128.9 kg (284 lb 2.8 oz)  10/15/16 118.4 kg (261 lb)  09/14/16 113.4 kg (250 lb)      Intake/Output Summary (Last 24 hours) at 05/13/17 1423 Last data filed at 05/13/17 0630  Gross per 24 hour  Intake                0 ml  Output              600 ml  Net             -600 ml     Physical Exam  Gen: not in distress HEENT: Tracheostomy status, moist mucosa, supple neck Chest: clear b/l, no added sounds CVS: Normal S1 and S2, no murmurs GI: soft, NT, ND,  Musculoskeletal: warm, no edema CNS: AAOX1-2    Data Review:    CBC  Recent Labs Lab 05/08/17 0516 05/12/17 0400  WBC 7.5 7.4  HGB 11.1* 12.2*  HCT 34.6* 37.1*  PLT 264 278  MCV 84.4 85.3  MCH 27.1 28.0  MCHC 32.1 32.9  RDW 21.0* 21.3*    Chemistries   Recent Labs Lab 05/08/17 0516 05/12/17 0400  NA 137 138  K 3.6 3.6  CL 105 105  CO2 26 23  GLUCOSE 102* 104*  BUN 14 15  CREATININE 1.37* 1.56*  CALCIUM 8.9 9.2   ------------------------------------------------------------------------------------------------------------------ No results for input(s): CHOL, HDL, LDLCALC, TRIG, CHOLHDL, LDLDIRECT in the last 72 hours.  Lab Results  Component Value Date   HGBA1C 7.3 (H) 04/26/2017   ------------------------------------------------------------------------------------------------------------------ No results for input(s): TSH, T4TOTAL, T3FREE, THYROIDAB in the last 72 hours.  Invalid input(s): FREET3 ------------------------------------------------------------------------------------------------------------------ No results for input(s): VITAMINB12, FOLATE, FERRITIN, TIBC, IRON, RETICCTPCT in the last 72 hours.  Coagulation profile No results for input(s): INR, PROTIME in the last 168 hours.  No results for input(s): DDIMER in the last 72 hours.  Cardiac Enzymes No results for input(s): CKMB, TROPONINI, MYOGLOBIN  in the last 168 hours.  Invalid input(s): CK ------------------------------------------------------------------------------------------------------------------     Component Value Date/Time   BNP 48.6 04/24/2017 2059    Inpatient Medications  Scheduled Meds: . benzonatate  100 mg Oral TID  . chlorhexidine  15 mL Mouth Rinse BID  . docusate sodium  100 mg Oral BID  . fluticasone  2 spray Each Nare Daily  . guaiFENesin-dextromethorphan  5 mL Oral TID  . heparin subcutaneous  5,000 Units Subcutaneous Q8H  . hydrALAZINE  37.5 mg Oral BID  . hydrocerin   Topical BID  . insulin aspart  0-5 Units Subcutaneous QHS  . insulin aspart  0-9 Units Subcutaneous TID WC  . liver oil-zinc oxide   Topical TID  . mouth rinse  15 mL Mouth Rinse q12n4p  . sodium chloride flush  3 mL Intravenous Q12H   Continuous Infusions: PRN Meds:.acetaminophen **OR** acetaminophen, bisacodyl, diphenhydrAMINE, hydrALAZINE, HYDROcodone-acetaminophen, ipratropium-albuterol, menthol-cetylpyridinium, ondansetron **OR** ondansetron (ZOFRAN) IV, senna-docusate  Micro Results No results found for this or any previous visit (from the past 240 hour(s)).  Radiology Reports Dg Chest 2 View  Result Date: 04/24/2017 CLINICAL DATA:  Increasing weakness EXAM: CHEST  2 VIEW COMPARISON:  03/14/2016 FINDINGS: Tracheostomy tube is again identified. Cardiac shadow is stable. The lungs are well aerated bilaterally. No focal infiltrate or sizable effusion is seen. Degenerative changes of the thoracic spine are noted. IMPRESSION: No acute abnormality seen. Electronically Signed   By: Alcide Clever M.D.   On: 04/24/2017 20:57   Dg Lumbar Spine Complete  Result Date: 04/24/2017 CLINICAL DATA:  Increasing weakness with inability to stand EXAM: LUMBAR SPINE - COMPLETE 4+ VIEW COMPARISON:  06/25/2015 FINDINGS: Five lumbar type vertebral bodies are well visualized. Vertebral body height is well maintained. Multilevel osteophytic changes are seen. Disc space narrowing is noted at multiple levels worst at L2-3 and L3-4. Aortic calcifications are noted. An IVC filter is noted in satisfactory position. No  overlying soft tissue abnormality is seen. IMPRESSION: Multilevel degenerative change without acute abnormality. Electronically Signed   By: Alcide Clever M.D.   On: 04/24/2017 20:58   Ct Head Wo Contrast  Result Date: 04/24/2017 CLINICAL DATA:  Increased weakness unable to stand EXAM: CT HEAD WITHOUT CONTRAST TECHNIQUE: Contiguous axial images were obtained from the base of the skull through the vertex without intravenous contrast. COMPARISON:  01/28/2015 FINDINGS: Brain: No acute territorial infarction, hemorrhage, or intracranial mass is seen. Mild small vessel ischemic changes of the white matter. Stable ventricle size. Mild atrophy. Vascular: No hyperdense vessels.  Carotid artery calcifications. Skull: No fracture or suspicious bone lesion. Sinuses/Orbits: No acute finding. Other: None IMPRESSION: No CT evidence for acute intracranial abnormality. Small vessel ischemic changes of the white matter and mild atrophy Electronically Signed   By: Jasmine Pang M.D.   On: 04/24/2017 21:02   US Renal  Result Date: 04/25/2017 CLINICAL DATA:  Acute renal insufficiency. EXAM: RENAL / URINARY TRACT ULTRASOUND COMPLETE COMPARISON:  CT scan June 24, 2015 FINDINGS: Right Kidney: Length: 12.9 cm. Echogenicity within normal limits. No mass or hydronephrosis visualized. Left Kidney: Length: 12.5 cm. There is a 6.3 x 4.2 x 5.7 cm hypoechoic structure along the medial aspect of the left renal midpole. A prominent extrarenal pelvis was seen in this region on previous CT imaging. Bladder: The bladder was empty and not well evaluated. IMPRESSION: 1. No cause for acute renal failure identified. 2. The 6.3 x 4.2 x 5.7 cm hypoechoic structure along the medial aspect of the left  renal midpole probably correlates with a prominent extrarenal pelvis seen on previous CT imaging. Electronically Signed   By: Gerome Sam III M.D   On: 04/25/2017 08:26   Dg Chest Port 1 View  Result Date: 04/27/2017 CLINICAL DATA:  Re-  insertion of tracheostomy tube. EXAM: PORTABLE CHEST 1 VIEW COMPARISON:  04/24/2017 FINDINGS: The tip of a tracheostomy tube is seen centered over the superior mediastinum and upper trachea with tip terminating at the level of the aortic arch. Heart is enlarged with minimal aortic atherosclerosis. Mild vascular congestion is noted. No effusion or pneumothorax. No pulmonary consolidation. No acute nor suspicious osseous abnormality. IMPRESSION: 1. Cardiomegaly with mild pulmonary vascular congestion. 2. Minimal aortic atherosclerosis. 3. Tip of a tracheostomy tube is centered within the upper trachea with tip extending to the level of the aortic arch. Electronically Signed   By: Tollie Eth M.D.   On: 04/27/2017 00:21    Time Spent in minutes  15   Eddie North M.D on 05/13/2017 at 2:23 PM  Between 7am to 7pm - Pager - [phone]  After 7pm go to [email or web address] - password [facility]  [facility] -  Office  [phone]

## 2017-05-13 NOTE — Progress Notes (Signed)
Placement: Guardianship Paperwork  Petition has been completed on behalf of patient and paperwork sent to [employer] CSW by this Clinical research associatewriter via faxed.  Patient Guardian at [facility]:  Gwen Poundseter O'Connell  [phone] Hearing scheduled:  May 18 2017  At 11:00am  Patient has been worked up for placement and per notes, [facility]. Guardian at [facility] can sign patient into facility and paperwork to be faxed to facility for records and finalize placement.  Updated Unit CSW who will follow up on Friday.   Deretha EmoryHannah Ambri Miltner LCSW, MSW Clinical Social Work: Optician, dispensingystem Wide Float Coverage for :  [phone]

## 2017-05-13 NOTE — Progress Notes (Signed)
CSW contacted by APS worker Salome Spottedia Turner and notified that petition was being filed for guardianship and that patient will be served with guardianship paperwork.  CSW updated patient about SNF placement and guardianship, patient replied okay. [facility] reported [facility], CSW informed patient that was the only bed offer currently. CSW inquired if patient had any questions, patient reported that he didn't know what to say. CSW agreed to keep patient informed of updates.  CSW provided update to [facility] and agreed to contact staff after patient was served with guardianship paperwork.   CSW is waiting for patient to be served with guardianship paperwork to move forward with SNF placement.   Celso SickleKimberly Lorretta Kerce, [place] Clinical Social Worker [facility] Cell#: [phone]

## 2017-05-14 LAB — GLUCOSE, CAPILLARY
GLUCOSE-CAPILLARY: 106 mg/dL — AB (ref 65–99)
Glucose-Capillary: 105 mg/dL — ABNORMAL HIGH (ref 65–99)
Glucose-Capillary: 112 mg/dL — ABNORMAL HIGH (ref 65–99)

## 2017-05-14 NOTE — Progress Notes (Signed)
Pt was somewhat cooperative this shift. He allowed us to wash and moisturize his feet and legs up to his knees.

## 2017-05-14 NOTE — Progress Notes (Signed)
Physical Therapy Treatment Patient Details Name: [name] [place] MRN: [id number] DOB: [birth year] Today's Date: 05/14/2017    History of Present Illness  [name] Jipson is a 66 y.o. male with medical history significant for COPD, chronic diastolic CHF, hypertension, morbid obesity, chronic kidney disease stage II, and OSA, into the emergency department for evaluation of generalized weakness--per chart pt has been too weak to get off the couch and has been urinating and defecating on himself for days.  Diagnosed with sepsis due to UTI, acute encephalopathy    PT Comments    Pt unable to stand today, although stood 2 days ago with min/guard.  Pt acted like he was attempting to stand, but did not feel good effort truly given, with pt stating, " I just can't today." Pt did clear buttocks one time.  Con't to recommend SNF.   Follow Up Recommendations  SNF     Equipment Recommendations  None recommended by PT    Recommendations for Other Services       Precautions / Restrictions Restrictions Weight Bearing Restrictions: No    Mobility  Bed Mobility               General bed mobility comments: sitting EOB Indep naked on arrival  Transfers Overall transfer level: Needs assistance Equipment used: Rolling walker (2 wheeled) Transfers: Sit to/from Stand Sit to Stand: Max assist         General transfer comment: Pt instructed in proper hand placement for sit > stand, but pt did not follow instructions. Pt almost resistant to standing although looking like he was trying to stand, was not fully giving forth effort.  Cleared buttocks 1x  Ambulation/Gait                 Stairs            Wheelchair Mobility    Modified Rankin (Stroke Patients Only)       Balance   Sitting-balance support: Feet supported Sitting balance-Leahy Scale: Good Sitting balance - Comments: able to wt shift laterally to adjust gown   Standing balance support: Bilateral upper extremity  supported Standing balance-Leahy Scale: Poor                              Cognition Arousal/Alertness: Awake/alert Behavior During Therapy: WFL for tasks assessed/performed Overall Cognitive Status: Difficult to assess                         Following Commands: Follows one step commands inconsistently Safety/Judgement: Decreased awareness of safety;Decreased awareness of deficits   Problem Solving: Slow processing;Decreased initiation General Comments: frequent re-direction and decreased short term memory at times      Exercises      General Comments        Pertinent Vitals/Pain Pain Assessment: No/denies pain    Home Living                      Prior Function            PT Goals (Wille goals can now be found in the care plan section) Acute Rehab PT Goals PT Goal Formulation: With patient Time For Goal Achievement: 05/21/17 Potential to Achieve Goals: Good Progress towards PT goals: Not progressing toward goals - comment    Frequency    Min 2X/week      PT Plan Franssen plan remains appropriate  Co-evaluation              AM-PAC PT "6 Clicks" Daily Activity  Outcome Measure  Difficulty turning over in bed (including adjusting bedclothes, sheets and blankets)?: A Little Difficulty moving from lying on back to sitting on the side of the bed? : A Little Difficulty sitting down on and standing up from a chair with arms (e.g., wheelchair, bedside commode, etc,.)?: Unable Help needed moving to and from a bed to chair (including a wheelchair)?: A Lot Help needed walking in hospital room?: Total Help needed climbing 3-5 steps with a railing? : Total 6 Click Score: 11    End of Session Equipment Utilized During Treatment: Gait belt Activity Tolerance: Patient limited by fatigue Patient left: in bed;with call bell/phone within reach Nurse Communication: Mobility status PT Visit Diagnosis: Muscle weakness (generalized)  (M62.81);Difficulty in walking, not elsewhere classified (R26.2)     Time: 1610-9604 PT Time Calculation (min) (ACUTE ONLY): 11 min  Charges:  $Therapeutic Activity: 8-22 mins                    G Codes:       Haven Foss L. Katrinka Blazing, [place] Pager [phone] 05/14/2017    Enzo Montgomery 05/14/2017, 1:08 PM

## 2017-05-14 NOTE — Plan of Care (Signed)
Problem: Activity: Goal: Activity intolerance will improve Outcome: Not Progressing Pt still unable to ambulate.

## 2017-05-14 NOTE — Progress Notes (Signed)
PROGRESS NOTE                                                                                                                                                                                                             Patient Demographics:    [name], is a 66 y.o. male, DOB - 1950-11-26, ZOX:[id number]  Admit date - 04/24/2017   Admitting Physician Briscoe Deutscher, MD  Outpatient Primary MD for the patient is No primary care provider on file.  LOS - 20    Chief Complaint  Patient presents with  . Weakness       Brief Narrative  66 year old male with chronic respiratory failure, tracheostomy status, chronic diastolic CHF and COPD presented with weakness and being unable to get a from his couch at home. Patient was found to have sepsis secondary to UTI and acute kidney injury. Patient completed antibiotics on 8/25. Patient also having intermittent impulsive behavior in the hospital with episodic confusion. Patient evaluated by psychiatry and deemed not competent. Hospital course prolonged due to requirement of guardianship and placement.    Subjective:   No overnight events.  Assessment  & Plan :   Principal problem Acute metabolic encephalopathy with underlying dementia. Deemed not competent to make complex medical decision by psychiatry. Has not shown any agitation or aggressive outbursts for past 1 week. Sitter has been discontinued. TSH normal. Social worker awaiting guardianship paperwork from APS.  Sepsis secondary to UTI Resolved. Completed antibiotic course (Rocephin followed by Cipro)  Acute on chronic kidney disease stage II Secondary to sepsis and UTI. Resolved.  Morbid obesity with OHS/OSA and chronic hypoxic/ hypercapnic respiratory failure Stable. On chronic trach. His trach to be changed by respiratory therapist today. Continue pulmonary toilet. Nutrition consult appreciated.  Chronic  diastolic CHF Euvolemic  Type 2 diabetes mellitus A1c of 7.3. Continue sliding scale coverage.   Hyponatremia Secondary to dehydration. Resolved with fluids.  Essential hypertension Continue hydralazine. Stable.  Moisture associated intertriginous dermatitis Seen by wound care. Appreciate recommendation. Full-thickness areas on buttocks have improved but not completely healed.   Code Status : Full code  Family Communication  : Patient does not have any close family  Disposition Plan  : Awaiting SNF  Barriers For Discharge : Awaiting paperwork for disposition. expedited competency hearing scheduled 05/18/17 after which interim guardian will be  appointed who should then have that power/authority.  Consults  :  Psychiatry  Procedures  : None  DVT Prophylaxis  :  Lovenox -   Lab Results  Component Value Date   PLT 278 05/12/2017    Antibiotics  :   Anti-infectives    Start     Dose/Rate Route Frequency Ordered Stop   05/01/17 0930  ciprofloxacin (CIPRO) tablet 500 mg  Status:  Discontinued     500 mg Oral 2 times daily 05/01/17 0924 05/09/17 1050   04/27/17 0900  fluconazole (DIFLUCAN) IVPB 100 mg  Status:  Discontinued     100 mg 50 mL/hr over 60 Minutes Intravenous Every 24 hours 04/27/17 0752 04/27/17 0754   04/27/17 0900  fluconazole (DIFLUCAN) IVPB 100 mg     100 mg 50 mL/hr over 60 Minutes Intravenous Every 24 hours 04/27/17 0754 04/28/17 1003   04/25/17 0315  cefTRIAXone (ROCEPHIN) 1 g in dextrose 5 % 50 mL IVPB  Status:  Discontinued     1 g 100 mL/hr over 30 Minutes Intravenous Daily at bedtime 04/25/17 0310 05/01/17 0924        Objective:   Vitals:   05/14/17 0557 05/14/17 0751 05/14/17 1131 05/14/17 1326  BP: 101/70   105/62  Pulse: 77 74 76 94  Resp: Temp: 98.1 F (36.7 C)   97.6 F (36.4 C)  TempSrc: Oral   Oral  SpO2: 100% 99% 92% 94%  Weight: 128.4 kg (283 lb 1.1 oz)     Height:        Wt Readings from Last 3 Encounters:    05/14/17 128.4 kg (283 lb 1.1 oz)  10/15/16 118.4 kg (261 lb)  09/14/16 113.4 kg (250 lb)     Intake/Output Summary (Last 24 hours) at 05/14/17 1456 Last data filed at 05/14/17 1333  Gross per 24 hour  Intake              831 ml  Output             1015 ml  Net             -184 ml     Physical Exam Gen.: Not in distress HEENT: Tracheostomy status Chest: Clear bilaterally  CVS: Normal S1 and S2 GI: Soft, nondistended, nontender     Data Review:    CBC  Recent Labs Lab 05/08/17 0516 05/12/17 0400  WBC 7.5 7.4  HGB 11.1* 12.2*  HCT 34.6* 37.1*  PLT 264 278  MCV 84.4 85.3  MCH 27.1 28.0  MCHC 32.1 32.9  RDW 21.0* 21.3*    Chemistries   Recent Labs Lab 05/08/17 0516 05/12/17 0400  NA 137 138  K 3.6 3.6  CL 105 105  CO2 26 23  GLUCOSE 102* 104*  BUN 14 15  CREATININE 1.37* 1.56*  CALCIUM 8.9 9.2   ------------------------------------------------------------------------------------------------------------------ No results for input(s): CHOL, HDL, LDLCALC, TRIG, CHOLHDL, LDLDIRECT in the last 72 hours.  Lab Results  Component Value Date   HGBA1C 7.3 (H) 04/26/2017   ------------------------------------------------------------------------------------------------------------------ No results for input(s): TSH, T4TOTAL, T3FREE, THYROIDAB in the last 72 hours.  Invalid input(s): FREET3 ------------------------------------------------------------------------------------------------------------------ No results for input(s): VITAMINB12, FOLATE, FERRITIN, TIBC, IRON, RETICCTPCT in the last 72 hours.  Coagulation profile No results for input(s): INR, PROTIME in the last 168 hours.  No results for input(s): DDIMER in the last 72 hours.  Cardiac Enzymes No results for input(s): CKMB, TROPONINI, MYOGLOBIN in the last 168  hours.  Invalid input(s):  CK ------------------------------------------------------------------------------------------------------------------    Component Value Date/Time   BNP 48.6 04/24/2017 2059    Inpatient Medications  Scheduled Meds: . benzonatate  100 mg Oral TID  . chlorhexidine  15 mL Mouth Rinse BID  . docusate sodium  100 mg Oral BID  . fluticasone  2 spray Each Nare Daily  . guaiFENesin-dextromethorphan  5 mL Oral TID  . heparin subcutaneous  5,000 Units Subcutaneous Q8H  . hydrALAZINE  37.5 mg Oral BID  . hydrocerin   Topical BID  . insulin aspart  0-5 Units Subcutaneous QHS  . insulin aspart  0-9 Units Subcutaneous TID WC  . liver oil-zinc oxide   Topical TID  . mouth rinse  15 mL Mouth Rinse q12n4p  . sodium chloride flush  3 mL Intravenous Q12H   Continuous Infusions: PRN Meds:.acetaminophen **OR** acetaminophen, bisacodyl, diphenhydrAMINE, hydrALAZINE, HYDROcodone-acetaminophen, ipratropium-albuterol, menthol-cetylpyridinium, ondansetron **OR** ondansetron (ZOFRAN) IV, senna-docusate  Micro Results No results found for this or any previous visit (from the past 240 hour(s)).  Radiology Reports Dg Chest 2 View  Result Date: 04/24/2017 CLINICAL DATA:  Increasing weakness EXAM: CHEST  2 VIEW COMPARISON:  03/14/2016 FINDINGS: Tracheostomy tube is again identified. Cardiac shadow is stable. The lungs are well aerated bilaterally. No focal infiltrate or sizable effusion is seen. Degenerative changes of the thoracic spine are noted. IMPRESSION: No acute abnormality seen. Electronically Signed   By: Alcide Clever M.D.   On: 04/24/2017 20:57   Dg Lumbar Spine Complete  Result Date: 04/24/2017 CLINICAL DATA:  Increasing weakness with inability to stand EXAM: LUMBAR SPINE - COMPLETE 4+ VIEW COMPARISON:  06/25/2015 FINDINGS: Five lumbar type vertebral bodies are well visualized. Vertebral body height is well maintained. Multilevel osteophytic changes are seen. Disc space narrowing is noted at  multiple levels worst at L2-3 and L3-4. Aortic calcifications are noted. An IVC filter is noted in satisfactory position. No overlying soft tissue abnormality is seen. IMPRESSION: Multilevel degenerative change without acute abnormality. Electronically Signed   By: Alcide Clever M.D.   On: 04/24/2017 20:58   Ct Head Wo Contrast  Result Date: 04/24/2017 CLINICAL DATA:  Increased weakness unable to stand EXAM: CT HEAD WITHOUT CONTRAST TECHNIQUE: Contiguous axial images were obtained from the base of the skull through the vertex without intravenous contrast. COMPARISON:  01/28/2015 FINDINGS: Brain: No acute territorial infarction, hemorrhage, or intracranial mass is seen. Mild small vessel ischemic changes of the white matter. Stable ventricle size. Mild atrophy. Vascular: No hyperdense vessels.  Carotid artery calcifications. Skull: No fracture or suspicious bone lesion. Sinuses/Orbits: No acute finding. Other: None IMPRESSION: No CT evidence for acute intracranial abnormality. Small vessel ischemic changes of the white matter and mild atrophy Electronically Signed   By: Jasmine Pang M.D.   On: 04/24/2017 21:02   US Renal  Result Date: 04/25/2017 CLINICAL DATA:  Acute renal insufficiency. EXAM: RENAL / URINARY TRACT ULTRASOUND COMPLETE COMPARISON:  CT scan June 24, 2015 FINDINGS: Right Kidney: Length: 12.9 cm. Echogenicity within normal limits. No mass or hydronephrosis visualized. Left Kidney: Length: 12.5 cm. There is a 6.3 x 4.2 x 5.7 cm hypoechoic structure along the medial aspect of the left renal midpole. A prominent extrarenal pelvis was seen in this region on previous CT imaging. Bladder: The bladder was empty and not well evaluated. IMPRESSION: 1. No cause for acute renal failure identified. 2. The 6.3 x 4.2 x 5.7 cm hypoechoic structure along the medial aspect of the left renal midpole probably correlates  with a prominent extrarenal pelvis seen on previous CT imaging. Electronically Signed   By:  Gerome Samavid  Williams III M.D   On: 04/25/2017 08:26   Dg Chest Port 1 View  Result Date: 04/27/2017 CLINICAL DATA:  Re- insertion of tracheostomy tube. EXAM: PORTABLE CHEST 1 VIEW COMPARISON:  04/24/2017 FINDINGS: The tip of a tracheostomy tube is seen centered over the superior mediastinum and upper trachea with tip terminating at the level of the aortic arch. Heart is enlarged with minimal aortic atherosclerosis. Mild vascular congestion is noted. No effusion or pneumothorax. No pulmonary consolidation. No acute nor suspicious osseous abnormality. IMPRESSION: 1. Cardiomegaly with mild pulmonary vascular congestion. 2. Minimal aortic atherosclerosis. 3. Tip of a tracheostomy tube is centered within the upper trachea with tip extending to the level of the aortic arch. Electronically Signed   By: Tollie Ethavid  Kwon M.D.   On: 04/27/2017 00:21    Time Spent in minutes  15   Eddie NorthHUNGEL, Patty Leitzke M.D on 05/14/2017 at 2:56 PM  Between 7am to 7pm - Pager - [phone]  After 7pm go to [email or web address] - password [email or web address]  [facility] -  Office  [phone]

## 2017-05-14 NOTE — Progress Notes (Addendum)
CSW following for assistance with disposition. CSW spoke with guardian at [facility] Gwen Poundseter O'Connell  [phone]- was appointed guardian at [facility] yesterday evening and paperwork is on pt's chart. Guardian at [facility] states he does not have authority to sign pt into facility- states that an expedited competency hearing scheduled 05/18/17 after which interim guardian will be appointed who should then have that power/authority. Both CSW and Guardian at [facility] left voicemails for APS case worker T. Mayford Knifeurner [phone] in order to clarify if pt can be signed into SNF today. CSW updated facility [name]  Ilean SkillMeghan Tasharra Nodine, MSW, LCSW Clinical Social Work 05/14/2017 [phone]  Left additional voicemail for APS worker above- have not made contact. However, CSW called DSS placement case worker Dept and inquired in general as to if guardian at [facility] had authority to sign pt into a facility- DSS states "guardian at [facility] advocates for individual and will represent his interests in the court hearing on 05/18/17, does not have authority to make decisions and sign paperwork for admission for pt." [facility] [place]- confirms no other individual besides guardian (or interim guardian) will be accepted as signing pt in. Updated [facility]ist[facility]istant Director.  Plan: unless alternate SNF bed found which would accept AD to sign pt in, pt will not have an interim guardian prior to 05/18/17 and will not be able to enter SNF prior to this.  Will continue following

## 2017-05-15 LAB — BASIC METABOLIC PANEL
Anion gap: 9 (ref 5–15)
BUN: 14 mg/dL (ref 6–20)
CHLORIDE: 104 mmol/L (ref 101–111)
CO2: 24 mmol/L (ref 22–32)
CREATININE: 1.51 mg/dL — AB (ref 0.61–1.24)
Calcium: 9.1 mg/dL (ref 8.9–10.3)
GFR calc Af Amer: 55 mL/min — ABNORMAL LOW (ref >60)
GFR calc non Af Amer: 47 mL/min — ABNORMAL LOW (ref >60)
Glucose, Bld: 101 mg/dL — ABNORMAL HIGH (ref 65–99)
POTASSIUM: 3.2 mmol/L — AB (ref 3.5–5.1)
Sodium: 137 mmol/L (ref 135–145)

## 2017-05-15 LAB — GLUCOSE, CAPILLARY
GLUCOSE-CAPILLARY: 124 mg/dL — AB (ref 65–99)
GLUCOSE-CAPILLARY: 131 mg/dL — AB (ref 65–99)
GLUCOSE-CAPILLARY: 108 mg/dL — AB (ref 65–99)
Glucose-Capillary: 109 mg/dL — ABNORMAL HIGH (ref 65–99)
Glucose-Capillary: 122 mg/dL — ABNORMAL HIGH (ref 65–99)

## 2017-05-15 MED ORDER — POTASSIUM CHLORIDE CRYS ER 20 MEQ PO TBCR
40.0000 meq | EXTENDED_RELEASE_TABLET | Freq: Once | ORAL | Status: AC
  Administered 2017-05-15: 40 meq via ORAL
  Filled 2017-05-15: qty 2

## 2017-05-15 NOTE — Progress Notes (Signed)
PROGRESS NOTE                                                                                                                                                                                                             Patient Demographics:    [name], is a 66 y.o. male, DOB - 07/08/1951, ZOX:[id number]  Admit date - 04/24/2017   Admitting Physician Briscoe Deutscherimothy S Opyd, MD  Outpatient Primary MD for the patient is No primary care provider on file.  LOS - 21    Chief Complaint  Patient presents with  . Weakness       Brief Narrative  66 year old male with chronic respiratory failure, tracheostomy status, chronic diastolic CHF and COPD presented with weakness and being unable to get a from his couch at home. Patient was found to have sepsis secondary to UTI and acute kidney injury. Patient completed antibiotics on 8/25. Patient also having intermittent impulsive behavior in the hospital with episodic confusion. Patient evaluated by psychiatry and deemed not competent. Hospital course prolonged due to requirement of guardianship and placement.    Subjective:   No overnight events.  Assessment  & Plan :   Principal problem Acute metabolic encephalopathy with underlying dementia. Deemed not competent to make complex medical decision by psychiatry. Has not shown any agitation or aggressive outbursts for past 1 week. Sitter has been discontinued. TSH normal. Social worker awaiting guardianship paperwork from APS.  Sepsis secondary to UTI Resolved. Completed antibiotic course (Rocephin followed by Cipro)  Acute on chronic kidney disease stage II Secondary to sepsis and UTI. Resolved.  Hypokalemia Replenished   Morbid obesity with OHS/OSA and chronic hypoxic/ hypercapnic respiratory failure Stable. On chronic trach. His trach to be changed by respiratory therapist today. Continue pulmonary toilet. Nutrition  consult appreciated.  Chronic diastolic CHF Euvolemic  Type 2 diabetes mellitus A1c of 7.3. Continue sliding scale coverage.   Hyponatremia Secondary to dehydration. Resolved with fluids.  Essential hypertension Continue hydralazine. Stable.  Moisture associated intertriginous dermatitis Seen by wound care. Appreciate recommendation. Full-thickness areas on buttocks have improved but not completely healed.   Code Status : Full code  Family Communication  : Patient does not have any close family  Disposition Plan  : Awaiting SNF  Barriers For Discharge : Awaiting paperwork for disposition. expedited competency hearing scheduled 05/18/17 after which  interim guardian will be appointed who should then have that power/authority.  Consults  :  Psychiatry  Procedures  : None  DVT Prophylaxis  :  Lovenox -   Lab Results  Component Value Date   PLT 278 05/12/2017    Antibiotics  :   Anti-infectives    Start     Dose/Rate Route Frequency Ordered Stop   05/01/17 0930  ciprofloxacin (CIPRO) tablet 500 mg  Status:  Discontinued     500 mg Oral 2 times daily 05/01/17 0924 05/09/17 1050   04/27/17 0900  fluconazole (DIFLUCAN) IVPB 100 mg  Status:  Discontinued     100 mg 50 mL/hr over 60 Minutes Intravenous Every 24 hours 04/27/17 0752 04/27/17 0754   04/27/17 0900  fluconazole (DIFLUCAN) IVPB 100 mg     100 mg 50 mL/hr over 60 Minutes Intravenous Every 24 hours 04/27/17 0754 04/28/17 1003   04/25/17 0315  cefTRIAXone (ROCEPHIN) 1 g in dextrose 5 % 50 mL IVPB  Status:  Discontinued     1 g 100 mL/hr over 30 Minutes Intravenous Daily at bedtime 04/25/17 0310 05/01/17 0924        Objective:   Vitals:   05/14/17 2038 05/15/17 0635 05/15/17 0800 05/15/17 1108  BP: 120/66 120/65    Pulse: 79 76 78 85  Resp: Temp: 98.6 F (37 C) 98.6 F (37 C)    TempSrc: Oral Oral    SpO2: 100% 100% 99% 94%  Weight:      Height:        Wt Readings from Last 3  Encounters:  05/14/17 128.4 kg (283 lb 1.1 oz)  10/15/16 118.4 kg (261 lb)  09/14/16 113.4 kg (250 lb)     Intake/Output Summary (Last 24 hours) at 05/15/17 1146 Last data filed at 05/15/17 1610  Gross per 24 hour  Intake              711 ml  Output             1000 ml  Net             -289 ml     Physical Exam Gen.: Not in distress HEENT: Tracheostomy status Chest: Clear bilaterally  CVS: Normal S1 and S2 GI: Soft, nondistended, nontender     Data Review:    CBC  Recent Labs Lab 05/12/17 0400  WBC 7.4  HGB 12.2*  HCT 37.1*  PLT 278  MCV 85.3  MCH 28.0  MCHC 32.9  RDW 21.3*    Chemistries   Recent Labs Lab 05/12/17 0400 05/15/17 0459  NA 138 137  K 3.6 3.2*  CL 105 104  CO2 23 24  GLUCOSE 104* 101*  BUN 15 14  CREATININE 1.56* 1.51*  CALCIUM 9.2 9.1   ------------------------------------------------------------------------------------------------------------------ No results for input(s): CHOL, HDL, LDLCALC, TRIG, CHOLHDL, LDLDIRECT in the last 72 hours.  Lab Results  Component Value Date   HGBA1C 7.3 (H) 04/26/2017   ------------------------------------------------------------------------------------------------------------------ No results for input(s): TSH, T4TOTAL, T3FREE, THYROIDAB in the last 72 hours.  Invalid input(s): FREET3 ------------------------------------------------------------------------------------------------------------------ No results for input(s): VITAMINB12, FOLATE, FERRITIN, TIBC, IRON, RETICCTPCT in the last 72 hours.  Coagulation profile No results for input(s): INR, PROTIME in the last 168 hours.  No results for input(s): DDIMER in the last 72 hours.  Cardiac Enzymes No results for input(s): CKMB, TROPONINI, MYOGLOBIN in the last 168 hours.  Invalid input(s): CK ------------------------------------------------------------------------------------------------------------------    Component Value Date/Time  BNP 48.6 04/24/2017 2059    Inpatient Medications  Scheduled Meds: . benzonatate  100 mg Oral TID  . chlorhexidine  15 mL Mouth Rinse BID  . docusate sodium  100 mg Oral BID  . fluticasone  2 spray Each Nare Daily  . guaiFENesin-dextromethorphan  5 mL Oral TID  . heparin subcutaneous  5,000 Units Subcutaneous Q8H  . hydrALAZINE  37.5 mg Oral BID  . hydrocerin   Topical BID  . insulin aspart  0-5 Units Subcutaneous QHS  . insulin aspart  0-9 Units Subcutaneous TID WC  . liver oil-zinc oxide   Topical TID  . mouth rinse  15 mL Mouth Rinse q12n4p  . potassium chloride  40 mEq Oral Once  . sodium chloride flush  3 mL Intravenous Q12H   Continuous Infusions: PRN Meds:.acetaminophen **OR** acetaminophen, bisacodyl, diphenhydrAMINE, hydrALAZINE, HYDROcodone-acetaminophen, ipratropium-albuterol, menthol-cetylpyridinium, ondansetron **OR** ondansetron (ZOFRAN) IV, senna-docusate  Micro Results No results found for this or any previous visit (from the past 240 hour(s)).  Radiology Reports Dg Chest 2 View  Result Date: 04/24/2017 CLINICAL DATA:  Increasing weakness EXAM: CHEST  2 VIEW COMPARISON:  03/14/2016 FINDINGS: Tracheostomy tube is again identified. Cardiac shadow is stable. The lungs are well aerated bilaterally. No focal infiltrate or sizable effusion is seen. Degenerative changes of the thoracic spine are noted. IMPRESSION: No acute abnormality seen. Electronically Signed   By: Alcide Clever M.D.   On: 04/24/2017 20:57   Dg Lumbar Spine Complete  Result Date: 04/24/2017 CLINICAL DATA:  Increasing weakness with inability to stand EXAM: LUMBAR SPINE - COMPLETE 4+ VIEW COMPARISON:  06/25/2015 FINDINGS: Five lumbar type vertebral bodies are well visualized. Vertebral body height is well maintained. Multilevel osteophytic changes are seen. Disc space narrowing is noted at multiple levels worst at L2-3 and L3-4. Aortic calcifications are noted. An IVC filter is noted in satisfactory  position. No overlying soft tissue abnormality is seen. IMPRESSION: Multilevel degenerative change without acute abnormality. Electronically Signed   By: Alcide Clever M.D.   On: 04/24/2017 20:58   Ct Head Wo Contrast  Result Date: 04/24/2017 CLINICAL DATA:  Increased weakness unable to stand EXAM: CT HEAD WITHOUT CONTRAST TECHNIQUE: Contiguous axial images were obtained from the base of the skull through the vertex without intravenous contrast. COMPARISON:  01/28/2015 FINDINGS: Brain: No acute territorial infarction, hemorrhage, or intracranial mass is seen. Mild small vessel ischemic changes of the white matter. Stable ventricle size. Mild atrophy. Vascular: No hyperdense vessels.  Carotid artery calcifications. Skull: No fracture or suspicious bone lesion. Sinuses/Orbits: No acute finding. Other: None IMPRESSION: No CT evidence for acute intracranial abnormality. Small vessel ischemic changes of the white matter and mild atrophy Electronically Signed   By: Jasmine Pang M.D.   On: 04/24/2017 21:02   US Renal  Result Date: 04/25/2017 CLINICAL DATA:  Acute renal insufficiency. EXAM: RENAL / URINARY TRACT ULTRASOUND COMPLETE COMPARISON:  CT scan June 24, 2015 FINDINGS: Right Kidney: Length: 12.9 cm. Echogenicity within normal limits. No mass or hydronephrosis visualized. Left Kidney: Length: 12.5 cm. There is a 6.3 x 4.2 x 5.7 cm hypoechoic structure along the medial aspect of the left renal midpole. A prominent extrarenal pelvis was seen in this region on previous CT imaging. Bladder: The bladder was empty and not well evaluated. IMPRESSION: 1. No cause for acute renal failure identified. 2. The 6.3 x 4.2 x 5.7 cm hypoechoic structure along the medial aspect of the left renal midpole probably correlates with a prominent extrarenal pelvis seen  on previous CT imaging. Electronically Signed   By: Gerome Sam III M.D   On: 04/25/2017 08:26   Dg Chest Port 1 View  Result Date: 04/27/2017 CLINICAL DATA:   Re- insertion of tracheostomy tube. EXAM: PORTABLE CHEST 1 VIEW COMPARISON:  04/24/2017 FINDINGS: The tip of a tracheostomy tube is seen centered over the superior mediastinum and upper trachea with tip terminating at the level of the aortic arch. Heart is enlarged with minimal aortic atherosclerosis. Mild vascular congestion is noted. No effusion or pneumothorax. No pulmonary consolidation. No acute nor suspicious osseous abnormality. IMPRESSION: 1. Cardiomegaly with mild pulmonary vascular congestion. 2. Minimal aortic atherosclerosis. 3. Tip of a tracheostomy tube is centered within the upper trachea with tip extending to the level of the aortic arch. Electronically Signed   By: Tollie Eth M.D.   On: 04/27/2017 00:21    Time Spent in minutes  15   Eddie North M.D on 05/15/2017 at 11:46 AM  Between 7am to 7pm - Pager - [phone]  After 7pm go to [email or web address] - password [facility]  [facility] -  Office  [phone]

## 2017-05-16 LAB — GLUCOSE, CAPILLARY
GLUCOSE-CAPILLARY: 116 mg/dL — AB (ref 65–99)
GLUCOSE-CAPILLARY: 109 mg/dL — AB (ref 65–99)
Glucose-Capillary: 98 mg/dL (ref 65–99)
Glucose-Capillary: 111 mg/dL — ABNORMAL HIGH (ref 65–99)

## 2017-05-16 NOTE — Progress Notes (Signed)
Assumed  care of the pt at 1500. Cont with plan of care. No changes in initial am assessment

## 2017-05-16 NOTE — Progress Notes (Signed)
PROGRESS NOTE                                                                                                                                                                                                             Patient Demographics:    [name], is a 66 y.o. male, DOB - 02/14/1951, ZOX:[id number]  Admit date - 04/24/2017   Admitting Physician Briscoe Deutscher, MD  Outpatient Primary MD for the patient is No primary care provider on file.  LOS - 22    Chief Complaint  Patient presents with  . Weakness       Brief Narrative  66 year old male with chronic respiratory failure, tracheostomy status, chronic diastolic CHF and COPD presented with weakness and being unable to get a from his couch at home. Patient was found to have sepsis secondary to UTI and acute kidney injury. Patient completed antibiotics on 8/25. Patient also having intermittent impulsive behavior in the hospital with episodic confusion. Patient evaluated by psychiatry and deemed not competent. Hospital course prolonged due to requirement of guardianship and placement.    Subjective:   No overnight events.  Assessment  & Plan :   Principal problem Acute metabolic encephalopathy with underlying dementia. Deemed not competent to make complex medical decision by psychiatry. Has not shown any agitation or aggressive outbursts for past 1 week. Sitter has been discontinued. TSH normal. Social worker awaiting guardianship paperwork from [employer]PS.  Sepsis secondary to UTI Resolved. Completed antibiotic course (Rocephin followed by Cipro)  Acute on chronic kidney disease stage II Secondary to sepsis and UTI. Resolved.  Hypokalemia Replenished   Morbid obesity with OHS/OSA and chronic hypoxic/ hypercapnic respiratory failure Stable. On chronic trach. His trach to be changed by respiratory therapist today. Continue pulmonary toilet. Nutrition  consult appreciated.  Chronic diastolic CHF Euvolemic  Type 2 diabetes mellitus A1c of 7.3. Continue sliding scale coverage.   Hyponatremia Secondary to dehydration. Resolved with fluids.  Essential hypertension Continue hydralazine. Stable.  Moisture associated intertriginous dermatitis Seen by wound care. Appreciate recommendation. Full-thickness areas on buttocks have improved but not completely healed.   Code Status : Full code  Family Communication  : Patient does not have any close family  Disposition Plan  : Awaiting SNF  Barriers For Discharge : Awaiting paperwork for disposition. expedited competency hearing scheduled 05/18/17 after which  interim guardian will be appointed who should then have that power/authority.  Consults  :  Psychiatry  Procedures  : None  DVT Prophylaxis  :  Sq Lovenox -   Lab Results  Component Value Date   PLT 278 05/12/2017    Antibiotics  :   Anti-infectives    Start     Dose/Rate Route Frequency Ordered Stop   05/01/17 0930  ciprofloxacin (CIPRO) tablet 500 mg  Status:  Discontinued     500 mg Oral 2 times daily 05/01/17 0924 05/09/17 1050   04/27/17 0900  fluconazole (DIFLUCAN) IVPB 100 mg  Status:  Discontinued     100 mg 50 mL/hr over 60 Minutes Intravenous Every 24 hours 04/27/17 0752 04/27/17 0754   04/27/17 0900  fluconazole (DIFLUCAN) IVPB 100 mg     100 mg 50 mL/hr over 60 Minutes Intravenous Every 24 hours 04/27/17 0754 04/28/17 1003   04/25/17 0315  cefTRIAXone (ROCEPHIN) 1 g in dextrose 5 % 50 mL IVPB  Status:  Discontinued     1 g 100 mL/hr over 30 Minutes Intravenous Daily at bedtime 04/25/17 0310 05/01/17 0924        Objective:   Vitals:   05/16/17 0047 05/16/17 0422 05/16/17 0445 05/16/17 0827  BP:  121/73    Pulse: 81 79 60 72  Resp: Temp:  98.2 F (36.8 C)    TempSrc:  Oral    SpO2: 95% 98% 95% 96%  Weight:  127.9 kg (281 lb 15.5 oz)    Height:        Wt Readings from Last 3  Encounters:  05/16/17 127.9 kg (281 lb 15.5 oz)  10/15/16 118.4 kg (261 lb)  09/14/16 113.4 kg (250 lb)     Intake/Output Summary (Last 24 hours) at 05/16/17 1058 Last data filed at 05/16/17 0917  Gross per 24 hour  Intake              120 ml  Output             1050 ml  Net             -930 ml     Physical Exam Gen.: Not in distress HEENT: Tracheostomy status Chest: Clear bilaterally  CVS: Normal S1 and S2 GI: Soft, nondistended, nontender     Data Review:    CBC  Recent Labs Lab 05/12/17 0400  WBC 7.4  HGB 12.2*  HCT 37.1*  PLT 278  MCV 85.3  MCH 28.0  MCHC 32.9  RDW 21.3*    Chemistries   Recent Labs Lab 05/12/17 0400 05/15/17 0459  NA 138 137  K 3.6 3.2*  CL 105 104  CO2 23 24  GLUCOSE 104* 101*  BUN 15 14  CREATININE 1.56* 1.51*  CALCIUM 9.2 9.1   ------------------------------------------------------------------------------------------------------------------ No results for input(s): CHOL, HDL, LDLCALC, TRIG, CHOLHDL, LDLDIRECT in the last 72 hours.  Lab Results  Component Value Date   HGBA1C 7.3 (H) 04/26/2017   ------------------------------------------------------------------------------------------------------------------ No results for input(s): TSH, T4TOTAL, T3FREE, THYROIDAB in the last 72 hours.  Invalid input(s): FREET3 ------------------------------------------------------------------------------------------------------------------ No results for input(s): VITAMINB12, FOLATE, FERRITIN, TIBC, IRON, RETICCTPCT in the last 72 hours.  Coagulation profile No results for input(s): INR, PROTIME in the last 168 hours.  No results for input(s): DDIMER in the last 72 hours.  Cardiac Enzymes No results for input(s): CKMB, TROPONINI, MYOGLOBIN in the last 168 hours.  Invalid input(s): CK ------------------------------------------------------------------------------------------------------------------  Component Value Date/Time     BNP 48.6 04/24/2017 2059    Inpatient Medications  Scheduled Meds: . benzonatate  100 mg Oral TID  . chlorhexidine  15 mL Mouth Rinse BID  . docusate sodium  100 mg Oral BID  . fluticasone  2 spray Each Nare Daily  . guaiFENesin-dextromethorphan  5 mL Oral TID  . heparin subcutaneous  5,000 Units Subcutaneous Q8H  . hydrALAZINE  37.5 mg Oral BID  . hydrocerin   Topical BID  . insulin aspart  0-5 Units Subcutaneous QHS  . insulin aspart  0-9 Units Subcutaneous TID WC  . liver oil-zinc oxide   Topical TID  . mouth rinse  15 mL Mouth Rinse q12n4p  . sodium chloride flush  3 mL Intravenous Q12H   Continuous Infusions: PRN Meds:.acetaminophen **OR** acetaminophen, bisacodyl, diphenhydrAMINE, hydrALAZINE, HYDROcodone-acetaminophen, ipratropium-albuterol, menthol-cetylpyridinium, ondansetron **OR** ondansetron (ZOFRAN) IV, senna-docusate  Micro Results No results found for this or any previous visit (from the past 240 hour(s)).  Radiology Reports Dg Chest 2 View  Result Date: 04/24/2017 CLINICAL DATA:  Increasing weakness EXAM: CHEST  2 VIEW COMPARISON:  03/14/2016 FINDINGS: Tracheostomy tube is again identified. Cardiac shadow is stable. The lungs are well aerated bilaterally. No focal infiltrate or sizable effusion is seen. Degenerative changes of the thoracic spine are noted. IMPRESSION: No acute abnormality seen. Electronically Signed   By: Alcide CleverMark  Lukens M.D.   On: 04/24/2017 20:57   Dg Lumbar Spine Complete  Result Date: 04/24/2017 CLINICAL DATA:  Increasing weakness with inability to stand EXAM: LUMBAR SPINE - COMPLETE 4+ VIEW COMPARISON:  06/25/2015 FINDINGS: Five lumbar type vertebral bodies are well visualized. Vertebral body height is well maintained. Multilevel osteophytic changes are seen. Disc space narrowing is noted at multiple levels worst at L2-3 and L3-4. Aortic calcifications are noted. An IVC filter is noted in satisfactory position. No overlying soft tissue  abnormality is seen. IMPRESSION: Multilevel degenerative change without acute abnormality. Electronically Signed   By: Alcide CleverMark  Lukens M.D.   On: 04/24/2017 20:58   Ct Head Wo Contrast  Result Date: 04/24/2017 CLINICAL DATA:  Increased weakness unable to stand EXAM: CT HEAD WITHOUT CONTRAST TECHNIQUE: Contiguous axial images were obtained from the base of the skull through the vertex without intravenous contrast. COMPARISON:  01/28/2015 FINDINGS: Brain: No acute territorial infarction, hemorrhage, or intracranial mass is seen. Mild small vessel ischemic changes of the white matter. Stable ventricle size. Mild atrophy. Vascular: No hyperdense vessels.  Carotid artery calcifications. Skull: No fracture or suspicious bone lesion. Sinuses/Orbits: No acute finding. Other: None IMPRESSION: No CT evidence for acute intracranial abnormality. Small vessel ischemic changes of the white matter and mild atrophy Electronically Signed   By: Jasmine PangKim  Fujinaga M.D.   On: 04/24/2017 21:02   [place] Renal  Result Date: 04/25/2017 CLINICAL DATA:  Acute renal insufficiency. EXAM: RENAL / URINARY TRACT ULTRASOUND COMPLETE COMPARISON:  CT scan June 24, 2015 FINDINGS: Right Kidney: Length: 12.9 cm. Echogenicity within normal limits. No mass or hydronephrosis visualized. Left Kidney: Length: 12.5 cm. There is a 6.3 x 4.2 x 5.7 cm hypoechoic structure along the medial aspect of the left renal midpole. A prominent extrarenal pelvis was seen in this region on previous CT imaging. Bladder: The bladder was empty and not well evaluated. IMPRESSION: 1. No cause for acute renal failure identified. 2. The 6.3 x 4.2 x 5.7 cm hypoechoic structure along the medial aspect of the left renal midpole probably correlates with a prominent extrarenal pelvis seen on previous  CT imaging. Electronically Signed   By: Gerome Sam III M.D   On: 04/25/2017 08:26   Dg Chest Port 1 View  Result Date: 04/27/2017 CLINICAL DATA:  Re- insertion of tracheostomy  tube. EXAM: PORTABLE CHEST 1 VIEW COMPARISON:  04/24/2017 FINDINGS: The tip of a tracheostomy tube is seen centered over the superior mediastinum and upper trachea with tip terminating at the level of the aortic arch. Heart is enlarged with minimal aortic atherosclerosis. Mild vascular congestion is noted. No effusion or pneumothorax. No pulmonary consolidation. No acute nor suspicious osseous abnormality. IMPRESSION: 1. Cardiomegaly with mild pulmonary vascular congestion. 2. Minimal aortic atherosclerosis. 3. Tip of a tracheostomy tube is centered within the upper trachea with tip extending to the level of the aortic arch. Electronically Signed   By: Tollie Eth M.D.   On: 04/27/2017 00:21    Time Spent in minutes  15   Eddie North M.D on 05/16/2017 at 10:58 AM  Between 7am to 7pm - Pager - [phone]  After 7pm go to [email or web address] - password [facility]  [facility] -  Office  [phone]

## 2017-05-16 NOTE — Progress Notes (Signed)
Patient reminded stable during shift. Medications were administered per orders. Patient declined use of the ordered DESITIN. Continue plan of care.

## 2017-05-17 LAB — GLUCOSE, CAPILLARY
GLUCOSE-CAPILLARY: 120 mg/dL — AB (ref 65–99)
Glucose-Capillary: 154 mg/dL — ABNORMAL HIGH (ref 65–99)
Glucose-Capillary: 125 mg/dL — ABNORMAL HIGH (ref 65–99)
Glucose-Capillary: 119 mg/dL — ABNORMAL HIGH (ref 65–99)

## 2017-05-17 NOTE — Progress Notes (Signed)
PT Cancellation Note  Patient Details Name: [name] MRN: [id number] DOB: 23-May-1951   Cancelled Treatment:     Several attempts to encourage pt OOB.  "I'm sleeping" stated pt. Will continue to attempt as schedule permits.  Pt has been evaulated with rec for SNF   Felecia ShellingLori Nakeda Lebron  PTA [facility]  Acute  Rehab Pager      [phone]

## 2017-05-17 NOTE — Progress Notes (Signed)
Nutrition Follow-up  DOCUMENTATION CODES:   Morbid obesity  INTERVENTION:   Magic cup TID with meals, each supplement provides 290 kcal and 9 grams of protein  NUTRITION DIAGNOSIS:   Increased nutrient needs related to wound healing as evidenced by estimated needs.  Ongoing  GOAL:   Patient will meet greater than or equal to 90% of their needs  Progressing  MONITOR:   PO intake, Supplement acceptance, Labs, Skin  REASON FOR ASSESSMENT:   Low Braden    ASSESSMENT:   Pt with PMH of CHF, COPD, HLD, CKD II, and HTN. Complains of increased weakness over the past few months. Reports over the past two weeks he has been immobile laying on a couch incontinent of stool and urine. Presents this admission with AKI superimposed on CKD II.   Pt awaiting guardianship and placement to SNF. MD notes: Expedited competency hearing scheduled 05/18/17 after which interim guardian will be appointed who should then have that power/authority. Spoke with pt at bedside. Reports appetite remains adequate. Meal completions noted to be 60% average for last 8 meals. Pt consuming Magic Cups TID. Wt shows to be stable for last 10 days. Will continue to monitor PO intake and supplement acceptance.   Medications reviewed and include: SSI Labs reviewed: K 3.2 (L) Creatinine 1.51 (H)   Diet Order:  Diet heart healthy/carb modified Room service appropriate? Yes; Fluid consistency: Thin  Skin:   (stg III buttocks stg II buttocks)  Last BM:  05/14/17  Height:   Ht Readings from Last 1 Encounters:  05/03/17 6' (1.829 m)    Weight:   Wt Readings from Last 1 Encounters:  05/17/17 281 lb 1.4 oz (127.5 kg)    Ideal Body Weight:  78.2 kg  BMI:  Body mass index is 38.12 kg/m.  Estimated Nutritional Needs:   Kcal:  2155 (MSJ)  Protein:  110-120 grams (1.4-1.5 g/kg IBW)  Fluid:  >2.1 L/day  EDUCATION NEEDS:   No education needs identified at this time  Vanessa Kickarly Montrey Buist RD, LDN Clinical  Nutrition Pager # - [phone]

## 2017-05-17 NOTE — Progress Notes (Signed)
Respiratory Care Note:  Trach management  All equipment and supplies at beside for trach care and suctioning.  No education needed at this time. Additional trach and ambu bag in the room.  Obturator at head of bed.

## 2017-05-17 NOTE — Progress Notes (Signed)
CSW following for placement to SNF.  Hearing for guardianship on 05/18/17.

## 2017-05-17 NOTE — Progress Notes (Signed)
PROGRESS NOTE                                                                                                                                                                                                             Patient Demographics:    [name], is a 66 y.o. male, DOB - Nov 26, 1950, ZOX:[id number]  Admit date - 04/24/2017   Admitting Physician Briscoe Deutscher, MD  Outpatient Primary MD for the patient is No primary care provider on file.  LOS - 23    Chief Complaint  Patient presents with  . Weakness       Brief Narrative  66 year old male with chronic respiratory failure, tracheostomy status, chronic diastolic CHF and COPD presented with weakness and being unable to get a from his couch at home. Patient was found to have sepsis secondary to UTI and acute kidney injury. Patient completed antibiotics on 8/25. Patient also having intermittent impulsive behavior in the hospital with episodic confusion. Patient evaluated by psychiatry and deemed not competent. Hospital course prolonged due to requirement of guardianship and placement.    Subjective:   No overnight events.  Assessment  & Plan :   Please review progress note from 9/9 for details.  Principal problem Acute metabolic encephalopathy with underlying dementia.  not competent to make complex medical decision by psychiatry.Mentation stable. Awaiting guardianship. Lower for placement.  Morbid obesity with OHS/OSA and chronic hypoxic/ hypercapnic respiratory failure Stable. On chronic trach. Trach chain by respiratory therapist last week. Continue pulmonary toilet.   Type 2 diabetes mellitus A1c of 7.3. Continue sliding scale coverage.   Code Status : Full code  Family Communication  : Patient does not have any close family  Disposition Plan  : Awaiting SNF  Barriers For Discharge : Awaiting paperwork for disposition. expedited competency  hearing scheduled 05/18/17 after which interim guardian will be appointed who should then have that power/authority.  Consults  :  Psychiatry  Procedures  : None  DVT Prophylaxis  :  Sq Lovenox -   Lab Results  Component Value Date   PLT 278 05/12/2017    Antibiotics  :   Anti-infectives    Start     Dose/Rate Route Frequency Ordered Stop   05/01/17 0930  ciprofloxacin (CIPRO) tablet 500 mg  Status:  Discontinued     500 mg  Oral 2 times daily 05/01/17 0924 05/09/17 1050   04/27/17 0900  fluconazole (DIFLUCAN) IVPB 100 mg  Status:  Discontinued     100 mg 50 mL/hr over 60 Minutes Intravenous Every 24 hours 04/27/17 0752 04/27/17 0754   04/27/17 0900  fluconazole (DIFLUCAN) IVPB 100 mg     100 mg 50 mL/hr over 60 Minutes Intravenous Every 24 hours 04/27/17 0754 04/28/17 1003   04/25/17 0315  cefTRIAXone (ROCEPHIN) 1 g in dextrose 5 % 50 mL IVPB  Status:  Discontinued     1 g 100 mL/hr over 30 Minutes Intravenous Daily at bedtime 04/25/17 0310 05/01/17 0924        Objective:   Vitals:   05/16/17 2345 05/17/17 0400 05/17/17 0409 05/17/17 0913  BP:   114/60   Pulse: 70 74 76 67  Resp: 14 16 18 18   Temp:   98.3 F (36.8 C)   TempSrc:   Oral   SpO2: 98% 98% 98%   Weight:   127.5 kg (281 lb 1.4 oz)   Height:        Wt Readings from Last 3 Encounters:  05/17/17 127.5 kg (281 lb 1.4 oz)  10/15/16 118.4 kg (261 lb)  09/14/16 113.4 kg (250 lb)     Intake/Output Summary (Last 24 hours) at 05/17/17 1128 Last data filed at 05/17/17 0414  Gross per 24 hour  Intake                0 ml  Output              700 ml  Net             -700 ml     Physical Exam Gen.: Not in distress HEENT: Tracheostomy status Chest: Clear bilaterally  CVS: Normal S1 and S2 GI: Soft, nondistended, nontender     Data Review:    CBC  Recent Labs Lab 05/12/17 0400  WBC 7.4  HGB 12.2*  HCT 37.1*  PLT 278  MCV 85.3  MCH 28.0  MCHC 32.9  RDW 21.3*    Chemistries   Recent  Labs Lab 05/12/17 0400 05/15/17 0459  NA 138 137  K 3.6 3.2*  CL 105 104  CO2 23 24  GLUCOSE 104* 101*  BUN 15 14  CREATININE 1.56* 1.51*  CALCIUM 9.2 9.1   ------------------------------------------------------------------------------------------------------------------ No results for input(s): CHOL, HDL, LDLCALC, TRIG, CHOLHDL, LDLDIRECT in the last 72 hours.  Lab Results  Component Value Date   HGBA1C 7.3 (H) 04/26/2017   ------------------------------------------------------------------------------------------------------------------ No results for input(s): TSH, T4TOTAL, T3FREE, THYROIDAB in the last 72 hours.  Invalid input(s): FREET3 ------------------------------------------------------------------------------------------------------------------ No results for input(s): VITAMINB12, FOLATE, FERRITIN, TIBC, IRON, RETICCTPCT in the last 72 hours.  Coagulation profile No results for input(s): INR, PROTIME in the last 168 hours.  No results for input(s): DDIMER in the last 72 hours.  Cardiac Enzymes No results for input(s): CKMB, TROPONINI, MYOGLOBIN in the last 168 hours.  Invalid input(s): CK ------------------------------------------------------------------------------------------------------------------    Component Value Date/Time   BNP 48.6 04/24/2017 2059    Inpatient Medications  Scheduled Meds: . benzonatate  100 mg Oral TID  . chlorhexidine  15 mL Mouth Rinse BID  . docusate sodium  100 mg Oral BID  . fluticasone  2 spray Each Nare Daily  . guaiFENesin-dextromethorphan  5 mL Oral TID  . heparin subcutaneous  5,000 Units Subcutaneous Q8H  . hydrALAZINE  37.5 mg Oral BID  . hydrocerin   Topical BID  .  insulin aspart  0-5 Units Subcutaneous QHS  . insulin aspart  0-9 Units Subcutaneous TID WC  . liver oil-zinc oxide   Topical TID  . mouth rinse  15 mL Mouth Rinse q12n4p  . sodium chloride flush  3 mL Intravenous Q12H   Continuous Infusions: PRN  Meds:.acetaminophen **OR** acetaminophen, bisacodyl, diphenhydrAMINE, hydrALAZINE, HYDROcodone-acetaminophen, ipratropium-albuterol, menthol-cetylpyridinium, ondansetron **OR** ondansetron (ZOFRAN) IV, senna-docusate  Micro Results No results found for this or any previous visit (from the past 240 hour(s)).  Radiology Reports Dg Chest 2 View  Result Date: 04/24/2017 CLINICAL DATA:  Increasing weakness EXAM: CHEST  2 VIEW COMPARISON:  03/14/2016 FINDINGS: Tracheostomy tube is again identified. Cardiac shadow is stable. The lungs are well aerated bilaterally. No focal infiltrate or sizable effusion is seen. Degenerative changes of the thoracic spine are noted. IMPRESSION: No acute abnormality seen. Electronically Signed   By: Alcide Clever M.D.   On: 04/24/2017 20:57   Dg Lumbar Spine Complete  Result Date: 04/24/2017 CLINICAL DATA:  Increasing weakness with inability to stand EXAM: LUMBAR SPINE - COMPLETE 4+ VIEW COMPARISON:  06/25/2015 FINDINGS: Five lumbar type vertebral bodies are well visualized. Vertebral body height is well maintained. Multilevel osteophytic changes are seen. Disc space narrowing is noted at multiple levels worst at L2-3 and L3-4. Aortic calcifications are noted. An IVC filter is noted in satisfactory position. No overlying soft tissue abnormality is seen. IMPRESSION: Multilevel degenerative change without acute abnormality. Electronically Signed   By: Alcide Clever M.D.   On: 04/24/2017 20:58   Ct Head Wo Contrast  Result Date: 04/24/2017 CLINICAL DATA:  Increased weakness unable to stand EXAM: CT HEAD WITHOUT CONTRAST TECHNIQUE: Contiguous axial images were obtained from the base of the skull through the vertex without intravenous contrast. COMPARISON:  01/28/2015 FINDINGS: Brain: No acute territorial infarction, hemorrhage, or intracranial mass is seen. Mild small vessel ischemic changes of the white matter. Stable ventricle size. Mild atrophy. Vascular: No hyperdense vessels.   Carotid artery calcifications. Skull: No fracture or suspicious bone lesion. Sinuses/Orbits: No acute finding. Other: None IMPRESSION: No CT evidence for acute intracranial abnormality. Small vessel ischemic changes of the white matter and mild atrophy Electronically Signed   By: Jasmine Pang M.D.   On: 04/24/2017 21:02   US Renal  Result Date: 04/25/2017 CLINICAL DATA:  Acute renal insufficiency. EXAM: RENAL / URINARY TRACT ULTRASOUND COMPLETE COMPARISON:  CT scan June 24, 2015 FINDINGS: Right Kidney: Length: 12.9 cm. Echogenicity within normal limits. No mass or hydronephrosis visualized. Left Kidney: Length: 12.5 cm. There is a 6.3 x 4.2 x 5.7 cm hypoechoic structure along the medial aspect of the left renal midpole. A prominent extrarenal pelvis was seen in this region on previous CT imaging. Bladder: The bladder was empty and not well evaluated. IMPRESSION: 1. No cause for acute renal failure identified. 2. The 6.3 x 4.2 x 5.7 cm hypoechoic structure along the medial aspect of the left renal midpole probably correlates with a prominent extrarenal pelvis seen on previous CT imaging. Electronically Signed   By: Gerome Sam III M.D   On: 04/25/2017 08:26   Dg Chest Port 1 View  Result Date: 04/27/2017 CLINICAL DATA:  Re- insertion of tracheostomy tube. EXAM: PORTABLE CHEST 1 VIEW COMPARISON:  04/24/2017 FINDINGS: The tip of a tracheostomy tube is seen centered over the superior mediastinum and upper trachea with tip terminating at the level of the aortic arch. Heart is enlarged with minimal aortic atherosclerosis. Mild vascular congestion is noted. No effusion or  pneumothorax. No pulmonary consolidation. No acute nor suspicious osseous abnormality. IMPRESSION: 1. Cardiomegaly with mild pulmonary vascular congestion. 2. Minimal aortic atherosclerosis. 3. Tip of a tracheostomy tube is centered within the upper trachea with tip extending to the level of the aortic arch. Electronically Signed   By:  Tollie Eth M.D.   On: 04/27/2017 00:21    Time Spent in minutes  15   Eddie North M.D on 05/17/2017 at 11:28 AM  Between 7am to 7pm - Pager - [phone]  After 7pm go to [email or web address] - password [facility]  [facility] -  Office  [phone]

## 2017-05-18 LAB — GLUCOSE, CAPILLARY
GLUCOSE-CAPILLARY: 109 mg/dL — AB (ref 65–99)
Glucose-Capillary: 109 mg/dL — ABNORMAL HIGH (ref 65–99)
Glucose-Capillary: 110 mg/dL — ABNORMAL HIGH (ref 65–99)
Glucose-Capillary: 107 mg/dL — ABNORMAL HIGH (ref 65–99)

## 2017-05-18 LAB — CBC
HEMATOCRIT: 33.9 % — AB (ref 39.0–52.0)
HEMOGLOBIN: 11.1 g/dL — AB (ref 13.0–17.0)
MCH: 27.9 pg (ref 26.0–34.0)
MCHC: 32.7 g/dL (ref 30.0–36.0)
MCV: 85.2 fL (ref 78.0–100.0)
Platelets: 329 10*3/uL (ref 150–400)
RBC: 3.98 MIL/uL — ABNORMAL LOW (ref 4.22–5.81)
RDW: 21 % — ABNORMAL HIGH (ref 11.5–15.5)
WBC: 7.8 10*3/uL (ref 4.0–10.5)

## 2017-05-18 NOTE — Progress Notes (Signed)
CSW contacted patient's guardian ad litem to inquire about the court hearing regarding patient's guardianship. Patient's guardian ad litem reported that they did not have the hearing because the sheriff did not serve the patient with petition for adjudication as a competence notice of hearing for the interim guardianship hearing and the full guardianship hearing and the motion for an interim guardian. He also reported that the county attorney did not make it. Patient's guardian ad litem reported that he went ahead and filed a notice of hearing and that the hearing is scheduled for this Thursday 05/20/17 at 1pm. He reported that he should have more information after the hearing. CSW awaiting patient's hearing to move forward with SNF placement.   Celso SickleKimberly Eyvonne Burchfield, [place] Clinical Social Worker [facility] Cell#: [phone]

## 2017-05-18 NOTE — Progress Notes (Signed)
Physical Therapy Treatment Patient Details Name: Fayrene FearingJames Marcus MRN: [id number] DOB: 01/22/1951 Today's Date: 05/18/2017    History of Present Illness  Fayrene FearingJames Kuehl is a 66 y.o. male with medical history significant for COPD, chronic diastolic CHF, hypertension, morbid obesity, chronic kidney disease stage II, and OSA, into the emergency department for evaluation of generalized weakness--per chart pt has been too weak to get off the couch and has been urinating and defecating on himself for days.  Diagnosed with sepsis due to UTI, acute encephalopathy    PT Comments    Pt sitting EOB naked with just a blanket wrapped around.  Assisted with donning a gaown and footies then amb a limited but increased distance in hallway.  B LE edema much improved.    Follow Up Recommendations  SNF     Equipment Recommendations  None recommended by PT    Recommendations for Other Services       Precautions / Restrictions Precautions Precaution Comments: chronic trach with PMV Restrictions Weight Bearing Restrictions: No    Mobility  Bed Mobility               General bed mobility comments: sitting EOB Indep naked on arrival  Transfers Overall transfer level: Needs assistance Equipment used: Rolling walker (2 wheeled) Transfers: Sit to/from Stand Sit to Stand: Max assist         General transfer comment: 25% VC's on safety with turns  Ambulation/Gait Ambulation/Gait assistance: Supervision;Min guard Ambulation Distance (Feet): 65 Feet Assistive device: Rolling walker (2 wheeled) Gait Pattern/deviations: Step-to pattern;Step-through pattern;Decreased step length - right;Decreased step length - left Gait velocity: decreased   General Gait Details: very unsteady with R LE weaker than L LE.  Limited distance due to weakness/fatigue but did tolerate an increased distance   Careers information officertairs            Wheelchair Mobility    Modified Rankin (Stroke Patients Only)       Balance                                             Cognition Arousal/Alertness: Awake/alert Behavior During Therapy: WFL for tasks assessed/performed Overall Cognitive Status: Within Functional Limits for tasks assessed                                 General Comments: loves to talk and tends to procrastinate      Exercises      General Comments        Pertinent Vitals/Pain Pain Assessment: No/denies pain    Home Living                      Prior Function            PT Goals ([place] goals can now be found in the care plan section) Progress towards PT goals: Progressing toward goals    Frequency    Min 2X/week      PT Plan [place] plan remains appropriate    Co-evaluation              AM-PAC PT "6 Clicks" Daily Activity  Outcome Measure  Difficulty turning over in bed (including adjusting bedclothes, sheets and blankets)?: A Little Difficulty moving from lying on back to sitting on the side of the bed? :  A Little Difficulty sitting down on and standing up from a chair with arms (e.g., wheelchair, bedside commode, etc,.)?: Unable Help needed moving to and from a bed to chair (including a wheelchair)?: A Lot Help needed walking in hospital room?: Total Help needed climbing 3-5 steps with a railing? : Total 6 Click Score: 11    End of Session Equipment Utilized During Treatment: Gait belt Activity Tolerance: Patient limited by fatigue Patient left: in bed;with call bell/phone within reach Nurse Communication: Mobility status PT Visit Diagnosis: Muscle weakness (generalized) (M62.81);Difficulty in walking, not elsewhere classified (R26.2)     Time: 0981-1914 PT Time Calculation (min) (ACUTE ONLY): 23 min  Charges:  $Gait Training: 8-22 mins $Therapeutic Exercise: 8-22 mins                    G Codes:       Felecia Shelling  PTA [facility]  Acute  Rehab Pager      [phone]

## 2017-05-18 NOTE — Progress Notes (Signed)
PROGRESS NOTE                                                                                                                                                                                                             Patient Demographics:    [name], is a 66 y.o. male, DOB - May 14, 1951, ZOX:[id number]  Admit date - 04/24/2017   Admitting Physician Briscoe Deutscher, MD  Outpatient Primary MD for the patient is No primary care provider on file.  LOS - 24    Chief Complaint  Patient presents with  . Weakness       Brief Narrative  66 year old male with chronic respiratory failure, tracheostomy status, chronic diastolic CHF and COPD presented with weakness and being unable to get a from his couch at home. Patient was found to have sepsis secondary to UTI and acute kidney injury. Patient completed antibiotics on 8/25. Patient also having intermittent impulsive behavior in the hospital with episodic confusion. Patient evaluated by psychiatry and deemed not competent. Hospital course prolonged due to requirement of guardianship and placement.    Subjective:   No overnight events.  Assessment  & Plan :   Principal problem Acute metabolic encephalopathy with underlying dementia. Deemed not competent to make complex medical decision by psychiatry. Has not shown any agitation or aggressive outbursts for past 1 week. Sitter has been discontinued. TSH normal. Social worker awaiting guardianship paperwork from APS.  Sepsis secondary to UTI Resolved. Completed antibiotic course (Rocephin followed by Cipro)  Acute on chronic kidney disease stage II Secondary to sepsis and UTI. Resolved.  Hypokalemia Replenished   Morbid obesity with OHS/OSA and chronic hypoxic/ hypercapnic respiratory failure Stable. On chronic trach. His trach to be changed by respiratory therapist today. Continue pulmonary toilet. Nutrition  consult appreciated.  Chronic diastolic CHF Euvolemic  Type 2 diabetes mellitus A1c of 7.3. Continue sliding scale coverage.   Hyponatremia Secondary to dehydration. Resolved with fluids.  Essential hypertension Continue hydralazine. Stable.  Moisture associated intertriginous dermatitis Seen by wound care. Appreciate recommendation. Full-thickness areas on buttocks have improved but not completely healed.   Code Status : Full code  Family Communication  : Patient does not have any close family  Disposition Plan  : Awaiting SNF  Barriers For Discharge : Awaiting paperwork for disposition. expedited competency hearing scheduled today (9/11)  after which interim guardian will be appointed who should then have that power/authority.  Consults  :  Psychiatry  Procedures  : None  DVT Prophylaxis  :  Sq Lovenox -   Lab Results  Component Value Date   PLT 278 05/12/2017    Antibiotics  :   Anti-infectives    Start     Dose/Rate Route Frequency Ordered Stop   05/01/17 0930  ciprofloxacin (CIPRO) tablet 500 mg  Status:  Discontinued     500 mg Oral 2 times daily 05/01/17 0924 05/09/17 1050   04/27/17 0900  fluconazole (DIFLUCAN) IVPB 100 mg  Status:  Discontinued     100 mg 50 mL/hr over 60 Minutes Intravenous Every 24 hours 04/27/17 0752 04/27/17 0754   04/27/17 0900  fluconazole (DIFLUCAN) IVPB 100 mg     100 mg 50 mL/hr over 60 Minutes Intravenous Every 24 hours 04/27/17 0754 04/28/17 1003   04/25/17 0315  cefTRIAXone (ROCEPHIN) 1 g in dextrose 5 % 50 mL IVPB  Status:  Discontinued     1 g 100 mL/hr over 30 Minutes Intravenous Daily at bedtime 04/25/17 0310 05/01/17 0924        Objective:   Vitals:   05/18/17 0439 05/18/17 0613 05/18/17 0822 05/18/17 1100  BP:  102/69    Pulse:  (!) 53 74 71  Resp: Temp:  98.9 F (37.2 C)    TempSrc:  Oral    SpO2:  93% 94% 94%  Weight:  128.1 kg (282 lb 6.6 oz)    Height:        Wt Readings from Last 3  Encounters:  05/18/17 128.1 kg (282 lb 6.6 oz)  10/15/16 118.4 kg (261 lb)  09/14/16 113.4 kg (250 lb)     Intake/Output Summary (Last 24 hours) at 05/18/17 1428 Last data filed at 05/18/17 1000  Gross per 24 hour  Intake             1080 ml  Output             1300 ml  Net             -220 ml     Physical Exam Gen.: Not in distress HEENT: Tracheostomy status Chest: Clear bilaterally  CVS: Normal S1 and S2 GI: Soft, nondistended, nontender     Data Review:    CBC  Recent Labs Lab 05/12/17 0400  WBC 7.4  HGB 12.2*  HCT 37.1*  PLT 278  MCV 85.3  MCH 28.0  MCHC 32.9  RDW 21.3*    Chemistries   Recent Labs Lab 05/12/17 0400 05/15/17 0459  NA 138 137  K 3.6 3.2*  CL 105 104  CO2 23 24  GLUCOSE 104* 101*  BUN 15 14  CREATININE 1.56* 1.51*  CALCIUM 9.2 9.1   ------------------------------------------------------------------------------------------------------------------ No results for input(s): CHOL, HDL, LDLCALC, TRIG, CHOLHDL, LDLDIRECT in the last 72 hours.  Lab Results  Component Value Date   HGBA1C 7.3 (H) 04/26/2017   ------------------------------------------------------------------------------------------------------------------ No results for input(s): TSH, T4TOTAL, T3FREE, THYROIDAB in the last 72 hours.  Invalid input(s): FREET3 ------------------------------------------------------------------------------------------------------------------ No results for input(s): VITAMINB12, FOLATE, FERRITIN, TIBC, IRON, RETICCTPCT in the last 72 hours.  Coagulation profile No results for input(s): INR, PROTIME in the last 168 hours.  No results for input(s): DDIMER in the last 72 hours.  Cardiac Enzymes No results for input(s): CKMB, TROPONINI, MYOGLOBIN in the last 168 hours.  Invalid input(s): CK ------------------------------------------------------------------------------------------------------------------  Component Value Date/Time    BNP 48.6 04/24/2017 2059    Inpatient Medications  Scheduled Meds: . benzonatate  100 mg Oral TID  . chlorhexidine  15 mL Mouth Rinse BID  . docusate sodium  100 mg Oral BID  . fluticasone  2 spray Each Nare Daily  . guaiFENesin-dextromethorphan  5 mL Oral TID  . heparin subcutaneous  5,000 Units Subcutaneous Q8H  . hydrALAZINE  37.5 mg Oral BID  . hydrocerin   Topical BID  . insulin aspart  0-5 Units Subcutaneous QHS  . insulin aspart  0-9 Units Subcutaneous TID WC  . liver oil-zinc oxide   Topical TID  . mouth rinse  15 mL Mouth Rinse q12n4p  . sodium chloride flush  3 mL Intravenous Q12H   Continuous Infusions: PRN Meds:.acetaminophen **OR** acetaminophen, bisacodyl, diphenhydrAMINE, hydrALAZINE, HYDROcodone-acetaminophen, ipratropium-albuterol, menthol-cetylpyridinium, ondansetron **OR** ondansetron (ZOFRAN) IV, senna-docusate  Micro Results No results found for this or any previous visit (from the past 240 hour(s)).  Radiology Reports Dg Chest 2 View  Result Date: 04/24/2017 CLINICAL DATA:  Increasing weakness EXAM: CHEST  2 VIEW COMPARISON:  03/14/2016 FINDINGS: Tracheostomy tube is again identified. Cardiac shadow is stable. The lungs are well aerated bilaterally. No focal infiltrate or sizable effusion is seen. Degenerative changes of the thoracic spine are noted. IMPRESSION: No acute abnormality seen. Electronically Signed   By: Alcide Clever M.D.   On: 04/24/2017 20:57   Dg Lumbar Spine Complete  Result Date: 04/24/2017 CLINICAL DATA:  Increasing weakness with inability to stand EXAM: LUMBAR SPINE - COMPLETE 4+ VIEW COMPARISON:  06/25/2015 FINDINGS: Five lumbar type vertebral bodies are well visualized. Vertebral body height is well maintained. Multilevel osteophytic changes are seen. Disc space narrowing is noted at multiple levels worst at L2-3 and L3-4. Aortic calcifications are noted. An IVC filter is noted in satisfactory position. No overlying soft tissue abnormality  is seen. IMPRESSION: Multilevel degenerative change without acute abnormality. Electronically Signed   By: Alcide Clever M.D.   On: 04/24/2017 20:58   Ct Head Wo Contrast  Result Date: 04/24/2017 CLINICAL DATA:  Increased weakness unable to stand EXAM: CT HEAD WITHOUT CONTRAST TECHNIQUE: Contiguous axial images were obtained from the base of the skull through the vertex without intravenous contrast. COMPARISON:  01/28/2015 FINDINGS: Brain: No acute territorial infarction, hemorrhage, or intracranial mass is seen. Mild small vessel ischemic changes of the white matter. Stable ventricle size. Mild atrophy. Vascular: No hyperdense vessels.  Carotid artery calcifications. Skull: No fracture or suspicious bone lesion. Sinuses/Orbits: No acute finding. Other: None IMPRESSION: No CT evidence for acute intracranial abnormality. Small vessel ischemic changes of the white matter and mild atrophy Electronically Signed   By: Jasmine Pang M.D.   On: 04/24/2017 21:02   US Renal  Result Date: 04/25/2017 CLINICAL DATA:  Acute renal insufficiency. EXAM: RENAL / URINARY TRACT ULTRASOUND COMPLETE COMPARISON:  CT scan June 24, 2015 FINDINGS: Right Kidney: Length: 12.9 cm. Echogenicity within normal limits. No mass or hydronephrosis visualized. Left Kidney: Length: 12.5 cm. There is a 6.3 x 4.2 x 5.7 cm hypoechoic structure along the medial aspect of the left renal midpole. A prominent extrarenal pelvis was seen in this region on previous CT imaging. Bladder: The bladder was empty and not well evaluated. IMPRESSION: 1. No cause for acute renal failure identified. 2. The 6.3 x 4.2 x 5.7 cm hypoechoic structure along the medial aspect of the left renal midpole probably correlates with a prominent extrarenal pelvis seen on previous CT  imaging. Electronically Signed   By: Gerome Samavid  Williams III M.D   On: 04/25/2017 08:26   Dg Chest Port 1 View  Result Date: 04/27/2017 CLINICAL DATA:  Re- insertion of tracheostomy tube. EXAM:  PORTABLE CHEST 1 VIEW COMPARISON:  04/24/2017 FINDINGS: The tip of a tracheostomy tube is seen centered over the superior mediastinum and upper trachea with tip terminating at the level of the aortic arch. Heart is enlarged with minimal aortic atherosclerosis. Mild vascular congestion is noted. No effusion or pneumothorax. No pulmonary consolidation. No acute nor suspicious osseous abnormality. IMPRESSION: 1. Cardiomegaly with mild pulmonary vascular congestion. 2. Minimal aortic atherosclerosis. 3. Tip of a tracheostomy tube is centered within the upper trachea with tip extending to the level of the aortic arch. Electronically Signed   By: Tollie Ethavid  Kwon M.D.   On: 04/27/2017 00:21    Time Spent in minutes  15   [name], Dontavian Marchi M.D on 05/18/2017 at 2:28 PM  Between 7am to 7pm - Pager - [phone]  After 7pm go to [email or web address] - password [email or web address]  [facility] -  Office  [phone]

## 2017-05-18 NOTE — Progress Notes (Signed)
Patient with what appears to be blood in stool. Patient stated when he wiped after a BM there was blood on the tissue. MD on call notified. New orders placed. Will continue to monitor closely

## 2017-05-19 ENCOUNTER — Inpatient Hospital Stay (HOSPITAL_COMMUNITY): Payer: Medicaid Other

## 2017-05-19 LAB — GLUCOSE, CAPILLARY
GLUCOSE-CAPILLARY: 99 mg/dL (ref 65–99)
Glucose-Capillary: 141 mg/dL — ABNORMAL HIGH (ref 65–99)
Glucose-Capillary: 109 mg/dL — ABNORMAL HIGH (ref 65–99)
Glucose-Capillary: 120 mg/dL — ABNORMAL HIGH (ref 65–99)

## 2017-05-19 LAB — BASIC METABOLIC PANEL
ANION GAP: 8 (ref 5–15)
BUN: 19 mg/dL (ref 6–20)
CALCIUM: 8.9 mg/dL (ref 8.9–10.3)
CO2: 23 mmol/L (ref 22–32)
Chloride: 106 mmol/L (ref 101–111)
Creatinine, Ser: 1.58 mg/dL — ABNORMAL HIGH (ref 0.61–1.24)
GFR, EST AFRICAN AMERICAN: 52 mL/min — AB (ref >60)
GFR, EST NON AFRICAN AMERICAN: 45 mL/min — AB (ref >60)
Glucose, Bld: 101 mg/dL — ABNORMAL HIGH (ref 65–99)
Potassium: 3.6 mmol/L (ref 3.5–5.1)
Sodium: 137 mmol/L (ref 135–145)

## 2017-05-19 LAB — VITAMIN B12: VITAMIN B 12: 302 pg/mL (ref 180–914)

## 2017-05-19 MED ORDER — ENOXAPARIN SODIUM 40 MG/0.4ML ~~LOC~~ SOLN
40.0000 mg | SUBCUTANEOUS | Status: DC
  Administered 2017-05-19 – 2017-05-24 (×5): 40 mg via SUBCUTANEOUS
  Filled 2017-05-19 (×5): qty 0.4

## 2017-05-19 MED ORDER — HYDROCORTISONE ACETATE 25 MG RE SUPP
25.0000 mg | Freq: Two times a day (BID) | RECTAL | Status: AC
  Administered 2017-05-19: 25 mg via RECTAL
  Filled 2017-05-19 (×4): qty 1

## 2017-05-19 NOTE — Progress Notes (Signed)
PROGRESS NOTE        PATIENT DETAILS Name: [name] Age: 66 y.o. Sex: male Date of Birth: 12-19-1950 Admit Date: 04/24/2017 Admitting Physician Briscoe Deutscher, MD PCP:No primary care provider on file.  Brief Narrative: Patient is a 66 y.o. male with history of chronic hypoxemic respiratory failure-tracheotomy in place, COPD, chronic diastolic heart failure, morbid obesity, OSA resented with sepsis secondary to UTI and acute kidney injury. He was also found to be very weak. Hospital course has been complicated by periods of impulsive behavior and confusion, he subsequently evaluated by psychiatry-deemed not to have capacity. Awaiting guardianship/placement. Social worker following. See below for further details.   Subjective: Awake-alert-, calm this morning. Answers all my questions appropriately.  Assessment/Plan: Acute metabolic encephalopathy with underlying dementia: Encephalopathy has resolved, he is thought to have some amount of dementia at baseline. He evaluated by psychiatry, deemed not to have capacity-social worker following for guardianship and placement. CT head negative for acute abnormalities, TSH within normal limits. Will check vitamin B12 and RPR levels to complete workup. Will require outpatient urology evaluation for further treatment/evaluation of dementia. Note recent HIV serology was negative.  Sepsis secondary to UTI: Sepsis pathophysiology has resolved, he has completed a course of antimicrobial therapy (Rocephin followed by ciprofloxacin)  Acute kidney injury on chronic kidney disease stage III: AKI likely hemodynamically mediated in the setting of sepsis- creatinine close to usual baseline. Follow periodically.  One episode of hematochezia: May have underlying hemorrhoids-no recurrence since 9/11-follow for now.  Chronic diastolic heart failure: Volumes status stable-follow closely. Will restart diuretics if he develops volume  overload.  Hypokalemia: Repleted-continue to follow electrolytes periodically.  Morbid obesity with OHS/OSA and chronic hypoxic/ hypercapnic respiratory failure: These issues are stable-continue trach care per protocol.  Type 2 diabetes mellitus: CBGs stable with SSI-A1c 7.3. Follow  Hypertension: Improved with hydralazine. Follow  Moisture associated intertriginous dermatitis: Continue with recommendations for wound care.  History of DVT: Poor historian-not sure if he still was taking anticoagulation prior to this hospitalization-have asked pharmacy to clarify. Not sure if he has a IVC filter-will check a KUB  Morbid obesity: Counseled regarding weight loss.  Deemed not to have capacity for medical decision making and living arrangements: Child psychotherapist following for guardianship/placement.  DVT Prophylaxis: Prophylactic Lovenox   Code Status: Full code   Family Communication: None at bedside  Disposition Plan: Remain inpatient-SNF-once cleared by social work.  Antimicrobial agents: Anti-infectives    Start     Dose/Rate Route Frequency Ordered Stop   05/01/17 0930  ciprofloxacin (CIPRO) tablet 500 mg  Status:  Discontinued     500 mg Oral 2 times daily 05/01/17 0924 05/09/17 1050   04/27/17 0900  fluconazole (DIFLUCAN) IVPB 100 mg  Status:  Discontinued     100 mg 50 mL/hr over 60 Minutes Intravenous Every 24 hours 04/27/17 0752 04/27/17 0754   04/27/17 0900  fluconazole (DIFLUCAN) IVPB 100 mg     100 mg 50 mL/hr over 60 Minutes Intravenous Every 24 hours 04/27/17 0754 04/28/17 1003   04/25/17 0315  cefTRIAXone (ROCEPHIN) 1 g in dextrose 5 % 50 mL IVPB  Status:  Discontinued     1 g 100 mL/hr over 30 Minutes Intravenous Daily at bedtime 04/25/17 0310 05/01/17 0924      Procedures: None  CONSULTS:  psychiatry  Time spent: 25-minutes-Greater than  50% of this time was spent in counseling, explanation of diagnosis, planning of further management, and  coordination of care.  MEDICATIONS: Scheduled Meds: . benzonatate  100 mg Oral TID  . chlorhexidine  15 mL Mouth Rinse BID  . docusate sodium  100 mg Oral BID  . fluticasone  2 spray Each Nare Daily  . guaiFENesin-dextromethorphan  5 mL Oral TID  . hydrALAZINE  37.5 mg Oral BID  . hydrocerin   Topical BID  . insulin aspart  0-5 Units Subcutaneous QHS  . insulin aspart  0-9 Units Subcutaneous TID WC  . liver oil-zinc oxide   Topical TID  . mouth rinse  15 mL Mouth Rinse q12n4p  . sodium chloride flush  3 mL Intravenous Q12H   Continuous Infusions: PRN Meds:.acetaminophen **OR** acetaminophen, bisacodyl, diphenhydrAMINE, hydrALAZINE, HYDROcodone-acetaminophen, ipratropium-albuterol, menthol-cetylpyridinium, ondansetron **OR** ondansetron (ZOFRAN) IV, senna-docusate   PHYSICAL EXAM: Vital signs: Vitals:   05/18/17 2058 05/19/17 0009 05/19/17 0528 05/19/17 0920  BP: 107/85  112/71   Pulse: 82  74   Resp: Temp: 98.3 F (36.8 C)  98.4 F (36.9 C)   TempSrc: Oral  Oral   SpO2: 98%  97% 95%  Weight:   127.3 kg (280 lb 9.6 oz)   Height:       Filed Weights   05/17/17 0409 05/18/17 0613 05/19/17 0528  Weight: 127.5 kg (281 lb 1.4 oz) 128.1 kg (282 lb 6.6 oz) 127.3 kg (280 lb 9.6 oz)   Body mass index is 38.06 kg/m.   General appearance :Awake, alert, not in any distress. Speech Clear. Eyes:, pupils equally reactive to light and accomodation,no scleral icterus.Pink conjunctiva HEENT: Atraumatic and Normocephalic Neck: supple, no JVD. No cervical lymphadenopathy. No thyromegaly Resp:Good air entry bilaterally, no added sounds  CVS: S1 S2 regular, no murmurs.  GI: Bowel sounds present, Non tender and not distended with no gaurding, rigidity or rebound.No organomegaly Extremities: B/L Lower Ext shows no edema, both legs are warm to touch Neurology:  speech clear,Non focal, sensation is grossly intact. Psychiatric: Normal judgment and insight. Alert and oriented x 3.  Normal mood. Musculoskeletal:No digital cyanosis Skin:No Rash, warm and dry  I have personally reviewed following labs and imaging studies  LABORATORY DATA: CBC:  Recent Labs Lab 05/18/17 2342  WBC 7.8  HGB 11.1*  HCT 33.9*  MCV 85.2  PLT 329    Basic Metabolic Panel:  Recent Labs Lab 05/15/17 0459 05/19/17 0452  NA 137 137  K 3.2* 3.6  CL 104 106  CO2 24 23  GLUCOSE 101* 101*  BUN 14 19  CREATININE 1.51* 1.58*  CALCIUM 9.1 8.9    GFR: Estimated Creatinine Clearance: 65.1 mL/min (A) (by C-G formula based on SCr of 1.58 mg/dL (H)).  Liver Function Tests: No results for input(s): AST, ALT, ALKPHOS, BILITOT, PROT, ALBUMIN in the last 168 hours. No results for input(s): LIPASE, AMYLASE in the last 168 hours. No results for input(s): AMMONIA in the last 168 hours.  Coagulation Profile: No results for input(s): INR, PROTIME in the last 168 hours.  Cardiac Enzymes: No results for input(s): CKTOTAL, CKMB, CKMBINDEX, TROPONINI in the last 168 hours.  BNP (last 3 results) No results for input(s): PROBNP in the last 8760 hours.  HbA1C: No results for input(s): HGBA1C in the last 72 hours.  CBG:  Recent Labs Lab 05/18/17 0740 05/18/17 1257 05/18/17 1704 05/18/17 2058 05/19/17 0734  GLUCAP 109* 110* 107* 109* 99    Lipid  Profile: No results for input(s): CHOL, HDL, LDLCALC, TRIG, CHOLHDL, LDLDIRECT in the last 72 hours.  Thyroid Function Tests: No results for input(s): TSH, T4TOTAL, FREET4, T3FREE, THYROIDAB in the last 72 hours.  Anemia Panel: No results for input(s): VITAMINB12, FOLATE, FERRITIN, TIBC, IRON, RETICCTPCT in the last 72 hours.  Urine analysis:    Component Value Date/Time   COLORURINE YELLOW 04/24/2017 2001   APPEARANCEUR CLOUDY (A) 04/24/2017 2001   LABSPEC 1.009 04/24/2017 2001   PHURINE 8.0 04/24/2017 2001   GLUCOSEU NEGATIVE 04/24/2017 2001   HGBUR LARGE (A) 04/24/2017 2001   BILIRUBINUR NEGATIVE 04/24/2017 2001   KETONESUR  NEGATIVE 04/24/2017 2001   PROTEINUR 100 (A) 04/24/2017 2001   UROBILINOGEN 1.0 06/17/2015 1407   NITRITE POSITIVE (A) 04/24/2017 2001   LEUKOCYTESUR LARGE (A) 04/24/2017 2001    Sepsis Labs: Lactic Acid, Venous    Component Value Date/Time   LATICACIDVEN 2.6 (HH) 04/25/2017 0015    MICROBIOLOGY: No results found for this or any previous visit (from the past 240 hour(s)).  RADIOLOGY STUDIES/RESULTS: Dg Chest 2 View  Result Date: 04/24/2017 CLINICAL DATA:  Increasing weakness EXAM: CHEST  2 VIEW COMPARISON:  03/14/2016 FINDINGS: Tracheostomy tube is again identified. Cardiac shadow is stable. The lungs are well aerated bilaterally. No focal infiltrate or sizable effusion is seen. Degenerative changes of the thoracic spine are noted. IMPRESSION: No acute abnormality seen. Electronically Signed   By: Alcide Clever M.D.   On: 04/24/2017 20:57   Dg Lumbar Spine Complete  Result Date: 04/24/2017 CLINICAL DATA:  Increasing weakness with inability to stand EXAM: LUMBAR SPINE - COMPLETE 4+ VIEW COMPARISON:  06/25/2015 FINDINGS: Five lumbar type vertebral bodies are well visualized. Vertebral body height is well maintained. Multilevel osteophytic changes are seen. Disc space narrowing is noted at multiple levels worst at L2-3 and L3-4. Aortic calcifications are noted. An IVC filter is noted in satisfactory position. No overlying soft tissue abnormality is seen. IMPRESSION: Multilevel degenerative change without acute abnormality. Electronically Signed   By: Alcide Clever M.D.   On: 04/24/2017 20:58   Ct Head Wo Contrast  Result Date: 04/24/2017 CLINICAL DATA:  Increased weakness unable to stand EXAM: CT HEAD WITHOUT CONTRAST TECHNIQUE: Contiguous axial images were obtained from the base of the skull through the vertex without intravenous contrast. COMPARISON:  01/28/2015 FINDINGS: Brain: No acute territorial infarction, hemorrhage, or intracranial mass is seen. Mild small vessel ischemic changes of  the white matter. Stable ventricle size. Mild atrophy. Vascular: No hyperdense vessels.  Carotid artery calcifications. Skull: No fracture or suspicious bone lesion. Sinuses/Orbits: No acute finding. Other: None IMPRESSION: No CT evidence for acute intracranial abnormality. Small vessel ischemic changes of the white matter and mild atrophy Electronically Signed   By: Jasmine Pang M.D.   On: 04/24/2017 21:02   US Renal  Result Date: 04/25/2017 CLINICAL DATA:  Acute renal insufficiency. EXAM: RENAL / URINARY TRACT ULTRASOUND COMPLETE COMPARISON:  CT scan June 24, 2015 FINDINGS: Right Kidney: Length: 12.9 cm. Echogenicity within normal limits. No mass or hydronephrosis visualized. Left Kidney: Length: 12.5 cm. There is a 6.3 x 4.2 x 5.7 cm hypoechoic structure along the medial aspect of the left renal midpole. A prominent extrarenal pelvis was seen in this region on previous CT imaging. Bladder: The bladder was empty and not well evaluated. IMPRESSION: 1. No cause for acute renal failure identified. 2. The 6.3 x 4.2 x 5.7 cm hypoechoic structure along the medial aspect of the left renal midpole probably correlates  with a prominent extrarenal pelvis seen on previous CT imaging. Electronically Signed   By: Gerome Samavid  Williams III M.D   On: 04/25/2017 08:26   Dg Chest Port 1 View  Result Date: 04/27/2017 CLINICAL DATA:  Re- insertion of tracheostomy tube. EXAM: PORTABLE CHEST 1 VIEW COMPARISON:  04/24/2017 FINDINGS: The tip of a tracheostomy tube is seen centered over the superior mediastinum and upper trachea with tip terminating at the level of the aortic arch. Heart is enlarged with minimal aortic atherosclerosis. Mild vascular congestion is noted. No effusion or pneumothorax. No pulmonary consolidation. No acute nor suspicious osseous abnormality. IMPRESSION: 1. Cardiomegaly with mild pulmonary vascular congestion. 2. Minimal aortic atherosclerosis. 3. Tip of a tracheostomy tube is centered within the upper  trachea with tip extending to the level of the aortic arch. Electronically Signed   By: Tollie Ethavid  Kwon M.D.   On: 04/27/2017 00:21     LOS: 25 days   Jeoffrey MassedShanker Tremar Wickens, MD  [facility] Pager:[phone]  If 7PM-7AM, please contact night-coverage [email or web address] Password [email or web address] 05/19/2017, 11:03 AM

## 2017-05-20 LAB — GLUCOSE, CAPILLARY
GLUCOSE-CAPILLARY: 99 mg/dL (ref 65–99)
Glucose-Capillary: 98 mg/dL (ref 65–99)
Glucose-Capillary: 118 mg/dL — ABNORMAL HIGH (ref 65–99)
Glucose-Capillary: 110 mg/dL — ABNORMAL HIGH (ref 65–99)

## 2017-05-20 LAB — RPR: RPR: NONREACTIVE

## 2017-05-20 NOTE — Progress Notes (Signed)
PT Cancellation Note  Patient Details Name: [name] MRN: [id number] DOB: July 31, 1951   Cancelled Treatment:    Reason Eval/Treat Not Completed: Fatigue/lethargy limiting ability to participate--will check back another day.    Rebeca AlertJannie Izan Miron, MPT Pager: [phone]

## 2017-05-20 NOTE — Progress Notes (Signed)
Pt followed by CSW for discharge to SNF.  

## 2017-05-20 NOTE — Progress Notes (Signed)
PROGRESS NOTE        PATIENT DETAILS Name: [name] Age: 66 y.o. Sex: male Date of Birth: 09-02-1951 Admit Date: 04/24/2017 Admitting Physician Briscoe Deutscher, MD PCP:No primary care provider on file.  Brief Narrative: Patient is a 66 y.o. male with history of chronic hypoxemic respiratory failure-tracheotomy in place, COPD, chronic diastolic heart failure, morbid obesity, OSA resented with sepsis secondary to UTI and acute kidney injury. He was also found to be very weak. Hospital course has been complicated by periods of impulsive behavior and confusion, he subsequently evaluated by psychiatry-deemed not to have capacity. Awaiting guardianship/placement. Social worker following. See below for further details.   Subjective: Awake-alert-sleeping comfortably. No chest pain or SOB.  Assessment/Plan: Acute metabolic encephalopathy with underlying dementia: , He got to have some amount of dementia at baseline. Per psychiatry, he does not have capacity, and placement. CT head negative, TSH normal. Vitamin B12 borderline low-checking methylmalonic acid and homocystine levels. RPR negative, recent HIV serology was negative. Will require outpatient neurology evaluation.   Sepsis secondary to UTI: Sepsis pathophysiology has resolved, he has completed a course of antimicrobial therapy (Rocephin followed by ciprofloxacin)  Acute kidney injury on chronic kidney disease stage III: AKI likely hemodynamically mediated in the setting of sepsis- creatinine close to usual baseline. Follow periodically.  One episode of hematochezia: Occurred on 9/11-suspect from underlying hemorrhoids-no recurrence since then-follow for now.   Chronic diastolic heart failure: Volume status continues to be stable-follow closely and restart diuretics if needed.   Hypokalemia: Repleted, follow electrolytes periodically.  Morbid obesity with OHS/OSA and chronic hypoxic/ hypercapnic  respiratory failure: Stable-continue trach care per protocol.   Type 2 diabetes mellitus: CBGs stable with SSI-  Hypertension: Controlled, continue with hydralazine. Follow BP trend.   Moisture associated intertriginous dermatitis: Continue with recommendations for wound care.  History of DVT: Poor historian-after speaking with pharmacy and it appears that anticoagulation was discontinued this past February, furthermore a KUB done on 9/12 confirms a IVC filter in place. Currently on prophylactic Lovenox.  Morbid obesity: Counseled regarding weight loss  Deemed not to have capacity for medical decision making and living arrangements: Child psychotherapist following. Guardianship/placement.   DVT Prophylaxis: Prophylactic Lovenox   Code Status: Full code   Family Communication: None at bedside  Disposition Plan: Remain inpatient-SNF-once cleared by social work.  Antimicrobial agents: Anti-infectives    Start     Dose/Rate Route Frequency Ordered Stop   05/01/17 0930  ciprofloxacin (CIPRO) tablet 500 mg  Status:  Discontinued     500 mg Oral 2 times daily 05/01/17 0924 05/09/17 1050   04/27/17 0900  fluconazole (DIFLUCAN) IVPB 100 mg  Status:  Discontinued     100 mg 50 mL/hr over 60 Minutes Intravenous Every 24 hours 04/27/17 0752 04/27/17 0754   04/27/17 0900  fluconazole (DIFLUCAN) IVPB 100 mg     100 mg 50 mL/hr over 60 Minutes Intravenous Every 24 hours 04/27/17 0754 04/28/17 1003   04/25/17 0315  cefTRIAXone (ROCEPHIN) 1 g in dextrose 5 % 50 mL IVPB  Status:  Discontinued     1 g 100 mL/hr over 30 Minutes Intravenous Daily at bedtime 04/25/17 0310 05/01/17 0924      Procedures: None  CONSULTS:  psychiatry  Time spent: 25-minutes-Greater than 50% of this time was spent in counseling, explanation of diagnosis, planning of further  management, and coordination of care.  MEDICATIONS: Scheduled Meds: . benzonatate  100 mg Oral TID  . chlorhexidine  15 mL Mouth Rinse BID    . docusate sodium  100 mg Oral BID  . enoxaparin (LOVENOX) injection  40 mg Subcutaneous Q24H  . fluticasone  2 spray Each Nare Daily  . guaiFENesin-dextromethorphan  5 mL Oral TID  . hydrALAZINE  37.5 mg Oral BID  . hydrocerin   Topical BID  . hydrocortisone  25 mg Rectal BID  . insulin aspart  0-5 Units Subcutaneous QHS  . insulin aspart  0-9 Units Subcutaneous TID WC  . liver oil-zinc oxide   Topical TID  . mouth rinse  15 mL Mouth Rinse q12n4p  . sodium chloride flush  3 mL Intravenous Q12H   Continuous Infusions: PRN Meds:.acetaminophen **OR** acetaminophen, bisacodyl, diphenhydrAMINE, hydrALAZINE, HYDROcodone-acetaminophen, ipratropium-albuterol, menthol-cetylpyridinium, ondansetron **OR** ondansetron (ZOFRAN) IV, senna-docusate   PHYSICAL EXAM: Vital signs: Vitals:   05/20/17 0541 05/20/17 0645 05/20/17 0803 05/20/17 1210  BP: (!) 106/50     Pulse: 90  89   Resp: 20  18   Temp: 98.5 F (36.9 C)     TempSrc: Oral     SpO2: 100%  99% 99%  Weight:  127.1 kg (280 lb 3.2 oz)    Height:       Filed Weights   05/18/17 0613 05/19/17 0528 05/20/17 0645  Weight: 128.1 kg (282 lb 6.6 oz) 127.3 kg (280 lb 9.6 oz) 127.1 kg (280 lb 3.2 oz)   Body mass index is 38 kg/m.   General appearance :Awake, alert, not in any distress.  Eyes:, pupils equally reactive to light and accomodation,no scleral icterus. HEENT: Atraumatic and Normocephalic Neck: supple, no JVD.Tracheostomy in place Resp:Good air entry bilaterally,No rales or rhonchi CVS: S1 S2 regular, no murmurs.  GI: Bowel sounds present, Non tender and not distended with no gaurding, rigidity or rebound. Extremities: B/L Lower Ext shows trace edema, both legs are warm to touch. Has chronic venous stasis changes to bilateral lower extremities Neurology:  speech clear,Non focal, sensation is grossly intact. Psychiatric: Normal judgment and insight. Normal mood. Musculoskeletal:No digital cyanosis Skin:No Rash, warm and  dry Wounds:N/A I have personally reviewed following labs and imaging studies  LABORATORY DATA: CBC:  Recent Labs Lab 05/18/17 2342  WBC 7.8  HGB 11.1*  HCT 33.9*  MCV 85.2  PLT 329    Basic Metabolic Panel:  Recent Labs Lab 05/15/17 0459 05/19/17 0452  NA 137 137  K 3.2* 3.6  CL 104 106  CO2 24 23  GLUCOSE 101* 101*  BUN 14 19  CREATININE 1.51* 1.58*  CALCIUM 9.1 8.9    GFR: Estimated Creatinine Clearance: 65.1 mL/min (A) (by C-G formula based on SCr of 1.58 mg/dL (H)).  Liver Function Tests: No results for input(s): AST, ALT, ALKPHOS, BILITOT, PROT, ALBUMIN in the last 168 hours. No results for input(s): LIPASE, AMYLASE in the last 168 hours. No results for input(s): AMMONIA in the last 168 hours.  Coagulation Profile: No results for input(s): INR, PROTIME in the last 168 hours.  Cardiac Enzymes: No results for input(s): CKTOTAL, CKMB, CKMBINDEX, TROPONINI in the last 168 hours.  BNP (last 3 results) No results for input(s): PROBNP in the last 8760 hours.  HbA1C: No results for input(s): HGBA1C in the last 72 hours.  CBG:  Recent Labs Lab 05/19/17 0734 05/19/17 1201 05/19/17 1639 05/19/17 2223 05/20/17 0922  GLUCAP 99 141* 109* 120* 98    Lipid  Profile: No results for input(s): CHOL, HDL, LDLCALC, TRIG, CHOLHDL, LDLDIRECT in the last 72 hours.  Thyroid Function Tests: No results for input(s): TSH, T4TOTAL, FREET4, T3FREE, THYROIDAB in the last 72 hours.  Anemia Panel:  Recent Labs  05/19/17 1240  VITAMINB12 302    Urine analysis:    Component Value Date/Time   COLORURINE YELLOW 04/24/2017 2001   APPEARANCEUR CLOUDY (A) 04/24/2017 2001   LABSPEC 1.009 04/24/2017 2001   PHURINE 8.0 04/24/2017 2001   GLUCOSEU NEGATIVE 04/24/2017 2001   HGBUR LARGE (A) 04/24/2017 2001   BILIRUBINUR NEGATIVE 04/24/2017 2001   KETONESUR NEGATIVE 04/24/2017 2001   PROTEINUR 100 (A) 04/24/2017 2001   UROBILINOGEN 1.0 06/17/2015 1407   NITRITE  POSITIVE (A) 04/24/2017 2001   LEUKOCYTESUR LARGE (A) 04/24/2017 2001    Sepsis Labs: Lactic Acid, Venous    Component Value Date/Time   LATICACIDVEN 2.6 (HH) 04/25/2017 0015    MICROBIOLOGY: No results found for this or any previous visit (from the past 240 hour(s)).  RADIOLOGY STUDIES/RESULTS: Dg Chest 2 View  Result Date: 04/24/2017 CLINICAL DATA:  Increasing weakness EXAM: CHEST  2 VIEW COMPARISON:  03/14/2016 FINDINGS: Tracheostomy tube is again identified. Cardiac shadow is stable. The lungs are well aerated bilaterally. No focal infiltrate or sizable effusion is seen. Degenerative changes of the thoracic spine are noted. IMPRESSION: No acute abnormality seen. Electronically Signed   By: Alcide Clever M.D.   On: 04/24/2017 20:57   Dg Lumbar Spine Complete  Result Date: 04/24/2017 CLINICAL DATA:  Increasing weakness with inability to stand EXAM: LUMBAR SPINE - COMPLETE 4+ VIEW COMPARISON:  06/25/2015 FINDINGS: Five lumbar type vertebral bodies are well visualized. Vertebral body height is well maintained. Multilevel osteophytic changes are seen. Disc space narrowing is noted at multiple levels worst at L2-3 and L3-4. Aortic calcifications are noted. An IVC filter is noted in satisfactory position. No overlying soft tissue abnormality is seen. IMPRESSION: Multilevel degenerative change without acute abnormality. Electronically Signed   By: Alcide Clever M.D.   On: 04/24/2017 20:58   Dg Abd 1 View  Result Date: 05/19/2017 CLINICAL DATA:  IVC filter assessment. EXAM: ABDOMEN - 1 VIEW COMPARISON:  06/25/2015 abdomen radiographs, CT from 06/24/2015 FINDINGS: Similarly positioned IVC filter is noted to the right of the lumbar spine with apex at the inferior endplate level of L1 and prongs deployed at the mid L3 level. Bowel gas pattern is unremarkable. There is lumbar spondylosis. Numerous calcifications are noted of the left kidney, similar in appearance. IMPRESSION: 1. IVC filter is noted to  the right of the lumbar spine and is without significant change. 2. Left-sided nephrolithiasis. 3. Unremarkable bowel gas pattern. Electronically Signed   By: Tollie Eth M.D.   On: 05/19/2017 15:00   Ct Head Wo Contrast  Result Date: 04/24/2017 CLINICAL DATA:  Increased weakness unable to stand EXAM: CT HEAD WITHOUT CONTRAST TECHNIQUE: Contiguous axial images were obtained from the base of the skull through the vertex without intravenous contrast. COMPARISON:  01/28/2015 FINDINGS: Brain: No acute territorial infarction, hemorrhage, or intracranial mass is seen. Mild small vessel ischemic changes of the white matter. Stable ventricle size. Mild atrophy. Vascular: No hyperdense vessels.  Carotid artery calcifications. Skull: No fracture or suspicious bone lesion. Sinuses/Orbits: No acute finding. Other: None IMPRESSION: No CT evidence for acute intracranial abnormality. Small vessel ischemic changes of the white matter and mild atrophy Electronically Signed   By: Jasmine Pang M.D.   On: 04/24/2017 21:02   US Renal  Result Date: 04/25/2017 CLINICAL DATA:  Acute renal insufficiency. EXAM: RENAL / URINARY TRACT ULTRASOUND COMPLETE COMPARISON:  CT scan June 24, 2015 FINDINGS: Right Kidney: Length: 12.9 cm. Echogenicity within normal limits. No mass or hydronephrosis visualized. Left Kidney: Length: 12.5 cm. There is a 6.3 x 4.2 x 5.7 cm hypoechoic structure along the medial aspect of the left renal midpole. A prominent extrarenal pelvis was seen in this region on previous CT imaging. Bladder: The bladder was empty and not well evaluated. IMPRESSION: 1. No cause for acute renal failure identified. 2. The 6.3 x 4.2 x 5.7 cm hypoechoic structure along the medial aspect of the left renal midpole probably correlates with a prominent extrarenal pelvis seen on previous CT imaging. Electronically Signed   By: Gerome Sam III M.D   On: 04/25/2017 08:26   Dg Chest Port 1 View  Result Date: 04/27/2017 CLINICAL  DATA:  Re- insertion of tracheostomy tube. EXAM: PORTABLE CHEST 1 VIEW COMPARISON:  04/24/2017 FINDINGS: The tip of a tracheostomy tube is seen centered over the superior mediastinum and upper trachea with tip terminating at the level of the aortic arch. Heart is enlarged with minimal aortic atherosclerosis. Mild vascular congestion is noted. No effusion or pneumothorax. No pulmonary consolidation. No acute nor suspicious osseous abnormality. IMPRESSION: 1. Cardiomegaly with mild pulmonary vascular congestion. 2. Minimal aortic atherosclerosis. 3. Tip of a tracheostomy tube is centered within the upper trachea with tip extending to the level of the aortic arch. Electronically Signed   By: Tollie Eth M.D.   On: 04/27/2017 00:21     LOS: 26 days   Jeoffrey Massed, MD  [facility] Pager:[phone]  If 7PM-7AM, please contact night-coverage [email or web address] Password [email or web address] 05/20/2017, 1:05 PM

## 2017-05-21 DIAGNOSIS — G9340 Encephalopathy, unspecified: Secondary | ICD-10-CM

## 2017-05-21 LAB — GLUCOSE, CAPILLARY
GLUCOSE-CAPILLARY: 111 mg/dL — AB (ref 65–99)
Glucose-Capillary: 156 mg/dL — ABNORMAL HIGH (ref 65–99)
Glucose-Capillary: 120 mg/dL — ABNORMAL HIGH (ref 65–99)
Glucose-Capillary: 107 mg/dL — ABNORMAL HIGH (ref 65–99)

## 2017-05-21 LAB — HOMOCYSTEINE: Homocysteine: 38.6 umol/L — ABNORMAL HIGH (ref 0.0–15.0)

## 2017-05-21 MED ORDER — IPRATROPIUM-ALBUTEROL 0.5-2.5 (3) MG/3ML IN SOLN
3.0000 mL | RESPIRATORY_TRACT | Status: DC | PRN
  Administered 2017-05-21: 3 mL via RESPIRATORY_TRACT
  Filled 2017-05-21: qty 3

## 2017-05-21 NOTE — Progress Notes (Signed)
CSW contacted patient's guardian ad litem Gwen Pounds [phone]) to inquire about patient's guardianship hearing that was scheduled for 05/20/2017 at 1pm. No answer, CSW left voicemail requesting return phone call.   CSW contacted patient's APS worker Crist Infante Mayford Knife [phone]) to inquire about patient's guardianship hearing, no answer. CSW left voicemail requesting return phone call.   CSW awaiting return call from patient's guardian ad litem and APS worker regarding patient's guardianship hearing.  Celso Sickle, [place] Clinical Social Worker [facility] Cell#: [phone]

## 2017-05-21 NOTE — Progress Notes (Signed)
  PROGRESS NOTE  [name] WUJ:[id number] DOB: Apr 24, 1951 DOA: 04/24/2017 PCP: No primary care provider on file.  Brief Narrative: 66 year old man PMH chronic hypoxic respiratory failure, tracheostomy, COPD, chronic diastolic heart failure, morbid obesity, presented with sepsis secondary to UTI, acute kidney injury. Hospital course complicated by periods of impulsive behavior and confusion, subsequently evaluated by psychiatry and deemed not to have capacity. Awaiting guardianship and placement. Social worker following.  Assessment/Plan Acute metabolic encephalopathy, underlying dementia. Per psychiatry does not have capacity. Outpatient neurology evaluation recommended. -Social worker continues to follow for placement purposes.  Sepsis secondary to UTI, resolved. He has completed a course of antibiotics.  Acute kidney injury superimposed on chronic kidney disease stage III, appears stable.  Chronic diastolic congestive heart failure, appears stable. Restart diuretics if needed.  Morbid obesity with obesity hypoventilation syndrome, obstructive sleep apnea, chronic hypoxic/hypercapnic respiratory failure. Continue tracheostomy care.  Diabetes mellitus type 2, stable. -Continue sliding scale insulin  Morbid obesity.    DVT prophylaxis: enoxaparin Code Status: full Family Communication: none Disposition Plan: SNF    Brendia Sacks, MD  [facility] Direct contact: [phone] --Via amion app OR  --[email or web address]; password [email or web address]  7PM-7AM contact night coverage as above 05/21/2017, 11:31 AM  LOS: 27 days   Consultants:  Psychiatry  Procedures:    Antimicrobials:    Interval history/Subjective: Patient complains of itching.  Objective: Vitals: Afebrile, 98.8, 18, 88, 126/80, 99% on room air  Exam:     Constitutional: Appears calm, comfortable.  Respiratory. Clear to auscultation bilaterally. No wheezes, rales or rhonchi. Normal respiratory  effort.  Cardiovascular. Regular rate and rhythm. No murmur, rub or gallop.   I have personally reviewed the following:   Labs:  Blood sugars stable.  Scheduled Meds: . benzonatate  100 mg Oral TID  . chlorhexidine  15 mL Mouth Rinse BID  . docusate sodium  100 mg Oral BID  . enoxaparin (LOVENOX) injection  40 mg Subcutaneous Q24H  . fluticasone  2 spray Each Nare Daily  . guaiFENesin-dextromethorphan  5 mL Oral TID  . hydrALAZINE  37.5 mg Oral BID  . hydrocerin   Topical BID  . insulin aspart  0-5 Units Subcutaneous QHS  . insulin aspart  0-9 Units Subcutaneous TID WC  . liver oil-zinc oxide   Topical TID  . mouth rinse  15 mL Mouth Rinse q12n4p  . sodium chloride flush  3 mL Intravenous Q12H   Continuous Infusions:  Principal Problem:   AKI (acute kidney injury) (HCC) Active Problems:   BMI 50.0-59.9, adult (HCC)   Chronic deep vein thrombosis (DVT) (HCC)   Obesity hypoventilation syndrome (HCC)   Chronic respiratory failure with hypoxia (HCC)   Tracheostomy status (HCC)   Chronic diastolic congestive heart failure (HCC)   Hypokalemia   Depression with anxiety   Chronic renal failure, stage 2 (mild)   DM (diabetes mellitus) type II controlled with renal manifestation (HCC)   OSA (obstructive sleep apnea)   Hyponatremia   Failure to thrive in adult   Obesity, Class III, BMI 40-49.9 (morbid obesity) (HCC)   Pressure injury of skin   LOS: 27 days

## 2017-05-21 NOTE — Progress Notes (Signed)
Nutrition Follow-up  DOCUMENTATION CODES:   Morbid obesity  INTERVENTION:   Magic cup TID with meals, each supplement provides 290 kcal and 9 grams of protein   NUTRITION DIAGNOSIS:   Increased nutrient needs related to wound healing as evidenced by estimated needs.  Ongoing  GOAL:   Patient will meet greater than or equal to 90% of their needs  Meeting  MONITOR:   PO intake, Supplement acceptance, Labs, Skin  REASON FOR ASSESSMENT:   Low Braden    ASSESSMENT:   Pt with PMH of CHF, COPD, HLD, CKD II, and HTN. Complains of increased weakness over the past few months. Reports over the past two weeks he has been immobile laying on a couch incontinent of stool and urine. Presents this admission with AKI superimposed on CKD II.   Spoke with pt at bedside. Pt reports having great appetite. Meal completions charted at 100% average for last eight meals. Continues to await adult guardianship and SNF placement. Wt noted to be stable. Wounds healing accordingly. RD to sign off on this pt. Please re-consult if any issues arise.  Medications reviewed and include: SSI Labs reviewed: CBG 98-156  Diet Order:  Diet heart healthy/carb modified Room service appropriate? Yes; Fluid consistency: Thin  Skin:   (stg III buttocks stg II buttocks)  Last BM:  05/21/17  Height:   Ht Readings from Last 1 Encounters:  05/03/17 6' (1.829 m)    Weight:   Wt Readings from Last 1 Encounters:  05/21/17 280 lb 3.2 oz (127.1 kg)    Ideal Body Weight:  78.2 kg  BMI:  Body mass index is 38 kg/m.  Estimated Nutritional Needs:   Kcal:  2155 (MSJ)  Protein:  110-120 grams (1.4-1.5 g/kg IBW)  Fluid:  >2.1 L/day  EDUCATION NEEDS:   No education needs identified at this time  Vanessa Kick RD, LDN Clinical Nutrition Pager # - [phone]

## 2017-05-22 DIAGNOSIS — E1122 Type 2 diabetes mellitus with diabetic chronic kidney disease: Secondary | ICD-10-CM

## 2017-05-22 LAB — GLUCOSE, CAPILLARY
Glucose-Capillary: 126 mg/dL — ABNORMAL HIGH (ref 65–99)
Glucose-Capillary: 116 mg/dL — ABNORMAL HIGH (ref 65–99)
Glucose-Capillary: 98 mg/dL (ref 65–99)
Glucose-Capillary: 116 mg/dL — ABNORMAL HIGH (ref 65–99)

## 2017-05-22 LAB — METHYLMALONIC ACID, SERUM: Methylmalonic Acid, Quantitative: 607 nmol/L — ABNORMAL HIGH (ref 0–378)

## 2017-05-22 NOTE — Progress Notes (Signed)
PROGRESS NOTE                                                                                                                                                                                                             Patient Demographics:    [name], is a 66 y.o. male, DOB - 02/16/1951, ZOX:[id number]  Admit date - 04/24/2017   Admitting Physician Briscoe Deutscher, MD  Outpatient Primary MD for the patient is No primary care provider on file.  LOS - 28  Outpatient Specialists:     Chief Complaint  Patient presents with  . Weakness       Brief Narrative   66 year old man PMH chronic hypoxic respiratory failure, tracheostomy, COPD, chronic diastolic heart failure, morbid obesity, presented with sepsis secondary to UTI, acute kidney injury. Hospital course complicated by periods of impulsive behavior and confusion, subsequently evaluated by psychiatry and deemed not to have capacity. Awaiting guardianship and placement. Social worker following.   Subjective:    [name] today states that his breathing is good, he denies dysuria, hematuria.    No headache, No chest pain, No abdominal pain - No Nausea, No new weakness tingling or numbness, No Cough - SOB.   Assessment  & Plan :    Principal Problem:   AKI (acute kidney injury) (HCC) Active Problems:   BMI 50.0-59.9, adult (HCC)   Chronic deep vein thrombosis (DVT) (HCC)   Obesity hypoventilation syndrome (HCC)   Chronic respiratory failure with hypoxia (HCC)   Tracheostomy status (HCC)   Chronic diastolic congestive heart failure (HCC)   Hypokalemia   Depression with anxiety   Chronic renal failure, stage 2 (mild)   DM (diabetes mellitus) type II controlled with renal manifestation (HCC)   OSA (obstructive sleep apnea)   Hyponatremia   Failure to thrive in adult   Obesity, Class III, BMI 40-49.9 (morbid obesity) (HCC)   Pressure injury of  skin   Acute metabolic encephalopathy, underlying dementia. Per psychiatry does not have capacity. Outpatient neurology evaluation recommended. -Social worker continues to follow for placement purposes.  Sepsis secondary to UTI, resolved. He has completed a course of antibiotics.  Acute kidney injury superimposed on chronic kidney disease stage III, appears stable. Check cmp in am  Chronic diastolic congestive heart failure, appears stable. Restart diuretics if needed.  Morbid obesity with obesity hypoventilation syndrome, obstructive sleep apnea, chronic hypoxic/hypercapnic respiratory failure. Continue tracheostomy care.  Diabetes mellitus type 2, stable. -Continue sliding scale insulin  Morbid obesity.  Anemia Check cbc in am   Code Status : FULL CODE  Family Communication  : w patient  Disposition Plan  :  SNF, pt unable to walk by self more than a few feet  Barriers For Discharge :   Consults  :   none  Procedures  :   DVT Prophylaxis  :  Lovenox - Heparin - SCDs   Lab Results  Component Value Date   PLT 329 05/18/2017    Antibiotics  :    Anti-infectives    Start     Dose/Rate Route Frequency Ordered Stop   05/01/17 0930  ciprofloxacin (CIPRO) tablet 500 mg  Status:  Discontinued     500 mg Oral 2 times daily 05/01/17 0924 05/09/17 1050   04/27/17 0900  fluconazole (DIFLUCAN) IVPB 100 mg  Status:  Discontinued     100 mg 50 mL/hr over 60 Minutes Intravenous Every 24 hours 04/27/17 0752 04/27/17 0754   04/27/17 0900  fluconazole (DIFLUCAN) IVPB 100 mg     100 mg 50 mL/hr over 60 Minutes Intravenous Every 24 hours 04/27/17 0754 04/28/17 1003   04/25/17 0315  cefTRIAXone (ROCEPHIN) 1 g in dextrose 5 % 50 mL IVPB  Status:  Discontinued     1 g 100 mL/hr over 30 Minutes Intravenous Daily at bedtime 04/25/17 0310 05/01/17 0924        Objective:   Vitals:   05/22/17 0438 05/22/17 0510 05/22/17 0745 05/22/17 1159  BP: 125/74     Pulse: 74 71 78  76  Resp: Temp: 98.2 F (36.8 C)     TempSrc: Oral     SpO2: 100% 98% 94%   Weight: 128.6 kg (283 lb 9.6 oz)     Height:        Wt Readings from Last 3 Encounters:  05/22/17 128.6 kg (283 lb 9.6 oz)  10/15/16 118.4 kg (261 lb)  09/14/16 113.4 kg (250 lb)     Intake/Output Summary (Last 24 hours) at 05/22/17 1258 Last data filed at 05/22/17 0900  Gross per 24 hour  Intake             1440 ml  Output             1600 ml  Net             -160 ml     Physical Exam  Awake Alert, Oriented X 3, No new F.N deficits, Normal affect [place].AT,PERRAL + trach , Supple Neck,No JVD, No cervical lymphadenopathy appriciated.  Symmetrical Chest wall movement, Good air movement bilaterally, CTAB RRR,No Gallops,Rubs or new Murmurs, No Parasternal Heave +ve B.Sounds, Abd Soft, No tenderness, No organomegaly appriciated, No rebound - guarding or rigidity. No Cyanosis, Clubbing or edema, No new Rash or bruise      Data Review:    CBC  Recent Labs Lab 05/18/17 2342  WBC 7.8  HGB 11.1*  HCT 33.9*  PLT 329  MCV 85.2  MCH 27.9  MCHC 32.7  RDW 21.0*    Chemistries   Recent Labs Lab 05/19/17 0452  NA 137  K 3.6  CL  106  CO2 23  GLUCOSE 101*  BUN 19  CREATININE 1.58*  CALCIUM 8.9   ------------------------------------------------------------------------------------------------------------------ No results for input(s): CHOL, HDL, LDLCALC, TRIG, CHOLHDL, LDLDIRECT in the last 72 hours.  Lab Results  Component Value Date   HGBA1C 7.3 (H) 04/26/2017   ------------------------------------------------------------------------------------------------------------------ No results for input(s): TSH, T4TOTAL, T3FREE, THYROIDAB in the last 72 hours.  Invalid input(s): FREET3 ------------------------------------------------------------------------------------------------------------------ No results for input(s): VITAMINB12, FOLATE, FERRITIN, TIBC, IRON, RETICCTPCT in  the last 72 hours.  Coagulation profile No results for input(s): INR, PROTIME in the last 168 hours.  No results for input(s): DDIMER in the last 72 hours.  Cardiac Enzymes No results for input(s): CKMB, TROPONINI, MYOGLOBIN in the last 168 hours.  Invalid input(s): CK ------------------------------------------------------------------------------------------------------------------    Component Value Date/Time   BNP 48.6 04/24/2017 2059    Inpatient Medications  Scheduled Meds: . benzonatate  100 mg Oral TID  . chlorhexidine  15 mL Mouth Rinse BID  . docusate sodium  100 mg Oral BID  . enoxaparin (LOVENOX) injection  40 mg Subcutaneous Q24H  . fluticasone  2 spray Each Nare Daily  . guaiFENesin-dextromethorphan  5 mL Oral TID  . hydrALAZINE  37.5 mg Oral BID  . hydrocerin   Topical BID  . insulin aspart  0-5 Units Subcutaneous QHS  . insulin aspart  0-9 Units Subcutaneous TID WC  . liver oil-zinc oxide   Topical TID  . mouth rinse  15 mL Mouth Rinse q12n4p  . sodium chloride flush  3 mL Intravenous Q12H   Continuous Infusions: PRN Meds:.acetaminophen **OR** acetaminophen, bisacodyl, diphenhydrAMINE, HYDROcodone-acetaminophen, ipratropium-albuterol, menthol-cetylpyridinium, ondansetron **OR** ondansetron (ZOFRAN) IV, senna-docusate  Micro Results No results found for this or any previous visit (from the past 240 hour(s)).  Radiology Reports Dg Chest 2 View  Result Date: 04/24/2017 CLINICAL DATA:  Increasing weakness EXAM: CHEST  2 VIEW COMPARISON:  03/14/2016 FINDINGS: Tracheostomy tube is again identified. Cardiac shadow is stable. The lungs are well aerated bilaterally. No focal infiltrate or sizable effusion is seen. Degenerative changes of the thoracic spine are noted. IMPRESSION: No acute abnormality seen. Electronically Signed   By: Alcide Clever M.D.   On: 04/24/2017 20:57   Dg Lumbar Spine Complete  Result Date: 04/24/2017 CLINICAL DATA:  Increasing weakness with  inability to stand EXAM: LUMBAR SPINE - COMPLETE 4+ VIEW COMPARISON:  06/25/2015 FINDINGS: Five lumbar type vertebral bodies are well visualized. Vertebral body height is well maintained. Multilevel osteophytic changes are seen. Disc space narrowing is noted at multiple levels worst at L2-3 and L3-4. Aortic calcifications are noted. An IVC filter is noted in satisfactory position. No overlying soft tissue abnormality is seen. IMPRESSION: Multilevel degenerative change without acute abnormality. Electronically Signed   By: Alcide Clever M.D.   On: 04/24/2017 20:58   Dg Abd 1 View  Result Date: 05/19/2017 CLINICAL DATA:  IVC filter assessment. EXAM: ABDOMEN - 1 VIEW COMPARISON:  06/25/2015 abdomen radiographs, CT from 06/24/2015 FINDINGS: Similarly positioned IVC filter is noted to the right of the lumbar spine with apex at the inferior endplate level of L1 and prongs deployed at the mid L3 level. Bowel gas pattern is unremarkable. There is lumbar spondylosis. Numerous calcifications are noted of the left kidney, similar in appearance. IMPRESSION: 1. IVC filter is noted to the right of the lumbar spine and is without significant change. 2. Left-sided nephrolithiasis. 3. Unremarkable bowel gas pattern. Electronically Signed   By: Tollie Eth M.D.   On: 05/19/2017 15:00  Ct Head Wo Contrast  Result Date: 04/24/2017 CLINICAL DATA:  Increased weakness unable to stand EXAM: CT HEAD WITHOUT CONTRAST TECHNIQUE: Contiguous axial images were obtained from the base of the skull through the vertex without intravenous contrast. COMPARISON:  01/28/2015 FINDINGS: Brain: No acute territorial infarction, hemorrhage, or intracranial mass is seen. Mild small vessel ischemic changes of the white matter. Stable ventricle size. Mild atrophy. Vascular: No hyperdense vessels.  Carotid artery calcifications. Skull: No fracture or suspicious bone lesion. Sinuses/Orbits: No acute finding. Other: None IMPRESSION: No CT evidence for  acute intracranial abnormality. Small vessel ischemic changes of the white matter and mild atrophy Electronically Signed   By: Jasmine Pang M.D.   On: 04/24/2017 21:02   US Renal  Result Date: 04/25/2017 CLINICAL DATA:  Acute renal insufficiency. EXAM: RENAL / URINARY TRACT ULTRASOUND COMPLETE COMPARISON:  CT scan June 24, 2015 FINDINGS: Right Kidney: Length: 12.9 cm. Echogenicity within normal limits. No mass or hydronephrosis visualized. Left Kidney: Length: 12.5 cm. There is a 6.3 x 4.2 x 5.7 cm hypoechoic structure along the medial aspect of the left renal midpole. A prominent extrarenal pelvis was seen in this region on previous CT imaging. Bladder: The bladder was empty and not well evaluated. IMPRESSION: 1. No cause for acute renal failure identified. 2. The 6.3 x 4.2 x 5.7 cm hypoechoic structure along the medial aspect of the left renal midpole probably correlates with a prominent extrarenal pelvis seen on previous CT imaging. Electronically Signed   By: Gerome Sam III M.D   On: 04/25/2017 08:26   Dg Chest Port 1 View  Result Date: 04/27/2017 CLINICAL DATA:  Re- insertion of tracheostomy tube. EXAM: PORTABLE CHEST 1 VIEW COMPARISON:  04/24/2017 FINDINGS: The tip of a tracheostomy tube is seen centered over the superior mediastinum and upper trachea with tip terminating at the level of the aortic arch. Heart is enlarged with minimal aortic atherosclerosis. Mild vascular congestion is noted. No effusion or pneumothorax. No pulmonary consolidation. No acute nor suspicious osseous abnormality. IMPRESSION: 1. Cardiomegaly with mild pulmonary vascular congestion. 2. Minimal aortic atherosclerosis. 3. Tip of a tracheostomy tube is centered within the upper trachea with tip extending to the level of the aortic arch. Electronically Signed   By: Tollie Eth M.D.   On: 04/27/2017 00:21    Time Spent in minutes  30   Pearson Grippe M.D on 05/22/2017 at 12:58 PM  Between 7am to 7pm - Pager -  [phone]  After 7pm go to [email or web address] - password [facility]  [facility] -  Office  [phone]

## 2017-05-23 LAB — CBC
HCT: 34.1 % — ABNORMAL LOW (ref 39.0–52.0)
HEMOGLOBIN: 10.9 g/dL — AB (ref 13.0–17.0)
MCH: 27.4 pg (ref 26.0–34.0)
MCHC: 32 g/dL (ref 30.0–36.0)
MCV: 85.7 fL (ref 78.0–100.0)
PLATELETS: 333 10*3/uL (ref 150–400)
RBC: 3.98 MIL/uL — AB (ref 4.22–5.81)
RDW: 21.3 % — ABNORMAL HIGH (ref 11.5–15.5)
WBC: 9.9 10*3/uL (ref 4.0–10.5)

## 2017-05-23 LAB — COMPREHENSIVE METABOLIC PANEL
ALT: 23 U/L (ref 17–63)
AST: 22 U/L (ref 15–41)
Albumin: 3.1 g/dL — ABNORMAL LOW (ref 3.5–5.0)
Alkaline Phosphatase: 84 U/L (ref 38–126)
Anion gap: 9 (ref 5–15)
BUN: 26 mg/dL — AB (ref 6–20)
CHLORIDE: 108 mmol/L (ref 101–111)
CO2: 23 mmol/L (ref 22–32)
CREATININE: 1.47 mg/dL — AB (ref 0.61–1.24)
Calcium: 9.1 mg/dL (ref 8.9–10.3)
GFR calc Af Amer: 56 mL/min — ABNORMAL LOW (ref >60)
GFR calc non Af Amer: 49 mL/min — ABNORMAL LOW (ref >60)
Glucose, Bld: 104 mg/dL — ABNORMAL HIGH (ref 65–99)
Potassium: 4 mmol/L (ref 3.5–5.1)
SODIUM: 140 mmol/L (ref 135–145)
Total Bilirubin: 0.8 mg/dL (ref 0.3–1.2)
Total Protein: 7.5 g/dL (ref 6.5–8.1)

## 2017-05-23 LAB — GLUCOSE, CAPILLARY
Glucose-Capillary: 94 mg/dL (ref 65–99)
Glucose-Capillary: 121 mg/dL — ABNORMAL HIGH (ref 65–99)
Glucose-Capillary: 98 mg/dL (ref 65–99)
Glucose-Capillary: 113 mg/dL — ABNORMAL HIGH (ref 65–99)

## 2017-05-23 MED ORDER — TORSEMIDE 20 MG PO TABS
20.0000 mg | ORAL_TABLET | Freq: Every day | ORAL | Status: DC
  Administered 2017-05-23: 20 mg via ORAL
  Filled 2017-05-23 (×2): qty 1

## 2017-05-23 NOTE — Progress Notes (Signed)
Patient ID: [name], male   DOB: 03-23-1951, 66 y.o.   MRN: [id number]                                                                PROGRESS NOTE                                                                                                                                                                                                             Patient Demographics:    [name], is a 66 y.o. male, DOB - Nov 29, 1950, MVH:[id number]  Admit date - 04/24/2017   Admitting Physician Briscoe Deutscher, MD  Outpatient Primary MD for the patient is No primary care provider on file. Vita Bland   LOS - 29  Outpatient Specialists:   Dr. Tanja Port (ENT), trach   Chief Complaint  Patient presents with  . Weakness       Brief Narrative   67 year old man PMH chronic hypoxic respiratory failure, tracheostomy, COPD, chronic diastolic heart failure, morbid obesity, presented with sepsis secondary to UTI, acute kidney injury. Hospital course complicated by periods of impulsive behavior and confusion, subsequently evaluated by psychiatry and deemed not to have capacity. Awaiting guardianship and placement. Social worker following.   Subjective:    [name] today states that his breathing is good, he denies dysuria, hematuria.    No headache, No chest pain, No abdominal pain - No Nausea, No new weakness tingling or numbness, No Cough - SOB.   Assessment  & Plan :    Principal Problem:   AKI (acute kidney injury) (HCC) Active Problems:   BMI 50.0-59.9, adult (HCC)   Chronic deep vein thrombosis (DVT) (HCC)   Obesity hypoventilation syndrome (HCC)   Chronic respiratory failure with hypoxia (HCC)   Tracheostomy status (HCC)   Chronic diastolic congestive heart failure (HCC)   Hypokalemia   Depression with anxiety   Chronic renal failure, stage 2 (mild)   DM (diabetes mellitus) type II controlled with renal manifestation (HCC)   OSA (obstructive sleep apnea)   Hyponatremia  Failure to thrive in adult   Obesity, Class III, BMI 40-49.9 (morbid obesity) (HCC)   Pressure injury of skin   Acute metabolic encephalopathy, underlying dementia. Per psychiatry does not have capacity. Outpatient neurology evaluation recommended. -Social worker continues to  follow for placement purposes.  Sepsis secondary to UTI, resolved. He has completed a course of antibiotics.  Acute kidney injury superimposed on chronic kidney disease stage III, appears stable. Check cmp in am  Chronic diastolic congestive heart failure, appears stable.  Restart torsemide  po qday (prev on  am, and  pm)  Morbid obesity with obesity hypoventilation syndrome, obstructive sleep apnea, chronic hypoxic/hypercapnic respiratory failure. Continue tracheostomy care.  Diabetes mellitus type 2, stable. -Continue sliding scale insulin  Morbid obesity.  Anemia Check cbc in am   Code Status : FULL CODE  Family Communication  : w patient  Disposition Plan  :  SNF, pt unable to walk by self more than a few feet  Barriers For Discharge :   Consults  :   none  Procedures  :   DVT Prophylaxis  :  Lovenox -- SCDs     Lab Results  Component Value Date   PLT 333 05/23/2017      Anti-infectives    Start     Dose/Rate Route Frequency Ordered Stop   05/01/17 0930  ciprofloxacin (CIPRO) tablet 500 mg  Status:  Discontinued     500 mg Oral 2 times daily 05/01/17 0924 05/09/17 1050   04/27/17 0900  fluconazole (DIFLUCAN) IVPB 100 mg  Status:  Discontinued     100 mg 50 mL/hr over 60 Minutes Intravenous Every 24 hours 04/27/17 0752 04/27/17 0754   04/27/17 0900  fluconazole (DIFLUCAN) IVPB 100 mg     100 mg 50 mL/hr over 60 Minutes Intravenous Every 24 hours 04/27/17 0754 04/28/17 1003   04/25/17 0315  cefTRIAXone (ROCEPHIN) 1 g in dextrose 5 % 50 mL IVPB  Status:  Discontinued     1 g 100 mL/hr over 30 Minutes Intravenous Daily at bedtime 04/25/17 0310 05/01/17 0924         Objective:   Vitals:   05/23/17 0338 05/23/17 0423 05/23/17 0832 05/23/17 1010  BP:  121/76  122/82  Pulse:  81    Resp: 16 18    Temp:  98.1 F (36.7 C)    TempSrc:  Oral    SpO2:  98% 97%   Weight:  130.5 kg (287 lb 11.2 oz)    Height:        Wt Readings from Last 3 Encounters:  05/23/17 130.5 kg (287 lb 11.2 oz)  10/15/16 118.4 kg (261 lb)  09/14/16 113.4 kg (250 lb)     Intake/Output Summary (Last 24 hours) at 05/23/17 1134 Last data filed at 05/23/17 0810  Gross per 24 hour  Intake              460 ml  Output             1375 ml  Net             -915 ml     Physical Exam  Awake Alert, Oriented X 3, No new F.N deficits, Normal affect .AT,PERRAL Supple Neck, + tracheostomy, No JVD, No cervical lymphadenopathy appriciated.  Symmetrical Chest wall movement, Good air movement bilaterally, CTAB RRR,No Gallops,Rubs or new Murmurs, No Parasternal Heave Morbid obeisity +ve B.Sounds, Abd Soft, No tenderness, No organomegaly appriciated, No rebound - guarding or rigidity. No Cyanosis, Clubbing or edema, No new Rash or bruise      Data Review:    CBC  Recent Labs Lab 05/18/17 2342 05/23/17 0425  WBC 7.8 9.9  HGB 11.1* 10.9*  HCT 33.9* 34.1*  PLT 329  333  MCV 85.2 85.7  MCH 27.9 27.4  MCHC 32.7 32.0  RDW 21.0* 21.3*    Chemistries   Recent Labs Lab 05/19/17 0452 05/23/17 0425  NA 137 140  K 3.6 4.0  CL 106 108  CO2 23 23  GLUCOSE 101* 104*  BUN 19 26*  CREATININE 1.58* 1.47*  CALCIUM 8.9 9.1  AST  --  22  ALT  --  23  ALKPHOS  --  84  BILITOT  --  0.8   ------------------------------------------------------------------------------------------------------------------ No results for input(s): CHOL, HDL, LDLCALC, TRIG, CHOLHDL, LDLDIRECT in the last 72 hours.  Lab Results  Component Value Date   HGBA1C 7.3 (H) 04/26/2017    ------------------------------------------------------------------------------------------------------------------ No results for input(s): TSH, T4TOTAL, T3FREE, THYROIDAB in the last 72 hours.  Invalid input(s): FREET3 ------------------------------------------------------------------------------------------------------------------ No results for input(s): VITAMINB12, FOLATE, FERRITIN, TIBC, IRON, RETICCTPCT in the last 72 hours.  Coagulation profile No results for input(s): INR, PROTIME in the last 168 hours.  No results for input(s): DDIMER in the last 72 hours.  Cardiac Enzymes No results for input(s): CKMB, TROPONINI, MYOGLOBIN in the last 168 hours.  Invalid input(s): CK ------------------------------------------------------------------------------------------------------------------    Component Value Date/Time   BNP 48.6 04/24/2017 2059    Inpatient Medications  Scheduled Meds: . benzonatate  100 mg Oral TID  . chlorhexidine  15 mL Mouth Rinse BID  . docusate sodium  100 mg Oral BID  . enoxaparin (LOVENOX) injection  40 mg Subcutaneous Q24H  . fluticasone  2 spray Each Nare Daily  . guaiFENesin-dextromethorphan  5 mL Oral TID  . hydrALAZINE  37.5 mg Oral BID  . hydrocerin   Topical BID  . insulin aspart  0-5 Units Subcutaneous QHS  . insulin aspart  0-9 Units Subcutaneous TID WC  . liver oil-zinc oxide   Topical TID  . mouth rinse  15 mL Mouth Rinse q12n4p  . sodium chloride flush  3 mL Intravenous Q12H  . torsemide  20 mg Oral Daily   Continuous Infusions: PRN Meds:.acetaminophen **OR** acetaminophen, bisacodyl, diphenhydrAMINE, HYDROcodone-acetaminophen, ipratropium-albuterol, menthol-cetylpyridinium, ondansetron **OR** ondansetron (ZOFRAN) IV, senna-docusate  Micro Results No results found for this or any previous visit (from the past 240 hour(s)).  Radiology Reports Dg Chest 2 View  Result Date: 04/24/2017 CLINICAL DATA:  Increasing weakness EXAM:  CHEST  2 VIEW COMPARISON:  03/14/2016 FINDINGS: Tracheostomy tube is again identified. Cardiac shadow is stable. The lungs are well aerated bilaterally. No focal infiltrate or sizable effusion is seen. Degenerative changes of the thoracic spine are noted. IMPRESSION: No acute abnormality seen. Electronically Signed   By: Alcide Clever M.D.   On: 04/24/2017 20:57   Dg Lumbar Spine Complete  Result Date: 04/24/2017 CLINICAL DATA:  Increasing weakness with inability to stand EXAM: LUMBAR SPINE - COMPLETE 4+ VIEW COMPARISON:  06/25/2015 FINDINGS: Five lumbar type vertebral bodies are well visualized. Vertebral body height is well maintained. Multilevel osteophytic changes are seen. Disc space narrowing is noted at multiple levels worst at L2-3 and L3-4. Aortic calcifications are noted. An IVC filter is noted in satisfactory position. No overlying soft tissue abnormality is seen. IMPRESSION: Multilevel degenerative change without acute abnormality. Electronically Signed   By: Alcide Clever M.D.   On: 04/24/2017 20:58   Dg Abd 1 View  Result Date: 05/19/2017 CLINICAL DATA:  IVC filter assessment. EXAM: ABDOMEN - 1 VIEW COMPARISON:  06/25/2015 abdomen radiographs, CT from 06/24/2015 FINDINGS: Similarly positioned IVC filter is noted to the right of the lumbar spine with  apex at the inferior endplate level of L1 and prongs deployed at the mid L3 level. Bowel gas pattern is unremarkable. There is lumbar spondylosis. Numerous calcifications are noted of the left kidney, similar in appearance. IMPRESSION: 1. IVC filter is noted to the right of the lumbar spine and is without significant change. 2. Left-sided nephrolithiasis. 3. Unremarkable bowel gas pattern. Electronically Signed   By: Tollie Eth M.D.   On: 05/19/2017 15:00   Ct Head Wo Contrast  Result Date: 04/24/2017 CLINICAL DATA:  Increased weakness unable to stand EXAM: CT HEAD WITHOUT CONTRAST TECHNIQUE: Contiguous axial images were obtained from the base of  the skull through the vertex without intravenous contrast. COMPARISON:  01/28/2015 FINDINGS: Brain: No acute territorial infarction, hemorrhage, or intracranial mass is seen. Mild small vessel ischemic changes of the white matter. Stable ventricle size. Mild atrophy. Vascular: No hyperdense vessels.  Carotid artery calcifications. Skull: No fracture or suspicious bone lesion. Sinuses/Orbits: No acute finding. Other: None IMPRESSION: No CT evidence for acute intracranial abnormality. Small vessel ischemic changes of the white matter and mild atrophy Electronically Signed   By: Jasmine Pang M.D.   On: 04/24/2017 21:02   US Renal  Result Date: 04/25/2017 CLINICAL DATA:  Acute renal insufficiency. EXAM: RENAL / URINARY TRACT ULTRASOUND COMPLETE COMPARISON:  CT scan June 24, 2015 FINDINGS: Right Kidney: Length: 12.9 cm. Echogenicity within normal limits. No mass or hydronephrosis visualized. Left Kidney: Length: 12.5 cm. There is a 6.3 x 4.2 x 5.7 cm hypoechoic structure along the medial aspect of the left renal midpole. A prominent extrarenal pelvis was seen in this region on previous CT imaging. Bladder: The bladder was empty and not well evaluated. IMPRESSION: 1. No cause for acute renal failure identified. 2. The 6.3 x 4.2 x 5.7 cm hypoechoic structure along the medial aspect of the left renal midpole probably correlates with a prominent extrarenal pelvis seen on previous CT imaging. Electronically Signed   By: Gerome Sam III M.D   On: 04/25/2017 08:26   Dg Chest Port 1 View  Result Date: 04/27/2017 CLINICAL DATA:  Re- insertion of tracheostomy tube. EXAM: PORTABLE CHEST 1 VIEW COMPARISON:  04/24/2017 FINDINGS: The tip of a tracheostomy tube is seen centered over the superior mediastinum and upper trachea with tip terminating at the level of the aortic arch. Heart is enlarged with minimal aortic atherosclerosis. Mild vascular congestion is noted. No effusion or pneumothorax. No pulmonary  consolidation. No acute nor suspicious osseous abnormality. IMPRESSION: 1. Cardiomegaly with mild pulmonary vascular congestion. 2. Minimal aortic atherosclerosis. 3. Tip of a tracheostomy tube is centered within the upper trachea with tip extending to the level of the aortic arch. Electronically Signed   By: Tollie Eth M.D.   On: 04/27/2017 00:21    Time Spent in minutes  30   Pearson Grippe M.D on 05/23/2017 at 11:34 AM  Between 7am to 7pm - Pager - [phone]  After 7pm go to [email or web address] - password [email or web address]  [facility] -  Office  [phone]

## 2017-05-23 NOTE — Discharge Summary (Addendum)
[name], is a 66 y.o. male  DOB 09-03-1951  MRN [id number].  Admission date:  04/24/2017  Admitting Physician  Vianne Bulls, MD  Discharge Date:  05/25/2017   Primary MD  No primary care provider on file.  Recommendations for primary care physician for things to follow:    Acute metabolic encephalopathy, underlying dementia. Per psychiatry does not have capacity.  [facility] for taking this patient  Sepsis secondary to UTI, resolved. He has completed a course of antibiotics.  Acute kidney injury superimposed on chronic kidney disease stage III, appears stable. Check cmp in 3-5 days  Chronic diastolic congestive heart failure, appears stable. No torsemide for now, monitor daily weight, if gains >5lbs consider restarting torsemide Check cmp in 3-5 days  Morbid obesity with obesity hypoventilation syndrome, obstructive sleep apnea, chronic hypoxic/hypercapnic respiratory failure. Continue tracheostomy care.  Diabetes mellitus type 2, stable. -Continue sliding scale insulin  Morbid obesity.  Chronic DVT S/p ivc filter Resume xarelto  Anemia Check cbc in 3-5 days      Code Status:FULL CODE   Admission Diagnosis  AKI (acute kidney injury) ([place]) [N17.9] Acute renal failure, unspecified acute renal failure type ([place]) [N17.9] Leukocytosis, unspecified type [D72.829]   Discharge Diagnosis  AKI (acute kidney injury) ([place]) [N17.9] Acute renal failure, unspecified acute renal failure type ([place]) [N17.9] Leukocytosis, unspecified type [D72.829]    Principal Problem:   AKI (acute kidney injury) ([place]) Active Problems:   BMI 50.0-59.9, adult (HCC)   Chronic deep vein thrombosis (DVT) (HCC)   Obesity hypoventilation syndrome (HCC)   Chronic respiratory failure with hypoxia (HCC)   Tracheostomy status (HCC)   Chronic diastolic congestive heart failure (HCC)  Hypokalemia   Depression with anxiety   Chronic renal failure, stage 2 (mild)   DM (diabetes mellitus) type II controlled with renal manifestation (HCC)   OSA (obstructive sleep apnea)   Hyponatremia   Failure to thrive in adult   Obesity, Class III, BMI 40-49.9 (morbid obesity) ([place])   Pressure injury of skin      Past Medical History:  Diagnosis Date  . Arthritis   . CHF (congestive heart failure) ([place])   . COPD (chronic obstructive pulmonary disease) ([place])   . Diabetes ([place])   . Hyperlipidemia   . Hypertension   . Morbid obesity ([place])   . Multiple abrasions    rt arm  . RCT (rotator cuff tear)     Past Surgical History:  Procedure Laterality Date  . 25819    . NO PAST SURGERIES    . SHOULDER ARTHROSCOPY WITH SUBACROMIAL DECOMPRESSION, ROTATOR CUFF REPAIR AND BICEP TENDON REPAIR Right 12/28/2014   Procedure: RIGHT SHOULDER ARTHROSCOPY WITH SUBACROMIAL DECOMPRESSION, DISTAL CLAVICLE RESECTION, ROTATOR CUFF REPAIR ;  Surgeon: Sydnee Cabal, MD;  Location: [facility];  Service: Orthopedics;  Laterality: Right;  . TRACHEOSTOMY REVISION N/A 01/18/2015   Procedure: TRACHEOSTOMY REVISION;  Surgeon: Ruby Cola, MD;  Location: [place];  Service: ENT;  Laterality: N/A;  . TRACHEOSTOMY TUBE PLACEMENT  N/A 01/16/2015   Procedure: TRACHEOSTOMY;  Surgeon: Jodi Marble, MD;  Location: [place];  Service: ENT;  Laterality: N/A;  . TRACHEOSTOMY TUBE PLACEMENT N/A 01/22/2015   Procedure: TRACHEOSTOMY;  Surgeon: Jodi Marble, MD;  Location: [facility] OR;  Service: ENT;  Laterality: N/A;       HPI  from the history and physical done on the day of admission:     66 y.o. male with medical history significant for COPD, chronic diastolic CHF, hypertension, morbid obesity, chronic kidney disease stage II, and OSA, into the emergency department for evaluation of generalized weakness. Patient reports generalized weakness and worsening insidiously over the past few months, and particularly over the past 2 weeks.  Over the past 2 weeks, he has been lying on a couch at a friend's home, unable to get up due to profound generalized weakness, reportedly urinating and defecating on himself over this interval. Eventually, EMS was called out tonight for transport to the hospital. Denies headache, change in vision or hearing, or focal numbness or weakness. No recent fevers or chills.denies recent fall or trauma.  ED Course: Upon arrival to the ED, patient is found to be afebrile, saturating well on room air, with vitals otherwise stable. EKG features a sinus rhythm with PACs and right bundle branch block. Chest x-ray is negative for acute cardiac pulmonary disease and no acute findings are appreciated on the lumbar spine radiography. Noncontrast head CT is negative for acute intracranial abnormality. Chemistry panel features sodium 128, potassium 2.7, BUN 110, and creatinine of 3.43, up from 1.2 last August. CBC features a leukocytosis to 18,200, chronic thrombocytosis with platelets 498,000, and a new normocytic anemia with hemoglobin of 11.6. Troponin and BNP are within normal limits. Urinalysis was ordered and remains pending. Patient was treated with a liter of normal saline and 40 mEq oral potassium in the ED. He remained hemodynamically stable and has not been in any apparent respiratory distress. He'll be admitted to telemetry unit for ongoing evaluation and management of generalized weakness and failure to thrive with acute kidney injury and marked electrolyte derangements.     Hospital Course:     Pt was admitted for AKI on CKD.  Creatinine 3.43 on admission up from 1.2 about 1 year prior to admission.  Pt was also noted to be hypokalemic with potassium of 2.7 which was repleted.  Pt also hyponatremic Na=128, and tx with NS iv.  Diuretics (Torsemide) held due to dehydration.   Wbc 18, secondary to UTI and patient was started on Rocephin 1gm iv qday. Pt thought to have met sepsis criteria.   Due to intertriginous  dermatitis pt was given diflucan iv x2  (8/21, 8/22), mutiple wounds (partial thickness) treated by wound care.  Recommended New Prevalon Boots and Gerhardts Butt Cream.  Psychiatry consulted (04/30/2017)=> pt does not meet criteria for capacity to make his own medical decisions and living arrangements.  DSS custodian recommended.   Urine culture 8/19=> >100,000 Morganella Morganii, sensitive to rocephin.  Pt completed 7 days of rocephin.   Social work continued to work on [place] in [place] appears interested pt is awaiting approval by [place] custodian.    Pt restarted on torsemide '20mg'$  po qday on 9/16 and renal function worsened. Torsemide stopped.  Creatinine improved to 1.74,  Pt appears stable and will be discharged to SNF.  Marland Kitchen     Follow UP  [place] Follow up in 1 day(s).   Contact information: [address] [place], [place] [address] [phone]           Consults obtained - wound care  Discharge Condition: stable  Diet and Activity recommendation: See Discharge Instructions below  Discharge Instructions         Discharge Medications     Allergies as of 05/25/2017   No Known Allergies     Medication List    STOP taking these medications   indapamide 2.5 MG tablet Commonly known as:  LOZOL   torsemide 20 MG tablet Commonly known as:  DEMADEX   triamterene 50 MG capsule Commonly known as:  DYRENIUM     TAKE these medications   atorvastatin 20 MG tablet Commonly known as:  LIPITOR Take 20 mg by mouth daily.   benzonatate 100 MG capsule Commonly known as:  TESSALON Take 1 capsule (100 mg total) by mouth 3 (three) times daily.   bisacodyl 5 MG EC tablet Commonly known as:  DULCOLAX Take 1 tablet (5 mg total) by mouth daily as needed for moderate constipation.   diphenhydrAMINE 25 MG tablet Commonly known as:  BENADRYL Take 25 mg by mouth every 6 (six) hours as needed for itching.   docusate sodium  100 MG capsule Commonly known as:  COLACE Take 1 capsule (100 mg total) by mouth 2 (two) times daily.   guaiFENesin-dextromethorphan 100-10 MG/5ML syrup Commonly known as:  ROBITUSSIN DM Take 5 mLs by mouth 3 (three) times daily.   hydrALAZINE 25 MG tablet Commonly known as:  APRESOLINE Take 37.5 mg by mouth 2 (two) times daily. Give 1.5 mg by mouth two times daily   hydrocerin Crea Apply 30 application topically 2 (two) times daily.   insulin aspart 100 UNIT/ML injection Commonly known as:  novoLOG Inject 0-5 Units into the skin at bedtime.   insulin aspart 100 UNIT/ML injection Commonly known as:  novoLOG Inject 0-9 Units into the skin 3 (three) times daily with meals.   ipratropium-albuterol 0.5-2.5 (3) MG/3ML Soln Commonly known as:  DUONEB Take 3 mLs by nebulization every 4 (four) hours as needed. What changed:  when to take this  additional instructions   liver oil-zinc oxide 40 % ointment Commonly known as:  DESITIN Apply topically 3 (three) times daily.   mouth rinse Liqd solution 15 mLs by Mouth Rinse route 2 times daily at 12 noon and 4 pm.   rivaroxaban 20 MG Tabs tablet Commonly known as:  XARELTO Take 20 mg by mouth daily with supper.            Discharge Care Instructions        Start     Ordered   05/25/17 0000  benzonatate (TESSALON) 100 MG capsule  3 times daily     05/25/17 0920   05/25/17 0000  bisacodyl (DULCOLAX) 5 MG EC tablet  Daily PRN     05/25/17 0920   05/25/17 0000  guaiFENesin-dextromethorphan (ROBITUSSIN DM) 100-10 MG/5ML syrup  3 times daily     05/25/17 0920   05/25/17 0000  hydrocerin (EUCERIN) CREA  2 times daily     05/25/17 0920   05/25/17 0000  insulin aspart (NOVOLOG) 100 UNIT/ML injection  Daily at bedtime     05/25/17 0920   05/25/17 0000  insulin aspart (NOVOLOG) 100 UNIT/ML injection  3 times daily with meals     05/25/17 0920   05/25/17 0000  ipratropium-albuterol (DUONEB) 0.5-2.5 (3) MG/3ML SOLN  Every 4  hours PRN     05/25/17 0920  05/25/17 0000  liver oil-zinc oxide (DESITIN) 40 % ointment  3 times daily     05/25/17 0920   05/25/17 0000  mouth rinse LIQD solution  2 times daily     05/25/17 0920      Major procedures and Radiology Reports - PLEASE review detailed and final reports for all details, in brief -      Dg Abd 1 View  Result Date: 05/19/2017 CLINICAL DATA:  IVC filter assessment. EXAM: ABDOMEN - 1 VIEW COMPARISON:  06/25/2015 abdomen radiographs, CT from 06/24/2015 FINDINGS: Similarly positioned IVC filter is noted to the right of the lumbar spine with apex at the inferior endplate level of L1 and prongs deployed at the mid L3 level. Bowel gas pattern is unremarkable. There is lumbar spondylosis. Numerous calcifications are noted of the left kidney, similar in appearance. IMPRESSION: 1. IVC filter is noted to the right of the lumbar spine and is without significant change. 2. Left-sided nephrolithiasis. 3. Unremarkable bowel gas pattern. Electronically Signed   By: Ashley Royalty M.D.   On: 05/19/2017 15:00   Dg Chest Port 1 View  Result Date: 04/27/2017 CLINICAL DATA:  Re- insertion of tracheostomy tube. EXAM: PORTABLE CHEST 1 VIEW COMPARISON:  04/24/2017 FINDINGS: The tip of a tracheostomy tube is seen centered over the superior mediastinum and upper trachea with tip terminating at the level of the aortic arch. Heart is enlarged with minimal aortic atherosclerosis. Mild vascular congestion is noted. No effusion or pneumothorax. No pulmonary consolidation. No acute nor suspicious osseous abnormality. IMPRESSION: 1. Cardiomegaly with mild pulmonary vascular congestion. 2. Minimal aortic atherosclerosis. 3. Tip of a tracheostomy tube is centered within the upper trachea with tip extending to the level of the aortic arch. Electronically Signed   By: Ashley Royalty M.D.   On: 04/27/2017 00:21    Micro Results     No results found for this or any previous visit (from the past 240  hour(s)).     Today   Subjective    [name] today is still deconditioned in need of SNF placement, for rehab to walk.    no headache,no chest abdominal pain,no new weakness tingling or numbness, feels much better wants to go home today.   Objective   Blood pressure 135/71, pulse 88, temperature 98.7 F (37.1 C), temperature source Oral, resp. rate 18, height 6' (1.829 m), weight 129.8 kg (286 lb 1.6 oz), SpO2 100 %.   Intake/Output Summary (Last 24 hours) at 05/25/17 0933 Last data filed at 05/25/17 0431  Gross per 24 hour  Intake                0 ml  Output             1100 ml  Net            -1100 ml    Exam Awake Alert, Oriented x 3, No new F.N deficits, Normal affect [place].AT,PERRAL Supple Neck, + trach, No JVD, No cervical lymphadenopathy appriciated.  Symmetrical Chest wall movement, Good air movement bilaterally, CTAB RRR,No Gallops,Rubs or new Murmurs, No Parasternal Heave +ve B.Sounds, Abd Soft, Non tender, No organomegaly appriciated, No rebound -guarding or rigidity. No Cyanosis, Clubbing or edema, No new Rash or bruise Gait instability   Data Review   CBC w Diff:  Lab Results  Component Value Date   WBC 9.9 05/23/2017   HGB 10.9 (L) 05/23/2017   HCT 34.1 (L) 05/23/2017   HCT 32.3 (L) 01/31/2015  PLT 333 05/23/2017   LYMPHOPCT 7 04/25/2017   MONOPCT 7 04/25/2017   EOSPCT 3 04/25/2017   BASOPCT 0 04/25/2017    CMP:  Lab Results  Component Value Date   NA 138 05/25/2017   NA 140 05/01/2016   K 4.0 05/25/2017   CL 107 05/25/2017   CO2 19 (L) 05/25/2017   BUN 28 (H) 05/25/2017   BUN 22 (A) 05/01/2016   CREATININE 1.74 (H) 05/25/2017   GLU 79 05/01/2016   PROT 7.5 05/23/2017   ALBUMIN 3.1 (L) 05/23/2017   BILITOT 0.8 05/23/2017   ALKPHOS 84 05/23/2017   AST 22 05/23/2017   ALT 23 05/23/2017  .   Total Time in preparing paper work, data evaluation and todays exam - 24 minutes  Jani Gravel M.D on 05/25/2017 at 9:33 AM  [facility]   Office  [phone]

## 2017-05-24 LAB — BASIC METABOLIC PANEL
Anion gap: 9 (ref 5–15)
BUN: 30 mg/dL — AB (ref 6–20)
CHLORIDE: 106 mmol/L (ref 101–111)
CO2: 23 mmol/L (ref 22–32)
Calcium: 8.8 mg/dL — ABNORMAL LOW (ref 8.9–10.3)
Creatinine, Ser: 1.84 mg/dL — ABNORMAL HIGH (ref 0.61–1.24)
GFR calc non Af Amer: 37 mL/min — ABNORMAL LOW (ref >60)
GFR, EST AFRICAN AMERICAN: 43 mL/min — AB (ref >60)
Glucose, Bld: 99 mg/dL (ref 65–99)
POTASSIUM: 3.5 mmol/L (ref 3.5–5.1)
SODIUM: 138 mmol/L (ref 135–145)

## 2017-05-24 LAB — GLUCOSE, CAPILLARY
GLUCOSE-CAPILLARY: 118 mg/dL — AB (ref 65–99)
GLUCOSE-CAPILLARY: 116 mg/dL — AB (ref 65–99)
Glucose-Capillary: 123 mg/dL — ABNORMAL HIGH (ref 65–99)
Glucose-Capillary: 112 mg/dL — ABNORMAL HIGH (ref 65–99)

## 2017-05-24 NOTE — Progress Notes (Signed)
Physical Therapy Treatment Patient Details Name: [name] MRN: [id number] DOB: 1951-01-06 Today's Date: 05/24/2017    History of Present Illness  Jonahtan Landino is a 66 y.o. male with medical history significant for COPD, chronic diastolic CHF, hypertension, morbid obesity, chronic kidney disease stage II, and OSA, into the emergency department for evaluation of generalized weakness--per chart pt has been too weak to get off the couch and has been urinating and defecating on himself for days.  Diagnosed with sepsis due to UTI, acute encephalopathy    PT Comments    Pt attempted to mobilize however had increased left foot pain with weight bearing and refused to ambulate.  Pt returned to bed and RN notified.  Follow Up Recommendations  SNF     Equipment Recommendations  None recommended by PT    Recommendations for Other Services       Precautions / Restrictions Precautions Precautions: Fall Precaution Comments: chronic trach with PMV    Mobility  Bed Mobility Overal bed mobility: Needs Assistance Bed Mobility: Supine to Sit;Sit to Supine     Supine to sit: Supervision Sit to supine: Supervision   General bed mobility comments: increased time and effort  Transfers Overall transfer level: Needs assistance Equipment used: Rolling walker (2 wheeled) Transfers: Sit to/from Stand Sit to Stand: Min assist         General transfer comment: verbal cues for safe technique, assist to rise and steady, pt reports increased pain in L foot with any weight bearing, declined any further mobility due to pain (RN notified)  Ambulation/Gait                 Stairs            Wheelchair Mobility    Modified Rankin (Stroke Patients Only)       Balance           Standing balance support: Bilateral upper extremity supported Standing balance-Leahy Scale: Poor                              Cognition Arousal/Alertness: Awake/alert Behavior During  Therapy: WFL for tasks assessed/performed Overall Cognitive Status: Within Functional Limits for tasks assessed                                        Exercises      General Comments        Pertinent Vitals/Pain Pain Assessment: Faces Faces Pain Scale: Hurts even more Pain Location: L foot with weight bearing Pain Descriptors / Indicators: Tender;Grimacing;Sore Pain Intervention(s): Limited activity within patient's tolerance;Monitored during session;Repositioned (notified RN)    Home Living                      Prior Function            PT Goals ([place] goals can now be found in the care plan section) Acute Rehab PT Goals PT Goal Formulation: With patient Time For Goal Achievement: 06/07/17 Potential to Achieve Goals: Good Progress towards PT goals: Progressing toward goals    Frequency    Min 2X/week      PT Plan [place] plan remains appropriate    Co-evaluation              AM-PAC PT "6 Clicks" Daily Activity  Outcome Measure  Difficulty turning over  in bed (including adjusting bedclothes, sheets and blankets)?: A Little Difficulty moving from lying on back to sitting on the side of the bed? : A Little Difficulty sitting down on and standing up from a chair with arms (e.g., wheelchair, bedside commode, etc,.)?: Unable Help needed moving to and from a bed to chair (including a wheelchair)?: Total Help needed walking in hospital room?: Total Help needed climbing 3-5 steps with a railing? : Total 6 Click Score: 10    End of Session Equipment Utilized During Treatment: Gait belt Activity Tolerance: Patient limited by pain Patient left: in bed;with call bell/phone within reach Nurse Communication: Mobility status PT Visit Diagnosis: Muscle weakness (generalized) (M62.81);Difficulty in walking, not elsewhere classified (R26.2)     Time: 1610-9604 PT Time Calculation (min) (ACUTE ONLY): 12 min  Charges:  $Therapeutic  Activity: 8-22 mins                    G Codes:       Zenovia Jarred, PT, DPT 05/24/2017 Pager: [phone]  Maida Sale E 05/24/2017, 1:35 PM

## 2017-05-24 NOTE — Progress Notes (Signed)
Patient ID: [name], male   DOB: 09-11-1950, 66 y.o.   MRN: [id number]                                                                PROGRESS NOTE                                                                                                                                                                                                             Patient Demographics:    [name], is a 66 y.o. male, DOB - [birth year], YNW:[id number]  Admit date - 04/24/2017   Admitting Physician Briscoe Deutscher, MD  Outpatient Primary MD for the patient is No primary care provider on file.  LOS - 30  Outpatient Specialists:     Chief Complaint  Patient presents with  . Weakness       Brief Narrative    66 year old man PMH chronic hypoxic respiratory failure, tracheostomy, COPD, chronic diastolic heart failure, morbid obesity, presented with sepsis secondary to UTI, acute kidney injury. Hospital course complicated by periods of impulsive behavior and confusion, subsequently evaluated by psychiatry and deemed not to have capacity. Awaiting guardianship and placement. Social worker following.   Subjective:    [name] today states that his breathing is fine,  Denies dysuria, hematuria.   Started torsemide yesterday.    No headache, No chest pain, No abdominal pain - No Nausea, No new weakness tingling or numbness, No Cough - SOB.    Assessment  & Plan :    Principal Problem:   AKI (acute kidney injury) (HCC) Active Problems:   BMI 50.0-59.9, adult (HCC)   Chronic deep vein thrombosis (DVT) (HCC)   Obesity hypoventilation syndrome (HCC)   Chronic respiratory failure with hypoxia (HCC)   Tracheostomy status (HCC)   Chronic diastolic congestive heart failure (HCC)   Hypokalemia   Depression with anxiety   Chronic renal failure, stage 2 (mild)   DM (diabetes mellitus) type II controlled with renal manifestation (HCC)   OSA (obstructive sleep apnea)   Hyponatremia   Failure to  thrive in adult   Obesity, Class III, BMI 40-49.9 (morbid obesity) (HCC)   Pressure injury of skin    Acute metabolic encephalopathy, underlying dementia. Per psychiatry does not have capacity. Outpatient neurology evaluation recommended. -Social  worker continues to follow for placement purposes.  Sepsis secondary to UTI, resolved. He has completed a course of antibiotics.  Acute kidney injury superimposed on chronic kidney disease stage III, appears stable. Check cmp in am  Chronic diastolic congestive heart failure, appears stable.  Restart torsemide  po qday (prev on  am, and  pm) (9/16) STOP Torsemide due to increase in creatinine Will restart tomorrow at lower dose  Morbid obesity with obesity hypoventilation syndrome, obstructive sleep apnea, chronic hypoxic/hypercapnic respiratory failure. Continue tracheostomy care.  Diabetes mellitus type 2, stable. -Continue sliding scale insulin  Morbid obesity.  Anemia Check cbc in am   Code Status:FULL CODE  Family Communication :w patient  Disposition Plan:SNF, pt unable to walk by self more than a few feet  Barriers For Discharge:  Consults :none  Procedures :   DVT Prophylaxis: Lovenox-- SCDs  Lab Results  Component Value Date   PLT 333 05/23/2017      Anti-infectives    Start     Dose/Rate Route Frequency Ordered Stop   05/01/17 0930  ciprofloxacin (CIPRO) tablet 500 mg  Status:  Discontinued     500 mg Oral 2 times daily 05/01/17 0924 05/09/17 1050   04/27/17 0900  fluconazole (DIFLUCAN) IVPB 100 mg  Status:  Discontinued     100 mg 50 mL/hr over 60 Minutes Intravenous Every 24 hours 04/27/17 0752 04/27/17 0754   04/27/17 0900  fluconazole (DIFLUCAN) IVPB 100 mg     100 mg 50 mL/hr over 60 Minutes Intravenous Every 24 hours 04/27/17 0754 04/28/17 1003   04/25/17 0315  cefTRIAXone (ROCEPHIN) 1 g in dextrose 5 % 50 mL IVPB  Status:  Discontinued     1 g 100  mL/hr over 30 Minutes Intravenous Daily at bedtime 04/25/17 0310 05/01/17 0924        Objective:   Vitals:   05/23/17 2313 05/24/17 0450 05/24/17 0505 05/24/17 0747  BP:  112/62    Pulse:  76  77  Resp:  Temp:  98.7 F (37.1 C)    TempSrc:  Oral    SpO2: 96% 96%  96%  Weight:  129.8 kg (286 lb 1.6 oz)    Height:        Wt Readings from Last 3 Encounters:  05/24/17 129.8 kg (286 lb 1.6 oz)  10/15/16 118.4 kg (261 lb)  09/14/16 113.4 kg (250 lb)     Intake/Output Summary (Last 24 hours) at 05/24/17 0750 Last data filed at 05/24/17 0458  Gross per 24 hour  Intake              240 ml  Output             2325 ml  Net            -2085 ml     Physical Exam  Awake Alert, Oriented X 3, No new F.N deficits, Normal affect [place].AT,PERRAL Supple Neck,No JVD, No cervical lymphadenopathy appriciated.  Symmetrical Chest wall movement, Good air movement bilaterally, CTAB RRR,No Gallops,Rubs or new Murmurs, No Parasternal Heave Morbid obeisity +ve B.Sounds, Abd Soft, No tenderness, No organomegaly appriciated, No rebound - guarding or rigidity. No Cyanosis, Clubbing or edema, No new Rash or bruise      Data Review:    CBC  Recent Labs Lab 05/18/17 2342 05/23/17 0425  WBC 7.8 9.9  HGB 11.1* 10.9*  HCT 33.9* 34.1*  PLT 329 333  MCV 85.2 85.7  MCH 27.9 27.4  MCHC 32.7 32.0  RDW 21.0* 21.3*    Chemistries   Recent Labs Lab 05/19/17 0452 05/23/17 0425 05/24/17 0433  NA 137 140 138  K 3.6 4.0 3.5  CL 106 108 106  CO2 GLUCOSE 101* 104* 99  BUN 19 26* 30*  CREATININE 1.58* 1.47* 1.84*  CALCIUM 8.9 9.1 8.8*  AST  --  22  --   ALT  --  23  --   ALKPHOS  --  84  --   BILITOT  --  0.8  --    ------------------------------------------------------------------------------------------------------------------ No results for input(s): CHOL, HDL, LDLCALC, TRIG, CHOLHDL, LDLDIRECT in the last 72 hours.  Lab Results  Component Value Date   HGBA1C  7.3 (H) 04/26/2017   ------------------------------------------------------------------------------------------------------------------ No results for input(s): TSH, T4TOTAL, T3FREE, THYROIDAB in the last 72 hours.  Invalid input(s): FREET3 ------------------------------------------------------------------------------------------------------------------ No results for input(s): VITAMINB12, FOLATE, FERRITIN, TIBC, IRON, RETICCTPCT in the last 72 hours.  Coagulation profile No results for input(s): INR, PROTIME in the last 168 hours.  No results for input(s): DDIMER in the last 72 hours.  Cardiac Enzymes No results for input(s): CKMB, TROPONINI, MYOGLOBIN in the last 168 hours.  Invalid input(s): CK ------------------------------------------------------------------------------------------------------------------    Component Value Date/Time   BNP 48.6 04/24/2017 2059    Inpatient Medications  Scheduled Meds: . benzonatate  100 mg Oral TID  . chlorhexidine  15 mL Mouth Rinse BID  . docusate sodium  100 mg Oral BID  . enoxaparin (LOVENOX) injection  40 mg Subcutaneous Q24H  . fluticasone  2 spray Each Nare Daily  . guaiFENesin-dextromethorphan  5 mL Oral TID  . hydrALAZINE  37.5 mg Oral BID  . hydrocerin   Topical BID  . insulin aspart  0-5 Units Subcutaneous QHS  . insulin aspart  0-9 Units Subcutaneous TID WC  . liver oil-zinc oxide   Topical TID  . mouth rinse  15 mL Mouth Rinse q12n4p  . sodium chloride flush  3 mL Intravenous Q12H  . torsemide  20 mg Oral Daily   Continuous Infusions: PRN Meds:.acetaminophen **OR** acetaminophen, bisacodyl, diphenhydrAMINE, HYDROcodone-acetaminophen, ipratropium-albuterol, menthol-cetylpyridinium, ondansetron **OR** ondansetron (ZOFRAN) IV, senna-docusate  Micro Results No results found for this or any previous visit (from the past 240 hour(s)).  Radiology Reports Dg Chest 2 View  Result Date: 04/24/2017 CLINICAL DATA:  Increasing  weakness EXAM: CHEST  2 VIEW COMPARISON:  03/14/2016 FINDINGS: Tracheostomy tube is again identified. Cardiac shadow is stable. The lungs are well aerated bilaterally. No focal infiltrate or sizable effusion is seen. Degenerative changes of the thoracic spine are noted. IMPRESSION: No acute abnormality seen. Electronically Signed   By: Alcide Clever M.D.   On: 04/24/2017 20:57   Dg Lumbar Spine Complete  Result Date: 04/24/2017 CLINICAL DATA:  Increasing weakness with inability to stand EXAM: LUMBAR SPINE - COMPLETE 4+ VIEW COMPARISON:  06/25/2015 FINDINGS: Five lumbar type vertebral bodies are well visualized. Vertebral body height is well maintained. Multilevel osteophytic changes are seen. Disc space narrowing is noted at multiple levels worst at L2-3 and L3-4. Aortic calcifications are noted. An IVC filter is noted in satisfactory position. No overlying soft tissue abnormality is seen. IMPRESSION: Multilevel degenerative change without acute abnormality. Electronically Signed   By: Alcide Clever M.D.   On: 04/24/2017 20:58   Dg Abd 1 View  Result Date: 05/19/2017 CLINICAL DATA:  IVC filter assessment. EXAM: ABDOMEN - 1 VIEW COMPARISON:  06/25/2015 abdomen radiographs, CT from 06/24/2015 FINDINGS: Similarly positioned  IVC filter is noted to the right of the lumbar spine with apex at the inferior endplate level of L1 and prongs deployed at the mid L3 level. Bowel gas pattern is unremarkable. There is lumbar spondylosis. Numerous calcifications are noted of the left kidney, similar in appearance. IMPRESSION: 1. IVC filter is noted to the right of the lumbar spine and is without significant change. 2. Left-sided nephrolithiasis. 3. Unremarkable bowel gas pattern. Electronically Signed   By: Tollie Eth M.D.   On: 05/19/2017 15:00   Ct Head Wo Contrast  Result Date: 04/24/2017 CLINICAL DATA:  Increased weakness unable to stand EXAM: CT HEAD WITHOUT CONTRAST TECHNIQUE: Contiguous axial images were obtained  from the base of the skull through the vertex without intravenous contrast. COMPARISON:  01/28/2015 FINDINGS: Brain: No acute territorial infarction, hemorrhage, or intracranial mass is seen. Mild small vessel ischemic changes of the white matter. Stable ventricle size. Mild atrophy. Vascular: No hyperdense vessels.  Carotid artery calcifications. Skull: No fracture or suspicious bone lesion. Sinuses/Orbits: No acute finding. Other: None IMPRESSION: No CT evidence for acute intracranial abnormality. Small vessel ischemic changes of the white matter and mild atrophy Electronically Signed   By: Jasmine Pang M.D.   On: 04/24/2017 21:02   US Renal  Result Date: 04/25/2017 CLINICAL DATA:  Acute renal insufficiency. EXAM: RENAL / URINARY TRACT ULTRASOUND COMPLETE COMPARISON:  CT scan June 24, 2015 FINDINGS: Right Kidney: Length: 12.9 cm. Echogenicity within normal limits. No mass or hydronephrosis visualized. Left Kidney: Length: 12.5 cm. There is a 6.3 x 4.2 x 5.7 cm hypoechoic structure along the medial aspect of the left renal midpole. A prominent extrarenal pelvis was seen in this region on previous CT imaging. Bladder: The bladder was empty and not well evaluated. IMPRESSION: 1. No cause for acute renal failure identified. 2. The 6.3 x 4.2 x 5.7 cm hypoechoic structure along the medial aspect of the left renal midpole probably correlates with a prominent extrarenal pelvis seen on previous CT imaging. Electronically Signed   By: Gerome Sam III M.D   On: 04/25/2017 08:26   Dg Chest Port 1 View  Result Date: 04/27/2017 CLINICAL DATA:  Re- insertion of tracheostomy tube. EXAM: PORTABLE CHEST 1 VIEW COMPARISON:  04/24/2017 FINDINGS: The tip of a tracheostomy tube is seen centered over the superior mediastinum and upper trachea with tip terminating at the level of the aortic arch. Heart is enlarged with minimal aortic atherosclerosis. Mild vascular congestion is noted. No effusion or pneumothorax. No  pulmonary consolidation. No acute nor suspicious osseous abnormality. IMPRESSION: 1. Cardiomegaly with mild pulmonary vascular congestion. 2. Minimal aortic atherosclerosis. 3. Tip of a tracheostomy tube is centered within the upper trachea with tip extending to the level of the aortic arch. Electronically Signed   By: Tollie Eth M.D.   On: 04/27/2017 00:21    Time Spent in minutes  30   Pearson Grippe M.D on 05/24/2017 at 7:50 AM  Between 7am to 7pm - Pager - [phone]  After 7pm go to [email or web address] - password [facility]  [facility] -  Office  [phone]

## 2017-05-24 NOTE — Progress Notes (Signed)
CSW spoke with DSS staff member Alinda Money [facility] [phone]) who confirmed that patient was appointed an interim Guardian "[name]". Staff reported that patient's interim Guardian was out of the office today and agreed that he would be able to assist with patient's placement process. Staff reported that CSW could provide his number to SNF and that he can complete paperwork for patient. Staff reported that he would provide CSW with a copy of patient's guardianship paperwork after retrieving it from the court house.   CSW provided update to [facility] SN[facility] staff member Rayfield Citizen. Staff reported that patient could arrive tomorrow after they receive guardianship paperwork and speak with patient's guardian. Staff reported that she would order patient's trach supplies that should arrive tomorrow. CSW provided staff with DSS contact information.  CSW continuing to follow and assist with dc planning.   Celso Sickle, [place] Clinical Social Worker [facility] Cell#: [phone]

## 2017-05-25 ENCOUNTER — Encounter (HOSPITAL_COMMUNITY): Payer: Self-pay | Admitting: Internal Medicine

## 2017-05-25 LAB — BASIC METABOLIC PANEL
ANION GAP: 12 (ref 5–15)
BUN: 28 mg/dL — ABNORMAL HIGH (ref 6–20)
CALCIUM: 8.7 mg/dL — AB (ref 8.9–10.3)
CHLORIDE: 107 mmol/L (ref 101–111)
CO2: 19 mmol/L — AB (ref 22–32)
Creatinine, Ser: 1.74 mg/dL — ABNORMAL HIGH (ref 0.61–1.24)
GFR calc non Af Amer: 40 mL/min — ABNORMAL LOW (ref >60)
GFR, EST AFRICAN AMERICAN: 46 mL/min — AB (ref >60)
GLUCOSE: 104 mg/dL — AB (ref 65–99)
Potassium: 4 mmol/L (ref 3.5–5.1)
Sodium: 138 mmol/L (ref 135–145)

## 2017-05-25 LAB — GLUCOSE, CAPILLARY
Glucose-Capillary: 98 mg/dL (ref 65–99)
Glucose-Capillary: 139 mg/dL — ABNORMAL HIGH (ref 65–99)

## 2017-05-25 MED ORDER — ORAL CARE MOUTH RINSE: 15.0000 mL | Freq: Two times a day (BID) | OROMUCOSAL | 0 refills | Status: AC

## 2017-05-25 MED ORDER — INSULIN ASPART 100 UNIT/ML ~~LOC~~ SOLN: 0.0000 [IU] | Freq: Every day | SUBCUTANEOUS | 11 refills | Status: AC

## 2017-05-25 MED ORDER — BISACODYL 5 MG PO TBEC: 5.0000 mg | DELAYED_RELEASE_TABLET | Freq: Every day | ORAL | 0 refills | Status: AC | PRN

## 2017-05-25 MED ORDER — BENZONATATE 100 MG PO CAPS: 100.0000 mg | ORAL_CAPSULE | Freq: Three times a day (TID) | ORAL | 0 refills | Status: AC

## 2017-05-25 MED ORDER — HYDROCERIN EX CREA: 30.0000 "application " | TOPICAL_CREAM | Freq: Two times a day (BID) | CUTANEOUS | 0 refills | Status: AC

## 2017-05-25 MED ORDER — INSULIN ASPART 100 UNIT/ML ~~LOC~~ SOLN: 0.0000 [IU] | Freq: Three times a day (TID) | SUBCUTANEOUS | 11 refills | Status: AC

## 2017-05-25 MED ORDER — GUAIFENESIN-DM 100-10 MG/5ML PO SYRP: 5.0000 mL | ORAL_SOLUTION | Freq: Three times a day (TID) | ORAL | 0 refills | Status: AC

## 2017-05-25 MED ORDER — ZINC OXIDE 40 % EX OINT: TOPICAL_OINTMENT | Freq: Three times a day (TID) | CUTANEOUS | 0 refills | Status: AC

## 2017-05-25 MED ORDER — IPRATROPIUM-ALBUTEROL 0.5-2.5 (3) MG/3ML IN SOLN: 3.0000 mL | RESPIRATORY_TRACT | 0 refills | Status: AC | PRN

## 2017-05-25 NOTE — Progress Notes (Signed)
Pt discharged to [facility]. Called report to nurse

## 2017-05-25 NOTE — Clinical Social Work Placement (Signed)
Patient received and accepted bed offer at [facility] SNF. [facility] contacted, patient's guardian notified. Facility aware of patient's discharge and confirmed patient's bed offer. Patient's RN can call report ot [phone], packet complete. CSW signing off, no other needs identified at this time.  CLINICAL SOCIAL WORK PLACEMENT  NOTE  Date:  05/25/2017  Patient Details  Name: [name] MRN: [id number] Date of Birth: 08/23/1951  Clinical Social Work is seeking post-discharge placement for this patient at the Skilled  Nursing Facility level of care (*CSW will initial, date and re-position this form in  chart as items are completed):  Yes   Patient/family provided with [place] Clinical Social Work Department's list of facilities offering this level of care within the geographic area requested by the patient (or if unable, by the patient's family).  Yes   Patient/family informed of their freedom to choose among providers that offer the needed level of care, that participate in Medicare, Medicaid or managed care program needed by the patient, have an available bed and are willing to accept the patient.  Yes   Patient/family informed of [facility]'s ownership interest in [facility] and [facility], as well as of the fact that they are under no obligation to receive care at these facilities.  PASRR submitted to EDS on       PASRR number received on       Existing PASRR number confirmed on 05/05/17     FL2 transmitted to all facilities in geographic area requested by pt/family on 05/05/17     FL2 transmitted to all facilities within larger geographic area on       Patient informed that his/her managed care company has contracts with or will negotiate with certain facilities, including the following:        Yes   Patient/family informed of bed offers received.  Patient chooses bed at [facility]     Physician recommends and patient chooses bed at       Patient to be transferred to [facility] on 05/25/17.  Patient to be transferred to facility by PTAR     Patient family notified on 05/25/17 of transfer.  Name of family member notified:  [name]     PHYSICIAN       Additional Comment:    _______________________________________________ Antionette Poles, LCSW 05/25/2017, 12:23 PM

## 2018-03-14 DIAGNOSIS — B965 Pseudomonas (aeruginosa) (mallei) (pseudomallei) as the cause of diseases classified elsewhere: Secondary | ICD-10-CM | POA: Diagnosis not present

## 2018-03-14 DIAGNOSIS — J9620 Acute and chronic respiratory failure, unspecified whether with hypoxia or hypercapnia: Secondary | ICD-10-CM | POA: Diagnosis not present

## 2018-03-14 DIAGNOSIS — G4733 Obstructive sleep apnea (adult) (pediatric): Secondary | ICD-10-CM

## 2018-03-14 DIAGNOSIS — A419 Sepsis, unspecified organism: Secondary | ICD-10-CM

## 2018-03-15 DIAGNOSIS — A419 Sepsis, unspecified organism: Secondary | ICD-10-CM

## 2018-03-15 DIAGNOSIS — G4733 Obstructive sleep apnea (adult) (pediatric): Secondary | ICD-10-CM

## 2018-03-15 DIAGNOSIS — J9620 Acute and chronic respiratory failure, unspecified whether with hypoxia or hypercapnia: Secondary | ICD-10-CM | POA: Diagnosis not present

## 2018-03-15 DIAGNOSIS — B965 Pseudomonas (aeruginosa) (mallei) (pseudomallei) as the cause of diseases classified elsewhere: Secondary | ICD-10-CM | POA: Diagnosis not present

## 2018-03-16 DIAGNOSIS — A419 Sepsis, unspecified organism: Secondary | ICD-10-CM

## 2018-03-16 DIAGNOSIS — G4733 Obstructive sleep apnea (adult) (pediatric): Secondary | ICD-10-CM | POA: Diagnosis not present

## 2018-03-16 DIAGNOSIS — B965 Pseudomonas (aeruginosa) (mallei) (pseudomallei) as the cause of diseases classified elsewhere: Secondary | ICD-10-CM

## 2018-03-16 DIAGNOSIS — J9620 Acute and chronic respiratory failure, unspecified whether with hypoxia or hypercapnia: Secondary | ICD-10-CM

## 2018-03-17 DIAGNOSIS — G4733 Obstructive sleep apnea (adult) (pediatric): Secondary | ICD-10-CM | POA: Diagnosis not present

## 2018-03-17 DIAGNOSIS — A419 Sepsis, unspecified organism: Secondary | ICD-10-CM | POA: Diagnosis not present

## 2018-03-17 DIAGNOSIS — B965 Pseudomonas (aeruginosa) (mallei) (pseudomallei) as the cause of diseases classified elsewhere: Secondary | ICD-10-CM | POA: Diagnosis not present

## 2018-03-17 DIAGNOSIS — J9620 Acute and chronic respiratory failure, unspecified whether with hypoxia or hypercapnia: Secondary | ICD-10-CM | POA: Diagnosis not present

## 2018-03-18 DIAGNOSIS — J9620 Acute and chronic respiratory failure, unspecified whether with hypoxia or hypercapnia: Secondary | ICD-10-CM

## 2018-03-18 DIAGNOSIS — G4733 Obstructive sleep apnea (adult) (pediatric): Secondary | ICD-10-CM

## 2018-03-18 DIAGNOSIS — A419 Sepsis, unspecified organism: Secondary | ICD-10-CM | POA: Diagnosis not present

## 2018-03-18 DIAGNOSIS — B965 Pseudomonas (aeruginosa) (mallei) (pseudomallei) as the cause of diseases classified elsewhere: Secondary | ICD-10-CM | POA: Diagnosis not present

## 2018-03-19 DIAGNOSIS — J9620 Acute and chronic respiratory failure, unspecified whether with hypoxia or hypercapnia: Secondary | ICD-10-CM

## 2018-03-19 DIAGNOSIS — G4733 Obstructive sleep apnea (adult) (pediatric): Secondary | ICD-10-CM

## 2018-03-19 DIAGNOSIS — A419 Sepsis, unspecified organism: Secondary | ICD-10-CM | POA: Diagnosis not present

## 2018-03-19 DIAGNOSIS — B965 Pseudomonas (aeruginosa) (mallei) (pseudomallei) as the cause of diseases classified elsewhere: Secondary | ICD-10-CM

## 2018-03-20 DIAGNOSIS — J9620 Acute and chronic respiratory failure, unspecified whether with hypoxia or hypercapnia: Secondary | ICD-10-CM

## 2018-03-20 DIAGNOSIS — B965 Pseudomonas (aeruginosa) (mallei) (pseudomallei) as the cause of diseases classified elsewhere: Secondary | ICD-10-CM

## 2018-03-20 DIAGNOSIS — G4733 Obstructive sleep apnea (adult) (pediatric): Secondary | ICD-10-CM

## 2018-03-20 DIAGNOSIS — A419 Sepsis, unspecified organism: Secondary | ICD-10-CM

## 2018-04-29 ENCOUNTER — Other Ambulatory Visit (HOSPITAL_COMMUNITY): Payer: Self-pay

## 2018-04-29 ENCOUNTER — Inpatient Hospital Stay
Admission: RE | Admit: 2018-04-29 | Discharge: 2018-06-07 | Disposition: E | Payer: Medicare Other | Source: Other Acute Inpatient Hospital | Attending: Internal Medicine | Admitting: Internal Medicine

## 2018-04-29 DIAGNOSIS — E870 Hyperosmolality and hypernatremia: Secondary | ICD-10-CM

## 2018-04-29 DIAGNOSIS — J189 Pneumonia, unspecified organism: Secondary | ICD-10-CM

## 2018-04-29 DIAGNOSIS — I5032 Chronic diastolic (congestive) heart failure: Secondary | ICD-10-CM | POA: Diagnosis present

## 2018-04-29 DIAGNOSIS — J9621 Acute and chronic respiratory failure with hypoxia: Secondary | ICD-10-CM

## 2018-04-29 DIAGNOSIS — Z930 Tracheostomy status: Secondary | ICD-10-CM

## 2018-04-29 DIAGNOSIS — N179 Acute kidney failure, unspecified: Secondary | ICD-10-CM | POA: Diagnosis present

## 2018-04-29 DIAGNOSIS — J9690 Respiratory failure, unspecified, unspecified whether with hypoxia or hypercapnia: Secondary | ICD-10-CM

## 2018-04-29 DIAGNOSIS — Z431 Encounter for attention to gastrostomy: Secondary | ICD-10-CM

## 2018-04-29 DIAGNOSIS — A419 Sepsis, unspecified organism: Secondary | ICD-10-CM

## 2018-04-29 DIAGNOSIS — R6520 Severe sepsis without septic shock: Secondary | ICD-10-CM

## 2018-04-29 HISTORY — DX: Sepsis, unspecified organism: A41.9

## 2018-04-29 HISTORY — DX: Hyperosmolality and hypernatremia: E87.0

## 2018-04-29 HISTORY — DX: Severe sepsis without septic shock: R65.20

## 2018-04-30 LAB — CBC
HEMATOCRIT: 28.5 % — AB (ref 39.0–52.0)
Hemoglobin: 8.6 g/dL — ABNORMAL LOW (ref 13.0–17.0)
MCH: 25.3 pg — ABNORMAL LOW (ref 26.0–34.0)
MCHC: 30.2 g/dL (ref 30.0–36.0)
MCV: 83.8 fL (ref 78.0–100.0)
PLATELETS: 92 10*3/uL — AB (ref 150–400)
RBC: 3.4 MIL/uL — AB (ref 4.22–5.81)
RDW: 22.7 % — ABNORMAL HIGH (ref 11.5–15.5)
WBC: 7.4 10*3/uL (ref 4.0–10.5)

## 2018-04-30 LAB — COMPREHENSIVE METABOLIC PANEL
ALT: 25 U/L (ref 0–44)
ANION GAP: 10 (ref 5–15)
AST: 53 U/L — ABNORMAL HIGH (ref 15–41)
Albumin: 1.5 g/dL — ABNORMAL LOW (ref 3.5–5.0)
Alkaline Phosphatase: 1747 U/L — ABNORMAL HIGH (ref 38–126)
BUN: 80 mg/dL — ABNORMAL HIGH (ref 8–23)
CHLORIDE: 122 mmol/L — AB (ref 98–111)
CO2: 18 mmol/L — ABNORMAL LOW (ref 22–32)
Calcium: 7.8 mg/dL — ABNORMAL LOW (ref 8.9–10.3)
Creatinine, Ser: 2.5 mg/dL — ABNORMAL HIGH (ref 0.61–1.24)
GFR calc non Af Amer: 26 mL/min — ABNORMAL LOW (ref >60)
GFR, EST AFRICAN AMERICAN: 30 mL/min — AB (ref >60)
Glucose, Bld: 142 mg/dL — ABNORMAL HIGH (ref 70–99)
POTASSIUM: 4.1 mmol/L (ref 3.5–5.1)
Sodium: 150 mmol/L — ABNORMAL HIGH (ref 135–145)
Total Bilirubin: 0.7 mg/dL (ref 0.3–1.2)
Total Protein: 6.1 g/dL — ABNORMAL LOW (ref 6.5–8.1)

## 2018-04-30 LAB — C DIFFICILE QUICK SCREEN W PCR REFLEX
C DIFFICILE (CDIFF) INTERP: NOT DETECTED
C Diff antigen: NEGATIVE
C Diff toxin: NEGATIVE

## 2018-05-02 DIAGNOSIS — E870 Hyperosmolality and hypernatremia: Secondary | ICD-10-CM | POA: Diagnosis not present

## 2018-05-02 DIAGNOSIS — N179 Acute kidney failure, unspecified: Secondary | ICD-10-CM | POA: Diagnosis not present

## 2018-05-02 DIAGNOSIS — I5032 Chronic diastolic (congestive) heart failure: Secondary | ICD-10-CM | POA: Diagnosis not present

## 2018-05-02 DIAGNOSIS — J9621 Acute and chronic respiratory failure with hypoxia: Secondary | ICD-10-CM | POA: Diagnosis not present

## 2018-05-02 MED ORDER — FAMOTIDINE 20 MG/2ML IV SOLN
20.00 | INTRAVENOUS | Status: DC
Start: ? — End: 2018-05-02

## 2018-05-02 MED ORDER — GENERIC EXTERNAL MEDICATION
2.25 g | Status: DC
Start: 2018-04-29 — End: 2018-05-02

## 2018-05-02 MED ORDER — ONDANSETRON HCL 4 MG/2ML IJ SOLN
4.00 | INTRAMUSCULAR | Status: DC
Start: ? — End: 2018-05-02

## 2018-05-02 MED ORDER — GENERIC EXTERNAL MEDICATION
Status: DC
Start: ? — End: 2018-05-02

## 2018-05-02 MED ORDER — ACETAMINOPHEN 650 MG RE SUPP
650.00 | RECTAL | Status: DC
Start: ? — End: 2018-05-02

## 2018-05-02 MED ORDER — DEXTROSE 5 % IV SOLN
INTRAVENOUS | Status: DC
Start: ? — End: 2018-05-02

## 2018-05-02 MED ORDER — GENERIC EXTERNAL MEDICATION
Status: DC
Start: 2018-04-29 — End: 2018-05-02

## 2018-05-02 NOTE — Consult Note (Signed)
Pulmonary Critical Care Medicine [facility] [place]  PULMONARY SERVICE  Date of Service: 05/02/2018  PULMONARY CRITICAL CARE Nicholas TEUSCHER [name]  ZOX:[id number]  DOB: Jan 30, 1951   DOA: 04/28/2018  Referring Physician: Carron Curie, MD  HPI: [name] is a 67 y.o. male seen for follow up of Acute on Chronic Respiratory Failure.  Patient was multiple medical problems including morbid obesity obstructive sleep apnea popping syndrome COPD chronic kidney disease congestive heart failure stroke who had been in the hospital and discharged on the ninth after 2 weeks for an acute stroke sepsis hyponatremia and acute renal failure.  Patient was at the nursing home and was found to be unresponsive.  Patient was admitted to the acute care facility at that time was tachycardic and had increased work of breathing noted.  Patient was unresponsive however this did improve after aggressive hydration.  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  In addition chest films had shown patchy bibasilar airspace disease with air bronchograms consistent with pneumonia.  Patient was felt to be septic and also was noted to be hypernatremic.  Treated aggressively for sepsis with IV antibiotics.  His renal function had also been noted to be elevated which did improve upon discharge.  He now presents to our facility for further management and weaning.  Currently is on T collar and is requiring 28% FiO2  Review of Systems:  ROS performed and is unremarkable other than noted above.  Past Medical History:  Diagnosis Date  . Arthritis   . CHF (congestive heart failure) (HCC)   . COPD (chronic obstructive pulmonary disease) (HCC)   . Diabetes (HCC)   . Hyperlipidemia   . Hypertension   . Morbid obesity (HCC)   . Multiple abrasions    rt arm  . RCT (rotator cuff tear)     Past Surgical History:  Procedure Laterality Date  . 81191    . NO PAST SURGERIES    . SHOULDER  ARTHROSCOPY WITH SUBACROMIAL DECOMPRESSION, ROTATOR CUFF REPAIR AND BICEP TENDON REPAIR Right 12/28/2014   Procedure: RIGHT SHOULDER ARTHROSCOPY WITH SUBACROMIAL DECOMPRESSION, DISTAL CLAVICLE RESECTION, ROTATOR CUFF REPAIR ;  Surgeon: Eugenia Mcalpine, MD;  Location: [facility] ORS;  Service: Orthopedics;  Laterality: Right;  . TRACHEOSTOMY REVISION N/A 01/18/2015   Procedure: TRACHEOSTOMY REVISION;  Surgeon: Melvenia Beam, MD;  Location: [facility] OR;  Service: ENT;  Laterality: N/A;  . TRACHEOSTOMY TUBE PLACEMENT N/A 01/16/2015   Procedure: TRACHEOSTOMY;  Surgeon: Flo Shanks, MD;  Location: [facility] OR;  Service: ENT;  Laterality: N/A;  . TRACHEOSTOMY TUBE PLACEMENT N/A 01/22/2015   Procedure: TRACHEOSTOMY;  Surgeon: Flo Shanks, MD;  Location: [facility] OR;  Service: ENT;  Laterality: N/A;    Social History:    reports that he has quit smoking. His smoking use included cigarettes. He smoked 0.20 packs per day. He has quit using smokeless tobacco. He reports that he does not drink alcohol or use drugs.  Family History: Non-Contributory to the present illness  Allergies reviewed  Medications: Reviewed on Rounds  Physical Exam:  Vitals: Temperature 100.2 pulse 86 respiratory rate 27 blood pressure 106/61 saturations 95%  Ventilator Settings currently is off the ventilator on T collar FiO2 28%  . General: Comfortable at this time . Eyes: Grossly normal lids, irises & conjunctiva . ENT: grossly tongue is normal . Neck: no obvious mass . Cardiovascular: S1-S2 normal no gallop or rub . Respiratory: Coarse breath sounds no rhonchi . Abdomen: Soft nontender .  Skin: no rash seen on limited exam . Musculoskeletal: not rigid . Psychiatric:unable to assess . Neurologic: no seizure no involuntary movements         Labs on Admission:  Basic Metabolic Panel: Recent Labs  Lab 04/30/18 0521  NA 150*  K 4.1  CL 122*  CO2 18*  GLUCOSE 142*  BUN 80*  CREATININE 2.50*  CALCIUM 7.8*    Liver Function  Tests: Recent Labs  Lab 04/30/18 0521  AST 53*  ALT 25  ALKPHOS 1,747*  BILITOT 0.7  PROT 6.1*  ALBUMIN 1.5*   No results for input(s): LIPASE, AMYLASE in the last 168 hours. No results for input(s): AMMONIA in the last 168 hours.  CBC: Recent Labs  Lab 04/30/18 0521  WBC 7.4  HGB 8.6*  HCT 28.5*  MCV 83.8  PLT 92*    Cardiac Enzymes: No results for input(s): CKTOTAL, CKMB, CKMBINDEX, TROPONINI in the last 168 hours.  BNP (last 3 results) No results for input(s): BNP in the last 8760 hours.  ProBNP (last 3 results) No results for input(s): PROBNP in the last 8760 hours.   Radiological Exams on Admission: Dg Chest Port 1 View  Result Date: 2017-10-25 CLINICAL DATA:  Respiratory failure EXAM: PORTABLE CHEST 1 VIEW COMPARISON:  04/26/2018 FINDINGS: Patient is slightly rotated on this study. Allowing for this, there is stable cardiomegaly with slight uncoiling of the thoracic aorta. Tracheostomy tube tip is centered over the trachea within the superior mediastinum. Redemonstration of bilateral diffuse interstitial lung markings with slightly more confluent airspace opacities at the left lung base. Differential considerations remain the same, atelectasis versus left lower lobe small focus of pneumonia or combination thereof. No acute osseous abnormality. The included gastric air bubble is moderately distended. No free air beneath the diaphragm. IMPRESSION: 1. Cardiomegaly with aortic atherosclerosis. 2. Interstitial prominence suspicious for mild interstitial edema. 3. Left basilar airspace opacities may reflect atelectasis, pneumonia or combination thereof. No significant change. Electronically Signed   By: Tollie Ethavid  Kwon M.D.   On: 02019-02-18 19:48   Dg Abd Portable 1v  Result Date: 2017-10-25 CLINICAL DATA:  Peg tube placement EXAM: PORTABLE ABDOMEN - 1 VIEW COMPARISON:  None. FINDINGS: The tip of a percutaneous gastrostomy tube projects over the gastric antrum. Gastric  distention with air is noted. Intraluminal contrast is seen within the stomach without extravasation. A total of 50 cc of Isovue-300 was administered via the tube by technologist report. IVC catheter is noted in the right hemiabdomen. IMPRESSION: Peg tube tip in the gastric antrum. Contrast and air is seen within the stomach without extravasation. Electronically Signed   By: Tollie Ethavid  Kwon M.D.   On: 02019-02-18 19:49    Assessment/Plan Principal Problem:   Acute on chronic respiratory failure with hypoxia (HCC) Active Problems:   AKI (acute kidney injury) (HCC)   Chronic diastolic congestive heart failure (HCC)   Tracheostomy dependence (HCC)   Severe sepsis (HCC)   Hypernatremia   1. Acute on chronic respiratory failure with hypoxia patient is weaning right now on T collar secretions are still copious.  We will continue with aggressive pulmonary toilet supportive care.  Titrate oxygen.  Will look at possibly starting capping after discussion with the treatment team 2. Acute renal failure the renal function does appear to be doing a little bit better his creatinine is now 2.5 reportedly he was under 2 at the other facility on discharge.  Need to continue to monitor his fluid status closely. 3. Hypernatremia his sodium is  still elevated needs ongoing fluid resuscitation we will continue with supportive care. 4. Chronic diastolic heart failure we will continue with present management follow-up x-ray as necessary. 5. Tracheostomy dependent he has copious secretions noted also will continue with supportive care. 6. Sepsis treated with triple antibiotics on initial presentation Zyvox cefepime and Zithromax.  Right now is hemodynamically stable we will continue with present therapy  I have personally seen and evaluated the patient, evaluated laboratory and imaging results, formulated the assessment and plan and placed orders. The Patient requires high complexity decision making for assessment and  support.  Case was discussed on Rounds with the Respiratory Therapy Staff Time Spent  Yevonne Pax, MD [facility] Pulmonary Critical Care Medicine Sleep Medicine

## 2018-05-03 ENCOUNTER — Encounter: Payer: Self-pay | Admitting: Internal Medicine

## 2018-05-03 DIAGNOSIS — N179 Acute kidney failure, unspecified: Secondary | ICD-10-CM

## 2018-05-03 DIAGNOSIS — A419 Sepsis, unspecified organism: Secondary | ICD-10-CM

## 2018-05-03 DIAGNOSIS — J9621 Acute and chronic respiratory failure with hypoxia: Secondary | ICD-10-CM | POA: Diagnosis not present

## 2018-05-03 DIAGNOSIS — E870 Hyperosmolality and hypernatremia: Secondary | ICD-10-CM | POA: Diagnosis not present

## 2018-05-03 DIAGNOSIS — I5032 Chronic diastolic (congestive) heart failure: Secondary | ICD-10-CM | POA: Diagnosis not present

## 2018-05-03 DIAGNOSIS — R6520 Severe sepsis without septic shock: Secondary | ICD-10-CM

## 2018-05-03 DIAGNOSIS — Z930 Tracheostomy status: Secondary | ICD-10-CM

## 2018-05-03 HISTORY — DX: Hyperosmolality and hypernatremia: E87.0

## 2018-05-03 HISTORY — DX: Sepsis, unspecified organism: A41.9

## 2018-05-03 LAB — CBC
HEMATOCRIT: 28.2 % — AB (ref 39.0–52.0)
Hemoglobin: 7.8 g/dL — ABNORMAL LOW (ref 13.0–17.0)
MCH: 24.9 pg — ABNORMAL LOW (ref 26.0–34.0)
MCHC: 27.7 g/dL — ABNORMAL LOW (ref 30.0–36.0)
MCV: 90.1 fL (ref 78.0–100.0)
Platelets: 117 10*3/uL — ABNORMAL LOW (ref 150–400)
RBC: 3.13 MIL/uL — ABNORMAL LOW (ref 4.22–5.81)
RDW: 24.6 % — ABNORMAL HIGH (ref 11.5–15.5)
WBC: 7.1 10*3/uL (ref 4.0–10.5)

## 2018-05-03 LAB — BASIC METABOLIC PANEL
ANION GAP: 11 (ref 5–15)
BUN: 85 mg/dL — ABNORMAL HIGH (ref 8–23)
CHLORIDE: 128 mmol/L — AB (ref 98–111)
CO2: 19 mmol/L — ABNORMAL LOW (ref 22–32)
Calcium: 8.1 mg/dL — ABNORMAL LOW (ref 8.9–10.3)
Creatinine, Ser: 2.53 mg/dL — ABNORMAL HIGH (ref 0.61–1.24)
GFR calc Af Amer: 29 mL/min — ABNORMAL LOW (ref >60)
GFR, EST NON AFRICAN AMERICAN: 25 mL/min — AB (ref >60)
GLUCOSE: 137 mg/dL — AB (ref 70–99)
POTASSIUM: 4.4 mmol/L (ref 3.5–5.1)
SODIUM: 158 mmol/L — AB (ref 135–145)

## 2018-05-03 NOTE — Progress Notes (Addendum)
[facility]   PULMONARY CRITICAL CARE SERVICE  PROGRESS NOTE  Date of Service: 05/03/2018  [name]  ZOX:[id number]  DOB: October 28, 1950   DOA: 04/15/2018  Referring Physician: Carron CurieAli Hijazi, MD  HPI: [name] is a 67 y.o. male seen for follow up of Acute on Chronic Respiratory Failure.  Remains on T collar reportedly copious secretions still noted.  Patient is requiring aggressive pulmonary toilet.  Medications: Reviewed on Rounds  Physical Exam:  Vitals: Temperature 98.0 pulse 84 respiratory rate 20 blood pressure is 128/53 saturations 98%  Ventilator Settings currently on T collar  . General: Comfortable at this time . Eyes: Grossly normal lids, irises & conjunctiva . ENT: grossly tongue is normal . Neck: no obvious mass . Cardiovascular: S1 S2 normal no gallop . Respiratory: No rhonchi or rales are noted . Abdomen: soft . Skin: no rash seen on limited exam . Musculoskeletal: not rigid . Psychiatric:unable to assess . Neurologic: no seizure no involuntary movements         Lab Data:   Basic Metabolic Panel: Recent Labs  Lab 04/30/18 0521 05/03/18 0517  NA 150* 158*  K 4.1 4.4  CL 122* 128*  CO2 18* 19*  GLUCOSE 142* 137*  BUN 80* 85*  CREATININE 2.50* 2.53*  CALCIUM 7.8* 8.1*    Liver Function Tests: Recent Labs  Lab 04/30/18 0521  AST 53*  ALT 25  ALKPHOS 1,747*  BILITOT 0.7  PROT 6.1*  ALBUMIN 1.5*   No results for input(s): LIPASE, AMYLASE in the last 168 hours. No results for input(s): AMMONIA in the last 168 hours.  CBC: Recent Labs  Lab 04/30/18 0521 05/03/18 0517  WBC 7.4 7.1  HGB 8.6* 7.8*  HCT 28.5* 28.2*  MCV 83.8 90.1  PLT 92* 117*    Cardiac Enzymes: No results for input(s): CKTOTAL, CKMB, CKMBINDEX, TROPONINI in the last 168 hours.  BNP (last 3 results) No results for input(s): BNP in the last 8760 hours.  ProBNP (last 3 results) No results for input(s): PROBNP  in the last 8760 hours.  Radiological Exams: No results found.  Assessment/Plan Principal Problem:   Acute on chronic respiratory failure with hypoxia (HCC) Active Problems:   AKI (acute kidney injury) (HCC)   Chronic diastolic congestive heart failure (HCC)   Tracheostomy dependence (HCC)   Severe sepsis (HCC)   Hypernatremia   1. Acute on chronic respiratory failure with hypoxia remains on interslice T collar secretions are still copious.  We will continue aggressive pulmonary toilet supportive care. 2. Acute renal failure follow his fluid status.  Today the creatinine is elevated as well as the sodium we need to monitor this very closely.  He is also somewhat acidotic discussed on rounds with the team and with primary care to adjust his fluid status. 3. Chronic diastolic heart failure we will continue with supportive care. 4. Severe sepsis clinically hemodynamically stable resolved we will follow 5. Hypernatremia still significantly elevated   I have personally seen and evaluated the patient, evaluated laboratory and imaging results, formulated the assessment and plan and placed orders. The Patient requires high complexity decision making for assessment and support.  Case was discussed on Rounds with the Respiratory Therapy Staff time spent 35 minutes review of the medical chart review notes as well as discussion with primary care team on rounds  Yevonne PaxSaadat A Mohamadou Maciver, MD [facility] Pulmonary Critical Care Medicine Sleep Medicine

## 2018-05-04 ENCOUNTER — Ambulatory Visit (HOSPITAL_COMMUNITY): Payer: No Typology Code available for payment source | Attending: Internal Medicine

## 2018-05-04 DIAGNOSIS — I739 Peripheral vascular disease, unspecified: Secondary | ICD-10-CM | POA: Diagnosis not present

## 2018-05-04 DIAGNOSIS — N179 Acute kidney failure, unspecified: Secondary | ICD-10-CM | POA: Diagnosis not present

## 2018-05-04 DIAGNOSIS — J9621 Acute and chronic respiratory failure with hypoxia: Secondary | ICD-10-CM | POA: Diagnosis not present

## 2018-05-04 DIAGNOSIS — E870 Hyperosmolality and hypernatremia: Secondary | ICD-10-CM | POA: Diagnosis not present

## 2018-05-04 DIAGNOSIS — I5032 Chronic diastolic (congestive) heart failure: Secondary | ICD-10-CM | POA: Diagnosis not present

## 2018-05-04 LAB — SODIUM: Sodium: 154 mmol/L — ABNORMAL HIGH (ref 135–145)

## 2018-05-04 NOTE — Progress Notes (Signed)
VASCULAR LAB PRELIMINARY  ARTERIAL  ABI completed:    RIGHT    LEFT    PRESSURE WAVEFORM  PRESSURE WAVEFORM  BRACHIAL 97 triphasic BRACHIAL 111 triphasic  DP 182 biphasic DP 79 biphasic         PT 255 triphasic PT 80 biphasic         GREAT TOE  NA GREAT TOE  NA    RIGHT LEFT  ABI 1.64 0.72   RT ABI indicates noncompressible right lower extremity arteries.  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    Nicholas RamsMegan E Ricky [name] 05/04/2018, 1:17 PM

## 2018-05-04 NOTE — Progress Notes (Signed)
[facility]   PULMONARY CRITICAL CARE SERVICE  PROGRESS NOTE  Date of Service: 05/04/2018  [name]  [id number]  DOB: 10-30-1950   DOA: 05/06/2018  Referring Physician: Carron CurieAli Hijazi, MD  HPI: [name] is a 67 y.o. male seen for follow up of Acute on Chronic Respiratory Failure.  Patient is on T collar very copious amounts of secretions limiting factor as far as being able to wean  Medications: Reviewed on Rounds  Physical Exam:  Vitals: Temperature 97.0 pulse 88 respiratory 22 blood pressure 92/48 saturation 95%  Ventilator Settings off the ventilator on T collar  . General: Comfortable at this time . Eyes: Grossly normal lids, irises & conjunctiva . ENT: grossly tongue is normal . Neck: no obvious mass . Cardiovascular: S1 S2 normal no gallop . Respiratory: Coarse breath sounds noted bilaterally . Abdomen: soft . Skin: no rash seen on limited exam . Musculoskeletal: not rigid . Psychiatric:unable to assess . Neurologic: no seizure no involuntary movements         Lab Data:   Basic Metabolic Panel: Recent Labs  Lab 04/30/18 0521 05/03/18 0517 05/04/18 0553  NA 150* 158* 154*  K 4.1 4.4  --   CL 122* 128*  --   CO2 18* 19*  --   GLUCOSE 142* 137*  --   BUN 80* 85*  --   CREATININE 2.50* 2.53*  --   CALCIUM 7.8* 8.1*  --     Liver Function Tests: Recent Labs  Lab 04/30/18 0521  AST 53*  ALT 25  ALKPHOS 1,747*  BILITOT 0.7  PROT 6.1*  ALBUMIN 1.5*   No results for input(s): LIPASE, AMYLASE in the last 168 hours. No results for input(s): AMMONIA in the last 168 hours.  CBC: Recent Labs  Lab 04/30/18 0521 05/03/18 0517  WBC 7.4 7.1  HGB 8.6* 7.8*  HCT 28.5* 28.2*  MCV 83.8 90.1  PLT 92* 117*    Cardiac Enzymes: No results for input(s): CKTOTAL, CKMB, CKMBINDEX, TROPONINI in the last 168 hours.  BNP (last 3 results) No results for input(s): BNP in the last 8760  hours.  ProBNP (last 3 results) No results for input(s): PROBNP in the last 8760 hours.  Radiological Exams: No results found.  Assessment/Plan Principal Problem:   Acute on chronic respiratory failure with hypoxia (HCC) Active Problems:   AKI (acute kidney injury) (HCC)   Chronic diastolic congestive heart failure (HCC)   Tracheostomy dependence (HCC)   Severe sepsis (HCC)   Hypernatremia   1. Acute on chronic respiratory failure with hypoxia we will continue with T collar weaning on 28% FiO2 secretions remain limiting factor as far as being able to decannulate. 2. Acute renal failure we will monitor the labs last creatinine was 2.53 slightly elevated 3. Chronic diastolic heart failure at baseline we will continue present management 4. Severe sepsis hemodynamically stable continue to follow 5. Hypernatremia slowly improving   I have personally seen and evaluated the patient, evaluated laboratory and imaging results, formulated the assessment and plan and placed orders. The Patient requires high complexity decision making for assessment and support.  Case was discussed on Rounds with the Respiratory Therapy Staff  Yevonne PaxSaadat A Khan, MD [facility] Pulmonary Critical Care Medicine Sleep Medicine

## 2018-05-05 ENCOUNTER — Other Ambulatory Visit (HOSPITAL_COMMUNITY): Payer: Self-pay

## 2018-05-05 DIAGNOSIS — N179 Acute kidney failure, unspecified: Secondary | ICD-10-CM | POA: Diagnosis not present

## 2018-05-05 DIAGNOSIS — I5032 Chronic diastolic (congestive) heart failure: Secondary | ICD-10-CM | POA: Diagnosis not present

## 2018-05-05 DIAGNOSIS — E870 Hyperosmolality and hypernatremia: Secondary | ICD-10-CM | POA: Diagnosis not present

## 2018-05-05 DIAGNOSIS — J9621 Acute and chronic respiratory failure with hypoxia: Secondary | ICD-10-CM | POA: Diagnosis not present

## 2018-05-05 LAB — BLOOD GAS, ARTERIAL
ACID-BASE DEFICIT: 9.9 mmol/L — AB (ref 0.0–2.0)
Acid-base deficit: 9 mmol/L — ABNORMAL HIGH (ref 0.0–2.0)
BICARBONATE: 18.5 mmol/L — AB (ref 20.0–28.0)
BICARBONATE: 17.4 mmol/L — AB (ref 20.0–28.0)
FIO2: 40
FIO2: 45
LHR: 18 {breaths}/min
O2 SAT: 90 %
O2 Saturation: 98.1 %
PATIENT TEMPERATURE: 98.6
PATIENT TEMPERATURE: 98.6
PCO2 ART: 45.3 mmHg (ref 32.0–48.0)
PEEP: 5 cmH2O
VT: 480 mL
pCO2 arterial: 64.7 mmHg — ABNORMAL HIGH (ref 32.0–48.0)
pH, Arterial: 7.084 — CL (ref 7.350–7.450)
pH, Arterial: 7.21 — ABNORMAL LOW (ref 7.350–7.450)
pO2, Arterial: 68.6 mmHg — ABNORMAL LOW (ref 83.0–108.0)
pO2, Arterial: 109 mmHg — ABNORMAL HIGH (ref 83.0–108.0)

## 2018-05-05 LAB — PREPARE RBC (CROSSMATCH)

## 2018-05-05 LAB — CBC WITH DIFFERENTIAL/PLATELET
BASOS ABS: 0.1 10*3/uL (ref 0.0–0.1)
BASOS PCT: 1 %
EOS ABS: 0.2 10*3/uL (ref 0.0–0.7)
Eosinophils Relative: 3 %
HCT: 24.5 % — ABNORMAL LOW (ref 39.0–52.0)
Hemoglobin: 6.6 g/dL — CL (ref 13.0–17.0)
LYMPHS ABS: 1.2 10*3/uL (ref 0.7–4.0)
LYMPHS PCT: 19 %
MCH: 25.1 pg — AB (ref 26.0–34.0)
MCHC: 26.9 g/dL — ABNORMAL LOW (ref 30.0–36.0)
MCV: 93.2 fL (ref 78.0–100.0)
MONO ABS: 0.3 10*3/uL (ref 0.1–1.0)
Monocytes Relative: 5 %
NEUTROS ABS: 4.7 10*3/uL (ref 1.7–7.7)
Neutrophils Relative %: 72 %
PLATELETS: 148 10*3/uL — AB (ref 150–400)
RBC: 2.63 MIL/uL — ABNORMAL LOW (ref 4.22–5.81)
RDW: 24.4 % — ABNORMAL HIGH (ref 11.5–15.5)
WBC: 6.5 10*3/uL (ref 4.0–10.5)

## 2018-05-05 LAB — BASIC METABOLIC PANEL
ANION GAP: 10 (ref 5–15)
BUN: 90 mg/dL — AB (ref 8–23)
CALCIUM: 7.9 mg/dL — AB (ref 8.9–10.3)
CO2: 18 mmol/L — AB (ref 22–32)
CREATININE: 2.84 mg/dL — AB (ref 0.61–1.24)
Chloride: 122 mmol/L — ABNORMAL HIGH (ref 98–111)
GFR calc Af Amer: 25 mL/min — ABNORMAL LOW (ref >60)
GFR, EST NON AFRICAN AMERICAN: 22 mL/min — AB (ref >60)
GLUCOSE: 108 mg/dL — AB (ref 70–99)
Potassium: 4.9 mmol/L (ref 3.5–5.1)
Sodium: 150 mmol/L — ABNORMAL HIGH (ref 135–145)

## 2018-05-05 LAB — LACTIC ACID, PLASMA: LACTIC ACID, VENOUS: 1.2 mmol/L (ref 0.5–1.9)

## 2018-05-05 LAB — ABO/RH: ABO/RH(D): O POS

## 2018-05-05 NOTE — Progress Notes (Signed)
[facility]   PULMONARY CRITICAL CARE SERVICE  PROGRESS NOTE  Date of Service: 05/05/2018  [name]  AVW:[id number]  DOB: 11-22-1950   DOA: 04/08/2018  Referring Physician: Carron CurieAli Hijazi, MD  HPI: [name] is a 67 y.o. male seen for follow up of Acute on Chronic Respiratory Failure.  Patient is on full support right now is on assist control mode patient has not been tolerating weaning.  Had a blood gas done yesterday which showed a pH of 7.08 patient was placed back on the ventilator and full support on assist control mode and currently is on 45% FiO2.  Medications: Reviewed on Rounds  Physical Exam:  Vitals: Temperature 98.0 pulse 76 respiratory rate 16 blood pressure is 86/43 saturations 99%  Ventilator Settings mode of ventilation assist control FiO2 45% tidal volume 480 PEEP 5  . General: Comfortable at this time . Eyes: Grossly normal lids, irises & conjunctiva . ENT: grossly tongue is normal . Neck: no obvious mass . Cardiovascular: S1 S2 normal no gallop . Respiratory: Coarse breath sounds are noted bilaterally . Abdomen: soft . Skin: no rash seen on limited exam . Musculoskeletal: not rigid . Psychiatric:unable to assess . Neurologic: no seizure no involuntary movements         Lab Data:   Basic Metabolic Panel: Recent Labs  Lab 04/30/18 0521 05/03/18 0517 05/04/18 0553 05/05/18 0956  NA 150* 158* 154* 150*  K 4.1 4.4  --  4.9  CL 122* 128*  --  122*  CO2 18* 19*  --  18*  GLUCOSE 142* 137*  --  108*  BUN 80* 85*  --  90*  CREATININE 2.50* 2.53*  --  2.84*  CALCIUM 7.8* 8.1*  --  7.9*    Liver Function Tests: Recent Labs  Lab 04/30/18 0521  AST 53*  ALT 25  ALKPHOS 1,747*  BILITOT 0.7  PROT 6.1*  ALBUMIN 1.5*   No results for input(s): LIPASE, AMYLASE in the last 168 hours. No results for input(s): AMMONIA in the last 168 hours.  CBC: Recent Labs  Lab 04/30/18 0521 05/03/18 0517  05/05/18 0956  WBC 7.4 7.1 6.5  NEUTROABS  --   --  4.7  HGB 8.6* 7.8* 6.6*  HCT 28.5* 28.2* 24.5*  MCV 83.8 90.1 93.2  PLT 92* 117* 148*    Cardiac Enzymes: No results for input(s): CKTOTAL, CKMB, CKMBINDEX, TROPONINI in the last 168 hours.  BNP (last 3 results) No results for input(s): BNP in the last 8760 hours.  ProBNP (last 3 results) No results for input(s): PROBNP in the last 8760 hours.  Radiological Exams: Dg Chest Port 1 View  Result Date: 05/05/2018 CLINICAL DATA:  Shortness of breath. EXAM: PORTABLE CHEST 1 VIEW COMPARISON:  Radiograph of April 29, 2018. FINDINGS: Stable cardiomegaly is noted. Tracheostomy tube is in grossly good position. No pneumothorax is noted. Mild central pulmonary vascular congestion is noted. No consolidative process is noted in the right lung. Stable left basilar opacity is noted concerning for atelectasis or infiltrate with possible associated pleural effusion. Bony thorax is unremarkable. IMPRESSION: Stable left basilar atelectasis or infiltrate is noted with probable associated pleural effusion. Stable cardiomegaly with central pulmonary vascular congestion. Electronically Signed   By: Lupita RaiderJames  Green Jr, M.D.   On: 05/05/2018 10:36    Assessment/Plan Principal Problem:   Acute on chronic respiratory failure with hypoxia (HCC) Active Problems:   AKI (acute kidney injury) (HCC)   Chronic diastolic  congestive heart failure (HCC)   Tracheostomy dependence (HCC)   Severe sepsis (HCC)   Hypernatremia   1. Acute on chronic respiratory failure with hypoxia we will continue with full vent support for now follow-up ABGs as necessary. 2. Acute renal failure we will continue with supportive care follow labs 3. Chronic diastolic heart failure last chest x-ray shows significant congestion and possible pleural effusion I reviewed the x-ray myself 4. Severe sepsis hemodynamically is having issues with lower blood pressure.  Patient is being resuscitated  might require pressors 5. Hypernatremia sodium still has been elevated but is trending down now   I have personally seen and evaluated the patient, evaluated laboratory and imaging results, formulated the assessment and plan and placed orders. The Patient requires high complexity decision making for assessment and support.  Case was discussed on Rounds with the Respiratory Therapy Staff time 35 minutes patient is critically ill in danger of cardiac arrest with hypotension requiring advanced monitoring  Yevonne Pax, MD [facility] Pulmonary Critical Care Medicine Sleep Medicine

## 2018-05-06 DIAGNOSIS — J9621 Acute and chronic respiratory failure with hypoxia: Secondary | ICD-10-CM | POA: Diagnosis not present

## 2018-05-06 DIAGNOSIS — I5032 Chronic diastolic (congestive) heart failure: Secondary | ICD-10-CM | POA: Diagnosis not present

## 2018-05-06 DIAGNOSIS — N179 Acute kidney failure, unspecified: Secondary | ICD-10-CM | POA: Diagnosis not present

## 2018-05-06 DIAGNOSIS — E870 Hyperosmolality and hypernatremia: Secondary | ICD-10-CM | POA: Diagnosis not present

## 2018-05-06 LAB — BLOOD GAS, ARTERIAL
ACID-BASE DEFICIT: 7.4 mmol/L — AB (ref 0.0–2.0)
BICARBONATE: 18.3 mmol/L — AB (ref 20.0–28.0)
FIO2: 40
LHR: 18 {breaths}/min
MECHVT: 480 mL
O2 Saturation: 99.2 %
PATIENT TEMPERATURE: 97.7
PCO2 ART: 40.6 mmHg (ref 32.0–48.0)
PEEP/CPAP: 5 cmH2O
PH ART: 7.273 — AB (ref 7.350–7.450)
PO2 ART: 163 mmHg — AB (ref 83.0–108.0)

## 2018-05-06 LAB — BASIC METABOLIC PANEL
ANION GAP: 10 (ref 5–15)
BUN: 96 mg/dL — ABNORMAL HIGH (ref 8–23)
CO2: 19 mmol/L — ABNORMAL LOW (ref 22–32)
Calcium: 7.6 mg/dL — ABNORMAL LOW (ref 8.9–10.3)
Chloride: 121 mmol/L — ABNORMAL HIGH (ref 98–111)
Creatinine, Ser: 3 mg/dL — ABNORMAL HIGH (ref 0.61–1.24)
GFR calc Af Amer: 24 mL/min — ABNORMAL LOW (ref >60)
GFR, EST NON AFRICAN AMERICAN: 20 mL/min — AB (ref >60)
GLUCOSE: 183 mg/dL — AB (ref 70–99)
POTASSIUM: 4.4 mmol/L (ref 3.5–5.1)
Sodium: 150 mmol/L — ABNORMAL HIGH (ref 135–145)

## 2018-05-06 LAB — TYPE AND SCREEN
ABO/RH(D): O POS
Antibody Screen: NEGATIVE
Unit division: 0

## 2018-05-06 LAB — CBC
HCT: 27.6 % — ABNORMAL LOW (ref 39.0–52.0)
Hemoglobin: 8 g/dL — ABNORMAL LOW (ref 13.0–17.0)
MCH: 25.8 pg — ABNORMAL LOW (ref 26.0–34.0)
MCHC: 29 g/dL — AB (ref 30.0–36.0)
MCV: 89 fL (ref 78.0–100.0)
Platelets: 202 10*3/uL (ref 150–400)
RBC: 3.1 MIL/uL — ABNORMAL LOW (ref 4.22–5.81)
RDW: 22.8 % — ABNORMAL HIGH (ref 11.5–15.5)
WBC: 7.8 10*3/uL (ref 4.0–10.5)

## 2018-05-06 LAB — BPAM RBC
Blood Product Expiration Date: 201909262359
ISSUE DATE / TIME: 201908292158
UNIT TYPE AND RH: 5100

## 2018-05-06 NOTE — Progress Notes (Signed)
[facility]   PULMONARY CRITICAL CARE SERVICE  PROGRESS NOTE  Date of Service: 05/06/2018  [name]  ZOX:[id number]  DOB: 30-Oct-1950   DOA: 04/08/2018  Referring Physician: Carron CurieAli Hijazi, MD  HPI: [name] is a 67 y.o. male seen for follow up of Acute on Chronic Respiratory Failure.  Patient right now is critically ill was on levo fed has been on 15mcg for drop in blood pressure overnight.  Has been having difficulty with the hypotension since overnight.  The patient also had a blood gas significantly acidotic.  Was placed back on the ventilator right now is on full support and assist control mode.  Patient's been on 45% FiO2 this likely reflects sepsis  Medications: Reviewed on Rounds  Physical Exam:  Vitals: Temperature 97.7 pulse 69 respiratory rate 17 blood pressure 100/53 saturations 99%  Ventilator Settings mode of ventilation assist control FiO2 45% tidal volume 373 PEEP 5  . General: Comfortable at this time . Eyes: Grossly normal lids, irises & conjunctiva . ENT: grossly tongue is normal . Neck: no obvious mass . Cardiovascular: S1 S2 normal no gallop . Respiratory: Coarse rhonchi noted bilaterally . Abdomen: soft . Skin: no rash seen on limited exam . Musculoskeletal: not rigid . Psychiatric:unable to assess . Neurologic: no seizure no involuntary movements         Lab Data:   Basic Metabolic Panel: Recent Labs  Lab 04/30/18 0521 05/03/18 0517 05/04/18 0553 05/05/18 0956 05/06/18 0525  NA 150* 158* 154* 150* 150*  K 4.1 4.4  --  4.9 4.4  CL 122* 128*  --  122* 121*  CO2 18* 19*  --  18* 19*  GLUCOSE 142* 137*  --  108* 183*  BUN 80* 85*  --  90* 96*  CREATININE 2.50* 2.53*  --  2.84* 3.00*  CALCIUM 7.8* 8.1*  --  7.9* 7.6*    Liver Function Tests: Recent Labs  Lab 04/30/18 0521  AST 53*  ALT 25  ALKPHOS 1,747*  BILITOT 0.7  PROT 6.1*  ALBUMIN 1.5*   No results for input(s): LIPASE,  AMYLASE in the last 168 hours. No results for input(s): AMMONIA in the last 168 hours.  CBC: Recent Labs  Lab 04/30/18 0521 05/03/18 0517 05/05/18 0956 05/06/18 0525  WBC 7.4 7.1 6.5 7.8  NEUTROABS  --   --  4.7  --   HGB 8.6* 7.8* 6.6* 8.0*  HCT 28.5* 28.2* 24.5* 27.6*  MCV 83.8 90.1 93.2 89.0  PLT 92* 117* 148* 202    Cardiac Enzymes: No results for input(s): CKTOTAL, CKMB, CKMBINDEX, TROPONINI in the last 168 hours.  BNP (last 3 results) No results for input(s): BNP in the last 8760 hours.  ProBNP (last 3 results) No results for input(s): PROBNP in the last 8760 hours.  Radiological Exams: Dg Chest Port 1 View  Result Date: 05/05/2018 CLINICAL DATA:  Recent insertion of central venous line EXAM: PORTABLE CHEST 1 VIEW COMPARISON:  Portable chest x-ray of 05/05/2017 FINDINGS: The right-sided central venous line has been inserted with the tip overlying the mid SVC. No pneumothorax is seen. Abnormal opacity at the left lung base remains consistent with pneumonia and possibly small left pleural effusion. Cardiomegaly is stable. The tip of the endotracheal to is approximately 5.2 cm above the carina. IMPRESSION: 1. New right PICC line tip overlies the mid SVC.  No pneumothorax. 2. Endotracheal tube tip approximately 5.2 cm above the carina. 3. No change in  left basilar opacity consistent with pneumonia, atelectasis, and probable small left effusion. Electronically Signed   By: Dwyane Dee M.D.   On: 05/05/2018 16:21   Dg Chest Port 1 View  Result Date: 05/05/2018 CLINICAL DATA:  Shortness of breath. EXAM: PORTABLE CHEST 1 VIEW COMPARISON:  Radiograph of 23-May-2018. FINDINGS: Stable cardiomegaly is noted. Tracheostomy tube is in grossly good position. No pneumothorax is noted. Mild central pulmonary vascular congestion is noted. No consolidative process is noted in the right lung. Stable left basilar opacity is noted concerning for atelectasis or infiltrate with possible associated  pleural effusion. Bony thorax is unremarkable. IMPRESSION: Stable left basilar atelectasis or infiltrate is noted with probable associated pleural effusion. Stable cardiomegaly with central pulmonary vascular congestion. Electronically Signed   By: Lupita Raider, M.D.   On: 05/05/2018 10:36    Assessment/Plan Principal Problem:   Acute on chronic respiratory failure with hypoxia (HCC) Active Problems:   AKI (acute kidney injury) (HCC)   Chronic diastolic congestive heart failure (HCC)   Tracheostomy dependence (HCC)   Severe sepsis (HCC)   Hypernatremia   1. Acute on chronic respiratory failure with hypoxia patient will be continued on full vent support at this time titrate the oxygen as tolerated. 2. Severe sepsis with shock hemodynamically right now is not stable patient is requiring Levophed at 15 mcg drip.  Monitor pressures monitor heart rate. 3. Acute renal failure we will titrate fluids monitor renal function. 4. Chronic diastolic heart failure follow-up x-ray showing some infiltrate still along with pulmonary vascular congestion however not able to diurese at this time because of blood pressure issues. 5. Hypernatremia last sodium is still at 150 continue with fluids as tolerated.   I have personally seen and evaluated the patient, evaluated laboratory and imaging results, formulated the assessment and plan and placed orders. The Patient requires high complexity decision making for assessment and support.  Case was discussed on Rounds with the Respiratory Therapy Staff time 35 minutes patient is critically ill in danger of cardiac arrest requiring advanced pressors and monitoring  Yevonne Pax, MD [facility] Pulmonary Critical Care Medicine Sleep Medicine

## 2018-05-07 DIAGNOSIS — J9621 Acute and chronic respiratory failure with hypoxia: Secondary | ICD-10-CM | POA: Diagnosis not present

## 2018-05-07 DIAGNOSIS — N179 Acute kidney failure, unspecified: Secondary | ICD-10-CM | POA: Diagnosis not present

## 2018-05-07 DIAGNOSIS — E870 Hyperosmolality and hypernatremia: Secondary | ICD-10-CM | POA: Diagnosis not present

## 2018-05-07 DIAGNOSIS — I5032 Chronic diastolic (congestive) heart failure: Secondary | ICD-10-CM | POA: Diagnosis not present

## 2018-05-07 LAB — BLOOD GAS, ARTERIAL
Acid-base deficit: 4.4 mmol/L — ABNORMAL HIGH (ref 0.0–2.0)
BICARBONATE: 20.5 mmol/L (ref 20.0–28.0)
FIO2: 40
O2 Saturation: 99.2 %
PCO2 ART: 40.4 mmHg (ref 32.0–48.0)
PEEP: 5 cmH2O
Patient temperature: 98.6
RATE: 20 resp/min
VT: 480 mL
pH, Arterial: 7.327 — ABNORMAL LOW (ref 7.350–7.450)
pO2, Arterial: 147 mmHg — ABNORMAL HIGH (ref 83.0–108.0)

## 2018-05-07 LAB — BASIC METABOLIC PANEL
Anion gap: 9 (ref 5–15)
BUN: 92 mg/dL — AB (ref 8–23)
CALCIUM: 7.9 mg/dL — AB (ref 8.9–10.3)
CO2: 23 mmol/L (ref 22–32)
CREATININE: 2.99 mg/dL — AB (ref 0.61–1.24)
Chloride: 119 mmol/L — ABNORMAL HIGH (ref 98–111)
GFR calc non Af Amer: 20 mL/min — ABNORMAL LOW (ref >60)
GFR, EST AFRICAN AMERICAN: 24 mL/min — AB (ref >60)
Glucose, Bld: 158 mg/dL — ABNORMAL HIGH (ref 70–99)
Potassium: 3.7 mmol/L (ref 3.5–5.1)
SODIUM: 151 mmol/L — AB (ref 135–145)

## 2018-05-07 NOTE — Progress Notes (Signed)
Pulmonary Critical Care Medicine [facility] [place]   PULMONARY CRITICAL CARE SERVICE  PROGRESS NOTE  Date of Service: 05/07/2018  [name]  ZOX:[id number]  DOB: 02-Mar-1951   DOA: 04/13/2018  Referring Physician: Carron Curie, MD  HPI: [name] is a 67 y.o. male seen for follow up of Acute on Chronic Respiratory Failure.  Patient is still on ventilator and full support.  Chest x-ray still showing significant infiltrate.  Patient has been on 30% oxygen  Medications: Reviewed on Rounds  Physical Exam:  Vitals: Temperature 99.5 pulse 86 respiratory rate 22 blood pressure 127/65 saturations 99%  Ventilator Settings mode of ventilation assist control FiO2 30% tidal volume 480 PEEP 5  . General: Comfortable at this time . Eyes: Grossly normal lids, irises & conjunctiva . ENT: grossly tongue is normal . Neck: no obvious mass . Cardiovascular: S1 S2 normal no gallop . Respiratory: Coarse rhonchi are noted bilaterally . Abdomen: soft . Skin: no rash seen on limited exam . Musculoskeletal: not rigid . Psychiatric:unable to assess . Neurologic: no seizure no involuntary movements         Lab Data:   Basic Metabolic Panel: Recent Labs  Lab 05/03/18 0517 05/04/18 0553 05/05/18 0956 05/06/18 0525 05/07/18 0704  NA 158* 154* 150* 150* 151*  K 4.4  --  4.9 4.4 3.7  CL 128*  --  122* 121* 119*  CO2 19*  --  18* 19* 23  GLUCOSE 137*  --  108* 183* 158*  BUN 85*  --  90* 96* 92*  CREATININE 2.53*  --  2.84* 3.00* 2.99*  CALCIUM 8.1*  --  7.9* 7.6* 7.9*    ABG: Recent Labs  Lab 05/05/18 1140 05/05/18 1330 05/06/18 1226 05/07/18 1015  PHART 7.084* 7.210* 7.273* 7.327*  PCO2ART 64.7* 45.3 40.6 40.4  PO2ART 68.6* 109* 163* 147*  HCO3 18.5* 17.4* 18.3* 20.5  O2SAT 90.0 98.1 99.2 99.2    Liver Function Tests: No results for input(s): AST, ALT, ALKPHOS, BILITOT, PROT, ALBUMIN in the last 168 hours. No results for input(s): LIPASE, AMYLASE in the last  168 hours. No results for input(s): AMMONIA in the last 168 hours.  CBC: Recent Labs  Lab 05/03/18 0517 05/05/18 0956 05/06/18 0525  WBC 7.1 6.5 7.8  NEUTROABS  --  4.7  --   HGB 7.8* 6.6* 8.0*  HCT 28.2* 24.5* 27.6*  MCV 90.1 93.2 89.0  PLT 117* 148* 202    Cardiac Enzymes: No results for input(s): CKTOTAL, CKMB, CKMBINDEX, TROPONINI in the last 168 hours.  BNP (last 3 results) No results for input(s): BNP in the last 8760 hours.  ProBNP (last 3 results) No results for input(s): PROBNP in the last 8760 hours.  Radiological Exams: Dg Chest Port 1 View  Result Date: 05/05/2018 CLINICAL DATA:  Recent insertion of central venous line EXAM: PORTABLE CHEST 1 VIEW COMPARISON:  Portable chest x-ray of 05/05/2017 FINDINGS: The right-sided central venous line has been inserted with the tip overlying the mid SVC. No pneumothorax is seen. Abnormal opacity at the left lung base remains consistent with pneumonia and possibly small left pleural effusion. Cardiomegaly is stable. The tip of the endotracheal to is approximately 5.2 cm above the carina. IMPRESSION: 1. New right PICC line tip overlies the mid SVC.  No pneumothorax. 2. Endotracheal tube tip approximately 5.2 cm above the carina. 3. No change in left basilar opacity consistent with pneumonia, atelectasis, and probable small left effusion. Electronically Signed   By: Renae Fickle  Gery PrayBarry M.D.   On: 05/05/2018 16:21    Assessment/Plan Principal Problem:   Acute on chronic respiratory failure with hypoxia (HCC) Active Problems:   AKI (acute kidney injury) (HCC)   Chronic diastolic congestive heart failure (HCC)   Tracheostomy dependence (HCC)   Severe sepsis (HCC)   Hypernatremia   1. Acute on chronic respiratory failure with hypoxia at this time the mechanics are poor patient is not able to wean last ABG does show some improvement overall would consider with the Paules vent settings.  Reassess again tomorrow check the spontaneous  breathing index. 2. Acute renal failure patient is on full supportive care creatinine slightly better sodium is still elevated.  Patient still on the dry side. 3. Severe sepsis hemodynamically stable we will continue with supportive care pressors as needed. 4. Chronic diastolic heart failure continue with fluid management.  He is still on the dry side would benefit from a little bit more fluid 5. Hyponatremia as above sodium is still elevated still on the dry side.  Replete fluids as dictated   I have personally seen and evaluated the patient, evaluated laboratory and imaging results, formulated the assessment and plan and placed orders. The Patient requires high complexity decision making for assessment and support.  Case was discussed on Rounds with the Respiratory Therapy Staff time spent 35 minutes reviewing the chart discussion with primary care team  Yevonne PaxSaadat A Khan, MD [facility] Pulmonary Critical Care Medicine Sleep Medicine

## 2018-05-08 ENCOUNTER — Other Ambulatory Visit (HOSPITAL_COMMUNITY): Payer: Self-pay

## 2018-05-08 DIAGNOSIS — I5032 Chronic diastolic (congestive) heart failure: Secondary | ICD-10-CM | POA: Diagnosis not present

## 2018-05-08 DIAGNOSIS — E870 Hyperosmolality and hypernatremia: Secondary | ICD-10-CM | POA: Diagnosis not present

## 2018-05-08 DIAGNOSIS — N179 Acute kidney failure, unspecified: Secondary | ICD-10-CM | POA: Diagnosis not present

## 2018-05-08 DIAGNOSIS — J9621 Acute and chronic respiratory failure with hypoxia: Secondary | ICD-10-CM | POA: Diagnosis not present

## 2018-05-08 LAB — BASIC METABOLIC PANEL
Anion gap: 12 (ref 5–15)
BUN: 85 mg/dL — AB (ref 8–23)
CALCIUM: 7.8 mg/dL — AB (ref 8.9–10.3)
CO2: 23 mmol/L (ref 22–32)
CREATININE: 2.72 mg/dL — AB (ref 0.61–1.24)
Chloride: 113 mmol/L — ABNORMAL HIGH (ref 98–111)
GFR calc Af Amer: 27 mL/min — ABNORMAL LOW (ref >60)
GFR, EST NON AFRICAN AMERICAN: 23 mL/min — AB (ref >60)
Glucose, Bld: 187 mg/dL — ABNORMAL HIGH (ref 70–99)
Potassium: 3.4 mmol/L — ABNORMAL LOW (ref 3.5–5.1)
SODIUM: 148 mmol/L — AB (ref 135–145)

## 2018-05-08 LAB — CBC
HCT: 25.4 % — ABNORMAL LOW (ref 39.0–52.0)
Hemoglobin: 7.4 g/dL — ABNORMAL LOW (ref 13.0–17.0)
MCH: 25.5 pg — ABNORMAL LOW (ref 26.0–34.0)
MCHC: 29.1 g/dL — ABNORMAL LOW (ref 30.0–36.0)
MCV: 87.6 fL (ref 78.0–100.0)
PLATELETS: 195 10*3/uL (ref 150–400)
RBC: 2.9 MIL/uL — ABNORMAL LOW (ref 4.22–5.81)
RDW: 22.9 % — AB (ref 11.5–15.5)
WBC: 7.6 10*3/uL (ref 4.0–10.5)

## 2018-05-08 NOTE — Progress Notes (Signed)
Pulmonary Critical Care Medicine [facility] [place]   PULMONARY CRITICAL CARE SERVICE  PROGRESS NOTE  Date of Service: 05/08/2018  [name]  WUJ:[id number]  DOB: 1950/12/03   DOA: May 17, 2018  Referring Physician: Carron Curie, MD  HPI: [name] is a 67 y.o. male seen for follow up of Acute on Chronic Respiratory Failure.  Patient remains on full vent support currently is on assist control mode has been on 20% oxygen.  Chest x-ray had shown some increased probable edema  Medications: Reviewed on Rounds  Physical Exam:  Vitals: Temperature 100.3 pulse 82 respiratory rate 18 blood pressure 108/54 saturations 98%  Ventilator Settings mode of ventilation assist control FiO2 28% tidal volume 542 PEEP 5 FiO2 28%  . General: Comfortable at this time . Eyes: Grossly normal lids, irises & conjunctiva . ENT: grossly tongue is normal . Neck: no obvious mass . Cardiovascular: S1 S2 normal no gallop . Respiratory: No rhonchi no rales are noted at this time. . Abdomen: soft . Skin: no rash seen on limited exam . Musculoskeletal: not rigid . Psychiatric:unable to assess . Neurologic: no seizure no involuntary movements         Lab Data:   Basic Metabolic Panel: Recent Labs  Lab 05/03/18 0517 05/04/18 0553 05/05/18 0956 05/06/18 0525 05/07/18 0704 05/08/18 0715  NA 158* 154* 150* 150* 151* 148*  K 4.4  --  4.9 4.4 3.7 3.4*  CL 128*  --  122* 121* 119* 113*  CO2 19*  --  18* 19* 23 23  GLUCOSE 137*  --  108* 183* 158* 187*  BUN 85*  --  90* 96* 92* 85*  CREATININE 2.53*  --  2.84* 3.00* 2.99* 2.72*  CALCIUM 8.1*  --  7.9* 7.6* 7.9* 7.8*    ABG: Recent Labs  Lab 05/05/18 1140 05/05/18 1330 05/06/18 1226 05/07/18 1015  PHART 7.084* 7.210* 7.273* 7.327*  PCO2ART 64.7* 45.3 40.6 40.4  PO2ART 68.6* 109* 163* 147*  HCO3 18.5* 17.4* 18.3* 20.5  O2SAT 90.0 98.1 99.2 99.2    Liver Function Tests: No results for input(s): AST, ALT, ALKPHOS, BILITOT,  PROT, ALBUMIN in the last 168 hours. No results for input(s): LIPASE, AMYLASE in the last 168 hours. No results for input(s): AMMONIA in the last 168 hours.  CBC: Recent Labs  Lab 05/03/18 0517 05/05/18 0956 05/06/18 0525 05/08/18 0715  WBC 7.1 6.5 7.8 7.6  NEUTROABS  --  4.7  --   --   HGB 7.8* 6.6* 8.0* 7.4*  HCT 28.2* 24.5* 27.6* 25.4*  MCV 90.1 93.2 89.0 87.6  PLT 117* 148* 202 195    Cardiac Enzymes: No results for input(s): CKTOTAL, CKMB, CKMBINDEX, TROPONINI in the last 168 hours.  BNP (last 3 results) No results for input(s): BNP in the last 8760 hours.  ProBNP (last 3 results) No results for input(s): PROBNP in the last 8760 hours.  Radiological Exams: Dg Chest Port 1 View  Result Date: 05/08/2018 CLINICAL DATA:  Acute on chronic respiratory failure EXAM: PORTABLE CHEST 1 VIEW COMPARISON:  Single-view of the chest 05/05/2018 and 04/26/2018. FINDINGS: Tracheostomy tube and right PICC are unchanged since the most recent exam. There is cardiomegaly. Aortic atherosclerosis is noted. Left basilar airspace disease is unchanged. The right lung appears clear. No pneumothorax. IMPRESSION: No change in the appearance of left basilar airspace disease which could be due to atelectasis or pneumonia. Electronically Signed   By: Drusilla Kanner M.D.   On: 05/08/2018 08:54  Assessment/Plan Principal Problem:   Acute on chronic respiratory failure with hypoxia (HCC) Active Problems:   AKI (acute kidney injury) (HCC)   Chronic diastolic congestive heart failure (HCC)   Tracheostomy dependence (HCC)   Severe sepsis (HCC)   Hypernatremia   1. Acute on chronic respiratory failure with hypoxia patient still has abnormal chest x-ray with increased what appears to be fluid and congestion.  Sodium is also elevated.  Would consider CT of the chest for further evaluation of the lung parenchyma. 2. Acute renal failure creatinine has been waxing waning need to find fine balance between  his fluid status and his creatinine and sodium 3. Chronic diastolic heart failure no recent echo on file would consider doing a follow-up echo last echo was done in 2016 4. Severe sepsis right now pressures are stable we will continue present management. 5. Hypernatremia seems to be a little bit better however as noted above chest x-ray still showing some fluid overload   I have personally seen and evaluated the patient, evaluated laboratory and imaging results, formulated the assessment and plan and placed orders. The Patient requires high complexity decision making for assessment and support.  Case was discussed on Rounds with the Respiratory Therapy Staff time 35 minutes extended review of the medical record and discussion with the primary care team  Yevonne Pax, MD [facility] Pulmonary Critical Care Medicine Sleep Medicine

## 2018-05-08 DEATH — deceased

## 2018-05-09 DIAGNOSIS — J9621 Acute and chronic respiratory failure with hypoxia: Secondary | ICD-10-CM | POA: Diagnosis not present

## 2018-05-09 DIAGNOSIS — N179 Acute kidney failure, unspecified: Secondary | ICD-10-CM | POA: Diagnosis not present

## 2018-05-09 DIAGNOSIS — E870 Hyperosmolality and hypernatremia: Secondary | ICD-10-CM | POA: Diagnosis not present

## 2018-05-09 DIAGNOSIS — I5032 Chronic diastolic (congestive) heart failure: Secondary | ICD-10-CM | POA: Diagnosis not present

## 2018-05-09 NOTE — Progress Notes (Signed)
Pulmonary Critical Care Medicine [facility] [place]   PULMONARY CRITICAL CARE SERVICE  PROGRESS NOTE  Date of Service: 05/09/2018  [name]  QMV:[id number]  DOB: 11-15-1950   DOA: 04/22/2018  Referring Physician: Carron Curie, MD  HPI: [name] is a 67 y.o. male seen for follow up of Acute on Chronic Respiratory Failure.  Patient continues on full vent support has been having issues with blood pressure.  Patient is requiring Levophed and is been slowly titrating.  Unfortunately he is starting to show signs of peripheral vascular issues secondary likely to the Levophed.  Medications: Reviewed on Rounds  Physical Exam:  Vitals: Temperature 100.9 pulse 87 respiratory rate 20 blood pressure 96/57 saturations 100%  Ventilator Settings mode of ventilation assist control FiO2 28% tidal volume 560 PEEP 5  . General: Comfortable at this time . Eyes: Grossly normal lids, irises & conjunctiva . ENT: grossly tongue is normal . Neck: no obvious mass . Cardiovascular: S1 S2 normal no gallop . Respiratory: Coarse rhonchi are noted bilaterally . Abdomen: soft . Skin: no rash seen on limited exam but does have some discoloration of the extremities . Musculoskeletal: not rigid . Psychiatric:unable to assess . Neurologic: no seizure no involuntary movements         Lab Data:   Basic Metabolic Panel: Recent Labs  Lab 05/03/18 0517 05/04/18 0553 05/05/18 0956 05/06/18 0525 05/07/18 0704 05/08/18 0715  NA 158* 154* 150* 150* 151* 148*  K 4.4  --  4.9 4.4 3.7 3.4*  CL 128*  --  122* 121* 119* 113*  CO2 19*  --  18* 19* 23 23  GLUCOSE 137*  --  108* 183* 158* 187*  BUN 85*  --  90* 96* 92* 85*  CREATININE 2.53*  --  2.84* 3.00* 2.99* 2.72*  CALCIUM 8.1*  --  7.9* 7.6* 7.9* 7.8*    ABG: Recent Labs  Lab 05/05/18 1140 05/05/18 1330 05/06/18 1226 05/07/18 1015  PHART 7.084* 7.210* 7.273* 7.327*  PCO2ART 64.7* 45.3 40.6 40.4  PO2ART 68.6* 109* 163* 147*  HCO3  18.5* 17.4* 18.3* 20.5  O2SAT 90.0 98.1 99.2 99.2    Liver Function Tests: No results for input(s): AST, ALT, ALKPHOS, BILITOT, PROT, ALBUMIN in the last 168 hours. No results for input(s): LIPASE, AMYLASE in the last 168 hours. No results for input(s): AMMONIA in the last 168 hours.  CBC: Recent Labs  Lab 05/03/18 0517 05/05/18 0956 05/06/18 0525 05/08/18 0715  WBC 7.1 6.5 7.8 7.6  NEUTROABS  --  4.7  --   --   HGB 7.8* 6.6* 8.0* 7.4*  HCT 28.2* 24.5* 27.6* 25.4*  MCV 90.1 93.2 89.0 87.6  PLT 117* 148* 202 195    Cardiac Enzymes: No results for input(s): CKTOTAL, CKMB, CKMBINDEX, TROPONINI in the last 168 hours.  BNP (last 3 results) No results for input(s): BNP in the last 8760 hours.  ProBNP (last 3 results) No results for input(s): PROBNP in the last 8760 hours.  Radiological Exams: Dg Chest Port 1 View  Result Date: 05/08/2018 CLINICAL DATA:  Acute on chronic respiratory failure EXAM: PORTABLE CHEST 1 VIEW COMPARISON:  Single-view of the chest 05/05/2018 and 04/26/2018. FINDINGS: Tracheostomy tube and right PICC are unchanged since the most recent exam. There is cardiomegaly. Aortic atherosclerosis is noted. Left basilar airspace disease is unchanged. The right lung appears clear. No pneumothorax. IMPRESSION: No change in the appearance of left basilar airspace disease which could be due to atelectasis or pneumonia. Electronically  Signed   By: Drusilla Kanner M.D.   On: 05/08/2018 08:54    Assessment/Plan Principal Problem:   Acute on chronic respiratory failure with hypoxia (HCC) Active Problems:   AKI (acute kidney injury) (HCC)   Chronic diastolic congestive heart failure (HCC)   Tracheostomy dependence (HCC)   Severe sepsis (HCC)   Hypernatremia   1. Acute on chronic respiratory failure with hypoxia continue with full vent support actually the oxygen requirements are not elevated at this time however patient is not able to wean. 2. Acute renal failure  continue with supportive care follow-up labs.  Severe sepsis hemodynamically compromised still requiring Levophed pressure right now was 96/57 try to wean the Levophed as tolerated. 3. Chronic diastolic heart failure continue with present management.  Last chest film showed basilar airspace disease still 4. Hypernatremia sodium was still elevated.  We will continue with supportive care 5. Tracheostomy we will continue with the tracheostomy and full vent support at this time   I have personally seen and evaluated the patient, evaluated laboratory and imaging results, formulated the assessment and plan and placed orders. The Patient requires high complexity decision making for assessment and support.  Case was discussed on Rounds with the Respiratory Therapy Staff time 35 minutes patient is critically ill in danger of cardiac arrest requiring pressors to maintain his pressure discussed on rounds with multidisciplinary team  Yevonne Pax, MD [facility] Pulmonary Critical Care Medicine Sleep Medicine

## 2018-05-10 DIAGNOSIS — N179 Acute kidney failure, unspecified: Secondary | ICD-10-CM | POA: Diagnosis not present

## 2018-05-10 DIAGNOSIS — I5032 Chronic diastolic (congestive) heart failure: Secondary | ICD-10-CM | POA: Diagnosis not present

## 2018-05-10 DIAGNOSIS — J9621 Acute and chronic respiratory failure with hypoxia: Secondary | ICD-10-CM | POA: Diagnosis not present

## 2018-05-10 DIAGNOSIS — E870 Hyperosmolality and hypernatremia: Secondary | ICD-10-CM | POA: Diagnosis not present

## 2018-05-10 NOTE — Progress Notes (Signed)
Pulmonary Critical Care Medicine [facility] [place]   PULMONARY CRITICAL CARE SERVICE  PROGRESS NOTE  Date of Service: 05/10/2018  [name]  [id number]  DOB: [birth year]   DOA: 05-21-2018  Referring Physician: Carron Curie, MD  HPI: [name] is a 67 y.o. male seen for follow up of Acute on Chronic Respiratory Failure.  Patient is currently on full support on assist control mode has been on 28% oxygen.  He is still on Levophed for his blood pressure issues.  Currently is on 15 mcg/min so far not able to wean off the ventilator.  Medications: Reviewed on Rounds  Physical Exam:  Vitals: Temperature 99.1 pulse 89 respiratory rate 16 blood pressure 105/60 saturations 98%  Ventilator Settings mode of ventilation assist control FiO2 20% tidal volume 577 PEEP 5  . General: Comfortable at this time . Eyes: Grossly normal lids, irises & conjunctiva . ENT: grossly tongue is normal . Neck: no obvious mass . Cardiovascular: S1 S2 normal no gallop . Respiratory: Coarse rhonchi are noted bilaterally . Abdomen: soft . Skin: no rash seen on limited exam . Musculoskeletal: not rigid . Psychiatric:unable to assess . Neurologic: no seizure no involuntary movements         Lab Data:   Basic Metabolic Panel: Recent Labs  Lab 05/04/18 0553 05/05/18 0956 05/06/18 0525 05/07/18 0704 05/08/18 0715  NA 154* 150* 150* 151* 148*  K  --  4.9 4.4 3.7 3.4*  CL  --  122* 121* 119* 113*  CO2  --  18* 19* 23 23  GLUCOSE  --  108* 183* 158* 187*  BUN  --  90* 96* 92* 85*  CREATININE  --  2.84* 3.00* 2.99* 2.72*  CALCIUM  --  7.9* 7.6* 7.9* 7.8*    ABG: Recent Labs  Lab 05/05/18 1140 05/05/18 1330 05/06/18 1226 05/07/18 1015  PHART 7.084* 7.210* 7.273* 7.327*  PCO2ART 64.7* 45.3 40.6 40.4  PO2ART 68.6* 109* 163* 147*  HCO3 18.5* 17.4* 18.3* 20.5  O2SAT 90.0 98.1 99.2 99.2    Liver Function Tests: No results for input(s): AST, ALT, ALKPHOS, BILITOT, PROT,  ALBUMIN in the last 168 hours. No results for input(s): LIPASE, AMYLASE in the last 168 hours. No results for input(s): AMMONIA in the last 168 hours.  CBC: Recent Labs  Lab 05/05/18 0956 05/06/18 0525 05/08/18 0715  WBC 6.5 7.8 7.6  NEUTROABS 4.7  --   --   HGB 6.6* 8.0* 7.4*  HCT 24.5* 27.6* 25.4*  MCV 93.2 89.0 87.6  PLT 148* 202 195    Cardiac Enzymes: No results for input(s): CKTOTAL, CKMB, CKMBINDEX, TROPONINI in the last 168 hours.  BNP (last 3 results) No results for input(s): BNP in the last 8760 hours.  ProBNP (last 3 results) No results for input(s): PROBNP in the last 8760 hours.  Radiological Exams: No results found.  Assessment/Plan Principal Problem:   Acute on chronic respiratory failure with hypoxia (HCC) Active Problems:   AKI (acute kidney injury) (HCC)   Chronic diastolic congestive heart failure (HCC)   Tracheostomy dependence (HCC)   Severe sepsis (HCC)   Hypernatremia   1. Acute on chronic respiratory failure with hypoxia patient will be continued with full vent support at this time.  Spontaneous breathing trial not good for weaning at this time.  Also patient still requiring support with his blood pressure with Levophed. 2. Acute renal failure creatinine slightly better at 2.7 to continue with supportive care.  Also acidosis seems to be  improving a little bit. 3. Chronic diastolic heart failure we will continue to monitor fluid status 4. Severe sepsis as mentioned hemodynamically requiring pressors to maintain his blood pressure with levo fed right now running at 15 my micrograms per minute 5. Hypernatremia continue to follow labs his last sodium was 148   I have personally seen and evaluated the patient, evaluated laboratory and imaging results, formulated the assessment and plan and placed orders. The Patient requires high complexity decision making for assessment and support.  Case was discussed on Rounds with the Respiratory Therapy Staff  time spent 35 minutes patient is critically ill in danger of cardiac arrest reviewed labs medical record and discuss with medical staff on rounds  Yevonne Pax, MD [facility] Pulmonary Critical Care Medicine Sleep Medicine

## 2018-05-11 DIAGNOSIS — E870 Hyperosmolality and hypernatremia: Secondary | ICD-10-CM | POA: Diagnosis not present

## 2018-05-11 DIAGNOSIS — N179 Acute kidney failure, unspecified: Secondary | ICD-10-CM | POA: Diagnosis not present

## 2018-05-11 DIAGNOSIS — I5032 Chronic diastolic (congestive) heart failure: Secondary | ICD-10-CM | POA: Diagnosis not present

## 2018-05-11 DIAGNOSIS — J9621 Acute and chronic respiratory failure with hypoxia: Secondary | ICD-10-CM | POA: Diagnosis not present

## 2018-05-11 LAB — CBC
HEMATOCRIT: 23.5 % — AB (ref 39.0–52.0)
HEMOGLOBIN: 6.7 g/dL — AB (ref 13.0–17.0)
MCH: 25.6 pg — AB (ref 26.0–34.0)
MCHC: 28.5 g/dL — AB (ref 30.0–36.0)
MCV: 89.7 fL (ref 78.0–100.0)
Platelets: 273 10*3/uL (ref 150–400)
RBC: 2.62 MIL/uL — ABNORMAL LOW (ref 4.22–5.81)
RDW: 23.7 % — AB (ref 11.5–15.5)
WBC: 8.8 10*3/uL (ref 4.0–10.5)

## 2018-05-11 LAB — BASIC METABOLIC PANEL
ANION GAP: 17 — AB (ref 5–15)
BUN: 102 mg/dL — AB (ref 8–23)
CO2: 19 mmol/L — AB (ref 22–32)
Calcium: 7.6 mg/dL — ABNORMAL LOW (ref 8.9–10.3)
Chloride: 109 mmol/L (ref 98–111)
Creatinine, Ser: 3.21 mg/dL — ABNORMAL HIGH (ref 0.61–1.24)
GFR calc Af Amer: 22 mL/min — ABNORMAL LOW (ref >60)
GFR, EST NON AFRICAN AMERICAN: 19 mL/min — AB (ref >60)
GLUCOSE: 169 mg/dL — AB (ref 70–99)
POTASSIUM: 4.4 mmol/L (ref 3.5–5.1)
Sodium: 145 mmol/L (ref 135–145)

## 2018-05-11 LAB — PREPARE RBC (CROSSMATCH)

## 2018-05-12 LAB — CULTURE, BLOOD (ROUTINE X 2)
CULTURE: NO GROWTH
Culture: NO GROWTH

## 2018-05-12 LAB — TYPE AND SCREEN
ABO/RH(D): O POS
ANTIBODY SCREEN: NEGATIVE
UNIT DIVISION: 0

## 2018-05-12 LAB — BPAM RBC
BLOOD PRODUCT EXPIRATION DATE: 201910022359
UNIT TYPE AND RH: 5100

## 2018-06-07 NOTE — Progress Notes (Signed)
Pulmonary Critical Care Medicine [facility] [place]   PULMONARY CRITICAL CARE SERVICE  PROGRESS NOTE  Date of Service: 2018-06-05  Zaahir Levan  [id number]  DOB: 13-Sep-1950   DOA: 04/17/2018  Referring Physician: Carron Curie, MD  HPI: Braxlee Viviani is a 67 y.o. male seen for follow up of Acute on Chronic Respiratory Failure.  Patient is not weaning remains critically ill his Levophed actually has gone up to 26 mics now.  He remains in assist control mode.  Hemodynamically he is not doing well  Medications: Reviewed on Rounds  Physical Exam:  Vitals: Temperature 98.4 pulse 81 respiratory rate 18 blood pressure 78/53 saturations 98%  Ventilator Settings mode of ventilation assist control FiO2 20% tidal volume 600 PEEP 5  . General: Comfortable at this time . Eyes: Grossly normal lids, irises & conjunctiva . ENT: grossly tongue is normal . Neck: no obvious mass . Cardiovascular: S1 S2 normal no gallop . Respiratory: Coarse rhonchi are noted down . Abdomen: soft . Skin: no rash seen on limited exam . Musculoskeletal: not rigid . Psychiatric:unable to assess . Neurologic: no seizure no involuntary movements         Lab Data:   Basic Metabolic Panel: Recent Labs  Lab 05/05/18 0956 05/06/18 0525 05/07/18 0704 05/08/18 0715 05-Jun-2018 1114  NA 150* 150* 151* 148* 145  K 4.9 4.4 3.7 3.4* 4.4  CL 122* 121* 119* 113* 109  CO2 18* 19* 23 23 19*  GLUCOSE 108* 183* 158* 187* 169*  BUN 90* 96* 92* 85* 102*  CREATININE 2.84* 3.00* 2.99* 2.72* 3.21*  CALCIUM 7.9* 7.6* 7.9* 7.8* 7.6*    ABG: Recent Labs  Lab 05/05/18 1140 05/05/18 1330 05/06/18 1226 05/07/18 1015  PHART 7.084* 7.210* 7.273* 7.327*  PCO2ART 64.7* 45.3 40.6 40.4  PO2ART 68.6* 109* 163* 147*  HCO3 18.5* 17.4* 18.3* 20.5  O2SAT 90.0 98.1 99.2 99.2    Liver Function Tests: No results for input(s): AST, ALT, ALKPHOS, BILITOT, PROT, ALBUMIN in the last 168 hours. No results for input(s):  LIPASE, AMYLASE in the last 168 hours. No results for input(s): AMMONIA in the last 168 hours.  CBC: Recent Labs  Lab 05/05/18 0956 05/06/18 0525 05/08/18 0715 2018-06-05 1114  WBC 6.5 7.8 7.6 8.8  NEUTROABS 4.7  --   --   --   HGB 6.6* 8.0* 7.4* 6.7*  HCT 24.5* 27.6* 25.4* 23.5*  MCV 93.2 89.0 87.6 89.7  PLT 148* 202 195 273    Cardiac Enzymes: No results for input(s): CKTOTAL, CKMB, CKMBINDEX, TROPONINI in the last 168 hours.  BNP (last 3 results) No results for input(s): BNP in the last 8760 hours.  ProBNP (last 3 results) No results for input(s): PROBNP in the last 8760 hours.  Radiological Exams: No results found.  Assessment/Plan Principal Problem:   Acute on chronic respiratory failure with hypoxia (HCC) Active Problems:   AKI (acute kidney injury) (HCC)   Chronic diastolic congestive heart failure (HCC)   Tracheostomy dependence (HCC)   Severe sepsis (HCC)   Hypernatremia   1. Acute on chronic respiratory failure with hypoxia we will continue with full vent support.  Patient is unlikely to wean at this stage continue to monitor his mechanics. 2. Acute renal failure creatinine is worsened today.  Consider nephrology evaluation. 3. Chronic diastolic heart failure monitor fluid status patient is still not doing well despite aggressive management. 4. Severe sepsis as mentioned above patient remains on Levophed and it was increased to 26 mics. 5.  Hypernatremia is actually better today at 145 we will continue with supportive care  Patient is critically ill in danger of cardiac arrest and death requires ongoing aggressive monitoring of hemodynamics.   I have personally seen and evaluated the patient, evaluated laboratory and imaging results, formulated the assessment and plan and placed orders. The Patient requires high complexity decision making for assessment and support.  Case was discussed on Rounds with the Respiratory Therapy Staff critical care time 35  minutes discussion with the respiratory staff and medical staff during rounds   Yevonne Pax, MD [facility] Pulmonary Critical Care Medicine Sleep Medicine

## 2018-06-07 DEATH — deceased
